# Patient Record
Sex: Male | Born: 1945
Health system: Southern US, Community
[De-identification: ages and names within clinical notes are randomized; demographics above are authoritative.]

## PROBLEM LIST (undated history)

## (undated) DIAGNOSIS — R0600 Dyspnea, unspecified: Secondary | ICD-10-CM

## (undated) DIAGNOSIS — Z720 Tobacco use: Secondary | ICD-10-CM

## (undated) DIAGNOSIS — C61 Malignant neoplasm of prostate: Secondary | ICD-10-CM

## (undated) DIAGNOSIS — N400 Enlarged prostate without lower urinary tract symptoms: Secondary | ICD-10-CM

## (undated) DIAGNOSIS — E7800 Pure hypercholesterolemia, unspecified: Secondary | ICD-10-CM

## (undated) DIAGNOSIS — K219 Gastro-esophageal reflux disease without esophagitis: Secondary | ICD-10-CM

## (undated) DIAGNOSIS — F1010 Alcohol abuse, uncomplicated: Secondary | ICD-10-CM

## (undated) DIAGNOSIS — C439 Malignant melanoma of skin, unspecified: Secondary | ICD-10-CM

## (undated) DIAGNOSIS — I209 Angina pectoris, unspecified: Secondary | ICD-10-CM

## (undated) DIAGNOSIS — D494 Neoplasm of unspecified behavior of bladder: Secondary | ICD-10-CM

## (undated) DIAGNOSIS — Z8739 Personal history of other diseases of the musculoskeletal system and connective tissue: Secondary | ICD-10-CM

## (undated) DIAGNOSIS — I219 Acute myocardial infarction, unspecified: Secondary | ICD-10-CM

## (undated) DIAGNOSIS — I10 Essential (primary) hypertension: Secondary | ICD-10-CM

## (undated) DIAGNOSIS — I2510 Atherosclerotic heart disease of native coronary artery without angina pectoris: Secondary | ICD-10-CM

## (undated) DIAGNOSIS — J449 Chronic obstructive pulmonary disease, unspecified: Secondary | ICD-10-CM

## (undated) HISTORY — PX: CORONARY ANGIOPLASTY: SHX604

## (undated) HISTORY — DX: Gastro-esophageal reflux disease without esophagitis: K21.9

## (undated) HISTORY — DX: Atherosclerotic heart disease of native coronary artery without angina pectoris: I25.10

## (undated) HISTORY — PX: CARDIAC SURGERY: SHX584

## (undated) HISTORY — PX: PROSTATE SURGERY: SHX751

## (undated) HISTORY — PX: CORONARY ARTERY BYPASS GRAFT: SHX141

## (undated) HISTORY — DX: Alcohol abuse, uncomplicated: F10.10

## (undated) HISTORY — DX: Malignant melanoma of skin, unspecified: C43.9

## (undated) HISTORY — DX: Essential (primary) hypertension: I10

## (undated) HISTORY — DX: Malignant neoplasm of prostate: C61

## (undated) HISTORY — DX: Tobacco use: Z72.0

## (undated) HISTORY — DX: Pure hypercholesterolemia, unspecified: E78.00

## (undated) HISTORY — DX: Acute myocardial infarction, unspecified: I21.9

## (undated) HISTORY — PX: BACK SURGERY: SHX140

## (undated) HISTORY — PX: COLONOSCOPY: SHX174

---

## 1998-01-20 ENCOUNTER — Ambulatory Visit (HOSPITAL_COMMUNITY): Admission: RE | Admit: 1998-01-20 | Discharge: 1998-01-20 | Payer: Self-pay | Admitting: Neurosurgery

## 1999-02-04 ENCOUNTER — Encounter: Payer: Self-pay | Admitting: Emergency Medicine

## 1999-02-04 ENCOUNTER — Inpatient Hospital Stay (HOSPITAL_COMMUNITY): Admission: EM | Admit: 1999-02-04 | Discharge: 1999-02-11 | Payer: Self-pay | Admitting: Emergency Medicine

## 1999-02-05 ENCOUNTER — Encounter: Payer: Self-pay | Admitting: Cardiology

## 1999-02-07 ENCOUNTER — Encounter: Payer: Self-pay | Admitting: Thoracic Surgery (Cardiothoracic Vascular Surgery)

## 1999-02-08 ENCOUNTER — Encounter: Payer: Self-pay | Admitting: Thoracic Surgery (Cardiothoracic Vascular Surgery)

## 1999-02-09 ENCOUNTER — Encounter: Payer: Self-pay | Admitting: Thoracic Surgery (Cardiothoracic Vascular Surgery)

## 2005-06-11 ENCOUNTER — Observation Stay (HOSPITAL_COMMUNITY): Admission: EM | Admit: 2005-06-11 | Discharge: 2005-06-12 | Payer: Self-pay | Admitting: Emergency Medicine

## 2006-11-08 ENCOUNTER — Ambulatory Visit: Payer: Self-pay | Admitting: Gastroenterology

## 2006-11-08 LAB — CONVERTED CEMR LAB
INR: 0.9 (ref 0.9–2.0)
Prothrombin Time: 11.7 s (ref 10.0–14.0)
aPTT: 35.7 s (ref 26.5–36.5)

## 2006-11-19 ENCOUNTER — Ambulatory Visit: Payer: Self-pay | Admitting: Gastroenterology

## 2007-02-04 ENCOUNTER — Ambulatory Visit (HOSPITAL_BASED_OUTPATIENT_CLINIC_OR_DEPARTMENT_OTHER): Admission: RE | Admit: 2007-02-04 | Discharge: 2007-02-04 | Payer: Self-pay | Admitting: Surgery

## 2007-02-04 ENCOUNTER — Encounter (INDEPENDENT_AMBULATORY_CARE_PROVIDER_SITE_OTHER): Payer: Self-pay | Admitting: Surgery

## 2008-03-03 ENCOUNTER — Ambulatory Visit (HOSPITAL_COMMUNITY): Admission: RE | Admit: 2008-03-03 | Discharge: 2008-03-03 | Payer: Self-pay | Admitting: Cardiology

## 2008-03-03 ENCOUNTER — Ambulatory Visit: Payer: Self-pay | Admitting: Cardiology

## 2008-04-01 ENCOUNTER — Ambulatory Visit: Payer: Self-pay | Admitting: Cardiology

## 2008-05-26 ENCOUNTER — Ambulatory Visit: Admission: RE | Admit: 2008-05-26 | Discharge: 2008-08-19 | Payer: Self-pay | Admitting: Radiation Oncology

## 2008-07-13 ENCOUNTER — Ambulatory Visit (HOSPITAL_BASED_OUTPATIENT_CLINIC_OR_DEPARTMENT_OTHER): Admission: RE | Admit: 2008-07-13 | Discharge: 2008-07-13 | Payer: Self-pay | Admitting: Urology

## 2008-10-06 ENCOUNTER — Encounter (HOSPITAL_COMMUNITY): Admission: RE | Admit: 2008-10-06 | Discharge: 2008-12-11 | Payer: Self-pay | Admitting: Family Medicine

## 2008-12-29 ENCOUNTER — Ambulatory Visit: Admission: RE | Admit: 2008-12-29 | Discharge: 2008-12-29 | Payer: Self-pay | Admitting: Radiation Oncology

## 2008-12-29 LAB — URINALYSIS, MICROSCOPIC - CHCC
Bilirubin (Urine): NEGATIVE
Blood: NEGATIVE
Glucose: NEGATIVE g/dL
Ketones: NEGATIVE mg/dL
Leukocyte Esterase: NEGATIVE
Nitrite: NEGATIVE
Protein: NEGATIVE mg/dL
RBC count: NEGATIVE (ref 0–2)
Specific Gravity, Urine: 1.005 (ref 1.003–1.035)
pH: 6 (ref 4.6–8.0)

## 2008-12-31 LAB — URINE CULTURE

## 2009-08-07 HISTORY — PX: CARDIAC CATHETERIZATION: SHX172

## 2009-11-23 ENCOUNTER — Inpatient Hospital Stay (HOSPITAL_COMMUNITY): Admission: EM | Admit: 2009-11-23 | Discharge: 2009-11-25 | Payer: Self-pay | Admitting: Emergency Medicine

## 2009-11-23 ENCOUNTER — Ambulatory Visit: Payer: Self-pay | Admitting: Cardiology

## 2009-12-03 DIAGNOSIS — E782 Mixed hyperlipidemia: Secondary | ICD-10-CM | POA: Insufficient documentation

## 2009-12-03 DIAGNOSIS — F17200 Nicotine dependence, unspecified, uncomplicated: Secondary | ICD-10-CM | POA: Insufficient documentation

## 2009-12-03 DIAGNOSIS — F1010 Alcohol abuse, uncomplicated: Secondary | ICD-10-CM | POA: Insufficient documentation

## 2009-12-03 DIAGNOSIS — Z720 Tobacco use: Secondary | ICD-10-CM | POA: Insufficient documentation

## 2009-12-03 DIAGNOSIS — E785 Hyperlipidemia, unspecified: Secondary | ICD-10-CM | POA: Insufficient documentation

## 2009-12-06 ENCOUNTER — Ambulatory Visit: Payer: Self-pay | Admitting: Cardiology

## 2009-12-06 DIAGNOSIS — I498 Other specified cardiac arrhythmias: Secondary | ICD-10-CM | POA: Insufficient documentation

## 2010-09-08 NOTE — Assessment & Plan Note (Signed)
Summary: EPH/ GD  Medications Added SIMCOR 750-20 MG XR24H-TAB (NIACIN-SIMVASTATIN) 1 tab at bedtime FLOMAX 0.4 MG CAPS (TAMSULOSIN HCL) 1 cap once daily INDOMETHACIN 50 MG CAPS (INDOMETHACIN) 1 cap three times a day      Allergies Added: NKDA  Visit Type:  EPH Primary Provider:  Ileana [name]  CC:  pt states he gets dizzy when he gets up...denies any other complaints today.  History of Present Illness: [name] comes in today for followup after being discharged the hospital.  He came in with chest pain with a history of an inferior [name] infarct and also coronary bypass grafting.  He ruled out for myocardial infarction. Cardiac catheterization showed patent grafts. He has good LV function.  Since discharge he had no further chest pain. He does get dizzy when he stands up. He is cut back his tobacco use and alcohol use significantly since discharge.  Clinical Reports Reviewed:  Cardiac Cath:  11/25/2009: Cardiac Cath Findings:   Left ventricular angiogram was performed in the RAO projection and       showed normal left ventricular systolic function with ejection       fraction estimated at 50%.  Mild mitral regurgitation was noted.   10.Aortic root angiogram was performed and did not show enlargement of       the aortic root.      IMPRESSION:   1. Severe triple-vessel coronary artery disease.   2. Patent bypass grafts 4/4.   3. Low normal left ventricular systolic function.      RECOMMENDATIONS:  I recommend continued medical management.  We should   also encourage complete smoking cessation.               Ralph Carrow, MD   06/12/2005: Cardiac Cath Findings:   The left ventricular angiography was performed in the RAO view. This  demonstrates normal left ventricular size. There is hypokinesia of the basal  inferior [name] with overall well-preserved left ventricular function.  Ejection fraction is estimated 55%. There is no mitral regurgitation   prolapse.   FINAL INTERPRETATION:  1.  Severe two-vessel obstructive atherosclerotic coronary disease.  2.  Continued excellent patency of all his bypass grafts.  3.  Good left ventricular function.   PLAN:  Based on these findings, I think that his chest pain is noncardiac.  Recommend continued medical therapy and smoking cessation.         ______________________________  [name], M.D.   Allergies (verified): No Known Drug Allergies  Past History:  Past Medical History: Last updated: 2009-12-13 CAD, ARTERY BYPASS GRAFT/ 2001 (ICD-414.04) MYOCARDIAL INFARCTION, INFERIOR [name]/ 1997 (ICD-410.40) HYPERLIPIDEMIA (ICD-272.4) HYPERTENSION (ICD-401.9) TOBACCO USER (ICD-305.1) ETHANOL ABUSE (ICD-305.00)    Past Surgical History: Last updated: 12/13/09 Prostate seed implantation and cystoscopy.  Internal hemorrhoidal banding at 12 o'clock.  Excision of 2 areas of external hemorrhoid skin tags.      Family History: Last updated: 12-13-2009  His father died at 73 of an MI.  Two brothers have   cardiac issues.   Social History: Last updated: 12-13-09  He just retired from [employer]  in January of this year.   He is married, has 2 children.  He smokes, he drinks as mentioned above.  Enjoys hunting.   Review of Systems       negative other than history of present illness  Vital Signs:  Patient profile:   65 year old male Height:      67 inches Weight:  157 pounds BMI:     24.68 Pulse rate:   48 / minute Pulse (ortho):   50 / minute Pulse rhythm:   irregular BP sitting:   118 / 60  (left arm) BP standing:   122 / 66 Cuff size:   large  Vitals Entered By: [name], CMA (Dec 06, 2009 3:45 PM)  Serial Vital Signs/Assessments:  Time      Position  BP       Pulse  Resp  Temp     By 3:56 PM   Lying LA  135/66   47                    [name], CMA 3:58 PM   Sitting   128/67   48                    [name], CMA 4:00 PM   Standing  122/66   50                     [name], CMA 4:02 PM   Standing  131/66   51                    [name], [place] 4:04 PM   Standing  129/65   51                    [name], [place]  Comments: 3:56 PM no sxms By: [name], CMA  3:58 PM no sxms By: [name], CMA  4:00 PM no sxms By: [name], CMA  4:02 PM no sxms By: [name], CMA  4:04 PM no sxms By: [name], CMA    Physical Exam  General:  looks older than stated age, pleasant, alert oriented x3 Head:  normocephalic and atraumatic Neck:  Neck supple, no JVD. No masses, thyromegaly or abnormal cervical nodes. Chest [name]:  no deformities or breast masses noted Lungs:  Clear bilaterally to auscultation and percussion. Heart:  Non-displaced PMI, chest non-tender; regular rate and rhythm, S1, S2 without murmurs, rubs or gallops. Carotid upstroke normal, no bruit. Normal abdominal aortic size, no bruits. Femorals normal pulses, no bruits. Pedals normal pulses. No edema, no varicosities. Msk:  Back normal, normal gait. Muscle strength and tone normal. Pulses:  pulses normal in all 4 extremities Extremities:  No clubbing or cyanosis. Neurologic:  Alert and oriented x 3. Skin:  Intact without lesions or rashes. Psych:  Normal affect.   Problems:  Medical Problems Added: 1)  Dx of Bradycardia  (ICD-427.89)  EKG  Procedure date:  12/06/2009  Findings:      sinus bradycardia, rate 48 beats per minute, previous inferior [name] infarct.  Impression & Recommendations:  Problem # 1:  CAD, ARTERY BYPASS GRAFT/ 2001 (ICD-414.04) Assessment Unchanged  It is remarkable for all his bypass grafts are open. Have encouraged him to cut back on his smoking or quit which I doubt he can and to cut back on his alcohol His updated medication list for this problem includes:    Aspirin 81 Mg Tbec (Aspirin) .Marland Kitchen... Take one tablet by mouth daily    Metoprolol Tartrate 50 Mg Tabs (Metoprolol tartrate) .Marland Kitchen... 1 tab two times a day     Ramipril 5 Mg Caps (Ramipril) .Marland Kitchen... 1 cap once daily  Orders: EKG w/ Interpretation (93000)  Problem # 2:  MYOCARDIAL INFARCTION, INFERIOR [name]/ 1997 (ICD-410.40) Assessment: Unchanged  His updated medication list for this problem includes:    Aspirin 81 Mg Tbec (Aspirin) .Marland Kitchen... Take one tablet by mouth daily    Metoprolol Tartrate 50 Mg Tabs (Metoprolol tartrate) .Marland Kitchen... 1 tab two times a day    Ramipril 5 Mg Caps (Ramipril) .Marland Kitchen... 1 cap once daily  Problem # 3:  HYPERLIPIDEMIA (ICD-272.4)  The following medications were removed from the medication list:    Niaspan 1000 Mg Cr-tabs (Niacin (antihyperlipidemic)) .Marland Kitchen... 1 1/2 tab at bedtime His updated medication list for this problem includes:    Simvastatin 40 Mg Tabs (Simvastatin) .Marland Kitchen... 1 tab at bedtime    Lovaza 1 Gm Caps (Omega-3-acid ethyl esters) .Marland Kitchen... 2 caps two times a day    Simcor 750-20 Mg Xr24h-tab (Niacin-simvastatin) .Marland Kitchen... 1 tab at bedtime  Problem # 4:  HYPERTENSION (ICD-401.9) Assessment: Improved  His updated medication list for this problem includes:    Aspirin 81 Mg Tbec (Aspirin) .Marland Kitchen... Take one tablet by mouth daily    Metoprolol Tartrate 50 Mg Tabs (Metoprolol tartrate) .Marland Kitchen... 1 tab two times a day    Ramipril 5 Mg Caps (Ramipril) .Marland Kitchen... 1 cap once daily  Problem # 5:  TOBACCO USER (ICD-305.1) Assessment: Improved  Problem # 6:  ETHANOL ABUSE (ICD-305.00) Assessment: Improved  Problem # 7:  BRADYCARDIA (ICD-427.89)  I have cut his metoprolol to 25 mg twice a day. I've also encouraged him to cut back on his alcohol and increase free water so that he won't be dizzy when he stands up. His updated medication list for this problem includes:    Aspirin 81 Mg Tbec (Aspirin) .Marland Kitchen... Take one tablet by mouth daily    Metoprolol Tartrate 50 Mg Tabs (Metoprolol tartrate) .Marland Kitchen... 1 tab two times a day    Ramipril 5 Mg Caps (Ramipril) .Marland Kitchen... 1 cap once daily  His updated medication list for this problem includes:    Aspirin 81  Mg Tbec (Aspirin) .Marland Kitchen... Take one tablet by mouth daily    Metoprolol Tartrate 50 Mg Tabs (Metoprolol tartrate) .Marland Kitchen... 1 tab two times a day    Ramipril 5 Mg Caps (Ramipril) .Marland Kitchen... 1 cap once daily  Patient Instructions: 1)  Your physician recommends that you schedule a follow-up appointment in: YEAR WITH DR Sachit Gilman 2)  Your physician has recommended you make the following change in your medication: DECREASE METOPROLOL TO 25 MG two times a day

## 2010-10-25 LAB — POCT CARDIAC MARKERS
CKMB, poc: <1 ng/mL — ABNORMAL LOW (ref 1.0–8.0)
Myoglobin, poc: 86.8 ng/mL (ref 12–200)
Troponin i, poc: <0.05 ng/mL (ref 0.00–0.09)

## 2010-10-25 LAB — APTT: aPTT: 33 seconds (ref 24–37)

## 2010-10-25 LAB — CK TOTAL AND CKMB (NOT AT ARMC)
CK, MB: 1.1 ng/mL (ref 0.3–4.0)
Relative Index: INVALID (ref 0.0–2.5)
Total CK: 66 U/L (ref 7–232)

## 2010-10-25 LAB — BASIC METABOLIC PANEL
BUN: 11 mg/dL (ref 6–23)
BUN: 9 mg/dL (ref 6–23)
CO2: 26 mEq/L (ref 19–32)
CO2: 28 mEq/L (ref 19–32)
Calcium: 10.2 mg/dL (ref 8.4–10.5)
Calcium: 9.5 mg/dL (ref 8.4–10.5)
Chloride: 104 mEq/L (ref 96–112)
Chloride: 104 mEq/L (ref 96–112)
Creatinine, Ser: 1.13 mg/dL (ref 0.4–1.5)
Creatinine, Ser: 1.34 mg/dL (ref 0.4–1.5)
GFR calc Af Amer: >60 mL/min (ref >60)
GFR calc Af Amer: >60 mL/min (ref >60)
GFR calc non Af Amer: 54 mL/min — ABNORMAL LOW (ref >60)
GFR calc non Af Amer: >60 mL/min (ref >60)
Glucose, Bld: 108 mg/dL — ABNORMAL HIGH (ref 70–99)
Glucose, Bld: 93 mg/dL (ref 70–99)
Potassium: 4.4 mEq/L (ref 3.5–5.1)
Potassium: 5.1 mEq/L (ref 3.5–5.1)
Sodium: 137 mEq/L (ref 135–145)
Sodium: 138 mEq/L (ref 135–145)

## 2010-10-25 LAB — PROTIME-INR
INR: 1.04 (ref 0.00–1.49)
Prothrombin Time: 13.5 seconds (ref 11.6–15.2)

## 2010-10-25 LAB — CBC
HCT: 36.1 % — ABNORMAL LOW (ref 39.0–52.0)
HCT: 36.6 % — ABNORMAL LOW (ref 39.0–52.0)
HCT: 40.2 % (ref 39.0–52.0)
Hemoglobin: 12.3 g/dL — ABNORMAL LOW (ref 13.0–17.0)
Hemoglobin: 12.6 g/dL — ABNORMAL LOW (ref 13.0–17.0)
Hemoglobin: 13.9 g/dL (ref 13.0–17.0)
MCHC: 34 g/dL (ref 30.0–36.0)
MCHC: 34.3 g/dL (ref 30.0–36.0)
MCHC: 34.6 g/dL (ref 30.0–36.0)
MCV: 100.1 fL — ABNORMAL HIGH (ref 78.0–100.0)
MCV: 99.6 fL (ref 78.0–100.0)
MCV: 99.7 fL (ref 78.0–100.0)
Platelets: 168 10*3/uL (ref 150–400)
Platelets: 181 10*3/uL (ref 150–400)
Platelets: 206 10*3/uL (ref 150–400)
RBC: 3.62 MIL/uL — ABNORMAL LOW (ref 4.22–5.81)
RBC: 3.65 MIL/uL — ABNORMAL LOW (ref 4.22–5.81)
RBC: 4.04 MIL/uL — ABNORMAL LOW (ref 4.22–5.81)
RDW: 13.3 % (ref 11.5–15.5)
RDW: 13.4 % (ref 11.5–15.5)
RDW: 13.4 % (ref 11.5–15.5)
WBC: 10 10*3/uL (ref 4.0–10.5)
WBC: 7.2 10*3/uL (ref 4.0–10.5)
WBC: 9.7 10*3/uL (ref 4.0–10.5)

## 2010-10-25 LAB — HEPARIN LEVEL (UNFRACTIONATED)
Heparin Unfractionated: 0.21 IU/mL — ABNORMAL LOW (ref 0.30–0.70)
Heparin Unfractionated: 0.23 IU/mL — ABNORMAL LOW (ref 0.30–0.70)

## 2010-10-25 LAB — TROPONIN I: Troponin I: 0.01 ng/mL (ref 0.00–0.06)

## 2010-10-25 LAB — CARDIAC PANEL(CRET KIN+CKTOT+MB+TROPI)
CK, MB: 0.9 ng/mL (ref 0.3–4.0)
CK, MB: 1 ng/mL (ref 0.3–4.0)
Relative Index: INVALID (ref 0.0–2.5)
Relative Index: INVALID (ref 0.0–2.5)
Total CK: 62 U/L (ref 7–232)
Total CK: 62 U/L (ref 7–232)
Troponin I: 0.01 ng/mL (ref 0.00–0.06)
Troponin I: 0.02 ng/mL (ref 0.00–0.06)

## 2010-10-25 LAB — DIFFERENTIAL
Basophils Absolute: 0 10*3/uL (ref 0.0–0.1)
Basophils Relative: 0 % (ref 0–1)
Eosinophils Absolute: 0 10*3/uL (ref 0.0–0.7)
Eosinophils Relative: 0 % (ref 0–5)
Lymphocytes Relative: 15 % (ref 12–46)
Lymphs Abs: 1.5 10*3/uL (ref 0.7–4.0)
Monocytes Absolute: 0.7 10*3/uL (ref 0.1–1.0)
Monocytes Relative: 7 % (ref 3–12)
Neutro Abs: 7.8 10*3/uL — ABNORMAL HIGH (ref 1.7–7.7)
Neutrophils Relative %: 78 % — ABNORMAL HIGH (ref 43–77)

## 2010-10-25 LAB — TSH: TSH: 0.595 u[IU]/mL (ref 0.350–4.500)

## 2010-12-20 NOTE — Letter (Signed)
March 03, 2008    Ernestina Penna, M.D.  [address] [place], [place] [address]   RE:  [name], [name]  MRN:  [id number]  /  DOB:  July 28, 1946   Dear Roe Coombs,   [name] was seen in the office today in consultation at your request  for coronary artery disease.  As you know, this nice gentleman underwent  percutaneous intervention for an acute inferior myocardial infarction in  1997, but subsequently required CABG surgery in 2001.  He was previously  under the care of Dr. Peter Swaziland.  He required repeat coronary  angiography in 2006 at which time he was found to have total obstruction  of the mid LAD, total obstruction of the mid right coronary, and  scattered other lesions.  Saphenous vein grafts to the PDA, first  marginal and first diagonal were patent as was the LIMA graft to the  LAD.  He had basilar inferior hypokinesis with normal overall left  ventricular systolic function.   He reports no chest discomfort.  He does have exertional dyspnea when  going up a hill.  He believes this has been progressive in recent years,  but attributes it to cigarette smoking.  He has a 40-pack-a-year total  consumption, discontinued for a few years, but has resumed more  recently.  He has hyperlipidemia that [name] is controlling  aggressively.  Hypertension is adequately controlled.   Current medications include:  1. Aspirin 81 mg daily.  2. Metoprolol 50 mg b.i.d.  3. Simvastatin/Niaspan 1500/40 mg daily.  4. Lovaza 2 tablets b.i.d.  5. Ramipril 5 mg daily.   [name] has no known medical allergies.   SOCIAL HISTORY:  He works at [employer]; enjoys hunting; married, with 2  adult children.  No excessive use of alcohol.   FAMILY HISTORY:  Father died at age 29 due to myocardial infarction.  Mother had diabetes and died at a young age.  Two brothers both have  cardiac problems.   REVIEW OF SYSTEMS:  Notable for the need for corrective lenses for near  vision, developing cataracts, mild  hearing loss, upper dentures, and  intermittent gout.  All other systems reviewed and are negative.   PHYSICAL EXAMINATION:  GENERAL:  A pleasant gentleman in no acute  distress.  VITAL SIGNS:  The weight is 143, blood pressure 130/70, heart rate 55  and regular, and respirations 14.  HEENT:  Anicteric sclerae; normal lids and conjunctivae; normal oral  mucosa.  NECK:  No jugular venous distention; normal carotid upstrokes without  bruits.  LUNGS:  Inspiratory and expiratory rhonchi; somewhat prolonged  expiratory phase.  CARDIAC:  Normal first and second heart sounds.  ABDOMEN:  Soft and nontender; aortic pulsation not palpable; no bruits.  EXTREMITIES:  Distal pulses intact; no edema.   EKG:  Sinus bradycardia; left atrial abnormality; probable prior  inferior myocardial infarction; low voltage in the limb leads; prominent  voltage in the chest leads; delayed R-wave progression.  No prior  tracing for comparison.   [name] is doing well 8 years following CABG surgery.  Progressive  dyspnea merits further investigation.  We will perform a graded exercise  test with oximetric monitoring.  Control of hypertension is good, but  could always be better.  Ralph dose of ramipril will be increased to 10 mg  daily.  I do not have Ralph most recent lipid profile, but will leave  adjustment of medication to your discretion.  If cost becomes an  issue,  he could be switched to generic simvastatin and over-the-counter niacin.   Both [name] and [name] were strongly encouraged to discontinue  cigarette smoking.  He used chewing tobacco in the past as nicotine  replacement therapy.  I suggested the patch.  Assuming he does well on  Ralph stress test, I will plan to see this nice gentleman again in 6  months.    Sincerely,     Ralph Friends. Dietrich Pates, MD, [facility]  Electronically Signed   RMR/MedQ  DD: 03/03/2008  DT: 03/04/2008  Job #: [id number]

## 2010-12-20 NOTE — Op Note (Signed)
[name], [name]                 ACCOUNT NO.:  [id number]   MEDICAL RECORD NO.:  [id number]          PATIENT TYPE:  AMB   LOCATION:  [facility]                         FACILITY:  [facility]   PHYSICIAN:  Excell Seltzer. Annabell Howells, M.D.    DATE OF BIRTH:  May 07, 1946   DATE OF PROCEDURE:  07/13/2008  DATE OF DISCHARGE:                               OPERATIVE REPORT   PROCEDURE:  Prostate seed implantation and cystoscopy.   PREOPERATIVE DIAGNOSIS:  Stage T1c Gleason's 6 adenocarcinoma of the  prostate.   POSTOPERATIVE DIAGNOSIS:  Stage T1c Gleason's 6 adenocarcinoma of the  prostate.   SURGEON:  Excell Seltzer. Annabell Howells, M.D.   RADIATION ONCOLOGIST:  Billie Lade, M.D.   ANESTHESIA:  General.   DRAINS:  Foley catheter.   COMPLICATIONS:  None.   INDICATIONS:  [name] is a 65 year old white male with a Gleason's 6  prostate cancer who has elected to undergo seed implantation.  His  prostate volume is 22 mL.  He had low risk disease and felt to be a good  candidate for seeds   FINDINGS AND PROCEDURE:  The patient was given Cipro, was taken  operating room where he was fitted with PAS hose.  A general anesthetic  was induced.  He was placed in the lithotomy position.  His genitalia  was prepped with Betadine solution.  A Foley catheter was inserted.  The  balloon was filled with dilute contrast and an OpSite was used to  reflect the scrotum superiorly.  The patient's perineum was prepped with  Betadine solution.  The ultrasound grid was placed.  Stabilization  needles were placed E4 and H4 and then the intraoperative plan was  performed.  The perineum was clipped. A red rubber rectal catheter was  placed.  The ultrasound probe was assembled and inserted and the  intraoperative plan was performed by Dr. Roselind Messier and radiation physicist,  Burna Mortimer.  .  The intraoperative plan was performed on then nucleotron  device Dr. Roselind Messier and the radiation physicist.  Once the band plan had  been completed, I then returned  and placed the needles per the  prearranged plan.  A total of 26 needles with 74 seeds of iodine 125 was  used.  Once the implant had been completed,  the ultrasound probe was  removed and fluoroscopy was used to take images of the implant.  The  Foley catheter was then removed, the genitalia was reprepped.  Cystoscopy was performed with a 16-French flexible scope.  Examination  revealed a normal urethra.  The external sphincter was intact.  The  prostatic urethra short with bilobar hyperplasia with minimal  obstruction.  Examination of bladder revealed minimal trabeculation.  No  tumors or stones were noted.  No seeds were seen.  No clots were seen.  There was no active bleeding.  At this point the cystoscope was removed  and a fresh Foley catheter was inserted.  The balloon was filled with 10  mL sterile fluid and the catheter was placed to  straight drainage.  The patient's perineum was cleansed and dressed in  the red rubber rectal catheter was removed.  He was taken down from  lithotomy position.  His anesthetic was reversed.  He was removed to the  recovery room in stable condition and no complications.      Excell Seltzer. Annabell Howells, M.D.  Electronically Signed     JJW/MEDQ  D:  07/13/2008  T:  07/13/2008  Job:  [id number]   cc:   Ernestina Penna, M.D.  Fax: [phone]   Billie Lade, M.D.  Fax: [phone]   Gerrit Friends. Dietrich Pates, MD, [facility]  [address]  [place], [place] [address]

## 2010-12-20 NOTE — Op Note (Signed)
[name], [name]                 ACCOUNT NO.:  [id number]   MEDICAL RECORD NO.:  [id number]          PATIENT TYPE:  AMB   LOCATION:  [facility]                          FACILITY:  [facility]   PHYSICIAN:  Thornton Park. Daphine Deutscher, MD  DATE OF BIRTH:  11-11-1945   DATE OF PROCEDURE:  02/04/2007  DATE OF DISCHARGE:                               OPERATIVE REPORT   PREOPERATIVE DIAGNOSIS:  Bleeding hemorrhoids.   POSTOPERATIVE DIAGNOSIS:  Large internal hemorrhoid at 12 o'clock with  the patient in the supine position, likely source of bleeding, with  external hemorrhoids skin tag.   PROCEDURE:  1. Exam under anesthesia in the dorsal lithotomy position.  2. Internal hemorrhoidal banding at 12 o'clock.  3. Excision of 2 areas of external hemorrhoid skin tags.   SURGEON:  Thornton Park. Daphine Deutscher, MD   ANESTHESIA:  General.   DESCRIPTION OF PROCEDURE:  [name] was taken back to room #3 on February 04, 2007 and placed in the dorsal lithotomy position.  His perianal  region was prepped Techni-Care on the outside and on the inside and  draped sterilely.  With him in Valsalva, he had a very large strawberry-  like hemorrhoid in the 12 o'clock position which I was able to expose  easily and double-band internally; this caused marked regression of that  column.  I did find skin tags, however, which I then excised with a  Bovie, both at the 11 o'clock position and at the 4 o'clock position.  I  went ahead and controlled bleeding with the electrocautery.  I massaged  Betadine ointment into these areas.  The perianal region looked smooth  at the end and no other significant hemorrhoidal disease could be  identified at this point.  I injected the sphincter muscle with 20 mL of  0.25% Marcaine with epinephrine.  The patient was awakened and taken to  the recovery room in satisfactory condition.  He was given a  prescription for Tylox to take if needed for pain and he will be  followed up in the office in about 2-3  weeks.      Thornton Park Daphine Deutscher, MD  Electronically Signed     MBM/MEDQ  D:  02/04/2007  T:  02/05/2007  Job:  [id number]   cc:   Alfredia Client, MD

## 2010-12-23 NOTE — Discharge Summary (Signed)
[name], [name]                 ACCOUNT NO.:  [id number]   MEDICAL RECORD NO.:  [id number]          PATIENT TYPE:  INP   LOCATION:  3743                         FACILITY:  [facility]   PHYSICIAN:  [name], M.D.  DATE OF BIRTH:  10-06-1945   DATE OF ADMISSION:  06/11/2005  DATE OF DISCHARGE:  06/12/2005                                 DISCHARGE SUMMARY   HISTORY OF PRESENT ILLNESS:  [name] is a 65 year old white male with  history of prior inferolateral myocardial infarction 1997 treated with  direct angioplasty of the left circumflex coronary artery. He subsequently  had coronary bypass surgery in 2001. He presents with increasing symptoms of  chest pain. His pain was midsternal and seems to be worse with deep  breathing or movement but he also states it is similar to his prior heart  attack pain. The patient does continue to smoke. He has a history of  hypercholesterolemia and family history of coronary disease. For details of  his past medical history, social history, family history, physical exam,  please see admission history and physical.   LABORATORY:  His chest x-ray showed COPD without active disease. A pH 7.42,  pCO2 of 34, bicarbonate of 23, white count 6100, hemoglobin 12.1, hematocrit  36.6, platelets 253,000. Coags were normal. Sodium 138, potassium 3.7,  chloride 109, CO2 25, glucose of 84, BUN 5, creatinine was 1.1. LFT's were  all normal. Calcium was normal. Serial cardiac enzymes were negative x3.  Cholesterol was 200, triglyceride level 456, HDL 40. LDL could not be  calculated. This was a nonfasting sample. ECG showed evidence old inferior  infarction without acute change.   HOSPITAL COURSE:  The patient is admitted to telemetry. He was placed on IV  heparin. His chest pain resolved. It was noted the patient had recently been  doing heavy yard work including cutting and splitting wood and carrying  wood. Because of his symptoms, he did undergo cardiac  catheterization on  June 12, 2005. This showed severe two-vessel coronary disease with  occlusion of the mid-LAD and the mid-right coronary. All his grafts were  patent including saphenous vein graft to the PDA, saphenous vein graft to  the first obtuse marginal vessel,  saphenous vein graft to the first  diagonal branch, and LIMA graft to LAD. He had focal inferior wall  hypokinesia with overall ejection fraction of approximately 55%. Based on  these findings, we could not ascribe a cardiac cause to his symptoms. I  suspect his symptoms were predominant musculoskeletal due to his recent  straining. I have recommended use of nonsteroidal anti-inflammatory agents  and rest at this time. The patient was discharged the day of his cardiac  catheterization in stable condition.   Discharge medications remained the same. He will be on Advicor 20/1000  milligrams per day, Indocin 50 milligrams twice a day, Toprol XL 50  milligrams twice a day, aspirin 81 milligrams per day, nitroglycerin p.r.n.   DISCHARGE DIAGNOSES:  1.  Chest pain. Musculoskeletal.  2.  Arteriosclerotic cardiovascular disease with remote inferior myocardial  infarction. Status post coronary artery bypass graft.  3.  Hyperlipidemia.  4.  Hypertension.  5.  Gout.   The patient is to avoid heavy lifting or straining for one week. He will  follow up Dr. Swaziland in two weeks. Discharge status is improved.           ______________________________  [name], M.D.     PMJ/MEDQ  D:  06/12/2005  T:  06/12/2005  Job:  [id number]   cc:   Ralph Lor, MD  Fax: [phone]

## 2010-12-23 NOTE — Cardiovascular Report (Signed)
[name], [name]                 ACCOUNT NO.:  [id number]   MEDICAL RECORD NO.:  [id number]          PATIENT TYPE:  INP   LOCATION:  3743                         FACILITY:  [facility]   PHYSICIAN:  [name], M.D.  DATE OF BIRTH:  1946/06/02   DATE OF PROCEDURE:  06/12/2005  DATE OF DISCHARGE:  06/12/2005                              CARDIAC CATHETERIZATION   INDICATIONS FOR PROCEDURE:  Mr. Wrightsman is a 65 year old white male with  history of coronary artery disease. He had prior inferior lateral myocardial  infarction in 1997 treated with angioplasty of the left circumflex coronary.  He had subsequent coronary bypass surgery x4 in 2001. He has a history of  hypertension, hyperlipidemia, family history of coronary disease and history  of tobacco abuse. He presented at this time with increasing symptoms of  substernal chest pain.   ACCESS:  Via the right femoral artery using standard Seldinger technique.   PROCEDURES:  Left heart catheterization, coronary left ventricular  angiography, saphenous vein graft angiography x3. LIMA graft angiography and  angiography of the left subclavian artery.   EQUIPMENT USED:  6-French 4 cm right and left Judkins catheter, 6-French  pigtail catheter, 6-French arterial sheath.   CONTRAST:  145 cc of Omnipaque.   MEDICATIONS:  Versed 2 milligrams IV, local anesthesia 1% Xylocaine.   HEMODYNAMIC DATA:  Aortic pressure was 135/69 with mean of 98 mmHg. Left  ventricle pressure is 132 with EDP of 19 mmHg.   ANGIOGRAPHIC DATA:  Left coronary arises and distributes normally. Left main  coronary is mildly calcified and appears normal.   The left anterior descending artery is diffusely diseased throughout the  proximal vessel up to 70%. It was occluded after the first septal  perforator.   Left circumflex coronary is a codominant vessel. It gives rise to a single  marginal vessel and then terminates with two moderate-sized posterolateral  branches.  The proximal circumflex has diffuse 30-40% stenosis.   The right coronary arises and distributes normally. It is diffusely  diseased. It is occluded in the midvessel with faint bridging collaterals.   The saphenous vein graft to the PDA is widely patent with excellent runoff.   The saphenous vein graft to the first obtuse marginal vessels is widely  patent with excellent runoff.   The saphenous vein graft to the first diagonal branch is widely patent with  good runoff.   The left subclavian artery is widely patent without obstruction.   The LIMA graft to the LAD is widely patent with excellent runoff.   The left ventricular angiography was performed in the RAO view. This  demonstrates normal left ventricular size. There is hypokinesia of the basal  inferior wall with overall well-preserved left ventricular function.  Ejection fraction is estimated 55%. There is no mitral regurgitation  prolapse.   FINAL INTERPRETATION:  1.  Severe two-vessel obstructive atherosclerotic coronary disease.  2.  Continued excellent patency of all his bypass grafts.  3.  Good left ventricular function.   PLAN:  Based on these findings, I think that his chest pain is noncardiac.  Recommend  continued medical therapy and smoking cessation.           ______________________________  [name], M.D.     PMJ/MEDQ  D:  06/12/2005  T:  06/12/2005  Job:  [id number]   cc:   Franklyn Lor, MD  Fax: [phone]

## 2010-12-23 NOTE — H&P (Signed)
NAMECAMIL, [name]                 ACCOUNT NO.:  [id number]   MEDICAL RECORD NO.:  [id number]          PATIENT TYPE:  EMS   LOCATION:  MAJO                         FACILITY:  [facility]   PHYSICIAN:  Georga Hacking, M.D.DATE OF BIRTH:  1946-03-11   DATE OF ADMISSION:  06/11/2005  DATE OF DISCHARGE:                                HISTORY & PHYSICAL   REASON FOR ADMISSION:  Chest pain.   HISTORY:  A 65 year old male with known coronary artery disease, who has a  previous history of an inferior infarction, previous bypass grafting in 2001  may have had percutaneous intervention prior to that, but is unclear of  details. He had gone back to smoking about a year ago, but has previously  been in reasonable health. He had the onset around three weeks ago of some  nausea and some dizziness, and then this evening had the onset of midsternal  chest discomfort associated with severe shortness of breath after using a  chain saw. The pain was severe and lasted one hour and described as a cinder  block on his chest.  EMS was called and he was given nitroglycerin which  relieved his pain prior to coming here. He is currently pain free. He had  another episode of chest discomfort last evening again described as pressure  associated with shortness of breath that resolved. He does not have  exertional type angina.   His past history is remarkable for hypertension, gout, and hyperlipidemia.  He has a history of questionable rectal bleeding in the past. Previous  surgery includes bypass grafting.   ALLERGIES:  None.   CURRENT MEDICATIONS:  1.  Advicor 20/1000 daily.  2.  Indocin 50 b.i.d.  3.  Toprol 50 b.i.d.  4.  Aspirin daily.   FAMILY HISTORY:  Father died of old age. Mother died of diabetes at age 53.  Two brothers have had coronary artery disease. One has had bypass grafting  and the other has had multiple interventions. The sister is alive and well.   SOCIAL HISTORY:  Is married and has a  son and a daughter. He works as a  Film/video editor. He smokes a pack of cigarettes a day, but  state he had quit for 3 years after surgery. He drinks a six-pack of alcohol  per day.   REVIEW OF SYSTEMS:  Weight has been stable. No skin disorder. No eyes, ears,  nose, or throat problems. No difficulty swallowing. Complains of some rectal  bleeding that has been present on and off all his life and attributed to  hemorrhoids. No history of ulcers. No history of GU problems or impotence.  No arthritis. He does have gout and has been taking Indocin however for gout  involving his great toe. No claudication. No TIA or stroke. Other than noted  above the remainder of the review of systems is unremarkable.   PHYSICAL EXAMINATION:  GENERAL: He is a pleasant male appearing in no acute  distress.  VITAL SIGNS: Blood pressure is 130/70, pulse 60, respirations 12.  SKIN: Warm and dry.  HEENT:  EOMI. PERRLA. CNS clear. Fundi not examined. Pharynx negative.  NECK: Supple. No masses, JVD, thyromegaly, or bruits.  LUNGS: Clear to A&P.  CARDIAC: Normal S1 and S2. No S3, S4, or murmur. Healed median sternotomy  scar noted.  ABDOMEN: Soft, nontender. No masses, hepatosplenomegaly, or aneurysm.  EXTREMITIES: __________ distal pulses 2+, healed saphenous vein harvesting  scar noted on the right leg.  NEUROLOGIC: Normal.   EKG shows a previous inferior infarction with no acute change. Initial CBC  and chemistry panel were normal. Initial enzymes were negative.   IMPRESSION:  1.  Chest pain consistent with unstable angina.  2.  Coronary artery disease with previous bypass grafting and possible      previous inferior infarction.  3.  Hyperlipidemia, under treatment.  4.  Cigarette abuse.  5.  Gout.  6.  Hypertension.   RECOMMENDATIONS:  Admit to Dr. Swaziland. Check serial enzymes. Begin beta  blocker, aspirin, IV heparin, and further workup with Dr. Swaziland. Likely  will need cardiac  catheterization. Keep NPO after midnight as a result.      Georga Hacking, M.D.  Electronically Signed     WST/MEDQ  D:  06/11/2005  T:  06/11/2005  Job:  [id number]   cc:   [name], M.D.  Fax: [phone]   Dr. Morrie Sheldon  [place] [facility]

## 2010-12-23 NOTE — Assessment & Plan Note (Signed)
[facility]                         GASTROENTEROLOGY OFFICE NOTE   NAME:[name], [name]                        MRN:          [id number]  DATE:11/08/2006                            DOB:          1946/08/06    [name] is a 65 year old white male self referred for evaluation of  recurrent rectal bleeding.   [name] has had intermittent rectal bleeding for several years but it  has been worse over the last several months. He does have some rectal  itching and denies rectal or abdominal pain, change in bowel habits,  anorexia, weight loss, or any upper GI, or hepatobiliary complaints. He  has not had previous endoscopic exams of his bowels or barium studies.  He had blood work done at Dr. Nelly Laurence office yesterday and this is  currently pending. He denies any symptoms of anemia. He is not on any  anticoagulants but does take regular aspirin.   PAST MEDICAL HISTORY:  Remarkable for coronary artery disease with  coronary bypass grafting in 2000. He has chronic hypertension, and  hypercholesterolemia. He apparently did have some hemorrhoid surgery in  1992, additionally. He sufferers from gouty arthritis, from an attack 2  months ago.   He is on;  1. Colchicine 0.6 mg p.r.n.  2. Metoprolol 50 mg twice a day.  3. Advicor daily.  4. An 81 mg aspirin tablet.  5. Indocin every 6 hours p.r.n.   FAMILY HISTORY:  Remarkable for arthroscleroses in his father and 2  brothers. There are no known gastrointestinal problems.   Father had some unknown bleeding disorder.   SOCIAL HISTORY:  He is married and lives with his wife. He works for  [employer] as a Charity fundraiser. He has a 9th grade education. He continues  to smoke, chews tobacco, uses ethanol regularly.   REVIEW OF SYSTEMS:  Frequent for nonproductive cough, some chronic  deafness, and shortness of breath on exertion. He denies other  cardiovascular or pulmonary complaints.   PHYSICAL EXAMINATION:   Shows him to be a healthy appearing white male in  no acute distress, appearing his stated age. He is 5 feet 7 inches tall  and weighs 158 pounds. Blood pressure was 160/92 and pulse was 68 and  regular. I could not appreciate stigmata of chronic liver disease or  thyromegaly.  CHEST: Clear. He was in a regular rhythm without significant murmurs,  gallops, or rubs at this time.  I could not appreciate hepatosplenomegaly, abdominal masses or  tenderness. Bowel sounds were normal.  Peripheral extremities were unremarkable.  MENTAL STATUS: Clear.  Inspection of the rectum: Shows external hemorrhoids and a prominent  posterior skin tag.  RECTAL EXAM: Showed no masses or tenderness with formed stool that was  guaiac negative.   ASSESSMENT:  1. Rectal bleeding probably from external hemorrhoids/ rule out      colonic polyps.  2. Coronary artery disease with previous bypass grafting and strong      family history of arthrosclerosis.  3. History of hyperlipidemia, but the patient for some reason is not      on a  statin medication at this time.  4. Gouty arthritis with probable need for allopurinol prescription.  5. History of chronic tobacco abuse.   RECOMMENDATIONS:  1. Outpatient colonoscopy at his convenience.  2. Treatment for hemorrhoids with sitz baths at bedtime and xyralid LP      cream and salve locally.  3. Primary care follow up with Dr. Morrie Sheldon.     Vania Rea. Jarold Motto, MD, Caleen Essex, FAGA  Electronically Signed    DRP/MedQ  DD: 11/08/2006  DT: 11/08/2006  Job #: [id number]

## 2011-05-09 LAB — COMPREHENSIVE METABOLIC PANEL
ALT: 29 U/L (ref 0–53)
AST: 40 U/L — ABNORMAL HIGH (ref 0–37)
Albumin: 3.9 g/dL (ref 3.5–5.2)
Alkaline Phosphatase: 90 U/L (ref 39–117)
BUN: 17 mg/dL (ref 6–23)
CO2: 24 mEq/L (ref 19–32)
Calcium: 10 mg/dL (ref 8.4–10.5)
Chloride: 106 mEq/L (ref 96–112)
Creatinine, Ser: 1.17 mg/dL (ref 0.4–1.5)
GFR calc Af Amer: >60 mL/min (ref >60)
GFR calc non Af Amer: >60 mL/min (ref >60)
Glucose, Bld: 91 mg/dL (ref 70–99)
Potassium: 5.7 mEq/L — ABNORMAL HIGH (ref 3.5–5.1)
Sodium: 140 mEq/L (ref 135–145)
Total Bilirubin: 0.8 mg/dL (ref 0.3–1.2)
Total Protein: 7.3 g/dL (ref 6.0–8.3)

## 2011-05-09 LAB — CBC
HCT: 42.5 % (ref 39.0–52.0)
Hemoglobin: 14.3 g/dL (ref 13.0–17.0)
MCHC: 33.5 g/dL (ref 30.0–36.0)
MCV: 102 fL — ABNORMAL HIGH (ref 78.0–100.0)
Platelets: 265 10*3/uL (ref 150–400)
RBC: 4.17 MIL/uL — ABNORMAL LOW (ref 4.22–5.81)
RDW: 13.7 % (ref 11.5–15.5)
WBC: 5.9 10*3/uL (ref 4.0–10.5)

## 2011-05-09 LAB — PROTIME-INR
INR: 0.9 (ref 0.00–1.49)
Prothrombin Time: 12.3 seconds (ref 11.6–15.2)

## 2011-05-09 LAB — APTT: aPTT: 39 seconds — ABNORMAL HIGH (ref 24–37)

## 2011-05-12 LAB — POCT I-STAT, CHEM 8
BUN: 15 mg/dL (ref 6–23)
Calcium, Ion: 1.26 mmol/L (ref 1.12–1.32)
Chloride: 103 mEq/L (ref 96–112)
Creatinine, Ser: 0.9 mg/dL (ref 0.4–1.5)
Glucose, Bld: 101 mg/dL — ABNORMAL HIGH (ref 70–99)
HCT: 42 % (ref 39.0–52.0)
Hemoglobin: 14.3 g/dL (ref 13.0–17.0)
Potassium: 4.7 mEq/L (ref 3.5–5.1)
Sodium: 140 mEq/L (ref 135–145)
TCO2: 29 mmol/L (ref 0–100)

## 2011-05-24 LAB — COMPREHENSIVE METABOLIC PANEL
ALT: 25
AST: 30
Albumin: 3.9
Alkaline Phosphatase: 86
BUN: 11
CO2: 26
Calcium: 9.1
Chloride: 102
Creatinine, Ser: 0.81
GFR calc Af Amer: >60
GFR calc non Af Amer: >60
Glucose, Bld: 96
Potassium: 4
Sodium: 135
Total Bilirubin: 1.2
Total Protein: 7.6

## 2011-05-24 LAB — POCT HEMOGLOBIN-HEMACUE
Hemoglobin: 12.7 — ABNORMAL LOW
Operator id: 208731

## 2011-08-08 DIAGNOSIS — I219 Acute myocardial infarction, unspecified: Secondary | ICD-10-CM

## 2011-08-08 HISTORY — DX: Acute myocardial infarction, unspecified: I21.9

## 2012-05-01 ENCOUNTER — Encounter: Payer: Self-pay | Admitting: Gastroenterology

## 2012-05-01 ENCOUNTER — Ambulatory Visit (INDEPENDENT_AMBULATORY_CARE_PROVIDER_SITE_OTHER): Payer: Self-pay | Admitting: Gastroenterology

## 2012-05-01 VITALS — BP 132/70 | HR 67 | Temp 98.4°F | Ht 66.0 in | Wt 160.2 lb

## 2012-05-01 DIAGNOSIS — R1310 Dysphagia, unspecified: Secondary | ICD-10-CM

## 2012-05-01 MED ORDER — OMEPRAZOLE 20 MG PO CPDR: 20.0000 mg | DELAYED_RELEASE_CAPSULE | Freq: Every day | ORAL | Status: DC

## 2012-05-01 NOTE — Progress Notes (Signed)
Primary Care Physician:  Rochelle Muse, PA/Dr. Kevin Howard Primary Gastroenterologist:  Dr. Rourk   Chief Complaint  Patient presents with  . Dysphagia    HPI:   [name] referred by Rochelle Muse, PA, due to dysphagia. Pt reports chronic sore throat. Worsening over past 6 months with intermittent solid food dysphagia. No wt loss, lack of appetite. No N/V. +heartburn. Taking wife's Prilosec for past 2 days. Denies rectal bleeding, abdominal pain.    Last colonoscopy remote past by Dr. Patterson. Requesting records. No lower GI issues currently.   Past Medical History  Diagnosis Date  . Prostate cancer   . Hypertension   . CAD (coronary artery disease)   . Myocardial infarction     X2  . Hypercholesterolemia     Past Surgical History  Procedure Date  . Prostate surgery   . Cardiac surgery   . Colonoscopy     in remote past, Dr. Patterson. Obtaining records.     Current Outpatient Prescriptions  Medication Sig Dispense Refill  . aspirin 81 MG tablet Take 81 mg by mouth daily.      . lisinopril (PRINIVIL,ZESTRIL) 10 MG tablet Take 10 mg by mouth daily.      . metoprolol succinate (TOPROL-XL) 50 MG 24 hr tablet Take 50 mg by mouth 2 (two) times daily. Take with or immediately following a meal.      . niacin-simvastatin (SIMCOR) 1000-20 MG 24 hr tablet Take 1 tablet by mouth at bedtime.      . omeprazole (PRILOSEC) 20 MG capsule Take 1 capsule (20 mg total) by mouth daily.  30 capsule  3    Allergies as of 05/01/2012  . (No Known Allergies)    Family History  Problem Relation Age of Onset  . Colon cancer Neg Hx     History   Social History  . Marital Status: Married    Spouse Name: N/A    Number of Children: N/A  . Years of Education: N/A   Occupational History  . Not on file.   Social History Main Topics  . Smoking status: Current Every Day Smoker -- 1.0 packs/day    Types: Cigarettes  . Smokeless tobacco: Not on file  . Alcohol Use: Yes   2-3 beers daily  . Drug Use: No  . Sexually Active: Not on file   Other Topics Concern  . Not on file   Social History Narrative  . No narrative on file    Review of Systems: Gen: Denies any fever, chills, fatigue, weight loss, lack of appetite.  CV: Denies chest pain, heart palpitations, peripheral edema, syncope.  Resp: Denies shortness of breath at rest or with exertion. Denies wheezing or cough.  GI: SEE HPI GU : Denies urinary burning, urinary frequency, urinary hesitancy MS: Denies joint pain, muscle weakness, cramps, or limitation of movement.  Derm: Denies rash, itching, dry skin Psych: Denies depression, anxiety, memory loss, and confusion Heme: Denies bruising, bleeding, and enlarged lymph nodes.  Physical Exam: BP 132/70  Pulse 67  Temp 98.4 F (36.9 C) (Temporal)  Ht 5' 6" (1.676 m)  Wt 160 lb 3.2 oz (72.666 kg)  BMI 25.86 kg/m2 General:   Alert and oriented. Pleasant and cooperative. Well-nourished and well-developed.  Head:  Normocephalic and atraumatic. Eyes:  Without icterus, sclera clear and conjunctiva pink.  Ears:  Normal auditory acuity. Nose:  No deformity, discharge,  or lesions. Mouth:  No deformity or lesions, oral mucosa pink.  Neck:  Supple, without   mass or thyromegaly. Lungs:  Clear to auscultation bilaterally. No wheezes, rales, or rhonchi. No distress.  Heart:  S1, S2 present without murmurs appreciated.  Abdomen:  +BS, soft, non-tender and non-distended. No HSM noted. No guarding or rebound. No masses appreciated.  Rectal:  Deferred  Msk:  Symmetrical without gross deformities. Normal posture. Extremities:  Without clubbing or edema. Neurologic:  Alert and  oriented x4;  grossly normal neurologically. Skin:  Intact without significant lesions or rashes. Cervical Nodes:  No significant cervical adenopathy. Psych:  Alert and cooperative. Normal mood and affect.    

## 2012-05-01 NOTE — Patient Instructions (Addendum)
We have set you up for an upper endoscopy with Dr. Jena Gauss in the near future.   Start taking Prilosec one daily, 30 minutes before breakfast.   Further recommendations to follow.

## 2012-05-02 ENCOUNTER — Encounter: Payer: Self-pay | Admitting: Gastroenterology

## 2012-05-02 DIAGNOSIS — R1310 Dysphagia, unspecified: Secondary | ICD-10-CM | POA: Insufficient documentation

## 2012-05-02 NOTE — Assessment & Plan Note (Signed)
66 year old male with worsening solid food dysphagia and chronic "sore throat". +reflux symptoms without PPI currently prescribed. Concern for esophageal web, ring, or stricture. Possibly symptoms r/t uncontrolled GERD. Less likely occult malignancy. Needs upper GI evaluation for further assessment.   Proceed with upper endoscopy in the near future with Dr. Jena Gauss. The risks, benefits, and alternatives have been discussed in detail with patient. They have stated understanding and desire to proceed.  PHENERGAN 25 MG IV ON CALL DUE TO HX OF ETOH USE Prilosec 20 mg daily sent to pharmacy  As of note, requesting last colonoscopy report, done by Dr. Jarold Motto in remote past. No lower GI symptoms currently.

## 2012-05-06 NOTE — Progress Notes (Signed)
Faxed to PCP

## 2012-05-22 ENCOUNTER — Encounter (HOSPITAL_COMMUNITY): Payer: Self-pay

## 2012-05-27 ENCOUNTER — Ambulatory Visit (HOSPITAL_COMMUNITY)
Admission: RE | Admit: 2012-05-27 | Discharge: 2012-05-27 | Disposition: A | Discharge status: Home / Self Care | Payer: Self-pay | Source: Ambulatory Visit | Attending: Internal Medicine | Admitting: Internal Medicine

## 2012-05-27 ENCOUNTER — Encounter (HOSPITAL_COMMUNITY)
Admission: RE | Disposition: A | Discharge status: Home / Self Care | Payer: Self-pay | Source: Ambulatory Visit | Attending: Internal Medicine

## 2012-05-27 ENCOUNTER — Encounter (HOSPITAL_COMMUNITY): Payer: Self-pay | Admitting: *Deleted

## 2012-05-27 DIAGNOSIS — K219 Gastro-esophageal reflux disease without esophagitis: Secondary | ICD-10-CM

## 2012-05-27 DIAGNOSIS — R1310 Dysphagia, unspecified: Secondary | ICD-10-CM

## 2012-05-27 HISTORY — PX: ESOPHAGOGASTRODUODENOSCOPY: SHX1529

## 2012-05-27 SURGERY — ESOPHAGOGASTRODUODENOSCOPY (EGD) WITH ESOPHAGEAL DILATION
Anesthesia: Moderate Sedation

## 2012-05-27 MED ORDER — PROMETHAZINE HCL 25 MG/ML IJ SOLN
25.0000 mg | Freq: Once | INTRAMUSCULAR | Status: AC
  Administered 2012-05-27: 25 mg via INTRAVENOUS

## 2012-05-27 MED ORDER — SODIUM CHLORIDE 0.45 % IV SOLN
INTRAVENOUS | Status: DC
  Administered 2012-05-27: 1000 mL via INTRAVENOUS

## 2012-05-27 MED ORDER — MIDAZOLAM HCL 5 MG/5ML IJ SOLN
INTRAMUSCULAR | Status: DC | PRN
  Administered 2012-05-27: 2 mg via INTRAVENOUS

## 2012-05-27 MED ORDER — STERILE WATER FOR IRRIGATION IR SOLN
Status: DC | PRN
  Administered 2012-05-27: 09:00:00

## 2012-05-27 MED ORDER — MEPERIDINE HCL 100 MG/ML IJ SOLN
INTRAMUSCULAR | Status: DC | PRN
  Administered 2012-05-27: 50 mg via INTRAVENOUS

## 2012-05-27 MED ORDER — MEPERIDINE HCL 100 MG/ML IJ SOLN
INTRAMUSCULAR | Status: AC
  Filled 2012-05-27: qty 1

## 2012-05-27 MED ORDER — SODIUM CHLORIDE 0.9 % IJ SOLN
INTRAMUSCULAR | Status: AC
  Filled 2012-05-27: qty 10

## 2012-05-27 MED ORDER — MIDAZOLAM HCL 5 MG/5ML IJ SOLN
INTRAMUSCULAR | Status: AC
  Filled 2012-05-27: qty 10

## 2012-05-27 MED ORDER — PROMETHAZINE HCL 25 MG/ML IJ SOLN
INTRAMUSCULAR | Status: AC
  Filled 2012-05-27: qty 1

## 2012-05-27 MED ORDER — BUTAMBEN-TETRACAINE-BENZOCAINE 2-2-14 % EX AERO
INHALATION_SPRAY | CUTANEOUS | Status: DC | PRN
  Administered 2012-05-27: 2 via TOPICAL

## 2012-05-27 NOTE — Interval H&P Note (Signed)
History and Physical Interval Note:  05/27/2012 9:26 AM  [name]  has presented today for surgery, with the diagnosis of DYSPHAGIA  The various methods of treatment have been discussed with the patient and family. After consideration of risks, benefits and other options for treatment, the patient has consented to  Procedure(s) (LRB) with comments: ESOPHAGOGASTRODUODENOSCOPY (EGD) WITH ESOPHAGEAL DILATION (N/A) - 9:30/GIVE PHENERGAN 25 MG IV 30 MINS PRIOR TO PROCEDURE as a surgical intervention .  The patient's history has been reviewed, patient examined, no change in status, stable for surgery.  I have reviewed the patient's chart and labs.  Questions were answered to the patient's satisfaction.     [name]

## 2012-05-27 NOTE — H&P (View-Only) (Signed)
Primary Care Physician:  Kizzie Furnish, PA/Dr. Selinda Flavin Primary Gastroenterologist:  Dr. Jena Gauss   Chief Complaint  Patient presents with  . Dysphagia    HPI:   66 year old male referred by Kizzie Furnish, PA, due to dysphagia. Pt reports chronic sore throat. Worsening over past 6 months with intermittent solid food dysphagia. No wt loss, lack of appetite. No N/V. +heartburn. Taking wife's Prilosec for past 2 days. Denies rectal bleeding, abdominal pain.    Last colonoscopy remote past by Dr. Jarold Motto. Requesting records. No lower GI issues currently.   Past Medical History  Diagnosis Date  . Prostate cancer   . Hypertension   . CAD (coronary artery disease)   . Myocardial infarction     X2  . Hypercholesterolemia     Past Surgical History  Procedure Date  . Prostate surgery   . Cardiac surgery   . Colonoscopy     in remote past, Dr. Jarold Motto. Obtaining records.     Current Outpatient Prescriptions  Medication Sig Dispense Refill  . aspirin 81 MG tablet Take 81 mg by mouth daily.      Marland Kitchen lisinopril (PRINIVIL,ZESTRIL) 10 MG tablet Take 10 mg by mouth daily.      . metoprolol succinate (TOPROL-XL) 50 MG 24 hr tablet Take 50 mg by mouth 2 (two) times daily. Take with or immediately following a meal.      . niacin-simvastatin (SIMCOR) 1000-20 MG 24 hr tablet Take 1 tablet by mouth at bedtime.      Marland Kitchen omeprazole (PRILOSEC) 20 MG capsule Take 1 capsule (20 mg total) by mouth daily.  30 capsule  3    Allergies as of 05/01/2012  . (No Known Allergies)    Family History  Problem Relation Age of Onset  . Colon cancer Neg Hx     History   Social History  . Marital Status: Married    Spouse Name: N/A    Number of Children: N/A  . Years of Education: N/A   Occupational History  . Not on file.   Social History Main Topics  . Smoking status: Current Every Day Smoker -- 1.0 packs/day    Types: Cigarettes  . Smokeless tobacco: Not on file  . Alcohol Use: Yes   2-3 beers daily  . Drug Use: No  . Sexually Active: Not on file   Other Topics Concern  . Not on file   Social History Narrative  . No narrative on file    Review of Systems: Gen: Denies any fever, chills, fatigue, weight loss, lack of appetite.  CV: Denies chest pain, heart palpitations, peripheral edema, syncope.  Resp: Denies shortness of breath at rest or with exertion. Denies wheezing or cough.  GI: SEE HPI GU : Denies urinary burning, urinary frequency, urinary hesitancy MS: Denies joint pain, muscle weakness, cramps, or limitation of movement.  Derm: Denies rash, itching, dry skin Psych: Denies depression, anxiety, memory loss, and confusion Heme: Denies bruising, bleeding, and enlarged lymph nodes.  Physical Exam: BP 132/70  Pulse 67  Temp 98.4 F (36.9 C) (Temporal)  Ht 5\' 6"  (1.676 m)  Wt 160 lb 3.2 oz (72.666 kg)  BMI 25.86 kg/m2 General:   Alert and oriented. Pleasant and cooperative. Well-nourished and well-developed.  Head:  Normocephalic and atraumatic. Eyes:  Without icterus, sclera clear and conjunctiva pink.  Ears:  Normal auditory acuity. Nose:  No deformity, discharge,  or lesions. Mouth:  No deformity or lesions, oral mucosa pink.  Neck:  Supple, without  mass or thyromegaly. Lungs:  Clear to auscultation bilaterally. No wheezes, rales, or rhonchi. No distress.  Heart:  S1, S2 present without murmurs appreciated.  Abdomen:  +BS, soft, non-tender and non-distended. No HSM noted. No guarding or rebound. No masses appreciated.  Rectal:  Deferred  Msk:  Symmetrical without gross deformities. Normal posture. Extremities:  Without clubbing or edema. Neurologic:  Alert and  oriented x4;  grossly normal neurologically. Skin:  Intact without significant lesions or rashes. Cervical Nodes:  No significant cervical adenopathy. Psych:  Alert and cooperative. Normal mood and affect.

## 2012-05-27 NOTE — Interval H&P Note (Signed)
History and Physical Interval Note:  05/27/2012 9:55 AM  [name]  has presented today for surgery, with the diagnosis of DYSPHAGIA  The various methods of treatment have been discussed with the patient and family. After consideration of risks, benefits and other options for treatment, the patient has consented to  Procedure(s) (LRB) with comments: ESOPHAGOGASTRODUODENOSCOPY (EGD) WITH ESOPHAGEAL DILATION (N/A) - 9:30/GIVE PHENERGAN 25 MG IV 30 MINS PRIOR TO PROCEDURE as a surgical intervention .  The patient's history has been reviewed, patient examined, no change in status, stable for surgery.  I have reviewed the patient's chart and labs.  Questions were answered to the patient's satisfaction.     [name]

## 2012-05-27 NOTE — Op Note (Signed)
[facility] [address] [place], [address]   ENDOSCOPY PROCEDURE REPORT  PATIENT: Ralph, [name]  MR#: [id number] BIRTHDATE: 07-24-1946 , 66  yrs. old GENDER: Male ENDOSCOPIST: R.  Roetta Sessions, MD [facility] REFERRED BY:  Kizzie Furnish, [place] PROCEDURE DATE:  05/27/2012 PROCEDURE:     EGD with Elease Hashimoto dilation  INDICATIONS:     GERD/esophageal dysphagia-GERD much better recently with the administration Prilosec 20 mg orally daily  INFORMED CONSENT:   The risks, benefits, limitations, alternatives and imponderables have been discussed.  The potential for biopsy, esophogeal dilation, etc. have also been reviewed.  Questions have been answered.  All parties agreeable.  Please see the history and physical in the medical record for more information.  MEDICATIONS:      Versed 2 mg IV and Demerol 50 mg IV. Cetacaine spray.  DESCRIPTION OF PROCEDURE:   The EG-[id number] ([id number])  endoscope was introduced through the mouth and advanced to the second portion of the duodenum without difficulty or limitations.  The mucosal surfaces were surveyed very carefully during advancement of the scope and upon withdrawal.  Retroflexion view of the proximal stomach and esophagogastric junction was performed.      FINDINGS: Normal-appearing, patent tubular esophagus. Stomach empty. Normal gastric mucosa. Patent pylorus. Normal first and second portion of the duodenum  THERAPEUTIC / DIAGNOSTIC MANEUVERS PERFORMED:  A 56 French Maloney dilator was passed to full insertion easily. A look back revealed no apparent complication related to passage of the dilator   COMPLICATIONS:  None  IMPRESSION:  Normal esophagus, stomach and duodenum through the second portion. Status post passage of a Maloney dilator.  RECOMMENDATIONS:  Continue Prilosec 20 mg orally daily. Office visit with [place] in 6 months    _______________________________ R. Roetta Sessions, MD FACP [facility] eSigned:  R.  Roetta Sessions, MD FACP [facility] 05/27/2012 9:45 AM     CC:  PATIENT NAME:  Ralph, [name] MR#: [id number]

## 2012-11-20 ENCOUNTER — Encounter: Payer: Self-pay | Admitting: Internal Medicine

## 2012-11-25 ENCOUNTER — Ambulatory Visit: Payer: Self-pay | Admitting: Urgent Care

## 2012-11-25 ENCOUNTER — Ambulatory Visit: Payer: Self-pay | Admitting: Gastroenterology

## 2012-11-26 ENCOUNTER — Observation Stay (HOSPITAL_COMMUNITY)
Admission: EM | Admit: 2012-11-26 | Discharge: 2012-11-27 | Disposition: A | Discharge status: Home / Self Care | Payer: Medicare Other | Attending: Internal Medicine | Admitting: Internal Medicine

## 2012-11-26 ENCOUNTER — Emergency Department (HOSPITAL_COMMUNITY): Payer: Medicare Other

## 2012-11-26 ENCOUNTER — Encounter (HOSPITAL_COMMUNITY): Payer: Self-pay | Admitting: *Deleted

## 2012-11-26 DIAGNOSIS — I10 Essential (primary) hypertension: Secondary | ICD-10-CM | POA: Insufficient documentation

## 2012-11-26 DIAGNOSIS — E782 Mixed hyperlipidemia: Secondary | ICD-10-CM | POA: Diagnosis present

## 2012-11-26 DIAGNOSIS — E7800 Pure hypercholesterolemia, unspecified: Secondary | ICD-10-CM | POA: Insufficient documentation

## 2012-11-26 DIAGNOSIS — M109 Gout, unspecified: Secondary | ICD-10-CM

## 2012-11-26 DIAGNOSIS — I252 Old myocardial infarction: Secondary | ICD-10-CM | POA: Insufficient documentation

## 2012-11-26 DIAGNOSIS — I498 Other specified cardiac arrhythmias: Secondary | ICD-10-CM

## 2012-11-26 DIAGNOSIS — Z720 Tobacco use: Secondary | ICD-10-CM | POA: Diagnosis present

## 2012-11-26 DIAGNOSIS — E875 Hyperkalemia: Secondary | ICD-10-CM | POA: Diagnosis present

## 2012-11-26 DIAGNOSIS — R0789 Other chest pain: Principal | ICD-10-CM | POA: Insufficient documentation

## 2012-11-26 DIAGNOSIS — I2510 Atherosclerotic heart disease of native coronary artery without angina pectoris: Secondary | ICD-10-CM | POA: Diagnosis present

## 2012-11-26 DIAGNOSIS — E871 Hypo-osmolality and hyponatremia: Secondary | ICD-10-CM

## 2012-11-26 DIAGNOSIS — R079 Chest pain, unspecified: Secondary | ICD-10-CM | POA: Diagnosis present

## 2012-11-26 DIAGNOSIS — E785 Hyperlipidemia, unspecified: Secondary | ICD-10-CM

## 2012-11-26 DIAGNOSIS — F1010 Alcohol abuse, uncomplicated: Secondary | ICD-10-CM | POA: Diagnosis present

## 2012-11-26 DIAGNOSIS — K219 Gastro-esophageal reflux disease without esophagitis: Secondary | ICD-10-CM | POA: Diagnosis present

## 2012-11-26 DIAGNOSIS — R1310 Dysphagia, unspecified: Secondary | ICD-10-CM

## 2012-11-26 DIAGNOSIS — Z23 Encounter for immunization: Secondary | ICD-10-CM | POA: Insufficient documentation

## 2012-11-26 DIAGNOSIS — F17200 Nicotine dependence, unspecified, uncomplicated: Secondary | ICD-10-CM

## 2012-11-26 LAB — COMPREHENSIVE METABOLIC PANEL
ALT: 18 U/L (ref 0–53)
AST: 32 U/L (ref 0–37)
Albumin: 4.1 g/dL (ref 3.5–5.2)
Alkaline Phosphatase: 93 U/L (ref 39–117)
BUN: 10 mg/dL (ref 6–23)
CO2: 26 mEq/L (ref 19–32)
Calcium: 9.6 mg/dL (ref 8.4–10.5)
Chloride: 94 mEq/L — ABNORMAL LOW (ref 96–112)
Creatinine, Ser: 0.93 mg/dL (ref 0.50–1.35)
GFR calc Af Amer: >90 mL/min (ref >90)
GFR calc non Af Amer: 86 mL/min — ABNORMAL LOW (ref >90)
Glucose, Bld: 97 mg/dL (ref 70–99)
Potassium: 5.2 mEq/L — ABNORMAL HIGH (ref 3.5–5.1)
Sodium: 130 mEq/L — ABNORMAL LOW (ref 135–145)
Total Bilirubin: 0.6 mg/dL (ref 0.3–1.2)
Total Protein: 7.9 g/dL (ref 6.0–8.3)

## 2012-11-26 LAB — CBC
HCT: 40.3 % (ref 39.0–52.0)
Hemoglobin: 14.5 g/dL (ref 13.0–17.0)
MCH: 32.2 pg (ref 26.0–34.0)
MCHC: 36 g/dL (ref 30.0–36.0)
MCV: 89.6 fL (ref 78.0–100.0)
Platelets: 202 10*3/uL (ref 150–400)
RBC: 4.5 MIL/uL (ref 4.22–5.81)
RDW: 13 % (ref 11.5–15.5)
WBC: 6 10*3/uL (ref 4.0–10.5)

## 2012-11-26 LAB — URINALYSIS, ROUTINE W REFLEX MICROSCOPIC
Bilirubin Urine: NEGATIVE
Glucose, UA: NEGATIVE mg/dL
Hgb urine dipstick: NEGATIVE
Ketones, ur: NEGATIVE mg/dL
Leukocytes, UA: NEGATIVE
Nitrite: NEGATIVE
Protein, ur: NEGATIVE mg/dL
Specific Gravity, Urine: 1.015 (ref 1.005–1.030)
Urobilinogen, UA: 0.2 mg/dL (ref 0.0–1.0)
pH: 6 (ref 5.0–8.0)

## 2012-11-26 LAB — TROPONIN I
Troponin I: <0.3 ng/mL (ref <0.30)
Troponin I: <0.3 ng/mL (ref <0.30)

## 2012-11-26 MED ORDER — TRAZODONE HCL 50 MG PO TABS: 50.0000 mg | ORAL_TABLET | Freq: Every evening | ORAL | Status: DC | PRN

## 2012-11-26 MED ORDER — NITROGLYCERIN 0.4 MG SL SUBL: 0.4000 mg | SUBLINGUAL_TABLET | SUBLINGUAL | Status: DC | PRN

## 2012-11-26 MED ORDER — OMEGA-3-ACID ETHYL ESTERS 1 G PO CAPS
1.0000 g | ORAL_CAPSULE | Freq: Two times a day (BID) | ORAL | Status: DC
  Administered 2012-11-26 – 2012-11-27 (×2): 1 g via ORAL
  Filled 2012-11-26 (×2): qty 1

## 2012-11-26 MED ORDER — METOPROLOL SUCCINATE ER 50 MG PO TB24
50.0000 mg | ORAL_TABLET | Freq: Every day | ORAL | Status: DC
  Administered 2012-11-27: 50 mg via ORAL
  Filled 2012-11-26: qty 1

## 2012-11-26 MED ORDER — ENOXAPARIN SODIUM 40 MG/0.4ML ~~LOC~~ SOLN
40.0000 mg | SUBCUTANEOUS | Status: DC
  Administered 2012-11-26: 40 mg via SUBCUTANEOUS
  Filled 2012-11-26: qty 0.4

## 2012-11-26 MED ORDER — NITROGLYCERIN 2 % TD OINT
1.0000 [in_us] | TOPICAL_OINTMENT | Freq: Once | TRANSDERMAL | Status: AC
  Administered 2012-11-26: 1 [in_us] via TOPICAL
  Filled 2012-11-26: qty 1

## 2012-11-26 MED ORDER — DM-GUAIFENESIN ER 30-600 MG PO TB12: 1.0000 | ORAL_TABLET | Freq: Two times a day (BID) | ORAL | Status: DC | PRN

## 2012-11-26 MED ORDER — SODIUM CHLORIDE 0.9 % IJ SOLN
3.0000 mL | Freq: Two times a day (BID) | INTRAMUSCULAR | Status: DC
  Administered 2012-11-26: 3 mL via INTRAVENOUS

## 2012-11-26 MED ORDER — ALBUTEROL SULFATE (5 MG/ML) 0.5% IN NEBU: 2.5000 mg | INHALATION_SOLUTION | RESPIRATORY_TRACT | Status: DC | PRN

## 2012-11-26 MED ORDER — LORAZEPAM 1 MG PO TABS: 1.0000 mg | ORAL_TABLET | ORAL | Status: DC | PRN

## 2012-11-26 MED ORDER — ACETAMINOPHEN 650 MG RE SUPP: 650.0000 mg | Freq: Four times a day (QID) | RECTAL | Status: DC | PRN

## 2012-11-26 MED ORDER — NIACIN-SIMVASTATIN ER 1000-20 MG PO TB24: 1.0000 | ORAL_TABLET | Freq: Every day | ORAL | Status: DC

## 2012-11-26 MED ORDER — ASPIRIN 81 MG PO CHEW
81.0000 mg | CHEWABLE_TABLET | Freq: Every day | ORAL | Status: DC
Start: 2012-11-27 — End: 2012-11-27
  Administered 2012-11-26 – 2012-11-27 (×2): 81 mg via ORAL
  Filled 2012-11-26 (×2): qty 1

## 2012-11-26 MED ORDER — CLONIDINE HCL 0.1 MG PO TABS: 0.1000 mg | ORAL_TABLET | ORAL | Status: DC | PRN

## 2012-11-26 MED ORDER — VITAMIN B-1 100 MG PO TABS
100.0000 mg | ORAL_TABLET | Freq: Every day | ORAL | Status: DC
  Administered 2012-11-26 – 2012-11-27 (×2): 100 mg via ORAL
  Filled 2012-11-26 (×2): qty 1

## 2012-11-26 MED ORDER — NIACIN ER 500 MG PO CPCR
1000.0000 mg | ORAL_CAPSULE | Freq: Every day | ORAL | Status: DC
  Filled 2012-11-26: qty 2

## 2012-11-26 MED ORDER — ALUM & MAG HYDROXIDE-SIMETH 200-200-20 MG/5ML PO SUSP: 30.0000 mL | Freq: Four times a day (QID) | ORAL | Status: DC | PRN

## 2012-11-26 MED ORDER — SIMVASTATIN 20 MG PO TABS
20.0000 mg | ORAL_TABLET | Freq: Every day | ORAL | Status: DC
  Administered 2012-11-26: 20 mg via ORAL
  Filled 2012-11-26: qty 1

## 2012-11-26 MED ORDER — ASPIRIN 81 MG PO CHEW
324.0000 mg | CHEWABLE_TABLET | Freq: Once | ORAL | Status: AC
  Administered 2012-11-26: 324 mg via ORAL
  Filled 2012-11-26: qty 4

## 2012-11-26 MED ORDER — MORPHINE SULFATE 2 MG/ML IJ SOLN: 2.0000 mg | INTRAMUSCULAR | Status: DC | PRN

## 2012-11-26 MED ORDER — NICOTINE 21 MG/24HR TD PT24
21.0000 mg | MEDICATED_PATCH | Freq: Every day | TRANSDERMAL | Status: DC
  Administered 2012-11-26 – 2012-11-27 (×2): 21 mg via TRANSDERMAL
  Filled 2012-11-26 (×2): qty 1

## 2012-11-26 MED ORDER — PANTOPRAZOLE SODIUM 40 MG PO TBEC
40.0000 mg | DELAYED_RELEASE_TABLET | Freq: Every day | ORAL | Status: DC
  Administered 2012-11-27: 40 mg via ORAL
  Filled 2012-11-26: qty 1

## 2012-11-26 MED ORDER — ACETAMINOPHEN 325 MG PO TABS: 650.0000 mg | ORAL_TABLET | Freq: Four times a day (QID) | ORAL | Status: DC | PRN

## 2012-11-26 MED ORDER — NITROGLYCERIN 0.4 MG SL SUBL
0.4000 mg | SUBLINGUAL_TABLET | Freq: Once | SUBLINGUAL | Status: AC
  Administered 2012-11-26: 0.4 mg via SUBLINGUAL
  Filled 2012-11-26: qty 25

## 2012-11-26 MED ORDER — PNEUMOCOCCAL VAC POLYVALENT 25 MCG/0.5ML IJ INJ
0.5000 mL | INJECTION | INTRAMUSCULAR | Status: AC
  Administered 2012-11-27: 0.5 mL via INTRAMUSCULAR
  Filled 2012-11-26: qty 0.5

## 2012-11-26 NOTE — H&P (Signed)
Hospital Admission Note Date: 11/26/2012  Patient name: [name] Medical record number: [id number] Date of birth: 01/24/1946 Age: 67 y.o. Gender: male PCP: Rudi Heap, MD  Attending physician: Christiane Ha, MD  Chief Complaint: chest pain  History of Present Illness:  [name] is an 67 y.o. male with a history of heart disease, CABG, and gastroesophageal reflux disease who presents with substernal chest pressure that started at rest. He had no relief after sublingual nitroglycerin, but did notice relief after nitro paste was placed. He had no accompanying symptoms. The pain was 8/10. It did not radiate. He has not seen his cardiologist, Dr. Daleen Squibb for several years. Last cardiac catheterization was in 2011 which showed patent grafts and normal ejection fraction. He has no pain currently. It was not positional. He is compliant with medications, but continues to smoke a pack of cigarettes a day drink at least 12 beers daily. He has a history of esophageal dysphagia and had an EGD by Dr. Jena Gauss recently, but I am unable to find the results. He takes Prilosec daily. Upon reviewing his medications, he tells me he takes indomethacin twice daily every day "for gout". He misunderstood the prescription and thought he was to take it routinely, rather than when necessary. He has had no vomiting, no bloody stools. Usually, he is quite active, and has no exertional chest pain. He has had a slight cough for the past few days, but no fevers chills shortness of breath or other URI symptoms.  Past Medical History  Diagnosis Date  . Prostate cancer   . Hypertension   . CAD (coronary artery disease)   . Myocardial infarction     X2  . Hypercholesterolemia     Meds: Prescriptions prior to admission  Medication Sig Dispense Refill  . aspirin 81 MG tablet Take 81 mg by mouth daily.      . indomethacin (INDOCIN) 25 MG capsule Take 50 mg by mouth 3 (three) times daily.      . metoprolol succinate  (TOPROL-XL) 50 MG 24 hr tablet Take 50 mg by mouth daily. Take with or immediately following a meal.      . niacin-simvastatin (SIMCOR) 1000-20 MG 24 hr tablet Take 1 tablet by mouth at bedtime.      Marland Kitchen omega-3 acid ethyl esters (LOVAZA) 1 G capsule Take 1 g by mouth 2 (two) times daily.      Marland Kitchen omeprazole (PRILOSEC) 20 MG capsule Take 1 capsule (20 mg total) by mouth daily.  30 capsule  3    Allergies: Review of patient's allergies indicates no known allergies. History   Social History  . Marital Status: Married    Spouse Name: N/A    Number of Children: N/A  . Years of Education: N/A   Occupational History  . Not on file.   Social History Main Topics  . Smoking status: Current Every Day Smoker -- 1.00 packs/day for 50 years    Types: Cigarettes  . Smokeless tobacco: Not on file  . Alcohol Use: Yes     Comment: 2-3 beers daily  . Drug Use: No  . Sexually Active: Not on file   Other Topics Concern  . Not on file   Social History Narrative  . No narrative on file   Family History  Problem Relation Age of Onset  . Colon cancer Neg Hx   . Diabetes Mother    Past Surgical History  Procedure Laterality Date  . Prostate surgery    .  Cardiac surgery    . Colonoscopy      in remote past, Dr. Jarold Motto. Obtaining records.   . Back surgery    . Esophagogastroduodenoscopy  05/27/2012    ZOX:[id number] esophagus, stomach and duodenum s/p dilator    Review of Systems: Systems reviewed and as per HPI, otherwise negative.  Physical Exam: Blood pressure 187/95, pulse 57, temperature 97.6 F (36.4 C), temperature source Oral, resp. rate 19, height 5\' 6"  (1.676 m), weight 72.122 kg (159 lb), SpO2 97.00%. BP 144/85  Pulse 64  Temp(Src) 97.2 F (36.2 C) (Oral)  Resp 20  Ht 5\' 6"  (1.676 m)  Wt 69.3 kg (152 lb 12.5 oz)  BMI 24.67 kg/m2  SpO2 93%  General Appearance:    Alert, cooperative, no distress, appears stated age  Head:    Normocephalic, without obvious abnormality,  atraumatic  Eyes:    PERRL, conjunctiva/corneas clear, EOM's intact, fundi    benign, both eyes          Nose:   Nares normal, septum midline, mucosa normal, no drainage    or sinus tenderness  Throat:   Lips, mucosa, and tongue normal; teeth and gums normal  Neck:   Supple, symmetrical, trachea midline, no adenopathy;       thyroid:  No enlargement/tenderness/nodules; no carotid   bruit or JVD  Back:     Symmetric, no curvature, ROM normal, no CVA tenderness  Lungs:     Clear to auscultation bilaterally, respirations unlabored  Chest wall:    No tenderness or deformity  Heart:    Regular rate and rhythm, S1 and S2 normal, no murmur, rub   or gallop  Abdomen:     Soft, non-tender, bowel sounds active all four quadrants,    no masses, no organomegaly  Genitalia:    Normal male without lesion, discharge or tenderness  Rectal:    Normal tone, normal prostate, no masses or tenderness;   guaiac negative stool  Extremities:   Extremities normal, atraumatic, no cyanosis or edema  Pulses:   2+ and symmetric all extremities  Skin:   Skin color, texture, turgor normal, no rashes or lesions  Lymph nodes:   Cervical, supraclavicular, and axillary nodes normal  Neurologic:   CNII-XII intact. Normal strength, sensation and reflexes      throughout    Psychiatric: Calm, cooperative, pleasant  Lab results: Basic Metabolic Panel:  Recent Labs  40/98/11 1324  NA 130*  K 5.2*  CL 94*  CO2 26  GLUCOSE 97  BUN 10  CREATININE 0.93  CALCIUM 9.6   Liver Function Tests:  Recent Labs  11/26/12 1324  AST 32  ALT 18  ALKPHOS 93  BILITOT 0.6  PROT 7.9  ALBUMIN 4.1   No results found for this basename: LIPASE, AMYLASE,  in the last 72 hours No results found for this basename: AMMONIA,  in the last 72 hours CBC:  Recent Labs  11/26/12 1324  WBC 6.0  HGB 14.5  HCT 40.3  MCV 89.6  PLT 202   Cardiac Enzymes:  Recent Labs  11/26/12 1324  TROPONINI <0.30  Urinalysis:  Recent  Labs  11/26/12 1422  COLORURINE YELLOW  LABSPEC 1.015  PHURINE 6.0  GLUCOSEU NEGATIVE  HGBUR NEGATIVE  BILIRUBINUR NEGATIVE  KETONESUR NEGATIVE  PROTEINUR NEGATIVE  UROBILINOGEN 0.2  NITRITE NEGATIVE  LEUKOCYTESUR NEGATIVE   Imaging results:  Dg Chest Portable 1 View  11/26/2012  *RADIOLOGY REPORT*  Clinical Data: Chest pain  PORTABLE CHEST - 1  VIEW  Comparison: November 23, 2009.  Findings: Cardiomediastinal silhouette is within normal limits. Sternotomy wires are noted.  Surgical clips are noted along the left mediastinal and cardiac border.  No acute pulmonary disease is noted.  Bony thorax is intact.  IMPRESSION: No acute cardiopulmonary abnormality seen.   Original Report Authenticated By: Lupita Raider.,  M.D.     Assessment & Plan: Principal Problem:   Chest pain: Patient will be admitted overnight to telemetry. Rule out MI. If no one MI, and no further chest pain, may followup with cardiology as an outpatient. We talked about the importance of quitting smoking. Also moderate alcohol intake. Could also be gastrointestinal etiology, as patient has been taking twice daily indomethacin due to misunderstanding, in the setting of gastroesophageal reflux disease. Talked about stopping indomethacin at home. Active Problems:   HYPERLIPIDEMIA   ETHANOL ABUSE: Denies history of withdrawal. Will give thiamine, monitor for probable signs, and Ativan as needed and get social work to evaluate.   TOBACCO USER: Needs to quit   Essential hypertension, malignant: Resume outpatient medications, give clonidine as needed and monitor   GERD (gastroesophageal reflux disease) continue proton pump at the   Gout, stable   CAD (coronary artery disease) hyperkalemia: Not on any medications that could cause this. Will repeat tomorrow. Hyponatremia: Monitor.  Derian Dimalanta L 11/26/2012, 6:51 PM

## 2012-11-26 NOTE — ED Notes (Signed)
Reports pain in chest 5/10 prior to nitro administration

## 2012-11-26 NOTE — ED Notes (Signed)
Pt states pain is almost gone, 1/10, BP 165/79

## 2012-11-26 NOTE — ED Notes (Signed)
Pt with mid chest pain since this morning around 0900, denies N/V or SOB, did have mild dizziness earlier

## 2012-11-26 NOTE — ED Notes (Signed)
Pt states pain is 1 or 2/10

## 2012-11-26 NOTE — ED Notes (Signed)
2nd nitro given for unchanged pain, bp 163/87

## 2012-11-27 DIAGNOSIS — E785 Hyperlipidemia, unspecified: Secondary | ICD-10-CM

## 2012-11-27 DIAGNOSIS — I2510 Atherosclerotic heart disease of native coronary artery without angina pectoris: Secondary | ICD-10-CM

## 2012-11-27 LAB — BASIC METABOLIC PANEL
BUN: 10 mg/dL (ref 6–23)
CO2: 27 mEq/L (ref 19–32)
Calcium: 9.7 mg/dL (ref 8.4–10.5)
Chloride: 101 mEq/L (ref 96–112)
Creatinine, Ser: 0.82 mg/dL (ref 0.50–1.35)
GFR calc Af Amer: >90 mL/min (ref >90)
GFR calc non Af Amer: >90 mL/min (ref >90)
Glucose, Bld: 104 mg/dL — ABNORMAL HIGH (ref 70–99)
Potassium: 4.2 mEq/L (ref 3.5–5.1)
Sodium: 136 mEq/L (ref 135–145)

## 2012-11-27 LAB — TROPONIN I: Troponin I: <0.3 ng/mL (ref <0.30)

## 2012-11-27 LAB — TSH: TSH: 0.433 u[IU]/mL (ref 0.350–4.500)

## 2012-11-27 MED ORDER — NICOTINE 21 MG/24HR TD PT24: 1.0000 | MEDICATED_PATCH | Freq: Every day | TRANSDERMAL | Status: DC

## 2012-11-27 MED ORDER — NITROGLYCERIN 0.4 MG SL SUBL: 0.4000 mg | SUBLINGUAL_TABLET | SUBLINGUAL | Status: DC | PRN

## 2012-11-27 MED ORDER — INDOMETHACIN 25 MG PO CAPS: 50.0000 mg | ORAL_CAPSULE | Freq: Two times a day (BID) | ORAL | Status: DC | PRN

## 2012-11-27 NOTE — Progress Notes (Signed)
Pt. D/c instructions provided and discussed, IV removed, tele removed, prescriptions provided, and smoking cessation handouts provided. Pt. Encouraged to follow up with providers mentioned in AVS

## 2012-11-27 NOTE — Discharge Summary (Signed)
Physician Discharge Summary  [name] MRN: [id number] DOB/AGE: 11/16/1945 67 y.o.  Admit date: 11/26/2012 Discharge date: 11/27/2012  Discharge Diagnoses:  Principal Problem:   Chest pain Active Problems:   HYPERLIPIDEMIA   ETHANOL ABUSE   TOBACCO USER   Essential hypertension, malignant   GERD (gastroesophageal reflux disease)   Gout   CAD (coronary artery disease)   Hyperkalemia   Hyponatremia     Medication List    TAKE these medications       aspirin 81 MG tablet  Take 81 mg by mouth daily.     indomethacin 25 MG capsule  Commonly known as:  INDOCIN  Take 2 capsules (50 mg total) by mouth 2 (two) times daily as needed.     metoprolol succinate 50 MG 24 hr tablet  Commonly known as:  TOPROL-XL  Take 50 mg by mouth daily. Take with or immediately following a meal.     niacin-simvastatin 1000-20 MG 24 hr tablet  Commonly known as:  SIMCOR  Take 1 tablet by mouth at bedtime.     nicotine 21 mg/24hr patch  Commonly known as:  NICODERM CQ - dosed in mg/24 hours  Place 1 patch onto the skin daily.     nitroGLYCERIN 0.4 MG SL tablet  Commonly known as:  NITROSTAT  Place 1 tablet (0.4 mg total) under the tongue every 5 (five) minutes as needed for chest pain.     omega-3 acid ethyl esters 1 G capsule  Commonly known as:  LOVAZA  Take 1 g by mouth 2 (two) times daily.     omeprazole 20 MG capsule  Commonly known as:  PRILOSEC  Take 1 capsule (20 mg total) by mouth daily.            Discharge Orders   Future Orders Complete By Expires     Activity as tolerated - No restrictions  As directed     Diet - low sodium heart healthy  As directed     Discharge instructions  As directed     Comments:      Quit smoking.  Decrease alcohol intake.  Only take indomethacin AS NEEDED for gout       Follow-up Information   Follow up with Valera Castle, MD.   Contact information:   [address] [place] [address] [phone]        Disposition: 01-Home or Self Care  Discharged Condition: stable  Consults:  none  Labs:   Results for orders placed during the hospital encounter of 11/26/12 (from the past 48 hour(s))  CBC     Status: None   Collection Time    11/26/12  1:24 PM      Result Value Range   WBC 6.0  4.0 - 10.5 K/uL   RBC 4.50  4.22 - 5.81 MIL/uL   Hemoglobin 14.5  13.0 - 17.0 g/dL   HCT 21.3  08.6 - 57.8 %   MCV 89.6  78.0 - 100.0 fL   MCH 32.2  26.0 - 34.0 pg   MCHC 36.0  30.0 - 36.0 g/dL   RDW 46.9  62.9 - 52.8 %   Platelets 202  150 - 400 K/uL  COMPREHENSIVE METABOLIC PANEL     Status: Abnormal   Collection Time    11/26/12  1:24 PM      Result Value Range   Sodium 130 (*) 135 - 145 mEq/L   Potassium 5.2 (*) 3.5 -  5.1 mEq/L   Chloride 94 (*) 96 - 112 mEq/L   CO2 26  19 - 32 mEq/L   Glucose, Bld 97  70 - 99 mg/dL   BUN 10  6 - 23 mg/dL   Creatinine, Ser 1.47  0.50 - 1.35 mg/dL   Calcium 9.6  8.4 - 82.9 mg/dL   Total Protein 7.9  6.0 - 8.3 g/dL   Albumin 4.1  3.5 - 5.2 g/dL   AST 32  0 - 37 U/L   ALT 18  0 - 53 U/L   Alkaline Phosphatase 93  39 - 117 U/L   Total Bilirubin 0.6  0.3 - 1.2 mg/dL   GFR calc non Af Amer 86 (*) >90 mL/min   GFR calc Af Amer >90  >90 mL/min   Comment:            The eGFR has been calculated     using the CKD EPI equation.     This calculation has not been     validated in all clinical     situations.     eGFR's persistently     <90 mL/min signify     possible Chronic Kidney Disease.  TROPONIN I     Status: None   Collection Time    11/26/12  1:24 PM      Result Value Range   Troponin I <0.30  <0.30 ng/mL   Comment:            Due to the release kinetics of cTnI,     a negative result within the first hours     of the onset of symptoms does not rule out     myocardial infarction with certainty.     If myocardial infarction is still suspected,     repeat the test at appropriate intervals.  URINALYSIS, ROUTINE W REFLEX MICROSCOPIC     Status:  None   Collection Time    11/26/12  2:22 PM      Result Value Range   Color, Urine YELLOW  YELLOW   APPearance CLEAR  CLEAR   Specific Gravity, Urine 1.015  1.005 - 1.030   pH 6.0  5.0 - 8.0   Glucose, UA NEGATIVE  NEGATIVE mg/dL   Hgb urine dipstick NEGATIVE  NEGATIVE   Bilirubin Urine NEGATIVE  NEGATIVE   Ketones, ur NEGATIVE  NEGATIVE mg/dL   Protein, ur NEGATIVE  NEGATIVE mg/dL   Urobilinogen, UA 0.2  0.0 - 1.0 mg/dL   Nitrite NEGATIVE  NEGATIVE   Leukocytes, UA NEGATIVE  NEGATIVE   Comment: MICROSCOPIC NOT DONE ON URINES WITH NEGATIVE PROTEIN, BLOOD, LEUKOCYTES, NITRITE, OR GLUCOSE <1000 mg/dL.  TROPONIN I     Status: None   Collection Time    11/26/12  7:12 PM      Result Value Range   Troponin I <0.30  <0.30 ng/mL   Comment:            Due to the release kinetics of cTnI,     a negative result within the first hours     of the onset of symptoms does not rule out     myocardial infarction with certainty.     If myocardial infarction is still suspected,     repeat the test at appropriate intervals.  TROPONIN I     Status: None   Collection Time    11/27/12  1:24 AM      Result Value Range   Troponin I <  0.30  <0.30 ng/mL   Comment:            Due to the release kinetics of cTnI,     a negative result within the first hours     of the onset of symptoms does not rule out     myocardial infarction with certainty.     If myocardial infarction is still suspected,     repeat the test at appropriate intervals.  BASIC METABOLIC PANEL     Status: Abnormal   Collection Time    11/27/12  5:32 AM      Result Value Range   Sodium 136  135 - 145 mEq/L   Potassium 4.2  3.5 - 5.1 mEq/L   Comment: DELTA CHECK NOTED   Chloride 101  96 - 112 mEq/L   CO2 27  19 - 32 mEq/L   Glucose, Bld 104 (*) 70 - 99 mg/dL   BUN 10  6 - 23 mg/dL   Creatinine, Ser 1.61  0.50 - 1.35 mg/dL   Calcium 9.7  8.4 - 09.6 mg/dL   GFR calc non Af Amer >90  >90 mL/min   GFR calc Af Amer >90  >90 mL/min    Comment:            The eGFR has been calculated     using the CKD EPI equation.     This calculation has not been     validated in all clinical     situations.     eGFR's persistently     <90 mL/min signify     possible Chronic Kidney Disease.    Diagnostics:  Dg Chest Portable 1 View  11/26/2012  *RADIOLOGY REPORT*  Clinical Data: Chest pain  PORTABLE CHEST - 1 VIEW  Comparison: November 23, 2009.  Findings: Cardiomediastinal silhouette is within normal limits. Sternotomy wires are noted.  Surgical clips are noted along the left mediastinal and cardiac border.  No acute pulmonary disease is noted.  Bony thorax is intact.  IMPRESSION: No acute cardiopulmonary abnormality seen.   Original Report Authenticated By: Lupita Raider.,  M.D.    EKG: Normal sinus rhythm Nonspecific ST abnormality  Full Code   Hospital Course: See H&P for complete admission details. The patient is a 67 year old white male with history of heart disease, lost to followup who presented with chest pain. He had no relief with nitroglycerin. He reported taking indomethacin twice daily for several months due to a misunderstanding regarding instructions on its use. He has a history of gastroesophageal reflux disease. EKG, vitals and physical examination were unremarkable. Patient was placed on observation, telemetry. He ruled out for MI. He was instructed to take his indomethacin only as needed for gout attack. He will followup with cardiology as an outpatient to consider any further testing if needed.  Discharge Exam:  Blood pressure 144/85, pulse 64, temperature 97.2 F (36.2 C), temperature source Oral, resp. rate 20, height 5\' 6"  (1.676 m), weight 69.3 kg (152 lb 12.5 oz), SpO2 93.00%.  Unchanged from 11/26/12  Signed: Crista Curb L 11/27/2012, 9:50 AM

## 2012-11-27 NOTE — Clinical Social Work Psychosocial (Signed)
Clinical Social Work Department BRIEF PSYCHOSOCIAL ASSESSMENT 11/27/2012  Patient:  [name], [name]     Account Number:  [id number]     Admit date:  11/26/2012  Clinical Social Worker:  Nancie Neas  Date/Time:  11/27/2012 09:25 AM  Referred by:  Physician  Date Referred:  11/27/2012 Referred for  Substance Abuse   Other Referral:   Interview type:  Patient Other interview type:   and wife    PSYCHOSOCIAL DATA Living Status:  WIFE Admitted from facility:   Level of care:   Primary support name:  Darel Hong Primary support relationship to patient:  SPOUSE Degree of support available:   supportive    CURRENT CONCERNS Current Concerns  Substance Abuse   Other Concerns:    SOCIAL WORK ASSESSMENT / PLAN CSW met with pt at bedside. Pt alert and oriented and agreed to his wife being present during assessment. Pt indicates he came to ED with chest pain and has a significant cardiac history with 2 heart attacks and open heart surgery about 15 years ago. Pt lives with his wife and his daughter is there some as well. He is retired.    Pt admits to daily drinking about 3-4 beers with more on weekends. He states he has been drinking this amount since he was 67 years old. Pt admits to multiple DUIs and lost his license about 30 years ago. Pt does not feel his drinking is a problem and thinks he can stop on his own fairly easily. He has been to AA which was court ordered but did not find it helpful. CSW completed SBIRT and pt scored a 12, indicating risky drinking. Pt appears to be more concerned about stopping smoking at this time. His wife also did not appear to be overly concerned about his drinking, but said she would like him to cut back. CSW offered outpatient follow up which pt declined but he did accept Rethinking Drinking booklet.   Assessment/plan status:  Referral to [facility] Other assessment/ plan:   Information/referral to community resources:   Rethinking Drinking  Pt  refused other referrals    PATIENT'S/FAMILY'S RESPONSE TO PLAN OF CARE: Pt laughed multiple times during assessment and did not appear concerned about discussion. He stated he can stop on his own if he chooses to. CSW signing off but can be reconsulted if needed.       Derenda Fennel, [place] [phone]

## 2012-12-09 ENCOUNTER — Ambulatory Visit (INDEPENDENT_AMBULATORY_CARE_PROVIDER_SITE_OTHER): Payer: Medicare Other | Admitting: Physician Assistant

## 2012-12-09 ENCOUNTER — Encounter: Payer: Self-pay | Admitting: Physician Assistant

## 2012-12-09 VITALS — BP 158/88 | HR 62 | Ht 66.0 in | Wt 152.1 lb

## 2012-12-09 DIAGNOSIS — I10 Essential (primary) hypertension: Secondary | ICD-10-CM

## 2012-12-09 DIAGNOSIS — R079 Chest pain, unspecified: Secondary | ICD-10-CM

## 2012-12-09 DIAGNOSIS — I2510 Atherosclerotic heart disease of native coronary artery without angina pectoris: Secondary | ICD-10-CM

## 2012-12-09 DIAGNOSIS — E785 Hyperlipidemia, unspecified: Secondary | ICD-10-CM

## 2012-12-09 MED ORDER — AMLODIPINE BESYLATE 5 MG PO TABS: 5.0000 mg | ORAL_TABLET | Freq: Every day | ORAL | Status: DC

## 2012-12-09 NOTE — Patient Instructions (Addendum)
START NORVASC 5 MG DAILY; RX SENT IN TODAY  Your physician has requested that you have en exercise stress myoview. PLEASE SCHEDULE AT [place] [facility] [phone]. For further information please visit [email or web address] Please follow instruction sheet, as given.   PLEASE FOLLOW UP WITH ONE OF THE CARDIOLOGIST IN THE [place] OFFICE TO BE SEEN IN 3 MONTHS; PREVIOUS DR. WALL PT  WORK ON QUITTING SMOKING PER SCOTT WEAVER, PAC

## 2012-12-09 NOTE — Progress Notes (Signed)
[address], [address] [place], [place]  [address] Phone: [phone] Fax:  [phone]  Date:  12/09/2012   ID:  COLDEN SAMARAS, DOB 1946/02/17, MRN [id number]  PCP:  Rudi Heap, MD  Primary Cardiologist:  Dr. Valera Castle     History of Present Illness: [name] is a 67 y.o. male who returns for f/u after a recent admission to the hospital.  He has a hx of CAD, s/p CABG in 2001, HTN, HL, tobacco abuse. LHC 11/24/09: 3 vessel CAD and 4/4 grafts patent.  Last seen by Dr. Daleen Squibb 12/2009. Patient was recently admitted to [facility] 11/26/12 with chest pain.  Unfortunately, no discharge summary is available. Review of the chart demonstrates his CXR was unremarkable, ECG was without acute change and CEs remained normal.  He states he developed CP after starting to take indomethacin daily.  Denies further CP.  No exertional CP.  No DOE.  No syncope.  No orthopnea, PND, edema.    Labs (4/14):  K 4.2, Cr 0.82, ALT 18, Hgb 14.5, TSH 0.433  Wt Readings from Last 3 Encounters:  12/09/12 152 lb 1.9 oz (69.001 kg)  11/27/12 152 lb 12.5 oz (69.3 kg)  05/01/12 160 lb 3.2 oz (72.666 kg)     Past Medical History  Diagnosis Date  . Prostate cancer   . Hypertension   . CAD (coronary artery disease)     a. s/p INF MI 1997;  b. s/p CABG 2001;  c. LHC 11/2009:  3v CAD, S-PDA ok with 40% mid, S-OM ok, S-Dx ok, L-LAD ok, EF 50%  . Hypercholesterolemia   . Tobacco abuse   . ETOH abuse   . GERD (gastroesophageal reflux disease)     Current Outpatient Prescriptions  Medication Sig Dispense Refill  . aspirin 81 MG tablet Take 81 mg by mouth daily.      . indomethacin (INDOCIN) 25 MG capsule Take 2 capsules (50 mg total) by mouth 2 (two) times daily as needed.      . metoprolol succinate (TOPROL-XL) 50 MG 24 hr tablet Take 50 mg by mouth daily. Take with or immediately following a meal.      . niacin-simvastatin (SIMCOR) 1000-20 MG 24 hr tablet Take 1 tablet by mouth at bedtime.       . nitroGLYCERIN (NITROSTAT) 0.4 MG SL tablet Place 1 tablet (0.4 mg total) under the tongue every 5 (five) minutes as needed for chest pain.  10 tablet  0  . omega-3 acid ethyl esters (LOVAZA) 1 G capsule Take 1 g by mouth 2 (two) times daily.      Marland Kitchen omeprazole (PRILOSEC) 20 MG capsule Take 1 capsule (20 mg total) by mouth daily.  30 capsule  3   No current facility-administered medications for this visit.    Allergies:   No Known Allergies  Social History:  The patient  reports that he has been smoking Cigarettes.  He has a 50 pack-year smoking history. He does not have any smokeless tobacco history on file. He reports that  drinks alcohol. He reports that he does not use illicit drugs.   ROS:  Please see the history of present illness.   He has occasional hemorrhoidal bleeding.  All other systems reviewed and negative.   PHYSICAL EXAM: VS:  BP 158/88  Pulse 62  Ht 5\' 6"  (1.676 m)  Wt 152 lb 1.9 oz (69.001 kg)  BMI 24.56 kg/m2 Well nourished, well developed, in no acute distress  HEENT: normal Neck: no JVD Vascular:  No carotid bruits Cardiac:  distant S1, S2; RRR; no murmur Lungs:  clear to auscultation bilaterally, no wheezing, rhonchi or rales Abd: soft, nontender, no hepatomegaly Ext: no edema Skin: warm and dry Neuro:  CNs 2-12 intact, no focal abnormalities noted  EKG:  NSR, HR 62, inf Q waves, TWI V5-6     ASSESSMENT AND PLAN:  1. Chest Pain:  Atypical CP.  He continues to smoke.  ECG demonstrates subtle changes when compared to last tracing.  It has been 3 years since his last LHC.  Arrange ETT-Myoview for risk stratification.   2. CAD:  Proceed with stress testing as noted.  Continue ASA and statin. 3. Hypertension:  Uncontrolled.  Add Amlodipine 5 mg QD.  He is on simvastatin 20 mg QD, therefore it is acceptable to [place] amlodipine. 4. Hyperlipidemia:  Managed by PCP.  5. Tobacco Abuse:  We discussed the importance of cessation and different strategies for quitting.      6. Disposition:  F/u with one of our doctors in [place] (closer to his home) in 3 mos.   Signed, Tereso Newcomer, PA-C  1:03 PM 12/09/2012

## 2012-12-12 NOTE — ED Provider Notes (Signed)
History     CSN: [id number]  Arrival date & time 11/26/12  1310   First MD Initiated Contact with Patient 11/26/12 1329      Chief Complaint  Patient presents with  . Chest Pain     HPI Pt with mid chest pain since this morning around 0900, denies N/V or SOB, did have mild dizziness earlier  Past Medical History  Diagnosis Date  . Prostate cancer   . Hypertension   . CAD (coronary artery disease)     a. s/p INF MI 1997;  b. s/p CABG 2001;  c. LHC 11/2009:  3v CAD, S-PDA ok with 40% mid, S-OM ok, S-Dx ok, L-LAD ok, EF 50%  . Hypercholesterolemia   . Tobacco abuse   . ETOH abuse   . GERD (gastroesophageal reflux disease)     Past Surgical History  Procedure Laterality Date  . Prostate surgery    . Cardiac surgery    . Colonoscopy      in remote past, Dr. Jarold Motto. Obtaining records.   . Back surgery    . Esophagogastroduodenoscopy  05/27/2012    [id number] esophagus, stomach and duodenum s/p dilator    Family History  Problem Relation Age of Onset  . Colon cancer Neg Hx   . Diabetes Mother     History  Substance Use Topics  . Smoking status: Current Every Day Smoker -- 1.00 packs/day for 50 years    Types: Cigarettes  . Smokeless tobacco: Not on file  . Alcohol Use: Yes     Comment: 2-3 beers daily      Review of Systems All other systems reviewed and are negative Allergies  Review of patient's allergies indicates no known allergies.  Home Medications   Current Outpatient Rx  Name  Route  Sig  Dispense  Refill  . aspirin 81 MG tablet   Oral   Take 81 mg by mouth daily.         . metoprolol succinate (TOPROL-XL) 50 MG 24 hr tablet   Oral   Take 50 mg by mouth daily. Take with or immediately following a meal.         . niacin-simvastatin (SIMCOR) 1000-20 MG 24 hr tablet   Oral   Take 1 tablet by mouth at bedtime.         Marland Kitchen omega-3 acid ethyl esters (LOVAZA) 1 G capsule   Oral   Take 1 g by mouth 2 (two) times daily.         Marland Kitchen  omeprazole (PRILOSEC) 20 MG capsule   Oral   Take 1 capsule (20 mg total) by mouth daily.   30 capsule   3   . amLODipine (NORVASC) 5 MG tablet   Oral   Take 1 tablet (5 mg total) by mouth daily.   30 tablet   11   . indomethacin (INDOCIN) 25 MG capsule   Oral   Take 2 capsules (50 mg total) by mouth 2 (two) times daily as needed.         . nitroGLYCERIN (NITROSTAT) 0.4 MG SL tablet   Sublingual   Place 1 tablet (0.4 mg total) under the tongue every 5 (five) minutes as needed for chest pain.   10 tablet   0     BP 144/85  Pulse 64  Temp(Src) 97.2 F (36.2 C) (Oral)  Resp 20  Ht 5\' 6"  (1.676 m)  Wt 152 lb 12.5 oz (69.3 kg)  BMI 24.67 kg/m2  SpO2  93%  Physical Exam  Nursing note and vitals reviewed. Constitutional: He is oriented to person, place, and time. He appears well-developed and well-nourished. No distress.  HENT:  Head: Normocephalic and atraumatic.  Eyes: Pupils are equal, round, and reactive to light.  Neck: Normal range of motion.  Cardiovascular: Normal rate and intact distal pulses.   Pulmonary/Chest: No respiratory distress.  Abdominal: Normal appearance. He exhibits no distension. There is no tenderness.  Musculoskeletal: Normal range of motion.  Neurological: He is alert and oriented to person, place, and time. No cranial nerve deficit.  Skin: Skin is warm and dry. No rash noted.  Psychiatric: He has a normal mood and affect. His behavior is normal.    ED Course  Procedures (including critical care time)  Date: 12/12/2012  Rate: 61  Rhythm: normal sinus rhythm  QRS Axis: normal  Intervals: normal  ST/T Wave abnormalities: Nonspecific ST changes  Conduction Disutrbances: none  Narrative Interpretation: Nonspecific EKG     Labs Reviewed  COMPREHENSIVE METABOLIC PANEL - Abnormal; Notable for the following:    Sodium 130 (*)    Potassium 5.2 (*)    Chloride 94 (*)    GFR calc non Af Amer 86 (*)    All other components within normal  limits  BASIC METABOLIC PANEL - Abnormal; Notable for the following:    Glucose, Bld 104 (*)    All other components within normal limits  CBC  TROPONIN I  URINALYSIS, ROUTINE W REFLEX MICROSCOPIC  TROPONIN I  TROPONIN I  TSH   No results found.   1. Chest pain   2. TOBACCO USER   3. HYPERLIPIDEMIA   4. Alcohol abuse, unspecified   5. Essential hypertension, malignant   6. GERD (gastroesophageal reflux disease)   7. Hyperkalemia   8. Hyponatremia   9. ETHANOL ABUSE   10. Gout   11. CAD (coronary artery disease)   12. HYPERTENSION   13. BRADYCARDIA   14. Dysphagia       MDM         Nelia Shi, MD 12/12/12 [phone]

## 2012-12-16 ENCOUNTER — Other Ambulatory Visit: Payer: Self-pay | Admitting: Physician Assistant

## 2012-12-16 DIAGNOSIS — R079 Chest pain, unspecified: Secondary | ICD-10-CM

## 2012-12-18 ENCOUNTER — Encounter (HOSPITAL_COMMUNITY)
Admission: RE | Admit: 2012-12-18 | Discharge: 2012-12-18 | Disposition: A | Discharge status: Home / Self Care | Payer: Medicare Other | Source: Ambulatory Visit | Attending: Physician Assistant | Admitting: Physician Assistant

## 2012-12-18 ENCOUNTER — Encounter (HOSPITAL_COMMUNITY): Payer: Self-pay

## 2012-12-18 ENCOUNTER — Ambulatory Visit (HOSPITAL_COMMUNITY)
Admission: RE | Admit: 2012-12-18 | Discharge: 2012-12-18 | Disposition: A | Discharge status: Home / Self Care | Payer: Medicare Other | Source: Ambulatory Visit | Attending: Physician Assistant | Admitting: Physician Assistant

## 2012-12-18 DIAGNOSIS — I2510 Atherosclerotic heart disease of native coronary artery without angina pectoris: Secondary | ICD-10-CM | POA: Insufficient documentation

## 2012-12-18 DIAGNOSIS — R079 Chest pain, unspecified: Secondary | ICD-10-CM | POA: Insufficient documentation

## 2012-12-18 DIAGNOSIS — Z951 Presence of aortocoronary bypass graft: Secondary | ICD-10-CM | POA: Insufficient documentation

## 2012-12-18 MED ORDER — TECHNETIUM TC 99M SESTAMIBI - CARDIOLITE
10.0000 | Freq: Once | INTRAVENOUS | Status: AC | PRN
  Administered 2012-12-18: 09:00:00 10 via INTRAVENOUS

## 2012-12-18 MED ORDER — SODIUM CHLORIDE 0.9 % IJ SOLN
INTRAMUSCULAR | Status: AC
  Administered 2012-12-18: 10 mL via INTRAVENOUS
  Filled 2012-12-18: qty 10

## 2012-12-18 MED ORDER — TECHNETIUM TC 99M SESTAMIBI GENERIC - CARDIOLITE
30.0000 | Freq: Once | INTRAVENOUS | Status: AC | PRN
  Administered 2012-12-18: 30 via INTRAVENOUS

## 2012-12-18 MED ORDER — REGADENOSON 0.4 MG/5ML IV SOLN
INTRAVENOUS | Status: AC
  Administered 2012-12-18: 0.4 mg via INTRAVENOUS
  Filled 2012-12-18: qty 5

## 2012-12-18 NOTE — Progress Notes (Signed)
Stress Lab Nurses Notes - [name]  [name] 12/18/2012 Reason for doing test: CAD and Chest Pain Type of test: Test Changed unable to reach THR, Lexiscan cardiolite given Nurse performing test: Parke Poisson, RN Nuclear Medicine Tech: Lou Cal Echo Tech: Not Applicable MD performing test: R. Rothbart & Joni Reining NP Family MD: Christell Constant Test explained and consent signed: yes IV started: 22g jelco, Saline lock flushed, No redness or edema and Saline lock started in radiology Symptoms: Hip pain  Treatment/Intervention: None Reason test stopped: protocol completed After recovery IV was: Discontinued via X-ray tech and No redness or edema Patient to return to Nuc. Med at : 11:30 Patient discharged: Home Patient's Condition upon discharge was: stable Comments: During test BP 171/82 & HR 104.  Recovery BP 153/85 & HR 66.  Symptoms resolved in recovery. [name] T

## 2012-12-23 ENCOUNTER — Encounter: Payer: Self-pay | Admitting: Physician Assistant

## 2013-03-21 ENCOUNTER — Ambulatory Visit: Payer: Medicare Other | Admitting: Cardiology

## 2013-04-28 ENCOUNTER — Ambulatory Visit (INDEPENDENT_AMBULATORY_CARE_PROVIDER_SITE_OTHER): Payer: Medicare Other | Admitting: Cardiology

## 2013-04-28 VITALS — BP 141/65 | HR 50 | Ht 66.0 in | Wt 149.5 lb

## 2013-04-28 DIAGNOSIS — I10 Essential (primary) hypertension: Secondary | ICD-10-CM

## 2013-04-28 DIAGNOSIS — E782 Mixed hyperlipidemia: Secondary | ICD-10-CM

## 2013-04-28 DIAGNOSIS — I25810 Atherosclerosis of coronary artery bypass graft(s) without angina pectoris: Secondary | ICD-10-CM

## 2013-04-28 MED ORDER — ATORVASTATIN CALCIUM 80 MG PO TABS: 80.0000 mg | ORAL_TABLET | Freq: Every day | ORAL | Status: DC

## 2013-04-28 MED ORDER — AMLODIPINE BESYLATE 5 MG PO TABS: 5.0000 mg | ORAL_TABLET | Freq: Every day | ORAL | Status: DC

## 2013-04-28 MED ORDER — LISINOPRIL 5 MG PO TABS: 5.0000 mg | ORAL_TABLET | Freq: Every day | ORAL | Status: DC

## 2013-04-28 MED ORDER — METOPROLOL TARTRATE 25 MG PO TABS: 25.0000 mg | ORAL_TABLET | Freq: Two times a day (BID) | ORAL | Status: DC

## 2013-04-28 NOTE — Patient Instructions (Addendum)
Your physician recommends that you schedule a follow-up appointment in:ONE MONTH  Your physician has requested that you have an echocardiogram. Echocardiography is a painless test that uses sound waves to create images of your heart. It provides your doctor with information about the size and shape of your heart and how well your heart's chambers and valves are working. This procedure takes approximately one hour. There are no restrictions for this procedure.  WE WILL CALL YOU WITH YOUR TEST RESULTS/INSTRUCTIONS/NEXT STEPS ONCE RECEIVED BY THE PROVIDER   Your physician has recommended you make the following change in your medication:   1) STOP TAKING SIMCOR 2) START TAKING ATORVASTATIN 80MG  ONCE DAILY 3) START TAKING LISINIOPRIL 5MG  ONCE DAILY  Your physician recommends that you return for lab work in: ONE WEEK PRIOR TO YOUR FOLLOW UP OFFICE VISIT (The patient's paper medical record is not available during this visit. It has been removed from this office and cannot be located.(CMP,FLP)

## 2013-04-28 NOTE — Progress Notes (Signed)
Clinical Summary [name] is a 67 y.o.male  1. CAD - prior MI in 1997, prior CABG - cath 2011 showed patent grafts - myoview 12/2012 shows old inferolateral scar, no ischemia  - no recent chest pain. Occas SOB, describes some DOE 300-400 yards which is stable. No orthopnea, no PND, no LE edema - compliant w/ meds: ASA, metoprolol, simcor.   2. HTN - does not check at home - compliant w/ meds   3. HL - no recent panel - compliant w/ statin and niacin  4. Tobacco - still smoking 1 ppd, reports he is willing to try to quit   Past Medical History  Diagnosis Date  . Prostate cancer   . Hypertension   . CAD (coronary artery disease)     a. s/p INF MI 1997;  b. s/p CABG 2001;  c. LHC 11/2009:  3v CAD, S-PDA ok with 40% mid, S-OM ok, S-Dx ok, L-LAD ok, EF 50%;  d.  Lex MV 5/14:  Inferolateral scar, EF 46%, no ischemia  . Hypercholesterolemia   . Tobacco abuse   . ETOH abuse   . GERD (gastroesophageal reflux disease)      No Known Allergies   Current Outpatient Prescriptions  Medication Sig Dispense Refill  . amLODipine (NORVASC) 5 MG tablet Take 1 tablet (5 mg total) by mouth daily.  30 tablet  11  . aspirin 81 MG tablet Take 81 mg by mouth daily.      . indomethacin (INDOCIN) 25 MG capsule Take 50 mg by mouth 3 (three) times daily as needed.      . metoprolol tartrate (LOPRESSOR) 25 MG tablet Take 25 mg by mouth 2 (two) times daily.       . niacin-simvastatin (SIMCOR) 750-20 MG 24 hr tablet Take 1 tablet by mouth at bedtime.      . nitroGLYCERIN (NITROSTAT) 0.4 MG SL tablet Place 1 tablet (0.4 mg total) under the tongue every 5 (five) minutes as needed for chest pain.  10 tablet  0  . omega-3 acid ethyl esters (LOVAZA) 1 G capsule Take 1 g by mouth 2 (two) times daily.      Marland Kitchen omeprazole (PRILOSEC) 20 MG capsule Take 1 capsule (20 mg total) by mouth daily.  30 capsule  3   No current facility-administered medications for this visit.     Past Surgical History    Procedure Laterality Date  . Prostate surgery    . Cardiac surgery    . Colonoscopy      in remote past, Dr. Jarold Motto. Obtaining records.   . Back surgery    . Esophagogastroduodenoscopy  05/27/2012    [id number] esophagus, stomach and duodenum s/p dilator     No Known Allergies    Family History  Problem Relation Age of Onset  . Colon cancer Neg Hx   . Diabetes Mother      Social History [name] reports that he has been smoking Cigarettes.  He has a 50 pack-year smoking history. He does not have any smokeless tobacco history on file. [name] reports that  drinks alcohol.   Review of Systems 12 point ROS negative other than reported in HPI  Physical Examination Filed Vitals:   04/28/13 1306  BP: 141/65  Pulse: 50   Filed Weights   04/28/13 1306  Weight: 149 lb 8 oz (67.813 kg)  Gen: resting comfortably, NAD HEENT: no scleral icterus, pupils equal round and reactive, no palptable cervical adenopathy CV: RRR, no m/r/g,  no JVD, no carotid bruit Pulm: CTAB Abd: soft, NT, ND NABS, no hepatosplenomegaly Ext: warm, no edema.  Skin: warm, no rash Neuro: A&Ox3, no focal deficits    Diagnostic Studies 11/25/2009:  Cardiac Cath Findings: Left ventricular angiogram was performed in the RAO projection and  showed normal left ventricular systolic function with ejection  fraction estimated at 50%. Mild mitral regurgitation was noted.  10.Aortic root angiogram was performed and did not show enlargement of  the aortic root.  IMPRESSION:  1. Severe triple-vessel coronary artery disease.  2. Patent bypass grafts 4/4.  3. Low normal left ventricular systolic function.    12/2012 Myoview  Inferolateral scar, no active ischemia, LVEF 46%.     Assessment and Plan  1.CAD - recent negative myoview when he was having chest pain a few months back, pain has now resolved. - continue current medical therapy. Will change simva/niacin to atorva 80mg  daily consistent w/  new lipid guidelines in patients w/ known CAD. - obtain 2D echo given his hx of CAD and symptoms of DOE, his LVEF on recent nuclear was 46% (low normal for nuclear study), but is less accurate than echo.  2. HTN - start low dose ACE-I in setting of CAD for secondary CV risk benefits and bp control - educated on low Na diet, DASH diet, and weight loss  3. HL - change to high dose statin. Stopping niacin for now, no strong data to support clinical outcome benefits. - f/u repeat lipid panel, f/u LFTs  4. Tobacco -counseled on benefits of NRT to help stop smoking, pt reports he will try patches w/ prn gum, will reeval progress at next visit.    Antoine Poche, M.D., F.A.C.C.

## 2013-05-01 ENCOUNTER — Ambulatory Visit (HOSPITAL_COMMUNITY)
Admission: RE | Admit: 2013-05-01 | Discharge: 2013-05-01 | Disposition: A | Discharge status: Home / Self Care | Payer: Medicare Other | Source: Ambulatory Visit | Attending: Cardiology | Admitting: Cardiology

## 2013-05-01 DIAGNOSIS — I2510 Atherosclerotic heart disease of native coronary artery without angina pectoris: Secondary | ICD-10-CM | POA: Insufficient documentation

## 2013-05-01 DIAGNOSIS — R0609 Other forms of dyspnea: Secondary | ICD-10-CM | POA: Insufficient documentation

## 2013-05-01 DIAGNOSIS — I25810 Atherosclerosis of coronary artery bypass graft(s) without angina pectoris: Secondary | ICD-10-CM

## 2013-05-01 DIAGNOSIS — F1010 Alcohol abuse, uncomplicated: Secondary | ICD-10-CM | POA: Insufficient documentation

## 2013-05-01 DIAGNOSIS — E785 Hyperlipidemia, unspecified: Secondary | ICD-10-CM | POA: Insufficient documentation

## 2013-05-01 DIAGNOSIS — I10 Essential (primary) hypertension: Secondary | ICD-10-CM | POA: Insufficient documentation

## 2013-05-01 DIAGNOSIS — I319 Disease of pericardium, unspecified: Secondary | ICD-10-CM

## 2013-05-01 DIAGNOSIS — R0989 Other specified symptoms and signs involving the circulatory and respiratory systems: Secondary | ICD-10-CM | POA: Insufficient documentation

## 2013-05-01 DIAGNOSIS — F17200 Nicotine dependence, unspecified, uncomplicated: Secondary | ICD-10-CM | POA: Insufficient documentation

## 2013-05-01 NOTE — Progress Notes (Signed)
*  PRELIMINARY RESULTS* Echocardiogram 2D Echocardiogram has been performed.  [name] 05/01/2013, 4:22 PM

## 2013-05-23 ENCOUNTER — Other Ambulatory Visit: Payer: Self-pay | Admitting: *Deleted

## 2013-05-23 ENCOUNTER — Encounter: Payer: Self-pay | Admitting: *Deleted

## 2013-05-23 DIAGNOSIS — I10 Essential (primary) hypertension: Secondary | ICD-10-CM

## 2013-05-23 DIAGNOSIS — E782 Mixed hyperlipidemia: Secondary | ICD-10-CM

## 2013-05-23 DIAGNOSIS — I25810 Atherosclerosis of coronary artery bypass graft(s) without angina pectoris: Secondary | ICD-10-CM

## 2013-05-29 ENCOUNTER — Other Ambulatory Visit (INDEPENDENT_AMBULATORY_CARE_PROVIDER_SITE_OTHER): Payer: Medicare Other

## 2013-05-29 DIAGNOSIS — I25810 Atherosclerosis of coronary artery bypass graft(s) without angina pectoris: Secondary | ICD-10-CM

## 2013-05-29 DIAGNOSIS — E782 Mixed hyperlipidemia: Secondary | ICD-10-CM

## 2013-05-29 NOTE — Progress Notes (Signed)
Pt came in for labs from dr. Wyline Mood dx 414.04, 272.2

## 2013-05-30 LAB — CMP14+EGFR
ALT: 16 IU/L (ref 0–44)
AST: 26 IU/L (ref 0–40)
Albumin/Globulin Ratio: 1.4 (ref 1.1–2.5)
Albumin: 4.1 g/dL (ref 3.6–4.8)
Alkaline Phosphatase: 118 IU/L — ABNORMAL HIGH (ref 39–117)
BUN/Creatinine Ratio: 9 — ABNORMAL LOW (ref 10–22)
BUN: 8 mg/dL (ref 8–27)
CO2: 23 mmol/L (ref 18–29)
Calcium: 10 mg/dL (ref 8.6–10.2)
Chloride: 101 mmol/L (ref 97–108)
Creatinine, Ser: 0.9 mg/dL (ref 0.76–1.27)
GFR calc Af Amer: 103 mL/min/{1.73_m2} (ref >59)
GFR calc non Af Amer: 89 mL/min/{1.73_m2} (ref >59)
Globulin, Total: 2.9 g/dL (ref 1.5–4.5)
Glucose: 92 mg/dL (ref 65–99)
Potassium: 5.4 mmol/L — ABNORMAL HIGH (ref 3.5–5.2)
Sodium: 141 mmol/L (ref 134–144)
Total Bilirubin: 0.7 mg/dL (ref 0.0–1.2)
Total Protein: 7 g/dL (ref 6.0–8.5)

## 2013-05-30 LAB — LIPID PANEL
Chol/HDL Ratio: 2.3 ratio units (ref 0.0–5.0)
Cholesterol, Total: 136 mg/dL (ref 100–199)
HDL: 60 mg/dL (ref >39)
LDL Calculated: 63 mg/dL (ref 0–99)
Triglycerides: 66 mg/dL (ref 0–149)
VLDL Cholesterol Cal: 13 mg/dL (ref 5–40)

## 2013-06-05 ENCOUNTER — Encounter: Payer: Self-pay | Admitting: Cardiology

## 2013-06-05 ENCOUNTER — Ambulatory Visit (INDEPENDENT_AMBULATORY_CARE_PROVIDER_SITE_OTHER): Payer: Medicare Other | Admitting: Cardiology

## 2013-06-05 VITALS — BP 128/77 | HR 65 | Ht 64.0 in | Wt 148.0 lb

## 2013-06-05 DIAGNOSIS — I10 Essential (primary) hypertension: Secondary | ICD-10-CM

## 2013-06-05 DIAGNOSIS — E785 Hyperlipidemia, unspecified: Secondary | ICD-10-CM

## 2013-06-05 DIAGNOSIS — I25810 Atherosclerosis of coronary artery bypass graft(s) without angina pectoris: Secondary | ICD-10-CM

## 2013-06-05 MED ORDER — METOPROLOL TARTRATE 25 MG PO TABS: 25.0000 mg | ORAL_TABLET | Freq: Two times a day (BID) | ORAL | Status: DC

## 2013-06-05 NOTE — Progress Notes (Signed)
Clinical Summary [name] is a 67 y.o.male comes in today for follow up of the following problems.   1. CAD  - prior MI in 1997, prior CABG approx 13 years ago at [facility] - cath 2011 showed patent grafts. 04/2013 echo shows LVEF 60% - myoview 12/2012 shows old inferolateral scar, no ischemia  - no recent chest pain. Occas SOB, describes some DOE 300-400 yards which is stable. No orthopnea, no PND, no LE edema  - compliant w/ meds: ASA, metoprolol, atorvastatin  2. HTN  - does not check at home  - compliant w/ meds  - last visit started lisinipril 5mg , follow up BMET showed K of 5.4. Called patient to stop lisinopril, however misunderstood and stopped hismetoprolol  3. HL  - last visit changed to atorvastatin, denies any significant side effects  4. Tobacco  - notes some improvement since starting nicotine gum on number of cigarettes per day, however states gum makes his teeth hurt so not using as much. He did not try the patches.     Past Medical History  Diagnosis Date  . Prostate cancer   . Hypertension   . CAD (coronary artery disease)     a. s/p INF MI 1997;  b. s/p CABG 2001;  c. LHC 11/2009:  3v CAD, S-PDA ok with 40% mid, S-OM ok, S-Dx ok, L-LAD ok, EF 50%;  d.  Lex MV 5/14:  Inferolateral scar, EF 46%, no ischemia  . Hypercholesterolemia   . Tobacco abuse   . ETOH abuse   . GERD (gastroesophageal reflux disease)      No Known Allergies   Current Outpatient Prescriptions  Medication Sig Dispense Refill  . amLODipine (NORVASC) 5 MG tablet Take 1 tablet (5 mg total) by mouth daily.  30 tablet  11  . aspirin 81 MG tablet Take 81 mg by mouth daily.      Marland Kitchen atorvastatin (LIPITOR) 80 MG tablet Take 1 tablet (80 mg total) by mouth daily.  90 tablet  3  . indomethacin (INDOCIN) 25 MG capsule Take 50 mg by mouth 3 (three) times daily as needed.      Marland Kitchen lisinopril (PRINIVIL,ZESTRIL) 5 MG tablet Take 1 tablet (5 mg total) by mouth daily.  90 tablet  3  . metoprolol  tartrate (LOPRESSOR) 25 MG tablet Take 1 tablet (25 mg total) by mouth 2 (two) times daily.  60 tablet  11  . nitroGLYCERIN (NITROSTAT) 0.4 MG SL tablet Place 1 tablet (0.4 mg total) under the tongue every 5 (five) minutes as needed for chest pain.  10 tablet  0  . omega-3 acid ethyl esters (LOVAZA) 1 G capsule Take 1 g by mouth 2 (two) times daily.      Marland Kitchen omeprazole (PRILOSEC) 20 MG capsule Take 1 capsule (20 mg total) by mouth daily.  30 capsule  3   No current facility-administered medications for this visit.     Past Surgical History  Procedure Laterality Date  . Prostate surgery    . Cardiac surgery    . Colonoscopy      in remote past, Dr. Jarold Motto. Obtaining records.   . Back surgery    . Esophagogastroduodenoscopy  05/27/2012    [id number] esophagus, stomach and duodenum s/p dilator     No Known Allergies    Family History  Problem Relation Age of Onset  . Colon cancer Neg Hx   . Diabetes Mother      Social History [name]  reports that he has been smoking Cigarettes.  He has a 50 pack-year smoking history. He does not have any smokeless tobacco history on file. [name] reports that he drinks alcohol.   Review of Systems CONSTITUTIONAL: No weight loss, fever, chills, weakness or fatigue.  HEENT: Eyes: No visual loss, blurred vision, double vision or yellow sclerae.No hearing loss, sneezing, congestion, runny nose or sore throat.  SKIN: No rash or itching.  CARDIOVASCULAR: per HPI RESPIRATORY: per HPI  GASTROINTESTINAL: No anorexia, nausea, vomiting or diarrhea. No abdominal pain or blood.  GENITOURINARY: No burning on urination, no polyuria NEUROLOGICAL: No headache, dizziness, syncope, paralysis, ataxia, numbness or tingling in the extremities. No change in bowel or bladder control.  MUSCULOSKELETAL: No muscle, back pain, joint pain or stiffness.  LYMPHATICS: No enlarged nodes. No history of splenectomy.  PSYCHIATRIC: No history of depression or anxiety.    ENDOCRINOLOGIC: No reports of sweating, cold or heat intolerance. No polyuria or polydipsia.  Marland Kitchen   Physical Examination p 65 bp 128/77 Wt 148 lbs BMI 25 Gen: resting comfortably, no acute distress HEENT: no scleral icterus, pupils equal round and reactive, no palptable cervical adenopathy,  CV: RRR, no m/r/g, no JVD, no carotid bruits Resp: Clear to auscultation bilaterally GI: abdomen is soft, non-tender, non-distended, normal bowel sounds, no hepatosplenomegaly MSK: extremities are warm, no edema.  Skin: warm, no rash Neuro:  no focal deficits Psych: appropriate affect   Diagnostic Studies 11/25/2009:  Cardiac Cath Findings: Left ventricular angiogram was performed in the RAO projection and  showed normal left ventricular systolic function with ejection  fraction estimated at 50%. Mild mitral regurgitation was noted.  10.Aortic root angiogram was performed and did not show enlargement of  the aortic root.  IMPRESSION:  1. Severe triple-vessel coronary artery disease.  2. Patent bypass grafts 4/4.  3. Low normal left ventricular systolic function.   12/2012 Myoview  Inferolateral scar, no active ischemia, LVEF 46%.    05/01/13 Echo: LVEF 55-60%, grade II diastolic dysfunction, multiple WMAs, mild MR,   Pertinent labs 05/29/13: K 5.4 Na 141 AST 26 ALT 16 Cr 0.90 TC 136 TG 66 HDL 60 LDL 63   Assessment and Plan  1.CAD  - recent negative myoview when he was having chest pain a few months back, pain has now resolved.  - continue current medical therapy.  - recent echo shows LVEF 55-60%  2. HTN  - started on low dose ACE-I last visit, repeat chem panel showed increased potassium - patient was contacted and asked to stop the lisinopril, however he mistakenly stopped the metoprolol instead - went through medication bag and labeled lisinopril to stop - he will have repeat labs next week   3. HL  - continue high dose statin in setting of known cardiovascular disease.     4. Tobacco  -counseled on benefits of NRT to help stop smoking - some benefit with gum but caused his teeth to hurt. Recommended either patches or e-cigarettes       Antoine Poche, M.D., F.A.C.C.

## 2013-06-05 NOTE — Patient Instructions (Signed)
Your physician recommends that you schedule a follow-up appointment in: 6 months You will receive a reminder letter two months in advance reminding you to call and schedule your appointment. If you don't receive this letter, please contact our office.  Your physician has recommended you make the following change in your medication:  1. Stop Lisinopril

## 2013-06-10 ENCOUNTER — Other Ambulatory Visit (INDEPENDENT_AMBULATORY_CARE_PROVIDER_SITE_OTHER): Payer: Medicare Other

## 2013-06-10 DIAGNOSIS — R7989 Other specified abnormal findings of blood chemistry: Secondary | ICD-10-CM

## 2013-06-11 ENCOUNTER — Telehealth: Payer: Self-pay | Admitting: *Deleted

## 2013-06-11 LAB — CMP14+EGFR
ALT: 15 IU/L (ref 0–44)
AST: 24 IU/L (ref 0–40)
Albumin/Globulin Ratio: 1.6 (ref 1.1–2.5)
Albumin: 4.6 g/dL (ref 3.6–4.8)
Alkaline Phosphatase: 123 IU/L — ABNORMAL HIGH (ref 39–117)
BUN/Creatinine Ratio: 12 (ref 10–22)
BUN: 8 mg/dL (ref 8–27)
CO2: 26 mmol/L (ref 18–29)
Calcium: 9.6 mg/dL (ref 8.6–10.2)
Chloride: 94 mmol/L — ABNORMAL LOW (ref 97–108)
Creatinine, Ser: 0.66 mg/dL — ABNORMAL LOW (ref 0.76–1.27)
GFR calc Af Amer: 117 mL/min/{1.73_m2} (ref >59)
GFR calc non Af Amer: 101 mL/min/{1.73_m2} (ref >59)
Globulin, Total: 2.9 g/dL (ref 1.5–4.5)
Glucose: 63 mg/dL — ABNORMAL LOW (ref 65–99)
Potassium: 4.9 mmol/L (ref 3.5–5.2)
Sodium: 136 mmol/L (ref 134–144)
Total Bilirubin: 0.3 mg/dL (ref 0.0–1.2)
Total Protein: 7.5 g/dL (ref 6.0–8.5)

## 2013-06-11 NOTE — Telephone Encounter (Signed)
Pt needs to know if he should still go and get labs done 06/12/13. States that [facility] sent him for labs yesterday.

## 2013-06-11 NOTE — Telephone Encounter (Signed)
.  left message to have patient return my call.  

## 2013-06-12 NOTE — Telephone Encounter (Signed)
This nurse advised the pt the labs to be drawn was called into him by his PCP office per PCP was routed the labs we drew and they wanted additional labs, pt understood to contact PCP to confirm the need to have further labs per this office does not have the pt set up for any labs to br drawn from a cardiac standpoint, the pt understood

## 2013-07-08 ENCOUNTER — Other Ambulatory Visit: Payer: Self-pay | Admitting: Cardiology

## 2013-07-08 LAB — COMPREHENSIVE METABOLIC PANEL
ALT: 26 U/L (ref 0–53)
AST: 34 U/L (ref 0–37)
Albumin: 3.9 g/dL (ref 3.5–5.2)
Alkaline Phosphatase: 101 U/L (ref 39–117)
BUN: 12 mg/dL (ref 6–23)
CO2: 30 mEq/L (ref 19–32)
Calcium: 9.3 mg/dL (ref 8.4–10.5)
Chloride: 104 mEq/L (ref 96–112)
Creat: 0.87 mg/dL (ref 0.50–1.35)
Glucose, Bld: 91 mg/dL (ref 70–99)
Potassium: 4.3 mEq/L (ref 3.5–5.3)
Sodium: 140 mEq/L (ref 135–145)
Total Bilirubin: 0.4 mg/dL (ref 0.3–1.2)
Total Protein: 7.3 g/dL (ref 6.0–8.3)

## 2013-07-08 LAB — LIPID PANEL
Cholesterol: 133 mg/dL (ref 0–200)
HDL: 46 mg/dL (ref >39)
LDL Cholesterol: 72 mg/dL (ref 0–99)
Total CHOL/HDL Ratio: 2.9 Ratio
Triglycerides: 74 mg/dL (ref <150)
VLDL: 15 mg/dL (ref 0–40)

## 2013-07-15 ENCOUNTER — Encounter: Payer: Self-pay | Admitting: *Deleted

## 2014-02-04 ENCOUNTER — Ambulatory Visit (INDEPENDENT_AMBULATORY_CARE_PROVIDER_SITE_OTHER): Payer: Medicare Other | Admitting: Cardiology

## 2014-02-04 VITALS — BP 136/68 | HR 50 | Ht 66.0 in | Wt 151.0 lb

## 2014-02-04 DIAGNOSIS — Z720 Tobacco use: Secondary | ICD-10-CM

## 2014-02-04 DIAGNOSIS — I10 Essential (primary) hypertension: Secondary | ICD-10-CM

## 2014-02-04 DIAGNOSIS — I2510 Atherosclerotic heart disease of native coronary artery without angina pectoris: Secondary | ICD-10-CM

## 2014-02-04 DIAGNOSIS — R001 Bradycardia, unspecified: Secondary | ICD-10-CM

## 2014-02-04 DIAGNOSIS — I498 Other specified cardiac arrhythmias: Secondary | ICD-10-CM

## 2014-02-04 DIAGNOSIS — F17200 Nicotine dependence, unspecified, uncomplicated: Secondary | ICD-10-CM

## 2014-02-04 MED ORDER — OMEGA-3-ACID ETHYL ESTERS 1 G PO CAPS: 1.0000 g | ORAL_CAPSULE | Freq: Two times a day (BID) | ORAL | Status: DC

## 2014-02-04 MED ORDER — AMLODIPINE BESYLATE 5 MG PO TABS: 5.0000 mg | ORAL_TABLET | Freq: Every day | ORAL | Status: DC

## 2014-02-04 MED ORDER — ATORVASTATIN CALCIUM 80 MG PO TABS: 80.0000 mg | ORAL_TABLET | Freq: Every day | ORAL | Status: DC

## 2014-02-04 MED ORDER — LISINOPRIL 5 MG PO TABS: 5.0000 mg | ORAL_TABLET | Freq: Every day | ORAL | Status: DC

## 2014-02-04 MED ORDER — METOPROLOL TARTRATE 25 MG PO TABS: 12.5000 mg | ORAL_TABLET | Freq: Two times a day (BID) | ORAL | Status: DC

## 2014-02-04 NOTE — Progress Notes (Signed)
Clinical Summary [name] is a 68 y.o.male seen today for follow up of the following medical problems.   1. CAD  - prior MI in 1997, prior CABG approx 13 years ago at [facility]  - cath 2011 showed patent grafts. 04/2013 echo shows LVEF 60%  - myoview 12/2012 shows old inferolateral scar, no ischemia   - no recent chest pain. Occas SOB, describes some DOE 300-400 yards which is stable. No orthopnea, no PND, no LE edema  - compliant w/ meds   2. HTN  - does not check at home  - compliant w/ meds  - last visit started lisinipril 5mg , follow up BMET showed K of 5.4. We contacted the patient in regards to stopping the lisinopril, however he never did. K has trended down.   3. HL  - last visit changed to atorvastatin, denies any significant side effects  - 07/2013 panel: TC 133 TG 74 HDL 46 LDL 72  4. Tobacco  - using e-cig - still smoking  Past Medical History  Diagnosis Date  . Prostate cancer   . Hypertension   . CAD (coronary artery disease)     a. s/p INF MI 1997;  b. s/p CABG 2001;  c. [place] 11/2009:  3v CAD, S-PDA ok with 40% mid, S-OM ok, S-Dx ok, L-LAD ok, EF 50%;  d.  [place] 5/14:  Inferolateral scar, EF 46%, no ischemia  . Hypercholesterolemia   . Tobacco abuse   . ETOH abuse   . GERD (gastroesophageal reflux disease)      No Known Allergies   Current Outpatient Prescriptions  Medication Sig Dispense Refill  . amLODipine (NORVASC) 5 MG tablet Take 1 tablet (5 mg total) by mouth daily.  30 tablet  11  . aspirin 81 MG tablet Take 81 mg by mouth daily.      Marland Kitchen atorvastatin (LIPITOR) 80 MG tablet Take 1 tablet (80 mg total) by mouth daily.  90 tablet  3  . indomethacin (INDOCIN) 25 MG capsule Take 50 mg by mouth 3 (three) times daily as needed.      Marland Kitchen lisinopril (PRINIVIL,ZESTRIL) 5 MG tablet Take 1 tablet (5 mg total) by mouth daily.  90 tablet  3  . metoprolol tartrate (LOPRESSOR) 25 MG tablet Take 1 tablet (25 mg total) by mouth 2 (two) times daily.  60 tablet   11  . nitroGLYCERIN (NITROSTAT) 0.4 MG SL tablet Place 1 tablet (0.4 mg total) under the tongue every 5 (five) minutes as needed for chest pain.  10 tablet  0  . omega-3 acid ethyl esters (LOVAZA) 1 G capsule Take 1 g by mouth 2 (two) times daily.      Marland Kitchen omeprazole (PRILOSEC) 20 MG capsule Take 1 capsule (20 mg total) by mouth daily.  30 capsule  3   No current facility-administered medications for this visit.     Past Surgical History  Procedure Laterality Date  . Prostate surgery    . Cardiac surgery    . Colonoscopy      in remote past, Dr. Sharlett Iles. Obtaining records.   . Back surgery    . Esophagogastroduodenoscopy  05/27/2012    [id number] esophagus, stomach and duodenum s/p dilator     No Known Allergies    Family History  Problem Relation Age of Onset  . Colon cancer Neg Hx   . Diabetes Mother      Social History Mr. Aro reports that he has been smoking Cigarettes.  He has a 50 pack-year smoking history. He does not have any smokeless tobacco history on file. Mr. Blount reports that he drinks alcohol.   Review of Systems CONSTITUTIONAL: No weight loss, fever, chills, weakness or fatigue.  HEENT: Eyes: No visual loss, blurred vision, double vision or yellow sclerae.No hearing loss, sneezing, congestion, runny nose or sore throat.  SKIN: No rash or itching.  CARDIOVASCULAR:  RESPIRATORY: No shortness of breath, cough or sputum.  GASTROINTESTINAL: No anorexia, nausea, vomiting or diarrhea. No abdominal pain or blood.  GENITOURINARY: No burning on urination, no polyuria NEUROLOGICAL: No headache, dizziness, syncope, paralysis, ataxia, numbness or tingling in the extremities. No change in bowel or bladder control.  MUSCULOSKELETAL: No muscle, back pain, joint pain or stiffness.  LYMPHATICS: No enlarged nodes. No history of splenectomy.  PSYCHIATRIC: No history of depression or anxiety.  ENDOCRINOLOGIC: No reports of sweating, cold or heat intolerance. No polyuria  or polydipsia.  Marland Kitchen   Physical Examination p 50 bp 136/68 Wt 151 lbs BMI 24 Gen: resting comfortably, no acute distress HEENT: no scleral icterus, pupils equal round and reactive, no palptable cervical adenopathy,  CV: RRR, no m/r/g, no JVD, no carotis bruits Resp: Clear to auscultation bilaterally GI: abdomen is soft, non-tender, non-distended, normal bowel sounds, no hepatosplenomegaly MSK: extremities are warm, no edema.  Skin: warm, no rash Neuro:  no focal deficits Psych: appropriate affect   Diagnostic Studies  11/25/2009:  Cardiac Cath Findings: Left ventricular angiogram was performed in the RAO projection and  showed normal left ventricular systolic function with ejection  fraction estimated at 50%. Mild mitral regurgitation was noted.  10.Aortic root angiogram was performed and did not show enlargement of  the aortic root.  IMPRESSION:  1. Severe triple-vessel coronary artery disease.  2. Patent bypass grafts 4/4.  3. Low normal left ventricular systolic function.   12/2012 Myoview  Inferolateral scar, no active ischemia, LVEF 46%.   05/01/13 Echo: LVEF 55-60%, grade II diastolic dysfunction, multiple WMAs, mild MR,   Pertinent labs  05/29/13: K 5.4 Na 141 AST 26 ALT 16 Cr 0.90 TC 136 TG 66 HDL 60 LDL 63    Assessment and Plan  1.CAD  - recent negative myoview when he was having chest pain a few months back, pain has now resolved.  - continue current medical therapy.     2. HTN  - at goal, continue current meds  3. HL  - continue high dose statin in setting of known cardiovascular disease.  - LDL at goal  4. Tobacco  -counseled on health risk of continued smoking, advised to stop. He is to continue ecig in efforts to quit  5. Bradycardia - asymptomatic, EKG shows sinus bradycardia - will decrease lopressor to 12.5mg  bid   F/u 1 year       Arnoldo Lenis, M.D., F.A.C.C.

## 2014-02-04 NOTE — Patient Instructions (Signed)
Your physician wants you to follow-up in: 1 year with Dr [name]. You will receive a reminder letter in the mail two months in advance. If you don't receive a letter, please call our office to schedule the follow-up appointment.  Your physician has recommended you make the following change in your medication:   TAKE 12.5 MG (1/2 TABLET) OF METOPROLOL TWICE DAILY  .Thank you for choosing [facility]!!

## 2015-01-14 ENCOUNTER — Other Ambulatory Visit: Payer: Self-pay | Admitting: *Deleted

## 2015-01-14 MED ORDER — OMEGA-3-ACID ETHYL ESTERS 1 G PO CAPS: 1.0000 g | ORAL_CAPSULE | Freq: Two times a day (BID) | ORAL | Status: DC

## 2015-03-09 ENCOUNTER — Ambulatory Visit (INDEPENDENT_AMBULATORY_CARE_PROVIDER_SITE_OTHER): Payer: Medicare Other | Admitting: Cardiology

## 2015-03-09 ENCOUNTER — Encounter: Payer: Self-pay | Admitting: Cardiology

## 2015-03-09 VITALS — BP 140/78 | HR 56 | Ht 67.0 in | Wt 151.0 lb

## 2015-03-09 DIAGNOSIS — I10 Essential (primary) hypertension: Secondary | ICD-10-CM | POA: Diagnosis not present

## 2015-03-09 DIAGNOSIS — Z79899 Other long term (current) drug therapy: Secondary | ICD-10-CM | POA: Diagnosis not present

## 2015-03-09 DIAGNOSIS — R7309 Other abnormal glucose: Secondary | ICD-10-CM

## 2015-03-09 DIAGNOSIS — I2510 Atherosclerotic heart disease of native coronary artery without angina pectoris: Secondary | ICD-10-CM

## 2015-03-09 DIAGNOSIS — Z136 Encounter for screening for cardiovascular disorders: Secondary | ICD-10-CM

## 2015-03-09 MED ORDER — ATORVASTATIN CALCIUM 80 MG PO TABS: 80.0000 mg | ORAL_TABLET | Freq: Every day | ORAL | Status: DC

## 2015-03-09 MED ORDER — AMLODIPINE BESYLATE 5 MG PO TABS: 5.0000 mg | ORAL_TABLET | Freq: Every day | ORAL | Status: DC

## 2015-03-09 MED ORDER — METOPROLOL TARTRATE 25 MG PO TABS: 12.5000 mg | ORAL_TABLET | Freq: Two times a day (BID) | ORAL | Status: DC

## 2015-03-09 MED ORDER — OMEGA-3-ACID ETHYL ESTERS 1 G PO CAPS: 1.0000 g | ORAL_CAPSULE | Freq: Two times a day (BID) | ORAL | Status: DC

## 2015-03-09 NOTE — Patient Instructions (Signed)
Your physician wants you to follow-up in: [place] DR. BRANCH. You will receive a reminder letter in the mail two months in advance. If you don't receive a letter, please call our office to schedule the follow-up appointment.  Your physician recommends that you continue on your current medications as directed. Please refer to the Current Medication list given to you today.  HAVE LAB WORK DONE  WE REFILLED YOUR MEDICATIONS WITH 90 DAY SUPPLIES  Thanks for choosing [facility]!!!

## 2015-03-09 NOTE — Progress Notes (Signed)
[name], male   DOB: 1946-07-26, 69 y.o.   MRN: [id number]     Clinical Summary [name] is a 69 y.o.male seen today for follow up of the following medical problems.   1. CAD  - prior MI in 1997, prior CABG approx 13 years ago at [facility]  - cath 2011 showed patent grafts. 04/2013 echo shows LVEF 60%  - myoview 12/2012 shows old inferolateral scar, no ischemia    - denies any chest pain. No SOB or DOE. - compliant with meds  2. HTN  - does not check at home  - compliant w/ meds  - lisionpril stopped by pcp per patient report  3. HL  - compliant with statin  4. Sinus bradycardia - last visit decreased lopressor to 12.5mg bid.  - denies any symptoms   Past Medical History  Diagnosis Date  . Prostate cancer   . Hypertension   . CAD (coronary artery disease)     a. s/p INF MI 1997;  b. s/p CABG 2001;  c. [place] 11/2009:  3v CAD, S-PDA ok with 40% mid, S-OM ok, S-Dx ok, L-LAD ok, EF 50%;  d.  [place] 5/14:  Inferolateral scar, EF 46%, no ischemia  . Hypercholesterolemia   . Tobacco abuse   . ETOH abuse   . GERD (gastroesophageal reflux disease)      No Known Allergies   Current Outpatient Prescriptions  Medication Sig Dispense Refill  . amLODipine (NORVASC) 5 MG tablet Take 1 tablet (5 mg total) by mouth daily. 90 tablet 6  . aspirin 81 MG tablet Take 81 mg by mouth daily.    Marland Kitchen atorvastatin (LIPITOR) 80 MG tablet Take 1 tablet (80 mg total) by mouth daily. 90 tablet 6  . indomethacin (INDOCIN) 25 MG capsule Take 50 mg by mouth 3 (three) times daily as needed.    Marland Kitchen lisinopril (PRINIVIL,ZESTRIL) 5 MG tablet Take 1 tablet (5 mg total) by mouth daily. 90 tablet 6  . metoprolol tartrate (LOPRESSOR) 25 MG tablet Take 0.5 tablets (12.5 mg total) by mouth 2 (two) times daily. 90 tablet 6  . nitroGLYCERIN (NITROSTAT) 0.4 MG SL tablet Place 1 tablet (0.4 mg total) under the tongue every 5 (five) minutes as needed for chest pain. 10 tablet 0  . omega-3 acid  ethyl esters (LOVAZA) 1 G capsule Take 1 capsule (1 g total) by mouth 2 (two) times daily. 90 capsule 0  . omeprazole (PRILOSEC) 20 MG capsule Take 1 capsule (20 mg total) by mouth daily. 30 capsule 3   No current facility-administered medications for this visit.     Past Surgical History  Procedure Laterality Date  . Prostate surgery    . Cardiac surgery    . Colonoscopy      in remote past, Dr. Sharlett Iles. Obtaining records.   . Back surgery    . Esophagogastroduodenoscopy  05/27/2012    [id number] esophagus, stomach and duodenum s/p dilator     No Known Allergies    Family History  Problem Relation Age of Onset  . Colon cancer Neg Hx   . Diabetes Mother      Social History [name] reports that he has been smoking Cigarettes.  He has a 50 pack-year smoking history. He does not have any smokeless tobacco history on file. [name] reports that he drinks alcohol.   Review of Systems CONSTITUTIONAL: No weight loss, fever, chills, weakness or fatigue.  HEENT: Eyes: No visual loss, blurred vision,  double vision or yellow sclerae.No hearing loss, sneezing, congestion, runny nose or sore throat.  SKIN: No rash or itching.  CARDIOVASCULAR: per HPI RESPIRATORY: No shortness of breath, cough or sputum.  GASTROINTESTINAL: No anorexia, nausea, vomiting or diarrhea. No abdominal pain or blood.  GENITOURINARY: No burning on urination, no polyuria NEUROLOGICAL: No headache, dizziness, syncope, paralysis, ataxia, numbness or tingling in the extremities. No change in bowel or bladder control.  MUSCULOSKELETAL: No muscle, back pain, joint pain or stiffness.  LYMPHATICS: No enlarged nodes. No history of splenectomy.  PSYCHIATRIC: No history of depression or anxiety.  ENDOCRINOLOGIC: No reports of sweating, cold or heat intolerance. No polyuria or polydipsia.  Marland Kitchen   Physical Examination Filed Vitals:   03/09/15 1151  BP: 140/78  Pulse: 56   Filed Vitals:   03/09/15 1151    Height: 5\' 7"  (1.702 m)  Weight: 151 lb (68.493 kg)    Gen: resting comfortably, no acute distress HEENT: no scleral icterus, pupils equal round and reactive, no palptable cervical adenopathy,  CV: RRR, no m/r/g, no JVD Resp: Clear to auscultation bilaterally GI: abdomen is soft, non-tender, non-distended, normal bowel sounds, no hepatosplenomegaly MSK: extremities are warm, no edema.  Skin: warm, no rash Neuro:  no focal deficits Psych: appropriate affect   Diagnostic Studies 11/25/2009:  Cardiac Cath Findings: Left ventricular angiogram was performed in the RAO projection and  showed normal left ventricular systolic function with ejection  fraction estimated at 50%. Mild mitral regurgitation was noted.  10.Aortic root angiogram was performed and did not show enlargement of  the aortic root.  IMPRESSION:  1. Severe triple-vessel coronary artery disease.  2. Patent bypass grafts 4/4.  3. Low normal left ventricular systolic function.   12/2012 Myoview  Inferolateral scar, no active ischemia, LVEF 46%.   05/01/13 Echo: LVEF 55-60%, grade II diastolic dysfunction, multiple WMAs, mild MR,   Pertinent labs  05/29/13: K 5.4 Na 141 AST 26 ALT 16 Cr 0.90 TC 136 TG 66 HDL 60 LDL 63     Assessment and Plan  1.CAD  - no current symptoms, continue current meds  2. HTN  - at goal, continue current meds  3. HL  - continue high dose statin in setting of known cardiovascular disease.   4. Bradycardia - asymptomatic, EKG shows sinus bradycardia - continue beta blocker at this time for cardiac benefits, if develops symptoms will need to dc    F/u 1 year. Order annual labs  Arnoldo Lenis, M.D.

## 2015-03-13 LAB — CBC WITH DIFFERENTIAL/PLATELET
Basophils Absolute: 0.1 10*3/uL (ref 0.0–0.1)
Basophils Relative: 1 % (ref 0–1)
Eosinophils Absolute: 0.3 10*3/uL (ref 0.0–0.7)
Eosinophils Relative: 4 % (ref 0–5)
HCT: 42 % (ref 39.0–52.0)
Hemoglobin: 14.4 g/dL (ref 13.0–17.0)
Lymphocytes Relative: 39 % (ref 12–46)
Lymphs Abs: 2.5 10*3/uL (ref 0.7–4.0)
MCH: 32.1 pg (ref 26.0–34.0)
MCHC: 34.3 g/dL (ref 30.0–36.0)
MCV: 93.5 fL (ref 78.0–100.0)
MPV: 9.5 fL (ref 8.6–12.4)
Monocytes Absolute: 0.4 10*3/uL (ref 0.1–1.0)
Monocytes Relative: 6 % (ref 3–12)
Neutro Abs: 3.2 10*3/uL (ref 1.7–7.7)
Neutrophils Relative %: 50 % (ref 43–77)
Platelets: 242 10*3/uL (ref 150–400)
RBC: 4.49 MIL/uL (ref 4.22–5.81)
RDW: 14.9 % (ref 11.5–15.5)
WBC: 6.3 10*3/uL (ref 4.0–10.5)

## 2015-03-13 LAB — LIPID PANEL
Cholesterol: 101 mg/dL — ABNORMAL LOW (ref 125–200)
HDL: 39 mg/dL — ABNORMAL LOW (ref >=40)
LDL Cholesterol: 46 mg/dL (ref <130)
Total CHOL/HDL Ratio: 2.6 Ratio (ref <=5.0)
Triglycerides: 81 mg/dL (ref <150)
VLDL: 16 mg/dL (ref <30)

## 2015-03-13 LAB — COMPREHENSIVE METABOLIC PANEL
ALT: 17 U/L (ref 9–46)
AST: 21 U/L (ref 10–35)
Albumin: 3.8 g/dL (ref 3.6–5.1)
Alkaline Phosphatase: 101 U/L (ref 40–115)
BUN: 11 mg/dL (ref 7–25)
CO2: 28 mmol/L (ref 20–31)
Calcium: 9 mg/dL (ref 8.6–10.3)
Chloride: 105 mmol/L (ref 98–110)
Creat: 0.88 mg/dL (ref 0.70–1.25)
Glucose, Bld: 91 mg/dL (ref 65–99)
Potassium: 4.8 mmol/L (ref 3.5–5.3)
Sodium: 140 mmol/L (ref 135–146)
Total Bilirubin: 0.6 mg/dL (ref 0.2–1.2)
Total Protein: 6.2 g/dL (ref 6.1–8.1)

## 2015-03-13 LAB — MAGNESIUM: Magnesium: 1.6 mg/dL (ref 1.5–2.5)

## 2015-03-13 LAB — HEMOGLOBIN A1C
Hgb A1c MFr Bld: 5.9 % — ABNORMAL HIGH (ref <5.7)
Mean Plasma Glucose: 123 mg/dL — ABNORMAL HIGH (ref <117)

## 2015-03-13 LAB — TSH: TSH: 0.428 u[IU]/mL (ref 0.350–4.500)

## 2015-03-15 ENCOUNTER — Telehealth: Payer: Self-pay

## 2015-03-15 NOTE — Telephone Encounter (Signed)
PT made aware. Voice understanding. Copy to PCP

## 2015-03-15 NOTE — Telephone Encounter (Signed)
-----   Message from Arnoldo Lenis, MD sent at 03/15/2015  1:22 PM EDT ----- Labs overall look good. He does have evidence of prediabtes and needs to work on diet, exercise and weight loss to prevent developing full blown diabetes. Please forward results to pcp.   [name] MD

## 2015-04-01 ENCOUNTER — Encounter: Payer: Self-pay | Admitting: Family Medicine

## 2015-04-01 ENCOUNTER — Ambulatory Visit (INDEPENDENT_AMBULATORY_CARE_PROVIDER_SITE_OTHER): Payer: Medicare Other | Admitting: Family Medicine

## 2015-04-01 VITALS — BP 131/71 | HR 54 | Temp 97.3°F | Ht 67.0 in | Wt 150.6 lb

## 2015-04-01 DIAGNOSIS — Z720 Tobacco use: Secondary | ICD-10-CM | POA: Diagnosis not present

## 2015-04-01 DIAGNOSIS — M10071 Idiopathic gout, right ankle and foot: Secondary | ICD-10-CM

## 2015-04-01 DIAGNOSIS — M109 Gout, unspecified: Secondary | ICD-10-CM

## 2015-04-01 DIAGNOSIS — F17200 Nicotine dependence, unspecified, uncomplicated: Secondary | ICD-10-CM

## 2015-04-01 MED ORDER — ALLOPURINOL 100 MG PO TABS: 100.0000 mg | ORAL_TABLET | Freq: Every day | ORAL | Status: DC

## 2015-04-01 MED ORDER — COLCHICINE 0.6 MG PO TABS: 0.6000 mg | ORAL_TABLET | Freq: Every day | ORAL | Status: DC

## 2015-04-01 NOTE — Progress Notes (Signed)
   HPI  Patient presents today to discuss gout  Patient states that he's been having gout flares lately. He was given indomethacin in urgent care recently and requests a refill of this. He states that he had a gout flare of his right hand about one month ago that improved with indomethacin. He is now having something consistent with a flare of his right foot. He has dull achy pain in his right lateral foot along with erythema, warmth, and swelling consistent with his other gout flares. He has made diet changes to reduce the flares.  He states that colchicine as constant diarrhea previously Denies any chest pain but does have a history of a MI with CABG  PMH: Smoking status noted ROS: Per HPI, otherwise negative  Objective: BP 131/71 mmHg  Pulse 54  Temp(Src) 97.3 F (36.3 C) (Oral)  Ht 5' 7" (1.702 m)  Wt 150 lb 9.6 oz (68.312 kg)  BMI 23.58 kg/m2 Gen: NAD, alert, cooperative with exam HEENT: NCAT CV: RRR, good S1/S2, no murmur Resp: CTABL, no wheezes, non-labored Abd: SNTND, BS present, no guarding or organomegaly Ext: Right foot with some erythema and tenderness on the lateral foot, no significant swelling apparent Neuro: Alert and oriented, No gross deficits  Assessment and plan:  # Gout Discussed trying to limit NSAIDs with concomitant CAD status post CABG Start colchicine today plan to continue for 6-8 weeks while starting allopurinol Start allopurinol 100 mg daily Uric acid to try to titrate allopurinol Follow-up 6 weeks  # Tobacco abuse Interested in quitting, now using E cigarettes primarily but smoking some Encouraged to quit, although there is no clear information that E cigarettes are safe they do seem to be better than smoking. However ideally he would quit smoking completely.   Orders Placed This Encounter  Procedures  . Uric acid  . BMP8+EGFR    Meds ordered this encounter  Medications  . colchicine 0.6 MG tablet    Sig: Take 1 tablet (0.6 mg  total) by mouth daily.    Dispense:  30 tablet    Refill:  1  . allopurinol (ZYLOPRIM) 100 MG tablet    Sig: Take 1 tablet (100 mg total) by mouth daily.    Dispense:  30 tablet    Refill:  2    Sam Bradshaw, MD [facility] 04/01/2015, 12:17 PM      

## 2015-04-01 NOTE — Patient Instructions (Signed)
Great to meet you!  Start colchicine today, continue for the next 6 weeks. It will treat your acute flair  Start allopurinol, this will be the long term medicine.   Gout Gout is an inflammatory arthritis caused by a buildup of uric acid crystals in the joints. Uric acid is a chemical that is normally present in the blood. When the level of uric acid in the blood is too high it can form crystals that deposit in your joints and tissues. This causes joint redness, soreness, and swelling (inflammation). Repeat attacks are common. Over time, uric acid crystals can form into masses (tophi) near a joint, destroying bone and causing disfigurement. Gout is treatable and often preventable. CAUSES  The disease begins with elevated levels of uric acid in the blood. Uric acid is produced by your body when it breaks down a naturally found substance called purines. Certain foods you eat, such as meats and fish, contain high amounts of purines. Causes of an elevated uric acid level include:  Being passed down from parent to child (heredity).  Diseases that cause increased uric acid production (such as obesity, psoriasis, and certain cancers).  Excessive alcohol use.  Diet, especially diets rich in meat and seafood.  Medicines, including certain cancer-fighting medicines (chemotherapy), water pills (diuretics), and aspirin.  Chronic kidney disease. The kidneys are no longer able to remove uric acid well.  Problems with metabolism. Conditions strongly associated with gout include:  Obesity.  High blood pressure.  High cholesterol.  Diabetes. Not everyone with elevated uric acid levels gets gout. It is not understood why some people get gout and others do not. Surgery, joint injury, and eating too much of certain foods are some of the factors that can lead to gout attacks. SYMPTOMS   An attack of gout comes on quickly. It causes intense pain with redness, swelling, and warmth in a joint.  Fever  can occur.  Often, only one joint is involved. Certain joints are more commonly involved:  Base of the big toe.  Knee.  Ankle.  Wrist.  Finger. Without treatment, an attack usually goes away in a few days to weeks. Between attacks, you usually will not have symptoms, which is different from many other forms of arthritis. DIAGNOSIS  Your caregiver will suspect gout based on your symptoms and exam. In some cases, tests may be recommended. The tests may include:  Blood tests.  Urine tests.  X-rays.  Joint fluid exam. This exam requires a needle to remove fluid from the joint (arthrocentesis). Using a microscope, gout is confirmed when uric acid crystals are seen in the joint fluid. TREATMENT  There are two phases to gout treatment: treating the sudden onset (acute) attack and preventing attacks (prophylaxis).  Treatment of an Acute Attack.  Medicines are used. These include anti-inflammatory medicines or steroid medicines.  An injection of steroid medicine into the affected joint is sometimes necessary.  The painful joint is rested. Movement can worsen the arthritis.  You may use warm or cold treatments on painful joints, depending which works best for you.  Treatment to Prevent Attacks.  If you suffer from frequent gout attacks, your caregiver may advise preventive medicine. These medicines are started after the acute attack subsides. These medicines either help your kidneys eliminate uric acid from your body or decrease your uric acid production. You may need to stay on these medicines for a very long time.  The early phase of treatment with preventive medicine can be associated with an increase  in acute gout attacks. For this reason, during the first few months of treatment, your caregiver may also advise you to take medicines usually used for acute gout treatment. Be sure you understand your caregiver's directions. Your caregiver may make several adjustments to your medicine  dose before these medicines are effective.  Discuss dietary treatment with your caregiver or dietitian. Alcohol and drinks high in sugar and fructose and foods such as meat, poultry, and seafood can increase uric acid levels. Your caregiver or dietitian can advise you on drinks and foods that should be limited. HOME CARE INSTRUCTIONS   Do not take aspirin to relieve pain. This raises uric acid levels.  Only take over-the-counter or prescription medicines for pain, discomfort, or fever as directed by your caregiver.  Rest the joint as much as possible. When in bed, keep sheets and blankets off painful areas.  Keep the affected joint raised (elevated).  Apply warm or cold treatments to painful joints. Use of warm or cold treatments depends on which works best for you.  Use crutches if the painful joint is in your leg.  Drink enough fluids to keep your urine clear or pale yellow. This helps your body get rid of uric acid. Limit alcohol, sugary drinks, and fructose drinks.  Follow your dietary instructions. Pay careful attention to the amount of protein you eat. Your daily diet should emphasize fruits, vegetables, whole grains, and fat-free or low-fat milk products. Discuss the use of coffee, vitamin C, and cherries with your caregiver or dietitian. These may be helpful in lowering uric acid levels.  Maintain a healthy body weight. SEEK MEDICAL CARE IF:   You develop diarrhea, vomiting, or any side effects from medicines.  You do not feel better in 24 hours, or you are getting worse. SEEK IMMEDIATE MEDICAL CARE IF:   Your joint becomes suddenly more tender, and you have chills or a fever. MAKE SURE YOU:   Understand these instructions.  Will watch your condition.  Will get help right away if you are not doing well or get worse. Document Released: 07/21/2000 Document Revised: 12/08/2013 Document Reviewed: 03/06/2012 [facility] Patient Information 2015 [place], [place]. This information  is not intended to replace advice given to you by your health care provider. Make sure you discuss any questions you have with your health care provider.

## 2015-04-02 LAB — BMP8+EGFR
BUN/Creatinine Ratio: 11 (ref 10–22)
BUN: 8 mg/dL (ref 8–27)
CO2: 24 mmol/L (ref 18–29)
Calcium: 9.5 mg/dL (ref 8.6–10.2)
Chloride: 100 mmol/L (ref 97–108)
Creatinine, Ser: 0.73 mg/dL — ABNORMAL LOW (ref 0.76–1.27)
GFR calc Af Amer: 111 mL/min/{1.73_m2} (ref >59)
GFR calc non Af Amer: 96 mL/min/{1.73_m2} (ref >59)
Glucose: 104 mg/dL — ABNORMAL HIGH (ref 65–99)
Potassium: 4.1 mmol/L (ref 3.5–5.2)
Sodium: 141 mmol/L (ref 134–144)

## 2015-04-02 LAB — URIC ACID: Uric Acid: 6.3 mg/dL (ref 3.7–8.6)

## 2015-06-14 ENCOUNTER — Ambulatory Visit (INDEPENDENT_AMBULATORY_CARE_PROVIDER_SITE_OTHER): Payer: Medicare Other | Admitting: Pediatrics

## 2015-06-14 ENCOUNTER — Encounter: Payer: Self-pay | Admitting: Pediatrics

## 2015-06-14 VITALS — BP 141/67 | HR 59 | Temp 97.6°F | Ht 67.0 in | Wt 150.6 lb

## 2015-06-14 DIAGNOSIS — I10 Essential (primary) hypertension: Secondary | ICD-10-CM

## 2015-06-14 DIAGNOSIS — M10071 Idiopathic gout, right ankle and foot: Secondary | ICD-10-CM | POA: Diagnosis not present

## 2015-06-14 DIAGNOSIS — M109 Gout, unspecified: Secondary | ICD-10-CM

## 2015-06-14 MED ORDER — ALLOPURINOL 100 MG PO TABS: 100.0000 mg | ORAL_TABLET | Freq: Every day | ORAL | Status: DC

## 2015-06-14 MED ORDER — COLCHICINE 0.6 MG PO TABS: 0.6000 mg | ORAL_TABLET | Freq: Every day | ORAL | Status: DC

## 2015-06-14 NOTE — Patient Instructions (Signed)
Allopurinol every day, goal is to be on just this medicine Colchicine (colchrys) every day for next 3 months while we are starting the allopurinol

## 2015-06-14 NOTE — Progress Notes (Signed)
Subjective:    [name], male    DOB: 12/20/1945, 69 y.o.   MRN: [id number]  CC: gout flare  HPI: [name] is a 69 y.o. male presenting on 06/14/2015 for Gout  Has had gout flares apprx 1 a week for past month. Was seen 10 weeks ago and started on allopurinol and colchicine. Indomethacin also has worked in the past for gout. He says he takes the white pill every day and only takes the purple pill when he has flares.  Yesterday in the afternoon started noticing R ankle hurting, this morning when he woke up was much more painful, he can walk on it. No redness. He says he has had joint effusion analyzed in the past that showed gout   Relevant past medical, surgical, family and social history reviewed and updated as indicated. Interim medical history since our last visit reviewed. Allergies and medications reviewed and updated.   ROS: Per HPI unless specifically indicated above  Past Medical History Patient Active Problem List   Diagnosis Date Noted  . Essential hypertension, malignant 11/26/2012  . GERD (gastroesophageal reflux disease) 11/26/2012  . Gout 11/26/2012  . CAD (coronary artery disease) 11/26/2012  . Chest pain 11/26/2012  . Hyperkalemia 11/26/2012  . Hyponatremia 11/26/2012  . Dysphagia 05/02/2012  . BRADYCARDIA 12/06/2009  . HYPERLIPIDEMIA 12/03/2009  . ETHANOL ABUSE 12/03/2009  . TOBACCO USER 12/03/2009  . HYPERTENSION 12/03/2009    Current Outpatient Prescriptions  Medication Sig Dispense Refill  . allopurinol (ZYLOPRIM) 100 MG tablet Take 1 tablet (100 mg total) by mouth daily. 30 tablet 3  . amLODipine (NORVASC) 5 MG tablet Take 1 tablet (5 mg total) by mouth daily. 90 tablet 6  . aspirin 81 MG tablet Take 81 mg by mouth daily.    Marland Kitchen atorvastatin (LIPITOR) 80 MG tablet Take 1 tablet (80 mg total) by mouth daily. 90 tablet 6  . colchicine 0.6 MG tablet Take 1 tablet (0.6 mg total) by mouth daily. 30 tablet 3  . metoprolol tartrate  (LOPRESSOR) 25 MG tablet Take 0.5 tablets (12.5 mg total) by mouth 2 (two) times daily. 90 tablet 6  . omega-3 acid ethyl esters (LOVAZA) 1 G capsule Take 1 capsule (1 g total) by mouth 2 (two) times daily. 90 capsule 6  . omeprazole (PRILOSEC) 20 MG capsule Take 1 capsule (20 mg total) by mouth daily. 30 capsule 3   No current facility-administered medications for this visit.       Objective:    BP 141/67 mmHg  Pulse 59  Temp(Src) 97.6 F (36.4 C) (Oral)  Ht 5\' 7"  (1.702 m)  Wt 150 lb 9.6 oz (68.312 kg)  BMI 23.58 kg/m2  Wt Readings from Last 3 Encounters:  06/14/15 150 lb 9.6 oz (68.312 kg)  04/01/15 150 lb 9.6 oz (68.312 kg)  03/09/15 151 lb (68.493 kg)    Gen: NAD, alert, cooperative with exam, NCAT EYES: EOMI, no scleral injection or icterus CV: NRRR, normal S1/S2, no murmur, distal pulses 2+ b/l Resp: CTABL, no wheezes, normal WOB Ext: WWP Neuro: Alert and oriented, strength equal b/l UE and LE, coordination grossly normal MSK: R ankle slightly swollen compared with L, no redness. Pain with ROM, able to move it in all directions, decreased ROM compared with L.     Assessment & Plan:    [name] was seen today for gout, has been taking either allopurinol or colchicine at home, not both. Has regular flares. 8 weeks  ago he was started on both with hope of decreasing number of flares, his uric acid during a flare at last visit was normal, which is not unexpectd. Will not check today as he is also in an acute flare. Continue both allopurinol and colchicine, let us know if not improving. RTC for uric acid check in 2-3 months. Also BP borderline today, recheck next visit. Continue current meds.  Diagnoses and all orders for this visit:  Acute gout of right foot, unspecified cause -     allopurinol (ZYLOPRIM) 100 MG tablet; Take 1 tablet (100 mg total) by mouth daily. -     colchicine 0.6 MG tablet; Take 1 tablet (0.6 mg total) by mouth daily.  Essential  hypertension    Follow up plan: Return in about 3 months (around 09/14/2015) for gout follow up.  Assunta Found, MD [facility] 06/14/2015, 3:17 PM

## 2015-06-15 DIAGNOSIS — I10 Essential (primary) hypertension: Secondary | ICD-10-CM | POA: Insufficient documentation

## 2015-06-30 ENCOUNTER — Telehealth: Payer: Self-pay | Admitting: Family Medicine

## 2015-08-18 ENCOUNTER — Ambulatory Visit (INDEPENDENT_AMBULATORY_CARE_PROVIDER_SITE_OTHER): Payer: Medicare Other | Admitting: Pediatrics

## 2015-08-18 ENCOUNTER — Encounter: Payer: Self-pay | Admitting: Pediatrics

## 2015-08-18 VITALS — BP 141/75 | HR 61 | Temp 97.0°F | Ht 67.0 in | Wt 155.2 lb

## 2015-08-18 DIAGNOSIS — Z8546 Personal history of malignant neoplasm of prostate: Secondary | ICD-10-CM

## 2015-08-18 DIAGNOSIS — Z8639 Personal history of other endocrine, nutritional and metabolic disease: Secondary | ICD-10-CM

## 2015-08-18 DIAGNOSIS — N400 Enlarged prostate without lower urinary tract symptoms: Secondary | ICD-10-CM

## 2015-08-18 DIAGNOSIS — M10071 Idiopathic gout, right ankle and foot: Secondary | ICD-10-CM

## 2015-08-18 DIAGNOSIS — F17200 Nicotine dependence, unspecified, uncomplicated: Secondary | ICD-10-CM | POA: Diagnosis not present

## 2015-08-18 DIAGNOSIS — I10 Essential (primary) hypertension: Secondary | ICD-10-CM

## 2015-08-18 DIAGNOSIS — M109 Gout, unspecified: Secondary | ICD-10-CM

## 2015-08-18 DIAGNOSIS — Z8739 Personal history of other diseases of the musculoskeletal system and connective tissue: Secondary | ICD-10-CM

## 2015-08-18 MED ORDER — TAMSULOSIN HCL 0.4 MG PO CAPS: 0.4000 mg | ORAL_CAPSULE | Freq: Every day | ORAL | Status: DC

## 2015-08-18 MED ORDER — COLCHICINE 0.6 MG PO TABS: 0.6000 mg | ORAL_TABLET | Freq: Every day | ORAL | Status: DC

## 2015-08-18 MED ORDER — ALLOPURINOL 100 MG PO TABS: 100.0000 mg | ORAL_TABLET | Freq: Every day | ORAL | Status: DC

## 2015-08-18 NOTE — Progress Notes (Signed)
Subjective:    [name], male    DOB: April 01, 1946, 70 y.o.   MRN: [id number]  CC: Follow-up multiple med problems  HPI: [name] is a 70 y.o. male presenting for Follow-up  Sometimes has a hard time starting urine H/o prostate cancer, had a seed placed 5-6 yrs ago, has not followed up with urology since then.  Using bathroom more often, sometimes at night but not every night Has a harder time starting a stream than usual, especially if he waits too long Will have some dribbling, not a steady stream  No fevers, night sweats, or weight changes Has been feeling well in general Appetite has been normal  Has not had a gout flare in several weeks Usually has a couple flares in a month, recently started on allopurinol/colchicine, stopped both several weeks ago   Depression screen [facility] 2/9 08/18/2015 06/14/2015 04/01/2015  Decreased Interest 0 0 0  Down, Depressed, Hopeless 0 0 0  PHQ - 2 Score 0 0 0     Relevant past medical, surgical, family and social history reviewed and updated as indicated. Interim medical history since our last visit reviewed. Allergies and medications reviewed and updated.    ROS: Per HPI unless specifically indicated above  History  Smoking status  . Current Every Day Smoker -- 1.00 packs/day for 50 years  . Types: Cigarettes, E-cigarettes  . Start date: 03/08/1970  Smokeless tobacco  . Former Systems developer  . Types: Snuff  . Quit date: 03/08/2012    Past Medical History Patient Active Problem List   Diagnosis Date Noted  . H/O: gout 08/18/2015  . Essential hypertension 06/15/2015  . GERD (gastroesophageal reflux disease) 11/26/2012  . Gout 11/26/2012  . CAD (coronary artery disease) 11/26/2012  . Chest pain 11/26/2012  . Hyperkalemia 11/26/2012  . Hyponatremia 11/26/2012  . Dysphagia 05/02/2012  . BRADYCARDIA 12/06/2009  . HYPERLIPIDEMIA 12/03/2009  . ETHANOL ABUSE 12/03/2009  . TOBACCO USER 12/03/2009  . HYPERTENSION  12/03/2009    Current Outpatient Prescriptions  Medication Sig Dispense Refill  . allopurinol (ZYLOPRIM) 100 MG tablet Take 1 tablet (100 mg total) by mouth daily. 30 tablet 3  . amLODipine (NORVASC) 5 MG tablet Take 1 tablet (5 mg total) by mouth daily. 90 tablet 6  . aspirin 81 MG tablet Take 81 mg by mouth daily.    Marland Kitchen atorvastatin (LIPITOR) 80 MG tablet Take 1 tablet (80 mg total) by mouth daily. 90 tablet 6  . colchicine 0.6 MG tablet Take 1 tablet (0.6 mg total) by mouth daily. 14 tablet 3  . metoprolol tartrate (LOPRESSOR) 25 MG tablet Take 0.5 tablets (12.5 mg total) by mouth 2 (two) times daily. 90 tablet 6  . omega-3 acid ethyl esters (LOVAZA) 1 G capsule Take 1 capsule (1 g total) by mouth 2 (two) times daily. 90 capsule 6  . omeprazole (PRILOSEC) 20 MG capsule Take 1 capsule (20 mg total) by mouth daily. 30 capsule 3  . tamsulosin (FLOMAX) 0.4 MG CAPS capsule Take 1 capsule (0.4 mg total) by mouth daily. 30 capsule 3   No current facility-administered medications for this visit.       Objective:    BP 141/75 mmHg  Pulse 61  Temp(Src) 97 F (36.1 C) (Oral)  Ht 5' 7"  (1.702 m)  Wt 155 lb 3.2 oz (70.398 kg)  BMI 24.30 kg/m2  Wt Readings from Last 3 Encounters:  08/18/15 155 lb 3.2 oz (70.398 kg)  06/14/15 150  lb 9.6 oz (68.312 kg)  04/01/15 150 lb 9.6 oz (68.312 kg)     Gen: NAD, alert, cooperative with exam, NCAT EYES: EOMI, no scleral injection or icterus ENT:   OP without erythema LYMPH: no cervical LAD CV: NRRR, normal S1/S2, no murmur, distal pulses 2+ b/l Resp: CTABL, no wheezes, normal WOB Abd: +BS, soft, NTND. no guarding or organomegaly Ext: No edema, warm Neuro: Alert and oriented     Assessment & Plan:    [name] was seen today for follow-up multiple med problems.  Diagnoses and all orders for this visit:  H/O prostate cancer -     Ambulatory referral to Urology -     PSA, total and free  Essential hypertension Adequate control today. Continue  current meds. WIll cehck labs today -     BMP8+EGFR  TOBACCO USER Continue to encourage cessation  H/O: gout Will check level. Frequent exacerbations. Allopurinol 176m daily, start with colchicine for 2 weeks, then continue allopurinol. Let me know if any side effects. May need increase in allopurinol.  -     Uric acid -     allopurinol (ZYLOPRIM) 100 MG tablet; Take 1 tablet (100 mg total) by mouth daily. -     colchicine 0.6 MG tablet; Take 1 tablet (0.6 mg total) by mouth daily.  BPH (benign prostatic hyperplasia) Increasing symptoms. Sending PSA, also needs urology f/u for seed placement and cancer h/o, lost to follow up. -     tamsulosin (FLOMAX) 0.4 MG CAPS capsule; Take 1 capsule (0.4 mg total) by mouth daily.    Follow up plan: Return in about 6 months (around 02/15/2016).  CAssunta Found MD [facility] 08/18/2015, 12:45 PM

## 2015-08-19 LAB — BMP8+EGFR
BUN/Creatinine Ratio: 13 (ref 10–22)
BUN: 12 mg/dL (ref 8–27)
CO2: 28 mmol/L (ref 18–29)
Calcium: 9.7 mg/dL (ref 8.6–10.2)
Chloride: 102 mmol/L (ref 96–106)
Creatinine, Ser: 0.93 mg/dL (ref 0.76–1.27)
GFR calc Af Amer: 98 mL/min/{1.73_m2} (ref >59)
GFR calc non Af Amer: 85 mL/min/{1.73_m2} (ref >59)
Glucose: 91 mg/dL (ref 65–99)
Potassium: 5.1 mmol/L (ref 3.5–5.2)
Sodium: 143 mmol/L (ref 134–144)

## 2015-08-19 LAB — PSA, TOTAL AND FREE
PSA, Free: <0.01 ng/mL
Prostate Specific Ag, Serum: <0.1 ng/mL (ref 0.0–4.0)

## 2015-08-19 LAB — URIC ACID: Uric Acid: 7.3 mg/dL (ref 3.7–8.6)

## 2015-09-02 ENCOUNTER — Telehealth: Payer: Self-pay | Admitting: Pediatrics

## 2015-09-07 DIAGNOSIS — Z0000 Encounter for general adult medical examination without abnormal findings: Secondary | ICD-10-CM | POA: Diagnosis not present

## 2015-09-07 DIAGNOSIS — R3912 Poor urinary stream: Secondary | ICD-10-CM | POA: Diagnosis not present

## 2015-09-23 ENCOUNTER — Encounter: Payer: Self-pay | Admitting: Pediatrics

## 2015-09-23 ENCOUNTER — Ambulatory Visit (INDEPENDENT_AMBULATORY_CARE_PROVIDER_SITE_OTHER): Payer: Medicare Other | Admitting: Pediatrics

## 2015-09-23 VITALS — BP 133/70 | HR 58 | Temp 96.6°F | Ht 67.0 in | Wt 155.6 lb

## 2015-09-23 DIAGNOSIS — M542 Cervicalgia: Secondary | ICD-10-CM

## 2015-09-23 MED ORDER — CYCLOBENZAPRINE HCL 5 MG PO TABS: 5.0000 mg | ORAL_TABLET | Freq: Three times a day (TID) | ORAL | Status: DC | PRN

## 2015-09-23 NOTE — Progress Notes (Signed)
Subjective:    [name], male    DOB: 07-Sep-1945, 70 y.o.   MRN: [id number]  CC: Neck Pain and Shoulder Pain   HPI: [name] is a 70 y.o. male presenting for Neck Pain and Shoulder Pain  Past month having pain in neck and R shoulder, goes up to his ear Wonders if it coul dbe how he is sleeping Does stay fairly active Does not remember overdoing exercise or lifting with arm but does not hurts now more with use of R arm   Depression screen [facility] 2/9 09/23/2015 08/18/2015 06/14/2015 04/01/2015  Decreased Interest 0 0 0 0  Down, Depressed, Hopeless 0 0 0 0  PHQ - 2 Score 0 0 0 0     Relevant past medical, surgical, family and social history reviewed and updated as indicated. Interim medical history since our last visit reviewed. Allergies and medications reviewed and updated.    ROS: Per HPI unless specifically indicated above  History  Smoking status  . Current Every Day Smoker -- 1.00 packs/day for 50 years  . Types: Cigarettes, E-cigarettes  . Start date: 03/08/1970  Smokeless tobacco  . Former Systems developer  . Types: Snuff  . Quit date: 03/08/2012    Past Medical History Patient Active Problem List   Diagnosis Date Noted  . H/O: gout 08/18/2015  . Essential hypertension 06/15/2015  . GERD (gastroesophageal reflux disease) 11/26/2012  . Gout 11/26/2012  . CAD (coronary artery disease) 11/26/2012  . Chest pain 11/26/2012  . Hyperkalemia 11/26/2012  . Hyponatremia 11/26/2012  . Dysphagia 05/02/2012  . BRADYCARDIA 12/06/2009  . HYPERLIPIDEMIA 12/03/2009  . ETHANOL ABUSE 12/03/2009  . TOBACCO USER 12/03/2009  . HYPERTENSION 12/03/2009    Current Outpatient Prescriptions  Medication Sig Dispense Refill  . allopurinol (ZYLOPRIM) 100 MG tablet Take 1 tablet (100 mg total) by mouth daily. 30 tablet 3  . amLODipine (NORVASC) 5 MG tablet Take 1 tablet (5 mg total) by mouth daily. 90 tablet 6  . aspirin 81 MG tablet Take 81 mg by mouth daily.    Marland Kitchen  atorvastatin (LIPITOR) 80 MG tablet Take 1 tablet (80 mg total) by mouth daily. 90 tablet 6  . colchicine 0.6 MG tablet Take 1 tablet (0.6 mg total) by mouth daily. 14 tablet 3  . metoprolol tartrate (LOPRESSOR) 25 MG tablet Take 0.5 tablets (12.5 mg total) by mouth 2 (two) times daily. 90 tablet 6  . omega-3 acid ethyl esters (LOVAZA) 1 G capsule Take 1 capsule (1 g total) by mouth 2 (two) times daily. 90 capsule 6  . tamsulosin (FLOMAX) 0.4 MG CAPS capsule Take 1 capsule (0.4 mg total) by mouth daily. 30 capsule 3  . cyclobenzaprine (FLEXERIL) 5 MG tablet Take 1 tablet (5 mg total) by mouth 3 (three) times daily as needed for muscle spasms. 60 tablet 0  . omeprazole (PRILOSEC) 20 MG capsule Take 1 capsule (20 mg total) by mouth daily. 30 capsule 3   No current facility-administered medications for this visit.       Objective:    BP 133/70 mmHg  Pulse 58  Temp(Src) 96.6 F (35.9 C) (Oral)  Ht 5\' 7"  (1.702 m)  Wt 155 lb 9.6 oz (70.58 kg)  BMI 24.36 kg/m2  Wt Readings from Last 3 Encounters:  09/23/15 155 lb 9.6 oz (70.58 kg)  08/18/15 155 lb 3.2 oz (70.398 kg)  06/14/15 150 lb 9.6 oz (68.312 kg)     Gen: NAD, alert, cooperative  with exam, NCAT EYES: EOMI, no scleral injection or icterus ENT:  TMs pearly gray b/l, OP without erythema LYMPH: no cervical LAD CV: NRRR, normal S1/S2, no murmur, distal pulses 2+ b/l Resp: CTABL, no wheezes, normal WOB Abd: +BS, soft, NTND. no guarding or organomegaly Ext: No edema, warm Neuro: Alert and oriented MSK: normal muscle bulk, shoulders normal to inspection b/l, normal ROM and strenght b/l shoulders, rotator cuff muscles intact. Pain R SCM with turning neck both to R and L and tilting L and R. Decreased ROM overall in neck. TTP soft tissue over trapezius on R side of neck     Assessment & Plan:    [name] was seen today for neck pain and shoulder pain.  Diagnoses and all orders for this visit:  Neck pain -     cyclobenzaprine  (FLEXERIL) 5 MG tablet; Take 1 tablet (5 mg total) by mouth 3 (three) times daily as needed for muscle spasms.     Follow up plan: Return in about 5 months (around 02/20/2016) for CPE.  Assunta Found, MD [facility] 09/23/2015, 10:15 AM

## 2015-10-15 ENCOUNTER — Other Ambulatory Visit: Payer: Self-pay | Admitting: Pediatrics

## 2015-10-29 ENCOUNTER — Encounter: Payer: Self-pay | Admitting: Pediatrics

## 2015-10-29 ENCOUNTER — Ambulatory Visit (INDEPENDENT_AMBULATORY_CARE_PROVIDER_SITE_OTHER): Payer: Medicare Other | Admitting: Pediatrics

## 2015-10-29 VITALS — BP 154/81 | HR 60 | Temp 97.7°F | Ht 67.0 in | Wt 159.6 lb

## 2015-10-29 DIAGNOSIS — G629 Polyneuropathy, unspecified: Secondary | ICD-10-CM | POA: Diagnosis not present

## 2015-10-29 DIAGNOSIS — M542 Cervicalgia: Secondary | ICD-10-CM

## 2015-10-29 DIAGNOSIS — R7303 Prediabetes: Secondary | ICD-10-CM | POA: Diagnosis not present

## 2015-10-29 DIAGNOSIS — Z7289 Other problems related to lifestyle: Secondary | ICD-10-CM | POA: Diagnosis not present

## 2015-10-29 DIAGNOSIS — F1010 Alcohol abuse, uncomplicated: Secondary | ICD-10-CM | POA: Diagnosis not present

## 2015-10-29 DIAGNOSIS — I10 Essential (primary) hypertension: Secondary | ICD-10-CM | POA: Diagnosis not present

## 2015-10-29 LAB — BAYER DCA HB A1C WAIVED: HB A1C (BAYER DCA - WAIVED): 5.5 % (ref <7.0)

## 2015-10-29 NOTE — Progress Notes (Signed)
Subjective:    [name], male    DOB: 1946-02-08, 70 y.o.   MRN: [id number]  CC: Neck Pain and Numbness   HPI: [name] is a 70 y.o. male presenting for Neck Pain and Numbness  Distal finger tips on all fingers are numb and cold feeling compared with other fingers No numbness in toes No difference between R/L fingers or hands No weakness in muscles of UE that he has noticed Not dropping things  Still has R shoulder and R side of neck pain that bothers him primarily when he is sleeping and laying on the R side Not sure if the flexeril is helping or not No shooting pains down arms  Drinks 6+ beers daily. Says he has gone a week without drinking and had no withdrawal symptoms Open to cutting back, says he enjoys it though Wife present with him at appt says she does not drink  Continues to smoke regularly, not interested in quitting now  HTN: doesn't check at home, going to get cuff for him and wife to use No HA, no vision changes, no chest pain. Normal walking, no weakness.   Depression screen [facility] 2/9 10/29/2015 09/23/2015 08/18/2015 06/14/2015 04/01/2015  Decreased Interest 0 0 0 0 0  Down, Depressed, Hopeless 0 0 0 0 0  PHQ - 2 Score 0 0 0 0 0     Relevant past medical, surgical, family and social history reviewed and updated as indicated. Interim medical history since our last visit reviewed. Allergies and medications reviewed and updated.    ROS: Per HPI unless specifically indicated above     Objective:    BP 154/81 mmHg  Pulse 60  Temp(Src) 97.7 F (36.5 C) (Oral)  Ht _0  (1.702 m)  Wt 159 lb 9.6 oz (72.394 kg)  BMI 24.99 kg/m2  Wt Readings from Last 3 Encounters:  10/29/15 159 lb 9.6 oz (72.394 kg)  09/23/15 155 lb 9.6 oz (70.58 kg)  08/18/15 155 lb 3.2 oz (70.398 kg)  Recheck 154/81   Gen: NAD, alert, cooperative with exam, NCAT EYES: EOMI, no scleral injection or icterus CV: NRRR, normal S1/S2, no murmur Resp: CTABL, no  wheezes, normal WOB Abd: soft, NTND.  Ext: No edema, warm Neuro: Alert and oriented, strength equal b/l UE and LE, some sensation present in finger tips with touch and monofilament, throughout but decreased compared to palm sensation to touch and monofilament, no fingers worse than other fingers, de MSK: normal ROM b/l shoulders. TTP soft tissue over shoulder and R side of neck. No spine point tenderness. Neg spurlings.     Assessment & Plan:    [name] was seen today for neck pain and numbness in finger tips.   Diagnoses and all orders for this visit:  Neuropathy ([place]) Present in finger tips, ongoing for over a year.. Long hx of alcohol use/abuse, may be contributing to symptoms but will check below labs. Has had pre-diabetes at last HgA1c check within last year. -     Vitamin B12 -     Folate -     TSH -     Bayer DCA Hb A1c Waived -     Hepatitis C Antibody -     CMP14+EGFR -     CBC with Differential/Platelet  Essential hypertension Elevated today, improved with recheck. Taking meds daily. Check at home, let me know if still elevated, may need increase in amlodipine.  Alcohol abuse Encouraged cutting back. Discussed  EtOH withdrawal symptoms, offered asssitance with quitting. Pt is going to think about it.  Neck pain Likely muscular, can take 50m flexeril at night, continue gentle neck exercise, if still bothering him can refer to PT.    Follow up plan: Return in about 4 weeks (around 11/26/2015).  CAssunta Found MD [facility] 10/30/2015, 8:37 AM

## 2015-10-30 DIAGNOSIS — M542 Cervicalgia: Secondary | ICD-10-CM | POA: Insufficient documentation

## 2015-10-30 LAB — TSH: TSH: 0.514 u[IU]/mL (ref 0.450–4.500)

## 2015-10-30 LAB — CBC WITH DIFFERENTIAL/PLATELET
Basophils Absolute: 0 10*3/uL (ref 0.0–0.2)
Basos: 1 %
EOS (ABSOLUTE): 0.2 10*3/uL (ref 0.0–0.4)
Eos: 3 %
Hematocrit: 42.4 % (ref 37.5–51.0)
Hemoglobin: 14.3 g/dL (ref 12.6–17.7)
Immature Grans (Abs): 0 10*3/uL (ref 0.0–0.1)
Immature Granulocytes: 0 %
Lymphocytes Absolute: 2.3 10*3/uL (ref 0.7–3.1)
Lymphs: 39 %
MCH: 31.8 pg (ref 26.6–33.0)
MCHC: 33.7 g/dL (ref 31.5–35.7)
MCV: 94 fL (ref 79–97)
Monocytes Absolute: 0.4 10*3/uL (ref 0.1–0.9)
Monocytes: 6 %
Neutrophils Absolute: 3.1 10*3/uL (ref 1.4–7.0)
Neutrophils: 51 %
Platelets: 266 10*3/uL (ref 150–379)
RBC: 4.49 x10E6/uL (ref 4.14–5.80)
RDW: 14.6 % (ref 12.3–15.4)
WBC: 6 10*3/uL (ref 3.4–10.8)

## 2015-10-30 LAB — CMP14+EGFR
ALT: 20 IU/L (ref 0–44)
AST: 29 IU/L (ref 0–40)
Albumin/Globulin Ratio: 1.3 (ref 1.2–2.2)
Albumin: 4 g/dL (ref 3.6–4.8)
Alkaline Phosphatase: 120 IU/L — ABNORMAL HIGH (ref 39–117)
BUN/Creatinine Ratio: 16 (ref 10–22)
BUN: 12 mg/dL (ref 8–27)
Bilirubin Total: 0.3 mg/dL (ref 0.0–1.2)
CO2: 22 mmol/L (ref 18–29)
Calcium: 9.3 mg/dL (ref 8.6–10.2)
Chloride: 101 mmol/L (ref 96–106)
Creatinine, Ser: 0.76 mg/dL (ref 0.76–1.27)
GFR calc Af Amer: 108 mL/min/{1.73_m2} (ref >59)
GFR calc non Af Amer: 94 mL/min/{1.73_m2} (ref >59)
Globulin, Total: 3.2 g/dL (ref 1.5–4.5)
Glucose: 113 mg/dL — ABNORMAL HIGH (ref 65–99)
Potassium: 4.4 mmol/L (ref 3.5–5.2)
Sodium: 141 mmol/L (ref 134–144)
Total Protein: 7.2 g/dL (ref 6.0–8.5)

## 2015-10-30 LAB — FOLATE: Folate: 3.4 ng/mL (ref >3.0)

## 2015-10-30 LAB — HEPATITIS C ANTIBODY: HCV Ab: <0.1 s/co ratio (ref 0.0–0.9)

## 2015-10-30 LAB — VITAMIN B12: Vitamin B-12: 329 pg/mL (ref 211–946)

## 2015-11-10 ENCOUNTER — Ambulatory Visit (INDEPENDENT_AMBULATORY_CARE_PROVIDER_SITE_OTHER): Payer: Medicare Other | Admitting: Family Medicine

## 2015-11-10 ENCOUNTER — Ambulatory Visit (INDEPENDENT_AMBULATORY_CARE_PROVIDER_SITE_OTHER): Payer: Medicare Other

## 2015-11-10 ENCOUNTER — Encounter (INDEPENDENT_AMBULATORY_CARE_PROVIDER_SITE_OTHER): Payer: Self-pay

## 2015-11-10 ENCOUNTER — Encounter: Payer: Self-pay | Admitting: Family Medicine

## 2015-11-10 VITALS — BP 181/89 | HR 65 | Temp 96.8°F | Ht 67.0 in | Wt 156.0 lb

## 2015-11-10 DIAGNOSIS — M109 Gout, unspecified: Secondary | ICD-10-CM

## 2015-11-10 DIAGNOSIS — M25562 Pain in left knee: Secondary | ICD-10-CM

## 2015-11-10 DIAGNOSIS — M10062 Idiopathic gout, left knee: Secondary | ICD-10-CM

## 2015-11-10 MED ORDER — PREDNISONE 10 MG PO TABS: ORAL_TABLET | ORAL | Status: DC

## 2015-11-10 NOTE — Progress Notes (Addendum)
Subjective:  [name], male    DOB: July 03, 1946  Age: 71 y.o. MRN: [id number]  CC: Knee Pain   HPI MEKKO HEYDE presents for left knee pain. ONset 1 week ago. No injury known.Tried ice without relief used colchicine with minimal relief. Pain is quite severe he is unable to give it a 1-10 number but it is quite severe to the point where he couldn't put weight on the left lower extremity a few days ago. It is somewhat better now but pain is still moderately severe particularly with ambulation. He has to walk with a limp to the left. He confirms that he is using his allopurinol.   History Eriel has a past medical history of Prostate cancer ([place]); Hypertension; CAD (coronary artery disease); Hypercholesterolemia; Tobacco abuse; ETOH abuse; and GERD (gastroesophageal reflux disease).   He has past surgical history that includes Prostate surgery; Cardiac surgery; Colonoscopy; Back surgery; and Esophagogastroduodenoscopy (05/27/2012).   His family history includes Diabetes in his mother. There is no history of Colon cancer.He reports that he has been smoking Cigarettes and E-cigarettes.  He started smoking about 45 years ago. He has a 50 pack-year smoking history. He quit smokeless tobacco use about 3 years ago. His smokeless tobacco use included Snuff. He reports that he drinks alcohol. He reports that he does not use illicit drugs.    ROS Review of Systems  Constitutional: Negative for fever, chills and diaphoresis.  HENT: Negative for rhinorrhea and sore throat.   Respiratory: Negative for cough and shortness of breath.   Cardiovascular: Negative for chest pain.  Gastrointestinal: Negative for abdominal pain.  Musculoskeletal: Negative for myalgias and arthralgias.  Skin: Negative for rash.  Neurological: Negative for weakness and headaches.    Objective:  BP 181/89 mmHg  Pulse 65  Temp(Src) 96.8 F (36 C) (Oral)  Ht 5\' 7"  (1.702 m)  Wt 156 lb (70.761 kg)  BMI  24.43 kg/m2  SpO2 97%  BP Readings from Last 3 Encounters:  11/10/15 181/89  10/29/15 154/81  09/23/15 133/70    Wt Readings from Last 3 Encounters:  11/10/15 156 lb (70.761 kg)  10/29/15 159 lb 9.6 oz (72.394 kg)  09/23/15 155 lb 9.6 oz (70.58 kg)     Physical Exam  Constitutional: He is oriented to person, place, and time. He appears well-developed and well-nourished.  HENT:  Head: Normocephalic and atraumatic.  Right Ear: External ear normal.  Left Ear: External ear normal.  Mouth/Throat: No oropharyngeal exudate or posterior oropharyngeal erythema.  Eyes: Pupils are equal, round, and reactive to light.  Neck: Normal range of motion. Neck supple.  Cardiovascular: Normal rate and regular rhythm.   No murmur heard. Pulmonary/Chest: Breath sounds normal. No respiratory distress.  Musculoskeletal:  The left knee has full range of motion without discomfort actively and passively. Gait is normal without a limp. The joint lines are nontender. The patella is palpable without tenderness or edema.  Lachman / anterior drawer signs are negative for signs of instability and pain free. McMurray testing reveals no pop or excessive discomfort. Varus and valgus stree maneuvers do not cause ligamentous stretch or instability.   Neurological: He is alert and oriented to person, place, and time.  Vitals reviewed.    Lab Results  Component Value Date   WBC 6.0 10/29/2015   HGB 14.4 03/12/2015   HCT 42.4 10/29/2015   PLT 266 10/29/2015   GLUCOSE 113* 10/29/2015   CHOL 101* 03/12/2015   TRIG 81  03/12/2015   HDL 39* 03/12/2015   LDLCALC 46 03/12/2015   ALT 20 10/29/2015   AST 29 10/29/2015   NA 141 10/29/2015   K 4.4 10/29/2015   CL 101 10/29/2015   CREATININE 0.76 10/29/2015   BUN 12 10/29/2015   CO2 22 10/29/2015   TSH 0.514 10/29/2015   INR 1.04 11/23/2009   HGBA1C 5.9* 03/12/2015    No results found.  Assessment & Plan:   Kali was seen today for knee pain.  Diagnoses  and all orders for this visit:  Acute gout of left knee, unspecified cause -     DG Knee 1-2 Views Left; Future -     CBC with Differential/Platelet -     Uric acid  Left knee pain -     DG Knee 1-2 Views Left; Future -     CBC with Differential/Platelet -     Uric acid  Other orders -     predniSONE (DELTASONE) 10 MG tablet; Take 5 daily for 3 days followed by 4,3,2 and 1 for 3 days each.    XR- no apparent abn other than old incidental metallic FBs   I am having Mr. Maish start on predniSONE. I am also having him maintain his aspirin, omeprazole, amLODipine, atorvastatin, metoprolol tartrate, omega-3 acid ethyl esters, allopurinol, colchicine, tamsulosin, and cyclobenzaprine.  Meds ordered this encounter  Medications  . predniSONE (DELTASONE) 10 MG tablet    Sig: Take 5 daily for 3 days followed by 4,3,2 and 1 for 3 days each.    Dispense:  45 tablet    Refill:  0     Follow-up: Return if symptoms worsen or fail to improve.  [name], M.D.

## 2015-11-10 NOTE — Addendum Note (Signed)
Addended by: [name] on: 11/10/2015 01:09 PM   Modules accepted: Orders, SmartSet

## 2015-11-11 LAB — CBC WITH DIFFERENTIAL/PLATELET
Basophils Absolute: 0 10*3/uL (ref 0.0–0.2)
Basos: 0 %
EOS (ABSOLUTE): 0.1 10*3/uL (ref 0.0–0.4)
Eos: 2 %
Hematocrit: 42 % (ref 37.5–51.0)
Hemoglobin: 14.1 g/dL (ref 12.6–17.7)
Immature Grans (Abs): 0 10*3/uL (ref 0.0–0.1)
Immature Granulocytes: 0 %
Lymphocytes Absolute: 2.8 10*3/uL (ref 0.7–3.1)
Lymphs: 40 %
MCH: 32.1 pg (ref 26.6–33.0)
MCHC: 33.6 g/dL (ref 31.5–35.7)
MCV: 96 fL (ref 79–97)
Monocytes Absolute: 0.4 10*3/uL (ref 0.1–0.9)
Monocytes: 5 %
Neutrophils Absolute: 3.7 10*3/uL (ref 1.4–7.0)
Neutrophils: 53 %
Platelets: 288 10*3/uL (ref 150–379)
RBC: 4.39 x10E6/uL (ref 4.14–5.80)
RDW: 14.3 % (ref 12.3–15.4)
WBC: 7 10*3/uL (ref 3.4–10.8)

## 2015-11-11 LAB — URIC ACID: Uric Acid: 5 mg/dL (ref 3.7–8.6)

## 2015-12-18 ENCOUNTER — Other Ambulatory Visit: Payer: Self-pay | Admitting: Pediatrics

## 2015-12-18 ENCOUNTER — Telehealth: Payer: Self-pay | Admitting: Pediatrics

## 2015-12-20 NOTE — Telephone Encounter (Signed)
done

## 2015-12-24 ENCOUNTER — Encounter: Payer: Self-pay | Admitting: Family Medicine

## 2015-12-24 ENCOUNTER — Ambulatory Visit (INDEPENDENT_AMBULATORY_CARE_PROVIDER_SITE_OTHER): Payer: Medicare Other | Admitting: Family Medicine

## 2015-12-24 VITALS — BP 134/75 | HR 75 | Temp 97.3°F | Ht 67.0 in | Wt 152.4 lb

## 2015-12-24 DIAGNOSIS — G629 Polyneuropathy, unspecified: Secondary | ICD-10-CM | POA: Diagnosis not present

## 2015-12-24 NOTE — Progress Notes (Signed)
Subjective:  [name], male    DOB: Dec 28, 1945  Age: 70 y.o. MRN: [id number]  CC: hand numbness    HPI [name] presents for 3-4 mos tingling, numbness. Starting at fingertips bilaterally and equally, but not of the thumbs. Can't pick up a capsule- similar small things. He drops his cigarette at times not even realizing he dropped it. Can hold larger things. Patient had been treated by Dr. Evette Doffing on March 24 for some neck pain that went away with the treatment. However the fingertip numbness noted at the time has actually spread from the fingertips down to the base of the fingers. At this time he denies any numbness in the feet or toes. The numbness does not travel up the arms to the neck and the neck pain has resolved.  History [name] has a past medical history of Prostate cancer ([place]); Hypertension; CAD (coronary artery disease); Hypercholesterolemia; Tobacco abuse; ETOH abuse; and GERD (gastroesophageal reflux disease).   He has past surgical history that includes Prostate surgery; Cardiac surgery; Colonoscopy; Back surgery; and Esophagogastroduodenoscopy (05/27/2012).   His family history includes Diabetes in his mother. There is no history of Colon cancer.He reports that he has been smoking Cigarettes and E-cigarettes.  He started smoking about 45 years ago. He has a 50 pack-year smoking history. He quit smokeless tobacco use about 3 years ago. His smokeless tobacco use included Snuff. He reports that he drinks alcohol. He reports that he does not use illicit drugs.    ROS Review of Systems  Constitutional: Negative for fever, chills and diaphoresis.  HENT: Negative for rhinorrhea and sore throat.   Respiratory: Negative for cough and shortness of breath.   Cardiovascular: Negative for chest pain.  Gastrointestinal: Negative for abdominal pain.  Musculoskeletal: Positive for neck pain. Negative for myalgias and arthralgias.  Skin: Negative for rash.    Neurological: Positive for numbness. Negative for weakness and headaches.    Objective:  BP 134/75 mmHg  Pulse 75  Temp(Src) 97.3 F (36.3 C) (Oral)  Ht 5\' 7"  (1.702 m)  Wt 152 lb 6.4 oz (69.128 kg)  BMI 23.86 kg/m2  SpO2 98%  BP Readings from Last 3 Encounters:  12/24/15 134/75  11/10/15 181/89  10/29/15 154/81    Wt Readings from Last 3 Encounters:  12/24/15 152 lb 6.4 oz (69.128 kg)  11/10/15 156 lb (70.761 kg)  10/29/15 159 lb 9.6 oz (72.394 kg)     Physical Exam  Constitutional: He appears well-developed and well-nourished.  HENT:  Head: Normocephalic and atraumatic.  Right Ear: Tympanic membrane and external ear normal. No decreased hearing is noted.  Left Ear: Tympanic membrane and external ear normal. No decreased hearing is noted.  Mouth/Throat: No oropharyngeal exudate or posterior oropharyngeal erythema.  Eyes: Pupils are equal, round, and reactive to light.  Neck: Normal range of motion. Neck supple.  Cardiovascular: Normal rate and regular rhythm.   No murmur heard. Pulmonary/Chest: Breath sounds normal. No respiratory distress.  Abdominal: Soft. Bowel sounds are normal. He exhibits no mass. There is no tenderness.  Vitals reviewed.    Lab Results  Component Value Date   WBC 7.0 11/10/2015   HGB 14.4 03/12/2015   HCT 42.0 11/10/2015   PLT 288 11/10/2015   GLUCOSE 113* 10/29/2015   CHOL 101* 03/12/2015   TRIG 81 03/12/2015   HDL 39* 03/12/2015   LDLCALC 46 03/12/2015   ALT 20 10/29/2015   AST 29 10/29/2015   NA 141 10/29/2015  K 4.4 10/29/2015   CL 101 10/29/2015   CREATININE 0.76 10/29/2015   BUN 12 10/29/2015   CO2 22 10/29/2015   TSH 0.514 10/29/2015   INR 1.04 11/23/2009   HGBA1C 5.9* 03/12/2015    No results found.  Assessment & Plan:   Miken was seen today for hand numbness.  Diagnoses and all orders for this visit:  Neuropathy ([place]) -     Nerve conduction test; Future   We'll hold off on treatment pending test  results.  I have discontinued [name] predniSONE. I am also having him maintain his aspirin, omeprazole, amLODipine, atorvastatin, metoprolol tartrate, omega-3 acid ethyl esters, colchicine, cyclobenzaprine, allopurinol, and tamsulosin.  No orders of the defined types were placed in this encounter.     Follow-up: No Follow-up on file.  [name], M.D.

## 2016-02-10 ENCOUNTER — Other Ambulatory Visit: Payer: Self-pay

## 2016-02-10 DIAGNOSIS — R202 Paresthesia of skin: Secondary | ICD-10-CM

## 2016-02-14 ENCOUNTER — Other Ambulatory Visit: Payer: Self-pay | Admitting: Cardiology

## 2016-02-16 ENCOUNTER — Telehealth: Payer: Self-pay | Admitting: Pediatrics

## 2016-02-21 ENCOUNTER — Encounter: Payer: Self-pay | Admitting: Pediatrics

## 2016-02-21 ENCOUNTER — Ambulatory Visit (INDEPENDENT_AMBULATORY_CARE_PROVIDER_SITE_OTHER): Payer: Medicare Other | Admitting: Pediatrics

## 2016-02-21 VITALS — BP 117/64 | HR 61 | Temp 97.0°F | Ht 67.0 in | Wt 151.6 lb

## 2016-02-21 DIAGNOSIS — Z0000 Encounter for general adult medical examination without abnormal findings: Secondary | ICD-10-CM

## 2016-02-21 DIAGNOSIS — Z8739 Personal history of other diseases of the musculoskeletal system and connective tissue: Secondary | ICD-10-CM

## 2016-02-21 DIAGNOSIS — G629 Polyneuropathy, unspecified: Secondary | ICD-10-CM | POA: Insufficient documentation

## 2016-02-21 DIAGNOSIS — F17200 Nicotine dependence, unspecified, uncomplicated: Secondary | ICD-10-CM

## 2016-02-21 DIAGNOSIS — I10 Essential (primary) hypertension: Secondary | ICD-10-CM

## 2016-02-21 MED ORDER — ALLOPURINOL 100 MG PO TABS: 100.0000 mg | ORAL_TABLET | Freq: Every day | ORAL | Status: DC

## 2016-02-21 MED ORDER — GABAPENTIN 300 MG PO CAPS: 300.0000 mg | ORAL_CAPSULE | Freq: Three times a day (TID) | ORAL | Status: DC

## 2016-02-21 NOTE — Progress Notes (Addendum)
    Subjective:    [name], male    DOB: 20-Mar-1946, 70 y.o.   MRN: [id number]  CC: Annual Exam   HPI: DANG CORPUS is a 70 y.o. male presenting for Annual Exam  Neuropathy: Whole hand now affected, numb and tingling at times No neck pain No numbness/tingling in feet Still drinking some EtOH, not daily Not drinking much the past week Has nerve conduction study coming up  Tobacco use: the same, not interested in quitting  Gout: no recent flares, taking allopurinol daily  Fall: no falls Cognition: memory has been fine   Depression screen [facility] 2/9 02/21/2016 12/24/2015 11/10/2015 10/29/2015 09/23/2015  Decreased Interest 0 0 0 0 0  Down, Depressed, Hopeless 0 0 0 0 0  PHQ - 2 Score 0 0 0 0 0     Relevant past medical, surgical, family and social history reviewed and updated as indicated.  Interim medical history since our last visit reviewed. Allergies and medications reviewed and updated.  ROS: All systems negative other than whatis in HPI  History  Smoking status  . Current Every Day Smoker -- 1.00 packs/day for 50 years  . Types: Cigarettes, E-cigarettes  . Start date: 03/08/1970  Smokeless tobacco  . Former Systems developer  . Types: Snuff  . Quit date: 03/08/2012       Objective:    BP 117/64 mmHg  Pulse 61  Temp(Src) 97 F (36.1 C) (Oral)  Ht 5\' 7"  (1.702 m)  Wt 151 lb 9.6 oz (68.765 kg)  BMI 23.74 kg/m2  Wt Readings from Last 3 Encounters:  02/21/16 151 lb 9.6 oz (68.765 kg)  12/24/15 152 lb 6.4 oz (69.128 kg)  11/10/15 156 lb (70.761 kg)     Gen: NAD, alert, cooperative with exam, NCAT EYES: EOMI, no scleral injection or icterus ENT:  OP without erythema LYMPH: no cervical LAD CV: NRRR, normal S1/S2, no murmur, distal pulses 2+ b/l Resp: CTABL, no wheezes, normal WOB Abd: +BS, soft, NTND. no guarding or organomegaly Ext: No edema, warm Neuro: Alert and oriented, strength equal hand grip, UE. Tingling with soft touch all fingers equally.  Decreased sensation to monofilament. coordination grossly normal MSK: normal muscle bulk     Assessment & Plan:    [name] was seen today for annual exam.  Diagnoses and all orders for this visit:  Encounter for preventive health examination  Neuropathy ([place]) Labs recently all normal Not drinking daily, cont to encourage cessation Nerve conduction study in 6 weeks -     gabapentin (NEURONTIN) 300 MG capsule; Take 1 capsule (300 mg total) by mouth 3 (three) times daily.  TOBACCO USER Cont to encourage smoking cessation  H/O: gout -     allopurinol (ZYLOPRIM) 100 MG tablet; Take 1 tablet (100 mg total) by mouth daily.  Essential hypertension Well controlled, cont current meds    Follow up plan: Return in about 3 months (around 05/23/2016).  Assunta Found, MD [facility] 02/21/2016, 2:00 PM

## 2016-03-28 ENCOUNTER — Encounter (INDEPENDENT_AMBULATORY_CARE_PROVIDER_SITE_OTHER): Payer: Self-pay | Admitting: Neurology

## 2016-03-28 ENCOUNTER — Ambulatory Visit (INDEPENDENT_AMBULATORY_CARE_PROVIDER_SITE_OTHER): Payer: Medicare Other | Admitting: Neurology

## 2016-03-28 ENCOUNTER — Encounter: Payer: Self-pay | Admitting: Neurology

## 2016-03-28 DIAGNOSIS — R200 Anesthesia of skin: Secondary | ICD-10-CM | POA: Insufficient documentation

## 2016-03-28 DIAGNOSIS — M542 Cervicalgia: Secondary | ICD-10-CM | POA: Diagnosis not present

## 2016-03-28 DIAGNOSIS — Z0289 Encounter for other administrative examinations: Secondary | ICD-10-CM

## 2016-03-28 NOTE — Progress Notes (Signed)
   STUDY DATE: 03/28/16 PATIENT NAME: [name] DOB: 1946/01/26 MRN: [id number]  REFERRING MD:  [name], MD  HISTORY:  [name] is a 70 year old man with numbness and tingling in both hands. He reports that all fingers are involved and both the palm and the dorsum are involved. He denies any weakness. He notes some neck pain.   On examination, sensation were symmetric in the arms.  NERVE CONDUCTION STUDIES:  Bilateral median and ulnar motor responses had normal distal latencies, conduction velocities and amplitudes.   The F wave responses were normal.   Bilateral median and ulnar sensory responses had norma latency and amplitudes  EMG STUDIES:  Needle EMG of the right deltoid, triceps, biceps, pronator teres, extensor digitorum communis, brachioradialis, first dorsal interosseous and abductor pollicis longus muscles were performed.    There were an increased number of large polyphasic motor units with neuropathic recruitment in the biceps, deltoid and brachioradialis muscles.  IMPRESSION:  This NCV/EMG study shows the following: 1.   There is a mild to moderate chronic right C6 radiculopathy without active features. 2.   There was no evidence of a median or ulnar mononeuropathy or polyneuropathy.   Richard A. Felecia Shelling, MD, PhD Certified in Neurology, [place] [facility], Sleep Medicine, Pain Medicine and Neuroimaging  [facility] [address] [place], [place] [address] [phone]

## 2016-03-30 ENCOUNTER — Other Ambulatory Visit: Payer: Self-pay | Admitting: Cardiology

## 2016-03-30 DIAGNOSIS — I10 Essential (primary) hypertension: Secondary | ICD-10-CM

## 2016-04-03 ENCOUNTER — Telehealth: Payer: Self-pay | Admitting: Pediatrics

## 2016-04-03 ENCOUNTER — Ambulatory Visit (INDEPENDENT_AMBULATORY_CARE_PROVIDER_SITE_OTHER): Payer: Medicare Other | Admitting: Family Medicine

## 2016-04-03 ENCOUNTER — Encounter: Payer: Self-pay | Admitting: Family Medicine

## 2016-04-03 VITALS — BP 138/80 | HR 90 | Temp 98.0°F | Ht 67.0 in | Wt 151.8 lb

## 2016-04-03 DIAGNOSIS — J441 Chronic obstructive pulmonary disease with (acute) exacerbation: Secondary | ICD-10-CM

## 2016-04-03 MED ORDER — ALBUTEROL SULFATE HFA 108 (90 BASE) MCG/ACT IN AERS: 2.0000 | INHALATION_SPRAY | Freq: Four times a day (QID) | RESPIRATORY_TRACT | 0 refills | Status: DC | PRN

## 2016-04-03 MED ORDER — AZITHROMYCIN 250 MG PO TABS: ORAL_TABLET | ORAL | 0 refills | Status: DC

## 2016-04-03 MED ORDER — PREDNISONE 20 MG PO TABS: ORAL_TABLET | ORAL | 0 refills | Status: DC

## 2016-04-03 NOTE — Progress Notes (Signed)
BP 138/80   Pulse 90   Temp 98 F (36.7 C) (Oral)   Ht 5\' 7"  (1.702 m)   Wt 151 lb 12.8 oz (68.9 kg)   BMI 23.78 kg/m    Subjective:    [name], male    DOB: 07/03/1946, 70 y.o.   MRN: [id number]  HPI: [name] is a 70 y.o. male presenting on 04/03/2016 for Cough and Congestion in throat   HPI Cough and congestion Patient has been having cough and congestion and wheezing that's been going on for the past 2 days. It is been increasing over the past couple days. He admits that he does smoke and he has smoked since he was in his teens. He has never been told that he has COPD or emphysema. He does admit that he is having wheezing or his wife has noticed that he is having wheezing. He has been coughing up yellow-green sputum. He says he has had shortness of breath sometimes when he has his coughing spells. He denies any chest pain or diaphoresis. He denies any fevers or chills  Relevant past medical, surgical, family and social history reviewed and updated as indicated. Interim medical history since our last visit reviewed. Allergies and medications reviewed and updated.  Review of Systems  Constitutional: Negative for chills and fever.  HENT: Positive for congestion, postnasal drip, rhinorrhea, sinus pressure, sneezing and sore throat. Negative for ear discharge, ear pain and voice change.   Eyes: Negative for pain, discharge, redness and visual disturbance.  Respiratory: Positive for cough, shortness of breath and wheezing. Negative for chest tightness.   Cardiovascular: Negative for chest pain and leg swelling.  Musculoskeletal: Negative for gait problem.  Skin: Negative for rash.  All other systems reviewed and are negative.   Per HPI unless specifically indicated above     Medication List       Accurate as of 04/03/16  1:12 PM. Always use your most recent med list.          albuterol 108 (90 Base) MCG/ACT inhaler Commonly known as:  PROVENTIL  HFA;VENTOLIN HFA Inhale 2 puffs into the lungs every 6 (six) hours as needed for wheezing or shortness of breath.   allopurinol 100 MG tablet Commonly known as:  ZYLOPRIM Take 1 tablet (100 mg total) by mouth daily.   amLODipine 5 MG tablet Commonly known as:  NORVASC TAKE ONE TABLET BY MOUTH ONCE DAILY   aspirin 81 MG tablet Take 81 mg by mouth daily.   atorvastatin 80 MG tablet Commonly known as:  LIPITOR TAKE ONE TABLET BY MOUTH ONCE DAILY   azithromycin 250 MG tablet Commonly known as:  ZITHROMAX Take 2 the first day and then one each day after.   colchicine 0.6 MG tablet Take 1 tablet (0.6 mg total) by mouth daily.   gabapentin 300 MG capsule Commonly known as:  NEURONTIN Take 1 capsule (300 mg total) by mouth 3 (three) times daily.   metoprolol tartrate 25 MG tablet Commonly known as:  LOPRESSOR TAKE ONE-HALF TABLET BY MOUTH TWICE DAILY   omega-3 acid ethyl esters 1 g capsule Commonly known as:  LOVAZA TAKE ONE CAPSULE BY MOUTH TWICE DAILY (NEEDS APPOINTMENT FOR REFILLS)   omeprazole 20 MG capsule Commonly known as:  PRILOSEC Take 1 capsule (20 mg total) by mouth daily.   predniSONE 20 MG tablet Commonly known as:  DELTASONE 2 po at same time daily for 5 days   tamsulosin 0.4 MG  Caps capsule Commonly known as:  FLOMAX TAKE ONE CAPSULE BY MOUTH ONCE DAILY          Objective:    BP 138/80   Pulse 90   Temp 98 F (36.7 C) (Oral)   Ht 5\' 7"  (1.702 m)   Wt 151 lb 12.8 oz (68.9 kg)   BMI 23.78 kg/m   Wt Readings from Last 3 Encounters:  04/03/16 151 lb 12.8 oz (68.9 kg)  02/21/16 151 lb 9.6 oz (68.8 kg)  12/24/15 152 lb 6.4 oz (69.1 kg)    Physical Exam  Constitutional: He is oriented to person, place, and time. He appears well-developed and well-nourished. No distress.  HENT:  Right Ear: Tympanic membrane, external ear and ear canal normal.  Left Ear: Tympanic membrane, external ear and ear canal normal.  Nose: Mucosal edema and rhinorrhea  present. No sinus tenderness. No epistaxis. Right sinus exhibits maxillary sinus tenderness. Right sinus exhibits no frontal sinus tenderness. Left sinus exhibits maxillary sinus tenderness. Left sinus exhibits no frontal sinus tenderness.  Mouth/Throat: Uvula is midline and mucous membranes are normal. Posterior oropharyngeal edema and posterior oropharyngeal erythema present. No oropharyngeal exudate or tonsillar abscesses.  Eyes: Conjunctivae are normal. Right eye exhibits no discharge. No scleral icterus.  Neck: Neck supple. No thyromegaly present.  Cardiovascular: Normal rate, regular rhythm, normal heart sounds and intact distal pulses.   No murmur heard. Pulmonary/Chest: Effort normal. No respiratory distress. He has wheezes (Wheezes throughout all lung fields). He has no rales.  Musculoskeletal: Normal range of motion. He exhibits no edema.  Lymphadenopathy:    He has no cervical adenopathy.  Neurological: He is alert and oriented to person, place, and time. Coordination normal.  Skin: Skin is warm and dry. No rash noted. He is not diaphoretic.  Psychiatric: He has a normal mood and affect. His behavior is normal.  Nursing note and vitals reviewed.     Assessment & Plan:   Problem List Items Addressed This Visit    None    Visit Diagnoses    COPD exacerbation ([place])    -  Primary   Relevant Medications   albuterol (PROVENTIL HFA;VENTOLIN HFA) 108 (90 Base) MCG/ACT inhaler   predniSONE (DELTASONE) 20 MG tablet   azithromycin (ZITHROMAX) 250 MG tablet       Follow up plan: Return in about 4 weeks (around 05/01/2016), or if symptoms worsen or fail to improve, for Recheck breathing.  Counseling provided for all of the vaccine components No orders of the defined types were placed in this encounter.   Caryl Pina, MD [facility] 04/03/2016, 1:12 PM

## 2016-04-17 ENCOUNTER — Encounter: Payer: Self-pay | Admitting: Pediatrics

## 2016-04-17 ENCOUNTER — Ambulatory Visit (INDEPENDENT_AMBULATORY_CARE_PROVIDER_SITE_OTHER): Payer: Medicare Other | Admitting: Pediatrics

## 2016-04-17 VITALS — BP 131/77 | HR 62 | Temp 97.0°F | Ht 67.0 in | Wt 156.4 lb

## 2016-04-17 DIAGNOSIS — J449 Chronic obstructive pulmonary disease, unspecified: Secondary | ICD-10-CM

## 2016-04-17 DIAGNOSIS — F17200 Nicotine dependence, unspecified, uncomplicated: Secondary | ICD-10-CM

## 2016-04-17 DIAGNOSIS — M5410 Radiculopathy, site unspecified: Secondary | ICD-10-CM

## 2016-04-17 DIAGNOSIS — G629 Polyneuropathy, unspecified: Secondary | ICD-10-CM | POA: Diagnosis not present

## 2016-04-17 DIAGNOSIS — I10 Essential (primary) hypertension: Secondary | ICD-10-CM | POA: Diagnosis not present

## 2016-04-17 MED ORDER — GABAPENTIN 300 MG PO CAPS: 600.0000 mg | ORAL_CAPSULE | Freq: Three times a day (TID) | ORAL | 3 refills | Status: DC

## 2016-04-17 MED ORDER — TIOTROPIUM BROMIDE MONOHYDRATE 18 MCG IN CAPS: 18.0000 ug | ORAL_CAPSULE | Freq: Every day | RESPIRATORY_TRACT | 12 refills | Status: DC

## 2016-04-17 NOTE — Progress Notes (Signed)
  Subjective:   [name], male    DOB: January 08, 1946, 70 y.o.   MRN: [id number] CC: Follow-up  HPI: [name] is a 70 y.o. male presenting for Follow-up  Treated two weeks ago for COPD exacerbation with steroids and albuterol Still coughing now Some sore throat, he thinks because of the cough Still coughing lots of phlegm up--brown/gray Still feels short of breath Smoking now Not interested in quitting smoking Breathing is improved overall  Still with tingling in his R hand Had nerve conduction study, showed C6 radiculopathy  HTN: taking meds regularly No HA, no CP   Relevant past medical, surgical, family and social history reviewed. Allergies and medications reviewed and updated. History  Smoking Status  . Current Every Day Smoker  . Packs/day: 0.50  . Years: 50.00  . Types: Cigarettes, E-cigarettes  . Start date: 03/08/1970  Smokeless Tobacco  . Former Systems developer  . Types: Snuff  . Quit date: 03/08/2012   ROS: Per HPI   Objective:    BP 131/77   Pulse 62   Temp 97 F (36.1 C)   Ht 5\' 7"  (1.702 m)   Wt 156 lb 6.4 oz (70.9 kg)   SpO2 99%   BMI 24.50 kg/m   Wt Readings from Last 3 Encounters:  04/17/16 156 lb 6.4 oz (70.9 kg)  04/03/16 151 lb 12.8 oz (68.9 kg)  02/21/16 151 lb 9.6 oz (68.8 kg)    Gen: NAD, alert, cooperative with exam, NCAT EYES: EOMI, no conjunctival injection, or no icterus ENT:  TMs pearly gray b/l, OP without erythema LYMPH: no cervical LAD CV: NRRR, normal S1/S2, no murmur, distal pulses 2+ b/l Resp: slight wheeze present with expiration, normal WOB Ext: No edema, warm Neuro: Alert and oriented  Assessment & Plan:  Arhum was seen today for follow-up multiple med problems.  Diagnoses and all orders for this visit:  Essential hypertension Well controlled, cont current meds  TOBACCO USER Not interested in cessation, will cont to encourage  Chronic obstructive pulmonary disease, unspecified COPD type ([place]) Start  spiriva Cont albuterol prn -     PR BREATHING CAPACITY TEST -     tiotropium (SPIRIVA) 18 MCG inhalation capsule; Place 1 capsule (18 mcg total) into inhaler and inhale daily.  Radiculopathy affecting upper extremity C6 radiculopathy of R arm will get MRI of cervical spine  -     gabapentin (NEURONTIN) 300 MG capsule; Take 2 capsules (600 mg total) by mouth 3 (three) times daily.   Follow up plan: 50mo Assunta Found, MD [place]

## 2016-04-24 ENCOUNTER — Ambulatory Visit (HOSPITAL_COMMUNITY)
Admission: RE | Admit: 2016-04-24 | Discharge: 2016-04-24 | Disposition: A | Discharge status: Home / Self Care | Payer: Medicare Other | Source: Ambulatory Visit | Attending: Pediatrics | Admitting: Pediatrics

## 2016-04-24 DIAGNOSIS — M47892 Other spondylosis, cervical region: Secondary | ICD-10-CM | POA: Diagnosis not present

## 2016-04-24 DIAGNOSIS — G9589 Other specified diseases of spinal cord: Secondary | ICD-10-CM | POA: Diagnosis not present

## 2016-04-24 DIAGNOSIS — M1288 Other specific arthropathies, not elsewhere classified, other specified site: Secondary | ICD-10-CM | POA: Insufficient documentation

## 2016-04-24 DIAGNOSIS — M778 Other enthesopathies, not elsewhere classified: Secondary | ICD-10-CM | POA: Insufficient documentation

## 2016-04-24 DIAGNOSIS — M5410 Radiculopathy, site unspecified: Secondary | ICD-10-CM | POA: Insufficient documentation

## 2016-04-24 DIAGNOSIS — M4802 Spinal stenosis, cervical region: Secondary | ICD-10-CM | POA: Insufficient documentation

## 2016-04-24 DIAGNOSIS — M2578 Osteophyte, vertebrae: Secondary | ICD-10-CM | POA: Diagnosis not present

## 2016-05-05 ENCOUNTER — Other Ambulatory Visit: Payer: Self-pay | Admitting: Family Medicine

## 2016-05-08 ENCOUNTER — Encounter: Payer: Self-pay | Admitting: Cardiology

## 2016-05-08 ENCOUNTER — Ambulatory Visit (INDEPENDENT_AMBULATORY_CARE_PROVIDER_SITE_OTHER): Payer: Medicare Other | Admitting: Cardiology

## 2016-05-08 VITALS — BP 113/68 | HR 66 | Ht 66.0 in | Wt 161.0 lb

## 2016-05-08 DIAGNOSIS — I10 Essential (primary) hypertension: Secondary | ICD-10-CM

## 2016-05-08 DIAGNOSIS — E782 Mixed hyperlipidemia: Secondary | ICD-10-CM

## 2016-05-08 DIAGNOSIS — Z23 Encounter for immunization: Secondary | ICD-10-CM

## 2016-05-08 DIAGNOSIS — I2510 Atherosclerotic heart disease of native coronary artery without angina pectoris: Secondary | ICD-10-CM

## 2016-05-08 DIAGNOSIS — Z136 Encounter for screening for cardiovascular disorders: Secondary | ICD-10-CM | POA: Diagnosis not present

## 2016-05-08 MED ORDER — METOPROLOL TARTRATE 25 MG PO TABS: 12.5000 mg | ORAL_TABLET | Freq: Two times a day (BID) | ORAL | 3 refills | Status: DC

## 2016-05-08 MED ORDER — OMEGA-3-ACID ETHYL ESTERS 1 G PO CAPS: ORAL_CAPSULE | ORAL | 3 refills | Status: DC

## 2016-05-08 MED ORDER — AMLODIPINE BESYLATE 5 MG PO TABS: 5.0000 mg | ORAL_TABLET | Freq: Every day | ORAL | 6 refills | Status: DC

## 2016-05-08 MED ORDER — ATORVASTATIN CALCIUM 80 MG PO TABS: 80.0000 mg | ORAL_TABLET | Freq: Every day | ORAL | 6 refills | Status: DC

## 2016-05-08 NOTE — Progress Notes (Addendum)
Clinical Summary [name] is a 70 y.o.male  seen today for follow up of the following medical problems.   1. CAD  - prior MI in 1997, prior CABG approx 13 years ago at [facility]  - cath 2011 showed patent grafts. 04/2013 echo shows LVEF 60%  - myoview 12/2012 shows old inferolateral scar, no ischemia    - no chest pain. Can have some SOB at times. No recent LE edema.  - compliant with meds  2. HTN  - does not check at home  - compliant w/ meds  - lisionpril stopped by pcp per patient report - home bp's 110s/70s  3. HL  - compliant with statin   4 COPD - followed by pcp   5. AAA screen - male over 56 with prior tobacco history, has not had AAA screening.    SH: recent trip [place].  Past Medical History:  Diagnosis Date  . CAD (coronary artery disease)    a. s/p INF MI 1997;  b. s/p CABG 2001;  c. [place] 11/2009:  3v CAD, S-PDA ok with 40% mid, S-OM ok, S-Dx ok, L-LAD ok, EF 50%;  d.  [place] 5/14:  Inferolateral scar, EF 46%, no ischemia  . ETOH abuse   . GERD (gastroesophageal reflux disease)   . Hypercholesterolemia   . Hypertension   . Prostate cancer ([place])   . Tobacco abuse      No Known Allergies   Current Outpatient Prescriptions  Medication Sig Dispense Refill  . albuterol (PROVENTIL HFA;VENTOLIN HFA) 108 (90 Base) MCG/ACT inhaler Inhale 2 puffs into the lungs every 6 (six) hours as needed for wheezing or shortness of breath. 1 Inhaler 0  . allopurinol (ZYLOPRIM) 100 MG tablet Take 1 tablet (100 mg total) by mouth daily. 30 tablet 3  . amLODipine (NORVASC) 5 MG tablet TAKE ONE TABLET BY MOUTH ONCE DAILY 90 tablet 6  . aspirin 81 MG tablet Take 81 mg by mouth daily.    Marland Kitchen atorvastatin (LIPITOR) 80 MG tablet TAKE ONE TABLET BY MOUTH ONCE DAILY 90 tablet 6  . colchicine 0.6 MG tablet Take 1 tablet (0.6 mg total) by mouth daily. 14 tablet 3  . gabapentin (NEURONTIN) 300 MG capsule Take 2 capsules (600 mg total) by mouth 3 (three) times  daily. 180 capsule 3  . metoprolol tartrate (LOPRESSOR) 25 MG tablet TAKE ONE-HALF TABLET BY MOUTH TWICE DAILY 90 tablet 3  . omega-3 acid ethyl esters (LOVAZA) 1 g capsule TAKE ONE CAPSULE BY MOUTH TWICE DAILY (NEEDS APPOINTMENT FOR REFILLS) 90 capsule 1  . omeprazole (PRILOSEC) 20 MG capsule Take 1 capsule (20 mg total) by mouth daily. 30 capsule 3  . tamsulosin (FLOMAX) 0.4 MG CAPS capsule TAKE ONE CAPSULE BY MOUTH ONCE DAILY 30 capsule 5  . tiotropium (SPIRIVA) 18 MCG inhalation capsule Place 1 capsule (18 mcg total) into inhaler and inhale daily. 30 capsule 12   No current facility-administered medications for this visit.      Past Surgical History:  Procedure Laterality Date  . BACK SURGERY    . CARDIAC SURGERY    . COLONOSCOPY     in remote past, Dr. Sharlett Iles. Obtaining records.   . ESOPHAGOGASTRODUODENOSCOPY  05/27/2012   [id number] esophagus, stomach and duodenum s/p dilator  . PROSTATE SURGERY       No Known Allergies    Family History  Problem Relation Age of Onset  . Diabetes Mother   . Colon cancer Neg Hx  Social History [name] reports that he has been smoking Cigarettes and E-cigarettes.  He started smoking about 46 years ago. He has a 25.00 pack-year smoking history. He quit smokeless tobacco use about 4 years ago. His smokeless tobacco use included Snuff. [name] reports that he drinks alcohol.   Review of Systems CONSTITUTIONAL: No weight loss, fever, chills, weakness or fatigue.  HEENT: Eyes: No visual loss, blurred vision, double vision or yellow sclerae.No hearing loss, sneezing, congestion, runny nose or sore throat.  SKIN: No rash or itching.  CARDIOVASCULAR: per HPI RESPIRATORY: No shortness of breath, cough or sputum.  GASTROINTESTINAL: No anorexia, nausea, vomiting or diarrhea. No abdominal pain or blood.  GENITOURINARY: No burning on urination, no polyuria NEUROLOGICAL: No headache, dizziness, syncope, paralysis, ataxia, numbness or  tingling in the extremities. No change in bowel or bladder control.  MUSCULOSKELETAL: No muscle, back pain, joint pain or stiffness.  LYMPHATICS: No enlarged nodes. No history of splenectomy.  PSYCHIATRIC: No history of depression or anxiety.  ENDOCRINOLOGIC: No reports of sweating, cold or heat intolerance. No polyuria or polydipsia.  Marland Kitchen   Physical Examination Vitals:   05/08/16 1602  BP: 113/68  Pulse: 66   Vitals:   05/08/16 1602  Weight: 161 lb (73 kg)  Height: 5\' 6"  (1.676 m)    Gen: resting comfortably, no acute distress HEENT: no scleral icterus, pupils equal round and reactive, no palptable cervical adenopathy,  CV: RRR, no m/r/g, no jvd Resp: Clear to auscultation bilaterally GI: abdomen is soft, non-tender, non-distended, normal bowel sounds, no hepatosplenomegaly MSK: extremities are warm, no edema.  Skin: warm, no rash Neuro:  no focal deficits Psych: appropriate affect   Diagnostic Studies  11/25/2009:  Cardiac Cath Findings: Left ventricular angiogram was performed in the RAO projection and  showed normal left ventricular systolic function with ejection  fraction estimated at 50%. Mild mitral regurgitation was noted.  10.Aortic root angiogram was performed and did not show enlargement of  the aortic root.  IMPRESSION:  1. Severe triple-vessel coronary artery disease.  2. Patent bypass grafts 4/4.  3. Low normal left ventricular systolic function.   12/2012 Myoview  Inferolateral scar, no active ischemia, LVEF 46%.   05/01/13 Echo: LVEF 55-60%, grade II diastolic dysfunction, multiple WMAs, mild MR,   Pertinent labs  05/29/13: K 5.4 Na 141 AST 26 ALT 16 Cr 0.90 TC 136 TG 66 HDL 60 LDL 63  Assessment and Plan  1.CAD  - no current symptoms - we will continue current meds - ekg in clinic SR without ischemic changes  2. HTN  - he is at goal, we will continue current meds  3. HL  - we will continue high dose statin in setting of  known cardiovascular disease.   4. AAA screen - male over 73 with tobacco history, will order AAA screen.     F/u 1 year.       Arnoldo Lenis, M.D

## 2016-05-08 NOTE — Patient Instructions (Signed)
Your physician wants you to follow-up in: 1 YEAR WITH DR. BRANCH You will receive a reminder letter in the mail two months in advance. If you don't receive a letter, please call our office to schedule the follow-up appointment.  Your physician recommends that you continue on your current medications as directed. Please refer to the Current Medication list given to you today.  Your physician has requested that you have an abdominal aorta duplex. During this test, an ultrasound is used to evaluate the aorta. Allow 30 minutes for this exam. Do not eat after midnight the day before and avoid carbonated beverages  Thank you for choosing [facility]!!   

## 2016-05-22 ENCOUNTER — Telehealth: Payer: Self-pay | Admitting: Pediatrics

## 2016-05-22 DIAGNOSIS — M4802 Spinal stenosis, cervical region: Secondary | ICD-10-CM

## 2016-05-22 NOTE — Telephone Encounter (Signed)
Spoke with pt and his wife, referral to neurosurgery placed for cervical stenosis found on MRI causing numbness, tingling in hands.

## 2016-05-24 ENCOUNTER — Ambulatory Visit: Payer: Medicare Other | Admitting: Pediatrics

## 2016-05-29 ENCOUNTER — Telehealth: Payer: Self-pay | Admitting: Pediatrics

## 2016-06-06 DIAGNOSIS — M4712 Other spondylosis with myelopathy, cervical region: Secondary | ICD-10-CM | POA: Diagnosis not present

## 2016-06-06 DIAGNOSIS — M4722 Other spondylosis with radiculopathy, cervical region: Secondary | ICD-10-CM | POA: Diagnosis not present

## 2016-06-21 ENCOUNTER — Ambulatory Visit: Payer: Medicare Other

## 2016-06-21 ENCOUNTER — Other Ambulatory Visit: Payer: Self-pay | Admitting: Neurosurgery

## 2016-06-21 DIAGNOSIS — Z136 Encounter for screening for cardiovascular disorders: Secondary | ICD-10-CM | POA: Diagnosis not present

## 2016-06-21 DIAGNOSIS — I700 Atherosclerosis of aorta: Secondary | ICD-10-CM | POA: Diagnosis not present

## 2016-06-28 ENCOUNTER — Other Ambulatory Visit: Payer: Self-pay | Admitting: Pediatrics

## 2016-06-28 DIAGNOSIS — Z8739 Personal history of other diseases of the musculoskeletal system and connective tissue: Secondary | ICD-10-CM

## 2016-07-12 NOTE — Pre-Procedure Instructions (Signed)
[name]  07/12/2016      [facility] [address], [address] [address] [place] [address] Phone: [phone] Fax: [phone]    Your procedure is scheduled on December 15  Report to [place] at 0600 A.M.  Call this number if you have problems the morning of surgery:  [phone]   Remember:  Do not eat food or drink liquids after midnight.   Take these medicines the morning of surgery with A SIP OF WATER acetaminophen (TYLENOL), albuterol, allopurinol (ZYLOPRIM), amLODipine (NORVASC), colchicine, gabapentin (NEURONTIN), metoprolol tartrate (LOPRESSOR), omeprazole (PRILOSEC), tamsulosin (FLOMAX), tiotropium (SPIRIVA)  7 days prior to surgery STOP taking any Aspirin, Aleve, Naproxen, Ibuprofen, Motrin, Advil, Goody's, BC's, all herbal medications, fish oil, and all vitamins    Do not wear jewelry.  Do not wear lotions, powders, or cologne, or deoderant.  Men may shave face and neck.  Do not bring valuables to the hospital.  [facility] is not responsible for any belongings or valuables.  Contacts, dentures or bridgework may not be worn into surgery.  Leave your suitcase in the car.  After surgery it may be brought to your room.  For patients admitted to the hospital, discharge time will be determined by your treatment team.  Patients discharged the day of surgery will not be allowed to drive home.    Special instructions:   [facility]- Preparing For Surgery  Before surgery, you can play an important role. Because skin is not sterile, your skin needs to be as free of germs as possible. You can reduce the number of germs on your skin by washing with CHG (chlorahexidine gluconate) Soap before surgery.  CHG is an antiseptic cleaner which kills germs and bonds with the skin to continue killing germs even after washing.  Please do not use if you have an allergy to CHG or antibacterial soaps. If your skin becomes  reddened/irritated stop using the CHG.  Do not shave (including legs and underarms) for at least 48 hours prior to first CHG shower. It is OK to shave your face.  Please follow these instructions carefully.   1. Shower the NIGHT BEFORE SURGERY and the MORNING OF SURGERY with CHG.   2. If you chose to wash your hair, wash your hair first as usual with your normal shampoo.  3. After you shampoo, rinse your hair and body thoroughly to remove the shampoo.  4. Use CHG as you would any other liquid soap. You can apply CHG directly to the skin and wash gently with a scrungie or a clean washcloth.   5. Apply the CHG Soap to your body ONLY FROM THE NECK DOWN.  Do not use on open wounds or open sores. Avoid contact with your eyes, ears, mouth and genitals (private parts). Wash genitals (private parts) with your normal soap.  6. Wash thoroughly, paying special attention to the area where your surgery will be performed.  7. Thoroughly rinse your body with warm water from the neck down.  8. DO NOT shower/wash with your normal soap after using and rinsing off the CHG Soap.  9. Pat yourself dry with a CLEAN TOWEL.   10. Wear CLEAN PAJAMAS   11. Place CLEAN SHEETS on your bed the night of your first shower and DO NOT SLEEP WITH PETS.    Day of Surgery: Do not apply any deodorants/lotions. Please wear clean clothes to the hospital/surgery center.  Please read over the following fact sheets that you were given.

## 2016-07-13 ENCOUNTER — Encounter (HOSPITAL_COMMUNITY): Payer: Self-pay

## 2016-07-13 ENCOUNTER — Encounter (HOSPITAL_COMMUNITY)
Admission: RE | Admit: 2016-07-13 | Discharge: 2016-07-13 | Disposition: A | Discharge status: Home / Self Care | Payer: Medicare Other | Source: Ambulatory Visit | Attending: Neurosurgery | Admitting: Neurosurgery

## 2016-07-13 DIAGNOSIS — M4722 Other spondylosis with radiculopathy, cervical region: Secondary | ICD-10-CM | POA: Diagnosis not present

## 2016-07-13 DIAGNOSIS — Z01812 Encounter for preprocedural laboratory examination: Secondary | ICD-10-CM | POA: Insufficient documentation

## 2016-07-13 DIAGNOSIS — M4712 Other spondylosis with myelopathy, cervical region: Secondary | ICD-10-CM | POA: Diagnosis not present

## 2016-07-13 HISTORY — DX: Dyspnea, unspecified: R06.00

## 2016-07-13 HISTORY — DX: Chronic obstructive pulmonary disease, unspecified: J44.9

## 2016-07-13 HISTORY — DX: Angina pectoris, unspecified: I20.9

## 2016-07-13 LAB — BASIC METABOLIC PANEL
Anion gap: 7 (ref 5–15)
BUN: 9 mg/dL (ref 6–20)
CO2: 29 mmol/L (ref 22–32)
Calcium: 9.1 mg/dL (ref 8.9–10.3)
Chloride: 102 mmol/L (ref 101–111)
Creatinine, Ser: 1.01 mg/dL (ref 0.61–1.24)
GFR calc Af Amer: >60 mL/min (ref >60)
GFR calc non Af Amer: >60 mL/min (ref >60)
Glucose, Bld: 102 mg/dL — ABNORMAL HIGH (ref 65–99)
Potassium: 4.3 mmol/L (ref 3.5–5.1)
Sodium: 138 mmol/L (ref 135–145)

## 2016-07-13 LAB — CBC
HCT: 41.2 % (ref 39.0–52.0)
Hemoglobin: 14 g/dL (ref 13.0–17.0)
MCH: 32 pg (ref 26.0–34.0)
MCHC: 34 g/dL (ref 30.0–36.0)
MCV: 94.3 fL (ref 78.0–100.0)
Platelets: 193 10*3/uL (ref 150–400)
RBC: 4.37 MIL/uL (ref 4.22–5.81)
RDW: 13.7 % (ref 11.5–15.5)
WBC: 6.1 10*3/uL (ref 4.0–10.5)

## 2016-07-13 LAB — SURGICAL PCR SCREEN
MRSA, PCR: NEGATIVE
Staphylococcus aureus: POSITIVE — AB

## 2016-07-13 LAB — TYPE AND SCREEN
ABO/RH(D): O POS
Antibody Screen: NEGATIVE

## 2016-07-13 LAB — ABO/RH: ABO/RH(D): O POS

## 2016-07-13 NOTE — Progress Notes (Signed)
Prescription called in at the [facility] [phone], left message with permission on patients answering machine about prescription

## 2016-07-13 NOTE — Progress Notes (Signed)
PCP - Assunta Found Cardiologist - [name] - unaware of surgery   Chest x-ray - not needed EKG - 05/08/16 Stress Test - 12/18/12 ECHO - 05/01/13 Cardiac Cath - 11/24/09  Patient had open heart surgery years ago but could not remember the exact year.  Thought that it was late 90s early 2000s  I looked in the Media but was unable to locate it.  The heart cath documents are in media.    Sending to anesthesia for review of EKG and cardiac records.    Patient denies shortness of breath, fever, cough and chest pain at PAT appointment

## 2016-07-14 NOTE — Progress Notes (Signed)
Anesthesia Chart Review:  Pt is a 70 year old male scheduled for 4 level ACDF on 07/21/2016 with Ashok Pall, MD.   - Cardiologist is [name], MD, last office visit 05/08/16, 1 year follow up recommended.  - PCP is Assunta Found, MD who referred pt to neurosurgery.   PMH includes:  CAD (s/p CABG 2001), HTN, hyperilipidemia, COPD, hx alcohol abuse, prostate cancer, GERD. Current smoker. BMI 27  Medications include: albuterol, amlodipine, ASA, lipitor, metoprolol, prilosec, spiriva  Preoperative labs reviewed.   EKG 05/08/16: Sinus  Rhythm. Diffuse low voltage. Inferior infarct -probably not recent. Old anterior infarct.  AAA screen 06/21/16:  - Normal caliber abdominal aorta, common and external iliac arteries, without focal dilatation. - Aorto-iliac atherosclerosis without focal stenosis. - The IVC is patent.  Echo 05/01/13:  - Left ventricle: The cavity size was mildly dilated. Wall thickness was normal. Systolic function was normal. The estimated ejection fraction was in the range of 55% to 60%. Features are consistent with a pseudonormal left ventricular filling pattern, with concomitant abnormal relaxation and increased filling pressure (grade 2 diastolic dysfunction). Doppler parameters are consistent with high ventricular filling pressure. - Regional wall motion abnormality: Severe hypokinesis of the basal inferoseptal, basal-mid inferior, and basal-mid inferolateral myocardium; mild hypokinesis of the mid inferoseptal and apical inferior myocardium. - Mitral valve: Mildly thickened leaflets . Mild anterior leaflet prolapse. Mild regurgitation. - Left atrium: The atrium was mildly dilated. - Pulmonary arteries: Inadequate envelope to accurately assess pulmonary artery systolic pressures. - Pericardium, extracardiac: A small pericardial effusion was identified, with no hemodynamic sequelae.  Nuclear stress test 12/18/12: Abnormal combined stress and pharmacologic nuclear  myocardial study indicating inferolateral infarction, mildly impaired overall left ventricular systolic function and neither electrocardiographic nor scintigraphic evidence for myocardial ischemia. EF 46%  Cardiac cath 11/24/09:  1. Severe triple-vessel CAD. 2. Patent bypass grafts 4/4. 3. Low normal LV systolic function. EF 50%.  If no changes, I anticipate pt can proceed with surgery as scheduled.   Willeen Cass, FNP-BC [facility]/Anesthesiology Phone: [phone] 07/14/2016 2:09 PM

## 2016-07-17 ENCOUNTER — Ambulatory Visit: Payer: Medicare Other | Admitting: Pediatrics

## 2016-07-19 ENCOUNTER — Encounter: Payer: Self-pay | Admitting: Pediatrics

## 2016-07-19 ENCOUNTER — Ambulatory Visit (INDEPENDENT_AMBULATORY_CARE_PROVIDER_SITE_OTHER): Payer: Medicare Other

## 2016-07-19 ENCOUNTER — Ambulatory Visit (INDEPENDENT_AMBULATORY_CARE_PROVIDER_SITE_OTHER): Payer: Medicare Other | Admitting: Pediatrics

## 2016-07-19 VITALS — BP 131/74 | HR 46 | Temp 97.0°F | Ht 66.0 in | Wt 165.2 lb

## 2016-07-19 DIAGNOSIS — N400 Enlarged prostate without lower urinary tract symptoms: Secondary | ICD-10-CM

## 2016-07-19 DIAGNOSIS — M79672 Pain in left foot: Secondary | ICD-10-CM

## 2016-07-19 DIAGNOSIS — G629 Polyneuropathy, unspecified: Secondary | ICD-10-CM

## 2016-07-19 MED ORDER — TAMSULOSIN HCL 0.4 MG PO CAPS: 0.4000 mg | ORAL_CAPSULE | Freq: Every day | ORAL | 1 refills | Status: DC

## 2016-07-19 MED ORDER — GABAPENTIN 300 MG PO CAPS: 600.0000 mg | ORAL_CAPSULE | Freq: Three times a day (TID) | ORAL | 3 refills | Status: DC

## 2016-07-19 NOTE — Progress Notes (Signed)
  Subjective:   [name], male    DOB: Nov 04, 1945, 70 y.o.   MRN: [id number] CC: Follow-up (3 month)  HPI: [name] is a 70 y.o. male presenting for Follow-up (3 month)  Has surgery scheduled for later this week on neck for cervical stenosis Numbness no worse, continues to drop things at times with L hand Has tingling in fingers Gabapentin helps some with tingling in fingers  Follows with urology for BPH tamsulosin does help with stream  HTN: no headaches, no chest pain, no vision changes  L foot with pain at base of 5th metatarsal when he is very active, such as powerwashing a house Pain improves when he rests  No ankle pain No h/o injury to foot  Relevant past medical, surgical, family and social history reviewed. Allergies and medications reviewed and updated. History  Smoking Status  . Current Every Day Smoker  . Packs/day: 0.25  . Years: 50.00  . Types: Cigarettes, E-cigarettes  . Start date: 03/08/1970  Smokeless Tobacco  . Current User  . Types: Snuff, Chew  . Last attempt to quit: 03/08/2012   ROS: Per HPI   Objective:    BP 131/74   Pulse (!) 46   Temp 97 F (36.1 C) (Oral)   Ht 5\' 6"  (1.676 m)   Wt 165 lb 3.2 oz (74.9 kg)   BMI 26.66 kg/m   Wt Readings from Last 3 Encounters:  07/19/16 165 lb 3.2 oz (74.9 kg)  07/13/16 166 lb 14.2 oz (75.7 kg)  05/08/16 161 lb (73 kg)    Gen: NAD, alert, cooperative with exam, NCAT EYES: EOMI, no conjunctival injection, or no icterus CV: NRRR, normal S1/S2, no murmur Resp: CTABL, no wheezes, normal WOB Ext: No edema, warm Neuro: Alert and oriented, hand grip 5/5 b/l MSK: L foot with nodule at base of 5th metatarsal, no tenderness to palpation, no redness or swelling  Assessment & Plan:  [name] was seen today for follow-up multiple med problems.  Diagnoses and all orders for this visit:  Benign prostatic hyperplasia without lower urinary tract symptoms Stable, follows with urology, cont  below -     tamsulosin (FLOMAX) 0.4 MG CAPS capsule; Take 1 capsule (0.4 mg total) by mouth daily.  Neuropathy (HCC) Ongoing symptoms, gabapentin helps some, has surgery upcoming for cervical stenosis later this week -     gabapentin (NEURONTIN) 300 MG capsule; Take 2 capsules (600 mg total) by mouth 3 (three) times daily.  Left foot pain No fracture on xray Rest, ice prn If worsens let me know -     DG Foot Complete Left; Future   Follow up plan: 6 mo Assunta Found, MD [place]

## 2016-07-20 NOTE — Anesthesia Preprocedure Evaluation (Addendum)
Anesthesia Evaluation  Patient identified by MRN, date of birth, ID band Patient awake    Reviewed: Allergy & Precautions, NPO status , Patient's Chart, lab work & pertinent test results  History of Anesthesia Complications Negative for: history of anesthetic complications  Airway Mallampati: I  TM Distance: >3 FB Neck ROM: Limited    Dental  (+) Dental Advisory Given, Poor Dentition, Missing   Pulmonary COPD,  COPD inhaler, Current Smoker,    breath sounds clear to auscultation       Cardiovascular hypertension, Pt. on medications and Pt. on home beta blockers (-) angina+ CAD and + CABG   Rhythm:Regular Rate:Normal  '14 ECHO:  EF 55-60%, Regional wall motion abnormality: Severe hypokinesis of the basal inferoseptal, basal-mid inferior, and basal-mid inferolateral myocardium; mild hypokinesis of the midinferoseptal and apical inferior myocardium, mild MR   Neuro/Psych negative neurological ROS     GI/Hepatic GERD  Medicated and Controlled,(+)     substance abuse  alcohol use,   Endo/Other  negative endocrine ROS  Renal/GU negative Renal ROS     Musculoskeletal   Abdominal   Peds  Hematology negative hematology ROS (+)   Anesthesia Other Findings   Reproductive/Obstetrics                            Anesthesia Physical Anesthesia Plan  ASA: III  Anesthesia Plan: General   Post-op Pain Management:    Induction: Intravenous  Airway Management Planned: Oral ETT and Video Laryngoscope Planned  Additional Equipment:   Intra-op Plan:   Post-operative Plan: Extubation in OR  Informed Consent: I have reviewed the patients History and Physical, chart, labs and discussed the procedure including the risks, benefits and alternatives for the proposed anesthesia with the patient or authorized representative who has indicated his/her understanding and acceptance.   Dental advisory  given  Plan Discussed with: CRNA and Surgeon  Anesthesia Plan Comments: (Plan routine monitors, GETA with VideoGlide intubation)        Anesthesia Quick Evaluation

## 2016-07-21 ENCOUNTER — Inpatient Hospital Stay (HOSPITAL_COMMUNITY)
Admission: RE | Admit: 2016-07-21 | Discharge: 2016-07-23 | DRG: 473 | Disposition: A | Discharge status: Home / Self Care | Payer: Medicare Other | Source: Ambulatory Visit | Attending: Neurosurgery | Admitting: Neurosurgery

## 2016-07-21 ENCOUNTER — Inpatient Hospital Stay (HOSPITAL_COMMUNITY): Payer: Medicare Other

## 2016-07-21 ENCOUNTER — Inpatient Hospital Stay (HOSPITAL_COMMUNITY): Payer: Medicare Other | Admitting: Emergency Medicine

## 2016-07-21 ENCOUNTER — Encounter (HOSPITAL_COMMUNITY)
Admission: RE | Disposition: A | Discharge status: Home / Self Care | Payer: Self-pay | Source: Ambulatory Visit | Attending: Neurosurgery

## 2016-07-21 ENCOUNTER — Encounter (HOSPITAL_COMMUNITY): Payer: Self-pay | Admitting: Certified Registered Nurse Anesthetist

## 2016-07-21 ENCOUNTER — Inpatient Hospital Stay (HOSPITAL_COMMUNITY): Payer: Medicare Other | Admitting: Anesthesiology

## 2016-07-21 DIAGNOSIS — I252 Old myocardial infarction: Secondary | ICD-10-CM | POA: Diagnosis not present

## 2016-07-21 DIAGNOSIS — J449 Chronic obstructive pulmonary disease, unspecified: Secondary | ICD-10-CM | POA: Diagnosis present

## 2016-07-21 DIAGNOSIS — M4722 Other spondylosis with radiculopathy, cervical region: Secondary | ICD-10-CM | POA: Diagnosis not present

## 2016-07-21 DIAGNOSIS — F17210 Nicotine dependence, cigarettes, uncomplicated: Secondary | ICD-10-CM | POA: Diagnosis present

## 2016-07-21 DIAGNOSIS — I2510 Atherosclerotic heart disease of native coronary artery without angina pectoris: Secondary | ICD-10-CM | POA: Diagnosis present

## 2016-07-21 DIAGNOSIS — M542 Cervicalgia: Secondary | ICD-10-CM | POA: Diagnosis present

## 2016-07-21 DIAGNOSIS — I10 Essential (primary) hypertension: Secondary | ICD-10-CM | POA: Diagnosis present

## 2016-07-21 DIAGNOSIS — J9600 Acute respiratory failure, unspecified whether with hypoxia or hypercapnia: Secondary | ICD-10-CM | POA: Diagnosis not present

## 2016-07-21 DIAGNOSIS — M4712 Other spondylosis with myelopathy, cervical region: Principal | ICD-10-CM | POA: Diagnosis present

## 2016-07-21 DIAGNOSIS — Z951 Presence of aortocoronary bypass graft: Secondary | ICD-10-CM

## 2016-07-21 DIAGNOSIS — M48061 Spinal stenosis, lumbar region without neurogenic claudication: Secondary | ICD-10-CM | POA: Diagnosis present

## 2016-07-21 DIAGNOSIS — J9811 Atelectasis: Secondary | ICD-10-CM | POA: Diagnosis not present

## 2016-07-21 DIAGNOSIS — Z978 Presence of other specified devices: Secondary | ICD-10-CM

## 2016-07-21 DIAGNOSIS — Z419 Encounter for procedure for purposes other than remedying health state, unspecified: Secondary | ICD-10-CM

## 2016-07-21 DIAGNOSIS — J988 Other specified respiratory disorders: Secondary | ICD-10-CM | POA: Diagnosis not present

## 2016-07-21 DIAGNOSIS — Z981 Arthrodesis status: Secondary | ICD-10-CM | POA: Diagnosis not present

## 2016-07-21 DIAGNOSIS — E785 Hyperlipidemia, unspecified: Secondary | ICD-10-CM | POA: Diagnosis present

## 2016-07-21 DIAGNOSIS — Z4682 Encounter for fitting and adjustment of non-vascular catheter: Secondary | ICD-10-CM | POA: Diagnosis not present

## 2016-07-21 DIAGNOSIS — Z79899 Other long term (current) drug therapy: Secondary | ICD-10-CM

## 2016-07-21 DIAGNOSIS — Z8546 Personal history of malignant neoplasm of prostate: Secondary | ICD-10-CM | POA: Diagnosis not present

## 2016-07-21 HISTORY — PX: ANTERIOR CERVICAL DECOMPRESSION/DISCECTOMY FUSION 4 LEVELS: SHX5556

## 2016-07-21 LAB — POCT I-STAT 3, ART BLOOD GAS (G3+)
Acid-Base Excess: 1 mmol/L (ref 0.0–2.0)
Bicarbonate: 25.1 mmol/L (ref 20.0–28.0)
O2 Saturation: 91 %
Patient temperature: 99.3
TCO2: 26 mmol/L (ref 0–100)
pCO2 arterial: 37.7 mmHg (ref 32.0–48.0)
pH, Arterial: 7.433 (ref 7.350–7.450)
pO2, Arterial: 61 mmHg — ABNORMAL LOW (ref 83.0–108.0)

## 2016-07-21 LAB — CBC
HCT: 35.1 % — ABNORMAL LOW (ref 39.0–52.0)
Hemoglobin: 12 g/dL — ABNORMAL LOW (ref 13.0–17.0)
MCH: 32.1 pg (ref 26.0–34.0)
MCHC: 34.2 g/dL (ref 30.0–36.0)
MCV: 93.9 fL (ref 78.0–100.0)
Platelets: 179 10*3/uL (ref 150–400)
RBC: 3.74 MIL/uL — ABNORMAL LOW (ref 4.22–5.81)
RDW: 13.5 % (ref 11.5–15.5)
WBC: 8.3 10*3/uL (ref 4.0–10.5)

## 2016-07-21 LAB — BASIC METABOLIC PANEL
Anion gap: 9 (ref 5–15)
BUN: 10 mg/dL (ref 6–20)
CO2: 25 mmol/L (ref 22–32)
Calcium: 8.7 mg/dL — ABNORMAL LOW (ref 8.9–10.3)
Chloride: 105 mmol/L (ref 101–111)
Creatinine, Ser: 1.08 mg/dL (ref 0.61–1.24)
GFR calc Af Amer: >60 mL/min (ref >60)
GFR calc non Af Amer: >60 mL/min (ref >60)
Glucose, Bld: 163 mg/dL — ABNORMAL HIGH (ref 65–99)
Potassium: 4 mmol/L (ref 3.5–5.1)
Sodium: 139 mmol/L (ref 135–145)

## 2016-07-21 LAB — TRIGLYCERIDES: Triglycerides: 69 mg/dL (ref <150)

## 2016-07-21 LAB — GLUCOSE, CAPILLARY
Glucose-Capillary: 154 mg/dL — ABNORMAL HIGH (ref 65–99)
Glucose-Capillary: 167 mg/dL — ABNORMAL HIGH (ref 65–99)
Glucose-Capillary: 152 mg/dL — ABNORMAL HIGH (ref 65–99)

## 2016-07-21 LAB — MRSA PCR SCREENING: MRSA by PCR: NEGATIVE

## 2016-07-21 LAB — TROPONIN I: Troponin I: <0.03 ng/mL (ref <0.03)

## 2016-07-21 SURGERY — ANTERIOR CERVICAL DECOMPRESSION/DISCECTOMY FUSION 4 LEVELS
Anesthesia: General | Site: Neck

## 2016-07-21 MED ORDER — PROPOFOL 1000 MG/100ML IV EMUL
5.0000 ug/kg/min | INTRAVENOUS | Status: DC
  Administered 2016-07-21: 25 ug/kg/min via INTRAVENOUS
  Administered 2016-07-22 (×2): 20 ug/kg/min via INTRAVENOUS
  Filled 2016-07-21 (×3): qty 100

## 2016-07-21 MED ORDER — GABAPENTIN 300 MG PO CAPS
600.0000 mg | ORAL_CAPSULE | Freq: Three times a day (TID) | ORAL | Status: DC
  Administered 2016-07-22 – 2016-07-23 (×4): 600 mg via ORAL
  Filled 2016-07-21 (×4): qty 2

## 2016-07-21 MED ORDER — MORPHINE SULFATE (PF) 2 MG/ML IV SOLN: 1.0000 mg | INTRAVENOUS | Status: DC | PRN

## 2016-07-21 MED ORDER — THROMBIN 20000 UNITS EX SOLR
CUTANEOUS | Status: DC | PRN
Start: 2016-07-21 — End: 2016-07-21
  Administered 2016-07-21: 20 mL via TOPICAL

## 2016-07-21 MED ORDER — PHENOL 1.4 % MT LIQD: 1.0000 | OROMUCOSAL | Status: DC | PRN

## 2016-07-21 MED ORDER — ONDANSETRON HCL 4 MG/2ML IJ SOLN: 4.0000 mg | INTRAMUSCULAR | Status: DC | PRN

## 2016-07-21 MED ORDER — LACTATED RINGERS IV SOLN
INTRAVENOUS | Status: DC | PRN
  Administered 2016-07-21 (×3): via INTRAVENOUS

## 2016-07-21 MED ORDER — PROMETHAZINE HCL 25 MG/ML IJ SOLN: 6.2500 mg | INTRAMUSCULAR | Status: DC | PRN

## 2016-07-21 MED ORDER — 0.9 % SODIUM CHLORIDE (POUR BTL) OPTIME
TOPICAL | Status: DC | PRN
  Administered 2016-07-21: 1000 mL

## 2016-07-21 MED ORDER — LIDOCAINE 2% (20 MG/ML) 5 ML SYRINGE
INTRAMUSCULAR | Status: DC | PRN
  Administered 2016-07-21: 100 mg via INTRAVENOUS

## 2016-07-21 MED ORDER — PROPOFOL 10 MG/ML IV BOLUS
INTRAVENOUS | Status: DC | PRN
  Administered 2016-07-21: 10 mg via INTRAVENOUS
  Administered 2016-07-21: 80 mg via INTRAVENOUS
  Administered 2016-07-21: 10 mg via INTRAVENOUS

## 2016-07-21 MED ORDER — CHLORHEXIDINE GLUCONATE CLOTH 2 % EX PADS: 6.0000 | MEDICATED_PAD | Freq: Once | CUTANEOUS | Status: DC

## 2016-07-21 MED ORDER — EPHEDRINE 5 MG/ML INJ
INTRAVENOUS | Status: AC
  Filled 2016-07-21: qty 10

## 2016-07-21 MED ORDER — ONDANSETRON HCL 4 MG/2ML IJ SOLN
INTRAMUSCULAR | Status: AC
  Filled 2016-07-21: qty 2

## 2016-07-21 MED ORDER — PANTOPRAZOLE SODIUM 40 MG PO TBEC: 80.0000 mg | DELAYED_RELEASE_TABLET | Freq: Every day | ORAL | Status: DC

## 2016-07-21 MED ORDER — NEOSTIGMINE METHYLSULFATE 5 MG/5ML IV SOSY
PREFILLED_SYRINGE | INTRAVENOUS | Status: AC
  Filled 2016-07-21: qty 5

## 2016-07-21 MED ORDER — METOPROLOL TARTRATE 25 MG PO TABS
12.5000 mg | ORAL_TABLET | Freq: Two times a day (BID) | ORAL | Status: DC
  Administered 2016-07-22 – 2016-07-23 (×3): 12.5 mg via ORAL
  Filled 2016-07-21 (×3): qty 1

## 2016-07-21 MED ORDER — MIDAZOLAM HCL 2 MG/2ML IJ SOLN
0.5000 mg | Freq: Once | INTRAMUSCULAR | Status: AC | PRN
  Administered 2016-07-21: 2 mg via INTRAVENOUS
  Filled 2016-07-21: qty 2

## 2016-07-21 MED ORDER — INSULIN ASPART 100 UNIT/ML ~~LOC~~ SOLN
0.0000 [IU] | SUBCUTANEOUS | Status: DC
  Administered 2016-07-21 (×2): 3 [IU] via SUBCUTANEOUS
  Administered 2016-07-22 (×2): 2 [IU] via SUBCUTANEOUS

## 2016-07-21 MED ORDER — MIDAZOLAM HCL 2 MG/2ML IJ SOLN
INTRAMUSCULAR | Status: AC
  Filled 2016-07-21: qty 2

## 2016-07-21 MED ORDER — FENTANYL CITRATE (PF) 100 MCG/2ML IJ SOLN
INTRAMUSCULAR | Status: AC
  Filled 2016-07-21: qty 2

## 2016-07-21 MED ORDER — DEXAMETHASONE 4 MG PO TABS
4.0000 mg | ORAL_TABLET | Freq: Two times a day (BID) | ORAL | Status: DC
  Administered 2016-07-23: 4 mg via ORAL
  Filled 2016-07-21 (×2): qty 1

## 2016-07-21 MED ORDER — FENTANYL CITRATE (PF) 100 MCG/2ML IJ SOLN
INTRAMUSCULAR | Status: AC
  Filled 2016-07-21: qty 4

## 2016-07-21 MED ORDER — HYDROMORPHONE HCL 1 MG/ML IJ SOLN
0.2500 mg | INTRAMUSCULAR | Status: DC | PRN
  Administered 2016-07-21 – 2016-07-22 (×2): 0.5 mg via INTRAVENOUS
  Filled 2016-07-21 (×2): qty 1

## 2016-07-21 MED ORDER — DEXAMETHASONE SODIUM PHOSPHATE 4 MG/ML IJ SOLN
4.0000 mg | Freq: Two times a day (BID) | INTRAMUSCULAR | Status: DC
  Administered 2016-07-21 – 2016-07-22 (×3): 4 mg via INTRAVENOUS
  Filled 2016-07-21 (×3): qty 1

## 2016-07-21 MED ORDER — FENTANYL CITRATE (PF) 100 MCG/2ML IJ SOLN
INTRAMUSCULAR | Status: DC | PRN
  Administered 2016-07-21 (×5): 50 ug via INTRAVENOUS
  Administered 2016-07-21: 25 ug via INTRAVENOUS
  Administered 2016-07-21: 100 ug via INTRAVENOUS
  Administered 2016-07-21 (×2): 50 ug via INTRAVENOUS
  Administered 2016-07-21: 25 ug via INTRAVENOUS

## 2016-07-21 MED ORDER — TIOTROPIUM BROMIDE MONOHYDRATE 18 MCG IN CAPS: 18.0000 ug | ORAL_CAPSULE | Freq: Every day | RESPIRATORY_TRACT | Status: DC

## 2016-07-21 MED ORDER — ACETAMINOPHEN 325 MG PO TABS: 650.0000 mg | ORAL_TABLET | ORAL | Status: DC | PRN

## 2016-07-21 MED ORDER — ACETAMINOPHEN 650 MG RE SUPP: 650.0000 mg | RECTAL | Status: DC | PRN

## 2016-07-21 MED ORDER — BUDESONIDE 0.25 MG/2ML IN SUSP
0.2500 mg | Freq: Two times a day (BID) | RESPIRATORY_TRACT | Status: DC
  Administered 2016-07-21 – 2016-07-22 (×2): 0.25 mg via RESPIRATORY_TRACT
  Filled 2016-07-21 (×2): qty 2

## 2016-07-21 MED ORDER — DEXMEDETOMIDINE HCL IN NACL 200 MCG/50ML IV SOLN
INTRAVENOUS | Status: DC | PRN
  Administered 2016-07-21: .7 ug/kg/h via INTRAVENOUS

## 2016-07-21 MED ORDER — FAMOTIDINE IN NACL 20-0.9 MG/50ML-% IV SOLN
20.0000 mg | Freq: Two times a day (BID) | INTRAVENOUS | Status: DC
  Administered 2016-07-22: 20 mg via INTRAVENOUS
  Filled 2016-07-21: qty 50

## 2016-07-21 MED ORDER — PHENYLEPHRINE HCL 10 MG/ML IJ SOLN
INTRAVENOUS | Status: DC | PRN
  Administered 2016-07-21: 10 ug/min via INTRAVENOUS

## 2016-07-21 MED ORDER — EPHEDRINE SULFATE 50 MG/ML IJ SOLN
INTRAMUSCULAR | Status: DC | PRN
  Administered 2016-07-21 (×3): 5 mg via INTRAVENOUS

## 2016-07-21 MED ORDER — SODIUM CHLORIDE 0.9% FLUSH: 3.0000 mL | INTRAVENOUS | Status: DC | PRN

## 2016-07-21 MED ORDER — ONDANSETRON HCL 4 MG/2ML IJ SOLN
INTRAMUSCULAR | Status: DC | PRN
Start: 2016-07-21 — End: 2016-07-21
  Administered 2016-07-21: 4 mg via INTRAVENOUS

## 2016-07-21 MED ORDER — MEPERIDINE HCL 25 MG/ML IJ SOLN: 6.2500 mg | INTRAMUSCULAR | Status: DC | PRN

## 2016-07-21 MED ORDER — ATORVASTATIN CALCIUM 80 MG PO TABS
80.0000 mg | ORAL_TABLET | Freq: Every evening | ORAL | Status: DC
  Administered 2016-07-22: 80 mg via ORAL
  Filled 2016-07-21 (×2): qty 1

## 2016-07-21 MED ORDER — PHENYLEPHRINE HCL 10 MG/ML IJ SOLN
0.0000 ug/min | INTRAVENOUS | Status: DC
  Filled 2016-07-21: qty 1

## 2016-07-21 MED ORDER — LIDOCAINE 2% (20 MG/ML) 5 ML SYRINGE
INTRAMUSCULAR | Status: AC
  Filled 2016-07-21: qty 5

## 2016-07-21 MED ORDER — ROCURONIUM BROMIDE 10 MG/ML (PF) SYRINGE
PREFILLED_SYRINGE | INTRAVENOUS | Status: DC | PRN
  Administered 2016-07-21 (×2): 50 mg via INTRAVENOUS

## 2016-07-21 MED ORDER — ALLOPURINOL 100 MG PO TABS
100.0000 mg | ORAL_TABLET | Freq: Every day | ORAL | Status: DC
  Administered 2016-07-22 – 2016-07-23 (×2): 100 mg via ORAL
  Filled 2016-07-21 (×2): qty 1

## 2016-07-21 MED ORDER — ROCURONIUM BROMIDE 50 MG/5ML IV SOSY
PREFILLED_SYRINGE | INTRAVENOUS | Status: AC
  Filled 2016-07-21: qty 5

## 2016-07-21 MED ORDER — SODIUM CHLORIDE 0.9% FLUSH
3.0000 mL | Freq: Two times a day (BID) | INTRAVENOUS | Status: DC
  Administered 2016-07-21 – 2016-07-23 (×3): 3 mL via INTRAVENOUS

## 2016-07-21 MED ORDER — ALBUTEROL SULFATE HFA 108 (90 BASE) MCG/ACT IN AERS: 2.0000 | INHALATION_SPRAY | Freq: Four times a day (QID) | RESPIRATORY_TRACT | Status: DC | PRN

## 2016-07-21 MED ORDER — AMLODIPINE BESYLATE 5 MG PO TABS
5.0000 mg | ORAL_TABLET | Freq: Every day | ORAL | Status: DC
  Administered 2016-07-22: 5 mg via ORAL
  Filled 2016-07-21 (×2): qty 1

## 2016-07-21 MED ORDER — DEXMEDETOMIDINE HCL IN NACL 200 MCG/50ML IV SOLN: 0.4000 ug/kg/h | INTRAVENOUS | Status: DC

## 2016-07-21 MED ORDER — MIDAZOLAM HCL 5 MG/5ML IJ SOLN
INTRAMUSCULAR | Status: DC | PRN
  Administered 2016-07-21: 2 mg via INTRAVENOUS

## 2016-07-21 MED ORDER — MUPIROCIN 2 % EX OINT
TOPICAL_OINTMENT | CUTANEOUS | Status: AC
  Filled 2016-07-21: qty 22

## 2016-07-21 MED ORDER — SODIUM CHLORIDE 0.9 % IV SOLN
250.0000 mL | INTRAVENOUS | Status: DC
  Administered 2016-07-21: 250 mL via INTRAVENOUS

## 2016-07-21 MED ORDER — LIDOCAINE-EPINEPHRINE (PF) 2 %-1:200000 IJ SOLN
INTRAMUSCULAR | Status: AC
  Filled 2016-07-21: qty 20

## 2016-07-21 MED ORDER — THROMBIN 20000 UNITS EX SOLR
CUTANEOUS | Status: AC
  Filled 2016-07-21: qty 20000

## 2016-07-21 MED ORDER — POTASSIUM CHLORIDE IN NACL 20-0.9 MEQ/L-% IV SOLN
INTRAVENOUS | Status: DC
  Administered 2016-07-21: 21:00:00 via INTRAVENOUS
  Administered 2016-07-22: 80 mL/h via INTRAVENOUS
  Administered 2016-07-22: 23:00:00 via INTRAVENOUS
  Filled 2016-07-21 (×5): qty 1000

## 2016-07-21 MED ORDER — TAMSULOSIN HCL 0.4 MG PO CAPS
0.4000 mg | ORAL_CAPSULE | Freq: Every day | ORAL | Status: DC
  Administered 2016-07-22 – 2016-07-23 (×2): 0.4 mg via ORAL
  Filled 2016-07-21 (×2): qty 1

## 2016-07-21 MED ORDER — SODIUM CHLORIDE 0.9 % IV BOLUS (SEPSIS)
500.0000 mL | Freq: Once | INTRAVENOUS | Status: AC
Start: 2016-07-21 — End: 2016-07-21
  Administered 2016-07-21: 500 mL via INTRAVENOUS

## 2016-07-21 MED ORDER — PROPOFOL 10 MG/ML IV BOLUS
INTRAVENOUS | Status: AC
  Filled 2016-07-21: qty 40

## 2016-07-21 MED ORDER — LIDOCAINE-EPINEPHRINE 0.5 %-1:200000 IJ SOLN
INTRAMUSCULAR | Status: DC | PRN
  Administered 2016-07-21: 7 mL

## 2016-07-21 MED ORDER — DEXAMETHASONE SODIUM PHOSPHATE 10 MG/ML IJ SOLN
INTRAMUSCULAR | Status: AC
  Filled 2016-07-21: qty 1

## 2016-07-21 MED ORDER — COLCHICINE 0.6 MG PO TABS: 0.6000 mg | ORAL_TABLET | Freq: Every day | ORAL | Status: DC

## 2016-07-21 MED ORDER — CEFAZOLIN SODIUM-DEXTROSE 2-4 GM/100ML-% IV SOLN
2.0000 g | INTRAVENOUS | Status: AC
Start: 2016-08-23 — End: 2016-07-21
  Administered 2016-07-21 (×2): 2 g via INTRAVENOUS

## 2016-07-21 MED ORDER — MENTHOL 3 MG MT LOZG: 1.0000 | LOZENGE | OROMUCOSAL | Status: DC | PRN

## 2016-07-21 MED ORDER — IPRATROPIUM-ALBUTEROL 0.5-2.5 (3) MG/3ML IN SOLN
3.0000 mL | Freq: Four times a day (QID) | RESPIRATORY_TRACT | Status: DC
  Administered 2016-07-21 – 2016-07-22 (×5): 3 mL via RESPIRATORY_TRACT
  Filled 2016-07-21 (×5): qty 3

## 2016-07-21 MED ORDER — DEXAMETHASONE SODIUM PHOSPHATE 10 MG/ML IJ SOLN
INTRAMUSCULAR | Status: DC | PRN
  Administered 2016-07-21 (×2): 10 mg via INTRAVENOUS

## 2016-07-21 MED ORDER — PANTOPRAZOLE SODIUM 40 MG IV SOLR
40.0000 mg | Freq: Every day | INTRAVENOUS | Status: DC
  Administered 2016-07-21: 40 mg via INTRAVENOUS
  Filled 2016-07-21: qty 40

## 2016-07-21 SURGICAL SUPPLY — 85 items
ADH SKN CLS APL DERMABOND .7 (GAUZE/BANDAGES/DRESSINGS) ×1
BIT DRILL NEURO 2X3.1 SFT TUCH (MISCELLANEOUS) ×1 IMPLANT
BLADE CLIPPER SURG (BLADE) IMPLANT
BNDG GAUZE ELAST 4 BULKY (GAUZE/BANDAGES/DRESSINGS) IMPLANT
BONE CERV LORDOTIC 14.5X12X9 (Bone Implant) ×4 IMPLANT
BUR DRUM 4.0 (BURR) ×1 IMPLANT
BUR MATCHSTICK NEURO 3.0 LAGG (BURR) ×1 IMPLANT
CANISTER SUCT 3000ML PPV (MISCELLANEOUS) ×2 IMPLANT
CARTRIDGE OIL MAESTRO DRILL (MISCELLANEOUS) ×1 IMPLANT
DECANTER SPIKE VIAL GLASS SM (MISCELLANEOUS) ×2 IMPLANT
DERMABOND ADVANCED (GAUZE/BANDAGES/DRESSINGS) ×1
DERMABOND ADVANCED .7 DNX12 (GAUZE/BANDAGES/DRESSINGS) ×1 IMPLANT
DIFFUSER DRILL AIR PNEUMATIC (MISCELLANEOUS) ×2 IMPLANT
DRAPE HALF SHEET 40X57 (DRAPES) IMPLANT
DRAPE LAPAROTOMY 100X72 PEDS (DRAPES) ×2 IMPLANT
DRAPE MICROSCOPE LEICA (MISCELLANEOUS) ×2 IMPLANT
DRAPE POUCH INSTRU U-SHP 10X18 (DRAPES) ×2 IMPLANT
DRILL NEURO 2X3.1 SOFT TOUCH (MISCELLANEOUS) ×2
DURAPREP 6ML APPLICATOR 50/CS (WOUND CARE) ×2 IMPLANT
ELECT COATED BLADE 2.86 ST (ELECTRODE) ×2 IMPLANT
ELECT REM PT RETURN 9FT ADLT (ELECTROSURGICAL) ×2
ELECTRODE REM PT RTRN 9FT ADLT (ELECTROSURGICAL) ×1 IMPLANT
EVACUATOR SILICONE 100CC (DRAIN) ×1 IMPLANT
GAUZE SPONGE 4X4 16PLY XRAY LF (GAUZE/BANDAGES/DRESSINGS) ×1 IMPLANT
GLOVE BIO SURGEON STRL SZ 6.5 (GLOVE) IMPLANT
GLOVE BIO SURGEON STRL SZ7 (GLOVE) IMPLANT
GLOVE BIO SURGEON STRL SZ7.5 (GLOVE) IMPLANT
GLOVE BIO SURGEON STRL SZ8 (GLOVE) IMPLANT
GLOVE BIO SURGEON STRL SZ8.5 (GLOVE) IMPLANT
GLOVE BIOGEL M 8.0 STRL (GLOVE) IMPLANT
GLOVE BIOGEL PI IND STRL 6.5 (GLOVE) IMPLANT
GLOVE BIOGEL PI IND STRL 7.5 (GLOVE) IMPLANT
GLOVE BIOGEL PI INDICATOR 6.5 (GLOVE) ×4
GLOVE BIOGEL PI INDICATOR 7.5 (GLOVE) ×2
GLOVE ECLIPSE 6.5 STRL STRAW (GLOVE) ×3 IMPLANT
GLOVE ECLIPSE 7.0 STRL STRAW (GLOVE) IMPLANT
GLOVE ECLIPSE 7.5 STRL STRAW (GLOVE) IMPLANT
GLOVE ECLIPSE 8.0 STRL XLNG CF (GLOVE) IMPLANT
GLOVE ECLIPSE 8.5 STRL (GLOVE) IMPLANT
GLOVE ECLIPSE 9.0 STRL (GLOVE) ×1 IMPLANT
GLOVE EXAM NITRILE LRG STRL (GLOVE) IMPLANT
GLOVE EXAM NITRILE XL STR (GLOVE) IMPLANT
GLOVE EXAM NITRILE XS STR PU (GLOVE) IMPLANT
GLOVE INDICATOR 6.5 STRL GRN (GLOVE) IMPLANT
GLOVE INDICATOR 7.0 STRL GRN (GLOVE) IMPLANT
GLOVE INDICATOR 7.5 STRL GRN (GLOVE) IMPLANT
GLOVE INDICATOR 8.0 STRL GRN (GLOVE) IMPLANT
GLOVE INDICATOR 8.5 STRL (GLOVE) IMPLANT
GLOVE OPTIFIT SS 8.0 STRL (GLOVE) IMPLANT
GLOVE SURG SS PI 6.5 STRL IVOR (GLOVE) ×2 IMPLANT
GLOVE SURG SS PI 7.0 STRL IVOR (GLOVE) ×3 IMPLANT
GOWN STRL REUS W/ TWL LRG LVL3 (GOWN DISPOSABLE) ×2 IMPLANT
GOWN STRL REUS W/ TWL XL LVL3 (GOWN DISPOSABLE) IMPLANT
GOWN STRL REUS W/TWL 2XL LVL3 (GOWN DISPOSABLE) IMPLANT
GOWN STRL REUS W/TWL LRG LVL3 (GOWN DISPOSABLE) ×8
GOWN STRL REUS W/TWL XL LVL3 (GOWN DISPOSABLE) ×4
GRAFT BNE SPCR VG2 14.5X12X9 (Bone Implant) IMPLANT
HALTER HD/CHIN CERV TRACTION D (MISCELLANEOUS) ×2 IMPLANT
HEMOSTAT SURGICEL 2X14 (HEMOSTASIS) IMPLANT
KIT BASIN OR (CUSTOM PROCEDURE TRAY) ×2 IMPLANT
KIT ROOM TURNOVER OR (KITS) ×2 IMPLANT
NDL HYPO 25X1 1.5 SAFETY (NEEDLE) ×1 IMPLANT
NDL SPNL 22GX3.5 QUINCKE BK (NEEDLE) ×2 IMPLANT
NEEDLE HYPO 25X1 1.5 SAFETY (NEEDLE) ×2 IMPLANT
NEEDLE SPNL 22GX3.5 QUINCKE BK (NEEDLE) ×4 IMPLANT
NS IRRIG 1000ML POUR BTL (IV SOLUTION) ×2 IMPLANT
OIL CARTRIDGE MAESTRO DRILL (MISCELLANEOUS) ×2
PACK LAMINECTOMY NEURO (CUSTOM PROCEDURE TRAY) ×2 IMPLANT
PAD ARMBOARD 7.5X6 YLW CONV (MISCELLANEOUS) ×3 IMPLANT
PIN DISTRACTION 14MM (PIN) ×4 IMPLANT
PLATE 78MM (Plate) ×1 IMPLANT
RUBBERBAND STERILE (MISCELLANEOUS) ×4 IMPLANT
SCREW SELF DRILL VARIABLE 13MM (Screw) ×5 IMPLANT
SCREW SELF TAP 13MM VARIABLE (Screw) ×2 IMPLANT
SPACER CC-ACF 8MM PARALLEL (Bone Implant) ×1 IMPLANT
SPACER PARALLEL 6MM CC ACF (Bone Implant) ×1 IMPLANT
SPACER PARALLELL CC-ACF 5MM (Bone Implant) ×1 IMPLANT
SPONGE INTESTINAL PEANUT (DISPOSABLE) ×2 IMPLANT
SPONGE SURGIFOAM ABS GEL 100 (HEMOSTASIS) IMPLANT
SUT VIC AB 0 CT1 27 (SUTURE) ×2
SUT VIC AB 0 CT1 27XBRD ANTBC (SUTURE) ×1 IMPLANT
SUT VIC AB 3-0 SH 8-18 (SUTURE) ×2 IMPLANT
TOWEL OR 17X24 6PK STRL BLUE (TOWEL DISPOSABLE) ×2 IMPLANT
TOWEL OR 17X26 10 PK STRL BLUE (TOWEL DISPOSABLE) ×2 IMPLANT
WATER STERILE IRR 1000ML POUR (IV SOLUTION) ×2 IMPLANT

## 2016-07-21 NOTE — Transfer of Care (Signed)
Immediate Anesthesia Transfer of Care Note  Patient: [name]  Procedure(s) Performed: Procedure(s): ANTERIOR CERVICAL DECOMPRESSION/DISCECTOMY FUSION CERVICAL TWO-THREE, CERVICAL THREE-FOUR,. CERVICAL FOUR-FIVE, CERVICAL  FIVE-SIX (N/A)  Patient Location: ICU  Anesthesia Type:General  Level of Consciousness: sedated and unresponsive  Airway & Oxygen Therapy: Patient remains intubated per anesthesia plan and Patient placed on Ventilator (see vital sign flow sheet for setting)  Post-op Assessment: Report given to RN and Post -op Vital signs reviewed and stable  Post vital signs: Reviewed and stable  Last Vitals:  Vitals:   07/21/16 0650  BP: (!) 149/76  Pulse: (!) 49  Resp: 18  Temp: 36.5 C    Last Pain: There were no vitals filed for this visit.       Complications: No apparent anesthesia complications

## 2016-07-21 NOTE — Consult Note (Signed)
PULMONARY / CRITICAL CARE MEDICINE   Name: [name] MRN: [id number] DOB: [birth year]    ADMISSION DATE:  07/21/2016 CONSULTATION DATE:  07/21/2016   REFERRING MD:  Dr Christella Noa  CHIEF COMPLAINT: post op acute resp failure  HISTORY OF PRESENT ILLNESS:    70 year old male with prior MI, ongoing smoking 50 ppd, copd nos, bp, open heart surgery nos 2000 / Has had myelopathy motor symptoms for 8 months and underwent s/p ACDF c2-6 x 6h surgery by Dr Cyndy Freeze. Anticipatied neck swelling and is s/p anterior drain and s/p 2 doses decadron during surgery. Returned to OR intubaed (at time of arrival under influence of neuromuscular blockade) and on precedex with neosnephrine.   PAST MEDICAL HISTORY :  He  has a past medical history of Anginal pain ([place]); CAD (coronary artery disease); COPD (chronic obstructive pulmonary disease) ([place]); Dyspnea; ETOH abuse; GERD (gastroesophageal reflux disease); Hypercholesterolemia; Hypertension; Myocardial infarction; Prostate cancer ([place]); and Tobacco abuse.  PAST SURGICAL HISTORY: He  has a past surgical history that includes Prostate surgery; Cardiac surgery; Colonoscopy; Back surgery; Esophagogastroduodenoscopy (05/27/2012); Cardiac catheterization (2011); Coronary angioplasty; and Coronary artery bypass graft.  Allergies  Allergen Reactions  . No Known Allergies     No current facility-administered medications on file prior to encounter.    Current Outpatient Prescriptions on File Prior to Encounter  Medication Sig  . amLODipine (NORVASC) 5 MG tablet Take 1 tablet (5 mg total) by mouth daily. (Patient taking differently: Take 5 mg by mouth at bedtime. )  . aspirin 81 MG tablet Take 81 mg by mouth at bedtime.   Marland Kitchen atorvastatin (LIPITOR) 80 MG tablet Take 1 tablet (80 mg total) by mouth daily. (Patient taking differently: Take 80 mg by mouth every evening. )  . metoprolol tartrate (LOPRESSOR) 25 MG tablet Take 0.5 tablets (12.5 mg total) by mouth 2 (two)  times daily.  Marland Kitchen omega-3 acid ethyl esters (LOVAZA) 1 g capsule TAKE ONE CAPSULE BY MOUTH TWICE DAILY (NEEDS APPOINTMENT FOR REFILLS)  . tiotropium (SPIRIVA) 18 MCG inhalation capsule Place 1 capsule (18 mcg total) into inhaler and inhale daily.  Marland Kitchen albuterol (PROVENTIL HFA;VENTOLIN HFA) 108 (90 Base) MCG/ACT inhaler Inhale 2 puffs into the lungs every 6 (six) hours as needed for wheezing or shortness of breath.  . colchicine 0.6 MG tablet Take 1 tablet (0.6 mg total) by mouth daily.    FAMILY HISTORY:  His indicated that his mother is deceased. He indicated that his father is deceased. He indicated that his sister is alive. He indicated that both of his brothers are alive. He indicated that the status of his neg hx is unknown.    SOCIAL HISTORY: He  reports that he has been smoking Cigarettes and E-cigarettes.  He started smoking about 46 years ago. He has a 12.50 pack-year smoking history. His smokeless tobacco use includes Snuff and Chew. He reports that he drinks alcohol. He reports that he does not use drugs.  VITAL SIGNS: BP 120/73   Pulse 69   Temp 97.7 F (36.5 C)   Resp 16   Wt 74.8 kg (165 lb)   SpO2 100%   BMI 26.63 kg/m   HEMODYNAMICS:    VENTILATOR SETTINGS: Vent Mode: PRVC FiO2 (%):  [40 %] 40 % Set Rate:  [16 bmp] 16 bmp Vt Set:  [630 mL] 630 mL PEEP:  [5 cmH20] 5 cmH20 Plateau Pressure:  [16 cmH20] 16 cmH20  INTAKE / OUTPUT: No intake/output data recorded.  PHYSICAL EXAMINATION:  General:  Well built male.  Neuro:  Sedated and paralzed x 1 post op on vent.  HEENT:  ETT +, anterior cervical drain + Cardiovascular:  Normal heart sounds Lungs:  CTA bilaterally Abdomen:  Soft, no mass Musculoskeletal:  No cyanosis, no clubbing, no edema Skin:  Intact on excposed areas  LABS:  BMET No results for input(s): NA, K, CL, CO2, BUN, CREATININE, GLUCOSE in the last 168 hours.  Electrolytes No results for input(s): CALCIUM, MG, PHOS in the last 168  hours.  CBC No results for input(s): WBC, HGB, HCT, PLT in the last 168 hours.  Coag's No results for input(s): APTT, INR in the last 168 hours.  Sepsis Markers No results for input(s): LATICACIDVEN, PROCALCITON, O2SATVEN in the last 168 hours.  ABG No results for input(s): PHART, PCO2ART, PO2ART in the last 168 hours.  Liver Enzymes No results for input(s): AST, ALT, ALKPHOS, BILITOT, ALBUMIN in the last 168 hours.  Cardiac Enzymes No results for input(s): TROPONINI, PROBNP in the last 168 hours.  Glucose No results for input(s): GLUCAP in the last 168 hours.  Imaging No results found.  ASSESSMENT / PLAN:  PULMONARY A: Baseline: hx of copd nos - on home spiriva Acute post op respiratory failure post ACDF c2-6 with prolonged surgery and at risk for upper airway edema P:   Full vent support Steroids per nsgy Aim to extubate 07/22/16 duoneb and pulmicort neb  CARDIOVASCULAR A:  Hx of cad and hypetension - aspirin, lipitor, nnorvasc. Echo 2014 - ok    - post op needing neo and in mild circulatory shock. Could be precedex mediated P:  Fluid bolus and reasess ceck troponin Resume home aspirin 07/22/16   RENAL A:   At risk for AKI P:   Check bmet  GASTROINTESTINAL A:   Hx of gerd P:   ppi  HEMATOLOGIC A:   In need of dvt prop P:  scd and when stable lovenox/heparin  INFECTIOUS A:   At risk for infection P:   monitor  ENDOCRINE A:   No hx of dm   P:   ssi  NEUROLOGIC A:   currntly sedated   P:   Change precededx to diprivan RASS goal: *0    FAMILY  - Updates: none at bedside 07/21/16  - Inter-disciplinary family meet or Palliative Care meeting due by:  *day 7 which is 07/27/16      The patient is critically ill with multiple organ systems failure and requires high complexity decision making for assessment and support, frequent evaluation and titration of therapies, application of advanced monitoring technologies and extensive  interpretation of multiple databases.   Critical Care Time devoted to patient care services described in this note is  30  Minutes. This time reflects time of care of this signee Dr Brand Males. This critical care time does not reflect procedure time, or teaching time or supervisory time of PA/NP/Med student/Med Resident etc but could involve care discussion time    Dr. Brand Males, M.D., [facility].C.P Pulmonary and Critical Care Medicine Staff Physician [place] [facility] Pager: [phone], If no answer or between  15:00h - 7:00h: call [phone]  07/21/2016 5:30 PM

## 2016-07-21 NOTE — H&P (Signed)
BP (!) 149/76   Pulse (!) 49   Temp 97.7 F (36.5 C)   Resp 18   Wt 74.8 kg (165 lb)   SpO2 100%   BMI 26.63 kg/m  Mr. Aundrae Bator comes in today for evaluation of pain that he has in his neck, numbness that he has in his hands, numbness that he has in his upper extremities. He says this has been going on for about the last eight months. He, for example, is unable to pick up small objects because he just cannot get his hands and fingers to work well enough. He cannot button his shirt because he cannot get his fingers and hands to work well enough. He says he woke up one time and it just felt like he had a crick in his neck. Nothing in particular happened to him, but this is just how he feels. He is 70 years of age, right-handed. He says his pain has gotten worse, and he is hurting and numb all the time, in his words. He knows that he is weak in his hands and he says that corresponds to the same eight to nine months. REVIEW OF SYSTEMS: Review of systems positive for hearing loss, sore throat, chronic cough, shortness of breath, difficulty with urinary stream, neck pain, and arm weakness. PAST MEDICAL HISTORY: Past medical history includes myocardial infarction, hypertension, and chronic obstructive pulmonary disease.   Prior Operations: He did undergo open-heart surgery in 2000 and had back surgery in 1998.  Medications and Allergies: He has no known drug allergies. He takes Atorvastatin, Omega-3 acid, Amlodipine, Metoprolol, Tamsulosin, Spiriva, Albuterol, and Gabapentin. FAMILY HISTORY: Mother and father are both deceased. Mother died secondary to diabetes. Father died secondary to heart disease. Hypertension also present in the family history. SOCIAL HISTORY: He has smoke 1/2 pack of cigarettes a day for the last 50 years. He does drink socially. No history of substance abuse. PHYSICAL EXAMINATION: Vital signs: Height 5 feet 6 inches, weight 160 pounds. Temperature is 96.5, blood pressure is  129/75, pulse is 53, respiratory rate 14, pain is 10/10. NEUROLOGICAL EXAMINATION: He is alert, oriented x 4, and answering all questions appropriately. He has 3+ reflexes in the upper and lower extremities. He has spread to hand flexion from the brachioradialis reflex from his biceps also. He has pectoralis reflexes, suprapatellar reflexes, cross adductor reflexes, ankle jerks. He has a spastic gait. He has a positive Romberg. Coordination is quite poor in his upper extremities. He has good strength in the biceps, triceps, deltoids, but he has very weak grips bilaterally and weak intrinsics. IMAGING STUDIES: MRI is reviewed. What is shows is fairly severe stenosis at 2-3, 3-4, 4-5 where he has altered cord signal and at 5-6. The 6-7 is not great but is not nearly as bad as these others. He has need for more than likely anterior and posterior decompression. I would stage these. But I think anterior approach would do well initially secondary to the large osteophytes and ossified posterior longitudinal ligament which he displays. We will do some corpectomies, but at least for right now we will just do some diskectomies and maybe a mix of the corpectomy and diskectomies, but either way it need decompressing, and we will get this scheduled.

## 2016-07-21 NOTE — Progress Notes (Signed)
eLink Physician-Brief Progress Note Patient Name: [name] DOB: [birth year] MRN: [id number]   Date of Service  07/21/2016  HPI/Events of Note  70 yo male s/p C2-C6 spinal fusion. Concern for airway and swallowing d/t surgery. Patient to remain intubated and ventilated overnight. Plan for extubation in AM.  eICU Interventions  Will order: 1. Ventilator orders: 45%/PRVC 16/TV 630/P 5. 2. ABG at 6:30 PM. 3. Portable CXR STAT. 4. Pepcid 20 mg IV Q 12 hours.  5. Place SCD's to bilateral LE's. 6. Precedex IV infusion. Titrate to RASS = -1 to -2. 7. Phenylephrine IV infusion. Titrate to MAP > 65.     Intervention Category Major Interventions: Respiratory failure - evaluation and management  [name] [place] 07/21/2016, 5:17 PM

## 2016-07-21 NOTE — Op Note (Signed)
07/21/2016  5:05 PM  PATIENT:  [name]  70 y.o. male with cervical spondylosis at C2/3,3/4,4/5,and 5/6 causing severe spinal stenosis, myelopathy, and radiculopathy. He is admitted today for cervical decompression and fusion.   PRE-OPERATIVE DIAGNOSIS:  CERVICAL SPONDYLOSIS WITH MYELOPATHY AND RADICULOPATHY C2/3,3/4,4/5,and 5/6  POST-OPERATIVE DIAGNOSIS:  CERVICAL SPONDYLOSIS WITH MYELOPATHY AND RADICULOPATHY C2/3, 3/4, 4/5, and 5/6  PROCEDURE:  Anterior Cervical decompression C2, 3,4,5,6 Arthrodesis C2/3,3/4,4/5,5/6 with 6,8,9,and 63mm structural allografts Anterior instrumentation(Nuvasive) C3-6, Helix translational plate  SURGEON:   Surgeon(s): Ashok Pall, MD Earnie Larsson, MD   ASSISTANTS:Pool, henry  ANESTHESIA:   general  EBL:  Total I/O In: 2500 [I.V.:2500] Out: [id number] [Urine:735; Blood:400]  BLOOD ADMINISTERED:none  CELL SAVER GIVEN:none  COUNT:per nursing  DRAINS: (1) Jackson-Pratt drain(s) with closed bulb suction in the prevertebral space   SPECIMEN:  No Specimen  DICTATION: Mr. Wiesemann was taken to the operating room, intubated, and placed under general anesthesia without difficulty. He was positioned supine with his head in slight extension on a horseshoe headrest. The neck was prepped and draped in a sterile manner. I infiltrated 4 cc's 1/2%lidocaine/1:200,000 strength epinephrine into the planned incision starting from the midline to the medial border of the left sternocleidomastoid muscle. I opened the incision with a 10 blade and dissected sharply through soft tissue to the platysma. I dissected in the plane superior to the platysma both rostrally and caudally. I then opened the platysma in a horizontal fashion with Metzenbaum scissors, and dissected in the inferior plane rostrally and caudally. With both blunt and sharp technique I created an avascular corridor to the cervical spine. I placed a spinal needle(s) in the disc space at 5/6 and 3/4 . I then reflected  the longus colli from C2 to C6 and placed self retaining retractors. I opened the disc space(s) at 2/3,3/4,4/5,and 5/6 with a 15 blade. I removed disc with curettes, Kerrison punches, and the drill. Using the drill I removed osteophytes and prepared for the decompression. Each level was quite spondylitic and narrowed.  I decompressed the spinal canal and the C3,4,5,and 6 root(s) with the drill, Kerrison punches, and the curettes. I used the microscope to aid in microdissection. I removed the posterior longitudinal ligament to fully expose and decompress the thecal sac. I exposed the roots laterally taking down the 3/4,4/5, and 5/6 uncovertebral joints. With the decompression complete we moved on to the arthrodesis. I  used the drill to level the surfaces of C2,3,4,5,and 6. I removed soft tissue to prepare the disc space and the bony surfaces. I measured the space and placed a 70mm, 43mm, 75mm, and 24mm structural allografts into the disc spaces at the 2/3,3/4,4/5, and 5/6 disc spaces respectively.  I then placed the anterior instrumentation. I placed 2 screws in C2,C3 and C6, 1 screw at C4 and none in the  C5 vertebral body. I locked the screws into place. Intraoperative xray showed the graft, plate, and screws to be in good position. I irrigated the wound, achieved hemostasis, and closed the wound in layers. I approximated the platysma, and the subcuticular plane with vicryl sutures. I used Dermabond for a sterile dressing.   PLAN OF CARE: Admit to inpatient   PATIENT DISPOSITION:  ICU - intubated and hemodynamically stable.   Delay start of Pharmacological VTE agent (>24hrs) due to surgical blood loss or risk of bleeding:  yes

## 2016-07-22 ENCOUNTER — Inpatient Hospital Stay (HOSPITAL_COMMUNITY): Payer: Medicare Other

## 2016-07-22 DIAGNOSIS — J988 Other specified respiratory disorders: Secondary | ICD-10-CM

## 2016-07-22 LAB — MAGNESIUM: Magnesium: 1.4 mg/dL — ABNORMAL LOW (ref 1.7–2.4)

## 2016-07-22 LAB — BASIC METABOLIC PANEL
Anion gap: 8 (ref 5–15)
BUN: 16 mg/dL (ref 6–20)
CO2: 24 mmol/L (ref 22–32)
Calcium: 8.6 mg/dL — ABNORMAL LOW (ref 8.9–10.3)
Chloride: 106 mmol/L (ref 101–111)
Creatinine, Ser: 1.1 mg/dL (ref 0.61–1.24)
GFR calc Af Amer: >60 mL/min (ref >60)
GFR calc non Af Amer: >60 mL/min (ref >60)
Glucose, Bld: 135 mg/dL — ABNORMAL HIGH (ref 65–99)
Potassium: 4.2 mmol/L (ref 3.5–5.1)
Sodium: 138 mmol/L (ref 135–145)

## 2016-07-22 LAB — GLUCOSE, CAPILLARY
Glucose-Capillary: 106 mg/dL — ABNORMAL HIGH (ref 65–99)
Glucose-Capillary: 113 mg/dL — ABNORMAL HIGH (ref 65–99)
Glucose-Capillary: 113 mg/dL — ABNORMAL HIGH (ref 65–99)
Glucose-Capillary: 120 mg/dL — ABNORMAL HIGH (ref 65–99)
Glucose-Capillary: 126 mg/dL — ABNORMAL HIGH (ref 65–99)
Glucose-Capillary: 131 mg/dL — ABNORMAL HIGH (ref 65–99)

## 2016-07-22 LAB — CBC WITH DIFFERENTIAL/PLATELET
Basophils Absolute: 0 10*3/uL (ref 0.0–0.1)
Basophils Relative: 0 %
Eosinophils Absolute: 0 10*3/uL (ref 0.0–0.7)
Eosinophils Relative: 0 %
HCT: 31.4 % — ABNORMAL LOW (ref 39.0–52.0)
Hemoglobin: 10.5 g/dL — ABNORMAL LOW (ref 13.0–17.0)
Lymphocytes Relative: 14 %
Lymphs Abs: 1.1 10*3/uL (ref 0.7–4.0)
MCH: 31.4 pg (ref 26.0–34.0)
MCHC: 33.4 g/dL (ref 30.0–36.0)
MCV: 94 fL (ref 78.0–100.0)
Monocytes Absolute: 0.3 10*3/uL (ref 0.1–1.0)
Monocytes Relative: 4 %
Neutro Abs: 6.8 10*3/uL (ref 1.7–7.7)
Neutrophils Relative %: 82 %
Platelets: 162 10*3/uL (ref 150–400)
RBC: 3.34 MIL/uL — ABNORMAL LOW (ref 4.22–5.81)
RDW: 13.8 % (ref 11.5–15.5)
WBC: 8.2 10*3/uL (ref 4.0–10.5)

## 2016-07-22 LAB — PHOSPHORUS: Phosphorus: 2.1 mg/dL — ABNORMAL LOW (ref 2.5–4.6)

## 2016-07-22 LAB — TROPONIN I: Troponin I: <0.03 ng/mL (ref <0.03)

## 2016-07-22 MED ORDER — PANTOPRAZOLE SODIUM 40 MG PO TBEC
40.0000 mg | DELAYED_RELEASE_TABLET | Freq: Every day | ORAL | Status: DC
  Administered 2016-07-22: 40 mg via ORAL
  Filled 2016-07-22: qty 1

## 2016-07-22 MED ORDER — FENTANYL CITRATE (PF) 100 MCG/2ML IJ SOLN
50.0000 ug | INTRAMUSCULAR | Status: DC | PRN
  Administered 2016-07-22 (×3): 100 ug via INTRAVENOUS
  Filled 2016-07-22 (×3): qty 2

## 2016-07-22 MED ORDER — MIDAZOLAM HCL 2 MG/2ML IJ SOLN: 1.0000 mg | INTRAMUSCULAR | Status: DC | PRN

## 2016-07-22 NOTE — Progress Notes (Signed)
PT Cancellation Note  Patient Details Name: [name] MRN: [id number] DOB: 02/27/1946   Cancelled Treatment:    Reason Eval/Treat Not Completed: Patient not medically ready (intubated at this time)   Duncan Dull 07/22/2016, 9:02 AM Alben Deeds, PT DPT  [phone]

## 2016-07-22 NOTE — Progress Notes (Signed)
Pt agitated and trying to get out of be. JP drain came completely out of the incision. Site clean and dry. MD called and instructed RN to apply pressure, place a gauze dressing and bandage. Will continue to assess closely.  Justin Mend, RN

## 2016-07-22 NOTE — Progress Notes (Signed)
Pt very combative and reaching for ETT despite sedation medication given already. Called eLink MD for new sedation orders and to apply restraints. Will administer new medication orders and will monitor closely.  Justin Mend, RN

## 2016-07-22 NOTE — Progress Notes (Signed)
   RN calling eMD  S: agitated  Plan - continue dipriva - add fent prn - add versed prn - add restraints - if no response, consider haldol prn   Dr. Brand Males, M.D., [facility].C.P Pulmonary and Critical Care Medicine Staff Physician [place] [facility] Pager: [phone], If no answer or between  15:00h - 7:00h: call [phone]  07/22/2016 1:30 AM

## 2016-07-22 NOTE — Progress Notes (Signed)
PCCM PROGRESS NOTE  Subjective: Tolerating SBT  Vital signs: BP (!) 142/73   Pulse 78   Temp 98.7 F (37.1 C) (Oral)   Resp 18   Ht 5\' 6"  (1.676 m)   Wt 170 lb 10.2 oz (77.4 kg)   SpO2 100%   BMI 27.54 kg/m   Intake/output: I/O last 3 completed shifts: In: 3957.4 [I.V.:3457.4; IV Piggyback:500] Out: [id number] [Urine:1125; Drains:125; Blood:400]  General: alert Neuro: moves all extremities HEENT: ETT in place Cardiac: regular, tachycardic Chest: no wheeze Abd: soft, non tender Ext: no edema Skin: no rashes  CMP Latest Ref Rng & Units 07/22/2016 07/21/2016 07/13/2016  Glucose 65 - 99 mg/dL 135(H) 163(H) 102(H)  BUN 6 - 20 mg/dL 16 10 9   Creatinine 0.61 - 1.24 mg/dL 1.10 1.08 1.01  Sodium 135 - 145 mmol/L 138 139 138  Potassium 3.5 - 5.1 mmol/L 4.2 4.0 4.3  Chloride 101 - 111 mmol/L 106 105 102  CO2 22 - 32 mmol/L 24 25 29   Calcium 8.9 - 10.3 mg/dL 8.6(L) 8.7(L) 9.1  Total Protein 6.0 - 8.5 g/dL - - -  Total Bilirubin 0.0 - 1.2 mg/dL - - -  Alkaline Phos 39 - 117 IU/L - - -  AST 0 - 40 IU/L - - -  ALT 0 - 44 IU/L - - -    CBC Latest Ref Rng & Units 07/22/2016 07/21/2016 07/13/2016  WBC 4.0 - 10.5 K/uL 8.2 8.3 6.1  Hemoglobin 13.0 - 17.0 g/dL 10.5(L) 12.0(L) 14.0  Hematocrit 39.0 - 52.0 % 31.4(L) 35.1(L) 41.2  Platelets 150 - 400 K/uL 162 179 193    ABG    Component Value Date/Time   PHART 7.433 07/21/2016 1946   PCO2ART 37.7 07/21/2016 1946   PO2ART 61.0 (L) 07/21/2016 1946   HCO3 25.1 07/21/2016 1946   TCO2 26 07/21/2016 1946   O2SAT 91.0 07/21/2016 1946    CBG (last 3)   Recent Labs  07/21/16 2313 07/22/16 0319 07/22/16 0739  GLUCAP 152* 131* 126*    Imaging: Dg Cervical Spine Complete  Result Date: 07/21/2016 CLINICAL DATA:  Surgery, elective.  ACDF. EXAM: CERVICAL SPINE - COMPLETE 4+ VIEW COMPARISON:  MRI the cervical spine 04/24/2016. FINDINGS: Six intraoperative images are submitted. Image #2:  The C5-6 disc space is localized. Image #3:  Surgical probes are directed at the C3 and C5 vertebral bodies. Image #4: A surgical probe or needle is projected at the C2-3 level. Image #5: Disc spacers are in place at C2-3, C3-4, C4-5, and C5-6 with restoration of disc height at each level. Image #6:  Anterior plate is applied. IMPRESSION: ACDF C2-6 without radiographic evidence for complication. Electronically Signed   By: San Morelle M.D.   On: 07/21/2016 17:27   Dg Chest Port 1 View  Result Date: 07/22/2016 CLINICAL DATA:  Ventilator EXAM: PORTABLE CHEST 1 VIEW COMPARISON:  07/21/2016 FINDINGS: Endotracheal tube remains in stable position. Prior CABG. Heart is upper limits normal in size. Left base atelectasis. No pneumothorax. No confluent opacity on the right. No effusions. IMPRESSION: Left base atelectasis. Electronically Signed   By: Rolm Baptise M.D.   On: 07/22/2016 07:22   Dg Chest Port 1 View  Result Date: 07/21/2016 CLINICAL DATA:  Initial evaluation for endotracheal tube placement. EXAM: PORTABLE CHEST 1 VIEW COMPARISON:  Prior radiograph from earlier the same day. FINDINGS: Endotracheal tube in place with tip positioned 3.3 cm above the carina. Median sternotomy wires underlying CABG markers and surgical clips noted, stable. Cardiomegaly  unchanged. Elevation of the left hemidiaphragm. Lungs remain largely clear mild diffuse bronchitic changes again noted. No pulmonary edema or pleural effusion. No pneumothorax. Osseous structures unchanged. IMPRESSION: 1. Tip of the endotracheal tube 3.3 cm above the carina. 2. Mild diffuse bronchitic changes. No other active cardiopulmonary disease. Electronically Signed   By: Jeannine Boga M.D.   On: 07/21/2016 22:18   Dg Chest Port 1 View  Result Date: 07/21/2016 CLINICAL DATA:  Intubation during surgery EXAM: PORTABLE CHEST 1 VIEW COMPARISON:  Portable exam 1724 hours compared to 11/26/2012 FINDINGS: Tip of endotracheal tube projects 8.0 cm above carina. Upper normal heart size  post CABG. Mediastinal contours and pulmonary vascularity normal. Mild bronchitic changes without pulmonary infiltrate, pleural effusion or pneumothorax. No acute osseous findings. IMPRESSION: Tip of endotracheal tube projects 8.0 cm above carina. Mild bronchitic changes. Prior CABG. Electronically Signed   By: Lavonia Dana M.D.   On: 07/21/2016 17:32    Summary: 70 yo male smoker with neck pain, hand numbness from cervical spondylosis.  Remained on vent post op.  PMHx of Prostate cancer, CAD, HTN, HLD, GERD, COPD, Gout  Assessment/plan:  Compromised airway. - proceed with extubation if he has good cuff leak  Cervical spondylosis s/p anterior decompression. - post op care per neurosurgery  Hx of CAD, HTN, HLD. - norvasc, ASA, lipitor, lopressor, lovaza  Hx of COPD with tobacco abuse. - duoneb  Hx of prostate cancer. - flomax  Updated pt's wife at bedside  CC time 72 minutes  Chesley Mires, MD [place] 07/22/2016, 9:33 AM Pager:  [phone] After 3pm call: [phone]

## 2016-07-22 NOTE — Procedures (Signed)
Extubation Procedure Note  Patient Details:   Name: [name] DOB: July 12, 1946 MRN: [id number]   Airway Documentation:     Evaluation  O2 sats: stable throughout Complications: No apparent complications Patient did tolerate procedure well. Bilateral Breath Sounds: Clear, Diminished   Yes   PT extubated to 4L [place] with sats of 100%.  Pt tolerating well, RN at bedside. RT will continue to monitor.  Pierre Bali 07/22/2016, 9:56 AM

## 2016-07-22 NOTE — Progress Notes (Signed)
Pt agitated and trying to get out of bed. JP drain was pulled out slightly but still in site. JP drain unable to hold a charge. No drainage around site. MD called and updated. Instructed to monitor only. Will continue to assess closely.  Justin Mend, RN

## 2016-07-22 NOTE — Progress Notes (Signed)
Postop day 1. Patient rested on ventilator overnight. Patient awake and moving all 4 extremities well this morning. Next soft. No evidence of hematoma. Drain unfortunately pulled out last night that does not appear to be needed at present. Pulmonary status good.  Overall doing well. Plan for extubation per critical care medicine later this morning area and mobilize once off ventilator.

## 2016-07-23 LAB — CBC WITH DIFFERENTIAL/PLATELET
Basophils Absolute: 0 10*3/uL (ref 0.0–0.1)
Basophils Relative: 0 %
Eosinophils Absolute: 0 10*3/uL (ref 0.0–0.7)
Eosinophils Relative: 0 %
HCT: 30.6 % — ABNORMAL LOW (ref 39.0–52.0)
Hemoglobin: 10.2 g/dL — ABNORMAL LOW (ref 13.0–17.0)
Lymphocytes Relative: 13 %
Lymphs Abs: 1.3 10*3/uL (ref 0.7–4.0)
MCH: 32 pg (ref 26.0–34.0)
MCHC: 33.3 g/dL (ref 30.0–36.0)
MCV: 95.9 fL (ref 78.0–100.0)
Monocytes Absolute: 0.5 10*3/uL (ref 0.1–1.0)
Monocytes Relative: 5 %
Neutro Abs: 8.3 10*3/uL — ABNORMAL HIGH (ref 1.7–7.7)
Neutrophils Relative %: 82 %
Platelets: 162 10*3/uL (ref 150–400)
RBC: 3.19 MIL/uL — ABNORMAL LOW (ref 4.22–5.81)
RDW: 14.3 % (ref 11.5–15.5)
WBC: 10.2 10*3/uL (ref 4.0–10.5)

## 2016-07-23 LAB — BASIC METABOLIC PANEL
Anion gap: 6 (ref 5–15)
BUN: 19 mg/dL (ref 6–20)
CO2: 25 mmol/L (ref 22–32)
Calcium: 8.7 mg/dL — ABNORMAL LOW (ref 8.9–10.3)
Chloride: 106 mmol/L (ref 101–111)
Creatinine, Ser: 0.93 mg/dL (ref 0.61–1.24)
GFR calc Af Amer: >60 mL/min (ref >60)
GFR calc non Af Amer: >60 mL/min (ref >60)
Glucose, Bld: 125 mg/dL — ABNORMAL HIGH (ref 65–99)
Potassium: 4.7 mmol/L (ref 3.5–5.1)
Sodium: 137 mmol/L (ref 135–145)

## 2016-07-23 LAB — PHOSPHORUS: Phosphorus: 2.5 mg/dL (ref 2.5–4.6)

## 2016-07-23 LAB — MAGNESIUM: Magnesium: 1.9 mg/dL (ref 1.7–2.4)

## 2016-07-23 LAB — GLUCOSE, CAPILLARY: Glucose-Capillary: 130 mg/dL — ABNORMAL HIGH (ref 65–99)

## 2016-07-23 MED ORDER — HYDROCODONE-ACETAMINOPHEN 5-325 MG PO TABS: 1.0000 | ORAL_TABLET | Freq: Four times a day (QID) | ORAL | 0 refills | Status: DC | PRN

## 2016-07-23 MED ORDER — TIZANIDINE HCL 4 MG PO TABS: 4.0000 mg | ORAL_TABLET | Freq: Four times a day (QID) | ORAL | 0 refills | Status: DC | PRN

## 2016-07-23 MED ORDER — IPRATROPIUM-ALBUTEROL 0.5-2.5 (3) MG/3ML IN SOLN
3.0000 mL | Freq: Three times a day (TID) | RESPIRATORY_TRACT | Status: DC
  Administered 2016-07-23: 3 mL via RESPIRATORY_TRACT
  Filled 2016-07-23 (×2): qty 3

## 2016-07-23 MED ORDER — ALBUTEROL SULFATE (2.5 MG/3ML) 0.083% IN NEBU: 2.5000 mg | INHALATION_SOLUTION | RESPIRATORY_TRACT | Status: DC | PRN

## 2016-07-23 NOTE — Progress Notes (Signed)
PCCM PROGRESS NOTE  Subjective: Tolerating liquids.  Walked in hall.    Vital signs: BP (!) 143/70   Pulse 80   Temp 98.2 F (36.8 C) (Oral)   Resp 18   Ht 5\' 6"  (1.676 m)   Wt 170 lb 10.2 oz (77.4 kg)   SpO2 93%   BMI 27.54 kg/m   Intake/output: I/O last 3 completed shifts: In: 2151.2 [I.V.:2101.2; IV Piggyback:50] Out: [id number]; Drains:60]  General: alert Neuro: moves all extremities HEENT: no stridor Cardiac: regular Chest: no wheeze Abd: soft, non tender Ext: no edema Skin: no rashes   CMP Latest Ref Rng & Units 07/23/2016 07/22/2016 07/21/2016  Glucose 65 - 99 mg/dL 125(H) 135(H) 163(H)  BUN 6 - 20 mg/dL 19 16 10   Creatinine 0.61 - 1.24 mg/dL 0.93 1.10 1.08  Sodium 135 - 145 mmol/L 137 138 139  Potassium 3.5 - 5.1 mmol/L 4.7 4.2 4.0  Chloride 101 - 111 mmol/L 106 106 105  CO2 22 - 32 mmol/L 25 24 25   Calcium 8.9 - 10.3 mg/dL 8.7(L) 8.6(L) 8.7(L)  Total Protein 6.0 - 8.5 g/dL - - -  Total Bilirubin 0.0 - 1.2 mg/dL - - -  Alkaline Phos 39 - 117 IU/L - - -  AST 0 - 40 IU/L - - -  ALT 0 - 44 IU/L - - -    CBC Latest Ref Rng & Units 07/23/2016 07/22/2016 07/21/2016  WBC 4.0 - 10.5 K/uL 10.2 8.2 8.3  Hemoglobin 13.0 - 17.0 g/dL 10.2(L) 10.5(L) 12.0(L)  Hematocrit 39.0 - 52.0 % 30.6(L) 31.4(L) 35.1(L)  Platelets 150 - 400 K/uL 162 162 179    ABG    Component Value Date/Time   PHART 7.433 07/21/2016 1946   PCO2ART 37.7 07/21/2016 1946   PO2ART 61.0 (L) 07/21/2016 1946   HCO3 25.1 07/21/2016 1946   TCO2 26 07/21/2016 1946   O2SAT 91.0 07/21/2016 1946    CBG (last 3)   Recent Labs  07/22/16 1952 07/22/16 2345 07/23/16 0344  GLUCAP 113* 113* 130*    Imaging: Dg Cervical Spine Complete  Result Date: 07/21/2016 CLINICAL DATA:  Surgery, elective.  ACDF. EXAM: CERVICAL SPINE - COMPLETE 4+ VIEW COMPARISON:  MRI the cervical spine 04/24/2016. FINDINGS: Six intraoperative images are submitted. Image #2:  The C5-6 disc space is localized.  Image #3: Surgical probes are directed at the C3 and C5 vertebral bodies. Image #4: A surgical probe or needle is projected at the C2-3 level. Image #5: Disc spacers are in place at C2-3, C3-4, C4-5, and C5-6 with restoration of disc height at each level. Image #6:  Anterior plate is applied. IMPRESSION: ACDF C2-6 without radiographic evidence for complication. Electronically Signed   By: San Morelle M.D.   On: 07/21/2016 17:27   Dg Chest Port 1 View  Result Date: 07/22/2016 CLINICAL DATA:  Ventilator EXAM: PORTABLE CHEST 1 VIEW COMPARISON:  07/21/2016 FINDINGS: Endotracheal tube remains in stable position. Prior CABG. Heart is upper limits normal in size. Left base atelectasis. No pneumothorax. No confluent opacity on the right. No effusions. IMPRESSION: Left base atelectasis. Electronically Signed   By: Rolm Baptise M.D.   On: 07/22/2016 07:22   Dg Chest Port 1 View  Result Date: 07/21/2016 CLINICAL DATA:  Initial evaluation for endotracheal tube placement. EXAM: PORTABLE CHEST 1 VIEW COMPARISON:  Prior radiograph from earlier the same day. FINDINGS: Endotracheal tube in place with tip positioned 3.3 cm above the carina. Median sternotomy wires underlying CABG markers and surgical  clips noted, stable. Cardiomegaly unchanged. Elevation of the left hemidiaphragm. Lungs remain largely clear mild diffuse bronchitic changes again noted. No pulmonary edema or pleural effusion. No pneumothorax. Osseous structures unchanged. IMPRESSION: 1. Tip of the endotracheal tube 3.3 cm above the carina. 2. Mild diffuse bronchitic changes. No other active cardiopulmonary disease. Electronically Signed   By: Jeannine Boga M.D.   On: 07/21/2016 22:18   Dg Chest Port 1 View  Result Date: 07/21/2016 CLINICAL DATA:  Intubation during surgery EXAM: PORTABLE CHEST 1 VIEW COMPARISON:  Portable exam 1724 hours compared to 11/26/2012 FINDINGS: Tip of endotracheal tube projects 8.0 cm above carina. Upper normal  heart size post CABG. Mediastinal contours and pulmonary vascularity normal. Mild bronchitic changes without pulmonary infiltrate, pleural effusion or pneumothorax. No acute osseous findings. IMPRESSION: Tip of endotracheal tube projects 8.0 cm above carina. Mild bronchitic changes. Prior CABG. Electronically Signed   By: Lavonia Dana M.D.   On: 07/21/2016 17:32    Summary: 70 yo male smoker with neck pain, hand numbness from cervical spondylosis.  Remained on vent post op.  PMHx of Prostate cancer, CAD, HTN, HLD, GERD, COPD, Gout  Assessment/plan:  Compromised airway. - successfully extubated 12/16  Cervical spondylosis s/p anterior decompression. - post op care per neurosurgery  Hx of CAD, HTN, HLD. - norvasc, lipitor, lopressor - resume ASA when okay with neurosurgery  Hx of COPD with tobacco abuse. - duoneb  Hx of prostate cancer. - flomax  Updated wife at bedside.  PCCM will sign off.  Chesley Mires, MD [facility] Pulmonary/Critical Care 07/23/2016, 9:50 AM Pager:  [phone] After 3pm call: [phone]

## 2016-07-23 NOTE — Discharge Instructions (Signed)

## 2016-07-23 NOTE — Discharge Summary (Signed)
Physician Discharge Summary  [name] MRN: [id number] DOB/AGE: Jan 03, 1946 70 y.o.  Admit date: 07/21/2016 Discharge date: 07/23/2016  Admission Diagnoses:Osteoarthritis with myelopathy C2/3,3/4,4/5,5/6  Discharge Diagnoses:  Active Problems:   Acute respiratory failure (HCC)   Osteoarthritis of cervical spine with myelopathy and radiculopathy   Discharged Condition: good  Hospital Course: [name] was admitted and taken to the operating room for an uncomplicated anterior cervical decompression and arthrodesis at C2/3,3/4,4/5,5/6. I left him intubated overnight secondary to potential swelling and possible difficulty with his airway considering the level of his dissection. He was extubated on post op day 1, and did well. At discharge he is ambulating, voiding, and tolerating a regular diet.His wound is clean, dry, and without signs of infection.   Treatments: surgery: Anterior Cervical decompression C2, 3,4,5,6 Arthrodesis C2/3,3/4,4/5,5/6 with 6,8,9,and 32mm structural allografts Anterior instrumentation(Nuvasive) C3-6, Helix translational plate     Discharge Exam: Blood pressure (!) 138/57, pulse (!) 58, temperature 98.2 F (36.8 C), temperature source Oral, resp. rate 17, height 5\' 6"  (1.676 m), weight 170 lb 10.2 oz (77.4 kg), SpO2 92 %. General appearance: alert, cooperative, appears stated age and no distress Neurologic: Alert and oriented X 3, normal strength and tone. Normal symmetric reflexes. Normal coordination and gait  Disposition: 01-Home or Self Care CERVICAL SPONDYLOSIS WITH MYELOPATHY AND RADICULOPATHY  Allergies as of 07/23/2016      Reactions   No Known Allergies       Medication List    TAKE these medications   acetaminophen 500 MG tablet Commonly known as:  TYLENOL Take 1,000 mg by mouth every 6 (six) hours as needed for moderate pain or headache.   albuterol 108 (90 Base) MCG/ACT inhaler Commonly known as:  PROVENTIL HFA;VENTOLIN  HFA Inhale 2 puffs into the lungs every 6 (six) hours as needed for wheezing or shortness of breath.   allopurinol 100 MG tablet Commonly known as:  ZYLOPRIM TAKE ONE TABLET BY MOUTH ONCE DAILY   amLODipine 5 MG tablet Commonly known as:  NORVASC Take 1 tablet (5 mg total) by mouth daily. What changed:  when to take this   aspirin 81 MG tablet Take 81 mg by mouth at bedtime.   atorvastatin 80 MG tablet Commonly known as:  LIPITOR Take 1 tablet (80 mg total) by mouth daily. What changed:  when to take this   colchicine 0.6 MG tablet Take 1 tablet (0.6 mg total) by mouth daily.   gabapentin 300 MG capsule Commonly known as:  NEURONTIN Take 2 capsules (600 mg total) by mouth 3 (three) times daily.   HYDROcodone-acetaminophen 5-325 MG tablet Commonly known as:  NORCO/VICODIN Take 1 tablet by mouth every 6 (six) hours as needed for moderate pain.   metoprolol tartrate 25 MG tablet Commonly known as:  LOPRESSOR Take 0.5 tablets (12.5 mg total) by mouth 2 (two) times daily.   omega-3 acid ethyl esters 1 g capsule Commonly known as:  LOVAZA TAKE ONE CAPSULE BY MOUTH TWICE DAILY (NEEDS APPOINTMENT FOR REFILLS)   omeprazole 20 MG capsule Commonly known as:  PRILOSEC Take 20 mg by mouth daily.   tamsulosin 0.4 MG Caps capsule Commonly known as:  FLOMAX Take 1 capsule (0.4 mg total) by mouth daily.   tiotropium 18 MCG inhalation capsule Commonly known as:  SPIRIVA Place 1 capsule (18 mcg total) into inhaler and inhale daily.   tiZANidine 4 MG tablet Commonly known as:  ZANAFLEX Take 1 tablet (4 mg total) by mouth every 6 (  six) hours as needed for muscle spasms.      Follow-up Information    Kiernan Farkas L, MD Follow up in 3 week(s).   Specialty:  Neurosurgery Why:  please call the office to make an appointment Contact information: [address] [place] [address] [phone]           Signed: Winfield Cunas 07/23/2016, 12:51 PM

## 2016-07-23 NOTE — Progress Notes (Signed)
Postop day 2. Patient overall doing well. Neck pain minimal. Breathing easily off the ventilator. Patient states that his arms feel improved from preop. Has not ambulated yet.  Patient is afebrile. He is awake and alert. He is oriented and appropriate. Motor examination reveals diffuse weakness of bilateral upper and lower extremities with moderate spasticity. Wound healing well. Airway midline. No evidence of throat swelling. Patient tolerating oral diet.  Progressing well following multilevel anterior cervical surgery. Transfer to floor. Mobilize with therapy. Probable discharge home tomorrow.

## 2016-07-23 NOTE — Evaluation (Signed)
Physical Therapy Evaluation Patient Details Name: [name] MRN: [id number] DOB: May 14, 1946 Today's Date: 07/23/2016   History of Present Illness  Patient is s/p ACDF c2-6  Clinical Impression  Patient seen for evaluation s/p cervical spine surgery with extended intubation. Patient is mobilizing well, ambulated on room air with saturations >93% throughout activity. No instability or over deficits noted. Will defer further UE assessment to OT therapist. Anticipate patient will be safe for d/c home with wife supervision. Acute PT to sign off. Patient in agreement.     Follow Up Recommendations No PT follow up    Equipment Recommendations  None recommended by PT    Recommendations for Other Services       Precautions / Restrictions Precautions Precautions: Cervical Restrictions Weight Bearing Restrictions: No      Mobility  Bed Mobility Overal bed mobility: Modified Independent             General bed mobility comments: increased time, no assist or cues  Transfers Overall transfer level: Independent                  Ambulation/Gait Ambulation/Gait assistance: Supervision Ambulation Distance (Feet): 180 Feet Assistive device: None Gait Pattern/deviations: WFL(Within Functional Limits)     General Gait Details: steady with gait, no deficits noted  Stairs            Wheelchair Mobility    Modified Rankin (Stroke Patients Only)       Balance Overall balance assessment: No apparent balance deficits (not formally assessed)                                           Pertinent Vitals/Pain Pain Assessment: 0-10 Pain Score: 4  Pain Location: left anterior-lateral neck Pain Descriptors / Indicators: Operative site guarding Pain Intervention(s): Monitored during session    Home Living Family/patient expects to be discharged to:: Private residence Living Arrangements: Spouse/significant other Available Help at Discharge:  Family Type of Home: House Home Access: Stairs to enter Entrance Stairs-Rails: None Entrance Stairs-Number of Steps: 2 Home Layout: One level Home Equipment: Environmental consultant - 2 wheels;Bedside commode      Prior Function Level of Independence: Independent               Hand Dominance   Dominant Hand: Right    Extremity/Trunk Assessment   Upper Extremity Assessment Upper Extremity Assessment: Defer to OT evaluation    Lower Extremity Assessment Lower Extremity Assessment: Overall WFL for tasks assessed       Communication   Communication: No difficulties  Cognition Arousal/Alertness: Awake/alert Behavior During Therapy: WFL for tasks assessed/performed Overall Cognitive Status: Within Functional Limits for tasks assessed                      General Comments      Exercises     Assessment/Plan    PT Assessment Patent does not need any further PT services  PT Problem List            PT Treatment Interventions      PT Goals (Current goals can be found in the Care Plan section)  Acute Rehab PT Goals PT Goal Formulation: All assessment and education complete, DC therapy    Frequency     Barriers to discharge        Co-evaluation  End of Session Equipment Utilized During Treatment: Gait belt Activity Tolerance: Patient tolerated treatment well Patient left: in chair;with call bell/phone within reach;with family/visitor present Nurse Communication: Mobility status         Time: [id number] PT Time Calculation (min) (ACUTE ONLY): 21 min   Charges:   PT Evaluation $PT Eval Moderate Complexity: 1 Procedure     PT G Codes:        Duncan Dull 04-Aug-2016, 9:54 AM Alben Deeds, PT DPT  [phone]

## 2016-07-24 ENCOUNTER — Encounter (HOSPITAL_COMMUNITY): Payer: Self-pay | Admitting: Neurosurgery

## 2016-07-24 NOTE — Anesthesia Postprocedure Evaluation (Signed)
Anesthesia Post Note  Patient: [name]  Procedure(s) Performed: Procedure(s) (LRB): ANTERIOR CERVICAL DECOMPRESSION/DISCECTOMY FUSION CERVICAL TWO-THREE, CERVICAL THREE-FOUR,. CERVICAL FOUR-FIVE, CERVICAL  FIVE-SIX (N/A)  Respiratory status: patient on ventilator - see flowsheet for VS and patient remains intubated per anesthesia plan (Dr. Christella Noa requested pt remain intubated post op) Cardiovascular status: stable Postop Assessment: no signs of nausea or vomiting Anesthetic complications: no Comments: Pt extubated POD 1, (remained intubated as per Dr. Christella Noa for length of neck surgery/avoidance of post op airway difficulty).  He discharged POD 2 with no apparent anesthetic complications by chart review.  Ralph Seashore, MD.    Last Vitals:  Vitals:   07/23/16 1300 07/23/16 1400  BP: (!) 130/58 139/68  Pulse: 60 61  Resp: 19 19  Temp:      Last Pain:  Vitals:   07/23/16 1200  TempSrc: Oral  PainSc:                  [name],[name]

## 2016-08-15 DIAGNOSIS — M4712 Other spondylosis with myelopathy, cervical region: Secondary | ICD-10-CM | POA: Diagnosis not present

## 2016-09-09 ENCOUNTER — Other Ambulatory Visit: Payer: Self-pay | Admitting: Pediatrics

## 2016-09-09 DIAGNOSIS — M109 Gout, unspecified: Secondary | ICD-10-CM

## 2016-10-26 ENCOUNTER — Ambulatory Visit (INDEPENDENT_AMBULATORY_CARE_PROVIDER_SITE_OTHER): Payer: Medicare Other | Admitting: Pediatrics

## 2016-10-26 ENCOUNTER — Other Ambulatory Visit: Payer: Self-pay | Admitting: Pediatrics

## 2016-10-26 ENCOUNTER — Encounter: Payer: Self-pay | Admitting: Pediatrics

## 2016-10-26 VITALS — BP 147/80 | HR 56 | Temp 97.6°F | Ht 66.0 in | Wt 166.2 lb

## 2016-10-26 DIAGNOSIS — I2510 Atherosclerotic heart disease of native coronary artery without angina pectoris: Secondary | ICD-10-CM | POA: Diagnosis not present

## 2016-10-26 DIAGNOSIS — F17200 Nicotine dependence, unspecified, uncomplicated: Secondary | ICD-10-CM | POA: Diagnosis not present

## 2016-10-26 DIAGNOSIS — I10 Essential (primary) hypertension: Secondary | ICD-10-CM | POA: Diagnosis not present

## 2016-10-26 DIAGNOSIS — H6981 Other specified disorders of Eustachian tube, right ear: Secondary | ICD-10-CM

## 2016-10-26 MED ORDER — NITROGLYCERIN 0.4 MG SL SUBL: 0.4000 mg | SUBLINGUAL_TABLET | SUBLINGUAL | 3 refills | Status: DC | PRN

## 2016-10-26 MED ORDER — FLUTICASONE PROPIONATE 50 MCG/ACT NA SUSP: 2.0000 | Freq: Every day | NASAL | 6 refills | Status: DC

## 2016-10-26 NOTE — Telephone Encounter (Signed)
What is the name of the medication? Flonase  Have you contacted your pharmacy to request a refill? NO  Which pharmacy would you like this sent to? [facility]   Patient notified that their request is being sent to the clinical staff for review and that they should receive a call once it is complete. If they do not receive a call within 24 hours they can check with their pharmacy or our office.

## 2016-10-26 NOTE — Progress Notes (Signed)
  Subjective:   Patient ID: GYAN CAMBRE, male    DOB: [birth year], 71 y.o.   MRN: [id number] CC: Trouble Hearing (Right Ear)  HPI: DEVAN DANZER is a 71 y.o. male presenting for Trouble Hearing (Right Ear)  Two days ago woke up and had decreased hearing R side When listening to radio hears screeching instead of radio Mostly now noticing he isnt hearing much of anything Some runny nose, otherwise no congestion, no sore throat or coughing, feeling well  HTN: usually 130/80 at home No chest pain, SOB  CAD: not recently needed nitroglycerin, does not have any left, wants a refill  Continues to smoke, thinks he is "slacking off on it"  Relevant past medical, surgical, family and social history reviewed. Allergies and medications reviewed and updated. History  Smoking Status  . Current Every Day Smoker  . Packs/day: 0.25  . Years: 50.00  . Types: Cigarettes, E-cigarettes  . Start date: 03/08/1970  Smokeless Tobacco  . Current User  . Types: Snuff, Chew  . Last attempt to quit: 03/08/2012   ROS: Per HPI   Objective:    BP (!) 147/80   Pulse (!) 56   Temp 97.6 F (36.4 C) (Oral)   Ht 5\' 6"  (1.676 m)   Wt 166 lb 3.2 oz (75.4 kg)   BMI 26.83 kg/m   Wt Readings from Last 3 Encounters:  10/26/16 166 lb 3.2 oz (75.4 kg)  07/22/16 170 lb 10.2 oz (77.4 kg)  07/19/16 165 lb 3.2 oz (74.9 kg)    Gen: NAD, alert, cooperative with exam, NCAT EYES: EOMI, no conjunctival injection, or no icterus ENT:  R TM injected, dull, clear fluid behind it, OP without erythema LYMPH: no cervical LAD CV: NRRR, normal S1/S2, no murmur, distal pulses 2+ b/l Resp: CTABL, no wheezes, normal WOB Ext: No edema, warm Neuro: Alert and oriented, strength equal b/l UE and LE, coordination grossly normal MSK: normal muscle bulk  Assessment & Plan:  Mckyle was seen today for trouble hearing.  Diagnoses and all orders for this visit:  Dysfunction of right eustachian tube flonase daily If not improving  will refer to ENT  TOBACCO USER Cont to encourage cesation  Essential hypertension Slightly elevated today, lower at home Cont to follow at home, cont current meds  Coronary artery disease involving native heart without angina pectoris, unspecified vessel or lesion type Asymptomatic, below prn -     nitroGLYCERIN (NITROSTAT) 0.4 MG SL tablet; Place 1 tablet (0.4 mg total) under the tongue every 5 (five) minutes as needed for chest pain.   Follow up plan: 3 mo Assunta Found, MD [place]

## 2016-10-26 NOTE — Telephone Encounter (Signed)
Flonase sent to pharmacy per today's OV note. LMOVM that Rx was sent in

## 2016-10-26 NOTE — Patient Instructions (Signed)
flonase two sprays each nostril every day Let me know if not improving over next 1-2 weeks

## 2016-11-02 ENCOUNTER — Other Ambulatory Visit: Payer: Self-pay | Admitting: Pediatrics

## 2016-11-02 DIAGNOSIS — Z8739 Personal history of other diseases of the musculoskeletal system and connective tissue: Secondary | ICD-10-CM

## 2016-12-04 ENCOUNTER — Other Ambulatory Visit: Payer: Self-pay | Admitting: Pediatrics

## 2016-12-04 DIAGNOSIS — G629 Polyneuropathy, unspecified: Secondary | ICD-10-CM

## 2017-01-05 ENCOUNTER — Other Ambulatory Visit: Payer: Self-pay | Admitting: Family Medicine

## 2017-01-05 DIAGNOSIS — J441 Chronic obstructive pulmonary disease with (acute) exacerbation: Secondary | ICD-10-CM

## 2017-01-10 ENCOUNTER — Encounter: Payer: Self-pay | Admitting: Physician Assistant

## 2017-01-10 ENCOUNTER — Ambulatory Visit (INDEPENDENT_AMBULATORY_CARE_PROVIDER_SITE_OTHER): Payer: Medicare Other | Admitting: Physician Assistant

## 2017-01-10 ENCOUNTER — Telehealth: Payer: Self-pay | Admitting: Pediatrics

## 2017-01-10 VITALS — BP 137/83 | HR 63 | Temp 97.9°F | Ht 66.0 in | Wt 158.0 lb

## 2017-01-10 DIAGNOSIS — J449 Chronic obstructive pulmonary disease, unspecified: Secondary | ICD-10-CM

## 2017-01-10 DIAGNOSIS — S20212A Contusion of left front wall of thorax, initial encounter: Secondary | ICD-10-CM | POA: Diagnosis not present

## 2017-01-10 DIAGNOSIS — J40 Bronchitis, not specified as acute or chronic: Secondary | ICD-10-CM | POA: Diagnosis not present

## 2017-01-10 MED ORDER — BENZONATATE 200 MG PO CAPS: 200.0000 mg | ORAL_CAPSULE | Freq: Three times a day (TID) | ORAL | 0 refills | Status: DC | PRN

## 2017-01-10 MED ORDER — AZITHROMYCIN 250 MG PO TABS: ORAL_TABLET | ORAL | 1 refills | Status: DC

## 2017-01-10 MED ORDER — PREDNISONE 10 MG (21) PO TBPK: ORAL_TABLET | ORAL | 0 refills | Status: DC

## 2017-01-10 MED ORDER — BUDESONIDE-FORMOTEROL FUMARATE 160-4.5 MCG/ACT IN AERO: 2.0000 | INHALATION_SPRAY | Freq: Two times a day (BID) | RESPIRATORY_TRACT | 3 refills | Status: DC

## 2017-01-10 MED ORDER — HYDROCODONE-ACETAMINOPHEN 10-325 MG PO TABS: 1.0000 | ORAL_TABLET | Freq: Three times a day (TID) | ORAL | 0 refills | Status: DC | PRN

## 2017-01-10 MED ORDER — HYDROCODONE-HOMATROPINE 5-1.5 MG/5ML PO SYRP: 5.0000 mL | ORAL_SOLUTION | Freq: Four times a day (QID) | ORAL | 0 refills | Status: DC | PRN

## 2017-01-10 NOTE — Telephone Encounter (Signed)
Rx called into [facility]  Pt aware

## 2017-01-10 NOTE — Telephone Encounter (Signed)
Pt ins doesn't cover Hycodan it costs $74 Can something else be called into [facility]

## 2017-01-10 NOTE — Patient Instructions (Signed)

## 2017-01-10 NOTE — Telephone Encounter (Signed)
callin tessalon perles 200 mg 1 TID for cough #30

## 2017-01-15 NOTE — Progress Notes (Signed)
BP 137/83   Pulse 63   Temp 97.9 F (36.6 C) (Oral)   Ht 5\' 6"  (1.676 m)   Wt 158 lb (71.7 kg)   BMI 25.50 kg/m    Subjective:    [name], male    DOB: Feb 06, 1946, 71 y.o.   MRN: [id number]  HPI: [name] is a 71 y.o. male presenting on 01/10/2017 for [employer] accident (hurt left rib ) and Cough  This patient comes in for an acute visit concerning his left anterior lateral ribs. He was working with a Therapist, art and it turned on him and he ended up hitting his ribs. He has had pain with breathing. He is trying to breathe through it. He denies any fever or chills. The patient does have COPD and is having an acute bronchitis exacerbation at this time. His cough is quite productive and the dyspnea is increasing.  Relevant past medical, surgical, family and social history reviewed and updated as indicated. Allergies and medications reviewed and updated.  Past Medical History:  Diagnosis Date  . Anginal pain ([place])   . CAD (coronary artery disease)    a. s/p INF MI 1997;  b. s/p CABG 2001;  c. [place] 11/2009:  3v CAD, S-PDA ok with 40% mid, S-OM ok, S-Dx ok, L-LAD ok, EF 50%;  d.  [facility] 5/14:  Inferolateral scar, EF 46%, no ischemia  . COPD (chronic obstructive pulmonary disease) ([place])   . Dyspnea   . ETOH abuse   . GERD (gastroesophageal reflux disease)   . Hypercholesterolemia   . Hypertension   . Myocardial infarction ([place])   . Prostate cancer ([place])   . Tobacco abuse     Past Surgical History:  Procedure Laterality Date  . ANTERIOR CERVICAL DECOMPRESSION/DISCECTOMY FUSION 4 LEVELS N/A 07/21/2016   Procedure: ANTERIOR CERVICAL DECOMPRESSION/DISCECTOMY FUSION CERVICAL TWO-THREE, CERVICAL THREE-FOUR,. CERVICAL FOUR-FIVE, CERVICAL  FIVE-SIX;  Surgeon: Ashok Pall, MD;  Location: [facility];  Service: Neurosurgery;  Laterality: N/A;  . BACK SURGERY    . CARDIAC CATHETERIZATION  2011  . CARDIAC SURGERY     [facility]  . COLONOSCOPY     in remote past, Dr.  Sharlett Iles. Obtaining records.   . CORONARY ANGIOPLASTY    . CORONARY ARTERY BYPASS GRAFT    . ESOPHAGOGASTRODUODENOSCOPY  05/27/2012   AXK:PVVZSM esophagus, stomach and duodenum s/p dilator  . PROSTATE SURGERY      Review of Systems  Constitutional: Positive for fatigue. Negative for appetite change and fever.  HENT: Positive for sore throat. Negative for sinus pressure.   Eyes: Negative.  Negative for pain and visual disturbance.  Respiratory: Positive for cough, shortness of breath and wheezing. Negative for chest tightness.   Cardiovascular: Positive for chest pain. Negative for palpitations and leg swelling.  Gastrointestinal: Negative.  Negative for abdominal pain, diarrhea, nausea and vomiting.  Endocrine: Negative.   Genitourinary: Negative.   Musculoskeletal: Positive for back pain. Negative for myalgias.  Skin: Negative.  Negative for color change and rash.  Neurological: Negative for weakness, numbness and headaches.  Psychiatric/Behavioral: Negative.     Allergies as of 01/10/2017      Reactions   No Known Allergies       Medication List       Accurate as of 01/10/17 11:59 PM. Always use your most recent med list.          acetaminophen 500 MG tablet Commonly known as:  TYLENOL Take 1,000 mg by mouth  every 6 (six) hours as needed for moderate pain or headache.   allopurinol 100 MG tablet Commonly known as:  ZYLOPRIM TAKE ONE TABLET BY MOUTH ONCE DAILY   amLODipine 5 MG tablet Commonly known as:  NORVASC Take 1 tablet (5 mg total) by mouth daily.   aspirin 81 MG tablet Take 81 mg by mouth at bedtime.   atorvastatin 80 MG tablet Commonly known as:  LIPITOR Take 1 tablet (80 mg total) by mouth daily.   azithromycin 250 MG tablet Commonly known as:  ZITHROMAX Z-PAK Take as directed   benzonatate 200 MG capsule Commonly known as:  TESSALON Take 1 capsule (200 mg total) by mouth 3 (three) times daily as needed for cough.   budesonide-formoterol 160-4.5  MCG/ACT inhaler Commonly known as:  SYMBICORT Inhale 2 puffs into the lungs 2 (two) times daily. SAMPLE GIVEN   colchicine 0.6 MG tablet TAKE ONE TABLET BY MOUTH ONCE DAILY   fluticasone 50 MCG/ACT nasal spray Commonly known as:  FLONASE Place 2 sprays into both nostrils daily.   gabapentin 300 MG capsule Commonly known as:  NEURONTIN TAKE TWO CAPSULES BY MOUTH THREE TIMES DAILY   HYDROcodone-acetaminophen 5-325 MG tablet Commonly known as:  NORCO/VICODIN Take 1 tablet by mouth every 6 (six) hours as needed for moderate pain.   HYDROcodone-acetaminophen 10-325 MG tablet Commonly known as:  NORCO Take 1 tablet by mouth every 8 (eight) hours as needed.   HYDROcodone-homatropine 5-1.5 MG/5ML syrup Commonly known as:  HYCODAN Take 5-10 mLs by mouth every 6 (six) hours as needed.   metoprolol tartrate 25 MG tablet Commonly known as:  LOPRESSOR Take 0.5 tablets (12.5 mg total) by mouth 2 (two) times daily.   nitroGLYCERIN 0.4 MG SL tablet Commonly known as:  NITROSTAT Place 1 tablet (0.4 mg total) under the tongue every 5 (five) minutes as needed for chest pain.   omega-3 acid ethyl esters 1 g capsule Commonly known as:  LOVAZA TAKE ONE CAPSULE BY MOUTH TWICE DAILY (NEEDS APPOINTMENT FOR REFILLS)   omeprazole 20 MG capsule Commonly known as:  PRILOSEC Take 20 mg by mouth daily.   predniSONE 10 MG (21) Tbpk tablet Commonly known as:  STERAPRED UNI-PAK 21 TAB Take as directed   tamsulosin 0.4 MG Caps capsule Commonly known as:  FLOMAX Take 1 capsule (0.4 mg total) by mouth daily.   tiotropium 18 MCG inhalation capsule Commonly known as:  SPIRIVA Place 1 capsule (18 mcg total) into inhaler and inhale daily.   tiZANidine 4 MG tablet Commonly known as:  ZANAFLEX Take 1 tablet (4 mg total) by mouth every 6 (six) hours as needed for muscle spasms.   VENTOLIN HFA 108 (90 Base) MCG/ACT inhaler Generic drug:  albuterol INHALE TWO PUFFS INTO THE LUNGS EVERY 6 HOURS AS  NEEDED FOR  WHEEZING  OR  SHORTNESS  OF  BREATH          Objective:    BP 137/83   Pulse 63   Temp 97.9 F (36.6 C) (Oral)   Ht 5\' 6"  (1.676 m)   Wt 158 lb (71.7 kg)   BMI 25.50 kg/m   Allergies  Allergen Reactions  . No Known Allergies     Physical Exam  Constitutional: He appears well-developed and well-nourished.  HENT:  Head: Normocephalic and atraumatic.  Right Ear: Hearing and tympanic membrane normal.  Left Ear: Hearing and tympanic membrane normal.  Nose: Mucosal edema and sinus tenderness present. No nasal deformity. Right sinus exhibits frontal sinus  tenderness. Left sinus exhibits frontal sinus tenderness.  Mouth/Throat: Posterior oropharyngeal erythema present.  Eyes: Conjunctivae and EOM are normal. Pupils are equal, round, and reactive to light. Right eye exhibits no discharge. Left eye exhibits no discharge.  Neck: Normal range of motion. Neck supple.  Cardiovascular: Normal rate, regular rhythm and normal heart sounds.   Pulmonary/Chest: Effort normal. No respiratory distress. He has no decreased breath sounds. He has wheezes. He has no rhonchi. He has no rales. He exhibits tenderness and bony tenderness. He exhibits no deformity and no swelling.    Abdominal: Soft. Bowel sounds are normal.  Musculoskeletal: Normal range of motion.       Arms: Skin: Skin is warm and dry.        Assessment & Plan:   1. Contusion of rib on left side, initial encounter - HYDROcodone-acetaminophen (NORCO) 10-325 MG tablet; Take 1 tablet by mouth every 8 (eight) hours as needed.  Dispense: 30 tablet; Refill: 0  2. Bronchitis - azithromycin (ZITHROMAX Z-PAK) 250 MG tablet; Take as directed  Dispense: 6 each; Refill: 1 - HYDROcodone-homatropine (HYCODAN) 5-1.5 MG/5ML syrup; Take 5-10 mLs by mouth every 6 (six) hours as needed.  Dispense: 240 mL; Refill: 0 - predniSONE (STERAPRED UNI-PAK 21 TAB) 10 MG (21) TBPK tablet; Take as directed  Dispense: 21 tablet; Refill: 0 -  budesonide-formoterol (SYMBICORT) 160-4.5 MCG/ACT inhaler; Inhale 2 puffs into the lungs 2 (two) times daily. SAMPLE GIVEN  Dispense: 1 Inhaler; Refill: 3  3. Chronic obstructive pulmonary disease, unspecified COPD type ([place])   Current Outpatient Prescriptions:  .  acetaminophen (TYLENOL) 500 MG tablet, Take 1,000 mg by mouth every 6 (six) hours as needed for moderate pain or headache., Disp: , Rfl:  .  allopurinol (ZYLOPRIM) 100 MG tablet, TAKE ONE TABLET BY MOUTH ONCE DAILY, Disp: 90 tablet, Rfl: 0 .  amLODipine (NORVASC) 5 MG tablet, Take 1 tablet (5 mg total) by mouth daily. (Patient taking differently: Take 5 mg by mouth at bedtime. ), Disp: 90 tablet, Rfl: 6 .  aspirin 81 MG tablet, Take 81 mg by mouth at bedtime. , Disp: , Rfl:  .  atorvastatin (LIPITOR) 80 MG tablet, Take 1 tablet (80 mg total) by mouth daily. (Patient taking differently: Take 80 mg by mouth every evening. ), Disp: 90 tablet, Rfl: 6 .  colchicine 0.6 MG tablet, TAKE ONE TABLET BY MOUTH ONCE DAILY, Disp: 30 tablet, Rfl: 4 .  fluticasone (FLONASE) 50 MCG/ACT nasal spray, Place 2 sprays into both nostrils daily., Disp: 16 g, Rfl: 6 .  gabapentin (NEURONTIN) 300 MG capsule, TAKE TWO CAPSULES BY MOUTH THREE TIMES DAILY, Disp: 180 capsule, Rfl: 3 .  metoprolol tartrate (LOPRESSOR) 25 MG tablet, Take 0.5 tablets (12.5 mg total) by mouth 2 (two) times daily., Disp: 90 tablet, Rfl: 3 .  nitroGLYCERIN (NITROSTAT) 0.4 MG SL tablet, Place 1 tablet (0.4 mg total) under the tongue every 5 (five) minutes as needed for chest pain., Disp: 20 tablet, Rfl: 3 .  omega-3 acid ethyl esters (LOVAZA) 1 g capsule, TAKE ONE CAPSULE BY MOUTH TWICE DAILY (NEEDS APPOINTMENT FOR REFILLS), Disp: 180 capsule, Rfl: 3 .  omeprazole (PRILOSEC) 20 MG capsule, Take 20 mg by mouth daily., Disp: , Rfl:  .  tamsulosin (FLOMAX) 0.4 MG CAPS capsule, Take 1 capsule (0.4 mg total) by mouth daily., Disp: 90 capsule, Rfl: 1 .  tiotropium (SPIRIVA) 18 MCG inhalation  capsule, Place 1 capsule (18 mcg total) into inhaler and inhale daily., Disp: 30 capsule, Rfl:  12 .  VENTOLIN HFA 108 (90 Base) MCG/ACT inhaler, INHALE TWO PUFFS INTO THE LUNGS EVERY 6 HOURS AS NEEDED FOR  WHEEZING  OR  SHORTNESS  OF  BREATH, Disp: 9 each, Rfl: 2 .  azithromycin (ZITHROMAX Z-PAK) 250 MG tablet, Take as directed, Disp: 6 each, Rfl: 1 .  benzonatate (TESSALON) 200 MG capsule, Take 1 capsule (200 mg total) by mouth 3 (three) times daily as needed for cough., Disp: 30 capsule, Rfl: 0 .  budesonide-formoterol (SYMBICORT) 160-4.5 MCG/ACT inhaler, Inhale 2 puffs into the lungs 2 (two) times daily. SAMPLE GIVEN, Disp: 1 Inhaler, Rfl: 3 .  HYDROcodone-acetaminophen (NORCO) 10-325 MG tablet, Take 1 tablet by mouth every 8 (eight) hours as needed., Disp: 30 tablet, Rfl: 0 .  HYDROcodone-acetaminophen (NORCO/VICODIN) 5-325 MG tablet, Take 1 tablet by mouth every 6 (six) hours as needed for moderate pain. (Patient not taking: Reported on 01/10/2017), Disp: 70 tablet, Rfl: 0 .  HYDROcodone-homatropine (HYCODAN) 5-1.5 MG/5ML syrup, Take 5-10 mLs by mouth every 6 (six) hours as needed., Disp: 240 mL, Rfl: 0 .  predniSONE (STERAPRED UNI-PAK 21 TAB) 10 MG (21) TBPK tablet, Take as directed, Disp: 21 tablet, Rfl: 0 .  tiZANidine (ZANAFLEX) 4 MG tablet, Take 1 tablet (4 mg total) by mouth every 6 (six) hours as needed for muscle spasms. (Patient not taking: Reported on 01/10/2017), Disp: 60 tablet, Rfl: 0  Continue all other maintenance medications as listed above.  Follow up plan: Return if symptoms worsen or fail to improve.  Educational handout given for bronchitis  Terald Sleeper PA-C [place] [address]  [place], [place] [address] [phone]   01/15/2017, 8:14 AM

## 2017-01-17 ENCOUNTER — Encounter: Payer: Self-pay | Admitting: Physician Assistant

## 2017-01-17 ENCOUNTER — Ambulatory Visit (INDEPENDENT_AMBULATORY_CARE_PROVIDER_SITE_OTHER): Payer: Medicare Other | Admitting: Physician Assistant

## 2017-01-17 ENCOUNTER — Ambulatory Visit (INDEPENDENT_AMBULATORY_CARE_PROVIDER_SITE_OTHER): Payer: Medicare Other

## 2017-01-17 VITALS — BP 120/68 | HR 63 | Temp 98.6°F | Ht 66.0 in | Wt 158.0 lb

## 2017-01-17 DIAGNOSIS — R0789 Other chest pain: Secondary | ICD-10-CM

## 2017-01-17 DIAGNOSIS — J40 Bronchitis, not specified as acute or chronic: Secondary | ICD-10-CM | POA: Diagnosis not present

## 2017-01-17 DIAGNOSIS — R0781 Pleurodynia: Secondary | ICD-10-CM

## 2017-01-17 MED ORDER — AMOXICILLIN-POT CLAVULANATE 875-125 MG PO TABS: 1.0000 | ORAL_TABLET | Freq: Two times a day (BID) | ORAL | 0 refills | Status: DC

## 2017-01-17 MED ORDER — PREDNISONE 10 MG (21) PO TBPK: ORAL_TABLET | ORAL | 0 refills | Status: DC

## 2017-01-17 NOTE — Progress Notes (Signed)
Subjective:     [name], male   DOB: Sep 24, 1945, 71 y.o.   MRN: [id number]  HPI Pt seen last week for bronchitis He was started on Zpack and 6 days Sterapred dose pack Pt unable to afford meds for cough He has taken medicine and tolerated well Still with some rib pain Wife concerned about pneumonia  Review of Systems  Constitutional: Positive for activity change and fatigue. Negative for appetite change, chills and fever.  HENT: Positive for congestion. Negative for postnasal drip, rhinorrhea, sinus pain and sinus pressure.   Respiratory: Positive for cough. Negative for shortness of breath and wheezing.   Cardiovascular: Negative.        Objective:   Physical Exam  Constitutional: He appears well-developed and well-nourished.  HENT:  Mouth/Throat: Oropharynx is clear and moist. No oropharyngeal exudate.  Neck: Neck supple.  Cardiovascular: Normal rate, regular rhythm and normal heart sounds.   Pulmonary/Chest: Effort normal. He has wheezes.  +Rhonchi  Lymphadenopathy:    He has no cervical adenopathy.  Nursing note and vitals reviewed. CXR- see labs     Assessment:     1. Bronchitis   2. Rib pain        Plan:     Switch to Augmentin 875mg  bid x 10 days Continue with inhaler use Rf 6 day Prednisone pack Fluids Rest Deep breathing exercise Nl course reviewed F/U prn

## 2017-01-17 NOTE — Patient Instructions (Signed)

## 2017-01-19 ENCOUNTER — Ambulatory Visit (INDEPENDENT_AMBULATORY_CARE_PROVIDER_SITE_OTHER): Payer: Medicare Other | Admitting: *Deleted

## 2017-01-19 VITALS — BP 134/80 | HR 71 | Ht 66.0 in | Wt 153.0 lb

## 2017-01-19 DIAGNOSIS — Z0000 Encounter for general adult medical examination without abnormal findings: Secondary | ICD-10-CM

## 2017-01-19 DIAGNOSIS — Z1211 Encounter for screening for malignant neoplasm of colon: Secondary | ICD-10-CM

## 2017-01-19 DIAGNOSIS — N393 Stress incontinence (female) (male): Secondary | ICD-10-CM

## 2017-01-19 DIAGNOSIS — Z8546 Personal history of malignant neoplasm of prostate: Secondary | ICD-10-CM

## 2017-01-19 DIAGNOSIS — Z23 Encounter for immunization: Secondary | ICD-10-CM | POA: Diagnosis not present

## 2017-01-19 NOTE — Patient Instructions (Signed)
Mr. Burkemper , Thank you for taking time to come for your Medicare Wellness Visit. I appreciate your ongoing commitment to your health goals. Please review the following plan we discussed and let me know if I can assist you in the future.   These are the goals we discussed: Goals    . Quit smoking / using tobacco      Complete Advance Directives and bring a signed/notarized copy to our office. If you haven't heard from your referral appointments by next week, contact our referral office at [phone].   This is a list of the screening recommended for you and due dates:  Health Maintenance  Topic Date Due  . Tetanus Vaccine  08/20/1966  . Pneumonia vaccines (2 of 2 - PCV13) 11/27/2013  . Colon Cancer Screening  11/18/2016  . Flu Shot  03/07/2017  .  Hepatitis C: One time screening is recommended by Center for Disease Control  (CDC) for  adults born from [birth year] through 1963.   Completed   Pneumococcal Conjugate Vaccine suspension for injection What is this medicine? PNEUMOCOCCAL VACCINE (NEU mo KOK al vak SEEN) is a vaccine used to prevent pneumococcus bacterial infections. These bacteria can cause serious infections like pneumonia, meningitis, and blood infections. This vaccine will lower your chance of getting pneumonia. If you do get pneumonia, it can make your symptoms milder and your illness shorter. This vaccine will not treat an infection and will not cause infection. This vaccine is recommended for infants and young children, adults with certain medical conditions, and adults 74 years or older. This medicine may be used for other purposes; ask your health care provider or pharmacist if you have questions. COMMON BRAND NAME(S): Prevnar, Prevnar 13 What should I tell my health care provider before I take this medicine? They need to know if you have any of these conditions: -bleeding problems -fever -immune system problems -an unusual or allergic reaction to pneumococcal vaccine,  diphtheria toxoid, other vaccines, latex, other medicines, foods, dyes, or preservatives -pregnant or trying to get pregnant -breast-feeding How should I use this medicine? This vaccine is for injection into a muscle. It is given by a health care professional. A copy of Vaccine Information Statements will be given before each vaccination. Read this sheet carefully each time. The sheet may change frequently. Talk to your pediatrician regarding the use of this medicine in children. While this drug may be prescribed for children as young as 28 weeks old for selected conditions, precautions do apply. Overdosage: If you think you have taken too much of this medicine contact a poison control center or emergency room at once. NOTE: This medicine is only for you. Do not share this medicine with others. What if I miss a dose? It is important not to miss your dose. Call your doctor or health care professional if you are unable to keep an appointment. What may interact with this medicine? -medicines for cancer chemotherapy -medicines that suppress your immune function -steroid medicines like prednisone or cortisone This list may not describe all possible interactions. Give your health care provider a list of all the medicines, herbs, non-prescription drugs, or dietary supplements you use. Also tell them if you smoke, drink alcohol, or use illegal drugs. Some items may interact with your medicine. What should I watch for while using this medicine? Mild fever and pain should go away in 3 days or less. Report any unusual symptoms to your doctor or health care professional. What side effects may I notice  from receiving this medicine? Side effects that you should report to your doctor or health care professional as soon as possible: -allergic reactions like skin rash, itching or hives, swelling of the face, lips, or tongue -breathing problems -confused -fast or irregular heartbeat -fever over 102 degrees  F -seizures -unusual bleeding or bruising -unusual muscle weakness Side effects that usually do not require medical attention (report to your doctor or health care professional if they continue or are bothersome): -aches and pains -diarrhea -fever of 102 degrees F or less -headache -irritable -loss of appetite -pain, tender at site where injected -trouble sleeping This list may not describe all possible side effects. Call your doctor for medical advice about side effects. You may report side effects to FDA at 1-800-FDA-1088. Where should I keep my medicine? This does not apply. This vaccine is given in a clinic, pharmacy, doctor's office, or other health care setting and will not be stored at home. NOTE: This sheet is a summary. It may not cover all possible information. If you have questions about this medicine, talk to your doctor, pharmacist, or health care provider.  2018 Elsevier/Gold Standard (2014-04-30 10:27:27)

## 2017-01-22 ENCOUNTER — Telehealth: Payer: Self-pay | Admitting: Pediatrics

## 2017-01-22 DIAGNOSIS — H2512 Age-related nuclear cataract, left eye: Secondary | ICD-10-CM | POA: Diagnosis not present

## 2017-01-22 DIAGNOSIS — H25811 Combined forms of age-related cataract, right eye: Secondary | ICD-10-CM | POA: Diagnosis not present

## 2017-01-22 DIAGNOSIS — H269 Unspecified cataract: Secondary | ICD-10-CM | POA: Diagnosis not present

## 2017-01-22 NOTE — Addendum Note (Signed)
Addended by: Ilean China on: 01/22/2017 11:39 AM   Modules accepted: Level of Service

## 2017-01-22 NOTE — Telephone Encounter (Signed)
Pt notified will need to be seen for pain med Pt will call back to schedule

## 2017-01-22 NOTE — Telephone Encounter (Signed)
What symptoms do you have?rib pain has bronchitis and hurts when he coughs  How long have you been sick? Since last week his lawn mower turned on him and he broke his ribs  Have you been seen for this problem?yes last week by Osa Craver  If your provider decides to give you a prescription, which pharmacy would you like for it to be sent to? Pt wants something for pain [facility]   Patient informed that this information will be sent to the clinical staff for review and that they should receive a follow up call.

## 2017-01-22 NOTE — Progress Notes (Signed)
Subjective:   [name] is a 71 y.o. male who presents for an Initial Medicare Annual Wellness Visit.mr [name] is accompanied by his wife. He lives at home with his wife. He has a son, a daughter, and 3 grandchildren. He is retired from the [employer]. 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   Review of Systems  Reports that his health is about the same as last year.   Cardiac Risk Factors include: advanced age (>78men, >45 women);dyslipidemia;male gender;hypertension;sedentary lifestyle;smoking/ tobacco exposure   Urinary: some stress incontinence  Other systems negative.    Objective:    Today's Vitals   01/19/17 1530  BP: 134/80  Pulse: 71  Weight: 153 lb (69.4 kg)  Height: 5\' 6"  (1.676 m)   Body mass index is 24.69 kg/m.  Current Medications (verified) Outpatient Encounter Prescriptions as of 01/19/2017  Medication Sig  . acetaminophen (TYLENOL) 500 MG tablet Take 1,000 mg by mouth every 6 (six) hours as needed for moderate pain or headache.  . allopurinol (ZYLOPRIM) 100 MG tablet TAKE ONE TABLET BY MOUTH ONCE DAILY  . amLODipine (NORVASC) 5 MG tablet Take 1 tablet (5 mg total) by mouth daily. (Patient taking differently: Take 5 mg by mouth at bedtime. )  . amoxicillin-clavulanate (AUGMENTIN) 875-125 MG tablet Take 1 tablet by mouth 2 (two) times daily.  Marland Kitchen aspirin 81 MG tablet Take 81 mg by mouth at bedtime.   Marland Kitchen atorvastatin (LIPITOR) 80 MG tablet Take 1 tablet (80 mg total) by mouth daily. (Patient taking differently: Take 80 mg by mouth every evening. )  . budesonide-formoterol (SYMBICORT) 160-4.5 MCG/ACT inhaler Inhale 2 puffs into the lungs 2 (two) times daily. SAMPLE GIVEN  . colchicine 0.6 MG tablet TAKE ONE TABLET BY MOUTH ONCE DAILY  . fluticasone (FLONASE) 50 MCG/ACT nasal spray Place 2 sprays into both nostrils daily.  Marland Kitchen gabapentin (NEURONTIN) 300 MG capsule TAKE TWO CAPSULES BY MOUTH THREE TIMES DAILY  . HYDROcodone-acetaminophen  (NORCO) 10-325 MG tablet Take 1 tablet by mouth every 8 (eight) hours as needed.  . metoprolol tartrate (LOPRESSOR) 25 MG tablet Take 0.5 tablets (12.5 mg total) by mouth 2 (two) times daily.  . nitroGLYCERIN (NITROSTAT) 0.4 MG SL tablet Place 1 tablet (0.4 mg total) under the tongue every 5 (five) minutes as needed for chest pain.  Marland Kitchen omega-3 acid ethyl esters (LOVAZA) 1 g capsule TAKE ONE CAPSULE BY MOUTH TWICE DAILY (NEEDS APPOINTMENT FOR REFILLS)  . omeprazole (PRILOSEC) 20 MG capsule Take 20 mg by mouth daily.  . predniSONE (STERAPRED UNI-PAK 21 TAB) 10 MG (21) TBPK tablet Take as directed  . tamsulosin (FLOMAX) 0.4 MG CAPS capsule Take 1 capsule (0.4 mg total) by mouth daily.  Marland Kitchen tiotropium (SPIRIVA) 18 MCG inhalation capsule Place 1 capsule (18 mcg total) into inhaler and inhale daily.  . VENTOLIN HFA 108 (90 Base) MCG/ACT inhaler INHALE TWO PUFFS INTO THE LUNGS EVERY 6 HOURS AS NEEDED FOR  WHEEZING  OR  SHORTNESS  OF  BREATH  . [DISCONTINUED] HYDROcodone-acetaminophen (NORCO/VICODIN) 5-325 MG tablet Take 1 tablet by mouth every 6 (six) hours as needed for moderate pain.  . [DISCONTINUED] tiZANidine (ZANAFLEX) 4 MG tablet Take 1 tablet (4 mg total) by mouth every 6 (six) hours as needed for muscle spasms.   No facility-administered encounter medications on file as of 01/19/2017.     Allergies (verified) No known allergies   History: Past Medical History:  Diagnosis Date  . Anginal pain (  [place])   . CAD (coronary artery disease)    a. s/p INF MI 1997;  b. s/p CABG 2001;  c. [place] 11/2009:  3v CAD, S-PDA ok with 40% mid, S-OM ok, S-Dx ok, L-LAD ok, EF 50%;  d.  [place] [facility] 5/14:  Inferolateral scar, EF 46%, no ischemia  . COPD (chronic obstructive pulmonary disease) ([place])   . Dyspnea   . ETOH abuse   . GERD (gastroesophageal reflux disease)   . Hypercholesterolemia   . Hypertension   . Myocardial infarction ([place])   . Prostate cancer ([place])   . Tobacco abuse    Past Surgical History:    Procedure Laterality Date  . ANTERIOR CERVICAL DECOMPRESSION/DISCECTOMY FUSION 4 LEVELS N/A 07/21/2016   Procedure: ANTERIOR CERVICAL DECOMPRESSION/DISCECTOMY FUSION CERVICAL TWO-THREE, CERVICAL THREE-FOUR,. CERVICAL FOUR-FIVE, CERVICAL  FIVE-SIX;  Surgeon: Ashok Pall, MD;  Location: [facility];  Service: Neurosurgery;  Laterality: N/A;  . BACK SURGERY    . CARDIAC CATHETERIZATION  2011  . CARDIAC SURGERY     Open Heart  . COLONOSCOPY     in remote past, Dr. Sharlett Iles. Obtaining records.   . CORONARY ANGIOPLASTY    . CORONARY ARTERY BYPASS GRAFT    . ESOPHAGOGASTRODUODENOSCOPY  05/27/2012   [id number] esophagus, stomach and duodenum s/p dilator  . PROSTATE SURGERY     Family History  Problem Relation Age of Onset  . Diabetes Mother   . Hypertension Father   . Heart disease Brother   . Diabetes Brother   . Heart disease Brother   . Diabetes Brother   . Multiple sclerosis Daughter   . Healthy Son   . Colon cancer Neg Hx    Social History   Occupational History  . Not on file.   Social History Main Topics  . Smoking status: Current Every Day Smoker    Packs/day: 0.25    Years: 50.00    Types: Cigarettes, E-cigarettes    Start date: 03/08/1970  . Smokeless tobacco: Current User    Types: Snuff    Last attempt to quit: 03/08/2012  . Alcohol use 0.0 oz/week     Comment: occasionally  . Drug use: No  . Sexual activity: Yes    Partners: Female   Tobacco Counseling Discussed tobacco cessation but patient is not ready to quit.  Activities of Daily Living In your present state of health, do you have any difficulty performing the following activities: 01/19/2017 07/13/2016  Hearing? Y N  Vision? Y N  Difficulty concentrating or making decisions? N N  Walking or climbing stairs? N N  Dressing or bathing? N N  Doing errands, shopping? N N  Preparing Food and eating ? N -  Using the Toilet? N -  In the past six months, have you accidently leaked urine? Y -  Do you have problems  with loss of bowel control? N -  Managing your Medications? N -  Managing your Finances? N -  Housekeeping or managing your Housekeeping? N -  Some recent data might be hidden  Scheduled for cataract removal Some difficulty with hearing but not interested in evaluation at this time Some stress incontinence. History of prostate cancer. Patient of Dr Jeffie Pollock.    Immunizations and Health Maintenance Immunization History  Administered Date(s) Administered  . Influenza,inj,Quad PF,36+ Mos 05/08/2016  . Pneumococcal Conjugate-13 01/19/2017  . Pneumococcal Polysaccharide-23 11/27/2012   Health Maintenance Due  Topic Date Due  . TETANUS/TDAP  08/20/1966  . COLONOSCOPY  11/18/2016    Patient  Care Team: Eustaquio Maize, MD as PCP - General (Internal Medicine) Gala Romney Cristopher Estimable, MD (Gastroenterology) [name] Alphonse Guild, MD as Consulting Physician (Cardiology) Harlen Labs, MD as Referring Physician (Optometry)  No hospitalizations, ER visits, or surgeries within the past year.     Assessment:   This is a routine wellness examination for Branko.   Hearing/Vision screen No hearing or vision deficits noted during visit. Last eye exam was 01/2017.  Dietary issues and exercise activities discussed: Current Exercise Habits: The patient does not participate in regular exercise at present, Exercise limited by: None identified\  Diet: Reports eating 2 meals a day  Goals    . Exercise 150 minutes per week (moderate activity)    . Quit smoking / using tobacco      Depression Screen PHQ 2/9 Scores 01/19/2017 01/17/2017 01/10/2017 10/26/2016  PHQ - 2 Score 0 0 0 0    Fall Risk Fall Risk  01/19/2017 01/10/2017 10/26/2016 07/19/2016 04/17/2016  Falls in the past year? No No No No No    Cognitive Function: MMSE - Mini Mental State Exam 01/19/2017  Not completed: Unable to complete  Orientation to time 5  Unable to complete properly due to inability to read and write effectively       Screening Tests Health Maintenance  Topic Date Due  . TETANUS/TDAP  08/20/1966  . COLONOSCOPY  11/18/2016  . INFLUENZA VACCINE  03/07/2017  . Hepatitis C Screening  Completed  . PNA vac Low Risk Adult  Completed        Plan:  Advanced directives given. Patient will review them with his family and bring a signed/notarized copy back to our office Keep f/u with Dr Evette Doffing on 02/02/17 Referred back t Dr Jeffie Pollock for stress incontinence Referral to [facility] for screening colonoscopy Prevnar given today Check cost of Tdap at next visit   I have personally reviewed and noted the following in the patient's chart:   . Medical and social history . Use of alcohol, tobacco or illicit drugs  . Current medications and supplements . Functional ability and status . Nutritional status . Physical activity . Advanced directives . List of other physicians . Hospitalizations, surgeries, and ER visits in previous 12 months . Vitals . Screenings to include cognitive, depression, and falls . Referrals and appointments  In addition, I have reviewed and discussed with patient certain preventive protocols, quality metrics, and best practice recommendations. A written personalized care plan for preventive services as well as general preventive health recommendations were provided to patient.     Chong Sicilian, RN   01/22/2017    I have reviewed and agree with the above AWV documentation.   Assunta Found, MD [place]

## 2017-01-23 DIAGNOSIS — S0501XA Injury of conjunctiva and corneal abrasion without foreign body, right eye, initial encounter: Secondary | ICD-10-CM | POA: Diagnosis not present

## 2017-01-26 DIAGNOSIS — S0501XD Injury of conjunctiva and corneal abrasion without foreign body, right eye, subsequent encounter: Secondary | ICD-10-CM | POA: Diagnosis not present

## 2017-02-02 ENCOUNTER — Encounter: Payer: Self-pay | Admitting: Pediatrics

## 2017-02-02 ENCOUNTER — Ambulatory Visit (INDEPENDENT_AMBULATORY_CARE_PROVIDER_SITE_OTHER): Payer: Medicare Other | Admitting: Pediatrics

## 2017-02-02 VITALS — BP 139/84 | HR 59 | Temp 97.5°F | Ht 66.0 in | Wt 153.4 lb

## 2017-02-02 DIAGNOSIS — E785 Hyperlipidemia, unspecified: Secondary | ICD-10-CM

## 2017-02-02 DIAGNOSIS — Z8739 Personal history of other diseases of the musculoskeletal system and connective tissue: Secondary | ICD-10-CM

## 2017-02-02 DIAGNOSIS — N400 Enlarged prostate without lower urinary tract symptoms: Secondary | ICD-10-CM

## 2017-02-02 DIAGNOSIS — I10 Essential (primary) hypertension: Secondary | ICD-10-CM | POA: Diagnosis not present

## 2017-02-02 DIAGNOSIS — G629 Polyneuropathy, unspecified: Secondary | ICD-10-CM | POA: Diagnosis not present

## 2017-02-02 DIAGNOSIS — J449 Chronic obstructive pulmonary disease, unspecified: Secondary | ICD-10-CM | POA: Diagnosis not present

## 2017-02-02 MED ORDER — METOPROLOL TARTRATE 25 MG PO TABS: 12.5000 mg | ORAL_TABLET | Freq: Two times a day (BID) | ORAL | 3 refills | Status: DC

## 2017-02-02 MED ORDER — OMEGA-3-ACID ETHYL ESTERS 1 G PO CAPS: ORAL_CAPSULE | ORAL | 0 refills | Status: DC

## 2017-02-02 MED ORDER — ALLOPURINOL 100 MG PO TABS: 100.0000 mg | ORAL_TABLET | Freq: Every day | ORAL | 1 refills | Status: DC

## 2017-02-02 MED ORDER — GABAPENTIN 300 MG PO CAPS: 600.0000 mg | ORAL_CAPSULE | Freq: Three times a day (TID) | ORAL | 5 refills | Status: DC

## 2017-02-02 MED ORDER — ATORVASTATIN CALCIUM 80 MG PO TABS: 80.0000 mg | ORAL_TABLET | Freq: Every day | ORAL | 1 refills | Status: DC

## 2017-02-02 MED ORDER — AMLODIPINE BESYLATE 5 MG PO TABS: 5.0000 mg | ORAL_TABLET | Freq: Every day | ORAL | 1 refills | Status: DC

## 2017-02-02 MED ORDER — TIOTROPIUM BROMIDE MONOHYDRATE 18 MCG IN CAPS: 18.0000 ug | ORAL_CAPSULE | Freq: Every day | RESPIRATORY_TRACT | 3 refills | Status: DC

## 2017-02-02 MED ORDER — TAMSULOSIN HCL 0.4 MG PO CAPS: 0.4000 mg | ORAL_CAPSULE | Freq: Every day | ORAL | 1 refills | Status: DC

## 2017-02-02 NOTE — Patient Instructions (Signed)
Phone: [phone] for [name]

## 2017-02-02 NOTE — Progress Notes (Signed)
  Subjective:   [name], male    DOB: 06-25-1946, 71 y.o.   MRN: [id number] CC: Follow-up med problems  HPI: [name] is a 70 y.o. male presenting for Follow-up  COPD: spirva daily Still coughing some still after recent illness, much better Taking meds regularly, not needing rescue inhaler  HLD: on atorvastatin, no s/e  Gout: no flares in a while  HTN: taking meds regularly No CP, HA or SOB  Tobacco use: smoking, using snuff Not interested in quitting  H/o prostate cancer, BPH: symptoms improved with tamsulosin, has f/u appt with urology  Relevant past medical, surgical, family and social history reviewed. Allergies and medications reviewed and updated. History  Smoking Status  . Current Every Day Smoker  . Packs/day: 0.25  . Years: 50.00  . Types: Cigarettes, E-cigarettes  . Start date: 03/08/1970  Smokeless Tobacco  . Current User  . Types: Snuff  . Last attempt to quit: 03/08/2012   ROS: Per HPI   Objective:    BP 139/84   Pulse (!) 59   Temp 97.5 F (36.4 C) (Oral)   Ht 5\' 6"  (1.676 m)   Wt 153 lb 6.4 oz (69.6 kg)   BMI 24.76 kg/m   Wt Readings from Last 3 Encounters:  02/02/17 153 lb 6.4 oz (69.6 kg)  01/19/17 153 lb (69.4 kg)  01/17/17 158 lb (71.7 kg)    Gen: NAD, alert, cooperative with exam, NCAT EYES: EOMI, no conjunctival injection, or no icterus ENT:  TMs pearly gray b/l, OP without erythema LYMPH: no cervical LAD CV: NRRR, normal S1/S2, no murmur Resp: CTABL, no wheezes, normal WOB Ext: No edema, warm Neuro: Alert and oriented  Assessment & Plan:  [name] was seen today for follow-up multiple med problems.  Diagnoses and all orders for this visit:  Essential hypertension Slightly elevated today Cont to check at home Cont current meds -     metoprolol tartrate (LOPRESSOR) 25 MG tablet; Take 0.5 tablets (12.5 mg total) by mouth 2 (two) times daily. -     amLODipine (NORVASC) 5 MG tablet; Take 1 tablet (5 mg total) by  mouth daily.  H/O: gout Stable, no recent flares, cont below -     allopurinol (ZYLOPRIM) 100 MG tablet; Take 1 tablet (100 mg total) by mouth daily.  Neuropathy Stable, no worsening since surgery Cont below -     gabapentin (NEURONTIN) 300 MG capsule; Take 2 capsules (600 mg total) by mouth 3 (three) times daily.  Benign prostatic hyperplasia without lower urinary tract symptoms Follow up with urology scheduled -     tamsulosin (FLOMAX) 0.4 MG CAPS capsule; Take 1 capsule (0.4 mg total) by mouth daily.  Chronic obstructive pulmonary disease, unspecified COPD type ([place]) Stable, cont meds -     tiotropium (SPIRIVA) 18 MCG inhalation capsule; Place 1 capsule (18 mcg total) into inhaler and inhale daily.  Hyperlipidemia, unspecified hyperlipidemia type Stable, cont meds -     atorvastatin (LIPITOR) 80 MG tablet; Take 1 tablet (80 mg total) by mouth daily at 6 PM. -     omega-3 acid ethyl esters (LOVAZA) 1 g capsule; TAKE ONE CAPSULE BY MOUTH TWICE DAILY (NEEDS APPOINTMENT FOR REFILLS)   Follow up plan: Return in about 6 months (around 08/04/2017). Assunta Found, MD [place]

## 2017-02-13 ENCOUNTER — Telehealth: Payer: Self-pay

## 2017-02-13 NOTE — Telephone Encounter (Signed)
Pt's wife [name]) called to set up colonoscopy. He isn't having any GI issues, no blood thinners or hx of heart attacks in past 12 months. Please call [phone]

## 2017-02-13 NOTE — Telephone Encounter (Signed)
NO referral.

## 2017-02-14 DIAGNOSIS — H25811 Combined forms of age-related cataract, right eye: Secondary | ICD-10-CM | POA: Diagnosis not present

## 2017-02-14 DIAGNOSIS — H2511 Age-related nuclear cataract, right eye: Secondary | ICD-10-CM | POA: Diagnosis not present

## 2017-02-15 NOTE — Telephone Encounter (Signed)
LMOM to call.

## 2017-02-15 NOTE — Telephone Encounter (Signed)
Correction: Referral in workque dated 01/19/2017 from [facility].

## 2017-02-15 NOTE — Telephone Encounter (Signed)
[name] returned call. Pt is having some hemorrhoid/rectal bleeding. Scheduled for an OV with Walden Field, NP on 04/12/2017 at 11:00 AM.

## 2017-03-19 ENCOUNTER — Encounter: Payer: Self-pay | Admitting: Pediatrics

## 2017-04-12 ENCOUNTER — Ambulatory Visit: Payer: Medicare Other | Admitting: Nurse Practitioner

## 2017-05-08 ENCOUNTER — Ambulatory Visit: Payer: Medicare Other | Admitting: Cardiology

## 2017-05-10 ENCOUNTER — Emergency Department (HOSPITAL_COMMUNITY)
Admission: EM | Admit: 2017-05-10 | Discharge: 2017-05-10 | Disposition: A | Discharge status: Home / Self Care | Payer: Medicare Other | Attending: Emergency Medicine | Admitting: Emergency Medicine

## 2017-05-10 DIAGNOSIS — T673XXA Heat exhaustion, anhydrotic, initial encounter: Secondary | ICD-10-CM | POA: Diagnosis not present

## 2017-05-10 DIAGNOSIS — I10 Essential (primary) hypertension: Secondary | ICD-10-CM | POA: Insufficient documentation

## 2017-05-10 DIAGNOSIS — T675XXA Heat exhaustion, unspecified, initial encounter: Secondary | ICD-10-CM

## 2017-05-10 DIAGNOSIS — Z79899 Other long term (current) drug therapy: Secondary | ICD-10-CM | POA: Insufficient documentation

## 2017-05-10 DIAGNOSIS — F10920 Alcohol use, unspecified with intoxication, uncomplicated: Secondary | ICD-10-CM | POA: Diagnosis not present

## 2017-05-10 DIAGNOSIS — J449 Chronic obstructive pulmonary disease, unspecified: Secondary | ICD-10-CM | POA: Insufficient documentation

## 2017-05-10 DIAGNOSIS — I259 Chronic ischemic heart disease, unspecified: Secondary | ICD-10-CM | POA: Insufficient documentation

## 2017-05-10 DIAGNOSIS — R404 Transient alteration of awareness: Secondary | ICD-10-CM | POA: Diagnosis not present

## 2017-05-10 DIAGNOSIS — Z7982 Long term (current) use of aspirin: Secondary | ICD-10-CM | POA: Insufficient documentation

## 2017-05-10 DIAGNOSIS — F17210 Nicotine dependence, cigarettes, uncomplicated: Secondary | ICD-10-CM | POA: Diagnosis not present

## 2017-05-10 DIAGNOSIS — I252 Old myocardial infarction: Secondary | ICD-10-CM | POA: Insufficient documentation

## 2017-05-10 LAB — CBC WITH DIFFERENTIAL/PLATELET
Basophils Absolute: 0 10*3/uL (ref 0.0–0.1)
Basophils Relative: 0 %
Eosinophils Absolute: 0.1 10*3/uL (ref 0.0–0.7)
Eosinophils Relative: 2 %
HCT: 39.9 % (ref 39.0–52.0)
Hemoglobin: 13.5 g/dL (ref 13.0–17.0)
Lymphocytes Relative: 47 %
Lymphs Abs: 2.7 10*3/uL (ref 0.7–4.0)
MCH: 32.2 pg (ref 26.0–34.0)
MCHC: 33.8 g/dL (ref 30.0–36.0)
MCV: 95.2 fL (ref 78.0–100.0)
Monocytes Absolute: 0.3 10*3/uL (ref 0.1–1.0)
Monocytes Relative: 6 %
Neutro Abs: 2.5 10*3/uL (ref 1.7–7.7)
Neutrophils Relative %: 45 %
Platelets: 183 10*3/uL (ref 150–400)
RBC: 4.19 MIL/uL — ABNORMAL LOW (ref 4.22–5.81)
RDW: 13.4 % (ref 11.5–15.5)
WBC: 5.7 10*3/uL (ref 4.0–10.5)

## 2017-05-10 LAB — COMPREHENSIVE METABOLIC PANEL
ALT: 33 U/L (ref 17–63)
AST: 47 U/L — ABNORMAL HIGH (ref 15–41)
Albumin: 4 g/dL (ref 3.5–5.0)
Alkaline Phosphatase: 107 U/L (ref 38–126)
Anion gap: 11 (ref 5–15)
BUN: 8 mg/dL (ref 6–20)
CO2: 27 mmol/L (ref 22–32)
Calcium: 8.6 mg/dL — ABNORMAL LOW (ref 8.9–10.3)
Chloride: 102 mmol/L (ref 101–111)
Creatinine, Ser: 0.77 mg/dL (ref 0.61–1.24)
GFR calc Af Amer: >60 mL/min (ref >60)
GFR calc non Af Amer: >60 mL/min (ref >60)
Glucose, Bld: 93 mg/dL (ref 65–99)
Potassium: 4 mmol/L (ref 3.5–5.1)
Sodium: 140 mmol/L (ref 135–145)
Total Bilirubin: 0.7 mg/dL (ref 0.3–1.2)
Total Protein: 7.3 g/dL (ref 6.5–8.1)

## 2017-05-10 LAB — RAPID URINE DRUG SCREEN, HOSP PERFORMED
Amphetamines: NOT DETECTED
Barbiturates: NOT DETECTED
Benzodiazepines: NOT DETECTED
Cocaine: NOT DETECTED
Opiates: NOT DETECTED
Tetrahydrocannabinol: NOT DETECTED

## 2017-05-10 LAB — URINALYSIS, ROUTINE W REFLEX MICROSCOPIC
Bilirubin Urine: NEGATIVE
Glucose, UA: NEGATIVE mg/dL
Hgb urine dipstick: NEGATIVE
Ketones, ur: NEGATIVE mg/dL
Leukocytes, UA: NEGATIVE
Nitrite: NEGATIVE
Protein, ur: NEGATIVE mg/dL
Specific Gravity, Urine: 1.002 — ABNORMAL LOW (ref 1.005–1.030)
pH: 6 (ref 5.0–8.0)

## 2017-05-10 LAB — TROPONIN I: Troponin I: <0.03 ng/mL (ref <0.03)

## 2017-05-10 LAB — ETHANOL: Alcohol, Ethyl (B): 179 mg/dL — ABNORMAL HIGH (ref <10)

## 2017-05-10 MED ORDER — SODIUM CHLORIDE 0.9 % IV BOLUS (SEPSIS)
1000.0000 mL | Freq: Once | INTRAVENOUS | Status: AC
  Administered 2017-05-10: 1000 mL via INTRAVENOUS

## 2017-05-10 NOTE — Discharge Instructions (Signed)
Do not drink alcohol when working in the heat.

## 2017-05-10 NOTE — ED Provider Notes (Signed)
[facility] DEPT Provider Note   CSN: [id number] Arrival date & time: 05/10/17  1947     History   Chief Complaint Chief Complaint  Patient presents with  . Heat Exposure    HPI [name] is a 71 y.o. male.  Pt presents to the ED today feeling weak after working outside in the heat.  Pt said he was outside all day.  He did not drink any water, just tea and alcohol.  The pt is feeling better after getting in the A/C and getting IVFs.      Past Medical History:  Diagnosis Date  . Anginal pain ([place])   . CAD (coronary artery disease)    a. s/p INF MI 1997;  b. s/p CABG 2001;  c. [place] 11/2009:  3v CAD, S-PDA ok with 40% mid, S-OM ok, S-Dx ok, L-LAD ok, EF 50%;  d.  [place] 5/14:  Inferolateral scar, EF 46%, no ischemia  . COPD (chronic obstructive pulmonary disease) ([place])   . Dyspnea   . ETOH abuse   . GERD (gastroesophageal reflux disease)   . Hypercholesterolemia   . Hypertension   . Myocardial infarction ([place])   . Prostate cancer ([place])   . Tobacco abuse     Patient Active Problem List   Diagnosis Date Noted  . Acute respiratory failure ([place]) 07/21/2016  . Osteoarthritis of cervical spine with myelopathy and radiculopathy 07/21/2016  . COPD (chronic obstructive pulmonary disease) ([place]) 04/17/2016  . Radiculopathy affecting upper extremity 04/17/2016  . Numbness 03/28/2016  . Neuropathy 02/21/2016  . Neck pain 10/30/2015  . H/O: gout 08/18/2015  . Essential hypertension 06/15/2015  . GERD (gastroesophageal reflux disease) 11/26/2012  . Gout 11/26/2012  . CAD (coronary artery disease) 11/26/2012  . Chest pain 11/26/2012  . Hyperkalemia 11/26/2012  . Hyponatremia 11/26/2012  . Dysphagia 05/02/2012  . BRADYCARDIA 12/06/2009  . Hyperlipidemia 12/03/2009  . Alcohol abuse 12/03/2009  . TOBACCO USER 12/03/2009    Past Surgical History:  Procedure Laterality Date  . ANTERIOR CERVICAL DECOMPRESSION/DISCECTOMY FUSION 4 LEVELS N/A 07/21/2016   Procedure:  ANTERIOR CERVICAL DECOMPRESSION/DISCECTOMY FUSION CERVICAL TWO-THREE, CERVICAL THREE-FOUR,. CERVICAL FOUR-FIVE, CERVICAL  FIVE-SIX;  Surgeon: Ashok Pall, MD;  Location: [facility];  Service: Neurosurgery;  Laterality: N/A;  . BACK SURGERY    . CARDIAC CATHETERIZATION  2011  . CARDIAC SURGERY     Open Heart  . COLONOSCOPY     in remote past, Dr. Sharlett Iles. Obtaining records.   . CORONARY ANGIOPLASTY    . CORONARY ARTERY BYPASS GRAFT    . ESOPHAGOGASTRODUODENOSCOPY  05/27/2012   [id number] esophagus, stomach and duodenum s/p dilator  . PROSTATE SURGERY         Home Medications    Prior to Admission medications   Medication Sig Start Date End Date Taking? Authorizing Provider  acetaminophen (TYLENOL) 500 MG tablet Take 1,000 mg by mouth every 6 (six) hours as needed for moderate pain or headache.    [provider]  allopurinol (ZYLOPRIM) 100 MG tablet Take 1 tablet (100 mg total) by mouth daily. 02/02/17   Eustaquio Maize, MD  amLODipine (NORVASC) 5 MG tablet Take 1 tablet (5 mg total) by mouth daily. 02/02/17   Eustaquio Maize, MD  aspirin 81 MG tablet Take 81 mg by mouth at bedtime.     [provider]  atorvastatin (LIPITOR) 80 MG tablet Take 1 tablet (80 mg total) by mouth daily at 6 PM. 02/02/17   Eustaquio Maize, MD  budesonide-formoterol (SYMBICORT) 160-4.5 MCG/ACT inhaler Inhale 2 puffs into the lungs 2 (two) times daily. SAMPLE GIVEN 01/10/17   Terald Sleeper, PA-C  colchicine 0.6 MG tablet TAKE ONE TABLET BY MOUTH ONCE DAILY 09/11/16   Eustaquio Maize, MD  fluticasone [facility]) 50 MCG/ACT nasal spray Place 2 sprays into both nostrils daily. 10/26/16   Eustaquio Maize, MD  gabapentin (NEURONTIN) 300 MG capsule Take 2 capsules (600 mg total) by mouth 3 (three) times daily. 02/02/17   Eustaquio Maize, MD  metoprolol tartrate (LOPRESSOR) 25 MG tablet Take 0.5 tablets (12.5 mg total) by mouth 2 (two) times daily. 02/02/17   Eustaquio Maize, MD  nitroGLYCERIN (NITROSTAT)  0.4 MG SL tablet Place 1 tablet (0.4 mg total) under the tongue every 5 (five) minutes as needed for chest pain. 10/26/16   Eustaquio Maize, MD  omega-3 acid ethyl esters (LOVAZA) 1 g capsule TAKE ONE CAPSULE BY MOUTH TWICE DAILY (NEEDS APPOINTMENT FOR REFILLS) 02/02/17   Eustaquio Maize, MD  omeprazole (PRILOSEC) 20 MG capsule Take 20 mg by mouth daily.    [provider]  tamsulosin (FLOMAX) 0.4 MG CAPS capsule Take 1 capsule (0.4 mg total) by mouth daily. 02/02/17   Eustaquio Maize, MD  tiotropium (SPIRIVA) 18 MCG inhalation capsule Place 1 capsule (18 mcg total) into inhaler and inhale daily. 02/02/17   Eustaquio Maize, MD  VENTOLIN HFA 108 (90 Base) MCG/ACT inhaler INHALE TWO PUFFS INTO THE LUNGS EVERY 6 HOURS AS NEEDED FOR  WHEEZING  OR  SHORTNESS  OF  BREATH 01/05/17   Eustaquio Maize, MD    Family History Family History  Problem Relation Age of Onset  . Diabetes Mother   . Hypertension Father   . Heart disease Brother   . Diabetes Brother   . Heart disease Brother   . Diabetes Brother   . Multiple sclerosis Daughter   . Healthy Son   . Colon cancer Neg Hx     Social History Social History  Substance Use Topics  . Smoking status: Current Every Day Smoker    Packs/day: 0.25    Years: 50.00    Types: Cigarettes, E-cigarettes    Start date: 03/08/1970  . Smokeless tobacco: Current User    Types: Snuff    Last attempt to quit: 03/08/2012  . Alcohol use 0.0 oz/week     Comment: occasionally     Allergies   No known allergies   Review of Systems Review of Systems   Physical Exam Updated Vital Signs BP (!) 154/72 (BP Location: Left Arm)   Pulse 71   Temp 97.7 F (36.5 C) (Oral)   Resp 18   SpO2 100%   Physical Exam   ED Treatments / Results  Labs (all labs ordered are listed, but only abnormal results are displayed) Labs Reviewed  COMPREHENSIVE METABOLIC PANEL - Abnormal; Notable for the following:       Result Value   Calcium 8.6 (*)    AST 47 (*)     All other components within normal limits  ETHANOL - Abnormal; Notable for the following:    Alcohol, Ethyl (B) 179 (*)    All other components within normal limits  CBC WITH DIFFERENTIAL/PLATELET - Abnormal; Notable for the following:    RBC 4.19 (*)    All other components within normal limits  URINALYSIS, ROUTINE W REFLEX MICROSCOPIC - Abnormal; Notable for the following:    Color, Urine COLORLESS (*)  Specific Gravity, Urine 1.002 (*)    All other components within normal limits  TROPONIN I  RAPID URINE DRUG SCREEN, HOSP PERFORMED    EKG  EKG Interpretation  Date/Time:  Thursday May 10 2017 19:56:51 EDT Ventricular Rate:  70 PR Interval:  232 QRS Duration: 98 QT Interval:  436 QTC Calculation: 470 R Axis:   62 Text Interpretation:  Sinus rhythm with 1st degree A-V block Cannot rule out Inferior infarct , age undetermined Cannot rule out Anterior infarct , age undetermined Abnormal ECG Confirmed by Isla Pence [phone]) on 05/10/2017 9:26:54 PM       Radiology No results found.  Procedures Procedures (including critical care time)  Medications Ordered in ED Medications  sodium chloride 0.9 % bolus 1,000 mL (1,000 mLs Intravenous New Bag/Given 05/10/17 2016)     Initial Impression / Assessment and Plan / ED Course  I have reviewed the triage vital signs and the nursing notes.  Pertinent labs & imaging results that were available during my care of the patient were reviewed by me and considered in my medical decision making (see chart for details).    Pt is feeling much better after IVFs.  Pt is stable for d/c.  He is encouraged to avoid alcohol especially when he is working outside.  Final Clinical Impressions(s) / ED Diagnoses   Final diagnoses:  Heat exhaustion, initial encounter  Alcoholic intoxication without complication [facility])    New Prescriptions New Prescriptions   No medications on file     Isla Pence, MD 05/10/17 2136

## 2017-05-10 NOTE — ED Triage Notes (Signed)
Pt arrives via ems after getting hot while working outside.  Pt had been drinking, has IV started per ems and 200 cc NS given.  Pt denies complaints at this time.

## 2017-05-30 ENCOUNTER — Other Ambulatory Visit: Payer: Self-pay | Admitting: Pediatrics

## 2017-05-30 DIAGNOSIS — E785 Hyperlipidemia, unspecified: Secondary | ICD-10-CM

## 2017-06-18 ENCOUNTER — Other Ambulatory Visit: Payer: Self-pay

## 2017-06-18 ENCOUNTER — Ambulatory Visit (INDEPENDENT_AMBULATORY_CARE_PROVIDER_SITE_OTHER): Payer: Medicare Other | Admitting: Cardiology

## 2017-06-18 ENCOUNTER — Encounter: Payer: Self-pay | Admitting: Cardiology

## 2017-06-18 VITALS — BP 140/70 | HR 51 | Ht 66.0 in | Wt 159.0 lb

## 2017-06-18 DIAGNOSIS — I2510 Atherosclerotic heart disease of native coronary artery without angina pectoris: Secondary | ICD-10-CM | POA: Diagnosis not present

## 2017-06-18 DIAGNOSIS — I10 Essential (primary) hypertension: Secondary | ICD-10-CM | POA: Diagnosis not present

## 2017-06-18 DIAGNOSIS — E785 Hyperlipidemia, unspecified: Secondary | ICD-10-CM

## 2017-06-18 DIAGNOSIS — Z720 Tobacco use: Secondary | ICD-10-CM

## 2017-06-18 MED ORDER — AMLODIPINE BESYLATE 5 MG PO TABS: 5.0000 mg | ORAL_TABLET | Freq: Every day | ORAL | 3 refills | Status: DC

## 2017-06-18 MED ORDER — METOPROLOL TARTRATE 25 MG PO TABS: 12.5000 mg | ORAL_TABLET | Freq: Two times a day (BID) | ORAL | 3 refills | Status: DC

## 2017-06-18 MED ORDER — ATORVASTATIN CALCIUM 80 MG PO TABS
80.0000 mg | ORAL_TABLET | Freq: Every day | ORAL | 3 refills | Status: DC
Start: 2017-06-18 — End: 2018-06-18

## 2017-06-18 MED ORDER — VARENICLINE TARTRATE 0.5 MG PO TABS: ORAL_TABLET | ORAL | 0 refills | Status: DC

## 2017-06-18 MED ORDER — VARENICLINE TARTRATE 1 MG PO TABS: ORAL_TABLET | ORAL | 0 refills | Status: DC

## 2017-06-18 NOTE — Patient Instructions (Signed)
Your physician wants you to follow-up in: [place] will receive a reminder letter in the mail two months in advance. If you don't receive a letter, please call our office to schedule the follow-up appointment.  Your physician has recommended you make the following change in your medication:   START CHANTIX 0.5 MG DAILY FOR 3 DAYS THEN  CHANTIX 0.5 MG TWICE DAILY FOR 4  DAYS THEN  CHANTIX 1 MG TWICE DAILY FOR 59 WEEKS  Your physician has requested that you regularly monitor and record your blood pressure readings at home FOR 1 [place]. Please use the same machine at the same time of day to check your readings and record them to bring to your follow-up visit.    Thank you for choosing [facility]!!

## 2017-06-18 NOTE — Progress Notes (Signed)
Clinical Summary [name] is a 71 y.o.male seen today for follow up of the following medical problems.   1. CAD  - prior MI in 1997, prior CABG approx 13 years ago at [facility]  - cath 2011 showed patent grafts. 04/2013 echo shows LVEF 60%  - myoview 12/2012 shows old inferolateral scar, no ischemia     - no recent chest pain. Some SOB in the AM, that resolves.  - compliant with meds  2. HTN  - does not check at home  - compliant w/ meds   - lisionpril stopped by pcp per patient report. He did have some elevation of K after starting.     3. HL  - compliant with statin - upcoming labs with primary.   4 COPD - followed by pcp   5. AAA screen - negative [place] 06/2016    Past Medical History:  Diagnosis Date  . Anginal pain ([place])   . CAD (coronary artery disease)    a. s/p INF MI 1997;  b. s/p CABG 2001;  c. [place] 11/2009:  3v CAD, S-PDA ok with 40% mid, S-OM ok, S-Dx ok, L-LAD ok, EF 50%;  d.  [place] 5/14:  Inferolateral scar, EF 46%, no ischemia  . COPD (chronic obstructive pulmonary disease) ([place])   . Dyspnea   . ETOH abuse   . GERD (gastroesophageal reflux disease)   . Hypercholesterolemia   . Hypertension   . Myocardial infarction ([place])   . Prostate cancer ([place])   . Tobacco abuse      Allergies  Allergen Reactions  . No Known Allergies      Current Outpatient Medications  Medication Sig Dispense Refill  . acetaminophen (TYLENOL) 500 MG tablet Take 1,000 mg by mouth every 6 (six) hours as needed for moderate pain or headache.    . allopurinol (ZYLOPRIM) 100 MG tablet Take 1 tablet (100 mg total) by mouth daily. 90 tablet 1  . amLODipine (NORVASC) 5 MG tablet Take 1 tablet (5 mg total) by mouth daily. 90 tablet 1  . aspirin 81 MG tablet Take 81 mg by mouth at bedtime.     Marland Kitchen atorvastatin (LIPITOR) 80 MG tablet Take 1 tablet (80 mg total) by mouth daily at 6 PM. 90 tablet 1  . budesonide-formoterol (SYMBICORT) 160-4.5 MCG/ACT inhaler Inhale  2 puffs into the lungs 2 (two) times daily. SAMPLE GIVEN 1 Inhaler 3  . colchicine 0.6 MG tablet TAKE ONE TABLET BY MOUTH ONCE DAILY 30 tablet 4  . fluticasone (FLONASE) 50 MCG/ACT nasal spray Place 2 sprays into both nostrils daily. 16 g 6  . gabapentin (NEURONTIN) 300 MG capsule Take 2 capsules (600 mg total) by mouth 3 (three) times daily. 180 capsule 5  . metoprolol tartrate (LOPRESSOR) 25 MG tablet Take 0.5 tablets (12.5 mg total) by mouth 2 (two) times daily. 90 tablet 3  . nitroGLYCERIN (NITROSTAT) 0.4 MG SL tablet Place 1 tablet (0.4 mg total) under the tongue every 5 (five) minutes as needed for chest pain. 20 tablet 3  . omega-3 acid ethyl esters (LOVAZA) 1 g capsule TAKE 1 CAPSULE BY MOUTH TWICE DAILY. PATIENT NEEDS APPOINTMENT FOR REFILLS. 180 capsule 0  . omeprazole (PRILOSEC) 20 MG capsule Take 20 mg by mouth daily.    . tamsulosin (FLOMAX) 0.4 MG CAPS capsule Take 1 capsule (0.4 mg total) by mouth daily. 90 capsule 1  . tiotropium (SPIRIVA) 18 MCG inhalation capsule Place 1 capsule (18 mcg total) into  inhaler and inhale daily. 90 capsule 3  . VENTOLIN HFA 108 (90 Base) MCG/ACT inhaler INHALE TWO PUFFS INTO THE LUNGS EVERY 6 HOURS AS NEEDED FOR  WHEEZING  OR  SHORTNESS  OF  BREATH 9 each 2   No current facility-administered medications for this visit.      Past Surgical History:  Procedure Laterality Date  . BACK SURGERY    . CARDIAC CATHETERIZATION  2011  . CARDIAC SURGERY     [facility]  . COLONOSCOPY     in remote past, Dr. Sharlett Iles. Obtaining records.   . CORONARY ANGIOPLASTY    . CORONARY ARTERY BYPASS GRAFT    . ESOPHAGOGASTRODUODENOSCOPY  05/27/2012   [facility]:WCBJSE esophagus, stomach and duodenum s/p dilator  . PROSTATE SURGERY       Allergies  Allergen Reactions  . No Known Allergies       Family History  Problem Relation Age of Onset  . Diabetes Mother   . Hypertension Father   . Heart disease Brother   . Diabetes Brother   . Heart disease Brother     . Diabetes Brother   . Multiple sclerosis Daughter   . Healthy Son   . Colon cancer Neg Hx      Social History [name] reports that he has been smoking cigarettes and e-cigarettes.  He started smoking about 47 years ago. He has a 12.50 pack-year smoking history. His smokeless tobacco use includes snuff. [name] reports that he drinks alcohol.   Review of Systems CONSTITUTIONAL: No weight loss, fever, chills, weakness or fatigue.  HEENT: Eyes: No visual loss, blurred vision, double vision or yellow sclerae.No hearing loss, sneezing, congestion, runny nose or sore throat.  SKIN: No rash or itching.  CARDIOVASCULAR: per hpi RESPIRATORY: per hpi GASTROINTESTINAL: No anorexia, nausea, vomiting or diarrhea. No abdominal pain or blood.  GENITOURINARY: No burning on urination, no polyuria NEUROLOGICAL: No headache, dizziness, syncope, paralysis, ataxia, numbness or tingling in the extremities. No change in bowel or bladder control.  MUSCULOSKELETAL: No muscle, back pain, joint pain or stiffness.  LYMPHATICS: No enlarged nodes. No history of splenectomy.  PSYCHIATRIC: No history of depression or anxiety.  ENDOCRINOLOGIC: No reports of sweating, cold or heat intolerance. No polyuria or polydipsia.  Marland Kitchen   Physical Examination Vitals:   06/18/17 1540  BP: 140/70  Pulse: (!) 51  SpO2: 93%   Vitals:   06/18/17 1540  Weight: 159 lb (72.1 kg)  Height: 5\' 6"  (1.676 m)    Gen: resting comfortably, no acute distress HEENT: no scleral icterus, pupils equal round and reactive, no palptable cervical adenopathy,  CV: RRR, no mr/g, no jvd Resp: Clear to auscultation bilaterally GI: abdomen is soft, non-tender, non-distended, normal bowel sounds, no hepatosplenomegaly MSK: extremities are warm, no edema.  Skin: warm, no rash Neuro:  no focal deficits Psych: appropriate affect   Diagnostic Studies 11/25/2009:  Cardiac Cath Findings: Left ventricular angiogram was performed in the RAO  projection and  showed normal left ventricular systolic function with ejection  fraction estimated at 50%. Mild mitral regurgitation was noted.  10.Aortic root angiogram was performed and did not show enlargement of  the aortic root.  IMPRESSION:  1. Severe triple-vessel coronary artery disease.  2. Patent bypass grafts 4/4.  3. Low normal left ventricular systolic function.   12/2012 Myoview  Inferolateral scar, no active ischemia, LVEF 46%.   05/01/13 Echo: LVEF 55-60%, grade II diastolic dysfunction, multiple WMAs, mild MR,   06/2016 AAA [place]  No aneurysm  Assessment and Plan   1.CAD  - asymptomatic, continue current meds  2. HTN  - mildly elevated of goal <130/80, submit bp log in 1 week  3. HL  - continue statin, request labs from pcp   4.Tobacco abuse - given Rx for chantix   Arnoldo Lenis, M.D.

## 2017-06-22 ENCOUNTER — Encounter: Payer: Self-pay | Admitting: Cardiology

## 2017-06-22 DIAGNOSIS — H04123 Dry eye syndrome of bilateral lacrimal glands: Secondary | ICD-10-CM | POA: Diagnosis not present

## 2017-06-25 ENCOUNTER — Telehealth: Payer: Self-pay | Admitting: *Deleted

## 2017-06-25 NOTE — Telephone Encounter (Signed)
Pt left VM with BP readings - no med changes made at 11/12 OV                       BP    HR 06/19/17 - 126/68  56 06/20/17 - 134/83  56 06/21/17 - 123/60  45 06/22/17 - 133/68  54 06/23/17 - 122/61  46 06/24/17 - 137/68  55 06/25/17 - 128/68  49

## 2017-06-26 NOTE — Telephone Encounter (Signed)
Numbers look good, no changes   [name] MD

## 2017-06-26 NOTE — Telephone Encounter (Signed)
Pt aware.

## 2017-07-02 DIAGNOSIS — M4712 Other spondylosis with myelopathy, cervical region: Secondary | ICD-10-CM | POA: Diagnosis not present

## 2017-07-23 ENCOUNTER — Encounter: Payer: Self-pay | Admitting: Pediatrics

## 2017-07-23 ENCOUNTER — Ambulatory Visit (INDEPENDENT_AMBULATORY_CARE_PROVIDER_SITE_OTHER): Payer: Medicare Other | Admitting: Pediatrics

## 2017-07-23 VITALS — BP 116/74 | HR 61 | Temp 97.4°F | Ht 66.0 in | Wt 159.4 lb

## 2017-07-23 DIAGNOSIS — Z8739 Personal history of other diseases of the musculoskeletal system and connective tissue: Secondary | ICD-10-CM | POA: Diagnosis not present

## 2017-07-23 DIAGNOSIS — N400 Enlarged prostate without lower urinary tract symptoms: Secondary | ICD-10-CM | POA: Diagnosis not present

## 2017-07-23 DIAGNOSIS — J441 Chronic obstructive pulmonary disease with (acute) exacerbation: Secondary | ICD-10-CM | POA: Diagnosis not present

## 2017-07-23 DIAGNOSIS — Z720 Tobacco use: Secondary | ICD-10-CM

## 2017-07-23 DIAGNOSIS — E785 Hyperlipidemia, unspecified: Secondary | ICD-10-CM

## 2017-07-23 DIAGNOSIS — M109 Gout, unspecified: Secondary | ICD-10-CM

## 2017-07-23 DIAGNOSIS — Z23 Encounter for immunization: Secondary | ICD-10-CM

## 2017-07-23 DIAGNOSIS — G629 Polyneuropathy, unspecified: Secondary | ICD-10-CM

## 2017-07-23 DIAGNOSIS — Z1211 Encounter for screening for malignant neoplasm of colon: Secondary | ICD-10-CM | POA: Diagnosis not present

## 2017-07-23 DIAGNOSIS — J449 Chronic obstructive pulmonary disease, unspecified: Secondary | ICD-10-CM | POA: Diagnosis not present

## 2017-07-23 MED ORDER — TIOTROPIUM BROMIDE MONOHYDRATE 18 MCG IN CAPS: 18.0000 ug | ORAL_CAPSULE | Freq: Every day | RESPIRATORY_TRACT | 3 refills | Status: DC

## 2017-07-23 MED ORDER — TAMSULOSIN HCL 0.4 MG PO CAPS: 0.4000 mg | ORAL_CAPSULE | Freq: Every day | ORAL | 1 refills | Status: DC

## 2017-07-23 MED ORDER — GABAPENTIN 300 MG PO CAPS: 600.0000 mg | ORAL_CAPSULE | Freq: Three times a day (TID) | ORAL | 5 refills | Status: DC

## 2017-07-23 MED ORDER — COLCHICINE 0.6 MG PO TABS: ORAL_TABLET | ORAL | 2 refills | Status: DC

## 2017-07-23 MED ORDER — OMEGA-3-ACID ETHYL ESTERS 1 G PO CAPS: ORAL_CAPSULE | ORAL | 1 refills | Status: DC

## 2017-07-23 MED ORDER — ALLOPURINOL 100 MG PO TABS: 100.0000 mg | ORAL_TABLET | Freq: Every day | ORAL | 1 refills | Status: DC

## 2017-07-23 MED ORDER — ALBUTEROL SULFATE HFA 108 (90 BASE) MCG/ACT IN AERS: INHALATION_SPRAY | RESPIRATORY_TRACT | 2 refills | Status: DC

## 2017-07-23 NOTE — Progress Notes (Signed)
Subjective:   [name], male    DOB: 04-18-1946, 71 y.o.   MRN: [id number] CC: Follow-up (Med check)  HPI: [name] is a 71 y.o. male presenting for Follow-up (Med check)  HTN: no HA, CP, SOB Taking meds regularly  CAD: following with cardiology Exertion not limited by symptoms   COPD: coughing, wheezing 3 weeks ago with URI symptoms Improved after a few days  Gout: a few days ago had some swelling in foot, took colchicine with resolution of symptoms, now improved  Smoking 1/2 ppd, wife, brother who libves with them also smoking in the house Wife has successfully quit for two years before  EtOH use: drinks a 12 pack on the weekends sometimes Doesn't drink every day  Missed follow up for colonoscopy, urology for prostate cancer  Neuropathy: s/p neck surgery, improved after surgery but not resolved Taking gabapentin twice a day, 630m, he isnt sure if it is helping  Relevant past medical, surgical, family and social history reviewed. Allergies and medications reviewed and updated. Social History   Tobacco Use  Smoking Status Current Every Day Smoker  . Packs/day: 0.50  . Years: 50.00  . Pack years: 25.00  . Types: Cigarettes, E-cigarettes  . Start date: 03/08/1970  Smokeless Tobacco Current User  . Types: Snuff  . Last attempt to quit: 03/08/2012   ROS: Per HPI   Objective:    BP 116/74   Pulse 61   Temp (!) 97.4 F (36.3 C) (Oral)   Ht _0  (1.676 m)   Wt 159 lb 6.4 oz (72.3 kg)   BMI 25.73 kg/m   Wt Readings from Last 3 Encounters:  07/23/17 159 lb 6.4 oz (72.3 kg)  06/18/17 159 lb (72.1 kg)  02/02/17 153 lb 6.4 oz (69.6 kg)    Gen: NAD, alert, cooperative with exam, NCAT EYES: EOMI, no conjunctival injection, or no icterus ENT:  TMs pearly gray b/l, OP without erythema LYMPH: no cervical LAD CV: NRRR, normal S1/S2, no murmur, distal pulses 2+ b/l Resp: CTABL, no wheezes, normal WOB Abd: +BS, soft, NTND. no guarding or  organomegaly Ext: No edema, warm Neuro: Alert and oriented, strength equal b/l UE and LE, coordination grossly normal MSK: normal muscle bulk  Assessment & Plan:  RTaelonwas seen today for follow-up med problems   Diagnoses and all orders for this visit:  Colon cancer screening -     Ambulatory referral to Gastroenterology  Benign prostatic hyperplasia without lower urinary tract symptoms Due for f/u appt with urology for prostate ca -     tamsulosin (FLOMAX) 0.4 MG CAPS capsule; Take 1 capsule (0.4 mg total) by mouth daily.  H/O: gout Takes colchicine for acute flares, below daily -     allopurinol (ZYLOPRIM) 100 MG tablet; Take 1 tablet (100 mg total) by mouth daily. -     BMP8+EGFR  Hyperlipidemia, unspecified hyperlipidemia type Cont statin -     omega-3 acid ethyl esters (LOVAZA) 1 g capsule; TAKE 1 CAPSULE BY MOUTH TWICE DAILY. PATIENT NEEDS APPOINTMENT FOR REFILLS. -     Lipid panel  Chronic obstructive pulmonary disease, unspecified COPD type (HCC) Stable, cont spiriva -     tiotropium (SPIRIVA) 18 MCG inhalation capsule; Place 1 capsule (18 mcg total) into inhaler and inhale daily. -     albuterol (VENTOLIN HFA) 108 (90 Base) MCG/ACT inhaler; INHALE TWO PUFFS INTO THE LUNGS EVERY 6 HOURS AS NEEDED FOR  WHEEZING  OR  SHORTNESS  OF  BREATH  Neuropathy OK to try off of it, taking 610m BID now, go to 3032mBID then stop If symptoms worsen can restart -     gabapentin (NEURONTIN) 300 MG capsule; Take 2 capsules (600 mg total) by mouth 3 (three) times daily.  Acute gout of right foot, unspecified cause Take below as needed -     colchicine 0.6 MG tablet; Take 2 the first day of flare, then one daily until flare resolved  Tobacco use Tried chantix in the past, didn't help Open to quitting, has been hard Cessation strategies dicussed  Follow up plan: Return in about 6 months (around 01/21/2018). CaAssunta FoundMD [place]

## 2017-07-24 LAB — BMP8+EGFR
BUN/Creatinine Ratio: 10 (ref 10–24)
BUN: 9 mg/dL (ref 8–27)
CO2: 28 mmol/L (ref 20–29)
Calcium: 9.2 mg/dL (ref 8.6–10.2)
Chloride: 101 mmol/L (ref 96–106)
Creatinine, Ser: 0.91 mg/dL (ref 0.76–1.27)
GFR calc Af Amer: 99 mL/min/{1.73_m2} (ref >59)
GFR calc non Af Amer: 86 mL/min/{1.73_m2} (ref >59)
Glucose: 97 mg/dL (ref 65–99)
Potassium: 4.4 mmol/L (ref 3.5–5.2)
Sodium: 139 mmol/L (ref 134–144)

## 2017-07-24 LAB — LIPID PANEL
Chol/HDL Ratio: 3.2 ratio (ref 0.0–5.0)
Cholesterol, Total: 134 mg/dL (ref 100–199)
HDL: 42 mg/dL (ref >39)
LDL Calculated: 56 mg/dL (ref 0–99)
Triglycerides: 181 mg/dL — ABNORMAL HIGH (ref 0–149)
VLDL Cholesterol Cal: 36 mg/dL (ref 5–40)

## 2017-08-14 ENCOUNTER — Ambulatory Visit: Payer: Medicare Other

## 2017-09-06 ENCOUNTER — Ambulatory Visit (INDEPENDENT_AMBULATORY_CARE_PROVIDER_SITE_OTHER): Payer: Medicare Other

## 2017-09-06 DIAGNOSIS — Z1211 Encounter for screening for malignant neoplasm of colon: Secondary | ICD-10-CM

## 2017-09-06 MED ORDER — PEG 3350-KCL-NA BICARB-NACL 420 G PO SOLR: 4000.0000 mL | ORAL | 0 refills | Status: DC

## 2017-09-06 NOTE — Progress Notes (Signed)
Gastroenterology Pre-Procedure Review  Request Date:09/06/17 Requesting Physician: Dr.Vincent [facility])  PATIENT REVIEW QUESTIONS: The patient responded to the following health history questions as indicated:    1. Diabetes Melitis: no 2. Joint replacements in the past 12 months: no 3. Major health problems in the past 3 months: no 4. Has an artificial valve or MVP: no 5. Has a defibrillator:no 6. Has been advised in past to take antibiotics in advance of a procedure like teeth cleaning: no 7. Family history of colon cancer: no  8. Alcohol Use: yes occasionally  9. History of sleep apnea: no  10. History of coronary artery or other vascular stents placed within the last 12 months: no 11. History of any prior anesthesia complications: no    MEDICATIONS & ALLERGIES:    Patient reports the following regarding taking any blood thinners:   Plavix? no Aspirin? Yes 81mg  Coumadin? no Brilinta? no Xarelto? no Eliquis? no Pradaxa? no Savaysa? no Effient? no  Patient confirms/reports the following medications:  Current Outpatient Medications  Medication Sig Dispense Refill  . acetaminophen (TYLENOL) 500 MG tablet Take 1,000 mg by mouth every 6 (six) hours as needed for moderate pain or headache.    . albuterol (VENTOLIN HFA) 108 (90 Base) MCG/ACT inhaler INHALE TWO PUFFS INTO THE LUNGS EVERY 6 HOURS AS NEEDED FOR  WHEEZING  OR  SHORTNESS  OF  BREATH 9 each 2  . allopurinol (ZYLOPRIM) 100 MG tablet Take 1 tablet (100 mg total) by mouth daily. 90 tablet 1  . amLODipine (NORVASC) 5 MG tablet Take 1 tablet (5 mg total) daily by mouth. 90 tablet 3  . aspirin 81 MG tablet Take 81 mg by mouth at bedtime.     Marland Kitchen atorvastatin (LIPITOR) 80 MG tablet Take 1 tablet (80 mg total) daily at 6 PM by mouth. 90 tablet 3  . colchicine 0.6 MG tablet Take 2 the first day of flare, then one daily until flare resolved 30 tablet 2  . fluticasone (FLONASE) 50 MCG/ACT nasal spray Place 2 sprays into both nostrils  daily. 16 g 6  . gabapentin (NEURONTIN) 300 MG capsule Take 2 capsules (600 mg total) by mouth 3 (three) times daily. 180 capsule 5  . metoprolol tartrate (LOPRESSOR) 25 MG tablet Take 0.5 tablets (12.5 mg total) 2 (two) times daily by mouth. 90 tablet 3  . nitroGLYCERIN (NITROSTAT) 0.4 MG SL tablet Place 1 tablet (0.4 mg total) under the tongue every 5 (five) minutes as needed for chest pain. 20 tablet 3  . omega-3 acid ethyl esters (LOVAZA) 1 g capsule TAKE 1 CAPSULE BY MOUTH TWICE DAILY. PATIENT NEEDS APPOINTMENT FOR REFILLS. 180 capsule 1  . omeprazole (PRILOSEC) 20 MG capsule Take 20 mg by mouth daily.    . tamsulosin (FLOMAX) 0.4 MG CAPS capsule Take 1 capsule (0.4 mg total) by mouth daily. 90 capsule 1  . tiotropium (SPIRIVA) 18 MCG inhalation capsule Place 1 capsule (18 mcg total) into inhaler and inhale daily. 90 capsule 3   No current facility-administered medications for this visit.     Patient confirms/reports the following allergies:  Allergies  Allergen Reactions  . No Known Allergies     No orders of the defined types were placed in this encounter.   AUTHORIZATION INFORMATION Primary Insurance: [employer] medicare  ID #: [id number] Pre-Cert / Josem Kaufmann required: no   SCHEDULE INFORMATION: Procedure has been scheduled as follows:  Date: 10/10/17, Time: 1:15 Location: [facility] Dr.Rourk  This Gastroenterology Pre-Precedure Review Form is being routed  to the following provider(s): Roseanne Kaufman NP

## 2017-09-06 NOTE — Patient Instructions (Addendum)
[name]   24-Aug-1945 MRN: [id number]    Procedure Date: 11/21/17 Time to register: 9:30 Place to register: [facility] Procedure Time: 10:30 Scheduled provider: [place] WITH [facility]TE SPLIT PREP  Please notify us immediately if you are diabetic, take iron supplements, or if you are on Coumadin or any other blood thinners.     You will need to purchase 1 fleet enema and 1 box of Bisacodyl '5mg'$  tablets.   2 DAYS BEFORE PROCEDURE:  DATE: 11/19/17/19   DAY: Monday Begin clear liquid diet AFTER your lunch meal. NO SOLID FOODS after this point.  1 DAY BEFORE PROCEDURE:  DATE: 11/20/17  DAY: Tuesday Continue clear liquids the entire day - NO SOLID FOOD.     At 2:00 pm:  Take 2 Bisacodyl tablets.   At 4:00pm:  Start drinking your solution. Make sure you mix well per instructions on the bottle. Try to drink 1 (one) 8 ounce glass every 10-15 minutes until you have consumed HALF the jug. You should complete by 6:00pm.You must keep the left over solution refrigerated until completed next day.  Continue clear liquids. You must drink plenty of clear liquids to prevent dehyration and kidney failure. Nothing to eat or drink after midnight.  EXCEPTION: If you take medications for your heart, blood pressure or breathing, you may take these medications with a small amount of clear liquid.    DAY OF PROCEDURE:   DATE: 11/21/17   DAY: Wednesday   Five hours before your procedure time @ 5:30am:  Finish remaining amout of bowel prep, drinking 1 (one) 8 ounce glass every 10-15 minutes until complete. You have two hours to consume remaining prep.   Three hours before your procedure time '@7'$ :30am:  Nothing by mouth.   At least one hour before going to the hospital:  Give yourself one Fleet enema. You may take your morning medications with sip of water unless we have instructed otherwise.      Please see below for Dietary Information.  CLEAR LIQUIDS  INCLUDE:  Water Jello (NOT red in color)   Ice Popsicles (NOT red in color)   Tea (sugar ok, no milk/cream) Powdered fruit flavored drinks  Coffee (sugar ok, no milk/cream) Gatorade/ Lemonade/ Kool-Aid  (NOT red in color)   Juice: apple, white grape, white cranberry Soft drinks  Clear bullion, consomme, broth (fat free beef/chicken/vegetable)  Carbonated beverages (any kind)  Strained chicken noodle soup Hard Candy   Remember: Clear liquids are liquids that will allow you to see your fingers on the other side of a clear glass. Be sure liquids are NOT red in color, and not cloudy, but CLEAR.  DO NOT EAT OR DRINK ANY OF THE FOLLOWING:  Dairy products of any kind   Cranberry juice Tomato juice / V8 juice   Grapefruit juice Orange juice     Red grape juice  Do not eat any solid foods, including such foods as: cereal, oatmeal, yogurt, fruits, vegetables, creamed soups, eggs, bread, crackers, pureed foods in a blender, etc.   HELPFUL HINTS FOR DRINKING PREP SOLUTION:   Make sure prep is extremely cold. Mix and refrigerate the the morning of the prep. You may also put in the freezer.   You may try mixing some Crystal Light or Country Time Lemonade if you prefer. Mix in small amounts; add more if necessary.  Try drinking through a straw  Rinse mouth with water or a mouthwash between glasses, to remove after-taste.  Try sipping on a cold beverage /ice/ popsicles between glasses of prep.  Place a piece of sugar-free hard candy in mouth between glasses.  If you become nauseated, try consuming smaller amounts, or stretch out the time between glasses. Stop for 30-60 minutes, then slowly start back drinking.        OTHER INSTRUCTIONS  You will need a responsible adult at least 70 years of age to accompany you and drive you home. This person must remain in the waiting room during your procedure. The hospital will cancel your procedure if you do not have a responsible adult with you.    1. Wear loose fitting clothing that is easily removed. 2. Leave jewelry and other valuables at home.  3. Remove all body piercing jewelry and leave at home. 4. Total time from sign-in until discharge is approximately 2-3 hours. 5. You should go home directly after your procedure and rest. You can resume normal activities the day after your procedure. 6. The day of your procedure you should not:  Drive  Make legal decisions  Operate machinery  Drink alcohol  Return to work   You may call the office (Dept: [phone]) before 5:00pm, or page the doctor on call [phone]) after 5:00pm, for further instructions, if necessary.   Insurance Information YOU WILL NEED TO CHECK WITH YOUR INSURANCE COMPANY FOR THE BENEFITS OF COVERAGE YOU HAVE FOR THIS PROCEDURE.  UNFORTUNATELY, NOT ALL INSURANCE COMPANIES HAVE BENEFITS TO COVER ALL OR PART OF THESE TYPES OF PROCEDURES.  IT IS YOUR RESPONSIBILITY TO CHECK YOUR BENEFITS, HOWEVER, WE WILL BE GLAD TO ASSIST YOU WITH ANY CODES YOUR INSURANCE COMPANY MAY NEED.    PLEASE NOTE THAT MOST INSURANCE COMPANIES WILL NOT COVER A SCREENING COLONOSCOPY FOR PEOPLE UNDER THE AGE OF 50  IF YOU HAVE BCBS INSURANCE, YOU MAY HAVE BENEFITS FOR A SCREENING COLONOSCOPY BUT IF POLYPS ARE FOUND THE DIAGNOSIS WILL CHANGE AND THEN YOU MAY HAVE A DEDUCTIBLE THAT WILL NEED TO BE MET. SO PLEASE MAKE SURE YOU CHECK YOUR BENEFITS FOR A SCREENING COLONOSCOPY AS WELL AS A DIAGNOSTIC COLONOSCOPY.

## 2017-09-07 NOTE — Progress Notes (Signed)
Appropriate.

## 2017-09-28 ENCOUNTER — Ambulatory Visit (INDEPENDENT_AMBULATORY_CARE_PROVIDER_SITE_OTHER): Payer: Medicare Other | Admitting: Physician Assistant

## 2017-09-28 ENCOUNTER — Telehealth: Payer: Self-pay | Admitting: Physician Assistant

## 2017-09-28 ENCOUNTER — Encounter: Payer: Self-pay | Admitting: Physician Assistant

## 2017-09-28 ENCOUNTER — Ambulatory Visit: Payer: Medicare Other | Admitting: Family Medicine

## 2017-09-28 VITALS — BP 135/64 | HR 77 | Temp 101.3°F | Resp 22 | Ht 66.0 in | Wt 157.6 lb

## 2017-09-28 DIAGNOSIS — R6889 Other general symptoms and signs: Secondary | ICD-10-CM

## 2017-09-28 DIAGNOSIS — J101 Influenza due to other identified influenza virus with other respiratory manifestations: Secondary | ICD-10-CM

## 2017-09-28 LAB — VERITOR FLU A/B WAIVED
Influenza A: POSITIVE — AB
Influenza B: NEGATIVE

## 2017-09-28 MED ORDER — OSELTAMIVIR PHOSPHATE 75 MG PO CAPS: 75.0000 mg | ORAL_CAPSULE | Freq: Two times a day (BID) | ORAL | 0 refills | Status: DC

## 2017-09-28 NOTE — Patient Instructions (Signed)
In a few days you may receive a survey in the mail or online from [employer] regarding your visit with us today. Please take a moment to fill this out. Your feedback is very important to our whole office. It can help us better understand your needs as well as improve your experience and satisfaction. Thank you for taking your time to complete it. We care about you.  Jacquelin Krajewski, PA-C  

## 2017-09-28 NOTE — Telephone Encounter (Signed)
There is nothing else like Tamiflu

## 2017-09-28 NOTE — Telephone Encounter (Signed)
Pt notified no RX to replace Tamiflu Take OTC for cough, congestion, fever and body aches

## 2017-09-29 NOTE — Progress Notes (Signed)
BP 135/64   Pulse 77   Temp (!) 101.3 F (38.5 C) (Oral)   Resp (!) 22   Ht 5\' 6"  (1.676 m)   Wt 157 lb 9.6 oz (71.5 kg)   SpO2 94%   BMI 25.44 kg/m    Subjective:    [name], male    DOB: 02/11/1946, 72 y.o.   MRN: [id number]  HPI: [name] is a 72 y.o. male presenting on 09/28/2017 for Cough; Generalized Body Aches; and Fever  This patient has had less than 2 days severe fever, chills, myalgias.  Complains of sinus headache and postnasal drainage. There is copious drainage at times. Associated sore throat, decreased appetite and headache.  Has been exposed to influenza.    Past Medical History:  Diagnosis Date  . Anginal pain ([place])   . CAD (coronary artery disease)    a. s/p INF MI 1997;  b. s/p CABG 2001;  c. [place] 11/2009:  3v CAD, S-PDA ok with 40% mid, S-OM ok, S-Dx ok, L-LAD ok, EF 50%;  d.  [place] 5/14:  Inferolateral scar, EF 46%, no ischemia  . COPD (chronic obstructive pulmonary disease) ([place])   . Dyspnea   . ETOH abuse   . GERD (gastroesophageal reflux disease)   . Hypercholesterolemia   . Hypertension   . Myocardial infarction ([place])   . Prostate cancer ([place])   . Tobacco abuse    Relevant past medical, surgical, family and social history reviewed and updated as indicated. Interim medical history since our last visit reviewed. Allergies and medications reviewed and updated. DATA REVIEWED: CHART IN EPIC  Family History reviewed for pertinent findings.  Review of Systems  Constitutional: Positive for activity change, fatigue and fever. Negative for appetite change.  HENT: Positive for congestion and sore throat. Negative for sinus pressure.   Eyes: Negative.  Negative for pain and visual disturbance.  Respiratory: Negative for cough, chest tightness, shortness of breath and wheezing.   Cardiovascular: Negative.  Negative for chest pain, palpitations and leg swelling.  Gastrointestinal: Positive for nausea. Negative for abdominal pain,  diarrhea and vomiting.  Endocrine: Negative.   Genitourinary: Negative.   Musculoskeletal: Positive for back pain and myalgias. Negative for arthralgias.  Skin: Negative.  Negative for color change and rash.  Neurological: Positive for headaches. Negative for weakness and numbness.  Psychiatric/Behavioral: Negative.     Allergies as of 09/28/2017      Reactions   No Known Allergies       Medication List        Accurate as of 09/28/17 11:59 PM. Always use your most recent med list.          acetaminophen 500 MG tablet Commonly known as:  TYLENOL Take 1,000 mg by mouth every 6 (six) hours as needed for moderate pain or headache.   albuterol 108 (90 Base) MCG/ACT inhaler Commonly known as:  VENTOLIN HFA INHALE TWO PUFFS INTO THE LUNGS EVERY 6 HOURS AS NEEDED FOR  WHEEZING  OR  SHORTNESS  OF  BREATH   allopurinol 100 MG tablet Commonly known as:  ZYLOPRIM Take 1 tablet (100 mg total) by mouth daily.   amLODipine 5 MG tablet Commonly known as:  NORVASC Take 1 tablet (5 mg total) daily by mouth.   aspirin 81 MG tablet Take 81 mg by mouth at bedtime.   atorvastatin 80 MG tablet Commonly known as:  LIPITOR Take 1 tablet (80 mg total) daily at 6 PM by  mouth.   colchicine 0.6 MG tablet Take 2 the first day of flare, then one daily until flare resolved   fluticasone 50 MCG/ACT nasal spray Commonly known as:  FLONASE Place 2 sprays into both nostrils daily.   gabapentin 300 MG capsule Commonly known as:  NEURONTIN Take 2 capsules (600 mg total) by mouth 3 (three) times daily.   metoprolol tartrate 25 MG tablet Commonly known as:  LOPRESSOR Take 0.5 tablets (12.5 mg total) 2 (two) times daily by mouth.   nitroGLYCERIN 0.4 MG SL tablet Commonly known as:  NITROSTAT Place 1 tablet (0.4 mg total) under the tongue every 5 (five) minutes as needed for chest pain.   omega-3 acid ethyl esters 1 g capsule Commonly known as:  LOVAZA TAKE 1 CAPSULE BY MOUTH TWICE DAILY.  PATIENT NEEDS APPOINTMENT FOR REFILLS.   omeprazole 20 MG capsule Commonly known as:  PRILOSEC Take 20 mg by mouth daily.   oseltamivir 75 MG capsule Commonly known as:  TAMIFLU Take 1 capsule (75 mg total) by mouth 2 (two) times daily.   polyethylene glycol-electrolytes 420 g solution Commonly known as:  TRILYTE Take 4,000 mLs by mouth as directed.   tamsulosin 0.4 MG Caps capsule Commonly known as:  FLOMAX Take 1 capsule (0.4 mg total) by mouth daily.   tiotropium 18 MCG inhalation capsule Commonly known as:  SPIRIVA Place 1 capsule (18 mcg total) into inhaler and inhale daily.          Objective:    BP 135/64   Pulse 77   Temp (!) 101.3 F (38.5 C) (Oral)   Resp (!) 22   Ht 5\' 6"  (1.676 m)   Wt 157 lb 9.6 oz (71.5 kg)   SpO2 94%   BMI 25.44 kg/m   Allergies  Allergen Reactions  . No Known Allergies     Wt Readings from Last 3 Encounters:  09/28/17 157 lb 9.6 oz (71.5 kg)  07/23/17 159 lb 6.4 oz (72.3 kg)  06/18/17 159 lb (72.1 kg)    Physical Exam  Constitutional: He is oriented to person, place, and time. He appears well-developed and well-nourished. He appears distressed.  HENT:  Head: Normocephalic and atraumatic.  Right Ear: Tympanic membrane normal. No drainage. No middle ear effusion.  Left Ear: Tympanic membrane normal. No drainage.  No middle ear effusion.  Nose: Mucosal edema and rhinorrhea present. Right sinus exhibits no maxillary sinus tenderness. Left sinus exhibits no maxillary sinus tenderness.  Mouth/Throat: Uvula is midline. Posterior oropharyngeal erythema present. No oropharyngeal exudate.  Eyes: Conjunctivae and EOM are normal. Pupils are equal, round, and reactive to light. Right eye exhibits no discharge. Left eye exhibits no discharge.  Neck: Normal range of motion.  Cardiovascular: Normal rate, regular rhythm and normal heart sounds.  Pulmonary/Chest: Effort normal and breath sounds normal. No respiratory distress. He has no  wheezes.  Abdominal: Soft.  Lymphadenopathy:    He has no cervical adenopathy.  Neurological: He is alert and oriented to person, place, and time.  Skin: Skin is warm and dry.  Psychiatric: He has a normal mood and affect. His behavior is normal.  Nursing note and vitals reviewed.   Results for orders placed or performed in visit on 09/28/17  Veritor Flu A/B Waived  Result Value Ref Range   Influenza A Positive (A) Negative   Influenza B Negative Negative      Assessment & Plan:   1. Flu-like symptoms - Veritor Flu A/B Waived  2. Influenza A -  oseltamivir (TAMIFLU) 75 MG capsule; Take 1 capsule (75 mg total) by mouth 2 (two) times daily.  Dispense: 10 capsule; Refill: 0   Continue all other maintenance medications as listed above.  Follow up plan: Return if symptoms worsen or fail to improve.  Educational handout given for [place] PA-C [place] [address]  [place], [place] [address] [phone]   09/29/2017, 9:33 AM

## 2017-10-03 ENCOUNTER — Telehealth: Payer: Self-pay | Admitting: Internal Medicine

## 2017-10-03 NOTE — Telephone Encounter (Signed)
Pt's wife called to cancel patient's colonoscopy with [facility] on 3/6. They both have the flu. She said someone could call them back to reschedule. [phone]

## 2017-10-05 ENCOUNTER — Ambulatory Visit (INDEPENDENT_AMBULATORY_CARE_PROVIDER_SITE_OTHER): Payer: Medicare Other | Admitting: Family Medicine

## 2017-10-05 ENCOUNTER — Encounter: Payer: Self-pay | Admitting: Family Medicine

## 2017-10-05 VITALS — BP 154/77 | HR 67 | Temp 99.7°F | Ht 66.0 in | Wt 155.0 lb

## 2017-10-05 DIAGNOSIS — J441 Chronic obstructive pulmonary disease with (acute) exacerbation: Secondary | ICD-10-CM

## 2017-10-05 DIAGNOSIS — J101 Influenza due to other identified influenza virus with other respiratory manifestations: Secondary | ICD-10-CM | POA: Diagnosis not present

## 2017-10-05 MED ORDER — PREDNISONE 20 MG PO TABS: 40.0000 mg | ORAL_TABLET | Freq: Every day | ORAL | 0 refills | Status: AC

## 2017-10-05 MED ORDER — IPRATROPIUM-ALBUTEROL 0.5-2.5 (3) MG/3ML IN SOLN
3.0000 mL | Freq: Once | RESPIRATORY_TRACT | Status: AC
  Administered 2017-10-05: 3 mL via RESPIRATORY_TRACT

## 2017-10-05 MED ORDER — DOXYCYCLINE HYCLATE 100 MG PO TABS: 100.0000 mg | ORAL_TABLET | Freq: Two times a day (BID) | ORAL | 0 refills | Status: DC

## 2017-10-05 MED ORDER — METHYLPREDNISOLONE ACETATE 80 MG/ML IJ SUSP
80.0000 mg | Freq: Once | INTRAMUSCULAR | Status: AC
Start: 2017-10-05 — End: 2017-10-05
  Administered 2017-10-05: 80 mg via INTRAMUSCULAR

## 2017-10-05 NOTE — Telephone Encounter (Signed)
Tried to call pts wife- [name]- NA-LMOM

## 2017-10-05 NOTE — Telephone Encounter (Signed)
Spoke with the pts wife, cancelled 10/10/17 appt and moved him to 11/21/17 at 10:30. Called and informed Hoyle Sauer. Advised wife to not mix up prep yet and that I would re-do his instructions and mail to them.

## 2017-10-05 NOTE — Patient Instructions (Addendum)
You had a DuoNeb administered today.  I would like you to continue using your albuterol inhaler 2 puffs every 4 hours for the next 2 days.  You should start the prednisone tomorrow and continue it daily with breakfast for the next 5 days.  I have also started you on an antibiotic to take twice a day for the next 10 days to cover your lungs against bacterial infection given your history of COPD.  If your symptoms worsen, please seek immediate medical attention in the emergency department.   Chronic Obstructive Pulmonary Disease Exacerbation Chronic obstructive pulmonary disease (COPD) is a common lung problem. In COPD, the flow of air from the lungs is limited. COPD exacerbations are times that breathing gets worse and you need extra treatment. Without treatment they can be life threatening. If they happen often, your lungs can become more damaged. If your COPD gets worse, your doctor may treat you with:  Medicines.  Oxygen.  Different ways to clear your airway, such as using a mask.  Follow these instructions at home:  Do not smoke.  Avoid tobacco smoke and other things that bother your lungs.  If given, take your antibiotic medicine as told. Finish the medicine even if you start to feel better.  Only take medicines as told by your doctor.  Drink enough fluids to keep your pee (urine) clear or pale yellow (unless your doctor has told you not to).  Use a cool mist machine (vaporizer).  If you use oxygen or a machine that turns liquid medicine into a mist (nebulizer), continue to use them as told.  Keep up with shots (vaccinations) as told by your doctor.  Exercise regularly.  Eat healthy foods.  Keep all doctor visits as told. Get help right away if:  You are very short of breath and it gets worse.  You have trouble talking.  You have bad chest pain.  You have blood in your spit (sputum).  You have a fever.  You keep throwing up (vomiting).  You feel weak, or you pass  out (faint).  You feel confused.  You keep getting worse. This information is not intended to replace advice given to you by your health care provider. Make sure you discuss any questions you have with your health care provider. Document Released: 07/13/2011 Document Revised: 12/30/2015 Document Reviewed: 03/28/2013 Elsevier Interactive Patient Education  2017 [employer].

## 2017-10-05 NOTE — Progress Notes (Signed)
Subjective: CC: Flu PCP: Eustaquio Maize, MD HYI:FOYDX L [name] is a 73 y.o. male presenting to clinic today for:  1. Flu  Patient was diagnosed and treated for influenza A on 09/28/2017.  He presents to office today because he has had worsening productive cough and shortness of breath.  Patient denies hemoptysis or fever since onset of symptoms his wife accompanies him and notes that they did not start the Tamiflu until a couple of days after their visit with [name], secondary to cost.. Patient is a current every day smoker.  He reports compliance with his home inhalers and reports he is actually been using the albuterol inhaler every 6 hours with some improvement in symptoms but no resolution.  He and his wife have been using Robitussin-DM with little improvement.   ROS: Per HPI  No Known Allergies Past Medical History:  Diagnosis Date  . Anginal pain ([place])   . CAD (coronary artery disease)    a. s/p INF MI 1997;  b. s/p CABG 2001;  c. [place] 11/2009:  3v CAD, S-PDA ok with 40% mid, S-OM ok, S-Dx ok, L-LAD ok, EF 50%;  d.  [place] 5/14:  Inferolateral scar, EF 46%, no ischemia  . COPD (chronic obstructive pulmonary disease) ([place])   . Dyspnea   . ETOH abuse   . GERD (gastroesophageal reflux disease)   . Hypercholesterolemia   . Hypertension   . Myocardial infarction ([place])   . Prostate cancer ([place])   . Tobacco abuse     Current Outpatient Medications:  .  acetaminophen (TYLENOL) 500 MG tablet, Take 1,000 mg by mouth every 6 (six) hours as needed for moderate pain or headache., Disp: , Rfl:  .  albuterol (VENTOLIN HFA) 108 (90 Base) MCG/ACT inhaler, INHALE TWO PUFFS INTO THE LUNGS EVERY 6 HOURS AS NEEDED FOR  WHEEZING  OR  SHORTNESS  OF  BREATH, Disp: 9 each, Rfl: 2 .  allopurinol (ZYLOPRIM) 100 MG tablet, Take 1 tablet (100 mg total) by mouth daily., Disp: 90 tablet, Rfl: 1 .  amLODipine (NORVASC) 5 MG tablet, Take 1 tablet (5 mg total) daily by mouth., Disp: 90 tablet, Rfl: 3 .   aspirin 81 MG tablet, Take 81 mg by mouth at bedtime. , Disp: , Rfl:  .  atorvastatin (LIPITOR) 80 MG tablet, Take 1 tablet (80 mg total) daily at 6 PM by mouth., Disp: 90 tablet, Rfl: 3 .  colchicine 0.6 MG tablet, Take 2 the first day of flare, then one daily until flare resolved (Patient taking differently: Take 1.2 mg by mouth daily as needed (for gout flare up). ), Disp: 30 tablet, Rfl: 2 .  fluticasone (FLONASE) 50 MCG/ACT nasal spray, Place 2 sprays into both nostrils daily. (Patient taking differently: Place 2 sprays into both nostrils daily as needed for allergies. ), Disp: 16 g, Rfl: 6 .  gabapentin (NEURONTIN) 300 MG capsule, Take 2 capsules (600 mg total) by mouth 3 (three) times daily., Disp: 180 capsule, Rfl: 5 .  ibuprofen (ADVIL,MOTRIN) 200 MG tablet, Take 800 mg by mouth every 6 (six) hours as needed for headache or moderate pain., Disp: , Rfl:  .  metoprolol tartrate (LOPRESSOR) 25 MG tablet, Take 0.5 tablets (12.5 mg total) 2 (two) times daily by mouth., Disp: 90 tablet, Rfl: 3 .  nitroGLYCERIN (NITROSTAT) 0.4 MG SL tablet, Place 1 tablet (0.4 mg total) under the tongue every 5 (five) minutes as needed for chest pain., Disp: 20 tablet, Rfl: 3 .  omega-3 acid ethyl esters (LOVAZA) 1 g capsule, TAKE 1 CAPSULE BY MOUTH TWICE DAILY. PATIENT NEEDS APPOINTMENT FOR REFILLS. (Patient taking differently: Take 1 g by mouth 2 (two) times daily. ), Disp: 180 capsule, Rfl: 1 .  omeprazole (PRILOSEC) 20 MG capsule, Take 20 mg by mouth daily., Disp: , Rfl:  .  oseltamivir (TAMIFLU) 75 MG capsule, Take 1 capsule (75 mg total) by mouth 2 (two) times daily. (Patient not taking: Reported on 10/01/2017), Disp: 10 capsule, Rfl: 0 .  polyethylene glycol-electrolytes (TRILYTE) 420 g solution, Take 4,000 mLs by mouth as directed., Disp: 4000 mL, Rfl: 0 .  tamsulosin (FLOMAX) 0.4 MG CAPS capsule, Take 1 capsule (0.4 mg total) by mouth daily., Disp: 90 capsule, Rfl: 1 .  tiotropium (SPIRIVA) 18 MCG inhalation  capsule, Place 1 capsule (18 mcg total) into inhaler and inhale daily., Disp: 90 capsule, Rfl: 3 Social History   Socioeconomic History  . Marital status: Married    Spouse name: Not on file  . Number of children: Not on file  . Years of education: Not on file  . Highest education level: Not on file  Social Needs  . Financial resource strain: Not on file  . Food insecurity - worry: Not on file  . Food insecurity - inability: Not on file  . Transportation needs - medical: Not on file  . Transportation needs - non-medical: Not on file  Occupational History  . Not on file  Tobacco Use  . Smoking status: Current Every Day Smoker    Packs/day: 0.50    Years: 50.00    Pack years: 25.00    Types: Cigarettes, E-cigarettes    Start date: 03/08/1970  . Smokeless tobacco: Current User    Types: Snuff    Last attempt to quit: 03/08/2012  Substance and Sexual Activity  . Alcohol use: Yes    Alcohol/week: 0.0 oz    Comment: occasionally  . Drug use: No  . Sexual activity: Yes    Partners: Female  Other Topics Concern  . Not on file  Social History Narrative  . Not on file   Family History  Problem Relation Age of Onset  . Diabetes Mother   . Hypertension Father   . Heart disease Brother   . Diabetes Brother   . Heart disease Brother   . Diabetes Brother   . Multiple sclerosis Daughter   . Healthy Son   . Colon cancer Neg Hx     Objective: Office vital signs reviewed. BP (!) 154/77   Pulse 67   Temp 99.7 F (37.6 C) (Oral)   Ht 5\' 6"  (1.676 m)   Wt 155 lb (70.3 kg)   SpO2 97%   BMI 25.02 kg/m   Physical Examination:  General: Awake, alert, well nourished, nontoxic, No acute distress HEENT: MMM, sclera white Cardio: regular rate and rhythm, S1S2 heard, no murmurs appreciated Pulm: Global expiratory wheeze noted.  He has fair air movement.  No rhonchi or rales noted.  Normal work of breathing on room air.  Intimately coughing.  Assessment/ Plan: 72 y.o. male   1.  COPD exacerbation ([facility]) Given productive cough, known fevers and recent infection with/current infection with influenza, will treat as COPD exacerbation.  He was given a dose of Depo-Medrol 80 mg IM.  He will start prednisone 40 mg daily for 5 days tomorrow.  He was also prescribed doxycycline 100 mg p.o. twice daily.  I recommended that he increase his albuterol use 2 puffs every  4 hours for the next 2 days then resume as needed as directed use.  He was given a DuoNeb inhalation treatment with improved air movement and reduced expiratory wheeze, but expiratory wheeze was still noted after DuoNeb.  Patient was good understanding of home care instructions.  Return precautions and reasons for emergent evaluation in the emergency department discussed.  He voiced good understanding will follow-up as needed. - ipratropium-albuterol (DUONEB) 0.5-2.5 (3) MG/3ML nebulizer solution 3 mL - methylPREDNISolone acetate (DEPO-MEDROL) injection 80 mg  2. Influenza A - ipratropium-albuterol (DUONEB) 0.5-2.5 (3) MG/3ML nebulizer solution 3 mL  Meds ordered this encounter  Medications  . ipratropium-albuterol (DUONEB) 0.5-2.5 (3) MG/3ML nebulizer solution 3 mL  . doxycycline (VIBRA-TABS) 100 MG tablet    Sig: Take 1 tablet (100 mg total) by mouth 2 (two) times daily.    Dispense:  20 tablet    Refill:  0  . predniSONE (DELTASONE) 20 MG tablet    Sig: Take 2 tablets (40 mg total) by mouth daily with breakfast for 5 days.    Dispense:  10 tablet    Refill:  0  . methylPREDNISolone acetate (DEPO-MEDROL) injection 80 mg     Janora Norlander, DO [place] [phone]

## 2017-10-09 NOTE — Progress Notes (Signed)
Pt had to change day of procedure d/t having the flu. I have changed his instructions and mailed them to him. See phone note.

## 2017-11-21 ENCOUNTER — Encounter (HOSPITAL_COMMUNITY): Payer: Self-pay | Admitting: *Deleted

## 2017-11-21 ENCOUNTER — Encounter (HOSPITAL_COMMUNITY)
Admission: RE | Disposition: A | Discharge status: Home Health | Payer: Self-pay | Source: Ambulatory Visit | Attending: Internal Medicine

## 2017-11-21 ENCOUNTER — Other Ambulatory Visit: Payer: Self-pay

## 2017-11-21 ENCOUNTER — Ambulatory Visit (HOSPITAL_COMMUNITY)
Admission: RE | Admit: 2017-11-21 | Discharge: 2017-11-21 | Disposition: A | Discharge status: Home Health | Payer: Medicare Other | Source: Ambulatory Visit | Attending: Internal Medicine | Admitting: Internal Medicine

## 2017-11-21 DIAGNOSIS — Z8546 Personal history of malignant neoplasm of prostate: Secondary | ICD-10-CM | POA: Diagnosis not present

## 2017-11-21 DIAGNOSIS — Z1212 Encounter for screening for malignant neoplasm of rectum: Secondary | ICD-10-CM

## 2017-11-21 DIAGNOSIS — I2510 Atherosclerotic heart disease of native coronary artery without angina pectoris: Secondary | ICD-10-CM | POA: Insufficient documentation

## 2017-11-21 DIAGNOSIS — E7800 Pure hypercholesterolemia, unspecified: Secondary | ICD-10-CM | POA: Diagnosis not present

## 2017-11-21 DIAGNOSIS — Z79899 Other long term (current) drug therapy: Secondary | ICD-10-CM | POA: Insufficient documentation

## 2017-11-21 DIAGNOSIS — K640 First degree hemorrhoids: Secondary | ICD-10-CM | POA: Diagnosis not present

## 2017-11-21 DIAGNOSIS — Z1211 Encounter for screening for malignant neoplasm of colon: Secondary | ICD-10-CM | POA: Insufficient documentation

## 2017-11-21 DIAGNOSIS — F17210 Nicotine dependence, cigarettes, uncomplicated: Secondary | ICD-10-CM | POA: Insufficient documentation

## 2017-11-21 DIAGNOSIS — D128 Benign neoplasm of rectum: Secondary | ICD-10-CM | POA: Diagnosis not present

## 2017-11-21 DIAGNOSIS — J449 Chronic obstructive pulmonary disease, unspecified: Secondary | ICD-10-CM | POA: Diagnosis not present

## 2017-11-21 DIAGNOSIS — K621 Rectal polyp: Secondary | ICD-10-CM | POA: Diagnosis not present

## 2017-11-21 DIAGNOSIS — D123 Benign neoplasm of transverse colon: Secondary | ICD-10-CM

## 2017-11-21 DIAGNOSIS — I10 Essential (primary) hypertension: Secondary | ICD-10-CM | POA: Insufficient documentation

## 2017-11-21 DIAGNOSIS — Z951 Presence of aortocoronary bypass graft: Secondary | ICD-10-CM | POA: Diagnosis not present

## 2017-11-21 DIAGNOSIS — I252 Old myocardial infarction: Secondary | ICD-10-CM | POA: Diagnosis not present

## 2017-11-21 DIAGNOSIS — Z7982 Long term (current) use of aspirin: Secondary | ICD-10-CM | POA: Diagnosis not present

## 2017-11-21 HISTORY — PX: POLYPECTOMY: SHX5525

## 2017-11-21 HISTORY — PX: COLONOSCOPY: SHX5424

## 2017-11-21 SURGERY — COLONOSCOPY
Anesthesia: Moderate Sedation

## 2017-11-21 MED ORDER — SODIUM CHLORIDE 0.9 % IV SOLN
INTRAVENOUS | Status: DC
  Administered 2017-11-21: 1000 mL via INTRAVENOUS

## 2017-11-21 MED ORDER — MIDAZOLAM HCL 5 MG/5ML IJ SOLN
INTRAMUSCULAR | Status: AC
  Filled 2017-11-21: qty 10

## 2017-11-21 MED ORDER — MIDAZOLAM HCL 5 MG/5ML IJ SOLN
INTRAMUSCULAR | Status: DC | PRN
  Administered 2017-11-21: 2 mg via INTRAVENOUS
  Administered 2017-11-21: 1 mg via INTRAVENOUS

## 2017-11-21 MED ORDER — MEPERIDINE HCL 100 MG/ML IJ SOLN
INTRAMUSCULAR | Status: AC
  Filled 2017-11-21: qty 2

## 2017-11-21 MED ORDER — MEPERIDINE HCL 100 MG/ML IJ SOLN
INTRAMUSCULAR | Status: DC | PRN
  Administered 2017-11-21: 50 mg via INTRAVENOUS

## 2017-11-21 MED ORDER — ONDANSETRON HCL 4 MG/2ML IJ SOLN
INTRAMUSCULAR | Status: AC
  Filled 2017-11-21: qty 2

## 2017-11-21 MED ORDER — STERILE WATER FOR IRRIGATION IR SOLN
Status: DC | PRN
  Administered 2017-11-21: 10:00:00

## 2017-11-21 MED ORDER — ONDANSETRON HCL 4 MG/2ML IJ SOLN
INTRAMUSCULAR | Status: DC | PRN
  Administered 2017-11-21: 4 mg via INTRAVENOUS

## 2017-11-21 NOTE — Op Note (Signed)
[facility] Patient Name: [name] Procedure Date: 11/21/2017 9:27 AM MRN: [id number] Date of Birth: [birth year] Attending MD: Norvel Richards , MD CSN: [id number] Age: 72 Admit Type: Outpatient Procedure:                Colonoscopy Indications:              Screening for colorectal malignant neoplasm Providers:                Norvel Richards, MD, Ralph Peak B. Gwenlyn Perking RN, RN,                            Aram Candela Referring MD:              Medicines:                Meperidine 50 mg IV, Midazolam 3 mg IV, Ondansetron                            4 mg IV Complications:            No immediate complications. Estimated Blood Loss:     Estimated blood loss was minimal. Procedure:                Pre-Anesthesia Assessment:                           - Prior to the procedure, a History and Physical                            was performed, and patient medications and                            allergies were reviewed. The patient's tolerance of                            previous anesthesia was also reviewed. The risks                            and benefits of the procedure and the sedation                            options and risks were discussed with the patient.                            All questions were answered, and informed consent                            was obtained. Prior Anticoagulants: The patient has                            taken no previous anticoagulant or antiplatelet                            agents. ASA Grade Assessment: II - A patient with  mild systemic disease. After reviewing the risks                            and benefits, the patient was deemed in                            satisfactory condition to undergo the procedure.                           After obtaining informed consent, the colonoscope                            was passed under direct vision. Throughout the                            procedure, the  patient's blood pressure, pulse, and                            oxygen saturations were monitored continuously. The                            EC-[id number] ([id number]) scope was introduced through                            the anus and advanced to the the cecum, identified                            by appendiceal orifice and ileocecal valve. The                            colonoscopy was performed without difficulty. The                            patient tolerated the procedure well. The quality                            of the bowel preparation was adequate. The                            ileocecal valve, appendiceal orifice, and rectum                            were photographed. The entire colon was well                            visualized. Scope In: 10:02:20 AM Scope Out: 10:13:59 AM Scope Withdrawal Time: 0 hours 7 minutes 33 seconds  Total Procedure Duration: 0 hours 11 minutes 39 seconds  Findings:      The perianal and digital rectal examinations were normal.      Three semi-pedunculated polyps were found in the rectum and splenic       flexure. The polyps were 4 to 6 mm in size. These polyps were removed       with a cold snare. Resection and retrieval were complete. Estimated  blood loss was minimal.      Non-bleeding internal hemorrhoids were found during retroflexion. The       hemorrhoids were moderate, medium-sized and Grade I (internal       hemorrhoids that do not prolapse).      The exam was otherwise without abnormality on direct and retroflexion       views. Impression:               - Three 4 to 6 mm polyps in the rectum and at the                            splenic flexure, removed with a cold snare.                            Resected and retrieved.                           - Non-bleeding internal hemorrhoids.                           - The examination was otherwise normal on direct                            and retroflexion views. Moderate Sedation:       Moderate (conscious) sedation was administered by the endoscopy nurse       and supervised by the endoscopist. The following parameters were       monitored: oxygen saturation, heart rate, blood pressure, respiratory       rate, EKG, adequacy of pulmonary ventilation, and response to care.       Total physician intraservice time was 15 minutes. Recommendation:           - Patient has a contact number available for                            emergencies. The signs and symptoms of potential                            delayed complications were discussed with the                            patient. Return to normal activities tomorrow.                            Written discharge instructions were provided to the                            patient.                           - Resume previous diet.                           - Repeat colonoscopy date to be determined after                            pending pathology results  are reviewed for                            surveillance based on pathology results.                           - Return to GI office (date not yet determined). Procedure Code(s):        --- Professional ---                           [phone], Colonoscopy, flexible; with removal of                            tumor(s), polyp(s), or other lesion(s) by snare                            technique                           G0500, Moderate sedation services provided by the                            same physician or other qualified health care                            professional performing a gastrointestinal                            endoscopic service that sedation supports,                            requiring the presence of an independent trained                            observer to assist in the monitoring of the                            patient's level of consciousness and physiological                            status; initial 15 minutes of intra-service time;                             patient age 53 years or older (additional time may                            be reported with [phone], as appropriate) Diagnosis Code(s):        --- Professional ---                           Z12.11, Encounter for screening for malignant                            neoplasm of colon  K62.1, Rectal polyp                           D12.3, Benign neoplasm of transverse colon (hepatic                            flexure or splenic flexure)                           K64.0, First degree hemorrhoids CPT copyright 2017 [employer]. All rights reserved. The codes documented in this report are preliminary and upon coder review may  be revised to meet current compliance requirements. Cristopher Estimable. Derhonda Eastlick, MD Norvel Richards, MD 11/21/2017 10:19:19 AM This report has been signed electronically. Number of Addenda: 0

## 2017-11-21 NOTE — Discharge Instructions (Signed)
°Colonoscopy °Discharge Instructions ° °Read the instructions outlined below and refer to this sheet in the next few weeks. These discharge instructions provide you with general information on caring for yourself after you leave the hospital. Your doctor may also give you specific instructions. While your treatment has been planned according to the most current medical practices available, unavoidable complications occasionally occur. If you have any problems or questions after discharge, call Dr. Rourk at [phone]. °ACTIVITY °· You may resume your regular activity, but move at a slower pace for the next 24 hours.  °· Take frequent rest periods for the next 24 hours.  °· Walking will help get rid of the air and reduce the bloated feeling in your belly (abdomen).  °· No driving for 24 hours (because of the medicine (anesthesia) used during the test).   °· Do not sign any important legal documents or operate any machinery for 24 hours (because of the anesthesia used during the test).  °NUTRITION °· Drink plenty of fluids.  °· You may resume your normal diet as instructed by your doctor.  °· Begin with a light meal and progress to your normal diet. Heavy or fried foods are harder to digest and may make you feel sick to your stomach (nauseated).  °· Avoid alcoholic beverages for 24 hours or as instructed.  °MEDICATIONS °· You may resume your normal medications unless your doctor tells you otherwise.  °WHAT YOU CAN EXPECT TODAY °· Some feelings of bloating in the abdomen.  °· Passage of more gas than usual.  °· Spotting of blood in your stool or on the toilet paper.  °IF YOU HAD POLYPS REMOVED DURING THE COLONOSCOPY: °· No aspirin products for 7 days or as instructed.  °· No alcohol for 7 days or as instructed.  °· Eat a soft diet for the next 24 hours.  °FINDING OUT THE RESULTS OF YOUR TEST °Not all test results are available during your visit. If your test results are not back during the visit, make an appointment  with your caregiver to find out the results. Do not assume everything is normal if you have not heard from your caregiver or the medical facility. It is important for you to follow up on all of your test results.  °SEEK IMMEDIATE MEDICAL ATTENTION IF: °· You have more than a spotting of blood in your stool.  °· Your belly is swollen (abdominal distention).  °· You are nauseated or vomiting.  °· You have a temperature over 101.  °· You have abdominal pain or discomfort that is severe or gets worse throughout the day.  ° ° °Colon polyp information provided ° ° °Further recommendations to follow pending review of pathology report ° ° °Colon Polyps °Polyps are tissue growths inside the body. Polyps can grow in many places, including the large intestine (colon). A polyp may be a round bump or a mushroom-shaped growth. You could have one polyp or several. °Most colon polyps are noncancerous (benign). However, some colon polyps can become cancerous over time. °What are the causes? °The exact cause of colon polyps is not known. °What increases the risk? °This condition is more likely to develop in people who: °· Have a family history of colon cancer or colon polyps. °· Are older than 50 or older than 45 if they are African American. °· Have inflammatory bowel disease, such as ulcerative colitis or Crohn disease. °· Are overweight. °· Smoke cigarettes. °· Do not get enough exercise. °· Drink too much   alcohol. °· Eat a diet that is: °? High in fat and red meat. °? Low in fiber. °· Had childhood cancer that was treated with abdominal radiation. ° °What are the signs or symptoms? °Most polyps do not cause symptoms. If you have symptoms, they may include: °· Blood coming from your rectum when having a bowel movement. °· Blood in your stool. The stool may look dark red or black. °· A change in bowel habits, such as constipation or diarrhea. ° °How is this diagnosed? °This condition is diagnosed with a colonoscopy. This is a  procedure that uses a lighted, flexible scope to look at the inside of your colon. °How is this treated? °Treatment for this condition involves removing any polyps that are found. Those polyps will then be tested for cancer. If cancer is found, your health care provider will talk to you about options for colon cancer treatment. °Follow these instructions at home: °Diet °· Eat plenty of fiber, such as fruits, vegetables, and whole grains. °· Eat foods that are high in calcium and vitamin D, such as milk, cheese, yogurt, eggs, liver, fish, and broccoli. °· Limit foods high in fat, red meats, and processed meats, such as hot dogs, sausage, bacon, and lunch meats. °· Maintain a healthy weight, or lose weight if recommended by your health care provider. °General instructions °· Do not smoke cigarettes. °· Do not drink alcohol excessively. °· Keep all follow-up visits as told by your health care provider. This is important. This includes keeping regularly scheduled colonoscopies. Talk to your health care provider about when you need a colonoscopy. °· Exercise every day or as told by your health care provider. °Contact a health care provider if: °· You have new or worsening bleeding during a bowel movement. °· You have new or increased blood in your stool. °· You have a change in bowel habits. °· You unexpectedly lose weight. °This information is not intended to replace advice given to you by your health care provider. Make sure you discuss any questions you have with your health care provider. ° °

## 2017-11-21 NOTE — H&P (Signed)
@LOGO @   Primary Care Physician:  Eustaquio Maize, MD Primary Gastroenterologist:  Dr. Gala Romney  Pre-Procedure History & Physical: HPI:  [name] is a 72 y.o. male is here for a screening colonoscopy. Negative colonoscopy Dr. Sharlett Iles many years ago. No bowel symptoms. No family history of colon cancer.  Past Medical History:  Diagnosis Date  . Anginal pain ([place])   . CAD (coronary artery disease)    a. s/p INF MI 1997;  b. s/p CABG 2001;  c. [place] 11/2009:  3v CAD, S-PDA ok with 40% mid, S-OM ok, S-Dx ok, L-LAD ok, EF 50%;  d.  [facility] 5/14:  Inferolateral scar, EF 46%, no ischemia  . COPD (chronic obstructive pulmonary disease) ([place])   . Dyspnea   . ETOH abuse   . GERD (gastroesophageal reflux disease)   . Hypercholesterolemia   . Hypertension   . Myocardial infarction ([place])   . Prostate cancer ([place])   . Tobacco abuse     Past Surgical History:  Procedure Laterality Date  . ANTERIOR CERVICAL DECOMPRESSION/DISCECTOMY FUSION 4 LEVELS N/A 07/21/2016   Procedure: ANTERIOR CERVICAL DECOMPRESSION/DISCECTOMY FUSION CERVICAL TWO-THREE, CERVICAL THREE-FOUR,. CERVICAL FOUR-FIVE, CERVICAL  FIVE-SIX;  Surgeon: Ashok Pall, MD;  Location: [facility];  Service: Neurosurgery;  Laterality: N/A;  . BACK SURGERY    . CARDIAC CATHETERIZATION  2011  . CARDIAC SURGERY     [facility]  . COLONOSCOPY     in remote past, Dr. Sharlett Iles. Obtaining records.   . CORONARY ANGIOPLASTY    . CORONARY ARTERY BYPASS GRAFT    . ESOPHAGOGASTRODUODENOSCOPY  05/27/2012   GXQ:JJHERD esophagus, stomach and duodenum s/p dilator  . PROSTATE SURGERY      Prior to Admission medications   Medication Sig Start Date End Date Taking? Authorizing Provider  acetaminophen (TYLENOL) 500 MG tablet Take 1,000 mg by mouth every 6 (six) hours as needed for moderate pain or headache.   Yes [provider]  allopurinol (ZYLOPRIM) 100 MG tablet Take 1 tablet (100 mg total) by mouth daily. 07/23/17  Yes Eustaquio Maize, MD   amLODipine (NORVASC) 5 MG tablet Take 1 tablet (5 mg total) daily by mouth. 06/18/17  Yes Branch, Alphonse Guild, MD  aspirin 81 MG tablet Take 81 mg by mouth at bedtime.    Yes [provider]  atorvastatin (LIPITOR) 80 MG tablet Take 1 tablet (80 mg total) daily at 6 PM by mouth. 06/18/17  Yes Branch, Alphonse Guild, MD  fluticasone (FLONASE) 50 MCG/ACT nasal spray Place 2 sprays into both nostrils daily. Patient taking differently: Place 2 sprays into both nostrils daily as needed for allergies.  10/26/16  Yes Eustaquio Maize, MD  gabapentin (NEURONTIN) 300 MG capsule Take 2 capsules (600 mg total) by mouth 3 (three) times daily. 07/23/17  Yes Eustaquio Maize, MD  metoprolol tartrate (LOPRESSOR) 25 MG tablet Take 0.5 tablets (12.5 mg total) 2 (two) times daily by mouth. 06/18/17  Yes Branch, Alphonse Guild, MD  omega-3 acid ethyl esters (LOVAZA) 1 g capsule TAKE 1 CAPSULE BY MOUTH TWICE DAILY. PATIENT NEEDS APPOINTMENT FOR REFILLS. Patient taking differently: Take 1 g by mouth 2 (two) times daily.  07/23/17  Yes Eustaquio Maize, MD  omeprazole (PRILOSEC) 20 MG capsule Take 20 mg by mouth daily.   Yes [provider]  polyethylene glycol-electrolytes (TRILYTE) 420 g solution Take 4,000 mLs by mouth as directed. 09/06/17  Yes Annitta Needs, NP  tamsulosin (FLOMAX) 0.4 MG CAPS capsule Take  1 capsule (0.4 mg total) by mouth daily. 07/23/17  Yes Eustaquio Maize, MD  tiotropium (SPIRIVA) 18 MCG inhalation capsule Place 1 capsule (18 mcg total) into inhaler and inhale daily. 07/23/17  Yes Eustaquio Maize, MD  albuterol (VENTOLIN HFA) 108 (90 Base) MCG/ACT inhaler INHALE TWO PUFFS INTO THE LUNGS EVERY 6 HOURS AS NEEDED FOR  WHEEZING  OR  SHORTNESS  OF  BREATH 07/23/17   Eustaquio Maize, MD  colchicine 0.6 MG tablet Take 2 the first day of flare, then one daily until flare resolved Patient taking differently: Take 1.2 mg by mouth daily as needed (for gout flare up).  07/23/17   Eustaquio Maize,  MD  doxycycline (VIBRA-TABS) 100 MG tablet Take 1 tablet (100 mg total) by mouth 2 (two) times daily. 10/05/17   Janora Norlander, DO  ibuprofen (ADVIL,MOTRIN) 200 MG tablet Take 800 mg by mouth every 6 (six) hours as needed for headache or moderate pain.    [provider]  nitroGLYCERIN (NITROSTAT) 0.4 MG SL tablet Place 1 tablet (0.4 mg total) under the tongue every 5 (five) minutes as needed for chest pain. 10/26/16   Eustaquio Maize, MD    Allergies as of 09/06/2017 - Review Complete 09/06/2017  Allergen Reaction Noted  . No known allergies  07/20/2016    Family History  Problem Relation Age of Onset  . Diabetes Mother   . Hypertension Father   . Heart disease Brother   . Diabetes Brother   . Heart disease Brother   . Diabetes Brother   . Multiple sclerosis Daughter   . Healthy Son   . Colon cancer Neg Hx     Social History   Socioeconomic History  . Marital status: Married    Spouse name: Not on file  . Number of children: Not on file  . Years of education: Not on file  . Highest education level: Not on file  Occupational History  . Not on file  Social Needs  . Financial resource strain: Not on file  . Food insecurity:    Worry: Not on file    Inability: Not on file  . Transportation needs:    Medical: Not on file    Non-medical: Not on file  Tobacco Use  . Smoking status: Current Every Day Smoker    Packs/day: 0.50    Years: 50.00    Pack years: 25.00    Types: Cigarettes, E-cigarettes    Start date: 03/08/1970  . Smokeless tobacco: Current User    Types: Snuff    Last attempt to quit: 03/08/2012  Substance and Sexual Activity  . Alcohol use: Yes    Alcohol/week: 0.0 oz    Comment: occasionally  . Drug use: No  . Sexual activity: Yes    Partners: Female  Lifestyle  . Physical activity:    Days per week: Not on file    Minutes per session: Not on file  . Stress: Not on file  Relationships  . Social connections:    Talks on phone: Not on  file    Gets together: Not on file    Attends religious service: Not on file    Active member of club or organization: Not on file    Attends meetings of clubs or organizations: Not on file    Relationship status: Not on file  . Intimate partner violence:    Fear of current or ex partner: Not on file    Emotionally abused:  Not on file    Physically abused: Not on file    Forced sexual activity: Not on file  Other Topics Concern  . Not on file  Social History Narrative  . Not on file    Review of Systems: See HPI, otherwise negative ROS  Physical Exam: There were no vitals taken for this visit. General:   Alert,  Well-developed, well-nourished, pleasant and cooperative in NAD Lungs:  Clear throughout to auscultation.   No wheezes, crackles, or rhonchi. No acute distress. Heart:  Regular rate and rhythm; no murmurs, clicks, rubs,  or gallops. Abdomen:  Soft, nontender and nondistended. No masses, hepatosplenomegaly or hernias noted. Normal bowel sounds, without guarding, and without rebound.   Impression/Plan: [name] is now here to undergo a screening colonoscopy.  I will offer the patient a screening colonoscopy today.  Risks, benefits, limitations, imponderables and alternatives regarding colonoscopy have been reviewed with the patient. Questions have been answered. All parties agreeable.     Notice:  This dictation was prepared with Dragon dictation along with smaller phrase technology. Any transcriptional errors that result from this process are unintentional and may not be corrected upon review.

## 2017-11-25 ENCOUNTER — Encounter: Payer: Self-pay | Admitting: Internal Medicine

## 2017-11-27 ENCOUNTER — Encounter (HOSPITAL_COMMUNITY): Payer: Self-pay | Admitting: Internal Medicine

## 2017-12-17 IMAGING — DX DG FOOT COMPLETE 3+V*L*
3 series · 3 of 3 positions shown · non-contrast
Comparison: None.

CLINICAL DATA: Left foot pain with swelling

EXAM:
LEFT FOOT - COMPLETE 3+ VIEW

[foot ap]
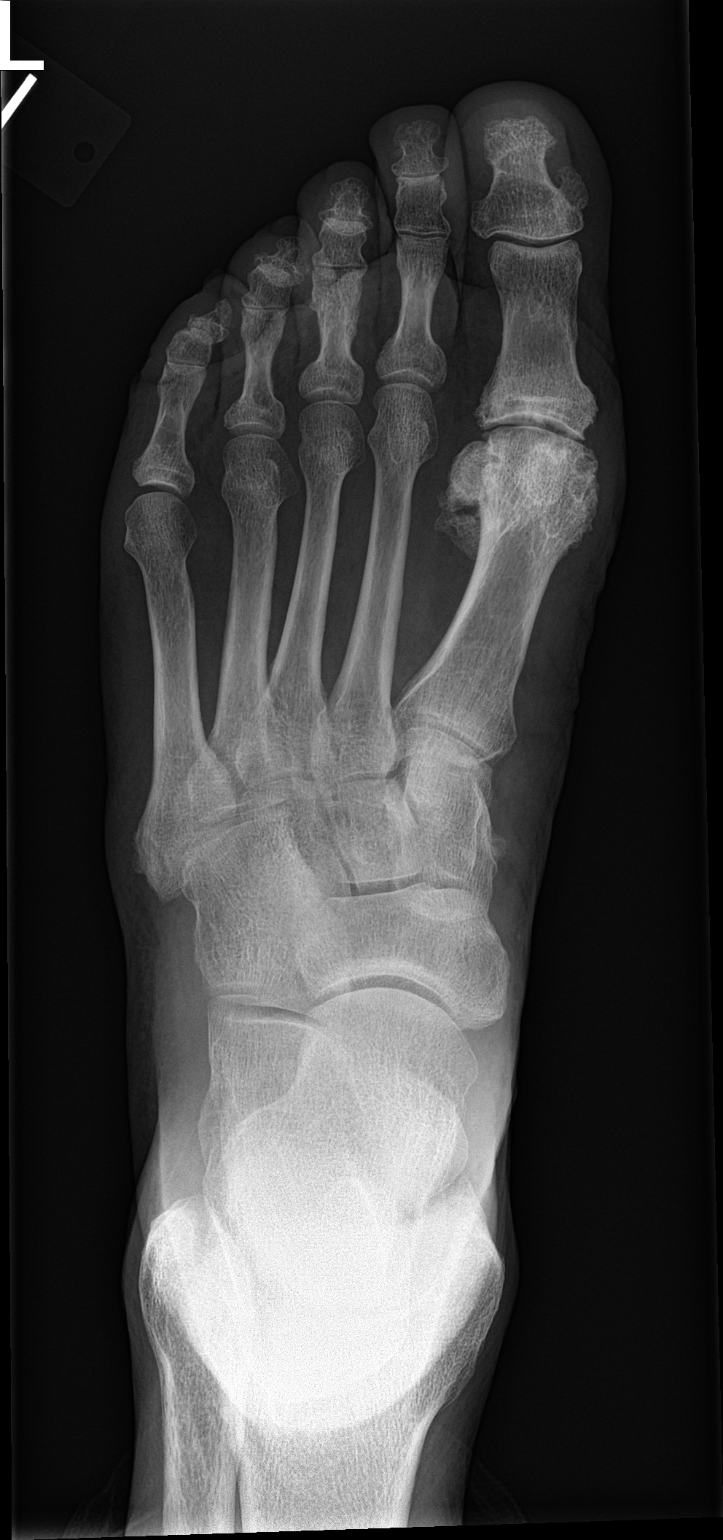

[foot obl]
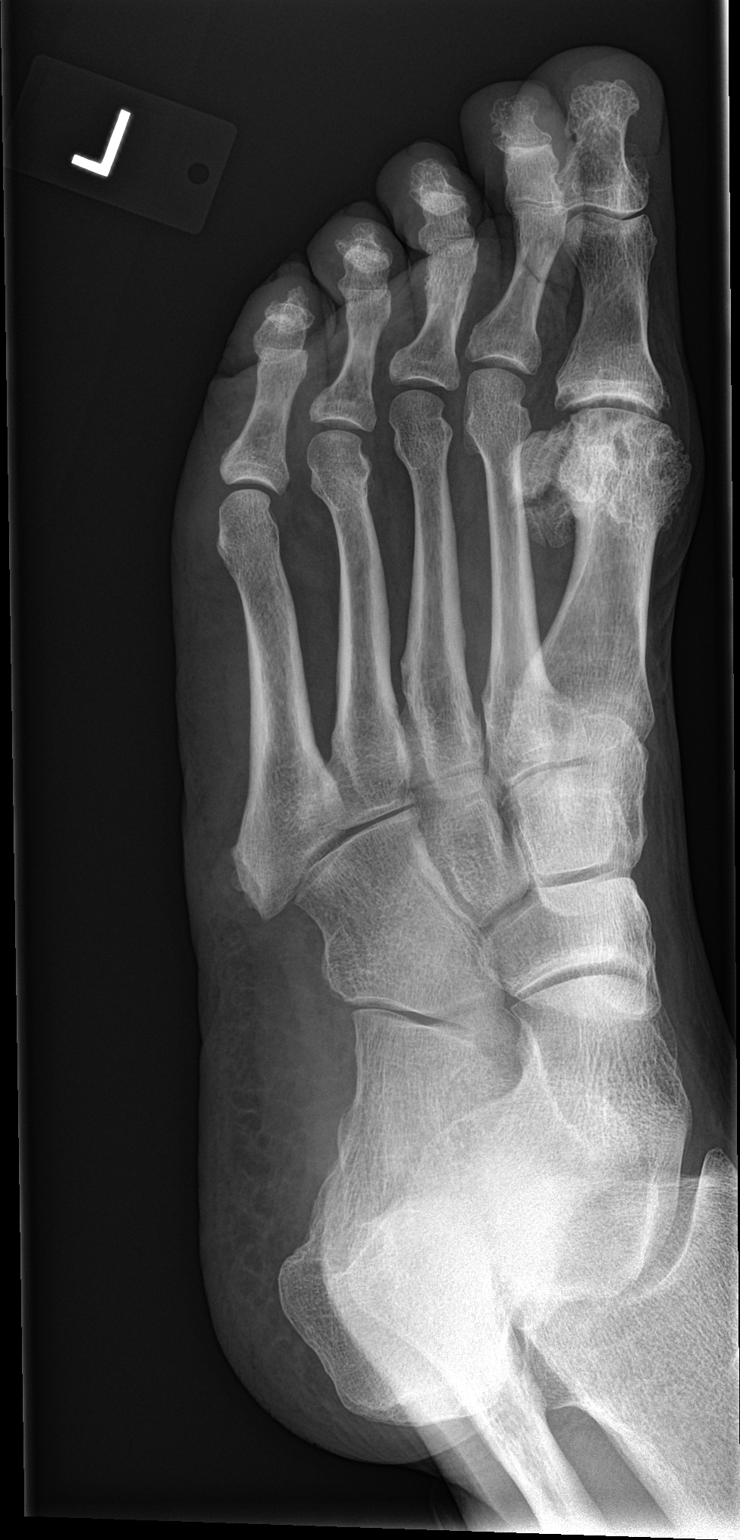

[foot lat]
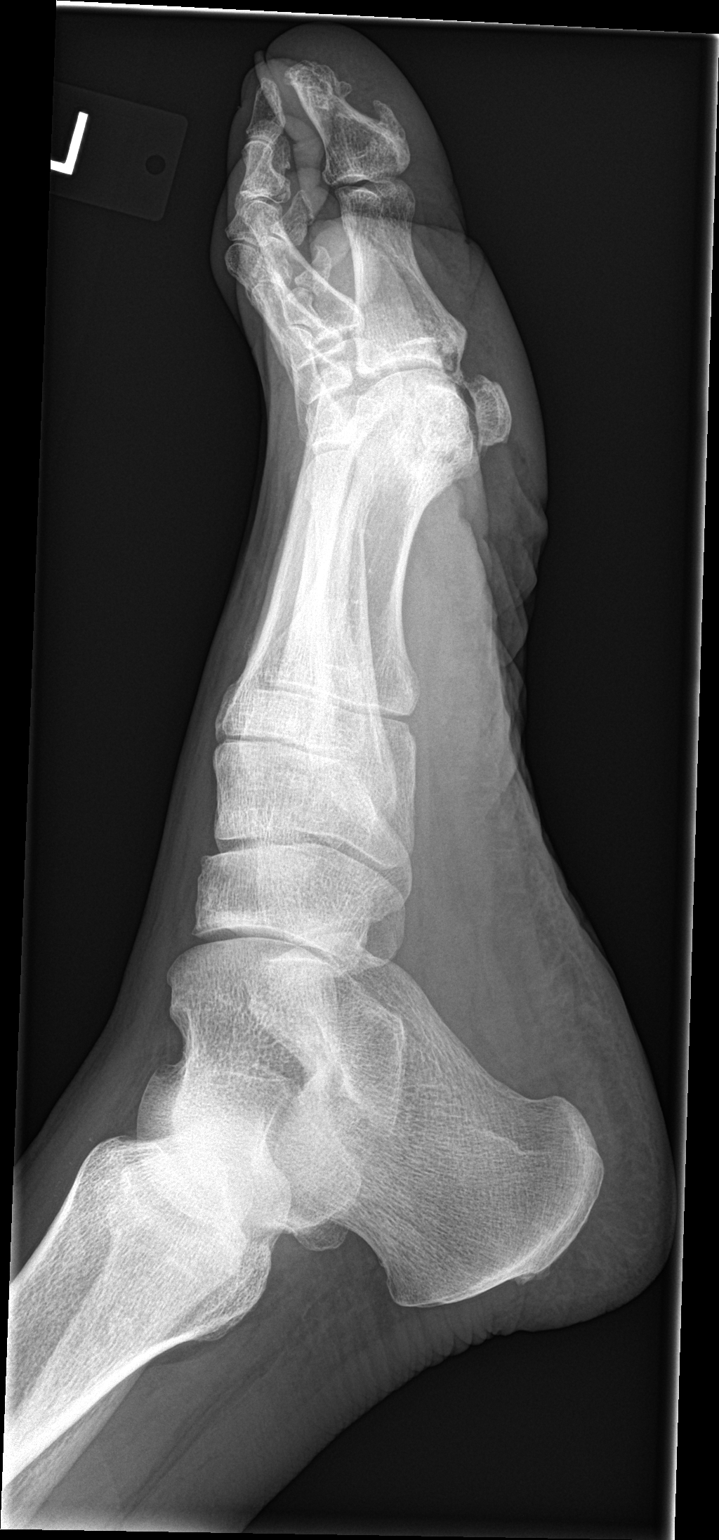

[3 of 3 positions shown; findings below may reference images not displayed]

FINDINGS: No fracture or malalignment. Moderate-to-marked arthritic changes at
the first MTP joint with sclerosis and cyst formation within the
metatarsal head. Small osteophytes. No erosive changes. No calcified
soft tissue masses.
IMPRESSION: 1. No acute osseous abnormality.
2. Moderate-to-marked arthritic changes at the first MTP joint

## 2017-12-20 IMAGING — CR DG CHEST 1V PORT
2 series · 2 of 2 positions shown · non-contrast
Comparison: 07/21/2016

CLINICAL DATA: Ventilator

EXAM:
PORTABLE CHEST 1 VIEW

[AP (1 of 2)]
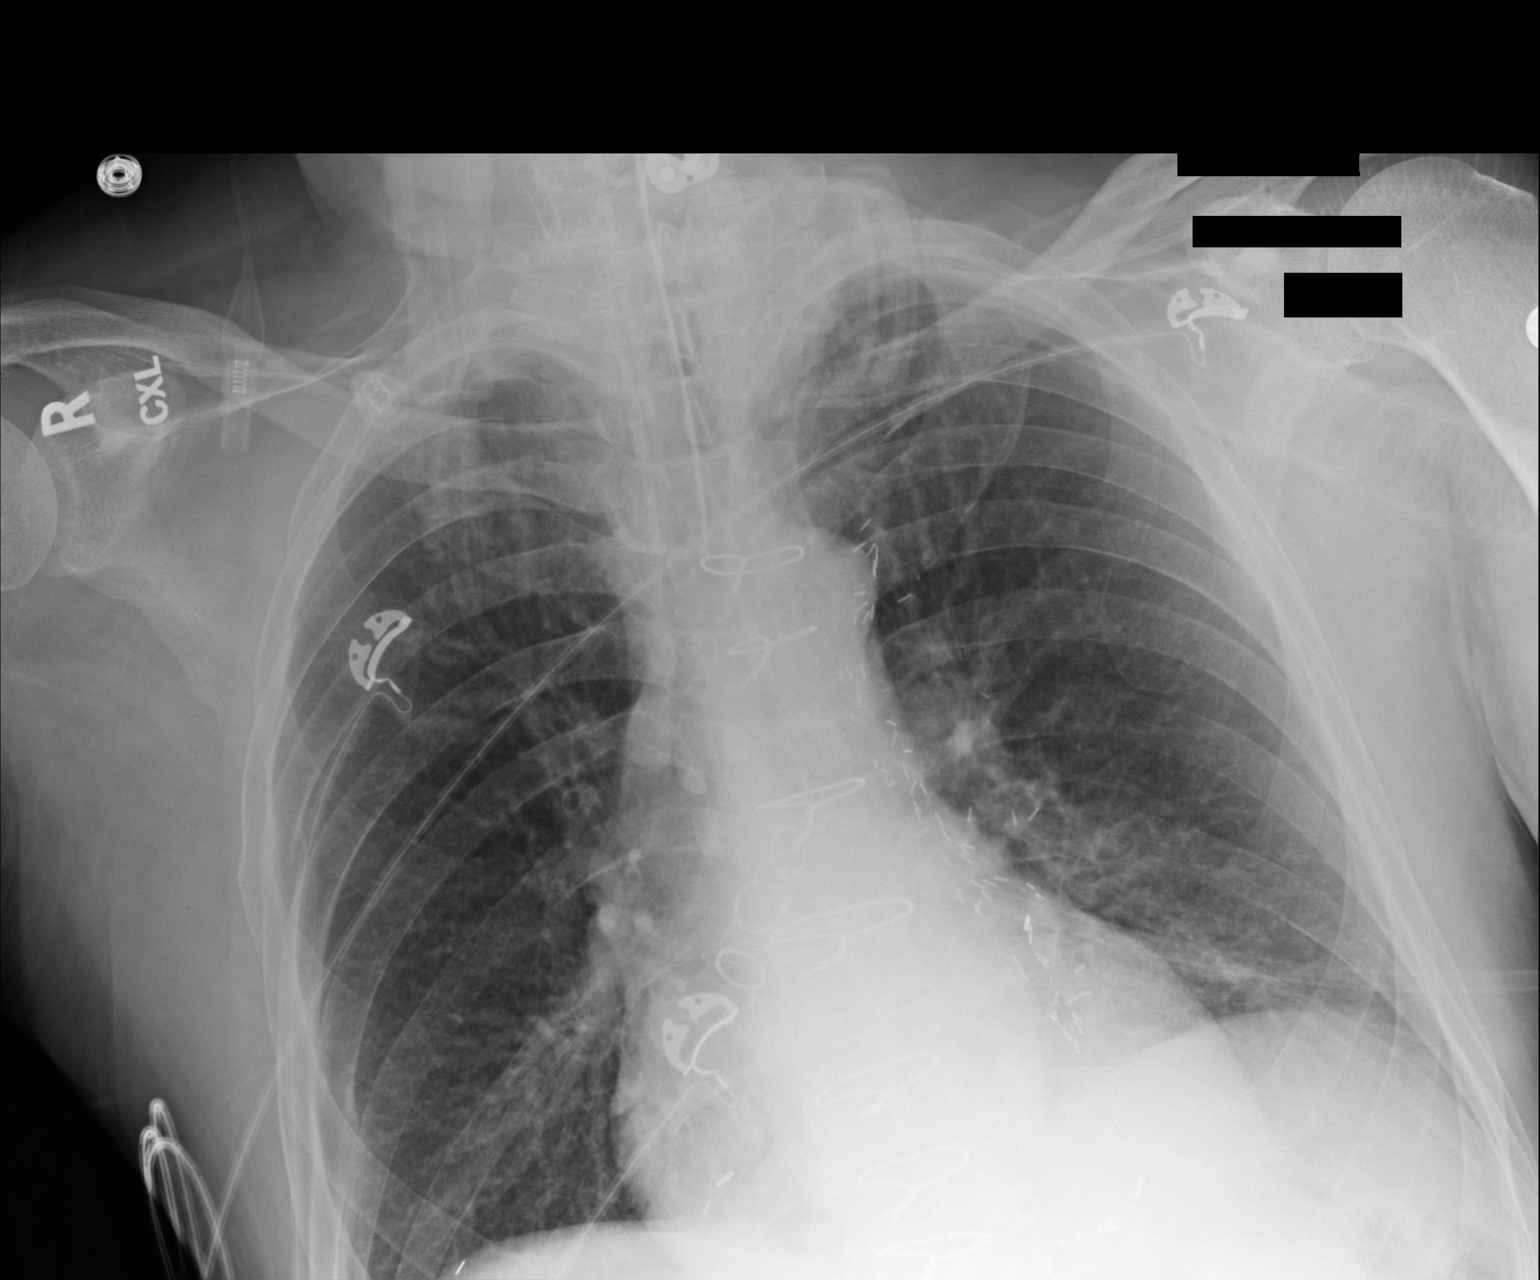

[AP (2 of 2)]
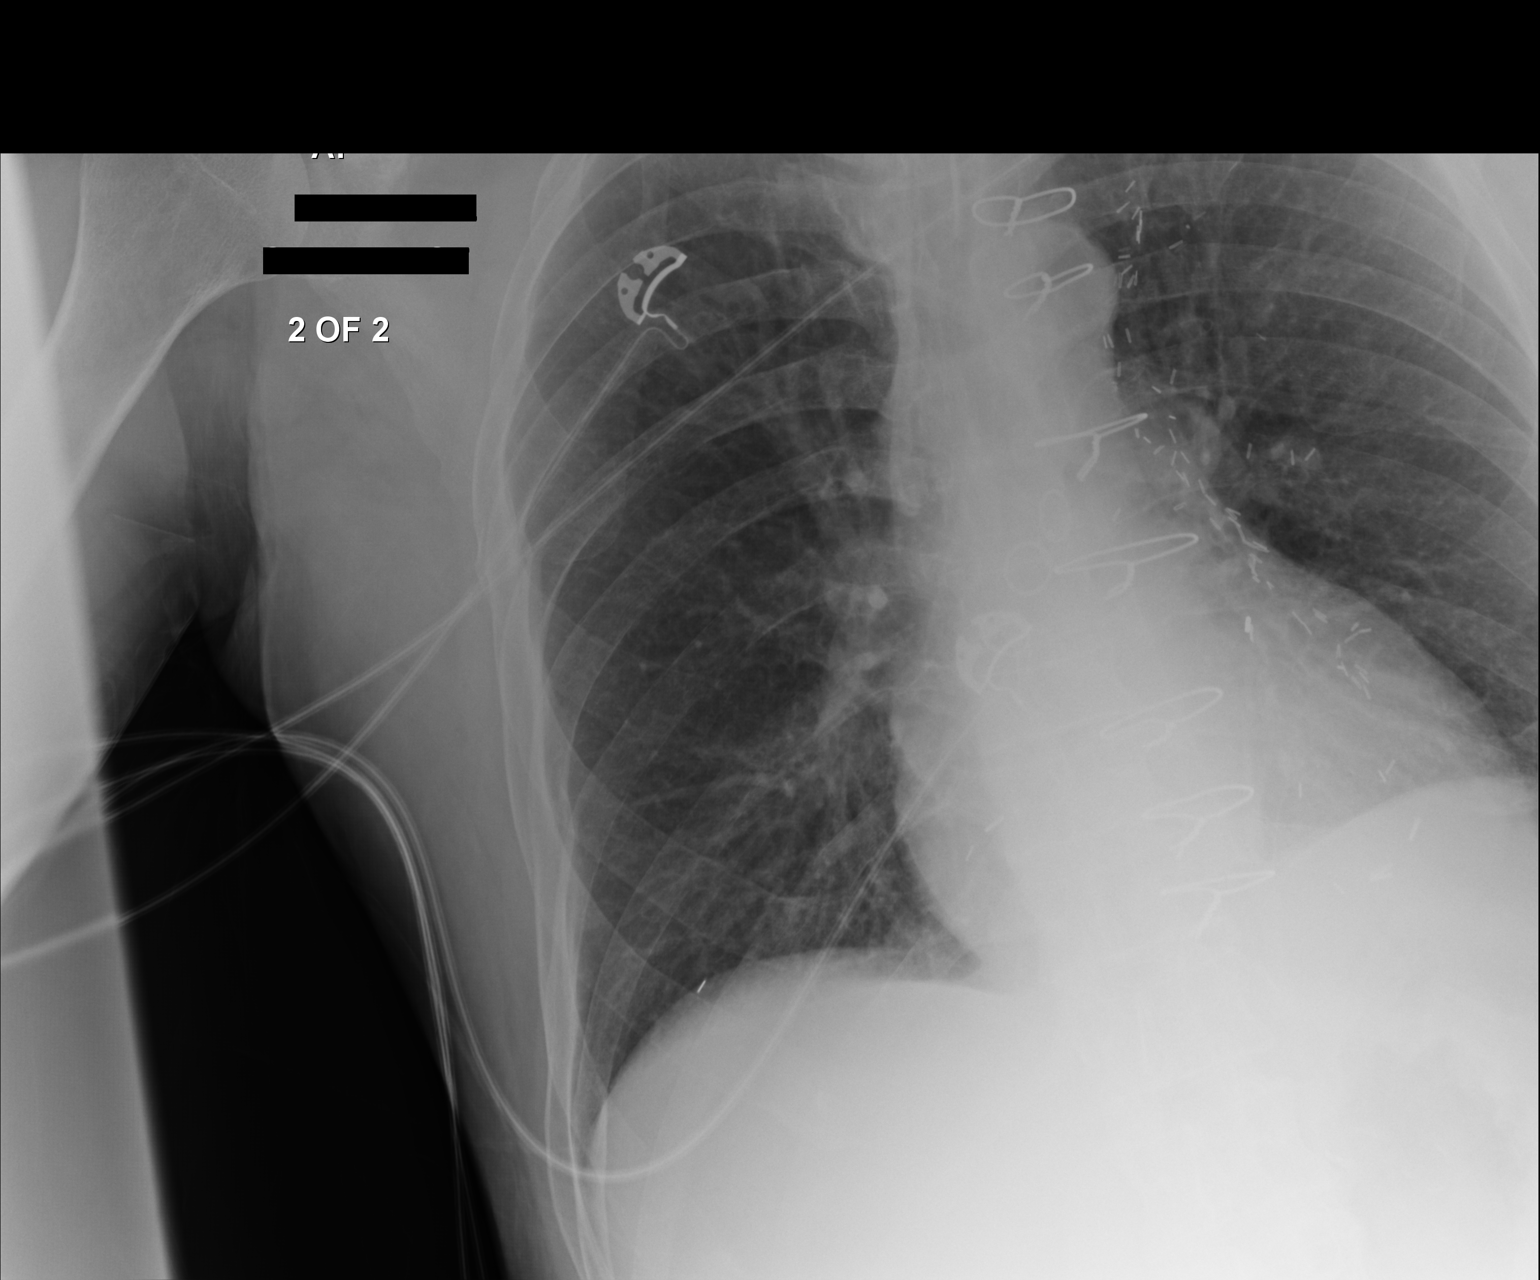

[2 of 2 positions shown; findings below may reference images not displayed]

FINDINGS: Endotracheal tube remains in stable position. Prior CABG. Heart is
upper limits normal in size. Left base atelectasis. No pneumothorax.
No confluent opacity on the right. No effusions.
IMPRESSION: Left base atelectasis.

## 2017-12-24 ENCOUNTER — Ambulatory Visit: Payer: Medicare Other | Admitting: Pediatrics

## 2018-01-07 ENCOUNTER — Ambulatory Visit: Payer: Medicare Other | Admitting: Pediatrics

## 2018-01-08 ENCOUNTER — Ambulatory Visit (INDEPENDENT_AMBULATORY_CARE_PROVIDER_SITE_OTHER): Payer: Medicare Other | Admitting: Pediatrics

## 2018-01-08 ENCOUNTER — Encounter: Payer: Self-pay | Admitting: Pediatrics

## 2018-01-08 VITALS — BP 132/82 | HR 86 | Temp 97.4°F | Resp 20 | Ht 66.0 in | Wt 149.2 lb

## 2018-01-08 DIAGNOSIS — N400 Enlarged prostate without lower urinary tract symptoms: Secondary | ICD-10-CM | POA: Diagnosis not present

## 2018-01-08 DIAGNOSIS — Z8739 Personal history of other diseases of the musculoskeletal system and connective tissue: Secondary | ICD-10-CM

## 2018-01-08 DIAGNOSIS — Z122 Encounter for screening for malignant neoplasm of respiratory organs: Secondary | ICD-10-CM

## 2018-01-08 DIAGNOSIS — J449 Chronic obstructive pulmonary disease, unspecified: Secondary | ICD-10-CM | POA: Diagnosis not present

## 2018-01-08 DIAGNOSIS — G629 Polyneuropathy, unspecified: Secondary | ICD-10-CM

## 2018-01-08 MED ORDER — GABAPENTIN 300 MG PO CAPS: 600.0000 mg | ORAL_CAPSULE | Freq: Three times a day (TID) | ORAL | 5 refills | Status: DC

## 2018-01-08 MED ORDER — UMECLIDINIUM-VILANTEROL 62.5-25 MCG/INH IN AEPB: 1.0000 | INHALATION_SPRAY | Freq: Every day | RESPIRATORY_TRACT | 1 refills | Status: DC

## 2018-01-08 MED ORDER — COLCHICINE 0.6 MG PO TABS
ORAL_TABLET | ORAL | 2 refills | Status: DC
Start: 2018-01-08 — End: 2018-01-08

## 2018-01-08 MED ORDER — TAMSULOSIN HCL 0.4 MG PO CAPS: 0.4000 mg | ORAL_CAPSULE | Freq: Every day | ORAL | 1 refills | Status: DC

## 2018-01-08 MED ORDER — ALLOPURINOL 100 MG PO TABS: 100.0000 mg | ORAL_TABLET | Freq: Every day | ORAL | 1 refills | Status: DC

## 2018-01-08 NOTE — Progress Notes (Signed)
  Subjective:   [name], male    DOB: [birth year], 72 y.o.   MRN: [id number] CC: Medical Management of Chronic Issues  HPI: [name] is a 72 y.o. male   HTN: taking meds regularly, no CP or tightness with exertion.  COPD: breathing he says is the same, wife thinks he is more SOB walking the dog last few weeks since the weather has been hot. Recent COPD exacerbation about three months ago. He thinks breahting back to baseline now. Taking spiriva daily, not needing albuterol, cost $45 when he went to pick it up.  Tobacco use: smoked 1-1.5 ppd for 54 years until recent decrease to 0.5 ppd. Qualifies for low dose CT scan screening.   Neuropathy: taking gabapentin 600mg  BID. Still sometimes with numb feeling. He is not sure if it is helping.  Alcohol use: Stopped drinking all last week.  No withdrawal symptoms.  Started back over the weekend.  Drinking about 6 beers a day when he drinks.  Not every day.  Gout: No recent flares.  Taking allopurinol regularly.  Relevant past medical, surgical, family and social history reviewed. Allergies and medications reviewed and updated. ROS: Per HPI   Objective:    BP 132/82   Pulse 86   Temp (!) 97.4 F (36.3 C) (Oral)   Resp 20   Ht 5\' 6"  (1.676 m)   Wt 149 lb 3.2 oz (67.7 kg)   SpO2 100%   BMI 24.08 kg/m   Wt Readings from Last 3 Encounters:  01/08/18 149 lb 3.2 oz (67.7 kg)  11/21/17 152 lb (68.9 kg)  10/05/17 155 lb (70.3 kg)    Gen: NAD, alert, cooperative with exam, NCAT EYES: EOMI, no conjunctival injection, or no icterus ENT:  OP without erythema LYMPH: no cervical LAD CV: NRRR, normal S1/S2, no murmur, distal pulses 2+ b/l Resp: CTABL, no wheezes, normal WOB Abd: +BS, soft, NTND.  Ext: No edema, warm Neuro: Alert and oriented, strength equal b/l UE and LE, coordination grossly normal MSK: normal muscle bulk  Assessment & Plan:  Brylon was seen today for medical management of chronic issues.  Diagnoses  and all orders for this visit:  Chronic obstructive pulmonary disease, unspecified COPD type ([place]) Stop Spiriva.  Start below.  See if improves breathing symptoms. -     umeclidinium-vilanterol (ANORO ELLIPTA) 62.5-25 MCG/INH AEPB; Inhale 1 puff into the lungs daily.  H/O: gout -     allopurinol (ZYLOPRIM) 100 MG tablet; Take 1 tablet (100 mg total) by mouth daily.  Neuropathy Patient interested in decreasing medicine if not helping.  Will try going down to one tab twice a day, then once a day at night, then stopping -     gabapentin (NEURONTIN) 300 MG capsule; Take 2 capsules (600 mg total) by mouth 3 (three) times daily.  Benign prostatic hyperplasia without lower urinary tract symptoms Stable, taking below -     tamsulosin (FLOMAX) 0.4 MG CAPS capsule; Take 1 capsule (0.4 mg total) by mouth daily.  Encounter for screening for malignant neoplasm of respiratory organs Discussed risk, benefit of screening.  Patient opted to start screening.  He does have 50-pack-year history. -     CT CHEST LUNG CA SCREEN LOW DOSE W/O CM; Future   Follow up plan: Return in about 6 months (around 07/10/2018). Assunta Found, MD [place]

## 2018-01-21 ENCOUNTER — Ambulatory Visit: Payer: Medicare Other | Admitting: *Deleted

## 2018-01-23 ENCOUNTER — Encounter: Payer: Self-pay | Admitting: Pediatrics

## 2018-01-25 ENCOUNTER — Ambulatory Visit (HOSPITAL_COMMUNITY)
Admission: RE | Admit: 2018-01-25 | Discharge: 2018-01-25 | Disposition: A | Discharge status: Home / Self Care | Payer: Medicare Other | Source: Ambulatory Visit | Attending: Pediatrics | Admitting: Pediatrics

## 2018-01-25 DIAGNOSIS — I700 Atherosclerosis of aorta: Secondary | ICD-10-CM | POA: Diagnosis not present

## 2018-01-25 DIAGNOSIS — Z122 Encounter for screening for malignant neoplasm of respiratory organs: Secondary | ICD-10-CM | POA: Insufficient documentation

## 2018-01-25 DIAGNOSIS — Z87891 Personal history of nicotine dependence: Secondary | ICD-10-CM | POA: Diagnosis not present

## 2018-03-11 ENCOUNTER — Ambulatory Visit (INDEPENDENT_AMBULATORY_CARE_PROVIDER_SITE_OTHER): Payer: Medicare Other | Admitting: Family Medicine

## 2018-03-11 ENCOUNTER — Encounter: Payer: Self-pay | Admitting: Family Medicine

## 2018-03-11 VITALS — BP 134/86 | HR 81 | Temp 97.8°F | Ht 66.0 in | Wt 147.0 lb

## 2018-03-11 DIAGNOSIS — B880 Other acariasis: Secondary | ICD-10-CM | POA: Diagnosis not present

## 2018-03-11 MED ORDER — METHYLPREDNISOLONE ACETATE 80 MG/ML IJ SUSP
80.0000 mg | Freq: Once | INTRAMUSCULAR | Status: AC
  Administered 2018-03-11: 80 mg via INTRAMUSCULAR

## 2018-03-11 MED ORDER — PREDNISONE 10 MG (21) PO TBPK: ORAL_TABLET | ORAL | 0 refills | Status: DC

## 2018-03-11 MED ORDER — CETIRIZINE HCL 10 MG PO TABS: 10.0000 mg | ORAL_TABLET | Freq: Every day | ORAL | 0 refills | Status: DC

## 2018-03-11 NOTE — Patient Instructions (Signed)
Insect Bite, Adult An insect bite can make your skin red, itchy, and swollen. An insect bite is different from an insect sting, which happens when an insect injects poison (venom) into the skin. Some insects can spread disease to people through a bite. However, most insect bites do not lead to disease and are not serious. What are the causes? Insects may bite for a variety of reasons, including:  Hunger.  To defend themselves.  Insects that bite include:  Spiders.  Mosquitoes.  Ticks.  Fleas.  Ants.  Flies.  Bedbugs.  What are the signs or symptoms? Symptoms of this condition include:  Itching or pain in the bite area.  Redness and swelling in the bite area.  An open wound (skin ulcer).  In many cases, symptoms last for 2-4 days. How is this diagnosed? This condition is usually diagnosed based on symptoms and a physical exam. How is this treated? Treatment is usually not needed. Symptoms often go away on their own. When treatment is recommended, it may involve:  Applying a cream or lotion to the bitten area. This treatment helps with itching.  Taking an antibiotic medicine. This treatment is needed if the bite area gets infected.  Getting a tetanus shot.  Applying ice to the affected area.  Medicines called antihistamines. This treatment is needed if you develop an allergic reaction to the insect bite.  Follow these instructions at home: Bite area care  Do not scratch the bite area.  Keep the bite area clean and dry. Wash it every day with soap and water as told by your health care provider.  Check the bite area every day for signs of infection. Check for: ? More redness, swelling, or pain. ? Fluid or blood. ? Warmth. ? Pus. Managing pain, itching, and swelling   You may apply a baking soda paste, cortisone cream, or calamine lotion to the bite area as told by your health care provider.  If directed, applyice to the bite area. ? Put ice in a  plastic bag. ? Place a towel between your skin and the bag. ? Leave the ice on for 20 minutes, 2-3 times per day. Medicines  Apply or take over-the-counter and prescription medicines only as told by your health care provider.  If you were prescribed an antibiotic medicine, use it as told by your health care provider. Do not stop using the antibiotic even if your condition improves. General instructions  Keep all follow-up visits as told by your health care provider. This is important. How is this prevented? To help reduce your risk of insect bites:  When you are outdoors, wear clothing that covers your arms and legs.  Use insect repellent. The best insect repellents contain: ? DEET, picaridin, oil of lemon eucalyptus (OLE), or IR3535. ? Higher amounts of an active ingredient.  If your home windows do not have screens, consider installing them.  Contact a health care provider if:  You have more redness, swelling, or pain in the bite area.  You have fluid, blood, or pus coming from the bite area.  The bite area feels warm to the touch.  You have a fever. Get help right away if:  You have joint pain.  You have a rash.  You have shortness of breath.  You feel unusually tired or sleepy.  You have neck pain.  You have a headache.  You have unusual weakness.  You have chest pain.  You have nausea, vomiting, or pain in the abdomen. This   information is not intended to replace advice given to you by your health care provider. Make sure you discuss any questions you have with your health care provider. Document Released: 08/31/2004 Document Revised: 03/22/2016 Document Reviewed: 01/31/2016 [employer]vier Interactive Patient Education  2018 [employer]  

## 2018-03-11 NOTE — Progress Notes (Signed)
Subjective: CC: chigger bite PCP: Eustaquio Maize, MD BZJ:IRCVE L [name] is a 72 y.o. male presenting to clinic today for:  1. Chigger bites Patient reports that he was mowing the yard last Thursday and thinks that he may have encountered chiggers.  He states he became profusely itchy that night.  He is used topical hydrocortisone cream, nail polish and bathing with little improvement in symptoms.  He describes lesions across bilateral lower extremities, buttocks, back and groin.  No other family members with similar.  He has not used any oral antihistamines.   ROS: Per HPI  No Known Allergies Past Medical History:  Diagnosis Date  . Anginal pain ([place])   . CAD (coronary artery disease)    a. s/p INF MI 1997;  b. s/p CABG 2001;  c. [place] 11/2009:  3v CAD, S-PDA ok with 40% mid, S-OM ok, S-Dx ok, L-LAD ok, EF 50%;  d.  [place] 5/14:  Inferolateral scar, EF 46%, no ischemia  . COPD (chronic obstructive pulmonary disease) ([place])   . Dyspnea   . ETOH abuse   . GERD (gastroesophageal reflux disease)   . Hypercholesterolemia   . Hypertension   . Myocardial infarction ([place])   . Prostate cancer ([place])   . Tobacco abuse     Current Outpatient Medications:  .  acetaminophen (TYLENOL) 500 MG tablet, Take 1,000 mg by mouth every 6 (six) hours as needed for moderate pain or headache., Disp: , Rfl:  .  albuterol (VENTOLIN HFA) 108 (90 Base) MCG/ACT inhaler, INHALE TWO PUFFS INTO THE LUNGS EVERY 6 HOURS AS NEEDED FOR  WHEEZING  OR  SHORTNESS  OF  BREATH, Disp: 9 each, Rfl: 2 .  allopurinol (ZYLOPRIM) 100 MG tablet, Take 1 tablet (100 mg total) by mouth daily., Disp: 90 tablet, Rfl: 1 .  amLODipine (NORVASC) 5 MG tablet, Take 1 tablet (5 mg total) daily by mouth., Disp: 90 tablet, Rfl: 3 .  aspirin 81 MG tablet, Take 81 mg by mouth at bedtime. , Disp: , Rfl:  .  atorvastatin (LIPITOR) 80 MG tablet, Take 1 tablet (80 mg total) daily at 6 PM by mouth., Disp: 90 tablet, Rfl: 3 .  fluticasone (FLONASE)  50 MCG/ACT nasal spray, Place 2 sprays into both nostrils daily. (Patient taking differently: Place 2 sprays into both nostrils daily as needed for allergies. ), Disp: 16 g, Rfl: 6 .  gabapentin (NEURONTIN) 300 MG capsule, Take 2 capsules (600 mg total) by mouth 3 (three) times daily., Disp: 180 capsule, Rfl: 5 .  ibuprofen (ADVIL,MOTRIN) 200 MG tablet, Take 800 mg by mouth every 6 (six) hours as needed for headache or moderate pain., Disp: , Rfl:  .  metoprolol tartrate (LOPRESSOR) 25 MG tablet, Take 0.5 tablets (12.5 mg total) 2 (two) times daily by mouth., Disp: 90 tablet, Rfl: 3 .  nitroGLYCERIN (NITROSTAT) 0.4 MG SL tablet, Place 1 tablet (0.4 mg total) under the tongue every 5 (five) minutes as needed for chest pain., Disp: 20 tablet, Rfl: 3 .  omega-3 acid ethyl esters (LOVAZA) 1 g capsule, TAKE 1 CAPSULE BY MOUTH TWICE DAILY. PATIENT NEEDS APPOINTMENT FOR REFILLS. (Patient taking differently: Take 1 g by mouth 2 (two) times daily. ), Disp: 180 capsule, Rfl: 1 .  omeprazole (PRILOSEC) 20 MG capsule, Take 20 mg by mouth daily., Disp: , Rfl:  .  polyethylene glycol-electrolytes (TRILYTE) 420 g solution, Take 4,000 mLs by mouth as directed., Disp: 4000 mL, Rfl: 0 .  tamsulosin (FLOMAX) 0.4  MG CAPS capsule, Take 1 capsule (0.4 mg total) by mouth daily., Disp: 90 capsule, Rfl: 1 .  tiotropium (SPIRIVA) 18 MCG inhalation capsule, Place 1 capsule (18 mcg total) into inhaler and inhale daily., Disp: 90 capsule, Rfl: 3 .  umeclidinium-vilanterol (ANORO ELLIPTA) 62.5-25 MCG/INH AEPB, Inhale 1 puff into the lungs daily., Disp: 3 each, Rfl: 1 Social History   Socioeconomic History  . Marital status: Married    Spouse name: Not on file  . Number of children: Not on file  . Years of education: Not on file  . Highest education level: Not on file  Occupational History  . Not on file  Social Needs  . Financial resource strain: Not on file  . Food insecurity:    Worry: Not on file    Inability: Not on  file  . Transportation needs:    Medical: Not on file    Non-medical: Not on file  Tobacco Use  . Smoking status: Current Every Day Smoker    Packs/day: 0.50    Years: 50.00    Pack years: 25.00    Types: Cigarettes, E-cigarettes    Start date: 03/08/1970  . Smokeless tobacco: Former Systems developer    Types: Snuff    Quit date: 03/08/2012  Substance and Sexual Activity  . Alcohol use: Yes    Alcohol/week: 0.0 oz    Comment: occasionally  . Drug use: No  . Sexual activity: Yes    Partners: Female  Lifestyle  . Physical activity:    Days per week: Not on file    Minutes per session: Not on file  . Stress: Not on file  Relationships  . Social connections:    Talks on phone: Not on file    Gets together: Not on file    Attends religious service: Not on file    Active member of club or organization: Not on file    Attends meetings of clubs or organizations: Not on file    Relationship status: Not on file  . Intimate partner violence:    Fear of current or ex partner: Not on file    Emotionally abused: Not on file    Physically abused: Not on file    Forced sexual activity: Not on file  Other Topics Concern  . Not on file  Social History Narrative  . Not on file   Family History  Problem Relation Age of Onset  . Diabetes Mother   . Hypertension Father   . Heart disease Brother   . Diabetes Brother   . Heart disease Brother   . Diabetes Brother   . Multiple sclerosis Daughter   . Healthy Son   . Colon cancer Neg Hx     Objective: Office vital signs reviewed. BP 134/86   Pulse 81   Temp 97.8 F (36.6 C) (Oral)   Ht 5\' 6"  (1.676 m)   Wt 147 lb (66.7 kg)   BMI 23.73 kg/m   Physical Examination:  General: Awake, alert, well nourished, No acute distress Skin: Multiple insect bites/maculopapular rash along bilateral inner thighs, lower legs, upper extremities, back and abdomen.  No associated purulence or induration.  Multiple excoriations.  No interdigital lesions  noted  Assessment/ Plan: 72 y.o. male   1. Chigger bites No intra-digital burrowing appreciated.  No evidence of secondary infection.  Will treat symptomatically with Depo-Medrol IM here in office.  Start prednisone taper tomorrow.  Start Zyrtec today.  May use calamine lotion and oatmeal baths to  see skin.  We discussed reasons for return.  If symptoms are persistent, could consider empiric treatment for scabies.  Though low suspicion of this given lack of other family members with similar lesions. - methylPREDNISolone acetate (DEPO-MEDROL) injection 80 mg  Meds ordered this encounter  Medications  . predniSONE (STERAPRED UNI-PAK 21 TAB) 10 MG (21) TBPK tablet    Sig: As directed x 6 days    Dispense:  21 tablet    Refill:  0  . cetirizine (ZYRTEC ALLERGY) 10 MG tablet    Sig: Take 1 tablet (10 mg total) by mouth daily.    Dispense:  14 tablet    Refill:  0  . methylPREDNISolone acetate (DEPO-MEDROL) injection 80 mg     Janora Norlander, DO [place] [phone]

## 2018-04-23 ENCOUNTER — Ambulatory Visit (INDEPENDENT_AMBULATORY_CARE_PROVIDER_SITE_OTHER): Payer: Medicare Other | Admitting: *Deleted

## 2018-04-23 ENCOUNTER — Encounter: Payer: Self-pay | Admitting: *Deleted

## 2018-04-23 VITALS — BP 121/69 | HR 65 | Ht 66.0 in | Wt 141.0 lb

## 2018-04-23 DIAGNOSIS — Z0000 Encounter for general adult medical examination without abnormal findings: Secondary | ICD-10-CM | POA: Diagnosis not present

## 2018-04-23 NOTE — Patient Instructions (Addendum)
Please work on your goal of increasing your water intake to 48 oz per day.  Please review the information given on Advance Directives, and if you complete these please bring our office a copy to file in your chart.  Follow up with Dr. Evette Doffing on 05/16/18 at 10:30 am.   Thank you for coming in for your Annual Wellness Visit today!   Preventive Care 70 Years and Older, Male Preventive care refers to lifestyle choices and visits with your health care provider that can promote health and wellness. What does preventive care include?  A yearly physical exam. This is also called an annual well check.  Dental exams once or twice a year.  Routine eye exams. Ask your health care provider how often you should have your eyes checked.  Personal lifestyle choices, including: ? Daily care of your teeth and gums. ? Regular physical activity. ? Eating a healthy diet. ? Avoiding tobacco and drug use. ? Limiting alcohol use. ? Practicing safe sex. ? Taking low doses of aspirin every day. ? Taking vitamin and mineral supplements as recommended by your health care provider. What happens during an annual well check? The services and screenings done by your health care provider during your annual well check will depend on your age, overall health, lifestyle risk factors, and family history of disease. Counseling Your health care provider may ask you questions about your:  Alcohol use.  Tobacco use.  Drug use.  Emotional well-being.  Home and relationship well-being.  Sexual activity.  Eating habits.  History of falls.  Memory and ability to understand (cognition).  Work and work Statistician.  Screening You may have the following tests or measurements:  Height, weight, and BMI.  Blood pressure.  Lipid and cholesterol levels. These may be checked every 5 years, or more frequently if you are over 83 years old.  Skin check.  Lung cancer screening. You may have this screening  every year starting at age 60 if you have a 30-pack-year history of smoking and currently smoke or have quit within the past 15 years.  Fecal occult blood test (FOBT) of the stool. You may have this test every year starting at age 76.  Flexible sigmoidoscopy or colonoscopy. You may have a sigmoidoscopy every 5 years or a colonoscopy every 10 years starting at age 62.  Prostate cancer screening. Recommendations will vary depending on your family history and other risks.  Hepatitis C blood test.  Hepatitis B blood test.  Sexually transmitted disease (STD) testing.  Diabetes screening. This is done by checking your blood sugar (glucose) after you have not eaten for a while (fasting). You may have this done every 1-3 years.  Abdominal aortic aneurysm (AAA) screening. You may need this if you are a current or former smoker.  Osteoporosis. You may be screened starting at age 51 if you are at high risk.  Talk with your health care provider about your test results, treatment options, and if necessary, the need for more tests. Vaccines Your health care provider may recommend certain vaccines, such as:  Influenza vaccine. This is recommended every year.  Tetanus, diphtheria, and acellular pertussis (Tdap, Td) vaccine. You may need a Td booster every 10 years.  Varicella vaccine. You may need this if you have not been vaccinated.  Zoster vaccine. You may need this after age 15.  Measles, mumps, and rubella (MMR) vaccine. You may need at least one dose of MMR if you were born in 1957 or later. You  may also need a second dose.  Pneumococcal 13-valent conjugate (PCV13) vaccine. One dose is recommended after age 63.  Pneumococcal polysaccharide (PPSV23) vaccine. One dose is recommended after age 96.  Meningococcal vaccine. You may need this if you have certain conditions.  Hepatitis A vaccine. You may need this if you have certain conditions or if you travel or work in places where you may  be exposed to hepatitis A.  Hepatitis B vaccine. You may need this if you have certain conditions or if you travel or work in places where you may be exposed to hepatitis B.  Haemophilus influenzae type b (Hib) vaccine. You may need this if you have certain risk factors.  Talk to your health care provider about which screenings and vaccines you need and how often you need them. This information is not intended to replace advice given to you by your health care provider. Make sure you discuss any questions you have with your health care provider. Document Released: 08/20/2015 Document Revised: 04/12/2016 Document Reviewed: 05/25/2015 Elsevier Interactive Patient Education  [employer].

## 2018-04-24 NOTE — Progress Notes (Signed)
Subjective:   [name] is a 72 y.o. male who presents for Medicare Annual/Subsequent preventive examination.  [name] is accompanied today by his wife.  He retired from [employer] in 2011.  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  [name] has 2 children, 2 step children, 10 total grand and step grand children, and 2 great grandchildren.  He lives with his wife and inside dog.  His step son and his 3 children are living with them currently. [name] feels his health is unchanged from last year.  He reports no  ER visits or hospitalizations in the past year.  His only surgery within the past year was a cataract removal from the right eye.  Review of Systems:   All negative today     Objective:    Vitals: BP 121/69   Pulse 65   Ht 5\' 6"  (1.676 m)   Wt 141 lb (64 kg)   BMI 22.76 kg/m   Body mass index is 22.76 kg/m.  Advanced Directives 04/23/2018 11/21/2017 07/21/2016 07/13/2016 11/26/2012 05/27/2012  Does Patient Have a Medical Advance Directive? No No No No Patient does not have advance directive Patient does not have advance directive;Patient would not like information  Would patient like information on creating a medical advance directive? Yes (MAU/Ambulatory/Procedural Areas - Information given) No - Patient declined - No - Patient declined - -  Pre-existing out of facility DNR order (yellow form or pink MOST form) - - - - No -    Tobacco Social History   Tobacco Use  Smoking Status Current Every Day Smoker  . Packs/day: 0.50  . Years: 50.00  . Pack years: 25.00  . Types: Cigarettes, E-cigarettes  . Start date: 03/08/1970  Smokeless Tobacco Former Systems developer  . Types: Snuff  . Quit date: 03/08/2012     Smoking cessation discussed, patient not ready at this time  Clinical Intake:     Pain Score: 0-No pain                 Past Medical History:  Diagnosis Date  . Anginal pain ([place])   . CAD (coronary artery disease)    a. s/p  INF MI 1997;  b. s/p CABG 2001;  c. [place] 11/2009:  3v CAD, S-PDA ok with 40% mid, S-OM ok, S-Dx ok, L-LAD ok, EF 50%;  d.  [place] 5/14:  Inferolateral scar, EF 46%, no ischemia  . COPD (chronic obstructive pulmonary disease) ([place])   . Dyspnea   . ETOH abuse   . GERD (gastroesophageal reflux disease)   . Hypercholesterolemia   . Hypertension   . Myocardial infarction ([place])   . Prostate cancer ([place])   . Tobacco abuse    Past Surgical History:  Procedure Laterality Date  . ANTERIOR CERVICAL DECOMPRESSION/DISCECTOMY FUSION 4 LEVELS N/A 07/21/2016   Procedure: ANTERIOR CERVICAL DECOMPRESSION/DISCECTOMY FUSION CERVICAL TWO-THREE, CERVICAL THREE-FOUR,. CERVICAL FOUR-FIVE, CERVICAL  FIVE-SIX;  Surgeon: Ashok Pall, MD;  Location: [facility];  Service: Neurosurgery;  Laterality: N/A;  . BACK SURGERY    . CARDIAC CATHETERIZATION  2011  . CARDIAC SURGERY     Open Heart  . COLONOSCOPY     in remote past, Dr. Sharlett Iles. Obtaining records.   . COLONOSCOPY N/A 11/21/2017   Procedure: COLONOSCOPY;  Surgeon: Daneil Dolin, MD;  [facility];  Service: Endoscopy;  Laterality: N/A;  1:15  . CORONARY ANGIOPLASTY    . CORONARY ARTERY BYPASS  GRAFT    . ESOPHAGOGASTRODUODENOSCOPY  05/27/2012   UXL:KGMWNU esophagus, stomach and duodenum s/p dilator  . POLYPECTOMY  11/21/2017   Procedure: POLYPECTOMY;  Surgeon: Daneil Dolin, MD;  [facility];  Service: Endoscopy;;  colon  . PROSTATE SURGERY     Family History  Problem Relation Age of Onset  . Diabetes Mother   . Hypertension Father   . Heart disease Brother   . Diabetes Brother   . Heart disease Brother   . Diabetes Brother   . Multiple sclerosis Daughter   . Healthy Son   . Colon cancer Neg Hx    Social History   Socioeconomic History  . Marital status: Married    Spouse name: Not on file  . Number of children: Not on file  . Years of education: Not on file  . Highest education level: Not on file  Occupational History    . Not on file  Social Needs  . Financial resource strain: Not on file  . Food insecurity:    Worry: Not on file    Inability: Not on file  . Transportation needs:    Medical: Not on file    Non-medical: Not on file  Tobacco Use  . Smoking status: Current Every Day Smoker    Packs/day: 0.50    Years: 50.00    Pack years: 25.00    Types: Cigarettes, E-cigarettes    Start date: 03/08/1970  . Smokeless tobacco: Former Systems developer    Types: Snuff    Quit date: 03/08/2012  Substance and Sexual Activity  . Alcohol use: Yes    Alcohol/week: 6.0 standard drinks    Types: 6 Cans of beer per week    Comment: daily   . Drug use: No  . Sexual activity: Yes    Partners: Female  Lifestyle  . Physical activity:    Days per week: Not on file    Minutes per session: Not on file  . Stress: Not on file  Relationships  . Social connections:    Talks on phone: Not on file    Gets together: Not on file    Attends religious service: Not on file    Active member of club or organization: Not on file    Attends meetings of clubs or organizations: Not on file    Relationship status: Not on file  Other Topics Concern  . Not on file  Social History Narrative  . Not on file    Outpatient Encounter Medications as of 04/23/2018  Medication Sig  . acetaminophen (TYLENOL) 500 MG tablet Take 1,000 mg by mouth every 6 (six) hours as needed for moderate pain or headache.  . albuterol (VENTOLIN HFA) 108 (90 Base) MCG/ACT inhaler INHALE TWO PUFFS INTO THE LUNGS EVERY 6 HOURS AS NEEDED FOR  WHEEZING  OR  SHORTNESS  OF  BREATH  . allopurinol (ZYLOPRIM) 100 MG tablet Take 1 tablet (100 mg total) by mouth daily.  Marland Kitchen amLODipine (NORVASC) 5 MG tablet Take 1 tablet (5 mg total) daily by mouth.  Marland Kitchen aspirin 81 MG tablet Take 81 mg by mouth at bedtime.   Marland Kitchen atorvastatin (LIPITOR) 80 MG tablet Take 1 tablet (80 mg total) daily at 6 PM by mouth.  . cetirizine (ZYRTEC ALLERGY) 10 MG tablet Take 1 tablet (10 mg total) by mouth  daily.  . fluticasone (FLONASE) 50 MCG/ACT nasal spray Place 2 sprays into both nostrils daily. (Patient taking differently: Place 2 sprays into both nostrils  daily as needed for allergies. )  . gabapentin (NEURONTIN) 300 MG capsule Take 2 capsules (600 mg total) by mouth 3 (three) times daily.  Marland Kitchen ibuprofen (ADVIL,MOTRIN) 200 MG tablet Take 800 mg by mouth every 6 (six) hours as needed for headache or moderate pain.  . metoprolol tartrate (LOPRESSOR) 25 MG tablet Take 0.5 tablets (12.5 mg total) 2 (two) times daily by mouth.  . nitroGLYCERIN (NITROSTAT) 0.4 MG SL tablet Place 1 tablet (0.4 mg total) under the tongue every 5 (five) minutes as needed for chest pain.  Marland Kitchen omega-3 acid ethyl esters (LOVAZA) 1 g capsule TAKE 1 CAPSULE BY MOUTH TWICE DAILY. PATIENT NEEDS APPOINTMENT FOR REFILLS. (Patient taking differently: Take 1 g by mouth 2 (two) times daily. )  . omeprazole (PRILOSEC) 20 MG capsule Take 20 mg by mouth daily.  . tamsulosin (FLOMAX) 0.4 MG CAPS capsule Take 1 capsule (0.4 mg total) by mouth daily.  Marland Kitchen umeclidinium-vilanterol (ANORO ELLIPTA) 62.5-25 MCG/INH AEPB Inhale 1 puff into the lungs daily.  . [DISCONTINUED] polyethylene glycol-electrolytes (TRILYTE) 420 g solution Take 4,000 mLs by mouth as directed.  . [DISCONTINUED] predniSONE (STERAPRED UNI-PAK 21 TAB) 10 MG (21) TBPK tablet As directed x 6 days   No facility-administered encounter medications on file as of 04/23/2018.     Activities of Daily Living In your present state of health, do you have any difficulty performing the following activities: 04/23/2018  Hearing? Y  Comment bilateral hearing aids  Vision? N  Difficulty concentrating or making decisions? N  Walking or climbing stairs? N  Dressing or bathing? N  Doing errands, shopping? Y  Comment wife drives patient to appointments and grocery store as he does not have a Forensic scientist and eating ? N  Using the Toilet? N  In the past six months, have you  accidently leaked urine? N  Do you have problems with loss of bowel control? N  Managing your Medications? N  Managing your Finances? N  Housekeeping or managing your Housekeeping? N  Some recent data might be hidden    Patient Care Team: Eustaquio Maize, MD as PCP - General (Internal Medicine) Gala Romney, Cristopher Estimable, MD (Gastroenterology) [name] Alphonse Guild, MD as Consulting Physician (Cardiology) Harlen Labs, MD as Referring Physician (Optometry)   Assessment:   This is a routine wellness examination for [name].  Exercise Activities and Dietary recommendations Current Exercise Habits: Home exercise routine, Type of exercise: walking, Time (Minutes): 15, Frequency (Times/Week): 7, Weekly Exercise (Minutes/Week): 105, Intensity: Mild   Patient usually eats 2 meals per day.  Sandwich for lunch, meat and vegetables or salad for supper.  Recommended diet of mostly lean proteins, vegetables, fruits, and whole grains.   Goals    . DIET - INCREASE WATER INTAKE     Increase water intake to 48 oz per day     . Exercise 150 minutes per week (moderate activity)    . Quit smoking / using tobacco      Patient states he drinks 6 beers per day- recommended decreasing beers and drinking more water.   Fall Risk Fall Risk  04/24/2018 03/11/2018 01/08/2018 09/28/2017 07/23/2017  Falls in the past year? No No No No No   Is the patient's home free of loose throw rugs in walkways, pet beds, electrical cords, etc?   yes      Grab bars in the bathroom? no      Handrails on the stairs?   no stairs in home  Adequate lighting?   yes  Depression Screen PHQ 2/9 Scores 04/24/2018 03/11/2018 01/08/2018 09/28/2017  PHQ - 2 Score 0 0 0 0    Cognitive Function MMSE - Mini Mental State Exam 04/24/2018 01/19/2017  Not completed: - Unable to complete  Orientation to time 5 5  Orientation to Place 5 -  Registration 3 -  Attention/ Calculation 0 -  Recall 3 -  Language- name 2 objects 2 -  Language- repeat 1 -    Language- follow 3 step command 3 -  Language- read & follow direction 0 -  Write a sentence 0 -  Copy design 1 -  Total score 23 -    Patient does not read which contributed to lower MMSE score    Immunization History  Administered Date(s) Administered  . Influenza,inj,Quad PF,6+ Mos 05/08/2016, 07/23/2017  . Pneumococcal Conjugate-13 01/19/2017  . Pneumococcal Polysaccharide-23 11/27/2012    Qualifies for Shingles Vaccine? Yes, declined   Screening Tests Health Maintenance  Topic Date Due  . INFLUENZA VACCINE  03/07/2018  . TETANUS/TDAP  07/23/2018 (Originally 08/20/1964)  . COLONOSCOPY  11/22/2022  . Hepatitis C Screening  Completed  . PNA vac Low Risk Adult  Completed    tdap declined today  Cancer Screenings: Lung: Low Dose CT Chest recommended if Age 25-80 years, 30 pack-year currently smoking OR have quit w/in 15years. Patient does qualify. Colorectal: up to date  Additional Screenings:  Hepatitis C Screening: completed 10/29/15      Plan:     Work on your goal of increasing your water intake to 48 oz per day. Review the information given on Advance Directives, and if you complete these please bring our office a copy to file in your chart. Follow up with Dr. Evette Doffing on 05/16/18 at 10:30 am.   I have personally reviewed and noted the following in the patient's chart:   . Medical and social history . Use of alcohol, tobacco or illicit drugs  . Current medications and supplements . Functional ability and status . Nutritional status . Physical activity . Advanced directives . List of other physicians . Hospitalizations, surgeries, and ER visits in previous 12 months . Vitals . Screenings to include cognitive, depression, and falls . Referrals and appointments  In addition, I have reviewed and discussed with patient certain preventive protocols, quality metrics, and best practice recommendations. A written personalized care plan for preventive services as  well as general preventive health recommendations were provided to patient.     Denyce Robert, RN  04/24/2018

## 2018-05-02 DIAGNOSIS — S82092A Other fracture of left patella, initial encounter for closed fracture: Secondary | ICD-10-CM | POA: Diagnosis not present

## 2018-05-02 DIAGNOSIS — S82002A Unspecified fracture of left patella, initial encounter for closed fracture: Secondary | ICD-10-CM | POA: Diagnosis not present

## 2018-05-02 DIAGNOSIS — M25562 Pain in left knee: Secondary | ICD-10-CM | POA: Diagnosis not present

## 2018-05-06 DIAGNOSIS — S82025A Nondisplaced longitudinal fracture of left patella, initial encounter for closed fracture: Secondary | ICD-10-CM | POA: Diagnosis not present

## 2018-05-16 ENCOUNTER — Ambulatory Visit (INDEPENDENT_AMBULATORY_CARE_PROVIDER_SITE_OTHER): Payer: Medicare Other | Admitting: Pediatrics

## 2018-05-16 ENCOUNTER — Encounter: Payer: Self-pay | Admitting: Pediatrics

## 2018-05-16 VITALS — BP 121/76 | HR 56 | Temp 97.3°F | Ht 66.0 in | Wt 140.2 lb

## 2018-05-16 DIAGNOSIS — I10 Essential (primary) hypertension: Secondary | ICD-10-CM

## 2018-05-16 DIAGNOSIS — J449 Chronic obstructive pulmonary disease, unspecified: Secondary | ICD-10-CM | POA: Diagnosis not present

## 2018-05-16 DIAGNOSIS — E785 Hyperlipidemia, unspecified: Secondary | ICD-10-CM | POA: Diagnosis not present

## 2018-05-16 DIAGNOSIS — Z8739 Personal history of other diseases of the musculoskeletal system and connective tissue: Secondary | ICD-10-CM

## 2018-05-16 DIAGNOSIS — I2510 Atherosclerotic heart disease of native coronary artery without angina pectoris: Secondary | ICD-10-CM

## 2018-05-16 DIAGNOSIS — N400 Enlarged prostate without lower urinary tract symptoms: Secondary | ICD-10-CM

## 2018-05-16 DIAGNOSIS — G629 Polyneuropathy, unspecified: Secondary | ICD-10-CM

## 2018-05-16 MED ORDER — ALLOPURINOL 100 MG PO TABS: 100.0000 mg | ORAL_TABLET | Freq: Every day | ORAL | 1 refills | Status: DC

## 2018-05-16 MED ORDER — NITROGLYCERIN 0.4 MG SL SUBL
0.4000 mg | SUBLINGUAL_TABLET | SUBLINGUAL | 3 refills | Status: DC | PRN
Start: 2018-05-16 — End: 2018-05-16

## 2018-05-16 MED ORDER — FLUTICASONE PROPIONATE 50 MCG/ACT NA SUSP: 2.0000 | Freq: Every day | NASAL | 1 refills | Status: DC | PRN

## 2018-05-16 MED ORDER — TAMSULOSIN HCL 0.4 MG PO CAPS: 0.4000 mg | ORAL_CAPSULE | Freq: Every day | ORAL | 1 refills | Status: DC

## 2018-05-16 MED ORDER — NITROGLYCERIN 0.4 MG SL SUBL: 0.4000 mg | SUBLINGUAL_TABLET | SUBLINGUAL | 3 refills | Status: DC | PRN

## 2018-05-16 MED ORDER — UMECLIDINIUM-VILANTEROL 62.5-25 MCG/INH IN AEPB: 1.0000 | INHALATION_SPRAY | Freq: Every day | RESPIRATORY_TRACT | 1 refills | Status: DC

## 2018-05-16 NOTE — Progress Notes (Signed)
Subjective:   [name], male    DOB: 1946-02-10, 72 y.o.   MRN: [id number] CC: Medical Management of Chronic Issues  HPI: [name] is a 72 y.o. male   Recently fractured the patella.  Following with orthopedics.  Wearing brace regularly.  Neuropathy: Has tingling in bilateral fingers tips starting since before her neck surgery.  No worsening, stop gabapentin after last visit.  was not helping with symptoms.  Has been doing fine off of the medicine.  BPH and history of prostate cancer: Taking tamsulosin regularly.  No new symptoms.  Follow-up with urology.  Has phone number to call to schedule.  COPD: anoro has improved his breath some.  Rarely needing albuterol.  Expensive now that he is in the donut hole.  Tobacco use: 6-7 cigarettes a day.  Working on decreasing  Hyperlipidemia: Taking atorvastatin regularly. Lovaza is expensive and has been getting him into the donut hole.  Sometimes leg cramps at night, has to bend the leg and wait for pain to go away. Appetite has been fine. Regular stooling. Remains off of EtOH.  Relevant past medical, surgical, family and social history reviewed. Allergies and medications reviewed and updated. Social History   Tobacco Use  Smoking Status Current Every Day Smoker  . Packs/day: 0.50  . Years: 50.00  . Pack years: 25.00  . Types: Cigarettes, E-cigarettes  . Start date: 03/08/1970  Smokeless Tobacco Former Systems developer  . Types: Snuff  . Quit date: 03/08/2012   ROS: Per HPI   Objective:    BP 121/76   Pulse (!) 56   Temp (!) 97.3 F (36.3 C) (Oral)   Ht 5' 6"  (1.676 m)   Wt 140 lb 3.2 oz (63.6 kg)   BMI 22.63 kg/m   Wt Readings from Last 3 Encounters:  05/16/18 140 lb 3.2 oz (63.6 kg)  04/23/18 141 lb (64 kg)  03/11/18 147 lb (66.7 kg)    Gen: NAD, alert, cooperative with exam, NCAT EYES: EOMI, no conjunctival injection, or no icterus ENT:OP without erythema LYMPH: no cervical LAD CV: NRRR, normal S1/S2, no  murmur, distal pulses 2+ b/l Resp: CTABL, no wheezes, normal WOB Abd: +BS, soft, NTND. no guarding or organomegaly Ext: No edema, warm Neuro: Alert and oriented, strength equal b/l UE and LE, coordination grossly normal MSK: Left leg in knee stabilizer.  Assessment & Plan:  [name] was seen today for medical management of chronic issues.  Diagnoses and all orders for this visit:  Essential hypertension Stable, continue current medicines -     BMP8+EGFR  Chronic obstructive pulmonary disease, unspecified COPD type ([place]) Stable, continue below.  Continue to encourage smoking cessation. -     umeclidinium-vilanterol (ANORO ELLIPTA) 62.5-25 MCG/INH AEPB; Inhale 1 puff into the lungs daily. -     fluticasone (FLONASE) 50 MCG/ACT nasal spray; Place 2 sprays into both nostrils daily as needed for allergies.  Benign prostatic hyperplasia without lower urinary tract symptoms Stable, due for follow-up with urology for history of prostate cancer.  Patient has phone number to call. -     tamsulosin (FLOMAX) 0.4 MG CAPS capsule; Take 1 capsule (0.4 mg total) by mouth daily.  Hyperlipidemia, unspecified hyperlipidemia type Lovaza too expensive.  Continues to take atorvastatin regularly. -     Lipid panel  Coronary artery disease involving native heart without angina pectoris, unspecified vessel or lesion type Stable, coming appointment with cardiology -     nitroGLYCERIN (NITROSTAT) 0.4 MG SL tablet; Place  1 tablet (0.4 mg total) under the tongue every 5 (five) minutes as needed for chest pain.  Neuropathy Stable off of gabapentin.  H/O: gout No recent flares.  Continue below. -     allopurinol (ZYLOPRIM) 100 MG tablet; Take 1 tablet (100 mg total) by mouth daily. -     Uric Acid   Follow up plan: Return in about 6 months (around 11/15/2018). Assunta Found, MD [place]

## 2018-05-17 LAB — URIC ACID: Uric Acid: 4.7 mg/dL (ref 3.7–8.6)

## 2018-05-17 LAB — BMP8+EGFR
BUN/Creatinine Ratio: 7 — ABNORMAL LOW (ref 10–24)
BUN: 6 mg/dL — ABNORMAL LOW (ref 8–27)
CO2: 25 mmol/L (ref 20–29)
Calcium: 9.7 mg/dL (ref 8.6–10.2)
Chloride: 99 mmol/L (ref 96–106)
Creatinine, Ser: 0.83 mg/dL (ref 0.76–1.27)
GFR calc Af Amer: 103 mL/min/{1.73_m2} (ref >59)
GFR calc non Af Amer: 89 mL/min/{1.73_m2} (ref >59)
Glucose: 97 mg/dL (ref 65–99)
Potassium: 4.3 mmol/L (ref 3.5–5.2)
Sodium: 138 mmol/L (ref 134–144)

## 2018-05-17 LAB — LIPID PANEL
Chol/HDL Ratio: 2.5 ratio (ref 0.0–5.0)
Cholesterol, Total: 130 mg/dL (ref 100–199)
HDL: 52 mg/dL (ref >39)
LDL Calculated: 59 mg/dL (ref 0–99)
Triglycerides: 94 mg/dL (ref 0–149)
VLDL Cholesterol Cal: 19 mg/dL (ref 5–40)

## 2018-05-20 DIAGNOSIS — S82025D Nondisplaced longitudinal fracture of left patella, subsequent encounter for closed fracture with routine healing: Secondary | ICD-10-CM | POA: Diagnosis not present

## 2018-05-20 DIAGNOSIS — M25562 Pain in left knee: Secondary | ICD-10-CM | POA: Diagnosis not present

## 2018-06-18 ENCOUNTER — Ambulatory Visit (INDEPENDENT_AMBULATORY_CARE_PROVIDER_SITE_OTHER): Payer: Medicare Other | Admitting: Cardiology

## 2018-06-18 ENCOUNTER — Encounter: Payer: Self-pay | Admitting: Cardiology

## 2018-06-18 VITALS — BP 130/76 | HR 61 | Ht 66.0 in | Wt 141.2 lb

## 2018-06-18 DIAGNOSIS — I10 Essential (primary) hypertension: Secondary | ICD-10-CM | POA: Diagnosis not present

## 2018-06-18 DIAGNOSIS — I2510 Atherosclerotic heart disease of native coronary artery without angina pectoris: Secondary | ICD-10-CM

## 2018-06-18 DIAGNOSIS — E785 Hyperlipidemia, unspecified: Secondary | ICD-10-CM | POA: Diagnosis not present

## 2018-06-18 MED ORDER — ATORVASTATIN CALCIUM 80 MG PO TABS: 80.0000 mg | ORAL_TABLET | Freq: Every day | ORAL | 3 refills | Status: DC

## 2018-06-18 MED ORDER — METOPROLOL TARTRATE 25 MG PO TABS: 12.5000 mg | ORAL_TABLET | Freq: Two times a day (BID) | ORAL | 3 refills | Status: DC

## 2018-06-18 MED ORDER — AMLODIPINE BESYLATE 5 MG PO TABS: 5.0000 mg | ORAL_TABLET | Freq: Every day | ORAL | 3 refills | Status: DC

## 2018-06-18 NOTE — Progress Notes (Signed)
Clinical Summary [name] is a 72 y.o.male  seen today for follow up of the following medical problems.   1. CAD  - prior MI in 1997, prior CABG approx 13 years ago at [facility]  - cath 2011 showed patent grafts. 04/2013 echo shows LVEF 60%  - myoview 12/2012 shows old inferolateral scar, no ischemia    - no recent chest pain. No SOB or DOE - compliant with meds.   2. HTN  - he remains compliant with meds - lisionpril stopped by pcp per patient report. He did have some elevation of K after starting.     3. HL  . 05/2018 TC 130 TG 94 HDL 52 LDL 59 - compliant with statin  4COPD -followed by pcp   5. AAA screen -negative [place] 06/2016    Past Medical History:  Diagnosis Date  . Anginal pain ([place])   . CAD (coronary artery disease)    a. s/p INF MI 1997;  b. s/p CABG 2001;  c. [place] 11/2009:  3v CAD, S-PDA ok with 40% mid, S-OM ok, S-Dx ok, L-LAD ok, EF 50%;  d.  [place] [facility] 5/14:  Inferolateral scar, EF 46%, no ischemia  . COPD (chronic obstructive pulmonary disease) ([place])   . Dyspnea   . ETOH abuse   . GERD (gastroesophageal reflux disease)   . Hypercholesterolemia   . Hypertension   . Myocardial infarction ([place])   . Prostate cancer ([place])   . Tobacco abuse      No Known Allergies   Current Outpatient Medications  Medication Sig Dispense Refill  . acetaminophen (TYLENOL) 500 MG tablet Take 1,000 mg by mouth every 6 (six) hours as needed for moderate pain or headache.    . albuterol (VENTOLIN HFA) 108 (90 Base) MCG/ACT inhaler INHALE TWO PUFFS INTO THE LUNGS EVERY 6 HOURS AS NEEDED FOR  WHEEZING  OR  SHORTNESS  OF  BREATH 9 each 2  . allopurinol (ZYLOPRIM) 100 MG tablet Take 1 tablet (100 mg total) by mouth daily. 90 tablet 1  . amLODipine (NORVASC) 5 MG tablet Take 1 tablet (5 mg total) daily by mouth. 90 tablet 3  . aspirin 81 MG tablet Take 81 mg by mouth at bedtime.     Marland Kitchen atorvastatin (LIPITOR) 80 MG tablet Take 1 tablet (80 mg total) daily at 6  PM by mouth. 90 tablet 3  . cetirizine (ZYRTEC ALLERGY) 10 MG tablet Take 1 tablet (10 mg total) by mouth daily. 14 tablet 0  . fluticasone (FLONASE) 50 MCG/ACT nasal spray Place 2 sprays into both nostrils daily as needed for allergies. 60 g 1  . HYDROcodone-acetaminophen (NORCO/VICODIN) 5-325 MG tablet     . ibuprofen (ADVIL,MOTRIN) 200 MG tablet Take 800 mg by mouth every 6 (six) hours as needed for headache or moderate pain.    . metoprolol tartrate (LOPRESSOR) 25 MG tablet Take 0.5 tablets (12.5 mg total) 2 (two) times daily by mouth. 90 tablet 3  . nitroGLYCERIN (NITROSTAT) 0.4 MG SL tablet Place 1 tablet (0.4 mg total) under the tongue every 5 (five) minutes as needed for chest pain. 20 tablet 3  . omeprazole (PRILOSEC) 20 MG capsule Take 20 mg by mouth daily.    . tamsulosin (FLOMAX) 0.4 MG CAPS capsule Take 1 capsule (0.4 mg total) by mouth daily. 90 capsule 1  . umeclidinium-vilanterol (ANORO ELLIPTA) 62.5-25 MCG/INH AEPB Inhale 1 puff into the lungs daily. 3 each 1   No current facility-administered medications  for this visit.      Past Surgical History:  Procedure Laterality Date  . ANTERIOR CERVICAL DECOMPRESSION/DISCECTOMY FUSION 4 LEVELS N/A 07/21/2016   Procedure: ANTERIOR CERVICAL DECOMPRESSION/DISCECTOMY FUSION CERVICAL TWO-THREE, CERVICAL THREE-FOUR,. CERVICAL FOUR-FIVE, CERVICAL  FIVE-SIX;  Surgeon: Ashok Pall, MD;  Location: [place];  Service: Neurosurgery;  Laterality: N/A;  . BACK SURGERY    . CARDIAC CATHETERIZATION  2011  . CARDIAC SURGERY     Open Heart  . COLONOSCOPY     in remote past, Dr. Sharlett Iles. Obtaining records.   . COLONOSCOPY N/A 11/21/2017   Procedure: COLONOSCOPY;  Surgeon: Daneil Dolin, MD;  [facility];  Service: Endoscopy;  Laterality: N/A;  1:15  . CORONARY ANGIOPLASTY    . CORONARY ARTERY BYPASS GRAFT    . ESOPHAGOGASTRODUODENOSCOPY  05/27/2012   VEH:MCNOBS esophagus, stomach and duodenum s/p dilator  . POLYPECTOMY  11/21/2017    Procedure: POLYPECTOMY;  Surgeon: Daneil Dolin, MD;  [facility];  Service: Endoscopy;;  colon  . PROSTATE SURGERY       No Known Allergies    Family History  Problem Relation Age of Onset  . Diabetes Mother   . Hypertension Father   . Heart disease Brother   . Diabetes Brother   . Heart disease Brother   . Diabetes Brother   . Multiple sclerosis Daughter   . Healthy Son   . Colon cancer Neg Hx      Social History [name] reports that he has been smoking cigarettes and e-cigarettes. He started smoking about 48 years ago. He has a 25.00 pack-year smoking history. He quit smokeless tobacco use about 6 years ago.  His smokeless tobacco use included snuff. [name] reports that he drinks about 6.0 standard drinks of alcohol per week.   Review of Systems CONSTITUTIONAL: No weight loss, fever, chills, weakness or fatigue.  HEENT: Eyes: No visual loss, blurred vision, double vision or yellow sclerae.No hearing loss, sneezing, congestion, runny nose or sore throat.  SKIN: No rash or itching.  CARDIOVASCULAR: per hpi RESPIRATORY: No shortness of breath, cough or sputum.  GASTROINTESTINAL: No anorexia, nausea, vomiting or diarrhea. No abdominal pain or blood.  GENITOURINARY: No burning on urination, no polyuria NEUROLOGICAL: No headache, dizziness, syncope, paralysis, ataxia, numbness or tingling in the extremities. No change in bowel or bladder control.  MUSCULOSKELETAL: No muscle, back pain, joint pain or stiffness.  LYMPHATICS: No enlarged nodes. No history of splenectomy.  PSYCHIATRIC: No history of depression or anxiety.  ENDOCRINOLOGIC: No reports of sweating, cold or heat intolerance. No polyuria or polydipsia.  Marland Kitchen   Physical Examination Vitals:   06/18/18 1329  BP: 130/76  Pulse: 61  SpO2: 98%   Vitals:   06/18/18 1329  Weight: 141 lb 3.2 oz (64 kg)  Height: 5\' 6"  (1.676 m)    Gen: resting comfortably, no acute distress HEENT: no scleral  icterus, pupils equal round and reactive, no palptable cervical adenopathy,  CV: RRR, no m/r/g, no jvd Resp: Clear to auscultation bilaterally GI: abdomen is soft, non-tender, non-distended, normal bowel sounds, no hepatosplenomegaly MSK: extremities are warm, no edema.  Skin: warm, no rash Neuro:  no focal deficits Psych: appropriate affect   Diagnostic Studies 11/25/2009:  Cardiac Cath Findings: Left ventricular angiogram was performed in the RAO projection and  showed normal left ventricular systolic function with ejection  fraction estimated at 50%. Mild mitral regurgitation was noted.  10.Aortic root angiogram was performed and did not show  enlargement of  the aortic root.  IMPRESSION:  1. Severe triple-vessel coronary artery disease.  2. Patent bypass grafts 4/4.  3. Low normal left ventricular systolic function.   12/2012 Myoview  Inferolateral scar, no active ischemia, LVEF 46%.   05/01/13 Echo: LVEF 55-60%, grade II diastolic dysfunction, multiple WMAs, mild MR,   06/2016 AAA [place] No aneurysm     Assessment and Plan  1.CAD  - no recent symptoms, continue current meds - has not been on ACE-I due to prior elevatiion of potassium - EKG shows sinus brady at 50, he is asymptomatic. COntinue beta blocker for now due to cardiac benefits, if worsening bradycardia or symptoms would need to stop  2. HTN  - at goal, continue current meds  3. HL  - at goal, continue statin   F/u 1 year     Arnoldo Lenis, M.D.,

## 2018-06-18 NOTE — Patient Instructions (Signed)

## 2018-06-25 ENCOUNTER — Ambulatory Visit (INDEPENDENT_AMBULATORY_CARE_PROVIDER_SITE_OTHER): Payer: Medicare Other

## 2018-06-25 DIAGNOSIS — Z23 Encounter for immunization: Secondary | ICD-10-CM

## 2018-09-04 ENCOUNTER — Encounter: Payer: Self-pay | Admitting: Family Medicine

## 2018-09-04 ENCOUNTER — Ambulatory Visit (INDEPENDENT_AMBULATORY_CARE_PROVIDER_SITE_OTHER): Payer: Medicare Other | Admitting: Family Medicine

## 2018-09-04 VITALS — BP 102/62 | HR 90 | Temp 97.7°F | Ht 66.0 in | Wt 147.5 lb

## 2018-09-04 DIAGNOSIS — Z8739 Personal history of other diseases of the musculoskeletal system and connective tissue: Secondary | ICD-10-CM | POA: Diagnosis not present

## 2018-09-04 DIAGNOSIS — N400 Enlarged prostate without lower urinary tract symptoms: Secondary | ICD-10-CM

## 2018-09-04 DIAGNOSIS — I10 Essential (primary) hypertension: Secondary | ICD-10-CM

## 2018-09-04 DIAGNOSIS — J449 Chronic obstructive pulmonary disease, unspecified: Secondary | ICD-10-CM

## 2018-09-04 MED ORDER — ALBUTEROL SULFATE HFA 108 (90 BASE) MCG/ACT IN AERS: INHALATION_SPRAY | RESPIRATORY_TRACT | 2 refills | Status: DC

## 2018-09-04 MED ORDER — TAMSULOSIN HCL 0.4 MG PO CAPS: 0.4000 mg | ORAL_CAPSULE | Freq: Every day | ORAL | 1 refills | Status: DC

## 2018-09-04 MED ORDER — FLUTICASONE-UMECLIDIN-VILANT 100-62.5-25 MCG/INH IN AEPB: 1.0000 | INHALATION_SPRAY | Freq: Every day | RESPIRATORY_TRACT | 3 refills | Status: DC

## 2018-09-04 MED ORDER — ALLOPURINOL 100 MG PO TABS: 100.0000 mg | ORAL_TABLET | Freq: Every day | ORAL | 1 refills | Status: DC

## 2018-09-04 MED ORDER — FLUTICASONE PROPIONATE 50 MCG/ACT NA SUSP: 2.0000 | Freq: Every day | NASAL | 5 refills | Status: DC | PRN

## 2018-09-04 NOTE — Progress Notes (Signed)
Subjective:  [name],  male    DOB: 1946-07-10  Age: 73 y.o.    CC: Medical Management of Chronic Issues   HPI TY BUNTROCK presents for  follow-up of hypertension. Patient has no history of headache chest pain or shortness of breath or recent cough. Patient also denies symptoms of TIA such as numbness weakness lateralizing. Patient denies side effects from medication. States taking it regularly.  Patient also  in for follow-up of elevated cholesterol. Doing well without complaints on current medication. Denies side effects  including myalgia and arthralgia and nausea. Also in today for liver function testing. Currently no chest pain, shortness of breath or other cardiovascular related symptoms noted. Patient in for follow-up of GERD. Currently asymptomatic taking  PPI daily. There is no chest pain or heartburn. No hematemesis and no melena. No dysphagia or choking. Onset is remote. Progression is stable. Complicating factors, none. Patient is also under treatment for COPD.  He uses Anoro Ellipta daily.  Also uses Ventolin HFA as a rescue inhaler.  Patient says he feels constantly like he has a sore throat.  He is not been washing his mouth out after using his Anoro.  He also is having trouble with shortness of breath when he tries to do anything as strenuous is just walking up and down his driveway.  History Brennyn has a past medical history of Anginal pain ([place]), CAD (coronary artery disease), COPD (chronic obstructive pulmonary disease) ([place]), Dyspnea, ETOH abuse, GERD (gastroesophageal reflux disease), Hypercholesterolemia, Hypertension, Myocardial infarction [facility]), Prostate cancer ([place]), and Tobacco abuse.   He has a past surgical history that includes Prostate surgery; Cardiac surgery; Colonoscopy; Back surgery; Esophagogastroduodenoscopy (05/27/2012); Cardiac catheterization (2011); Coronary angioplasty; Coronary artery bypass graft; Anterior cervical  decompression/discectomy fusion 4 level (N/A, 07/21/2016); Colonoscopy (N/A, 11/21/2017); and polypectomy (11/21/2017).   His family history includes Diabetes in his brother, brother, and mother; Healthy in his son; Heart disease in his brother and brother; Hypertension in his father; Multiple sclerosis in his daughter.He reports that he has been smoking cigarettes and e-cigarettes. He started smoking about 48 years ago. He has a 25.00 pack-year smoking history. He quit smokeless tobacco use about 6 years ago.  His smokeless tobacco use included snuff. He reports current alcohol use of about 6.0 standard drinks of alcohol per week. He reports that he does not use drugs.  Current Outpatient Medications on File Prior to Visit  Medication Sig Dispense Refill  . acetaminophen (TYLENOL) 500 MG tablet Take 1,000 mg by mouth every 6 (six) hours as needed for moderate pain or headache.    Marland Kitchen amLODipine (NORVASC) 5 MG tablet Take 1 tablet (5 mg total) by mouth daily. 90 tablet 3  . aspirin 81 MG tablet Take 81 mg by mouth at bedtime.     Marland Kitchen atorvastatin (LIPITOR) 80 MG tablet Take 1 tablet (80 mg total) by mouth daily at 6 PM. 90 tablet 3  . ibuprofen (ADVIL,MOTRIN) 200 MG tablet Take 800 mg by mouth every 6 (six) hours as needed for headache or moderate pain.    . metoprolol tartrate (LOPRESSOR) 25 MG tablet Take 0.5 tablets (12.5 mg total) by mouth 2 (two) times daily. 90 tablet 3  . nitroGLYCERIN (NITROSTAT) 0.4 MG SL tablet Place 1 tablet (0.4 mg total) under the tongue every 5 (five) minutes as needed for chest pain. 20 tablet 3  . omeprazole (PRILOSEC) 20 MG capsule Take 20 mg by mouth daily.    Marland Kitchen  umeclidinium-vilanterol (ANORO ELLIPTA) 62.5-25 MCG/INH AEPB Inhale 1 puff into the lungs daily. 3 each 1   No current facility-administered medications on file prior to visit.     ROS Review of Systems  Constitutional: Negative.   HENT: Negative.   Eyes: Negative for visual disturbance.  Respiratory:  Positive for shortness of breath. Negative for cough.   Cardiovascular: Negative for chest pain and leg swelling.  Gastrointestinal: Negative for abdominal pain, diarrhea, nausea and vomiting.  Genitourinary: Negative for difficulty urinating.  Musculoskeletal: Negative for arthralgias and myalgias.  Skin: Negative for rash.  Neurological: Negative for headaches.  Psychiatric/Behavioral: Negative for sleep disturbance.    Objective:  BP 102/62   Pulse 90   Temp 97.7 F (36.5 C) (Oral)   Ht 5\' 6"  (1.676 m)   Wt 147 lb 8 oz (66.9 kg)   BMI 23.81 kg/m   BP Readings from Last 3 Encounters:  09/04/18 102/62  06/18/18 130/76  05/16/18 121/76    Wt Readings from Last 3 Encounters:  09/04/18 147 lb 8 oz (66.9 kg)  06/18/18 141 lb 3.2 oz (64 kg)  05/16/18 140 lb 3.2 oz (63.6 kg)     Physical Exam Constitutional:      General: He is not in acute distress.    Appearance: He is well-developed.  HENT:     Head: Normocephalic and atraumatic.     Right Ear: External ear normal.     Left Ear: External ear normal.     Nose: Nose normal.  Eyes:     Conjunctiva/sclera: Conjunctivae normal.     Pupils: Pupils are equal, round, and reactive to light.  Neck:     Musculoskeletal: Normal range of motion and neck supple.  Cardiovascular:     Rate and Rhythm: Normal rate and regular rhythm.     Heart sounds: Normal heart sounds. No murmur.  Pulmonary:     Effort: Pulmonary effort is normal. No respiratory distress.     Breath sounds: Wheezing and rhonchi present. No rales.  Abdominal:     Palpations: Abdomen is soft.     Tenderness: There is no abdominal tenderness.  Musculoskeletal: Normal range of motion.  Skin:    General: Skin is warm and dry.  Neurological:     Mental Status: He is alert and oriented to person, place, and time.     Deep Tendon Reflexes: Reflexes are normal and symmetric.  Psychiatric:        Behavior: Behavior normal.        Thought Content: Thought content  normal.        Judgment: Judgment normal.         Assessment & Plan:   Jammie was seen today for medical management of chronic issues.  Diagnoses and all orders for this visit:  Chronic obstructive pulmonary disease, unspecified COPD type ([place]) -     albuterol (VENTOLIN HFA) 108 (90 Base) MCG/ACT inhaler; INHALE TWO PUFFS INTO THE LUNGS EVERY 6 HOURS AS NEEDED FOR  WHEEZING  OR  SHORTNESS  OF  BREATH -     fluticasone (FLONASE) 50 MCG/ACT nasal spray; Place 2 sprays into both nostrils daily as needed for allergies.  Essential hypertension  Benign prostatic hyperplasia without lower urinary tract symptoms -     tamsulosin (FLOMAX) 0.4 MG CAPS capsule; Take 1 capsule (0.4 mg total) by mouth daily.  H/O: gout -     allopurinol (ZYLOPRIM) 100 MG tablet; Take 1 tablet (100 mg total) by mouth daily.  Other orders -     Fluticasone-Umeclidin-Vilant (TRELEGY ELLIPTA) 100-62.5-25 MCG/INH AEPB; Inhale 1 puff into the lungs daily at 3 pm.   I am having [name] start on Fluticasone-Umeclidin-Vilant. I am also having him maintain his aspirin, omeprazole, acetaminophen, ibuprofen, umeclidinium-vilanterol, nitroGLYCERIN, atorvastatin, amLODipine, metoprolol tartrate, tamsulosin, albuterol, fluticasone, and allopurinol.  Meds ordered this encounter  Medications  . tamsulosin (FLOMAX) 0.4 MG CAPS capsule    Sig: Take 1 capsule (0.4 mg total) by mouth daily.    Dispense:  90 capsule    Refill:  1    Please consider 90 day supplies to promote better adherence  . albuterol (VENTOLIN HFA) 108 (90 Base) MCG/ACT inhaler    Sig: INHALE TWO PUFFS INTO THE LUNGS EVERY 6 HOURS AS NEEDED FOR  WHEEZING  OR  SHORTNESS  OF  BREATH    Dispense:  9 each    Refill:  2    Please consider 90 day supplies to promote better adherence  . fluticasone (FLONASE) 50 MCG/ACT nasal spray    Sig: Place 2 sprays into both nostrils daily as needed for allergies.    Dispense:  60 g    Refill:  5  . allopurinol  (ZYLOPRIM) 100 MG tablet    Sig: Take 1 tablet (100 mg total) by mouth daily.    Dispense:  90 tablet    Refill:  1  . Fluticasone-Umeclidin-Vilant (TRELEGY ELLIPTA) 100-62.5-25 MCG/INH AEPB    Sig: Inhale 1 puff into the lungs daily at 3 pm.    Dispense:  3 each    Refill:  3     Follow-up: Return in about 3 months (around 12/04/2018).  [name], M.D.

## 2018-12-04 ENCOUNTER — Encounter: Payer: Self-pay | Admitting: Family Medicine

## 2018-12-04 ENCOUNTER — Ambulatory Visit (INDEPENDENT_AMBULATORY_CARE_PROVIDER_SITE_OTHER): Payer: Medicare Other | Admitting: Family Medicine

## 2018-12-04 ENCOUNTER — Other Ambulatory Visit: Payer: Self-pay

## 2018-12-04 DIAGNOSIS — M4712 Other spondylosis with myelopathy, cervical region: Secondary | ICD-10-CM | POA: Diagnosis not present

## 2018-12-04 DIAGNOSIS — J449 Chronic obstructive pulmonary disease, unspecified: Secondary | ICD-10-CM | POA: Diagnosis not present

## 2018-12-04 DIAGNOSIS — K219 Gastro-esophageal reflux disease without esophagitis: Secondary | ICD-10-CM

## 2018-12-04 DIAGNOSIS — I2510 Atherosclerotic heart disease of native coronary artery without angina pectoris: Secondary | ICD-10-CM

## 2018-12-04 DIAGNOSIS — E785 Hyperlipidemia, unspecified: Secondary | ICD-10-CM | POA: Diagnosis not present

## 2018-12-04 DIAGNOSIS — N400 Enlarged prostate without lower urinary tract symptoms: Secondary | ICD-10-CM

## 2018-12-04 DIAGNOSIS — I10 Essential (primary) hypertension: Secondary | ICD-10-CM | POA: Diagnosis not present

## 2018-12-04 DIAGNOSIS — Z8739 Personal history of other diseases of the musculoskeletal system and connective tissue: Secondary | ICD-10-CM

## 2018-12-04 DIAGNOSIS — M4722 Other spondylosis with radiculopathy, cervical region: Secondary | ICD-10-CM

## 2018-12-04 MED ORDER — ALLOPURINOL 100 MG PO TABS: 100.0000 mg | ORAL_TABLET | Freq: Every day | ORAL | 1 refills | Status: DC

## 2018-12-04 MED ORDER — TAMSULOSIN HCL 0.4 MG PO CAPS: 0.4000 mg | ORAL_CAPSULE | Freq: Every day | ORAL | 1 refills | Status: DC

## 2018-12-04 MED ORDER — NITROGLYCERIN 0.4 MG SL SUBL: 0.4000 mg | SUBLINGUAL_TABLET | SUBLINGUAL | 3 refills | Status: DC | PRN

## 2018-12-04 MED ORDER — AMLODIPINE BESYLATE 5 MG PO TABS: 5.0000 mg | ORAL_TABLET | Freq: Every day | ORAL | 3 refills | Status: DC

## 2018-12-04 MED ORDER — OMEPRAZOLE 20 MG PO CPDR: 20.0000 mg | DELAYED_RELEASE_CAPSULE | Freq: Every day | ORAL | 3 refills | Status: DC

## 2018-12-04 MED ORDER — ATORVASTATIN CALCIUM 80 MG PO TABS: 80.0000 mg | ORAL_TABLET | Freq: Every day | ORAL | 3 refills | Status: DC

## 2018-12-04 MED ORDER — FLUTICASONE-UMECLIDIN-VILANT 100-62.5-25 MCG/INH IN AEPB: 1.0000 | INHALATION_SPRAY | Freq: Every day | RESPIRATORY_TRACT | 3 refills | Status: DC

## 2018-12-04 MED ORDER — ALBUTEROL SULFATE HFA 108 (90 BASE) MCG/ACT IN AERS: INHALATION_SPRAY | RESPIRATORY_TRACT | 2 refills | Status: DC

## 2018-12-04 MED ORDER — BUPROPION HCL ER (SR) 150 MG PO TB12: 150.0000 mg | ORAL_TABLET | Freq: Two times a day (BID) | ORAL | 2 refills | Status: DC

## 2018-12-04 MED ORDER — METOPROLOL TARTRATE 25 MG PO TABS: 12.5000 mg | ORAL_TABLET | Freq: Two times a day (BID) | ORAL | 3 refills | Status: DC

## 2018-12-04 NOTE — Progress Notes (Signed)
Subjective:    [name], male    DOB: 1945/11/18, 73 y.o.   MRN: [id number]   HPI: [name] is a 73 y.o. male presenting for follow-up of hypertension. Patient has no history of headache chest pain or recent cough. Patient also denies symptoms of TIA such as numbness weakness lateralizing. Patient checks  blood pressure at home and has not had any elevated readings recently. Patient denies side effects from his medication. States taking it regularly. BP 118/64, pulse 64.  Patient in for follow-up of elevated cholesterol. Doing well without complaints on current medication. Denies side effects of statin including myalgia and arthralgia and nausea. Also in today for liver function testing. Currently no chest pain, shortness of breath or other cardiovascular related symptoms noted. Patient in for follow-up of GERD. Currently asymptomatic taking  PPI daily. There is no chest pain or heartburn. No hematemesis and no melena. No dysphagia or choking. Onset is remote. Progression is stable. Complicating factors, none. Follow-up BPH: Taking tamsulosin as directed.  Up to bathroom once a night.  Follow-up of COPD.  Patient using Trelegy inhaler and rescue with Ventolin.Still smoking. 10-15 a day. AM cough. Relief with trelegy. Using albuterol occasionally. Dyspnea with walking 200 ft or more. Tried chantix. Didn't work for him. Used it for a month.     Depression screen [facility] 2/9 09/04/2018 05/16/2018 04/24/2018 03/11/2018 01/08/2018  Decreased Interest 0 0 0 0 0  Down, Depressed, Hopeless 0 0 0 0 0  PHQ - 2 Score 0 0 0 0 0     Relevant past medical, surgical, family and social history reviewed and updated as indicated.  Interim medical history since our last visit reviewed. Allergies and medications reviewed and updated.  ROS:  Review of Systems  Constitutional: Negative.   HENT: Negative.   Eyes: Negative for visual disturbance.  Respiratory: Negative for cough and shortness  of breath.   Cardiovascular: Negative for chest pain and leg swelling.  Gastrointestinal: Negative for abdominal pain, diarrhea, nausea and vomiting.  Genitourinary: Negative for difficulty urinating.  Musculoskeletal: Negative for arthralgias and myalgias.  Skin: Negative for rash.  Neurological: Negative for headaches.  Psychiatric/Behavioral: Negative for sleep disturbance.     Social History   Tobacco Use  Smoking Status Current Every Day Smoker  . Packs/day: 0.50  . Years: 50.00  . Pack years: 25.00  . Types: Cigarettes, E-cigarettes  . Start date: 03/08/1970  Smokeless Tobacco Former Systems developer  . Types: Snuff  . Quit date: 03/08/2012       Objective:     Wt Readings from Last 3 Encounters:  09/04/18 147 lb 8 oz (66.9 kg)  06/18/18 141 lb 3.2 oz (64 kg)  05/16/18 140 lb 3.2 oz (63.6 kg)     Exam deferred. Pt. Harbor[place] due to COVID 19. Phone visit performed.   Assessment & Plan:   1. Chronic obstructive pulmonary disease, unspecified COPD type ([place])   2. Essential hypertension   3. Osteoarthritis of cervical spine with myelopathy and radiculopathy   4. Gastroesophageal reflux disease without esophagitis   5. Hyperlipidemia, unspecified hyperlipidemia type     No orders of the defined types were placed in this encounter.   No orders of the defined types were placed in this encounter.     Diagnoses and all orders for this visit:  Chronic obstructive pulmonary disease, unspecified COPD type ([place])  Essential hypertension  Osteoarthritis of cervical spine with myelopathy and radiculopathy  Gastroesophageal  reflux disease without esophagitis  Hyperlipidemia, unspecified hyperlipidemia type    Virtual Visit via telephone Note  I discussed the limitations, risks, security and privacy concerns of performing an evaluation and management service by telephone and the availability of in person appointments. The patient was identified with two identifiers.  Pt.expressed understanding and agreed to proceed. Pt. Is at home. Dr. Livia Snellen is in his office.  Follow Up Instructions:   I discussed the assessment and treatment plan with the patient. The patient was provided an opportunity to ask questions and all were answered. The patient agreed with the plan and demonstrated an understanding of the instructions.   The patient was advised to call back or seek an in-person evaluation if the symptoms worsen or if the condition fails to improve as anticipated.  Visit started: 10:24  Call ended:  10:46 Total minutes including chart review and phone contact time: 28   Follow up plan: Return in about 6 months (around 06/05/2019).  [name], MD [facility] this is Dr. Lonna Cobb, a [name] which up to today

## 2019-02-12 DIAGNOSIS — H10013 Acute follicular conjunctivitis, bilateral: Secondary | ICD-10-CM | POA: Diagnosis not present

## 2019-04-22 ENCOUNTER — Other Ambulatory Visit: Payer: Self-pay | Admitting: Family Medicine

## 2019-04-22 DIAGNOSIS — Z8739 Personal history of other diseases of the musculoskeletal system and connective tissue: Secondary | ICD-10-CM

## 2019-04-22 DIAGNOSIS — N400 Enlarged prostate without lower urinary tract symptoms: Secondary | ICD-10-CM

## 2019-05-09 ENCOUNTER — Other Ambulatory Visit: Payer: Self-pay

## 2019-05-12 ENCOUNTER — Encounter: Payer: Self-pay | Admitting: Family Medicine

## 2019-05-12 ENCOUNTER — Ambulatory Visit (INDEPENDENT_AMBULATORY_CARE_PROVIDER_SITE_OTHER): Payer: Medicare Other | Admitting: Family Medicine

## 2019-05-12 ENCOUNTER — Other Ambulatory Visit: Payer: Self-pay

## 2019-05-12 VITALS — BP 135/74 | HR 67 | Temp 99.1°F | Resp 18 | Ht 66.0 in | Wt 148.0 lb

## 2019-05-12 DIAGNOSIS — I2510 Atherosclerotic heart disease of native coronary artery without angina pectoris: Secondary | ICD-10-CM

## 2019-05-12 DIAGNOSIS — Z23 Encounter for immunization: Secondary | ICD-10-CM | POA: Diagnosis not present

## 2019-05-12 DIAGNOSIS — J449 Chronic obstructive pulmonary disease, unspecified: Secondary | ICD-10-CM

## 2019-05-12 DIAGNOSIS — F17200 Nicotine dependence, unspecified, uncomplicated: Secondary | ICD-10-CM

## 2019-05-12 DIAGNOSIS — Z8739 Personal history of other diseases of the musculoskeletal system and connective tissue: Secondary | ICD-10-CM | POA: Diagnosis not present

## 2019-05-12 DIAGNOSIS — I10 Essential (primary) hypertension: Secondary | ICD-10-CM

## 2019-05-12 DIAGNOSIS — N400 Enlarged prostate without lower urinary tract symptoms: Secondary | ICD-10-CM

## 2019-05-12 DIAGNOSIS — Z122 Encounter for screening for malignant neoplasm of respiratory organs: Secondary | ICD-10-CM

## 2019-05-12 DIAGNOSIS — E785 Hyperlipidemia, unspecified: Secondary | ICD-10-CM

## 2019-05-12 DIAGNOSIS — K219 Gastro-esophageal reflux disease without esophagitis: Secondary | ICD-10-CM

## 2019-05-12 MED ORDER — ATORVASTATIN CALCIUM 80 MG PO TABS: 80.0000 mg | ORAL_TABLET | Freq: Every day | ORAL | 3 refills | Status: DC

## 2019-05-12 MED ORDER — BUPROPION HCL ER (SR) 150 MG PO TB12: 150.0000 mg | ORAL_TABLET | Freq: Two times a day (BID) | ORAL | 2 refills | Status: DC

## 2019-05-12 MED ORDER — AMLODIPINE BESYLATE 5 MG PO TABS: 5.0000 mg | ORAL_TABLET | Freq: Every day | ORAL | 3 refills | Status: DC

## 2019-05-12 MED ORDER — ALLOPURINOL 100 MG PO TABS: 100.0000 mg | ORAL_TABLET | Freq: Every day | ORAL | 1 refills | Status: DC

## 2019-05-12 MED ORDER — TRELEGY ELLIPTA 100-62.5-25 MCG/INH IN AEPB: 1.0000 | INHALATION_SPRAY | Freq: Every day | RESPIRATORY_TRACT | 3 refills | Status: DC

## 2019-05-12 MED ORDER — TAMSULOSIN HCL 0.4 MG PO CAPS: 0.4000 mg | ORAL_CAPSULE | Freq: Every day | ORAL | 1 refills | Status: DC

## 2019-05-12 MED ORDER — ALBUTEROL SULFATE HFA 108 (90 BASE) MCG/ACT IN AERS: INHALATION_SPRAY | RESPIRATORY_TRACT | 1 refills | Status: DC

## 2019-05-12 MED ORDER — OMEPRAZOLE 20 MG PO CPDR: 20.0000 mg | DELAYED_RELEASE_CAPSULE | Freq: Every day | ORAL | 3 refills | Status: DC

## 2019-05-12 NOTE — Progress Notes (Addendum)
Subjective:  [name], male    DOB: 1945/09/15  Age: 73 y.o. MRN: [id number]  CC: Medical Management of Chronic Issues   HPI [name] presents for coughing copious sputum q AM. Clear. Dyspnea is mild. At baseline.  Minimal cough and sputum through the day. He still smokes. Vaping plus about 5 cigarettes a day. Has over 72 yr hx of >1ppd. Denies dyspnea.    presents for  follow-up of hypertension. Patient has no history of headache chest pain or shortness of breath or recent cough. Patient also denies symptoms of TIA such as focal numbness or weakness. Patient denies side effects from medication. States taking it regularly.  Patient in for follow-up of GERD. Currently asymptomatic taking  PPI daily. There is no chest pain or heartburn. No hematemesis and no melena. No dysphagia or choking. Onset is remote. Progression is stable. Complicating factors, none.   in for follow-up of elevated cholesterol. Doing well without complaints on current medication. Denies side effects of statin including myalgia and arthralgia and nausea. Currently no chest pain, shortness of breath or other cardiovascular related symptoms noted.    Depression screen [facility] 2/9 05/12/2019 09/04/2018 05/16/2018  Decreased Interest 0 0 0  Down, Depressed, Hopeless 0 0 0  PHQ - 2 Score 0 0 0    History [name] has a past medical history of Anginal pain ([place]), CAD (coronary artery disease), COPD (chronic obstructive pulmonary disease) (HCC), Dyspnea, ETOH abuse, GERD (gastroesophageal reflux disease), Hypercholesterolemia, Hypertension, Myocardial infarction [facility]), Prostate cancer ([place]), and Tobacco abuse.   He has a past surgical history that includes Prostate surgery; Cardiac surgery; Colonoscopy; Back surgery; Esophagogastroduodenoscopy (05/27/2012); Cardiac catheterization (2011); Coronary angioplasty; Coronary artery bypass graft; Anterior cervical decompression/discectomy fusion 4 level (N/A,  07/21/2016); Colonoscopy (N/A, 11/21/2017); and polypectomy (11/21/2017).   His family history includes Diabetes in his brother, brother, and mother; Healthy in his son; Heart disease in his brother and brother; Hypertension in his father; Multiple sclerosis in his daughter.He reports that he has been smoking cigarettes and e-cigarettes. He started smoking about 49 years ago. He has a 25.00 pack-year smoking history. He quit smokeless tobacco use about 7 years ago.  His smokeless tobacco use included snuff. He reports current alcohol use of about 6.0 standard drinks of alcohol per week. He reports that he does not use drugs.    ROS Review of Systems  Constitutional: Negative.   HENT: Negative.   Eyes: Negative for visual disturbance.  Respiratory: Negative for cough and shortness of breath.   Cardiovascular: Negative for chest pain and leg swelling.  Gastrointestinal: Negative for abdominal pain, diarrhea, nausea and vomiting.  Genitourinary: Positive for frequency (improved by medication. Now sleeping well without nocturia, but couldn't without med). Negative for difficulty urinating.  Musculoskeletal: Negative for arthralgias and myalgias.  Skin: Negative for rash.  Neurological: Negative for headaches.  Psychiatric/Behavioral: Negative for sleep disturbance.    Objective:  BP 135/74    Pulse 67    Temp 99.1 F (37.3 C) (Temporal)    Resp 18    Ht 5' 6"  (1.676 m)    Wt 148 lb (67.1 kg)    SpO2 98%    BMI 23.89 kg/m   BP Readings from Last 3 Encounters:  05/12/19 135/74  09/04/18 102/62  06/18/18 130/76    Wt Readings from Last 3 Encounters:  05/12/19 148 lb (67.1 kg)  09/04/18 147 lb 8 oz (66.9 kg)  06/18/18 141 lb 3.2 oz (64  kg)     Physical Exam Constitutional:      General: He is not in acute distress.    Appearance: He is well-developed.  HENT:     Head: Normocephalic and atraumatic.     Right Ear: External ear normal.     Left Ear: External ear normal.     Nose:  Nose normal.  Eyes:     Conjunctiva/sclera: Conjunctivae normal.     Pupils: Pupils are equal, round, and reactive to light.  Neck:     Musculoskeletal: Normal range of motion and neck supple.  Cardiovascular:     Rate and Rhythm: Normal rate and regular rhythm.     Heart sounds: Normal heart sounds. No murmur.  Pulmonary:     Effort: Pulmonary effort is normal. No respiratory distress.     Breath sounds: Normal breath sounds. No wheezing or rales.  Abdominal:     Palpations: Abdomen is soft.     Tenderness: There is no abdominal tenderness.  Musculoskeletal: Normal range of motion.  Skin:    General: Skin is warm and dry.  Neurological:     Mental Status: He is alert and oriented to person, place, and time.     Deep Tendon Reflexes: Reflexes are normal and symmetric.  Psychiatric:        Behavior: Behavior normal.        Thought Content: Thought content normal.        Judgment: Judgment normal.       Assessment & Plan:   [name] was seen today for medical management of chronic issues.  Diagnoses and all orders for this visit:  Chronic obstructive pulmonary disease, unspecified COPD type ([place]) -     albuterol (VENTOLIN HFA) 108 (90 Base) MCG/ACT inhaler; INHALE TWO PUFFS INTO THE LUNGS EVERY 6 HOURS AS NEEDED FOR  WHEEZING  OR  SHORTNESS  OF  BREATH -     CBC with Differential/Platelet -     CMP14+EGFR  Benign prostatic hyperplasia without lower urinary tract symptoms -     tamsulosin (FLOMAX) 0.4 MG CAPS capsule; Take 1 capsule (0.4 mg total) by mouth daily. -     CBC with Differential/Platelet -     CMP14+EGFR -     PSA Total (Reflex To Free)  H/O: gout -     allopurinol (ZYLOPRIM) 100 MG tablet; Take 1 tablet (100 mg total) by mouth daily. -     CBC with Differential/Platelet -     CMP14+EGFR  Coronary artery disease involving native coronary artery of native heart without angina pectoris -     CBC with Differential/Platelet -     CMP14+EGFR  Gastroesophageal  reflux disease without esophagitis -     CBC with Differential/Platelet -     CMP14+EGFR  Hyperlipidemia, unspecified hyperlipidemia type -     CBC with Differential/Platelet -     CMP14+EGFR -     Lipid panel -     atorvastatin (LIPITOR) 80 MG tablet; Take 1 tablet (80 mg total) by mouth daily at 6 PM.  Current every day smoker -     CBC with Differential/Platelet -     CMP14+EGFR  Encounter for screening for malignant neoplasm of respiratory organs -     CBC with Differential/Platelet -     CMP14+EGFR -     CT CHEST LUNG CANCER SCREENING LOW DOSE WO CONTRAST; Future  Need for immunization against influenza -     Flu Vaccine QUAD High Dose(Fluad)  Essential hypertension, benign -     amLODipine (NORVASC) 5 MG tablet; Take 1 tablet (5 mg total) by mouth daily.  Other orders -     Fluticasone-Umeclidin-Vilant (TRELEGY ELLIPTA) 100-62.5-25 MCG/INH AEPB; Inhale 1 puff into the lungs daily in the afternoon. -     Discontinue: buPROPion (WELLBUTRIN SR) 150 MG 12 hr tablet; Take 1 tablet (150 mg total) by mouth 2 (two) times daily. For depression -     omeprazole (PRILOSEC) 20 MG capsule; Take 1 capsule (20 mg total) by mouth daily.       I have discontinued Deidre Ala L. Narvaiz's buPROPion and buPROPion. I have changed his Fluticasone-Umeclidin-Vilant to Trelegy Ellipta. I have also changed his tamsulosin and allopurinol. Additionally, I am having him maintain his aspirin, acetaminophen, ibuprofen, fluticasone, metoprolol tartrate, nitroGLYCERIN, albuterol, omeprazole, amLODipine, and atorvastatin.  Allergies as of 05/12/2019   No Known Allergies     Medication List       Accurate as of May 12, 2019  8:42 PM. If you have any questions, ask your nurse or doctor.        STOP taking these medications   buPROPion 150 MG 12 hr tablet Commonly known as: WELLBUTRIN SR Stopped by: [name], MD     TAKE these medications   acetaminophen 500 MG tablet Commonly known as:  TYLENOL Take 1,000 mg by mouth every 6 (six) hours as needed for moderate pain or headache.   albuterol 108 (90 Base) MCG/ACT inhaler Commonly known as: Ventolin HFA INHALE TWO PUFFS INTO THE LUNGS EVERY 6 HOURS AS NEEDED FOR  WHEEZING  OR  SHORTNESS  OF  BREATH   allopurinol 100 MG tablet Commonly known as: ZYLOPRIM Take 1 tablet (100 mg total) by mouth daily.   amLODipine 5 MG tablet Commonly known as: NORVASC Take 1 tablet (5 mg total) by mouth daily.   aspirin 81 MG tablet Take 81 mg by mouth at bedtime.   atorvastatin 80 MG tablet Commonly known as: LIPITOR Take 1 tablet (80 mg total) by mouth daily at 6 PM.   fluticasone 50 MCG/ACT nasal spray Commonly known as: FLONASE Place 2 sprays into both nostrils daily as needed for allergies.   ibuprofen 200 MG tablet Commonly known as: ADVIL Take 800 mg by mouth every 6 (six) hours as needed for headache or moderate pain.   metoprolol tartrate 25 MG tablet Commonly known as: LOPRESSOR Take 0.5 tablets (12.5 mg total) by mouth 2 (two) times daily.   nitroGLYCERIN 0.4 MG SL tablet Commonly known as: NITROSTAT Place 1 tablet (0.4 mg total) under the tongue every 5 (five) minutes as needed for chest pain.   omeprazole 20 MG capsule Commonly known as: PRILOSEC Take 1 capsule (20 mg total) by mouth daily.   tamsulosin 0.4 MG Caps capsule Commonly known as: FLOMAX Take 1 capsule (0.4 mg total) by mouth daily.   Trelegy Ellipta 100-62.5-25 MCG/INH Aepb Generic drug: Fluticasone-Umeclidin-Vilant Inhale 1 puff into the lungs daily in the afternoon.        Follow-up: Return in about 6 months (around 11/10/2019).  [name], M.D.

## 2019-05-13 LAB — CMP14+EGFR
ALT: 19 IU/L (ref 0–44)
AST: 32 IU/L (ref 0–40)
Albumin/Globulin Ratio: 1.6 (ref 1.2–2.2)
Albumin: 4.1 g/dL (ref 3.7–4.7)
Alkaline Phosphatase: 99 IU/L (ref 39–117)
BUN/Creatinine Ratio: 12 (ref 10–24)
BUN: 10 mg/dL (ref 8–27)
Bilirubin Total: 0.5 mg/dL (ref 0.0–1.2)
CO2: 24 mmol/L (ref 20–29)
Calcium: 9.2 mg/dL (ref 8.6–10.2)
Chloride: 103 mmol/L (ref 96–106)
Creatinine, Ser: 0.86 mg/dL (ref 0.76–1.27)
GFR calc Af Amer: 101 mL/min/{1.73_m2} (ref >59)
GFR calc non Af Amer: 87 mL/min/{1.73_m2} (ref >59)
Globulin, Total: 2.6 g/dL (ref 1.5–4.5)
Glucose: 111 mg/dL — ABNORMAL HIGH (ref 65–99)
Potassium: 4.4 mmol/L (ref 3.5–5.2)
Sodium: 140 mmol/L (ref 134–144)
Total Protein: 6.7 g/dL (ref 6.0–8.5)

## 2019-05-13 LAB — CBC WITH DIFFERENTIAL/PLATELET
Basophils Absolute: 0 10*3/uL (ref 0.0–0.2)
Basos: 1 %
EOS (ABSOLUTE): 0 10*3/uL (ref 0.0–0.4)
Eos: 1 %
Hematocrit: 38.3 % (ref 37.5–51.0)
Hemoglobin: 13.2 g/dL (ref 13.0–17.7)
Immature Grans (Abs): 0 10*3/uL (ref 0.0–0.1)
Immature Granulocytes: 0 %
Lymphocytes Absolute: 2.4 10*3/uL (ref 0.7–3.1)
Lymphs: 40 %
MCH: 34 pg — ABNORMAL HIGH (ref 26.6–33.0)
MCHC: 34.5 g/dL (ref 31.5–35.7)
MCV: 99 fL — ABNORMAL HIGH (ref 79–97)
Monocytes Absolute: 0.4 10*3/uL (ref 0.1–0.9)
Monocytes: 7 %
Neutrophils Absolute: 3.2 10*3/uL (ref 1.4–7.0)
Neutrophils: 51 %
Platelets: 224 10*3/uL (ref 150–450)
RBC: 3.88 x10E6/uL — ABNORMAL LOW (ref 4.14–5.80)
RDW: 13.3 % (ref 11.6–15.4)
WBC: 6.2 10*3/uL (ref 3.4–10.8)

## 2019-05-13 LAB — PSA TOTAL (REFLEX TO FREE): Prostate Specific Ag, Serum: <0.1 ng/mL (ref 0.0–4.0)

## 2019-05-13 LAB — LIPID PANEL
Chol/HDL Ratio: 1.8 ratio (ref 0.0–5.0)
Cholesterol, Total: 123 mg/dL (ref 100–199)
HDL: 69 mg/dL (ref >39)
LDL Chol Calc (NIH): 45 mg/dL (ref 0–99)
Triglycerides: 33 mg/dL (ref 0–149)
VLDL Cholesterol Cal: 9 mg/dL (ref 5–40)

## 2019-05-27 ENCOUNTER — Other Ambulatory Visit: Payer: Self-pay

## 2019-05-27 ENCOUNTER — Ambulatory Visit (HOSPITAL_COMMUNITY)
Admission: RE | Admit: 2019-05-27 | Discharge: 2019-05-27 | Disposition: A | Discharge status: Home / Self Care | Payer: Medicare Other | Source: Ambulatory Visit | Attending: Family Medicine | Admitting: Family Medicine

## 2019-05-27 DIAGNOSIS — J439 Emphysema, unspecified: Secondary | ICD-10-CM | POA: Insufficient documentation

## 2019-05-27 DIAGNOSIS — Z87891 Personal history of nicotine dependence: Secondary | ICD-10-CM | POA: Diagnosis not present

## 2019-05-27 DIAGNOSIS — I700 Atherosclerosis of aorta: Secondary | ICD-10-CM | POA: Diagnosis not present

## 2019-05-27 DIAGNOSIS — Z122 Encounter for screening for malignant neoplasm of respiratory organs: Secondary | ICD-10-CM | POA: Insufficient documentation

## 2019-10-07 ENCOUNTER — Other Ambulatory Visit: Payer: Self-pay

## 2019-10-08 ENCOUNTER — Other Ambulatory Visit: Payer: Self-pay

## 2019-10-08 ENCOUNTER — Ambulatory Visit (INDEPENDENT_AMBULATORY_CARE_PROVIDER_SITE_OTHER): Payer: Medicare Other | Admitting: Family Medicine

## 2019-10-08 ENCOUNTER — Encounter: Payer: Self-pay | Admitting: Family Medicine

## 2019-10-08 VITALS — BP 135/73 | HR 56 | Temp 98.9°F | Ht 66.0 in | Wt 152.2 lb

## 2019-10-08 DIAGNOSIS — R319 Hematuria, unspecified: Secondary | ICD-10-CM

## 2019-10-08 LAB — URINALYSIS, COMPLETE
Bilirubin, UA: NEGATIVE
Glucose, UA: NEGATIVE
Ketones, UA: NEGATIVE
Leukocytes,UA: NEGATIVE
Nitrite, UA: NEGATIVE
Protein,UA: NEGATIVE
Specific Gravity, UA: 1.01 (ref 1.005–1.030)
Urobilinogen, Ur: 0.2 mg/dL (ref 0.2–1.0)
pH, UA: 6.5 (ref 5.0–7.5)

## 2019-10-08 LAB — MICROSCOPIC EXAMINATION
Bacteria, UA: NONE SEEN
Epithelial Cells (non renal): NONE SEEN /hpf (ref 0–10)
RBC, Urine: >30 /hpf — AB (ref 0–2)
Renal Epithel, UA: NONE SEEN /hpf
WBC, UA: NONE SEEN /hpf (ref 0–5)

## 2019-10-08 NOTE — Progress Notes (Signed)
Subjective:  [name], male    DOB: 02-Jul-1946  Age: 74 y.o. MRN: [id number]  CC: Hematuria   HPI [name] presents for painless hematuria onset 2 days ago.  He was passing heavy clots and bright blood until he gave a specimen here at the office today which was clear.  Patient has a history of prostate cancer with seed implants about 10 years ago.  He has not seen his urologist in a couple of years now.  He is concerned about the etiology of the blood.  He has had no pain no frequency and no urgency.  No back pain no nausea no fever chills or sweats.  Depression screen [facility] 2/9 10/08/2019 05/12/2019 09/04/2018  Decreased Interest 0 0 0  Down, Depressed, Hopeless 0 0 0  PHQ - 2 Score 0 0 0    History [name] has a past medical history of Anginal pain ([place]), CAD (coronary artery disease), COPD (chronic obstructive pulmonary disease) (HCC), Dyspnea, ETOH abuse, GERD (gastroesophageal reflux disease), Hypercholesterolemia, Hypertension, Myocardial infarction [facility]), Prostate cancer ([place]), and Tobacco abuse.   He has a past surgical history that includes Prostate surgery; Cardiac surgery; Colonoscopy; Back surgery; Esophagogastroduodenoscopy (05/27/2012); Cardiac catheterization (2011); Coronary angioplasty; Coronary artery bypass graft; Anterior cervical decompression/discectomy fusion 4 level (N/A, 07/21/2016); Colonoscopy (N/A, 11/21/2017); and polypectomy (11/21/2017).   His family history includes Diabetes in his brother, brother, and mother; Healthy in his son; Heart disease in his brother and brother; Hypertension in his father; Multiple sclerosis in his daughter.He reports that he has been smoking cigarettes and e-cigarettes. He started smoking about 49 years ago. He has a 25.00 pack-year smoking history. He quit smokeless tobacco use about 7 years ago.  His smokeless tobacco use included snuff. He reports current alcohol use of about 6.0 standard drinks of alcohol per week. He  reports that he does not use drugs.    ROS Review of Systems  Constitutional: Negative for fever.  Respiratory: Negative for shortness of breath.   Cardiovascular: Negative for chest pain.  Musculoskeletal: Negative for arthralgias.  Skin: Negative for rash.    Objective:  BP 135/73   Pulse (!) 56   Temp 98.9 F (37.2 C) (Temporal)   Ht 5' 6" (1.676 m)   Wt 152 lb 3.2 oz (69 kg)   BMI 24.57 kg/m   BP Readings from Last 3 Encounters:  10/08/19 135/73  05/12/19 135/74  09/04/18 102/62    Wt Readings from Last 3 Encounters:  10/08/19 152 lb 3.2 oz (69 kg)  05/12/19 148 lb (67.1 kg)  09/04/18 147 lb 8 oz (66.9 kg)     Physical Exam Vitals reviewed.  Constitutional:      Appearance: He is well-developed.  HENT:     Head: Normocephalic and atraumatic.     Right Ear: External ear normal.     Left Ear: External ear normal.     Mouth/Throat:     Pharynx: No oropharyngeal exudate or posterior oropharyngeal erythema.  Eyes:     Pupils: Pupils are equal, round, and reactive to light.  Cardiovascular:     Rate and Rhythm: Normal rate and regular rhythm.     Heart sounds: No murmur.  Pulmonary:     Effort: No respiratory distress.     Breath sounds: Normal breath sounds.  Musculoskeletal:     Cervical back: Normal range of motion and neck supple.  Neurological:     Mental Status: He is alert and oriented  to person, place, and time.       Assessment & Plan:   [name] was seen today for hematuria.  Diagnoses and all orders for this visit:  Hematuria, unspecified type -     Urinalysis, Complete -     Urine Culture; Future -     Urine Culture -     CBC with Differential/Platelet -     CMP14+EGFR -     PSA Total (Reflex To Free)  Other orders -     Microscopic Examination   Patient will contact Dr. Jeffie Pollock, his urologist about follow-up.  He will let me know if any new symptoms happen in the interim specifically as discussed above such as fever dysuria back  pain nausea etc.    I am having [name] maintain his aspirin, acetaminophen, ibuprofen, fluticasone, metoprolol tartrate, nitroGLYCERIN, tamsulosin, allopurinol, albuterol, Trelegy Ellipta, omeprazole, amLODipine, and atorvastatin.  Allergies as of 10/08/2019   No Known Allergies     Medication List       Accurate as of October 08, 2019 10:59 PM. If you have any questions, ask your nurse or doctor.        acetaminophen 500 MG tablet Commonly known as: TYLENOL Take 1,000 mg by mouth every 6 (six) hours as needed for moderate pain or headache.   albuterol 108 (90 Base) MCG/ACT inhaler Commonly known as: Ventolin HFA INHALE TWO PUFFS INTO THE LUNGS EVERY 6 HOURS AS NEEDED FOR  WHEEZING  OR  SHORTNESS  OF  BREATH   allopurinol 100 MG tablet Commonly known as: ZYLOPRIM Take 1 tablet (100 mg total) by mouth daily.   amLODipine 5 MG tablet Commonly known as: NORVASC Take 1 tablet (5 mg total) by mouth daily.   aspirin 81 MG tablet Take 81 mg by mouth at bedtime.   atorvastatin 80 MG tablet Commonly known as: LIPITOR Take 1 tablet (80 mg total) by mouth daily at 6 PM.   fluticasone 50 MCG/ACT nasal spray Commonly known as: FLONASE Place 2 sprays into both nostrils daily as needed for allergies.   ibuprofen 200 MG tablet Commonly known as: ADVIL Take 800 mg by mouth every 6 (six) hours as needed for headache or moderate pain.   metoprolol tartrate 25 MG tablet Commonly known as: LOPRESSOR Take 0.5 tablets (12.5 mg total) by mouth 2 (two) times daily.   nitroGLYCERIN 0.4 MG SL tablet Commonly known as: NITROSTAT Place 1 tablet (0.4 mg total) under the tongue every 5 (five) minutes as needed for chest pain.   omeprazole 20 MG capsule Commonly known as: PRILOSEC Take 1 capsule (20 mg total) by mouth daily.   tamsulosin 0.4 MG Caps capsule Commonly known as: FLOMAX Take 1 capsule (0.4 mg total) by mouth daily.   Trelegy Ellipta 100-62.5-25 MCG/INH Aepb Generic  drug: Fluticasone-Umeclidin-Vilant Inhale 1 puff into the lungs daily in the afternoon.        Follow-up: No follow-ups on file.  [name], M.D.

## 2019-10-09 ENCOUNTER — Telehealth: Payer: Self-pay | Admitting: Family Medicine

## 2019-10-09 LAB — CBC WITH DIFFERENTIAL/PLATELET
Basophils Absolute: 0 10*3/uL (ref 0.0–0.2)
Basos: 1 %
EOS (ABSOLUTE): 0.1 10*3/uL (ref 0.0–0.4)
Eos: 2 %
Hematocrit: 37.1 % — ABNORMAL LOW (ref 37.5–51.0)
Hemoglobin: 13.1 g/dL (ref 13.0–17.7)
Immature Grans (Abs): 0 10*3/uL (ref 0.0–0.1)
Immature Granulocytes: 0 %
Lymphocytes Absolute: 3.2 10*3/uL — ABNORMAL HIGH (ref 0.7–3.1)
Lymphs: 46 %
MCH: 34.2 pg — ABNORMAL HIGH (ref 26.6–33.0)
MCHC: 35.3 g/dL (ref 31.5–35.7)
MCV: 97 fL (ref 79–97)
Monocytes Absolute: 0.5 10*3/uL (ref 0.1–0.9)
Monocytes: 7 %
Neutrophils Absolute: 3.1 10*3/uL (ref 1.4–7.0)
Neutrophils: 44 %
Platelets: 196 10*3/uL (ref 150–450)
RBC: 3.83 x10E6/uL — ABNORMAL LOW (ref 4.14–5.80)
RDW: 14 % (ref 11.6–15.4)
WBC: 6.9 10*3/uL (ref 3.4–10.8)

## 2019-10-09 LAB — CMP14+EGFR
ALT: 19 IU/L (ref 0–44)
AST: 24 IU/L (ref 0–40)
Albumin/Globulin Ratio: 1.5 (ref 1.2–2.2)
Albumin: 4.1 g/dL (ref 3.7–4.7)
Alkaline Phosphatase: 100 IU/L (ref 39–117)
BUN/Creatinine Ratio: 9 — ABNORMAL LOW (ref 10–24)
BUN: 9 mg/dL (ref 8–27)
Bilirubin Total: 0.5 mg/dL (ref 0.0–1.2)
CO2: 22 mmol/L (ref 20–29)
Calcium: 9.2 mg/dL (ref 8.6–10.2)
Chloride: 101 mmol/L (ref 96–106)
Creatinine, Ser: 1.03 mg/dL (ref 0.76–1.27)
GFR calc Af Amer: 84 mL/min/{1.73_m2} (ref >59)
GFR calc non Af Amer: 72 mL/min/{1.73_m2} (ref >59)
Globulin, Total: 2.7 g/dL (ref 1.5–4.5)
Glucose: 73 mg/dL (ref 65–99)
Potassium: 4.1 mmol/L (ref 3.5–5.2)
Sodium: 137 mmol/L (ref 134–144)
Total Protein: 6.8 g/dL (ref 6.0–8.5)

## 2019-10-09 LAB — URINE CULTURE: Organism ID, Bacteria: NO GROWTH

## 2019-10-09 LAB — PSA TOTAL (REFLEX TO FREE): Prostate Specific Ag, Serum: <0.1 ng/mL (ref 0.0–4.0)

## 2019-10-09 NOTE — Telephone Encounter (Signed)
Pt's wife called to see if Dr Livia Snellen has sent over pt's lab results and visit notes from yesterday to Dr Ralene Muskrat office. Says she called Dr Denton Lank office yesterday and was told that they need those records before they can schedule pt to come in.

## 2019-10-10 NOTE — Telephone Encounter (Signed)
I forwarded the labs yesterday. Can someone please send a copy of my note from Wednesday?

## 2019-10-10 NOTE — Telephone Encounter (Signed)
OV note and labs routed to Dr. Jeffie Pollock

## 2019-10-16 DIAGNOSIS — R310 Gross hematuria: Secondary | ICD-10-CM | POA: Diagnosis not present

## 2019-10-16 DIAGNOSIS — N281 Cyst of kidney, acquired: Secondary | ICD-10-CM | POA: Diagnosis not present

## 2019-10-28 ENCOUNTER — Other Ambulatory Visit: Payer: Self-pay | Admitting: Urology

## 2019-10-28 DIAGNOSIS — C670 Malignant neoplasm of trigone of bladder: Secondary | ICD-10-CM | POA: Diagnosis not present

## 2019-10-28 DIAGNOSIS — R310 Gross hematuria: Secondary | ICD-10-CM | POA: Diagnosis not present

## 2019-10-29 ENCOUNTER — Telehealth: Payer: Self-pay | Admitting: Family Medicine

## 2019-10-29 NOTE — Chronic Care Management (AMB) (Signed)
  Chronic Care Management   Note  10/29/2019 Name: [name] MRN: [id number] DOB: 13-Aug-1945  [name] is a 74 y.o. year old male who is a primary care patient of Stacks, Cletus Gash, MD. I reached out to [name] by phone today in response to a referral sent by [name]'s health plan.     Mr. Daily wife [name] was given information about Chronic Care Management services today including:  1. CCM service includes personalized support from designated clinical staff supervised by his physician, including individualized plan of care and coordination with other care providers 2. 24/7 contact phone numbers for assistance for urgent and routine care needs. 3. Service will only be billed when office clinical staff spend 20 minutes or more in a month to coordinate care. 4. Only one practitioner may furnish and bill the service in a calendar month. 5. The patient may stop CCM services at any time (effective at the end of the month) by phone call to the office staff. 6. The patient will be responsible for cost sharing (co-pay) of up to 20% of the service fee (after annual deductible is met).  Patient's wife [name] did not agree to enrollment in care management services and does not wish to consider at this time.  Follow up plan: The patient has been provided with contact information for the care management team and has been advised to call with any health related questions or concerns.   Noreene Larsson, [place], [place], [place] [address] Direct Dial: [phone] [email or web address] Website: [email or web address]

## 2019-11-06 ENCOUNTER — Ambulatory Visit: Payer: Medicare Other | Attending: Internal Medicine

## 2019-11-06 DIAGNOSIS — I4891 Unspecified atrial fibrillation: Secondary | ICD-10-CM

## 2019-11-06 DIAGNOSIS — Z23 Encounter for immunization: Secondary | ICD-10-CM

## 2019-11-06 DIAGNOSIS — I4892 Unspecified atrial flutter: Secondary | ICD-10-CM

## 2019-11-06 HISTORY — DX: Unspecified atrial flutter: I48.92

## 2019-11-06 HISTORY — DX: Unspecified atrial fibrillation: I48.91

## 2019-11-06 NOTE — Progress Notes (Signed)
   Covid-19 Vaccination Clinic  Name:  LEBRON HELFERICH    MRN: [id number] DOB: September 30, 1945  11/06/2019  Mr. Mclaney was observed post Covid-19 immunization for 30 minutes based on pre-vaccination screening without incident. He was provided with Vaccine Information Sheet and instruction to access the V-Safe system.   Mr. Dunsworth was instructed to call 911 with any severe reactions post vaccine: Marland Kitchen Difficulty breathing  . Swelling of face and throat  . A fast heartbeat  . A bad rash all over body  . Dizziness and weakness   Immunizations Administered    Name Date Dose VIS Date Route   Moderna COVID-19 Vaccine 11/06/2019 10:42 AM 0.5 mL 07/08/2019 Intramuscular   Manufacturer: Moderna   Lot: [id number]   [place]:[id number]

## 2019-11-11 ENCOUNTER — Ambulatory Visit: Payer: Medicare Other | Admitting: Family Medicine

## 2019-11-12 NOTE — Patient Instructions (Addendum)
DUE TO COVID-19 ONLY ONE VISITOR IS ALLOWED TO COME WITH YOU AND STAY IN THE WAITING ROOM ONLY DURING PRE OP AND PROCEDURE DAY OF SURGERY. THE 1 VISITOR MAY VISIT WITH YOU AFTER SURGERY IN YOUR PRIVATE ROOM DURING VISITING HOURS ONLY!  YOU NEED TO HAVE A COVID 19 TEST ON 11-24-19 @ 11:05 AM, THIS TEST MUST BE DONE BEFORE SURGERY, COME  [address], [place] , [address].  ([place]) ONCE YOUR COVID TEST IS COMPLETED, PLEASE BEGIN THE QUARANTINE INSTRUCTIONS AS OUTLINED IN YOUR HANDOUT.                [name]  11/12/2019   Your procedure is scheduled on: 11-27-19   Report to [facility] Main  Entrance    Report to Admitting at 7:30 AM     Call this number if you have problems the morning of surgery [phone]    Remember: Do not eat food or drink liquids :After Midnight.     Take these medicines the morning of surgery with A SIP OF WATER: Allopurinol (Zyloprim), Metoprolol Tartrate (Lopressor), Omeprazole (Prilosec), and Tamsulosin (Flomax). You may also use your nasal spray and inhaler.  BRUSH YOUR TEETH MORNING OF SURGERY AND RINSE YOUR MOUTH OUT, NO CHEWING GUM CANDY OR MINTS.                               You may not have any metal on your body including hair pins and              piercings     Do not wear jewelry, cologne, lotions, powders or deodorant                        Men may shave face and neck.   Do not bring valuables to the hospital. [place].  Contacts, dentures or bridgework may not be worn into surgery.     Patients discharged the day of surgery will not be allowed to drive home. IF YOU ARE HAVING SURGERY AND GOING HOME THE SAME DAY, YOU MUST HAVE AN ADULT TO DRIVE YOU HOME AND BE WITH YOU FOR 24 HOURS. YOU MAY GO HOME BY TAXI OR UBER OR ORTHERWISE, BUT AN ADULT MUST ACCOMPANY YOU HOME AND STAY WITH YOU FOR 24 HOURS.  Name and phone number of your driver: Aleksandar Heagerty  [phone]  Special Instructions: N/A              Please read over the following fact sheets you were given: _____________________________________________________________________             [facility] - Preparing for Surgery Before surgery, you can play an important role.  Because skin is not sterile, your skin needs to be as free of germs as possible.  You can reduce the number of germs on your skin by washing with CHG (chlorahexidine gluconate) soap before surgery.  CHG is an antiseptic cleaner which kills germs and bonds with the skin to continue killing germs even after washing. Please DO NOT use if you have an allergy to CHG or antibacterial soaps.  If your skin becomes reddened/irritated stop using the CHG and inform your nurse when you arrive at Short Stay. Do not shave (including legs and underarms) for at  least 48 hours prior to the first CHG shower.  You may shave your face/neck. Please follow these instructions carefully:  1.  Shower with CHG Soap the night before surgery and the  morning of Surgery.  2.  If you choose to wash your hair, wash your hair first as usual with your  normal  shampoo.  3.  After you shampoo, rinse your hair and body thoroughly to remove the  shampoo.                           4.  Use CHG as you would any other liquid soap.  You can apply chg directly  to the skin and wash                       Gently with a scrungie or clean washcloth.  5.  Apply the CHG Soap to your body ONLY FROM THE NECK DOWN.   Do not use on face/ open                           Wound or open sores. Avoid contact with eyes, ears mouth and genitals (private parts).                       Wash face,  Genitals (private parts) with your normal soap.             6.  Wash thoroughly, paying special attention to the area where your surgery  will be performed.  7.  Thoroughly rinse your body with warm water from the neck down.  8.  DO NOT shower/wash with your normal soap after using and  rinsing off  the CHG Soap.                9.  Pat yourself dry with a clean towel.            10.  Wear clean pajamas.            11.  Place clean sheets on your bed the night of your first shower and do not  sleep with pets. Day of Surgery : Do not apply any lotions/deodorants the morning of surgery.  Please wear clean clothes to the hospital/surgery center.  FAILURE TO FOLLOW THESE INSTRUCTIONS MAY RESULT IN THE CANCELLATION OF YOUR SURGERY PATIENT SIGNATURE_________________________________  NURSE SIGNATURE__________________________________  ________________________________________________________________________

## 2019-11-12 NOTE — Progress Notes (Signed)
PCP - [name], MD Cardiologist - [name], MD  Chest x-ray -  EKG -  Stress Test -  ECHO -  Cardiac Cath -   Sleep Study -  CPAP -   Fasting Blood Sugar -  Checks Blood Sugar _____ times a day  Blood Thinner Instructions: Aspirin Instructions: Last Dose:  Anesthesia review:   Patient denies shortness of breath, fever, cough and chest pain at PAT appointment   Patient verbalized understanding of instructions that were given to them at the PAT appointment. Patient was also instructed that they will need to review over the PAT instructions again at home before surgery.

## 2019-11-19 ENCOUNTER — Other Ambulatory Visit: Payer: Self-pay

## 2019-11-19 ENCOUNTER — Encounter (HOSPITAL_COMMUNITY): Payer: Self-pay | Admitting: Physician Assistant

## 2019-11-19 ENCOUNTER — Encounter (HOSPITAL_COMMUNITY): Payer: Self-pay

## 2019-11-19 ENCOUNTER — Encounter (HOSPITAL_COMMUNITY)
Admission: RE | Admit: 2019-11-19 | Discharge: 2019-11-19 | Disposition: A | Discharge status: Home / Self Care | Payer: Medicare Other | Source: Ambulatory Visit | Attending: Urology | Admitting: Urology

## 2019-11-24 ENCOUNTER — Encounter (HOSPITAL_COMMUNITY)
Admission: RE | Admit: 2019-11-24 | Discharge: 2019-11-24 | Disposition: A | Discharge status: Home / Self Care | Payer: Medicare Other | Source: Ambulatory Visit | Attending: Urology | Admitting: Urology

## 2019-11-24 ENCOUNTER — Other Ambulatory Visit: Payer: Self-pay

## 2019-11-24 ENCOUNTER — Other Ambulatory Visit (HOSPITAL_COMMUNITY)
Admission: RE | Admit: 2019-11-24 | Discharge: 2019-11-24 | Disposition: A | Discharge status: Home / Self Care | Payer: Medicare Other | Source: Ambulatory Visit | Attending: Urology | Admitting: Urology

## 2019-11-24 DIAGNOSIS — Z20822 Contact with and (suspected) exposure to covid-19: Secondary | ICD-10-CM | POA: Insufficient documentation

## 2019-11-24 DIAGNOSIS — Z01818 Encounter for other preprocedural examination: Secondary | ICD-10-CM | POA: Diagnosis not present

## 2019-11-24 LAB — CBC
HCT: 44.3 % (ref 39.0–52.0)
Hemoglobin: 15 g/dL (ref 13.0–17.0)
MCH: 33.9 pg (ref 26.0–34.0)
MCHC: 33.9 g/dL (ref 30.0–36.0)
MCV: 100.2 fL — ABNORMAL HIGH (ref 80.0–100.0)
Platelets: 207 10*3/uL (ref 150–400)
RBC: 4.42 MIL/uL (ref 4.22–5.81)
RDW: 13.4 % (ref 11.5–15.5)
WBC: 6.6 10*3/uL (ref 4.0–10.5)
nRBC: 0 % (ref 0.0–0.2)

## 2019-11-24 LAB — BASIC METABOLIC PANEL
Anion gap: 10 (ref 5–15)
BUN: 9 mg/dL (ref 8–23)
CO2: 27 mmol/L (ref 22–32)
Calcium: 9.5 mg/dL (ref 8.9–10.3)
Chloride: 100 mmol/L (ref 98–111)
Creatinine, Ser: 0.91 mg/dL (ref 0.61–1.24)
GFR calc Af Amer: >60 mL/min (ref >60)
GFR calc non Af Amer: >60 mL/min (ref >60)
Glucose, Bld: 103 mg/dL — ABNORMAL HIGH (ref 70–99)
Potassium: 5.1 mmol/L (ref 3.5–5.1)
Sodium: 137 mmol/L (ref 135–145)

## 2019-11-24 LAB — SARS CORONAVIRUS 2 (TAT 6-24 HRS): SARS Coronavirus 2: NEGATIVE

## 2019-11-25 ENCOUNTER — Ambulatory Visit (INDEPENDENT_AMBULATORY_CARE_PROVIDER_SITE_OTHER): Payer: Medicare Other | Admitting: Family Medicine

## 2019-11-25 ENCOUNTER — Encounter: Payer: Self-pay | Admitting: Family Medicine

## 2019-11-25 ENCOUNTER — Other Ambulatory Visit: Payer: Self-pay

## 2019-11-25 ENCOUNTER — Telehealth: Payer: Self-pay | Admitting: *Deleted

## 2019-11-25 DIAGNOSIS — E785 Hyperlipidemia, unspecified: Secondary | ICD-10-CM

## 2019-11-25 DIAGNOSIS — I10 Essential (primary) hypertension: Secondary | ICD-10-CM

## 2019-11-25 DIAGNOSIS — Z8739 Personal history of other diseases of the musculoskeletal system and connective tissue: Secondary | ICD-10-CM | POA: Diagnosis not present

## 2019-11-25 DIAGNOSIS — N400 Enlarged prostate without lower urinary tract symptoms: Secondary | ICD-10-CM

## 2019-11-25 DIAGNOSIS — I2510 Atherosclerotic heart disease of native coronary artery without angina pectoris: Secondary | ICD-10-CM

## 2019-11-25 DIAGNOSIS — J449 Chronic obstructive pulmonary disease, unspecified: Secondary | ICD-10-CM | POA: Diagnosis not present

## 2019-11-25 MED ORDER — METOPROLOL TARTRATE 25 MG PO TABS: 12.5000 mg | ORAL_TABLET | Freq: Two times a day (BID) | ORAL | 3 refills | Status: DC

## 2019-11-25 MED ORDER — NITROGLYCERIN 0.4 MG SL SUBL: 0.4000 mg | SUBLINGUAL_TABLET | SUBLINGUAL | 3 refills | Status: DC | PRN

## 2019-11-25 MED ORDER — TRELEGY ELLIPTA 100-62.5-25 MCG/INH IN AEPB: 1.0000 | INHALATION_SPRAY | RESPIRATORY_TRACT | 1 refills | Status: DC

## 2019-11-25 MED ORDER — ALLOPURINOL 100 MG PO TABS: 100.0000 mg | ORAL_TABLET | Freq: Every day | ORAL | 1 refills | Status: DC

## 2019-11-25 MED ORDER — ATORVASTATIN CALCIUM 80 MG PO TABS: 80.0000 mg | ORAL_TABLET | Freq: Every day | ORAL | 3 refills | Status: DC

## 2019-11-25 MED ORDER — TAMSULOSIN HCL 0.4 MG PO CAPS: 0.4000 mg | ORAL_CAPSULE | Freq: Every day | ORAL | 1 refills | Status: DC

## 2019-11-25 MED ORDER — AMLODIPINE BESYLATE 5 MG PO TABS: 5.0000 mg | ORAL_TABLET | Freq: Every day | ORAL | 3 refills | Status: DC

## 2019-11-25 MED ORDER — ALBUTEROL SULFATE HFA 108 (90 BASE) MCG/ACT IN AERS: INHALATION_SPRAY | RESPIRATORY_TRACT | 3 refills | Status: DC

## 2019-11-25 MED ORDER — OMEPRAZOLE 20 MG PO CPDR: 20.0000 mg | DELAYED_RELEASE_CAPSULE | Freq: Every day | ORAL | 3 refills | Status: DC

## 2019-11-25 MED ORDER — FLUTICASONE PROPIONATE 50 MCG/ACT NA SUSP: 2.0000 | Freq: Every day | NASAL | 5 refills | Status: DC | PRN

## 2019-11-25 NOTE — Progress Notes (Signed)
Anesthesia Chart Review   Case: [id number] Date/Time: 11/27/19 0915   Procedure: TRANSURETHRAL RESECTION OF BLADDER TUMOR POSSIBLE STENT POSSIBLE GEMCITABINE (Bilateral )   Anesthesia type: General   Pre-op diagnosis: bladder tumor   Location: [facility] / [facility]   Surgeons: Irine Seal, MD       DISCUSSION:74 y.o. current every day smoker with h/o HTN, GERD, COPD, CAD ([place], CABG), bladder tumor scheduled for above procedure 11/27/2019 with Dr. Irine Seal.   Last seen by cardiology 06/18/2018.  Prior MI 1997, CABG approximately 13 years ago.  Cath in 2011 with patent grafts.  Myoview 12/2012 shows old inferolateral scar, no ischemia.  Echo 04/2013 LVEF 60%.  Stable at this visit.  1 year follow up recommended.    New onset A-fib on EKG at PAT visit.  Pt needs cardiac evaluation prior to procedure. Discussed with Dr. Ralene Muskrat scheduler. VS: BP 124/86   Pulse 68   Temp 37 C (Oral)   Resp 18   Ht 5\' 6"  (1.676 m)   Wt 68.1 kg   SpO2 99%   BMI 24.24 kg/m   PROVIDERS: [name], MD is PCP last seen 10/08/2019  [name], MD is Cardiologist  LABS: Labs reviewed: Acceptable for surgery. (all labs ordered are listed, but only abnormal results are displayed)  Labs Reviewed  BASIC METABOLIC PANEL - Abnormal; Notable for the following components:      Result Value   Glucose, Bld 103 (*)    All other components within normal limits  CBC - Abnormal; Notable for the following components:   MCV 100.2 (*)    All other components within normal limits     IMAGES:   EKG: 11/24/2019 Rate 74 bpm Atrial fibrillation Rightward axis Low voltage QRS Cannot rule out Anterior infarct , age undetermined ST & T wave abnormality, consider lateral ischemia Abnormal ECG  CV: Echo 05/01/2013 Study Conclusions   - Left ventricle: The cavity size was mildly dilated. Wall  thickness was normal. Systolic function was normal. The  estimated ejection fraction was in the range of  55% to  60%. Features are consistent with a pseudonormal left  ventricular filling pattern, with concomitant abnormal  relaxation and increased filling pressure (grade 2  diastolic dysfunction). Doppler parameters are consistent  with high ventricular filling pressure.  - Regional wall motion abnormality: Severe hypokinesis of  the basal inferoseptal, basal-mid inferior, and basal-mid  inferolateral myocardium; mild hypokinesis of the mid  inferoseptal and apical inferior myocardium.  - Mitral valve: Mildly thickened leaflets . Mild anterior  leaflet prolapse. Mild regurgitation.  - Left atrium: The atrium was mildly dilated.  - Pulmonary arteries: Inadequate envelope to accurately  assess pulmonary artery systolic pressures.  - Pericardium, extracardiac: A small pericardial effusion  was identified, with no hemodynamic sequelae.    Nuclear stress test 12/18/12: Abnormal combined stress and pharmacologic nuclear myocardial study indicating inferolateral infarction, mildly impaired overall left ventricular systolic function and neither electrocardiographic nor scintigraphic evidence for myocardial ischemia. EF 46%  Past Medical History:  Diagnosis Date   Anginal pain ([place])    CAD (coronary artery disease)    a. s/p INF MI 1997;  b. s/p CABG 2001;  c. [place] 11/2009:  3v CAD, S-PDA ok with 40% mid, S-OM ok, S-Dx ok, L-LAD ok, EF 50%;  d.  [facility] 5/14:  Inferolateral scar, EF 46%, no ischemia   COPD (chronic obstructive pulmonary disease) (HCC)    Dyspnea    ETOH  abuse    GERD (gastroesophageal reflux disease)    Hypercholesterolemia    Hypertension    Myocardial infarction [facility])    Prostate cancer ([place])    Tobacco abuse     Past Surgical History:  Procedure Laterality Date   ANTERIOR CERVICAL DECOMPRESSION/DISCECTOMY FUSION 4 LEVELS N/A 07/21/2016   Procedure: ANTERIOR CERVICAL DECOMPRESSION/DISCECTOMY FUSION CERVICAL TWO-THREE, CERVICAL THREE-FOUR,. CERVICAL FOUR-FIVE, CERVICAL   FIVE-SIX;  Surgeon: Ashok Pall, MD;  Location: [facility];  Service: Neurosurgery;  Laterality: N/A;   BACK SURGERY     CARDIAC CATHETERIZATION  2011   CARDIAC SURGERY     Open Heart   COLONOSCOPY     in remote past, Dr. Sharlett Iles. Obtaining records.    COLONOSCOPY N/A 11/21/2017   Procedure: COLONOSCOPY;  Surgeon: Daneil Dolin, MD;  [facility];  Service: Endoscopy;  Laterality: N/A;  1:15   CORONARY ANGIOPLASTY     CORONARY ARTERY BYPASS GRAFT     ESOPHAGOGASTRODUODENOSCOPY  05/27/2012   [id number] esophagus, stomach and duodenum s/p dilator   POLYPECTOMY  11/21/2017   Procedure: POLYPECTOMY;  Surgeon: Daneil Dolin, MD;  [facility];  Service: Endoscopy;;  colon   PROSTATE SURGERY      MEDICATIONS:  acetaminophen (TYLENOL) 500 MG tablet   albuterol (VENTOLIN HFA) 108 (90 Base) MCG/ACT inhaler   allopurinol (ZYLOPRIM) 100 MG tablet   amLODipine (NORVASC) 5 MG tablet   aspirin 81 MG tablet   atorvastatin (LIPITOR) 80 MG tablet   fluticasone (FLONASE) 50 MCG/ACT nasal spray   Fluticasone-Umeclidin-Vilant (TRELEGY ELLIPTA) 100-62.5-25 MCG/INH AEPB   ibuprofen (ADVIL,MOTRIN) 200 MG tablet   metoprolol tartrate (LOPRESSOR) 25 MG tablet   nitroGLYCERIN (NITROSTAT) 0.4 MG SL tablet   omeprazole (PRILOSEC) 20 MG capsule   tamsulosin (FLOMAX) 0.4 MG CAPS capsule   No current facility-administered medications for this encounter.

## 2019-11-25 NOTE — Telephone Encounter (Signed)
Scheduled for [facility] Bladder Tumor with Dr. Jeffie Pollock on 11/27/2019, did pre-admission testing today and ekg is abnormal. Anesthesia is requesting EKG be reviewed by provider and advise if its okay to proceed with the procedure.

## 2019-11-25 NOTE — Telephone Encounter (Signed)
This patient has not been seen since 2019. EKG from yesterday appears to be new coarse Afib.  I will defer EKG review and decision to proceed with surgery to [name].

## 2019-11-25 NOTE — Telephone Encounter (Signed)
Looks like new diagnosis of afib, needs to be seen prior to surgery. Can see him Fri Apr 30 at [facility]

## 2019-11-25 NOTE — Progress Notes (Signed)
Subjective:  [name], male    DOB: 1946-01-15  Age: 74 y.o. MRN: [id number]  CC: Follow-up   HPI [name] presents follow-up of hypertension. Patient has no history of headache chest pain or shortness of breath or recent cough. Patient also denies symptoms of TIA such as numbness weakness lateralizing. Patient checks  blood pressure at home and has not had any elevated readings recently. Patient denies side effects from his medication. States taking it regularly.  Patient in for follow-up of elevated cholesterol. Doing well without complaints on current medication. Denies side effects of statin including myalgia and arthralgia and nausea. Also in today for liver function testing. Currently no chest pain, shortness of breath or other cardiovascular related symptoms noted.  Patient also has history of coronary artery disease.  He also has COPD.  He has chronic shortness of breath.  He is not having chest pain recently.  He denies recent flareup of his gout.  He is due for uric acid level.   Depression screen [facility] 2/9 11/26/2019 11/25/2019 10/08/2019  Decreased Interest 0 0 0  Down, Depressed, Hopeless 0 0 0  PHQ - 2 Score 0 0 0    History [name] has a past medical history of Anginal pain ([place]), CAD (coronary artery disease), COPD (chronic obstructive pulmonary disease) (HCC), Dyspnea, ETOH abuse, GERD (gastroesophageal reflux disease), Hypercholesterolemia, Hypertension, Myocardial infarction [facility]), Prostate cancer ([place]), and Tobacco abuse.   He has a past surgical history that includes Prostate surgery; Cardiac surgery; Colonoscopy; Back surgery; Esophagogastroduodenoscopy (05/27/2012); Cardiac catheterization (2011); Coronary angioplasty; Coronary artery bypass graft; Anterior cervical decompression/discectomy fusion 4 level (N/A, 07/21/2016); Colonoscopy (N/A, 11/21/2017); and polypectomy (11/21/2017).   His family history includes Diabetes in his brother, brother, and  mother; Healthy in his son; Heart disease in his brother and brother; Hypertension in his father; Multiple sclerosis in his daughter.He reports that he has been smoking cigarettes and e-cigarettes. He started smoking about 49 years ago. He has a 25.00 pack-year smoking history. He quit smokeless tobacco use about 7 years ago.  His smokeless tobacco use included snuff. He reports current alcohol use of about 6.0 standard drinks of alcohol per week. He reports that he does not use drugs.    ROS Review of Systems  Constitutional: Negative.   HENT: Negative.   Eyes: Negative for visual disturbance.  Respiratory: Negative for cough and shortness of breath.   Cardiovascular: Negative for chest pain and leg swelling.  Gastrointestinal: Negative for abdominal pain, diarrhea, nausea and vomiting.  Genitourinary: Positive for difficulty urinating and frequency.  Musculoskeletal: Negative for arthralgias and myalgias.  Skin: Negative for rash.  Neurological: Negative for headaches.  Psychiatric/Behavioral: Negative for sleep disturbance.    Objective:  BP 123/72   Pulse 71   Temp 97.7 F (36.5 C) (Temporal)   Ht 5\' 6"  (1.676 m)   Wt 150 lb 12.8 oz (68.4 kg)   BMI 24.34 kg/m   BP Readings from Last 3 Encounters:  11/25/19 123/72  11/24/19 124/86  10/08/19 135/73    Wt Readings from Last 3 Encounters:  11/25/19 150 lb 12.8 oz (68.4 kg)  11/24/19 150 lb 3 oz (68.1 kg)  11/19/19 150 lb (68 kg)     Physical Exam Vitals reviewed.  Constitutional:      Appearance: He is well-developed.  HENT:     Head: Normocephalic and atraumatic.     Right Ear: Tympanic membrane and external ear normal. No decreased hearing noted.  Left Ear: Tympanic membrane and external ear normal. No decreased hearing noted.     Mouth/Throat:     Pharynx: No oropharyngeal exudate or posterior oropharyngeal erythema.  Eyes:     Pupils: Pupils are equal, round, and reactive to light.  Cardiovascular:      Rate and Rhythm: Normal rate and regular rhythm.     Heart sounds: No murmur.  Pulmonary:     Effort: No respiratory distress.     Breath sounds: Normal breath sounds.  Abdominal:     General: Bowel sounds are normal.     Palpations: Abdomen is soft. There is no mass.     Tenderness: There is no abdominal tenderness.  Musculoskeletal:     Cervical back: Normal range of motion and neck supple.       Assessment & Plan:   Kyllian was seen today for follow-up.  Diagnoses and all orders for this visit:  Chronic obstructive pulmonary disease, unspecified COPD type ([place]) -     albuterol (VENTOLIN HFA) 108 (90 Base) MCG/ACT inhaler; INHALE TWO PUFFS INTO THE LUNGS EVERY 6 HOURS AS NEEDED FOR  WHEEZING  OR  SHORTNESS  OF  BREATH -     fluticasone (FLONASE) 50 MCG/ACT nasal spray; Place 2 sprays into both nostrils daily as needed for allergies.  H/O: gout -     allopurinol (ZYLOPRIM) 100 MG tablet; Take 1 tablet (100 mg total) by mouth daily.  Essential hypertension, benign -     amLODipine (NORVASC) 5 MG tablet; Take 1 tablet (5 mg total) by mouth daily.  Hyperlipidemia, unspecified hyperlipidemia type -     atorvastatin (LIPITOR) 80 MG tablet; Take 1 tablet (80 mg total) by mouth daily at 6 PM.  Coronary artery disease involving native coronary artery of native heart without angina pectoris -     metoprolol tartrate (LOPRESSOR) 25 MG tablet; Take 0.5 tablets (12.5 mg total) by mouth 2 (two) times daily.  Coronary artery disease involving native heart without angina pectoris, unspecified vessel or lesion type -     nitroGLYCERIN (NITROSTAT) 0.4 MG SL tablet; Place 1 tablet (0.4 mg total) under the tongue every 5 (five) minutes as needed for chest pain.  Benign prostatic hyperplasia without lower urinary tract symptoms -     tamsulosin (FLOMAX) 0.4 MG CAPS capsule; Take 1 capsule (0.4 mg total) by mouth daily.  Other orders -     Fluticasone-Umeclidin-Vilant (TRELEGY ELLIPTA)  100-62.5-25 MCG/INH AEPB; Inhale 1 puff into the lungs every morning. -     omeprazole (PRILOSEC) 20 MG capsule; Take 1 capsule (20 mg total) by mouth daily.       I have changed Calvert Cantor Baines's Trelegy Ellipta. I am also having him maintain his aspirin, acetaminophen, ibuprofen, albuterol, allopurinol, amLODipine, atorvastatin, fluticasone, metoprolol tartrate, nitroGLYCERIN, omeprazole, and tamsulosin.  Allergies as of 11/25/2019   No Known Allergies     Medication List       Accurate as of November 25, 2019 11:59 PM. If you have any questions, ask your nurse or doctor.        acetaminophen 500 MG tablet Commonly known as: TYLENOL Take 1,000 mg by mouth every 6 (six) hours as needed for moderate pain or headache.   albuterol 108 (90 Base) MCG/ACT inhaler Commonly known as: Ventolin HFA INHALE TWO PUFFS INTO THE LUNGS EVERY 6 HOURS AS NEEDED FOR  WHEEZING  OR  SHORTNESS  OF  BREATH   allopurinol 100 MG tablet Commonly known as: ZYLOPRIM  Take 1 tablet (100 mg total) by mouth daily.   amLODipine 5 MG tablet Commonly known as: NORVASC Take 1 tablet (5 mg total) by mouth daily.   aspirin 81 MG tablet Take 81 mg by mouth at bedtime.   atorvastatin 80 MG tablet Commonly known as: LIPITOR Take 1 tablet (80 mg total) by mouth daily at 6 PM.   fluticasone 50 MCG/ACT nasal spray Commonly known as: FLONASE Place 2 sprays into both nostrils daily as needed for allergies.   ibuprofen 200 MG tablet Commonly known as: ADVIL Take 800 mg by mouth every 6 (six) hours as needed for headache or moderate pain.   metoprolol tartrate 25 MG tablet Commonly known as: LOPRESSOR Take 0.5 tablets (12.5 mg total) by mouth 2 (two) times daily.   nitroGLYCERIN 0.4 MG SL tablet Commonly known as: NITROSTAT Place 1 tablet (0.4 mg total) under the tongue every 5 (five) minutes as needed for chest pain.   omeprazole 20 MG capsule Commonly known as: PRILOSEC Take 1 capsule (20 mg total) by  mouth daily.   tamsulosin 0.4 MG Caps capsule Commonly known as: FLOMAX Take 1 capsule (0.4 mg total) by mouth daily.   Trelegy Ellipta 100-62.5-25 MCG/INH Aepb Generic drug: Fluticasone-Umeclidin-Vilant Inhale 1 puff into the lungs every morning.        Follow-up: Return in about 6 months (around 05/26/2020).  [name], M.D.

## 2019-11-26 ENCOUNTER — Ambulatory Visit (INDEPENDENT_AMBULATORY_CARE_PROVIDER_SITE_OTHER): Payer: Medicare Other

## 2019-11-26 ENCOUNTER — Encounter: Payer: Self-pay | Admitting: Family Medicine

## 2019-11-26 DIAGNOSIS — Z0000 Encounter for general adult medical examination without abnormal findings: Secondary | ICD-10-CM

## 2019-11-26 NOTE — Patient Instructions (Addendum)
  [facility] Maintenance Summary and Written Plan of Care  [name] ,  Thank you for allowing me to perform your Medicare Annual Wellness Visit and for your ongoing commitment to your health.   Health Maintenance & Immunization History Health Maintenance  Topic Date Due  . TETANUS/TDAP  05/11/2020 (Originally 08/20/1964)  . COVID-19 Vaccine (2 - Moderna 2-dose series) 12/04/2019  . INFLUENZA VACCINE  03/07/2020  . COLONOSCOPY  11/22/2022  . Hepatitis C Screening  Completed  . PNA vac Low Risk Adult  Completed   Immunization History  Administered Date(s) Administered  . Fluad Quad(high Dose 65+) 05/12/2019  . Influenza, High Dose Seasonal PF 06/25/2018  . Influenza,inj,Quad PF,6+ Mos 05/08/2016, 07/23/2017  . Moderna SARS-COVID-2 Vaccination 11/06/2019  . Pneumococcal Conjugate-13 01/19/2017  . Pneumococcal Polysaccharide-23 11/27/2012    These are the patient goals that we discussed: Goals Addressed            This Visit's Progress   . DIET - EAT MORE FRUITS AND VEGETABLES      . Have 3 meals a day          This is a list of Health Maintenance Items that are overdue or due now: There are no preventive care reminders to display for this patient.   Orders/Referrals Placed Today: No orders of the defined types were placed in this encounter.  (Contact our referral department at [phone] if you have not spoken with someone about your referral appointment within the next 5 days)    Follow-up Plan  Dr. Livia Snellen on 05/26/2020 at 10:15am

## 2019-11-26 NOTE — Telephone Encounter (Signed)
Pt confirmed appt

## 2019-11-26 NOTE — Telephone Encounter (Signed)
Pt has appt 12/05/19 with [name]. I will forward notes to [name] for upcoming appt. I will send a note to surgeon Dr. Jeffie Pollock pt has appt for pre op assessment. I will remove from the pre op call back pool. Fax number [phone]

## 2019-11-26 NOTE — Progress Notes (Signed)
MEDICARE ANNUAL WELLNESS VISIT  11/26/2019  Telephone Visit Disclaimer This Medicare AWV was conducted by telephone due to national recommendations for restrictions regarding the COVID-19 Pandemic (e.g. social distancing).  I verified, using two identifiers, that I am speaking with [name] or their authorized healthcare agent. I discussed the limitations, risks, security, and privacy concerns of performing an evaluation and management service by telephone and the potential availability of an in-person appointment in the future. The patient expressed understanding and agreed to proceed.   Subjective:  [name] is a 74 y.o. male patient of Stacks, Cletus Gash, MD who had a Medicare Annual Wellness Visit today via telephone. [name] is Retired and lives with their spouse. he has 2 biological children and two step children. he reports that he is socially active and does interact with friends/family regularly. he is minimally physically active and enjoys watching television and walking his dog.  Patient Care Team: [name], MD as PCP - General (Family Medicine) [name] Alphonse Guild, MD as PCP - Cardiology (Cardiology) Gala Romney Cristopher Estimable, MD (Gastroenterology) [name] Alphonse Guild, MD as Consulting Physician (Cardiology) Harlen Labs, MD as Referring Physician (Optometry)  Advanced Directives 11/26/2019 11/19/2019 04/23/2018 11/21/2017 07/21/2016 07/13/2016 11/26/2012  Does Patient Have a Medical Advance Directive? No No No No No No Patient does not have advance directive  Would patient like information on creating a medical advance directive? No - Patient declined No - Patient declined Yes (MAU/Ambulatory/Procedural Areas - Information given) No - Patient declined - No - Patient declined -  Pre-existing out of facility DNR order (yellow form or pink MOST form) - - - - - - No    Hospital Utilization Over the Past 12 Months: # of hospitalizations or ER visits: 0 # of surgeries: 0  Review  of Systems    Patient reports that his overall health is unchanged compared to last year.  Negative except Patient states that he is scheduled for a procedure tomorrow at [facility]. The procedure will be performed by Dr. Jeffie Pollock. Patient states that there was a spot seen on his bladder. He is unsure what procedure Dr. Jeffie Pollock will be performing tomorrow.  Patient Reported Readings (BP, Pulse, CBG, Weight, etc) none  Pain Assessment Pain : No/denies pain     Current Medications & Allergies (verified) Allergies as of 11/26/2019   No Known Allergies     Medication List       Accurate as of November 26, 2019 11:34 AM. If you have any questions, ask your nurse or doctor.        acetaminophen 500 MG tablet Commonly known as: TYLENOL Take 1,000 mg by mouth every 6 (six) hours as needed for moderate pain or headache.   albuterol 108 (90 Base) MCG/ACT inhaler Commonly known as: Ventolin HFA INHALE TWO PUFFS INTO THE LUNGS EVERY 6 HOURS AS NEEDED FOR  WHEEZING  OR  SHORTNESS  OF  BREATH   allopurinol 100 MG tablet Commonly known as: ZYLOPRIM Take 1 tablet (100 mg total) by mouth daily.   amLODipine 5 MG tablet Commonly known as: NORVASC Take 1 tablet (5 mg total) by mouth daily.   aspirin 81 MG tablet Take 81 mg by mouth at bedtime.   atorvastatin 80 MG tablet Commonly known as: LIPITOR Take 1 tablet (80 mg total) by mouth daily at 6 PM.   fluticasone 50 MCG/ACT nasal spray Commonly known as: FLONASE Place 2 sprays into both nostrils daily as needed  for allergies.   ibuprofen 200 MG tablet Commonly known as: ADVIL Take 800 mg by mouth every 6 (six) hours as needed for headache or moderate pain.   metoprolol tartrate 25 MG tablet Commonly known as: LOPRESSOR Take 0.5 tablets (12.5 mg total) by mouth 2 (two) times daily.   nitroGLYCERIN 0.4 MG SL tablet Commonly known as: NITROSTAT Place 1 tablet (0.4 mg total) under the tongue every 5 (five) minutes as needed for  chest pain.   omeprazole 20 MG capsule Commonly known as: PRILOSEC Take 1 capsule (20 mg total) by mouth daily.   tamsulosin 0.4 MG Caps capsule Commonly known as: FLOMAX Take 1 capsule (0.4 mg total) by mouth daily.   Trelegy Ellipta 100-62.5-25 MCG/INH Aepb Generic drug: Fluticasone-Umeclidin-Vilant Inhale 1 puff into the lungs every morning.       History (reviewed): Past Medical History:  Diagnosis Date  . Anginal pain ([place])   . CAD (coronary artery disease)    a. s/p INF MI 1997;  b. s/p CABG 2001;  c. [place] 11/2009:  3v CAD, S-PDA ok with 40% mid, S-OM ok, S-Dx ok, L-LAD ok, EF 50%;  d.  Lex MV 5/14:  Inferolateral scar, EF 46%, no ischemia  . COPD (chronic obstructive pulmonary disease) ([place])   . Dyspnea   . ETOH abuse   . GERD (gastroesophageal reflux disease)   . Hypercholesterolemia   . Hypertension   . Myocardial infarction ([place])   . Prostate cancer ([place])   . Tobacco abuse    Past Surgical History:  Procedure Laterality Date  . ANTERIOR CERVICAL DECOMPRESSION/DISCECTOMY FUSION 4 LEVELS N/A 07/21/2016   Procedure: ANTERIOR CERVICAL DECOMPRESSION/DISCECTOMY FUSION CERVICAL TWO-THREE, CERVICAL THREE-FOUR,. CERVICAL FOUR-FIVE, CERVICAL  FIVE-SIX;  Surgeon: Ashok Pall, MD;  Location: [facility];  Service: Neurosurgery;  Laterality: N/A;  . BACK SURGERY    . CARDIAC CATHETERIZATION  2011  . CARDIAC SURGERY     Open Heart  . COLONOSCOPY     in remote past, Dr. Sharlett Iles. Obtaining records.   . COLONOSCOPY N/A 11/21/2017   Procedure: COLONOSCOPY;  Surgeon: Daneil Dolin, MD;  [facility];  Service: Endoscopy;  Laterality: N/A;  1:15  . CORONARY ANGIOPLASTY    . CORONARY ARTERY BYPASS GRAFT    . ESOPHAGOGASTRODUODENOSCOPY  05/27/2012   [id number] esophagus, stomach and duodenum s/p dilator  . POLYPECTOMY  11/21/2017   Procedure: POLYPECTOMY;  Surgeon: Daneil Dolin, MD;  [facility];  Service: Endoscopy;;  colon  . PROSTATE SURGERY     Family  History  Problem Relation Age of Onset  . Diabetes Mother   . Hypertension Father   . Heart disease Brother   . Diabetes Brother   . Heart disease Brother   . Diabetes Brother   . Multiple sclerosis Daughter   . Healthy Son   . Colon cancer Neg Hx    Social History   Socioeconomic History  . Marital status: Married    Spouse name: Not on file  . Number of children: Not on file  . Years of education: Not on file  . Highest education level: Not on file  Occupational History  . Not on file  Tobacco Use  . Smoking status: Current Every Day Smoker    Packs/day: 0.50    Years: 50.00    Pack years: 25.00    Types: Cigarettes, E-cigarettes    Start date: 03/08/1970  . Smokeless tobacco: Former Systems developer    Types: Snuff  Quit date: 03/08/2012  Substance and Sexual Activity  . Alcohol use: Yes    Alcohol/week: 6.0 standard drinks    Types: 6 Cans of beer per week    Comment: daily   . Drug use: No  . Sexual activity: Yes    Partners: Female  Other Topics Concern  . Not on file  Social History Narrative  . Not on file   Social Determinants of Health   Financial Resource Strain:   . Difficulty of Paying Living Expenses:   Food Insecurity:   . Worried About Charity fundraiser in the Last Year:   . Arboriculturist in the Last Year:   Transportation Needs:   . Film/video editor (Medical):   Marland Kitchen Lack of Transportation (Non-Medical):   Physical Activity:   . Days of Exercise per Week:   . Minutes of Exercise per Session:   Stress:   . Feeling of Stress :   Social Connections:   . Frequency of Communication with Friends and Family:   . Frequency of Social Gatherings with Friends and Family:   . Attends Religious Services:   . Active Member of Clubs or Organizations:   . Attends Archivist Meetings:   Marland Kitchen Marital Status:     Activities of Daily Living In your present state of health, do you have any difficulty performing the following activities: 11/26/2019  11/19/2019  Hearing? Y Y  Comment - Bilateral hearing aids  Vision? N N  Difficulty concentrating or making decisions? Tempie Donning  Walking or climbing stairs? N N  Dressing or bathing? N N  Doing errands, shopping? N N  Preparing Food and eating ? N -  Using the Toilet? N -  In the past six months, have you accidently leaked urine? N -  Do you have problems with loss of bowel control? N -  Managing your Medications? N -  Managing your Finances? N -  Housekeeping or managing your Housekeeping? N -  Some recent data might be hidden    Patient Education/ Literacy How often do you need to have someone help you when you read instructions, pamphlets, or other written materials from your doctor or pharmacy?: 1 - Never What is the last grade level you completed in school?: 8th grade  Exercise Current Exercise Habits: Home exercise routine, Type of exercise: walking, Time (Minutes): 30, Frequency (Times/Week): 7, Weekly Exercise (Minutes/Week): 210, Intensity: Mild  Diet Patient reports consuming 1 meals a day and 3 snack(s) a day Patient reports that his primary diet is: Regular Patient reports that she does have regular access to food.   Depression Screen PHQ 2/9 Scores 11/26/2019 11/25/2019 10/08/2019 05/12/2019 09/04/2018 05/16/2018 04/24/2018  PHQ - 2 Score 0 0 0 0 0 0 0     Fall Risk Fall Risk  11/26/2019 11/25/2019 10/08/2019 05/12/2019 09/04/2018  Falls in the past year? 0 0 0 0 0  Number falls in past yr: - 0 0 - -  Injury with Fall? - 0 0 - -  Risk for fall due to : - History of fall(s) No Fall Risks - -  Follow up - Falls evaluation completed Falls evaluation completed - -     Objective:  [name] seemed alert and oriented and he participated appropriately during our telephone visit.  Blood Pressure Weight BMI  BP Readings from Last 3 Encounters:  11/25/19 123/72  11/24/19 124/86  10/08/19 135/73   Wt Readings from Last 3 Encounters:  11/25/19 150 lb 12.8 oz (68.4 kg)    11/24/19 150 lb 3 oz (68.1 kg)  11/19/19 150 lb (68 kg)   BMI Readings from Last 1 Encounters:  11/25/19 24.34 kg/m    *Unable to obtain current vital signs, weight, and BMI due to telephone visit type  Hearing/Vision  . Elsa did  seem to have difficulty with hearing/understanding during the telephone conversation . Reports that he has not had a formal eye exam by an eye care professional within the past year . Reports that he has not had a formal hearing evaluation within the past year *Unable to fully assess hearing and vision during telephone visit type  Cognitive Function: 6CIT Screen 11/26/2019  What Year? 0 points  What month? 0 points  What time? 0 points  Count back from 20 0 points  Months in reverse 4 points  Repeat phrase 8 points  Total Score 12   (Normal:0-7, Significant for Dysfunction: >8)  Normal Cognitive Function Screening: No: 12   Immunization & Health Maintenance Record Immunization History  Administered Date(s) Administered  . Fluad Quad(high Dose 65+) 05/12/2019  . Influenza, High Dose Seasonal PF 06/25/2018  . Influenza,inj,Quad PF,6+ Mos 05/08/2016, 07/23/2017  . Moderna SARS-COVID-2 Vaccination 11/06/2019  . Pneumococcal Conjugate-13 01/19/2017  . Pneumococcal Polysaccharide-23 11/27/2012    Health Maintenance  Topic Date Due  . TETANUS/TDAP  05/11/2020 (Originally 08/20/1964)  . COVID-19 Vaccine (2 - Moderna 2-dose series) 12/04/2019  . INFLUENZA VACCINE  03/07/2020  . COLONOSCOPY  11/22/2022  . Hepatitis C Screening  Completed  . PNA vac Low Risk Adult  Completed       Assessment  This is a routine wellness examination for [name].  Health Maintenance: Due or Overdue There are no preventive care reminders to display for this patient.  [name] does not need a referral for Community Assistance: Care Management:   no Social Work:    no Prescription Assistance:  no Nutrition/Diabetes Education:  no   Plan:   Personalized Goals Goals Addressed            This Visit's Progress   . DIET - EAT MORE FRUITS AND VEGETABLES      . Have 3 meals a day        Personalized Health Maintenance & Screening Recommendations   Lung Cancer Screening Recommended: no (Low Dose CT Chest recommended if Age 52-80 years, 30 pack-year currently smoking OR have quit w/in past 15 years) Hepatitis C Screening recommended: no HIV Screening recommended: no  Advanced Directives: Written information was not prepared per patient's request.  Referrals & Orders No orders of the defined types were placed in this encounter.   Follow-up Plan . Follow-up with [name], MD as planned . Schedule 05/26/2020    I have personally reviewed and noted the following in the patient's chart:   . Medical and social history . Use of alcohol, tobacco or illicit drugs  . Current medications and supplements . Functional ability and status . Nutritional status . Physical activity . Advanced directives . List of other physicians . Hospitalizations, surgeries, and ER visits in previous 12 months . Vitals . Screenings to include cognitive, depression, and falls . Referrals and appointments  In addition, I have reviewed and discussed with [name] certain preventive protocols, quality metrics, and best practice recommendations. A written personalized care plan for preventive services as well as general preventive health recommendations is available and can be mailed to the patient  at his request.      Maud Deed [facility]  [id number]

## 2019-11-26 NOTE — Telephone Encounter (Signed)
Pam with [facility] office returned call and said they will cx surgery until pt is seen - will reach out to pt as well

## 2019-11-27 ENCOUNTER — Encounter (HOSPITAL_COMMUNITY): Admission: RE | Payer: Self-pay | Source: Ambulatory Visit

## 2019-11-27 ENCOUNTER — Ambulatory Visit (HOSPITAL_COMMUNITY): Admission: RE | Admit: 2019-11-27 | Payer: Medicare Other | Source: Ambulatory Visit | Admitting: Urology

## 2019-11-27 SURGERY — TRANSURETHRAL RESECTION OF BLADDER TUMOR WITH MITOMYCIN-C
Anesthesia: General | Laterality: Bilateral

## 2019-12-04 ENCOUNTER — Ambulatory Visit: Payer: Medicare Other | Attending: Internal Medicine

## 2019-12-04 DIAGNOSIS — Z23 Encounter for immunization: Secondary | ICD-10-CM

## 2019-12-04 NOTE — Progress Notes (Signed)
   Covid-19 Vaccination Clinic  Name:  [name]    MRN: [id number] DOB: [birth year]  12/04/2019  [name] was observed post Covid-19 immunization for 15 minutes without incident. He was provided with Vaccine Information Sheet and instruction to access the V-Safe system.   [name] was instructed to call 911 with any severe reactions post vaccine: Marland Kitchen Difficulty breathing  . Swelling of face and throat  . A fast heartbeat  . A bad rash all over body  . Dizziness and weakness   Immunizations Administered    Name Date Dose VIS Date Route   Moderna COVID-19 Vaccine 12/04/2019 10:08 AM 0.5 mL 07/2019 Intramuscular   Manufacturer: Moderna   Lot: [id number]   [id number]

## 2019-12-05 ENCOUNTER — Encounter: Payer: Self-pay | Admitting: Cardiology

## 2019-12-05 ENCOUNTER — Telehealth: Payer: Self-pay | Admitting: Cardiology

## 2019-12-05 ENCOUNTER — Ambulatory Visit (INDEPENDENT_AMBULATORY_CARE_PROVIDER_SITE_OTHER): Payer: Medicare Other | Admitting: Cardiology

## 2019-12-05 ENCOUNTER — Other Ambulatory Visit: Payer: Self-pay

## 2019-12-05 VITALS — BP 126/62 | HR 88 | Temp 97.1°F | Ht 66.0 in | Wt 150.0 lb

## 2019-12-05 DIAGNOSIS — I10 Essential (primary) hypertension: Secondary | ICD-10-CM | POA: Diagnosis not present

## 2019-12-05 DIAGNOSIS — I2510 Atherosclerotic heart disease of native coronary artery without angina pectoris: Secondary | ICD-10-CM

## 2019-12-05 DIAGNOSIS — Z01810 Encounter for preprocedural cardiovascular examination: Secondary | ICD-10-CM

## 2019-12-05 DIAGNOSIS — E782 Mixed hyperlipidemia: Secondary | ICD-10-CM | POA: Diagnosis not present

## 2019-12-05 DIAGNOSIS — I4891 Unspecified atrial fibrillation: Secondary | ICD-10-CM | POA: Diagnosis not present

## 2019-12-05 MED ORDER — APIXABAN 5 MG PO TABS: 5.0000 mg | ORAL_TABLET | Freq: Two times a day (BID) | ORAL | 11 refills | Status: DC

## 2019-12-05 NOTE — Progress Notes (Signed)
Clinical Summary [name] is a 74 y.o.male seen today for follow up of the following medical problems.   1. CAD  - prior MI in 1997, prior CABG approx 13 years ago at [facility]  - cath 2011 showed patent grafts. 04/2013 echo shows LVEF 60%  - myoview 12/2012 shows old inferolateral scar, no ischemia    - no recent chest pain, no SOB or DOE - compliant with meds   2. HTN  - he remains compliant with meds - lisionpril stopped by pcp per patient report. He did have some elevation of K after starting. - compliant with meds   3. HL  - labs followed by pcp - compliant with statin  4COPD -followed by pcp   5. AAA screen -negative [place] 06/2016  6. Afib - occasional palpitations, just a few seconds. - he is already on metoprolol - had some hematuria about 1 month ago.  - some blood toilet paper with wiping, colonscopy 11/2017 hemorroids   7. Urology surgery for TURP - postoponed after afib noted during preop  - physically active, heavy yard work regualrly without symptoms. Can walk up a flight of stairs without singificant symptoms.   Past Medical History:  Diagnosis Date  . Anginal pain ([place])   . CAD (coronary artery disease)    a. s/p INF MI 1997;  b. s/p CABG 2001;  c. [place] 11/2009:  3v CAD, S-PDA ok with 40% mid, S-OM ok, S-Dx ok, L-LAD ok, EF 50%;  d.  Lex MV 5/14:  Inferolateral scar, EF 46%, no ischemia  . COPD (chronic obstructive pulmonary disease) ([place])   . Dyspnea   . ETOH abuse   . GERD (gastroesophageal reflux disease)   . Hypercholesterolemia   . Hypertension   . Myocardial infarction ([place])   . Prostate cancer ([place])   . Tobacco abuse      No Known Allergies   Current Outpatient Medications  Medication Sig Dispense Refill  . acetaminophen (TYLENOL) 500 MG tablet Take 1,000 mg by mouth every 6 (six) hours as needed for moderate pain or headache.    . albuterol (VENTOLIN HFA) 108 (90 Base) MCG/ACT inhaler INHALE TWO PUFFS INTO THE  LUNGS EVERY 6 HOURS AS NEEDED FOR  WHEEZING  OR  SHORTNESS  OF  BREATH 54 g 3  . allopurinol (ZYLOPRIM) 100 MG tablet Take 1 tablet (100 mg total) by mouth daily. 90 tablet 1  . amLODipine (NORVASC) 5 MG tablet Take 1 tablet (5 mg total) by mouth daily. 90 tablet 3  . atorvastatin (LIPITOR) 80 MG tablet Take 1 tablet (80 mg total) by mouth daily at 6 PM. 90 tablet 3  . fluticasone (FLONASE) 50 MCG/ACT nasal spray Place 2 sprays into both nostrils daily as needed for allergies. 60 g 5  . Fluticasone-Umeclidin-Vilant (TRELEGY ELLIPTA) 100-62.5-25 MCG/INH AEPB Inhale 1 puff into the lungs every morning. 180 each 1  . ibuprofen (ADVIL,MOTRIN) 200 MG tablet Take 800 mg by mouth every 6 (six) hours as needed for headache or moderate pain.    . metoprolol tartrate (LOPRESSOR) 25 MG tablet Take 0.5 tablets (12.5 mg total) by mouth 2 (two) times daily. 90 tablet 3  . nitroGLYCERIN (NITROSTAT) 0.4 MG SL tablet Place 1 tablet (0.4 mg total) under the tongue every 5 (five) minutes as needed for chest pain. 75 tablet 3  . omeprazole (PRILOSEC) 20 MG capsule Take 1 capsule (20 mg total) by mouth daily. 90 capsule 3  . tamsulosin (  FLOMAX) 0.4 MG CAPS capsule Take 1 capsule (0.4 mg total) by mouth daily. 90 capsule 1  . aspirin 81 MG tablet Take 81 mg by mouth at bedtime.      No current facility-administered medications for this visit.     Past Surgical History:  Procedure Laterality Date  . ANTERIOR CERVICAL DECOMPRESSION/DISCECTOMY FUSION 4 LEVELS N/A 07/21/2016   Procedure: ANTERIOR CERVICAL DECOMPRESSION/DISCECTOMY FUSION CERVICAL TWO-THREE, CERVICAL THREE-FOUR,. CERVICAL FOUR-FIVE, CERVICAL  FIVE-SIX;  Surgeon: Ashok Pall, MD;  Location: [facility];  Service: Neurosurgery;  Laterality: N/A;  . BACK SURGERY    . CARDIAC CATHETERIZATION  2011  . CARDIAC SURGERY     Open Heart  . COLONOSCOPY     in remote past, Dr. Sharlett Iles. Obtaining records.   . COLONOSCOPY N/A 11/21/2017   Procedure: COLONOSCOPY;   Surgeon: Daneil Dolin, MD;  [facility];  Service: Endoscopy;  Laterality: N/A;  1:15  . CORONARY ANGIOPLASTY    . CORONARY ARTERY BYPASS GRAFT    . ESOPHAGOGASTRODUODENOSCOPY  05/27/2012   [id number] esophagus, stomach and duodenum s/p dilator  . POLYPECTOMY  11/21/2017   Procedure: POLYPECTOMY;  Surgeon: Daneil Dolin, MD;  [facility];  Service: Endoscopy;;  colon  . PROSTATE SURGERY       No Known Allergies    Family History  Problem Relation Age of Onset  . Diabetes Mother   . Hypertension Father   . Heart disease Brother   . Diabetes Brother   . Heart disease Brother   . Diabetes Brother   . Multiple sclerosis Daughter   . Healthy Son   . Colon cancer Neg Hx      Social History [name] reports that he has been smoking cigarettes and e-cigarettes. He started smoking about 49 years ago. He has a 25.00 pack-year smoking history. He quit smokeless tobacco use about 7 years ago.  His smokeless tobacco use included snuff. [name] reports current alcohol use of about 6.0 standard drinks of alcohol per week.   Review of Systems CONSTITUTIONAL: No weight loss, fever, chills, weakness or fatigue.  HEENT: Eyes: No visual loss, blurred vision, double vision or yellow sclerae.No hearing loss, sneezing, congestion, runny nose or sore throat.  SKIN: No rash or itching.  CARDIOVASCULAR: per hpi RESPIRATORY: No shortness of breath, cough or sputum.  GASTROINTESTINAL: No anorexia, nausea, vomiting or diarrhea. No abdominal pain or blood.  GENITOURINARY: No burning on urination, no polyuria NEUROLOGICAL: No headache, dizziness, syncope, paralysis, ataxia, numbness or tingling in the extremities. No change in bowel or bladder control.  MUSCULOSKELETAL: No muscle, back pain, joint pain or stiffness.  LYMPHATICS: No enlarged nodes. No history of splenectomy.  PSYCHIATRIC: No history of depression or anxiety.  ENDOCRINOLOGIC: No reports of sweating, cold  or heat intolerance. No polyuria or polydipsia.  Marland Kitchen   Physical Examination Vitals:   12/05/19 1323  BP: 126/62  Pulse: 88  Temp: (!) 97.1 F (36.2 C)  SpO2: 97%   Filed Weights   12/05/19 1323  Weight: 150 lb (68 kg)    Gen: resting comfortably, no acute distress HEENT: no scleral icterus, pupils equal round and reactive, no palptable cervical adenopathy,  CV: irreg, no m/r/g, no jvd Resp: Clear to auscultation bilaterally GI: abdomen is soft, non-tender, non-distended, normal bowel sounds, no hepatosplenomegaly MSK: extremities are warm, no edema.  Skin: warm, no rash Neuro:  no focal deficits Psych: appropriate affect   Diagnostic Studies  11/25/2009:  Cardiac Cath  Findings: Left ventricular angiogram was performed in the RAO projection and  showed normal left ventricular systolic function with ejection  fraction estimated at 50%. Mild mitral regurgitation was noted.  10.Aortic root angiogram was performed and did not show enlargement of  the aortic root.  IMPRESSION:  1. Severe triple-vessel coronary artery disease.  2. Patent bypass grafts 4/4.  3. Low normal left ventricular systolic function.   12/2012 Myoview  Inferolateral scar, no active ischemia, LVEF 46%.   05/01/13 Echo: LVEF 55-60%, grade II diastolic dysfunction, multiple WMAs, mild MR,   06/2016 AAA [place] No aneurysm    Assessment and Plan   1.CAD  - has not been on ACE-I due to prior elevatiion of potassium - no symptoms, continue current meds  2. HTN  - he is at goal, continue current meds  3. HL  - continue statin  4. Afib - new diagnosis during 11/2019 preop - no significant symptoms, appers to be rate controlled on lopressor - CHADS2Vasc score is (age x1, HTN, CAD) 3, start eliquis 5mg  bid and stop asa  5. Preoperative evaluation - tolerates greater than 4 METs without symptoms - recommend proceeding with surgery as planned - hold eliquis 48 hrs prior to  surgery, resume one day after   F/u 2 months. If persistent afib could consider DCCV at that time.   Arnoldo Lenis, M.D.

## 2019-12-05 NOTE — Telephone Encounter (Signed)
Pre-cert Verification for the following procedure    ECHO   DATE: 12/10/2019  LOCATION:   [facility]

## 2019-12-05 NOTE — Patient Instructions (Signed)
Medication Instructions:  Your physician has recommended you make the following change in your medication:  Stop Aspirin  Start Eliquis 5 mg Two Times Daily   *If you need a refill on your cardiac medications before your next appointment, please call your pharmacy*   Lab Work: NONE  If you have labs (blood work) drawn today and your tests are completely normal, you will receive your results only by: Marland Kitchen MyChart Message (if you have MyChart) OR . A paper copy in the mail If you have any lab test that is abnormal or we need to change your treatment, we will call you to review the results.   Testing/Procedures: Your physician has requested that you have an echocardiogram. Echocardiography is a painless test that uses sound waves to create images of your heart. It provides your doctor with information about the size and shape of your heart and how well your heart's chambers and valves are working. This procedure takes approximately one hour. There are no restrictions for this procedure.     Follow-Up: At [facility], you and your health needs are our priority.  As part of our continuing mission to provide you with exceptional heart care, we have created designated Provider Care Teams.  These Care Teams include your primary Cardiologist (physician) and Advanced Practice Providers (APPs -  Physician Assistants and Nurse Practitioners) who all work together to provide you with the care you need, when you need it.  We recommend signing up for the patient portal called "MyChart".  Sign up information is provided on this After Visit Summary.  MyChart is used to connect with patients for Virtual Visits (Telemedicine).  Patients are able to view lab/test results, encounter notes, upcoming appointments, etc.  Non-urgent messages can be sent to your provider as well.   To learn more about what you can do with MyChart, go to [email or web address].    Your next appointment:   2 month(s)  The format  for your next appointment:   In Person  Provider:   [name], MD   Other Instructions Thank you for choosing [facility]!

## 2019-12-09 ENCOUNTER — Ambulatory Visit: Payer: Medicare Other | Admitting: Cardiology

## 2019-12-12 ENCOUNTER — Ambulatory Visit (HOSPITAL_COMMUNITY)
Admission: RE | Admit: 2019-12-12 | Discharge: 2019-12-12 | Disposition: A | Discharge status: Home / Self Care | Payer: Medicare Other | Source: Ambulatory Visit | Attending: Cardiology | Admitting: Cardiology

## 2019-12-12 ENCOUNTER — Other Ambulatory Visit: Payer: Self-pay

## 2019-12-12 DIAGNOSIS — I4891 Unspecified atrial fibrillation: Secondary | ICD-10-CM | POA: Diagnosis not present

## 2019-12-12 NOTE — Progress Notes (Signed)
*  PRELIMINARY RESULTS* Echocardiogram 2D Echocardiogram has been performed.  Leavy Cella 12/12/2019, 1:54 PM

## 2019-12-17 ENCOUNTER — Telehealth: Payer: Self-pay | Admitting: Cardiology

## 2019-12-17 NOTE — Telephone Encounter (Signed)
Pt and wife requesting results of echo. Wife states Eliquis cost $130 a month and that they can't afford at this time. Please advise.

## 2019-12-17 NOTE — Telephone Encounter (Signed)
Cannot afford Eliquis medication   Also asking for test results

## 2019-12-18 NOTE — Telephone Encounter (Signed)
Pt notified of test results

## 2019-12-18 NOTE — Telephone Encounter (Signed)
Just resulted  J Pavan Bring MD 

## 2019-12-23 ENCOUNTER — Other Ambulatory Visit: Payer: Self-pay | Admitting: Urology

## 2019-12-23 ENCOUNTER — Telehealth: Payer: Self-pay | Admitting: Cardiology

## 2019-12-23 NOTE — Telephone Encounter (Signed)
Patient cannot afford Eliquis 5mg .  Patient wants to know what else patient can be put on.patient is having bladder surgery 01/06/2020 with [facility].

## 2019-12-24 ENCOUNTER — Encounter (HOSPITAL_COMMUNITY): Payer: Self-pay

## 2019-12-24 NOTE — Telephone Encounter (Signed)
Eliquis will cost $130/month and was denied for pt assistance - has not taken Eliquis since Friday - do you want me to give him samples of Eliquis until after surgery ?

## 2019-12-24 NOTE — Patient Instructions (Addendum)
DUE TO COVID-19 ONLY ONE VISITOR ARE ALLOWED TO COME WITH YOU AND STAY IN THE WAITING ROOM ONLY DURING PRE OP AND PROCEDURE. THEN TWO VISITORS MAY VISIT WITH YOU IN YOUR PRIVATE ROOM DURING VISITING HOURS ONLY!!   COVID SWAB TESTING MUST BE COMPLETED ON: Friday, Jan 02, 2020  at 2:15pm   [address] [facility] (Must self quarantine after testing. Follow instructions on handout.)             Your procedure is scheduled on: Tuesday, January 06, 2020   Report to [facility] Main  Entrance    Report to admitting at 7:00 AM   Call this number if you have problems the morning of surgery [phone]   Do not eat food or drink liquids :After Midnight.   Oral Hygiene is also important to reduce your risk of infection.                                    Remember - BRUSH YOUR TEETH THE MORNING OF SURGERY WITH YOUR REGULAR TOOTHPASTE   Do NOT smoke after Midnight   Take these medicines the morning of surgery with A SIP OF WATER: Allopurinol, Metoprolol, Omeprazole, Tamsulosin   Use Inhaler morning of surgery   Bring Inhalers day of surgery   May use Flonase morning of surgery if needed                               You may not have any metal on your body including jewelry, and body piercings             Do not wear lotions, powders, perfumes/cologne, or deodorant                          Men may shave face and neck.   Do not bring valuables to the hospital. [place].   Contacts, dentures or bridgework may not be worn into surgery.    Patients discharged the day of surgery will not be allowed to drive home.   Special Instructions: Bring a copy of your healthcare power of attorney and living will documents         the day of surgery if you haven't scanned them in before.              Please read over the following fact sheets you were given: IF YOU HAVE QUESTIONS  ABOUT YOUR PRE OP INSTRUCTIONS PLEASE CALL [phone]   [facility] - Preparing for Surgery Before surgery, you can play an important role.  Because skin is not sterile, your skin needs to be as free of germs as possible.  You can reduce the number of germs on your skin by washing with CHG (chlorahexidine gluconate) soap before surgery.  CHG is an antiseptic cleaner which kills germs and bonds with the skin to continue killing germs even after washing. Please DO NOT use if you have an allergy to CHG or antibacterial soaps.  If your skin becomes reddened/irritated stop using the CHG and inform your nurse when you arrive at Short Stay. Do not shave (including legs and underarms) for at least 48 hours  prior to the first CHG shower.  You may shave your face/neck.  Please follow these instructions carefully:  1.  Shower with CHG Soap the night before surgery and the  morning of surgery.  2.  If you choose to wash your hair, wash your hair first as usual with your normal  shampoo.  3.  After you shampoo, rinse your hair and body thoroughly to remove the shampoo.                             4.  Use CHG as you would any other liquid soap.  You can apply chg directly to the skin and wash.  Gently with a scrungie or clean washcloth.  5.  Apply the CHG Soap to your body ONLY FROM THE NECK DOWN.   Do   not use on face/ open                           Wound or open sores. Avoid contact with eyes, ears mouth and   genitals (private parts).                       Wash face,  Genitals (private parts) with your normal soap.             6.  Wash thoroughly, paying special attention to the area where your    surgery  will be performed.  7.  Thoroughly rinse your body with warm water from the neck down.  8.  DO NOT shower/wash with your normal soap after using and rinsing off the CHG Soap.                9.  Pat yourself dry with a clean towel.            10.  Wear clean pajamas.            11.  Place clean sheets  on your bed the night of your first shower and do not  sleep with pets. Day of Surgery : Do not apply any lotions/deodorants the morning of surgery.  Please wear clean clothes to the hospital/surgery center.  FAILURE TO FOLLOW THESE INSTRUCTIONS MAY RESULT IN THE CANCELLATION OF YOUR SURGERY  PATIENT SIGNATURE_________________________________  NURSE SIGNATURE__________________________________  ________________________________________________________________________

## 2019-12-24 NOTE — Telephone Encounter (Signed)
How much is the eliquis costing him per month? Depending on that we could see if xarelto would be cheaper with his insurance, but typically the best we see is 30 to 40$ a month. If too much then only other option would be coumadin, would not want to start prior to his surgery though on June 1. Does he have enough eliquis to make it to his surgery, if not can we give him some samples, recall he needs to hold eliquis 2 days prior to surgery. Update me on his current costs please    [name] MD

## 2019-12-25 NOTE — Telephone Encounter (Signed)
Pt voiced understanding and will come by office to pick up samples - aware to hold 2 days prior to 6/1 surgery - scheduled with Edrick Oh, RN 6/8

## 2019-12-25 NOTE — Telephone Encounter (Signed)
Yes give elqiuis samples, needs to see [name] in coumadin clinic few days after his surgery. Be sure he stops the eliquis 2 days prior to surgery  J Zaydrian Batta MD

## 2019-12-29 ENCOUNTER — Encounter (HOSPITAL_COMMUNITY): Payer: Self-pay

## 2019-12-29 ENCOUNTER — Encounter (HOSPITAL_COMMUNITY): Payer: Medicare Other

## 2019-12-29 ENCOUNTER — Encounter (HOSPITAL_COMMUNITY)
Admission: RE | Admit: 2019-12-29 | Discharge: 2019-12-29 | Disposition: A | Discharge status: Home / Self Care | Payer: Medicare Other | Source: Ambulatory Visit | Attending: Urology | Admitting: Urology

## 2019-12-29 ENCOUNTER — Other Ambulatory Visit: Payer: Self-pay

## 2019-12-29 HISTORY — DX: Neoplasm of unspecified behavior of bladder: D49.4

## 2019-12-29 HISTORY — DX: Benign prostatic hyperplasia without lower urinary tract symptoms: N40.0

## 2019-12-29 HISTORY — DX: Personal history of other diseases of the musculoskeletal system and connective tissue: Z87.39

## 2019-12-29 NOTE — Progress Notes (Signed)
PCP - Dr. Cherrie Distance last office visit 11/11/19 Cardiologist - Dr. [name] last office visit and clearance 12/05/19 in epic  Chest x-ray - greater than 1 year ago EKG - 12/05/19 in epic Stress Test - Greater than 2 years ECHO - 12/12/19 in epic Cardiac Cath - greater than 2 years  Sleep Study - N/A CPAP - N/A  Fasting Blood Sugar - N/A Checks Blood Sugar _N/A____ times a day  Blood Thinner Instructions: Eliquis will stop 2 days prior to surgery, patient ans wife verbalized information Aspirin Instructions:N/A Last Dose:N/A  Anesthesia review:  MI, CABG, COPD, Afib/A. Flutter  Patient denies shortness of breath, fever, cough and chest pain at PAT appointment   Patient verbalized understanding of instructions that were given to them at the PAT appointment. Patient was also instructed that they will need to review over the PAT instructions again at home before surgery.

## 2020-01-02 ENCOUNTER — Encounter (HOSPITAL_COMMUNITY)
Admission: RE | Admit: 2020-01-02 | Discharge: 2020-01-02 | Disposition: A | Discharge status: Home / Self Care | Payer: Medicare Other | Source: Ambulatory Visit | Attending: Urology | Admitting: Urology

## 2020-01-02 ENCOUNTER — Other Ambulatory Visit (HOSPITAL_COMMUNITY)
Admission: RE | Admit: 2020-01-02 | Discharge: 2020-01-02 | Disposition: A | Discharge status: Home / Self Care | Payer: Medicare Other | Source: Ambulatory Visit | Attending: Urology | Admitting: Urology

## 2020-01-02 ENCOUNTER — Other Ambulatory Visit: Payer: Self-pay

## 2020-01-02 DIAGNOSIS — Z01812 Encounter for preprocedural laboratory examination: Secondary | ICD-10-CM | POA: Diagnosis not present

## 2020-01-02 DIAGNOSIS — Z20822 Contact with and (suspected) exposure to covid-19: Secondary | ICD-10-CM | POA: Insufficient documentation

## 2020-01-02 LAB — CBC
HCT: 40.5 % (ref 39.0–52.0)
Hemoglobin: 13.4 g/dL (ref 13.0–17.0)
MCH: 33.3 pg (ref 26.0–34.0)
MCHC: 33.1 g/dL (ref 30.0–36.0)
MCV: 100.7 fL — ABNORMAL HIGH (ref 80.0–100.0)
Platelets: 191 10*3/uL (ref 150–400)
RBC: 4.02 MIL/uL — ABNORMAL LOW (ref 4.22–5.81)
RDW: 13.2 % (ref 11.5–15.5)
WBC: 5.8 10*3/uL (ref 4.0–10.5)
nRBC: 0 % (ref 0.0–0.2)

## 2020-01-02 LAB — BASIC METABOLIC PANEL
Anion gap: 8 (ref 5–15)
BUN: 14 mg/dL (ref 8–23)
CO2: 26 mmol/L (ref 22–32)
Calcium: 8.7 mg/dL — ABNORMAL LOW (ref 8.9–10.3)
Chloride: 107 mmol/L (ref 98–111)
Creatinine, Ser: 1.02 mg/dL (ref 0.61–1.24)
GFR calc Af Amer: >60 mL/min (ref >60)
GFR calc non Af Amer: >60 mL/min (ref >60)
Glucose, Bld: 93 mg/dL (ref 70–99)
Potassium: 4.5 mmol/L (ref 3.5–5.1)
Sodium: 141 mmol/L (ref 135–145)

## 2020-01-02 LAB — SARS CORONAVIRUS 2 (TAT 6-24 HRS): SARS Coronavirus 2: NEGATIVE

## 2020-01-02 NOTE — Anesthesia Preprocedure Evaluation (Addendum)
Anesthesia Evaluation  Patient identified by MRN, date of birth, ID band Patient awake    Reviewed: Allergy & Precautions, NPO status , Patient's Chart, lab work & pertinent test results  Airway Mallampati: I  TM Distance: >3 FB Neck ROM: Full    Dental   Pulmonary COPD, Current Smoker,    Pulmonary exam normal        Cardiovascular METS: hypertension, Pt. on medications + CAD, + Past MI and + CABG  Normal cardiovascular exam+ dysrhythmias Atrial Fibrillation      Neuro/Psych    GI/Hepatic GERD  Medicated and Controlled,  Endo/Other    Renal/GU      Musculoskeletal   Abdominal   Peds  Hematology   Anesthesia Other Findings   Reproductive/Obstetrics                           Anesthesia Physical Anesthesia Plan  ASA: III  Anesthesia Plan: General   Post-op Pain Management:    Induction: Intravenous  PONV Risk Score and Plan: Midazolam and Ondansetron  Airway Management Planned: Oral ETT  Additional Equipment:   Intra-op Plan:   Post-operative Plan: Extubation in OR  Informed Consent: I have reviewed the patients History and Physical, chart, labs and discussed the procedure including the risks, benefits and alternatives for the proposed anesthesia with the patient or authorized representative who has indicated his/her understanding and acceptance.       Plan Discussed with: CRNA and Surgeon  Anesthesia Plan Comments: (Per cardiologist, Dr. [name], 12/05/19, "Preoperative evaluation - tolerates greater than 4 METs without symptoms - recommend proceeding with surgery as planned - hold eliquis 48 hrs prior to surgery, resume one day after")      Anesthesia Quick Evaluation

## 2020-01-06 ENCOUNTER — Encounter (HOSPITAL_COMMUNITY): Payer: Self-pay | Admitting: Urology

## 2020-01-06 ENCOUNTER — Ambulatory Visit (HOSPITAL_COMMUNITY)
Admission: RE | Admit: 2020-01-06 | Discharge: 2020-01-06 | Disposition: A | Discharge status: Home / Self Care | Payer: Medicare Other | Source: Ambulatory Visit | Attending: Urology | Admitting: Urology

## 2020-01-06 ENCOUNTER — Encounter (HOSPITAL_COMMUNITY)
Admission: RE | Disposition: A | Discharge status: Home / Self Care | Payer: Self-pay | Source: Ambulatory Visit | Attending: Urology

## 2020-01-06 ENCOUNTER — Ambulatory Visit (HOSPITAL_COMMUNITY): Payer: Medicare Other | Admitting: Physician Assistant

## 2020-01-06 ENCOUNTER — Ambulatory Visit (HOSPITAL_COMMUNITY): Payer: Medicare Other | Admitting: Certified Registered Nurse Anesthetist

## 2020-01-06 ENCOUNTER — Other Ambulatory Visit: Payer: Self-pay

## 2020-01-06 DIAGNOSIS — I2510 Atherosclerotic heart disease of native coronary artery without angina pectoris: Secondary | ICD-10-CM | POA: Diagnosis not present

## 2020-01-06 DIAGNOSIS — J449 Chronic obstructive pulmonary disease, unspecified: Secondary | ICD-10-CM | POA: Diagnosis not present

## 2020-01-06 DIAGNOSIS — Z955 Presence of coronary angioplasty implant and graft: Secondary | ICD-10-CM | POA: Insufficient documentation

## 2020-01-06 DIAGNOSIS — F17210 Nicotine dependence, cigarettes, uncomplicated: Secondary | ICD-10-CM | POA: Diagnosis not present

## 2020-01-06 DIAGNOSIS — Z8546 Personal history of malignant neoplasm of prostate: Secondary | ICD-10-CM | POA: Insufficient documentation

## 2020-01-06 DIAGNOSIS — I252 Old myocardial infarction: Secondary | ICD-10-CM | POA: Insufficient documentation

## 2020-01-06 DIAGNOSIS — C678 Malignant neoplasm of overlapping sites of bladder: Secondary | ICD-10-CM | POA: Insufficient documentation

## 2020-01-06 DIAGNOSIS — K219 Gastro-esophageal reflux disease without esophagitis: Secondary | ICD-10-CM | POA: Insufficient documentation

## 2020-01-06 DIAGNOSIS — I700 Atherosclerosis of aorta: Secondary | ICD-10-CM | POA: Diagnosis not present

## 2020-01-06 DIAGNOSIS — Z79899 Other long term (current) drug therapy: Secondary | ICD-10-CM | POA: Diagnosis not present

## 2020-01-06 DIAGNOSIS — I10 Essential (primary) hypertension: Secondary | ICD-10-CM | POA: Diagnosis not present

## 2020-01-06 DIAGNOSIS — N3289 Other specified disorders of bladder: Secondary | ICD-10-CM | POA: Insufficient documentation

## 2020-01-06 DIAGNOSIS — D090 Carcinoma in situ of bladder: Secondary | ICD-10-CM | POA: Diagnosis not present

## 2020-01-06 DIAGNOSIS — Z951 Presence of aortocoronary bypass graft: Secondary | ICD-10-CM | POA: Diagnosis not present

## 2020-01-06 DIAGNOSIS — E785 Hyperlipidemia, unspecified: Secondary | ICD-10-CM | POA: Diagnosis not present

## 2020-01-06 DIAGNOSIS — M109 Gout, unspecified: Secondary | ICD-10-CM | POA: Insufficient documentation

## 2020-01-06 DIAGNOSIS — D494 Neoplasm of unspecified behavior of bladder: Secondary | ICD-10-CM | POA: Diagnosis not present

## 2020-01-06 DIAGNOSIS — C679 Malignant neoplasm of bladder, unspecified: Secondary | ICD-10-CM

## 2020-01-06 DIAGNOSIS — C670 Malignant neoplasm of trigone of bladder: Secondary | ICD-10-CM | POA: Diagnosis not present

## 2020-01-06 DIAGNOSIS — C672 Malignant neoplasm of lateral wall of bladder: Secondary | ICD-10-CM | POA: Diagnosis not present

## 2020-01-06 DIAGNOSIS — E7800 Pure hypercholesterolemia, unspecified: Secondary | ICD-10-CM | POA: Insufficient documentation

## 2020-01-06 HISTORY — DX: Malignant neoplasm of bladder, unspecified: C67.9

## 2020-01-06 HISTORY — PX: TRANSURETHRAL RESECTION OF BLADDER TUMOR WITH MITOMYCIN-C: SHX6459

## 2020-01-06 SURGERY — TRANSURETHRAL RESECTION OF BLADDER TUMOR WITH MITOMYCIN-C
Anesthesia: General | Laterality: Left

## 2020-01-06 MED ORDER — GEMCITABINE CHEMO FOR BLADDER INSTILLATION 2000 MG
2000.0000 mg | Freq: Once | INTRAVENOUS | Status: AC
  Administered 2020-01-06: 2000 mg via INTRAVESICAL
  Filled 2020-01-06: qty 2000

## 2020-01-06 MED ORDER — 0.9 % SODIUM CHLORIDE (POUR BTL) OPTIME
TOPICAL | Status: DC | PRN
  Administered 2020-01-06: 1000 mL

## 2020-01-06 MED ORDER — SODIUM CHLORIDE 0.9 % IR SOLN
Status: DC | PRN
  Administered 2020-01-06: 6000 mL

## 2020-01-06 MED ORDER — MEPERIDINE HCL 50 MG/ML IJ SOLN: 6.2500 mg | INTRAMUSCULAR | Status: DC | PRN

## 2020-01-06 MED ORDER — FENTANYL CITRATE (PF) 100 MCG/2ML IJ SOLN
INTRAMUSCULAR | Status: AC
  Filled 2020-01-06: qty 2

## 2020-01-06 MED ORDER — PHENYLEPHRINE HCL-NACL 10-0.9 MG/250ML-% IV SOLN
INTRAVENOUS | Status: DC | PRN
  Administered 2020-01-06: 50 ug/min via INTRAVENOUS

## 2020-01-06 MED ORDER — HYDROMORPHONE HCL 1 MG/ML IJ SOLN: 0.2500 mg | INTRAMUSCULAR | Status: DC | PRN

## 2020-01-06 MED ORDER — LIDOCAINE 2% (20 MG/ML) 5 ML SYRINGE
INTRAMUSCULAR | Status: DC | PRN
  Administered 2020-01-06: 100 mg via INTRAVENOUS

## 2020-01-06 MED ORDER — LIDOCAINE 2% (20 MG/ML) 5 ML SYRINGE
INTRAMUSCULAR | Status: AC
  Filled 2020-01-06: qty 5

## 2020-01-06 MED ORDER — ONDANSETRON HCL 4 MG/2ML IJ SOLN
INTRAMUSCULAR | Status: DC | PRN
  Administered 2020-01-06: 4 mg via INTRAVENOUS

## 2020-01-06 MED ORDER — CEFAZOLIN SODIUM-DEXTROSE 2-4 GM/100ML-% IV SOLN
2.0000 g | INTRAVENOUS | Status: AC
  Administered 2020-01-06: 2 g via INTRAVENOUS
  Filled 2020-01-06: qty 100

## 2020-01-06 MED ORDER — ROCURONIUM BROMIDE 10 MG/ML (PF) SYRINGE
PREFILLED_SYRINGE | INTRAVENOUS | Status: DC | PRN
  Administered 2020-01-06: 80 mg via INTRAVENOUS

## 2020-01-06 MED ORDER — DEXAMETHASONE SODIUM PHOSPHATE 10 MG/ML IJ SOLN
INTRAMUSCULAR | Status: AC
  Filled 2020-01-06: qty 1

## 2020-01-06 MED ORDER — SUGAMMADEX SODIUM 200 MG/2ML IV SOLN
INTRAVENOUS | Status: DC | PRN
  Administered 2020-01-06: 400 mg via INTRAVENOUS

## 2020-01-06 MED ORDER — LACTATED RINGERS IV SOLN: INTRAVENOUS | Status: DC

## 2020-01-06 MED ORDER — ROCURONIUM BROMIDE 10 MG/ML (PF) SYRINGE
PREFILLED_SYRINGE | INTRAVENOUS | Status: AC
  Filled 2020-01-06: qty 10

## 2020-01-06 MED ORDER — DEXAMETHASONE SODIUM PHOSPHATE 10 MG/ML IJ SOLN
INTRAMUSCULAR | Status: DC | PRN
  Administered 2020-01-06: 10 mg via INTRAVENOUS

## 2020-01-06 MED ORDER — SUGAMMADEX SODIUM 500 MG/5ML IV SOLN
INTRAVENOUS | Status: AC
  Filled 2020-01-06: qty 5

## 2020-01-06 MED ORDER — PROPOFOL 10 MG/ML IV BOLUS
INTRAVENOUS | Status: AC
  Filled 2020-01-06: qty 20

## 2020-01-06 MED ORDER — SODIUM CHLORIDE 0.9% FLUSH: 3.0000 mL | Freq: Two times a day (BID) | INTRAVENOUS | Status: DC

## 2020-01-06 MED ORDER — PROPOFOL 10 MG/ML IV BOLUS
INTRAVENOUS | Status: DC | PRN
  Administered 2020-01-06: 150 mg via INTRAVENOUS

## 2020-01-06 MED ORDER — PHENYLEPHRINE HCL (PRESSORS) 10 MG/ML IV SOLN
INTRAVENOUS | Status: AC
  Filled 2020-01-06: qty 1

## 2020-01-06 MED ORDER — FENTANYL CITRATE (PF) 250 MCG/5ML IJ SOLN
INTRAMUSCULAR | Status: DC | PRN
  Administered 2020-01-06: 100 ug via INTRAVENOUS

## 2020-01-06 MED ORDER — ONDANSETRON HCL 4 MG/2ML IJ SOLN
INTRAMUSCULAR | Status: AC
  Filled 2020-01-06: qty 2

## 2020-01-06 MED ORDER — ONDANSETRON HCL 4 MG/2ML IJ SOLN: 4.0000 mg | Freq: Once | INTRAMUSCULAR | Status: DC | PRN

## 2020-01-06 MED ORDER — LACTATED RINGERS IV SOLN: INTRAVENOUS | Status: DC | PRN

## 2020-01-06 SURGICAL SUPPLY — 19 items
BAG DRN RND TRDRP ANRFLXCHMBR (UROLOGICAL SUPPLIES) ×1
BAG URINE DRAIN 2000ML AR STRL (UROLOGICAL SUPPLIES) ×1 IMPLANT
BAG URO CATCHER STRL LF (MISCELLANEOUS) ×2 IMPLANT
CATH FOLEY 2WAY SLVR  5CC 20FR (CATHETERS) ×2
CATH FOLEY 2WAY SLVR 5CC 20FR (CATHETERS) IMPLANT
CATH FOLEY 3WAY 30CC 22FR (CATHETERS) IMPLANT
CATH URET 5FR 28IN OPEN ENDED (CATHETERS) IMPLANT
GLOVE SURG SS PI 8.0 STRL IVOR (GLOVE) IMPLANT
GOWN STRL REUS W/TWL XL LVL3 (GOWN DISPOSABLE) ×3 IMPLANT
HOLDER FOLEY CATH W/STRAP (MISCELLANEOUS) IMPLANT
KIT TURNOVER KIT A (KITS) IMPLANT
LOOP CUT BIPOLAR 24F LRG (ELECTROSURGICAL) ×1 IMPLANT
MANIFOLD NEPTUNE II (INSTRUMENTS) ×2 IMPLANT
SYR 30ML LL (SYRINGE) IMPLANT
SYR TOOMEY IRRIG 70ML (MISCELLANEOUS) ×2
SYRINGE TOOMEY IRRIG 70ML (MISCELLANEOUS) IMPLANT
TRAY CYSTO PACK (CUSTOM PROCEDURE TRAY) ×2 IMPLANT
TUBING CONNECTING 10 (TUBING) ×2 IMPLANT
TUBING UROLOGY SET (TUBING) ×2 IMPLANT

## 2020-01-06 NOTE — Anesthesia Procedure Notes (Signed)
Procedure Name: Intubation Date/Time: 01/06/2020 9:30 AM Performed by: Mitzie Na, CRNA Pre-anesthesia Checklist: Patient identified, Emergency Drugs available, Suction available and Patient being monitored Patient Re-evaluated:Patient Re-evaluated prior to induction Oxygen Delivery Method: Circle system utilized Preoxygenation: Pre-oxygenation with 100% oxygen Induction Type: IV induction Ventilation: Mask ventilation without difficulty and Oral airway inserted - appropriate to patient size Laryngoscope Size: Mac and 3 Grade View: Grade I Tube type: Oral Tube size: 7.5 mm Number of attempts: 1 Airway Equipment and Method: Stylet and Oral airway Placement Confirmation: ETT inserted through vocal cords under direct vision,  positive ETCO2 and breath sounds checked- equal and bilateral Secured at: 24 cm Tube secured with: Tape Dental Injury: Teeth and Oropharynx as per pre-operative assessment  Comments: Intubation by EMT student Juliane Lack

## 2020-01-06 NOTE — H&P (Signed)
I have blood in my urine.     [name] returns for cystoscopy to complete the hematuria w/u. The CT showed and irregularity at the bladder base that was worrisome for a neoplasm. His UA is clear today.   CT IMPRESSION:  1. Endoluminal nodule in the posterior right urinary bladder  adjacent to the right ureterovesicular junction measuring 8 mm, is  suspicious for malignancy although may reflect adherent clot.  Cystoscopy is recommended for further evaluation.  2. Nonobstructive right inferior pole nephrolithiasis versus  vascular calcification. No other urinary tract calculi identified.  No hydronephrosis.  3. No suspicious renal lesions.  4. Prostate brachytherapy pellets.  5. Subendocardial scarring of the lateral wall of the left  ventricle, in keeping with prior infarction.  6. Aortic Atherosclerosis (ICD10-I70.0).     GU Hx: [name] is sent back in consultation by Dr. Livia Snellen for a 3 day history of hematuria with clots last week. It has subsequently cleared. He has a history of Gleason 6 prostate cancer that was treated with seeds in 2009. His PSA was <0.1 on 10/08/19 and his Cr was 1.03. His UA had 3+ blood but the culture was negative. he has had no upper tract studies recently. He had no flank pain or hematuria. His UA is clear today. he has had no history of stones or UTI's. He remains on tamsulosin for LUTS and his IPSS is 8.    ALLERGIES: No Allergies    MEDICATIONS: Allopurinol 300 mg tablet Oral  Amlodipine Besylate 5 mg tablet Oral  Atorvastatin Calcium 80 mg tablet Oral  Metoprolol Tartrate 75 mg tablet Oral  Omega 3 684 mg-1,200 mg capsule,delayed release Oral  Tamsulosin Hcl 0.4 mg capsule Oral  Trelegy Ellipta     GU PSH: Locm 300-399Mg /Ml Iodine,1Ml - 10/16/2019       PSH Notes: Prostate Surgery, Coronary Angiography, W/O Concomitant Left Heart Catheteriz, Excision Of External Thrombosed Hemorrhoids, Back Surgery, Coronary Artery Bypass Graft (CABG)   NON-GU  PSH: Back Surgery (Unspecified) CABG (coronary artery bypass grafting) - 2009 Cardiac Stent Placement - 2011 Heart Surgery (Unspecified) - 2000, 1998 Neck Surgery (Unspecified) - 2018 Remove Hemorrhoid Clot - 2009     GU PMH: Gross hematuria, There are a couple of possible lesions in the bladder. I will send a cytology today and have him return in a week for cystoscopy. - 10/16/2019 Renal cyst, He has smalll right renal cysts that appear simple. - 10/16/2019 History of prostate cancer, Personal history of prostate cancer - 2017 Weak Urinary Stream, Weak urinary stream - 2017 ED due to arterial insufficiency, Erectile dysfunction due to arterial insufficiency - 2014 Nocturia, Nocturia - 2014 Prostate nodule w/o LUTS, Nodular prostate without lower urinary tract symptoms - 2014 Urge incontinence, Urge Incontinence Of Urine - 2014 Urinary Frequency, Increased urinary frequency - 2014 Urinary Retention, Unspec, Incomplete bladder emptying - 2014 Urinary Urgency, Urinary urgency - 2014      PMH Notes:  2008-02-27 11:58:54 - Note: Acute Myocardial Infarction  COPD   NON-GU PMH: Encounter for general adult medical examination without abnormal findings, Encounter for preventive health examination - 2017 Myocardial Infarction, History of myocardial infarction - 2017 Personal history of other diseases of the circulatory system, History of cardiac disorder - 2017, History of hypertension, - 2014 Personal history of other diseases of the digestive system, History of esophageal reflux - 2017 Personal history of other endocrine, nutritional and metabolic disease, History of hypercholesterolemia - 2017, History of hyperlipidemia, - 2014 Gout,  Gout - 2014 COPD GERD Heart disease, unspecified Heart failure, unspecified    FAMILY HISTORY: 1 Daughter - Other 1 son - Other Blood In Urine - Brother cardiac disorder - Runs In Family Coronary Artery Disease - Runs In Family Diabetes - Runs In  Family father deceased - Other Kidney Cancer - Brother mother deceased - Other nephrolithiasis - Runs In Family Prostate Cancer - No Family History   SOCIAL HISTORY: Marital Status: Married Current Smoking Status: Patient smokes. Has smoked since 10/06/1966. Smokes 1/2 pack per day.   Tobacco Use Assessment Completed: Used Tobacco in last 30 days? Drinks 7 drinks per day.  Drinks 3 caffeinated drinks per day. Patient's occupation is/was Retired.     Notes: Alcohol use, Number of children, Current smoker, Retired, Caffeine use   REVIEW OF SYSTEMS:    GU Review Male:   Patient denies frequent urination, hard to postpone urination, burning/ pain with urination, get up at night to urinate, leakage of urine, stream starts and stops, trouble starting your stream, have to strain to urinate , erection problems, and penile pain.  Gastrointestinal (Upper):   Patient denies nausea, vomiting, and indigestion/ heartburn.  Gastrointestinal (Lower):   Patient denies constipation and diarrhea.  Constitutional:   Patient denies fever, night sweats, weight loss, and fatigue.  Skin:   Patient denies skin rash/ lesion and itching.  Eyes:   Patient denies blurred vision and double vision.  Ears/ Nose/ Throat:   Patient denies sore throat and sinus problems.  Hematologic/Lymphatic:   Patient denies swollen glands and easy bruising.  Cardiovascular:   Patient denies leg swelling and chest pains.  Respiratory:   Patient denies cough and shortness of breath.  Endocrine:   Patient denies excessive thirst.  Musculoskeletal:   Patient denies back pain and joint pain.  Neurological:   Patient denies headaches and dizziness.  Psychologic:   Patient denies depression and anxiety.   VITAL SIGNS:      10/28/2019 02:38 PM  BP 147/78 mmHg  Heart Rate 64 /min  Temperature 97.8 F / 36.5 C   PAST DATA REVIEWED:  Source Of History:  Patient  Records Review:   Previous Patient Records  Urine Test Review:    Urinalysis   PROCEDURES:         Flexible Cystoscopy - 52000  Risks, benefits, and some of the potential complications of the procedure were discussed. 42ml of 2% lidocaine jelly was instilled intraurethrally.  Cipro 500mg  given for antibiotic prophylaxis.     Meatus:  Normal size. Normal location. Normal condition.  Urethra:  No strictures.  External Sphincter:  Normal.  Verumontanum:  Normal.  Prostate:  Borderline obstructing. Mild hyperplasia. some mucosal blanching  Bladder Neck:  Non-obstructing.  Ureteral Orifices:  Normal location. Normal size. Normal shape. Effluxed clear urine.  Bladder:  Mild trabeculation. A few trigone tumors. 1 cm tumor. Normal mucosa. No stones.      The procedure was well tolerated and there were no complications.         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 The angle of view was difficult so I am not certain of the number. One on the left seemed near the trigone. He will need a TURBT with instillation of gemcitabine and I have reviewed the risks of bleeding, infection, ureteral and bladder injury, strictures, chemical cystitis, need for a stent, thrombotic events and anesthetic complications.      PLAN:           Schedule Return Visit/Planned Activity: ASAP - Schedule Surgery

## 2020-01-06 NOTE — Op Note (Signed)
Procedure: 1.  Cystoscopy with bladder biopsy. 2.  Transurethral resection of medium bladder tumor from right lateral wall. 3.  Left ureteroscopy. 4.  Instillation of gemcitabine in PACU.  Preop diagnosis: Bladder tumors.  Postop diagnosis: Medium bladder tumor of the right bladder wall and possible carcinoma in situ of the left trigone.  Surgeon: Dr. Irine Seal.  Anesthesia: General.  Specimen: Left trigone bladder biopsy and right lateral wall tumor chips.  EBL: 2 mL.  Drain: 54 [place] Foley catheter.  Complications: None.  Indications: The patient is a 74 year old white male who was recently found on cystoscopy to have right lateral wall bladder tumor and a small area of concern near the left trigone.  He is to undergo resection of the tumors today.  Procedure: He was given 2 g of Ancef.  A general anesthetic was induced.  He was placed in lithotomy position and fitted with PAS hose.  His perineum and genitalia were prepped with Betadine solution he was draped in usual sterile fashion.  Cystoscopy was performed using a 23 [place] scope and 30 degree lens.  Examination revealed a normal urethra.  The external sphincter was intact.  Prostatic urethra was approximately 3 cm in length with mild lateral lobe hyperplasia without significant obstruction.  Examination of bladder demonstrated mild trabeculation.  Lateral to the right ureteral orifice was a bladder tumor that had about a 1 to 1.5 cm central papillary component with some changes around the base extending the diameter to approximately 2-1/2 cm.  Adjacent to the left trigone with some cobblestoning of the mucosa that is concerning for neoplastic change, possible carcinoma in situ.  No additional papillary tumors were seen.  Ureteral orifices were otherwise unremarkable.  A cup biopsy forceps was used to take a biopsy from the left trigone lesion.  The cystoscope was then removed and a 28 French continuous-flow resectoscope sheath was  inserted after calibration of the urethra to 30 [place].  Resectoscope was fitted with an Beatrix Fetters handle and a bipolar loop.  Saline was used to the irrigant.  The abnormal mucosa and biopsy site in the left trigone were fulgurated.  I then resected the lesion on the right lateral wall down in the muscle and then fulgurated the base and surrounding mucosa.  The chips were then retrieved.  I then removed the resectoscope and inserted the 4.5 French semirigid rigid ureteroscope.  I was able to advance the ureteroscope up the left ureteral orifice and there was no evidence of intraluminal tumor.  Ureteroscope was removed.  The cystoscope was reinserted to assess for hemostasis.  No bleeding was noted from the biopsy sites and there was only mild bleeding from mucosal irritation in the prostatic urethra that was not felt to require cauterization.  The cystoscope was removed and a 20 French Foley catheter was inserted.  The balloon was filled with 10 mL of sterile fluid.  The cath was placed to straight drainage.  He was taken down from lithotomy position, his anesthetic was reversed and he was moved recovery in stable condition.  In the PACU his bladder was instilled with 2000 mg of gemcitabine and 50 mL of diluent.  The catheter was plugged and the medicine was left indwelling for 1 hour.  The bladder was then drained and then flushed with normal saline.  The catheter was removed.  There were no complications.

## 2020-01-06 NOTE — Discharge Instructions (Signed)
Transurethral Resection of Bladder Tumor, Care After This sheet gives you information about how to care for yourself after your procedure. Your health care provider may also give you more specific instructions. If you have problems or questions, contact your health care provider. What can I expect after the procedure? After the procedure, it is common to have:  A small amount of blood in your urine for up to 2 weeks.  Soreness or mild pain from your catheter. After your catheter is removed, you may have mild soreness, especially when urinating.  Pain in your lower abdomen. Follow these instructions at home: Medicines   Take over-the-counter and prescription medicines only as told by your health care provider.  If you were prescribed an antibiotic medicine, take it as told by your health care provider. Do not stop taking the antibiotic even if you start to feel better.  Do not drive for 24 hours if you were given a sedative during your procedure.  Ask your health care provider if the medicine prescribed to you: ? Requires you to avoid driving or using heavy machinery. ? Can cause constipation. You may need to take these actions to prevent or treat constipation:  Take over-the-counter or prescription medicines.  Eat foods that are high in fiber, such as beans, whole grains, and fresh fruits and vegetables.  Limit foods that are high in fat and processed sugars, such as fried or sweet foods. Activity  Return to your normal activities as told by your health care provider. Ask your health care provider what activities are safe for you.  Do not lift anything that is heavier than 10 lb (4.5 kg), or the limit that you are told, until your health care provider says that it is safe.  Avoid intense physical activity for as long as told by your health care provider.  Rest as told by your health care provider.  Avoid sitting for a long time without moving. Get up to take short walks every  1-2 hours. This is important to improve blood flow and breathing. Ask for help if you feel weak or unsteady. General instructions   Do not drink alcohol for as long as told by your health care provider. This is especially important if you are taking prescription pain medicines.  Do not take baths, swim, or use a hot tub until your health care provider approves. Ask your health care provider if you may take showers. You may only be allowed to take sponge baths.  If you have a catheter, follow instructions from your health care provider about caring for your catheter and your drainage bag.  Drink enough fluid to keep your urine pale yellow.  Wear compression stockings as told by your health care provider. These stockings help to prevent blood clots and reduce swelling in your legs.  Keep all follow-up visits as told by your health care provider. This is important. ? You will need to be followed closely with regular checks of your bladder and urethra (cystoscopies) to make sure that the cancer does not come back. Contact a health care provider if:  You have pain that gets worse or does not improve with medicine.  You have blood in your urine for more than 2 weeks.  You have cloudy or bad-smelling urine.  You become constipated. Signs of constipation may include having: ? Fewer than three bowel movements in a week. ? Difficulty having a bowel movement. ? Stools that are dry, hard, or larger than normal.  You have  a fever. Get help right away if:  You have: ? Severe pain. ? Bright red blood in your urine. ? Blood clots in your urine. ? A lot of blood in your urine.  Your catheter has been removed and you are not able to urinate.  You have a catheter in place and the catheter is not draining urine. Summary  After your procedure, it is common to have a small amount of blood in your urine, soreness or mild pain from your catheter, and pain in your lower abdomen.  Take  over-the-counter and prescription medicines only as told by your health care provider.  Rest as told by your health care provider. Follow your health care provider's instructions about returning to normal activities. Ask what activities are safe for you.  If you have a catheter, follow instructions from your health care provider about caring for your catheter and your drainage bag.  Get help right away if you cannot urinate, you have severe pain, or you have bright red blood or blood clots in your urine.  You may resume Eliquis in 3 days if the urine is clear.   This information is not intended to replace advice given to you by your health care provider. Make sure you discuss any questions you have with your health care provider. Document Revised: 02/21/2018 Document Reviewed: 02/21/2018 Elsevier Patient Education  [place].

## 2020-01-06 NOTE — Anesthesia Postprocedure Evaluation (Signed)
Anesthesia Post Note  Patient: PRIMUS MCNAMARA  Procedure(s) Performed: CYSTOSCOPY TRANSURETHRAL RESECTION OF BLADDER TUMOR,LEFT URETERSCOPY AND GEMCITABINE (Left )     Patient location during evaluation: PACU Anesthesia Type: General Level of consciousness: awake and alert Pain management: pain level controlled Vital Signs Assessment: post-procedure vital signs reviewed and stable Respiratory status: spontaneous breathing, nonlabored ventilation, respiratory function stable and patient connected to nasal cannula oxygen Cardiovascular status: blood pressure returned to baseline and stable Postop Assessment: no apparent nausea or vomiting Anesthetic complications: no    Last Vitals:  Vitals:   01/06/20 1200 01/06/20 1209  BP: 128/82 (!) 151/88  Pulse: 72 80  Resp: 18 16  Temp: 36.8 C 36.8 C  SpO2: 97% 98%    Last Pain:  Vitals:   01/06/20 1209  TempSrc: Oral  PainSc: 0-No pain                 Donique Hammonds DAVID

## 2020-01-06 NOTE — Transfer of Care (Signed)
Immediate Anesthesia Transfer of Care Note  Patient: [name]  Procedure(s) Performed: CYSTOSCOPY TRANSURETHRAL RESECTION OF BLADDER TUMOR,LEFT URETERSCOPY AND GEMCITABINE (Left )  Patient Location: PACU  Anesthesia Type:General  Level of Consciousness: awake, alert , oriented and patient cooperative  Airway & Oxygen Therapy: Patient Spontanous Breathing and Patient connected to face mask oxygen  Post-op Assessment: Report given to RN, Post -op Vital signs reviewed and stable and Patient moving all extremities  Post vital signs: Reviewed and stable  Last Vitals:  Vitals Value Taken Time  BP 151/97 01/06/20 1010  Temp    Pulse 71 01/06/20 1011  Resp 12 01/06/20 1011  SpO2 100 % 01/06/20 1011  Vitals shown include unvalidated device data.  Last Pain:  Vitals:   01/06/20 0733  TempSrc: Oral  PainSc: 0-No pain         Complications: No apparent anesthesia complications

## 2020-01-08 LAB — SURGICAL PATHOLOGY

## 2020-01-13 ENCOUNTER — Ambulatory Visit (INDEPENDENT_AMBULATORY_CARE_PROVIDER_SITE_OTHER): Payer: Medicare Other | Admitting: *Deleted

## 2020-01-13 ENCOUNTER — Other Ambulatory Visit: Payer: Self-pay

## 2020-01-13 DIAGNOSIS — Z5181 Encounter for therapeutic drug level monitoring: Secondary | ICD-10-CM | POA: Diagnosis not present

## 2020-01-13 DIAGNOSIS — I4891 Unspecified atrial fibrillation: Secondary | ICD-10-CM | POA: Insufficient documentation

## 2020-01-13 DIAGNOSIS — I4819 Other persistent atrial fibrillation: Secondary | ICD-10-CM | POA: Insufficient documentation

## 2020-01-13 MED ORDER — WARFARIN SODIUM 5 MG PO TABS: 5.0000 mg | ORAL_TABLET | Freq: Every day | ORAL | 3 refills | Status: DC

## 2020-01-13 NOTE — Patient Instructions (Signed)
Here to change from Eliquis to warfarin due to cost.  Eliquis has been on hold since bladder Bx.  Per wife OK per Dr Jeffie Pollock to start coumadin. Take warfarin 1 tablet (5mg ) daily. Recheck in 1 week.

## 2020-01-19 ENCOUNTER — Ambulatory Visit (INDEPENDENT_AMBULATORY_CARE_PROVIDER_SITE_OTHER): Payer: Medicare Other | Admitting: *Deleted

## 2020-01-19 DIAGNOSIS — I4891 Unspecified atrial fibrillation: Secondary | ICD-10-CM | POA: Diagnosis not present

## 2020-01-19 DIAGNOSIS — Z5181 Encounter for therapeutic drug level monitoring: Secondary | ICD-10-CM | POA: Diagnosis not present

## 2020-01-19 LAB — POCT INR: INR: 3.4 — AB (ref 2.0–3.0)

## 2020-01-19 NOTE — Patient Instructions (Signed)
Hold warfarin tonight then decrease dose to 1 tablet daily except 1/2 tablet on Mondays and Thursdays Recheck in 10 days.

## 2020-01-21 DIAGNOSIS — C678 Malignant neoplasm of overlapping sites of bladder: Secondary | ICD-10-CM | POA: Diagnosis not present

## 2020-01-29 ENCOUNTER — Other Ambulatory Visit: Payer: Self-pay

## 2020-01-29 ENCOUNTER — Ambulatory Visit (INDEPENDENT_AMBULATORY_CARE_PROVIDER_SITE_OTHER): Payer: Medicare Other | Admitting: *Deleted

## 2020-01-29 DIAGNOSIS — Z5181 Encounter for therapeutic drug level monitoring: Secondary | ICD-10-CM

## 2020-01-29 DIAGNOSIS — I4891 Unspecified atrial fibrillation: Secondary | ICD-10-CM

## 2020-01-29 LAB — POCT INR: INR: 5 — AB (ref 2.0–3.0)

## 2020-01-29 NOTE — Patient Instructions (Signed)
Hold warfarin tonight and tomorrow night then decrease dose to 1/2 tablet daily except 1 tablet on Mondays and Thursdays Recheck in 1 week

## 2020-01-30 DIAGNOSIS — C678 Malignant neoplasm of overlapping sites of bladder: Secondary | ICD-10-CM | POA: Diagnosis not present

## 2020-01-30 DIAGNOSIS — Z5111 Encounter for antineoplastic chemotherapy: Secondary | ICD-10-CM | POA: Diagnosis not present

## 2020-02-05 ENCOUNTER — Other Ambulatory Visit: Payer: Self-pay

## 2020-02-05 ENCOUNTER — Ambulatory Visit (INDEPENDENT_AMBULATORY_CARE_PROVIDER_SITE_OTHER): Payer: Medicare Other | Admitting: *Deleted

## 2020-02-05 DIAGNOSIS — Z5181 Encounter for therapeutic drug level monitoring: Secondary | ICD-10-CM | POA: Diagnosis not present

## 2020-02-05 DIAGNOSIS — I4891 Unspecified atrial fibrillation: Secondary | ICD-10-CM

## 2020-02-05 LAB — POCT INR: INR: 1.6 — AB (ref 2.0–3.0)

## 2020-02-05 NOTE — Patient Instructions (Signed)
Increase warfarin to 1/2 tablet daily except 1 tablet on Tuesdays, Thursdays and Saturdays Recheck in 2 weeks

## 2020-02-06 DIAGNOSIS — Z5111 Encounter for antineoplastic chemotherapy: Secondary | ICD-10-CM | POA: Diagnosis not present

## 2020-02-06 DIAGNOSIS — C678 Malignant neoplasm of overlapping sites of bladder: Secondary | ICD-10-CM | POA: Diagnosis not present

## 2020-02-13 DIAGNOSIS — Z5111 Encounter for antineoplastic chemotherapy: Secondary | ICD-10-CM | POA: Diagnosis not present

## 2020-02-13 DIAGNOSIS — C678 Malignant neoplasm of overlapping sites of bladder: Secondary | ICD-10-CM | POA: Diagnosis not present

## 2020-02-19 ENCOUNTER — Ambulatory Visit (INDEPENDENT_AMBULATORY_CARE_PROVIDER_SITE_OTHER): Payer: Medicare Other | Admitting: *Deleted

## 2020-02-19 DIAGNOSIS — Z5181 Encounter for therapeutic drug level monitoring: Secondary | ICD-10-CM | POA: Diagnosis not present

## 2020-02-19 DIAGNOSIS — I4891 Unspecified atrial fibrillation: Secondary | ICD-10-CM

## 2020-02-19 LAB — POCT INR: INR: 2.2 (ref 2.0–3.0)

## 2020-02-19 NOTE — Patient Instructions (Signed)
Continue warfarin 1/2 tablet daily except 1 tablet on Tuesdays, Thursdays and Saturdays Recheck in 2 weeks

## 2020-02-20 DIAGNOSIS — C678 Malignant neoplasm of overlapping sites of bladder: Secondary | ICD-10-CM | POA: Diagnosis not present

## 2020-02-20 DIAGNOSIS — Z5111 Encounter for antineoplastic chemotherapy: Secondary | ICD-10-CM | POA: Diagnosis not present

## 2020-02-26 ENCOUNTER — Other Ambulatory Visit: Payer: Self-pay

## 2020-02-26 ENCOUNTER — Ambulatory Visit (INDEPENDENT_AMBULATORY_CARE_PROVIDER_SITE_OTHER): Payer: Medicare Other | Admitting: Cardiology

## 2020-02-26 ENCOUNTER — Encounter: Payer: Self-pay | Admitting: Cardiology

## 2020-02-26 VITALS — BP 126/66 | HR 61 | Ht 66.0 in | Wt 149.0 lb

## 2020-02-26 DIAGNOSIS — I2510 Atherosclerotic heart disease of native coronary artery without angina pectoris: Secondary | ICD-10-CM

## 2020-02-26 DIAGNOSIS — I10 Essential (primary) hypertension: Secondary | ICD-10-CM

## 2020-02-26 DIAGNOSIS — I4819 Other persistent atrial fibrillation: Secondary | ICD-10-CM | POA: Diagnosis not present

## 2020-02-26 DIAGNOSIS — E782 Mixed hyperlipidemia: Secondary | ICD-10-CM | POA: Diagnosis not present

## 2020-02-26 NOTE — Progress Notes (Signed)
Clinical Summary [name] is a 74 y.o.male seen today for follow up of the following medical problems.   1. CAD  - prior MI in 1997, prior CABG approx 13 years ago at [facility]  - cath 2011 showed patent grafts. 04/2013 echo shows LVEF 60%  - myoview 12/2012 shows old inferolateral scar, no ischemia    - no recent chest pain. No sob or DOE.    2. HTN  - he remains compliant with meds - lisionpril stopped by pcp per patient report. He did have some elevation of K after starting. -remains compliant with meds   3. HL  - labs followed by pcp - 05/2019 TC 123 HDL 69 TG 33 LDL 45 - compliant with statin  4COPD -followed by pcp   5. AAA screen -negative [place] 06/2016  6. Afib - new diagnosis during 11/2019 preop evaluation - occasional palpitations, just a few seconds. - he is already on metoprolol - had some hematuria about 1 month ago.  - some blood toilet paper with wiping, colonscopy 11/2017 hemorroids   - eliquis was too expensive, chagned to coumadin - no recent bleeding. No recent palpitations.         Aug 12 planning trip to [place].  Past Medical History:  Diagnosis Date  . Anginal pain ([place])   . Atrial fibrillation by electrocardiogram ([place]) 11/2019  . Atrial flutter by electrocardiogram ([place]) 11/2019  . Bladder tumor   . BPH (benign prostatic hyperplasia)   . CAD (coronary artery disease)    a. s/p INF MI 1997;  b. s/p CABG 2001;  c. [place] 11/2009:  3v CAD, S-PDA ok with 40% mid, S-OM ok, S-Dx ok, L-LAD ok, EF 50%;  d.  [place] 5/14:  Inferolateral scar, EF 46%, no ischemia  . COPD (chronic obstructive pulmonary disease) ([place])   . Dyspnea   . ETOH abuse   . GERD (gastroesophageal reflux disease)   . History of gout   . Hypercholesterolemia   . Hypertension   . Myocardial infarction ([place])   . Prostate cancer ([place])   . Tobacco abuse      No Known Allergies   Current Outpatient Medications  Medication Sig Dispense  Refill  . acetaminophen (TYLENOL) 500 MG tablet Take 1,000 mg by mouth every 6 (six) hours as needed for moderate pain or headache.    . albuterol (VENTOLIN HFA) 108 (90 Base) MCG/ACT inhaler INHALE TWO PUFFS INTO THE LUNGS EVERY 6 HOURS AS NEEDED FOR  WHEEZING  OR  SHORTNESS  OF  BREATH (Patient taking differently: Inhale 2 puffs into the lungs every 6 (six) hours as needed for wheezing or shortness of breath. ) 54 g 3  . allopurinol (ZYLOPRIM) 100 MG tablet Take 1 tablet (100 mg total) by mouth daily. 90 tablet 1  . amLODipine (NORVASC) 5 MG tablet Take 1 tablet (5 mg total) by mouth daily. (Patient taking differently: Take 5 mg by mouth every evening. ) 90 tablet 3  . atorvastatin (LIPITOR) 80 MG tablet Take 1 tablet (80 mg total) by mouth daily at 6 PM. 90 tablet 3  . Cholecalciferol (VITAMIN D3) 50 MCG (2000 UT) TABS Take by mouth.    . ferrous sulfate 325 (65 FE) MG tablet Take 325 mg by mouth daily with breakfast.    . fluticasone (FLONASE) 50 MCG/ACT nasal spray Place 2 sprays into both nostrils daily as needed for allergies. 60 g 5  . Fluticasone-Umeclidin-Vilant (TRELEGY ELLIPTA) 100-62.5-25 MCG/INH  AEPB Inhale 1 puff into the lungs every morning. 180 each 1  . metoprolol tartrate (LOPRESSOR) 25 MG tablet Take 0.5 tablets (12.5 mg total) by mouth 2 (two) times daily. 90 tablet 3  . nitroGLYCERIN (NITROSTAT) 0.4 MG SL tablet Place 1 tablet (0.4 mg total) under the tongue every 5 (five) minutes as needed for chest pain. 75 tablet 3  . omeprazole (PRILOSEC) 20 MG capsule Take 1 capsule (20 mg total) by mouth daily. 90 capsule 3  . tamsulosin (FLOMAX) 0.4 MG CAPS capsule Take 1 capsule (0.4 mg total) by mouth daily. 90 capsule 1  . warfarin (COUMADIN) 5 MG tablet Take 1 tablet (5 mg total) by mouth daily. 30 tablet 3   No current facility-administered medications for this visit.     Past Surgical History:  Procedure Laterality Date  . ANTERIOR CERVICAL DECOMPRESSION/DISCECTOMY FUSION 4  LEVELS N/A 07/21/2016   Procedure: ANTERIOR CERVICAL DECOMPRESSION/DISCECTOMY FUSION CERVICAL TWO-THREE, CERVICAL THREE-FOUR,. CERVICAL FOUR-FIVE, CERVICAL  FIVE-SIX;  Surgeon: Ashok Pall, MD;  Location: [facility];  Service: Neurosurgery;  Laterality: N/A;  . BACK SURGERY    . CARDIAC CATHETERIZATION  2011  . COLONOSCOPY     in remote past, Dr. Sharlett Iles. Obtaining records.   . COLONOSCOPY N/A 11/21/2017   Procedure: COLONOSCOPY;  Surgeon: Daneil Dolin, MD;  [facility];  Service: Endoscopy;  Laterality: N/A;  1:15  . CORONARY ANGIOPLASTY    . CORONARY ARTERY BYPASS GRAFT    . ESOPHAGOGASTRODUODENOSCOPY  05/27/2012   SHF:WYOVZC esophagus, stomach and duodenum s/p dilator  . POLYPECTOMY  11/21/2017   Procedure: POLYPECTOMY;  Surgeon: Daneil Dolin, MD;  Location: [facility];  Service: Endoscopy;;  colon  . PROSTATE SURGERY    . TRANSURETHRAL RESECTION OF BLADDER TUMOR WITH MITOMYCIN-C Left 01/06/2020   Procedure: CYSTOSCOPY TRANSURETHRAL RESECTION OF BLADDER TUMOR,LEFT URETERSCOPY AND GEMCITABINE;  Surgeon: Irine Seal, MD;  Location: [facility];  Service: Urology;  Laterality: Left;     No Known Allergies    Family History  Problem Relation Age of Onset  . Diabetes Mother   . Hypertension Father   . Heart disease Brother   . Diabetes Brother   . Heart disease Brother   . Diabetes Brother   . Multiple sclerosis Daughter   . Healthy Son   . Colon cancer Neg Hx      Social History [name] reports that he has been smoking cigarettes and e-cigarettes. He started smoking about 50 years ago. He has a 14.25 pack-year smoking history. He quit smokeless tobacco use about 7 years ago.  His smokeless tobacco use included snuff. [name] reports current alcohol use of about 6.0 standard drinks of alcohol per week.   Review of Systems CONSTITUTIONAL: No weight loss, fever, chills, weakness or fatigue.  HEENT: Eyes: No visual loss, blurred vision, double vision or yellow  sclerae.No hearing loss, sneezing, congestion, runny nose or sore throat.  SKIN: No rash or itching.  CARDIOVASCULAR: per hpi RESPIRATORY: No shortness of breath, cough or sputum.  GASTROINTESTINAL: No anorexia, nausea, vomiting or diarrhea. No abdominal pain or blood.  GENITOURINARY: No burning on urination, no polyuria NEUROLOGICAL: No headache, dizziness, syncope, paralysis, ataxia, numbness or tingling in the extremities. No change in bowel or bladder control.  MUSCULOSKELETAL: No muscle, back pain, joint pain or stiffness.  LYMPHATICS: No enlarged nodes. No history of splenectomy.  PSYCHIATRIC: No history of depression or anxiety.  ENDOCRINOLOGIC: No reports of sweating, cold or heat intolerance. No  polyuria or polydipsia.  Marland Kitchen   Physical Examination Today's Vitals   02/26/20 0845  BP: 126/66  Pulse: 61  SpO2: 100%  Weight: 149 lb (67.6 kg)  Height: 5\' 6"  (1.676 m)   Body mass index is 24.05 kg/m.  Gen: resting comfortably, no acute distress HEENT: no scleral icterus, pupils equal round and reactive, no palptable cervical adenopathy,  CV: irreg, no m/r/g, no jvd Resp: Clear to auscultation bilaterally GI: abdomen is soft, non-tender, non-distended, normal bowel sounds, no hepatosplenomegaly MSK: extremities are warm, no edema.  Skin: warm, no rash Neuro:  no focal deficits Psych: appropriate affect   Diagnostic Studies  11/25/2009:  Cardiac Cath Findings: Left ventricular angiogram was performed in the RAO projection and  showed normal left ventricular systolic function with ejection  fraction estimated at 50%. Mild mitral regurgitation was noted.  10.Aortic root angiogram was performed and did not show enlargement of  the aortic root.  IMPRESSION:  1. Severe triple-vessel coronary artery disease.  2. Patent bypass grafts 4/4.  3. Low normal left ventricular systolic function.   12/2012 Myoview  Inferolateral scar, no active ischemia, LVEF 46%.    05/01/13 Echo: LVEF 55-60%, grade II diastolic dysfunction, multiple WMAs, mild MR,   06/2016 AAA [place] No aneurysm   Assessment and Plan  1.CAD  - has not been on ACE-I due to prior elevatiion of potassium - no recent symptoms, continue current meds  2. HTN  -at goal, continue current meds  3. HL  -lipids at goal, continue statin  4. Afib - new diagnosis during 11/2019 preop - no significant symptoms, appers to be rate controlled on lopressor -eliquis too expensive, changed to coumadin - persistent afib by EKG today, discussed trial of cardioversion in setting of relatively new diagnosis of afib that is persistent when his INRs are therapeutic x 3 weeks, will notify coumadin clinic.          Arnoldo Lenis, M.D

## 2020-02-26 NOTE — Patient Instructions (Signed)
Medication Instructions:  Your physician recommends that you continue on your current medications as directed. Please refer to the Current Medication list given to you today.  *If you need a refill on your cardiac medications before your next appointment, please call your pharmacy*   Lab Work: NONE   If you have labs (blood work) drawn today and your tests are completely normal, you will receive your results only by: . MyChart Message (if you have MyChart) OR . A paper copy in the mail If you have any lab test that is abnormal or we need to change your treatment, we will call you to review the results.   Testing/Procedures: NONE    Follow-Up: At [facility], you and your health needs are our priority.  As part of our continuing mission to provide you with exceptional heart care, we have created designated Provider Care Teams.  These Care Teams include your primary Cardiologist (physician) and Advanced Practice Providers (APPs -  Physician Assistants and Nurse Practitioners) who all work together to provide you with the care you need, when you need it.  We recommend signing up for the patient portal called "MyChart".  Sign up information is provided on this After Visit Summary.  MyChart is used to connect with patients for Virtual Visits (Telemedicine).  Patients are able to view lab/test results, encounter notes, upcoming appointments, etc.  Non-urgent messages can be sent to your provider as well.   To learn more about what you can do with MyChart, go to [email or web address].    Your next appointment:   3 month(s)  The format for your next appointment:   In Person  Provider:   Jonathan Branch, MD   Other Instructions Thank you for choosing [facility]!    

## 2020-02-27 ENCOUNTER — Other Ambulatory Visit: Payer: Self-pay | Admitting: *Deleted

## 2020-02-27 DIAGNOSIS — Z5111 Encounter for antineoplastic chemotherapy: Secondary | ICD-10-CM | POA: Diagnosis not present

## 2020-02-27 DIAGNOSIS — Z8739 Personal history of other diseases of the musculoskeletal system and connective tissue: Secondary | ICD-10-CM

## 2020-02-27 DIAGNOSIS — I10 Essential (primary) hypertension: Secondary | ICD-10-CM

## 2020-02-27 DIAGNOSIS — C678 Malignant neoplasm of overlapping sites of bladder: Secondary | ICD-10-CM | POA: Diagnosis not present

## 2020-02-27 DIAGNOSIS — N400 Enlarged prostate without lower urinary tract symptoms: Secondary | ICD-10-CM

## 2020-02-27 MED ORDER — ALLOPURINOL 100 MG PO TABS: 100.0000 mg | ORAL_TABLET | Freq: Every day | ORAL | 0 refills | Status: DC

## 2020-02-27 MED ORDER — TAMSULOSIN HCL 0.4 MG PO CAPS: 0.4000 mg | ORAL_CAPSULE | Freq: Every day | ORAL | 0 refills | Status: DC

## 2020-02-27 MED ORDER — WARFARIN SODIUM 5 MG PO TABS: 5.0000 mg | ORAL_TABLET | Freq: Every day | ORAL | 0 refills | Status: DC

## 2020-02-27 NOTE — Addendum Note (Signed)
Addended by: Levonne Hubert on: 02/27/2020 11:35 AM   Modules accepted: Orders

## 2020-03-01 ENCOUNTER — Other Ambulatory Visit: Payer: Self-pay | Admitting: *Deleted

## 2020-03-01 DIAGNOSIS — J449 Chronic obstructive pulmonary disease, unspecified: Secondary | ICD-10-CM

## 2020-03-01 DIAGNOSIS — E785 Hyperlipidemia, unspecified: Secondary | ICD-10-CM

## 2020-03-01 DIAGNOSIS — I2510 Atherosclerotic heart disease of native coronary artery without angina pectoris: Secondary | ICD-10-CM

## 2020-03-01 MED ORDER — FLUTICASONE PROPIONATE 50 MCG/ACT NA SUSP: 2.0000 | Freq: Every day | NASAL | 2 refills | Status: DC | PRN

## 2020-03-01 MED ORDER — METOPROLOL TARTRATE 25 MG PO TABS: 12.5000 mg | ORAL_TABLET | Freq: Two times a day (BID) | ORAL | 2 refills | Status: DC

## 2020-03-01 MED ORDER — ATORVASTATIN CALCIUM 80 MG PO TABS: 80.0000 mg | ORAL_TABLET | Freq: Every day | ORAL | 2 refills | Status: DC

## 2020-03-01 MED ORDER — ACETAMINOPHEN 500 MG PO TABS: 1000.0000 mg | ORAL_TABLET | Freq: Four times a day (QID) | ORAL | 0 refills | Status: DC | PRN

## 2020-03-01 MED ORDER — OMEPRAZOLE 20 MG PO CPDR: 20.0000 mg | DELAYED_RELEASE_CAPSULE | Freq: Every day | ORAL | 2 refills | Status: DC

## 2020-03-01 MED ORDER — FERROUS SULFATE 325 (65 FE) MG PO TABS: 325.0000 mg | ORAL_TABLET | Freq: Every day | ORAL | 5 refills | Status: DC

## 2020-03-01 MED ORDER — NITROGLYCERIN 0.4 MG SL SUBL: 0.4000 mg | SUBLINGUAL_TABLET | SUBLINGUAL | 2 refills | Status: DC | PRN

## 2020-03-03 ENCOUNTER — Other Ambulatory Visit: Payer: Self-pay | Admitting: *Deleted

## 2020-03-03 DIAGNOSIS — N400 Enlarged prostate without lower urinary tract symptoms: Secondary | ICD-10-CM

## 2020-03-03 DIAGNOSIS — Z8739 Personal history of other diseases of the musculoskeletal system and connective tissue: Secondary | ICD-10-CM

## 2020-03-03 DIAGNOSIS — I10 Essential (primary) hypertension: Secondary | ICD-10-CM

## 2020-03-03 MED ORDER — AMLODIPINE BESYLATE 5 MG PO TABS: 5.0000 mg | ORAL_TABLET | Freq: Every day | ORAL | 2 refills | Status: DC

## 2020-03-03 MED ORDER — TAMSULOSIN HCL 0.4 MG PO CAPS: 0.4000 mg | ORAL_CAPSULE | Freq: Every day | ORAL | 0 refills | Status: DC

## 2020-03-03 MED ORDER — ALLOPURINOL 100 MG PO TABS: 100.0000 mg | ORAL_TABLET | Freq: Every day | ORAL | 0 refills | Status: DC

## 2020-03-03 MED ORDER — ACETAMINOPHEN 500 MG PO TABS: 1000.0000 mg | ORAL_TABLET | Freq: Four times a day (QID) | ORAL | 0 refills | Status: DC | PRN

## 2020-03-03 MED ORDER — WARFARIN SODIUM 5 MG PO TABS: 5.0000 mg | ORAL_TABLET | Freq: Every day | ORAL | 0 refills | Status: DC

## 2020-03-04 ENCOUNTER — Ambulatory Visit (INDEPENDENT_AMBULATORY_CARE_PROVIDER_SITE_OTHER): Payer: Medicare Other | Admitting: *Deleted

## 2020-03-04 DIAGNOSIS — Z5181 Encounter for therapeutic drug level monitoring: Secondary | ICD-10-CM

## 2020-03-04 DIAGNOSIS — I4891 Unspecified atrial fibrillation: Secondary | ICD-10-CM | POA: Diagnosis not present

## 2020-03-04 LAB — POCT INR: INR: 1.8 — AB (ref 2.0–3.0)

## 2020-03-04 NOTE — Patient Instructions (Signed)
Take warfarin 1 1/2 tablets tonight then increase dose to 1 tablet daily except 1/2 tablet on Mondays, Wednesdays and Fridays Recheck in 2 weeks

## 2020-03-05 DIAGNOSIS — Z5111 Encounter for antineoplastic chemotherapy: Secondary | ICD-10-CM | POA: Diagnosis not present

## 2020-03-05 DIAGNOSIS — C678 Malignant neoplasm of overlapping sites of bladder: Secondary | ICD-10-CM | POA: Diagnosis not present

## 2020-03-08 ENCOUNTER — Telehealth: Payer: Self-pay | Admitting: Family Medicine

## 2020-03-08 ENCOUNTER — Other Ambulatory Visit: Payer: Self-pay | Admitting: Family Medicine

## 2020-03-08 MED ORDER — TRELEGY ELLIPTA 100-62.5-25 MCG/INH IN AEPB: 1.0000 | INHALATION_SPRAY | RESPIRATORY_TRACT | 0 refills | Status: DC

## 2020-03-08 NOTE — Telephone Encounter (Signed)
Aware refill sent to pharmacy ?

## 2020-03-08 NOTE — Telephone Encounter (Signed)
  Prescription Request  03/08/2020  What is the name of the medication or equipment? Fluticasdone-Umeclidin-Vilant 100-62.5-25 MCG. Patient has switched pharmacies.  Have you contacted your pharmacy to request a refill? (if applicable) NO  Which pharmacy would you like this sent to? [facility] [place]  Patient notified that their request is being sent to the clinical staff for review and that they should receive a response within 2 business days.

## 2020-03-08 NOTE — Telephone Encounter (Signed)
Sent to [facility] mail order. 

## 2020-03-08 NOTE — Telephone Encounter (Signed)
°  Prescription Request  03/08/2020  What is the name of the medication or equipment? Fluticasone-Umeclidin-Vilant (TRELEGY ELLIPTA) 100-62.5-25 MCG/INH AEPB    Have you contacted your pharmacy to request a refill? (if applicable)yes, it was sent to [facility] and it is too high  Which pharmacy would you like this sent to? [facility] will be axing over request   Patient notified that their request is being sent to the clinical staff for review and that they should receive a response within 2 business days.

## 2020-03-17 ENCOUNTER — Other Ambulatory Visit: Payer: Self-pay

## 2020-03-17 ENCOUNTER — Ambulatory Visit (INDEPENDENT_AMBULATORY_CARE_PROVIDER_SITE_OTHER): Payer: Medicare HMO | Admitting: *Deleted

## 2020-03-17 DIAGNOSIS — I4891 Unspecified atrial fibrillation: Secondary | ICD-10-CM | POA: Diagnosis not present

## 2020-03-17 DIAGNOSIS — Z5181 Encounter for therapeutic drug level monitoring: Secondary | ICD-10-CM | POA: Diagnosis not present

## 2020-03-17 LAB — POCT INR: INR: 2.5 (ref 2.0–3.0)

## 2020-03-17 NOTE — Patient Instructions (Signed)
Possible DCCV  Leaving for [facility] 8/12 - 9-7  Will plan for DCCV after that Continue warfarin 1 tablet daily except 1/2 tablet on Mondays, Wednesdays and Fridays Recheck in 4 weeks then start weekly checks when pt gets back from [place]. Send INR results to Dr [name] after each check

## 2020-04-22 ENCOUNTER — Ambulatory Visit (INDEPENDENT_AMBULATORY_CARE_PROVIDER_SITE_OTHER): Payer: Medicare HMO | Admitting: *Deleted

## 2020-04-22 ENCOUNTER — Other Ambulatory Visit: Payer: Self-pay

## 2020-04-22 DIAGNOSIS — I4891 Unspecified atrial fibrillation: Secondary | ICD-10-CM | POA: Diagnosis not present

## 2020-04-22 DIAGNOSIS — Z5181 Encounter for therapeutic drug level monitoring: Secondary | ICD-10-CM

## 2020-04-22 LAB — POCT INR: INR: 2.5 (ref 2.0–3.0)

## 2020-04-22 NOTE — Patient Instructions (Signed)
Pre-DCCV (1st) Continue warfarin 1 tablet daily except 1/2 tablet on Mondays, Wednesdays and Fridays Recheck in 1 week Send INR results to Dr [name] after each check

## 2020-04-28 DIAGNOSIS — Z8551 Personal history of malignant neoplasm of bladder: Secondary | ICD-10-CM | POA: Diagnosis not present

## 2020-04-29 ENCOUNTER — Ambulatory Visit (INDEPENDENT_AMBULATORY_CARE_PROVIDER_SITE_OTHER): Payer: Medicare HMO | Admitting: *Deleted

## 2020-04-29 DIAGNOSIS — I4891 Unspecified atrial fibrillation: Secondary | ICD-10-CM | POA: Diagnosis not present

## 2020-04-29 DIAGNOSIS — Z5181 Encounter for therapeutic drug level monitoring: Secondary | ICD-10-CM

## 2020-04-29 LAB — POCT INR: INR: 1.9 — AB (ref 2.0–3.0)

## 2020-04-29 NOTE — Patient Instructions (Signed)
Pre-DCCV (2nd)  INR 1.9 Increase warfarin to 1 tablet daily except 1/2 tablet on Sundays and Wednesdays Recheck in 1 week Send INR results to Dr [name] after each check

## 2020-05-07 ENCOUNTER — Ambulatory Visit (INDEPENDENT_AMBULATORY_CARE_PROVIDER_SITE_OTHER): Payer: Medicare HMO | Admitting: *Deleted

## 2020-05-07 DIAGNOSIS — I4891 Unspecified atrial fibrillation: Secondary | ICD-10-CM

## 2020-05-07 DIAGNOSIS — Z5181 Encounter for therapeutic drug level monitoring: Secondary | ICD-10-CM | POA: Diagnosis not present

## 2020-05-07 LAB — POCT INR: INR: 2.7 (ref 2.0–3.0)

## 2020-05-07 NOTE — Patient Instructions (Signed)
Pre-DCCV (1st)  INR 2.7 Continue warfarin 1 tablet daily except 1/2 tablet on Sundays and Wednesdays Recheck in 1 week Send INR results to Dr [name] after each check

## 2020-05-13 ENCOUNTER — Ambulatory Visit (INDEPENDENT_AMBULATORY_CARE_PROVIDER_SITE_OTHER): Payer: Medicare HMO | Admitting: *Deleted

## 2020-05-13 DIAGNOSIS — Z5181 Encounter for therapeutic drug level monitoring: Secondary | ICD-10-CM | POA: Diagnosis not present

## 2020-05-13 DIAGNOSIS — I4891 Unspecified atrial fibrillation: Secondary | ICD-10-CM

## 2020-05-13 LAB — POCT INR: INR: 4.3 — AB (ref 2.0–3.0)

## 2020-05-13 NOTE — Patient Instructions (Signed)
Pre-DCCV (2nd)   Hold warfarin tonight then resume 1 tablet daily except 1/2 tablet on Sundays and Wednesdays Recheck in 1 week Send INR results to Dr [name] after each check

## 2020-05-20 ENCOUNTER — Other Ambulatory Visit: Payer: Self-pay

## 2020-05-20 ENCOUNTER — Ambulatory Visit (INDEPENDENT_AMBULATORY_CARE_PROVIDER_SITE_OTHER): Payer: Medicare HMO | Admitting: *Deleted

## 2020-05-20 DIAGNOSIS — Z5181 Encounter for therapeutic drug level monitoring: Secondary | ICD-10-CM | POA: Diagnosis not present

## 2020-05-20 DIAGNOSIS — I4891 Unspecified atrial fibrillation: Secondary | ICD-10-CM | POA: Diagnosis not present

## 2020-05-20 LAB — POCT INR: INR: 3.5 — AB (ref 2.0–3.0)

## 2020-05-20 NOTE — Patient Instructions (Signed)
Pre-DCCV (3rd)   Take warfarin 1/2 tablet tonight then resume 1 tablet daily except 1/2 tablet on Sundays and Wednesdays Recheck in 1 week Sent INR results to Dr [name] to schedule DCCV per his request

## 2020-05-21 DIAGNOSIS — Z5111 Encounter for antineoplastic chemotherapy: Secondary | ICD-10-CM | POA: Diagnosis not present

## 2020-05-21 DIAGNOSIS — C678 Malignant neoplasm of overlapping sites of bladder: Secondary | ICD-10-CM | POA: Diagnosis not present

## 2020-05-26 ENCOUNTER — Ambulatory Visit (INDEPENDENT_AMBULATORY_CARE_PROVIDER_SITE_OTHER): Payer: Medicare HMO | Admitting: Family Medicine

## 2020-05-26 ENCOUNTER — Other Ambulatory Visit: Payer: Self-pay

## 2020-05-26 ENCOUNTER — Other Ambulatory Visit: Payer: Self-pay | Admitting: Family Medicine

## 2020-05-26 ENCOUNTER — Encounter: Payer: Self-pay | Admitting: Family Medicine

## 2020-05-26 VITALS — BP 122/76 | HR 74 | Temp 97.7°F | Resp 18 | Ht 66.0 in | Wt 150.6 lb

## 2020-05-26 DIAGNOSIS — E875 Hyperkalemia: Secondary | ICD-10-CM | POA: Diagnosis not present

## 2020-05-26 DIAGNOSIS — K219 Gastro-esophageal reflux disease without esophagitis: Secondary | ICD-10-CM

## 2020-05-26 DIAGNOSIS — M109 Gout, unspecified: Secondary | ICD-10-CM

## 2020-05-26 DIAGNOSIS — E785 Hyperlipidemia, unspecified: Secondary | ICD-10-CM | POA: Diagnosis not present

## 2020-05-26 DIAGNOSIS — F1010 Alcohol abuse, uncomplicated: Secondary | ICD-10-CM | POA: Diagnosis not present

## 2020-05-26 DIAGNOSIS — Z23 Encounter for immunization: Secondary | ICD-10-CM

## 2020-05-26 DIAGNOSIS — J449 Chronic obstructive pulmonary disease, unspecified: Secondary | ICD-10-CM | POA: Diagnosis not present

## 2020-05-26 DIAGNOSIS — Z8739 Personal history of other diseases of the musculoskeletal system and connective tissue: Secondary | ICD-10-CM

## 2020-05-26 DIAGNOSIS — F17200 Nicotine dependence, unspecified, uncomplicated: Secondary | ICD-10-CM | POA: Diagnosis not present

## 2020-05-26 DIAGNOSIS — Z125 Encounter for screening for malignant neoplasm of prostate: Secondary | ICD-10-CM

## 2020-05-26 DIAGNOSIS — C44519 Basal cell carcinoma of skin of other part of trunk: Secondary | ICD-10-CM

## 2020-05-26 DIAGNOSIS — I10 Essential (primary) hypertension: Secondary | ICD-10-CM

## 2020-05-26 MED ORDER — CHANTIX STARTING MONTH PAK 0.5 MG X 11 & 1 MG X 42 PO TABS: ORAL_TABLET | ORAL | 0 refills | Status: DC

## 2020-05-26 MED ORDER — VARENICLINE TARTRATE 1 MG PO TABS: 1.0000 mg | ORAL_TABLET | Freq: Two times a day (BID) | ORAL | 5 refills | Status: DC

## 2020-05-26 NOTE — Progress Notes (Signed)
Subjective:  [name], male    DOB: May 06, 1946  Age: 74 y.o. MRN: [id number]  CC: Follow-up   HPI [name] presents for recheck of his chronic COPD.  He says he is getting a bit more dyspneic.  His wife agrees.  Patient says he has been smoking since he was 74 years old which is 79 years.  Most of that time he smoked 1 pack/day although he is now down to 1/2 pack a day.  He is willing to try Chantix.  He has a lesion on the upper left chest that he like to have checked today as well.  It had been kind small and harmless looking but appears to have grown recently he tells me.    Follow-up of hypertension. Patient has no history of headache chest pain or shortness of breath or recent cough. Patient also denies symptoms of TIA such as numbness weakness lateralizing. Patient checks  blood pressure at home and has not had any elevated readings recently. Patient denies side effects from his medication. States taking it regularly.  Patient in for follow-up of elevated cholesterol. Doing well without complaints on current medication. Denies side effects of statin including myalgia and arthralgia and nausea. Also in today for liver function testing. Currently no chest pain, shortness of breath or other cardiovascular related symptoms noted.  Patient in for follow-up of GERD. Currently asymptomatic taking  PPI daily. There is no chest pain or heartburn. No hematemesis and no melena. No dysphagia or choking. Onset is remote. Progression is stable. Complicating factors, none.   He currently denies chest pain although he does have a history of coronary disease for which she is followed by cardiology routinely.  Depression screen [facility] 2/9 05/26/2020 11/26/2019 11/25/2019  Decreased Interest 0 0 0  Down, Depressed, Hopeless 0 0 0  PHQ - 2 Score 0 0 0    History [name] has a past medical history of Anginal pain ([place]), Atrial fibrillation by electrocardiogram ([place]) (11/2019), Atrial  flutter by electrocardiogram ([place]) (11/2019), Bladder tumor, BPH (benign prostatic hyperplasia), CAD (coronary artery disease), COPD (chronic obstructive pulmonary disease) ([place]), Dyspnea, ETOH abuse, GERD (gastroesophageal reflux disease), History of gout, Hypercholesterolemia, Hypertension, Myocardial infarction [facility]), Prostate cancer ([place]), and Tobacco abuse.   He has a past surgical history that includes Prostate surgery; Colonoscopy; Back surgery; Esophagogastroduodenoscopy (05/27/2012); Cardiac catheterization (2011); Coronary angioplasty; Coronary artery bypass graft; Anterior cervical decompression/discectomy fusion 4 level (N/A, 07/21/2016); Colonoscopy (N/A, 11/21/2017); polypectomy (11/21/2017); and Transurethral resection of bladder tumor with mitomycin-c (Left, 01/06/2020).   His family history includes Diabetes in his brother, brother, and mother; Healthy in his son; Heart disease in his brother and brother; Hypertension in his father; Multiple sclerosis in his daughter.He reports that he has been smoking cigarettes and e-cigarettes. He started smoking about 50 years ago. He has a 28.50 pack-year smoking history. He quit smokeless tobacco use about 8 years ago.  His smokeless tobacco use included snuff. He reports current alcohol use of about 6.0 standard drinks of alcohol per week. He reports that he does not use drugs.    ROS Review of Systems  Constitutional: Negative.   HENT: Negative.   Eyes: Negative for visual disturbance.  Respiratory: Positive for cough and shortness of breath.   Cardiovascular: Negative for chest pain and leg swelling.  Gastrointestinal: Negative for abdominal pain, diarrhea, nausea and vomiting.  Genitourinary: Negative for difficulty urinating.  Musculoskeletal: Negative for arthralgias and myalgias.  Skin: Negative for rash.  Neurological: Negative for headaches.  Psychiatric/Behavioral: Negative.  Negative for sleep disturbance.    Objective:  BP  122/76   Pulse 74   Temp 97.7 F (36.5 C) (Temporal)   Resp 18   Ht 5' 6"  (1.676 m)   Wt 150 lb 9.6 oz (68.3 kg)   SpO2 100%   BMI 24.31 kg/m   BP Readings from Last 3 Encounters:  05/26/20 122/76  02/26/20 126/66  01/06/20 (!) 151/88    Wt Readings from Last 3 Encounters:  05/26/20 150 lb 9.6 oz (68.3 kg)  02/26/20 149 lb (67.6 kg)  01/06/20 149 lb 7 oz (67.8 kg)     Physical Exam Constitutional:      General: He is not in acute distress.    Appearance: He is well-developed.  HENT:     Head: Normocephalic and atraumatic.     Right Ear: External ear normal.     Left Ear: External ear normal.     Nose: Nose normal.  Eyes:     Conjunctiva/sclera: Conjunctivae normal.     Pupils: Pupils are equal, round, and reactive to light.  Cardiovascular:     Rate and Rhythm: Normal rate and regular rhythm.     Heart sounds: Normal heart sounds. No murmur heard.   Pulmonary:     Effort: Pulmonary effort is normal. No respiratory distress.     Breath sounds: Wheezing (With overall diminished breath sounds.) present. No rales.  Abdominal:     Palpations: Abdomen is soft.     Tenderness: There is no abdominal tenderness.  Musculoskeletal:        General: Normal range of motion.     Cervical back: Normal range of motion and neck supple.  Skin:    General: Skin is warm and dry.  Neurological:     Mental Status: He is alert and oriented to person, place, and time.     Deep Tendon Reflexes: Reflexes are normal and symmetric.  Psychiatric:        Behavior: Behavior normal.        Thought Content: Thought content normal.        Judgment: Judgment normal.       Assessment & Plan:   [name] was seen today for follow-up.  Diagnoses and all orders for this visit:  Chronic obstructive pulmonary disease, unspecified COPD type ([place]) -     CBC with Differential/Platelet -     CMP14+EGFR -     CT CHEST LUNG CANCER SCREENING LOW DOSE WO CONTRAST; Future  Need for immunization  against influenza -     Flu Vaccine QUAD High Dose(Fluad) -     CBC with Differential/Platelet -     CMP14+EGFR  Hyperlipidemia, unspecified hyperlipidemia type -     CBC with Differential/Platelet -     CMP14+EGFR -     Lipid panel  TOBACCO USER -     CBC with Differential/Platelet -     CMP14+EGFR -     CT CHEST LUNG CANCER SCREENING LOW DOSE WO CONTRAST; Future  Alcohol abuse -     CBC with Differential/Platelet -     CMP14+EGFR  Gastroesophageal reflux disease without esophagitis -     Ambulatory referral to Dermatology -     CBC with Differential/Platelet -     CMP14+EGFR  Acute gout of left knee, unspecified cause -     CBC with Differential/Platelet -     CMP14+EGFR  Hyperkalemia -     CBC with  Differential/Platelet -     CMP14+EGFR  Essential hypertension -     CBC with Differential/Platelet -     CMP14+EGFR  Basal cell carcinoma (BCC) of skin of other part of torso -     Ambulatory referral to Dermatology -     CBC with Differential/Platelet -     CMP14+EGFR  Screening for prostate cancer -     PSA Total (Reflex To Free)  Other orders -     Tdap vaccine greater than or equal to 7yo IM -     varenicline (CHANTIX CONTINUING MONTH PAK) 1 MG tablet; Take 1 tablet (1 mg total) by mouth 2 (two) times daily. -     varenicline (CHANTIX STARTING MONTH PAK) 0.5 MG X 11 & 1 MG X 42 tablet; Use according to package directions       I am having [name] start on varenicline and Chantix Starting Month Pak. I am also having him maintain his albuterol, Vitamin D3, atorvastatin, ferrous sulfate, fluticasone, metoprolol tartrate, nitroGLYCERIN, omeprazole, acetaminophen, allopurinol, amLODipine, tamsulosin, warfarin, and Trelegy Ellipta.  Allergies as of 05/26/2020   No Known Allergies     Medication List       Accurate as of May 26, 2020  6:12 PM. If you have any questions, ask your nurse or doctor.        acetaminophen 500 MG tablet Commonly  known as: TYLENOL Take 2 tablets (1,000 mg total) by mouth every 6 (six) hours as needed for moderate pain or headache.   albuterol 108 (90 Base) MCG/ACT inhaler Commonly known as: Ventolin HFA INHALE TWO PUFFS INTO THE LUNGS EVERY 6 HOURS AS NEEDED FOR  WHEEZING  OR  SHORTNESS  OF  BREATH What changed:   how much to take  how to take this  when to take this  reasons to take this  additional instructions   allopurinol 100 MG tablet Commonly known as: ZYLOPRIM Take 1 tablet (100 mg total) by mouth daily.   amLODipine 5 MG tablet Commonly known as: NORVASC Take 1 tablet (5 mg total) by mouth daily.   atorvastatin 80 MG tablet Commonly known as: LIPITOR Take 1 tablet (80 mg total) by mouth daily at 6 PM.   ferrous sulfate 325 (65 FE) MG tablet Take 1 tablet (325 mg total) by mouth daily with breakfast.   fluticasone 50 MCG/ACT nasal spray Commonly known as: FLONASE Place 2 sprays into both nostrils daily as needed for allergies.   metoprolol tartrate 25 MG tablet Commonly known as: LOPRESSOR Take 0.5 tablets (12.5 mg total) by mouth 2 (two) times daily.   nitroGLYCERIN 0.4 MG SL tablet Commonly known as: NITROSTAT Place 1 tablet (0.4 mg total) under the tongue every 5 (five) minutes as needed for chest pain.   omeprazole 20 MG capsule Commonly known as: PRILOSEC Take 1 capsule (20 mg total) by mouth daily.   tamsulosin 0.4 MG Caps capsule Commonly known as: FLOMAX Take 1 capsule (0.4 mg total) by mouth daily.   Trelegy Ellipta 100-62.5-25 MCG/INH Aepb Generic drug: Fluticasone-Umeclidin-Vilant Inhale 1 puff into the lungs every morning.   varenicline 1 MG tablet Commonly known as: Chantix Continuing Month Pak Take 1 tablet (1 mg total) by mouth 2 (two) times daily. Started by: [name], MD   Chantix Starting Month Pak 0.5 MG X 11 & 1 MG X 42 tablet Generic drug: varenicline Use according to package directions Started by: [name], MD   Vitamin  D3 50  MCG (2000 UT) Tabs Take by mouth.   warfarin 5 MG tablet Commonly known as: COUMADIN Take as directed by the anticoagulation clinic. If you are unsure how to take this medication, talk to your nurse or doctor. Original instructions: Take 1 tablet (5 mg total) by mouth daily.        Follow-up: Return in about 3 months (around 08/26/2020) for COPD.  [name], M.D.

## 2020-05-27 ENCOUNTER — Ambulatory Visit (INDEPENDENT_AMBULATORY_CARE_PROVIDER_SITE_OTHER): Payer: Medicare HMO | Admitting: Pharmacist

## 2020-05-27 ENCOUNTER — Encounter (HOSPITAL_COMMUNITY): Payer: Self-pay

## 2020-05-27 DIAGNOSIS — I4891 Unspecified atrial fibrillation: Secondary | ICD-10-CM | POA: Diagnosis not present

## 2020-05-27 DIAGNOSIS — Z5181 Encounter for therapeutic drug level monitoring: Secondary | ICD-10-CM | POA: Diagnosis not present

## 2020-05-27 LAB — CBC WITH DIFFERENTIAL/PLATELET
Basophils Absolute: 0 10*3/uL (ref 0.0–0.2)
Basos: 1 %
EOS (ABSOLUTE): 0.1 10*3/uL (ref 0.0–0.4)
Eos: 2 %
Hematocrit: 41.3 % (ref 37.5–51.0)
Hemoglobin: 14 g/dL (ref 13.0–17.7)
Immature Grans (Abs): 0 10*3/uL (ref 0.0–0.1)
Immature Granulocytes: 0 %
Lymphocytes Absolute: 2.3 10*3/uL (ref 0.7–3.1)
Lymphs: 44 %
MCH: 33.6 pg — ABNORMAL HIGH (ref 26.6–33.0)
MCHC: 33.9 g/dL (ref 31.5–35.7)
MCV: 99 fL — ABNORMAL HIGH (ref 79–97)
Monocytes Absolute: 0.4 10*3/uL (ref 0.1–0.9)
Monocytes: 7 %
Neutrophils Absolute: 2.4 10*3/uL (ref 1.4–7.0)
Neutrophils: 46 %
Platelets: 204 10*3/uL (ref 150–450)
RBC: 4.17 x10E6/uL (ref 4.14–5.80)
RDW: 14.6 % (ref 11.6–15.4)
WBC: 5.2 10*3/uL (ref 3.4–10.8)

## 2020-05-27 LAB — CMP14+EGFR
ALT: 15 IU/L (ref 0–44)
AST: 21 IU/L (ref 0–40)
Albumin/Globulin Ratio: 1.5 (ref 1.2–2.2)
Albumin: 4.1 g/dL (ref 3.7–4.7)
Alkaline Phosphatase: 120 IU/L (ref 44–121)
BUN/Creatinine Ratio: 10 (ref 10–24)
BUN: 9 mg/dL (ref 8–27)
Bilirubin Total: 0.4 mg/dL (ref 0.0–1.2)
CO2: 25 mmol/L (ref 20–29)
Calcium: 9.8 mg/dL (ref 8.6–10.2)
Chloride: 103 mmol/L (ref 96–106)
Creatinine, Ser: 0.91 mg/dL (ref 0.76–1.27)
GFR calc Af Amer: 97 mL/min/{1.73_m2} (ref >59)
GFR calc non Af Amer: 84 mL/min/{1.73_m2} (ref >59)
Globulin, Total: 2.7 g/dL (ref 1.5–4.5)
Glucose: 110 mg/dL — ABNORMAL HIGH (ref 65–99)
Potassium: 4.4 mmol/L (ref 3.5–5.2)
Sodium: 139 mmol/L (ref 134–144)
Total Protein: 6.8 g/dL (ref 6.0–8.5)

## 2020-05-27 LAB — LIPID PANEL
Chol/HDL Ratio: 2.7 ratio (ref 0.0–5.0)
Cholesterol, Total: 131 mg/dL (ref 100–199)
HDL: 49 mg/dL (ref >39)
LDL Chol Calc (NIH): 68 mg/dL (ref 0–99)
Triglycerides: 65 mg/dL (ref 0–149)
VLDL Cholesterol Cal: 14 mg/dL (ref 5–40)

## 2020-05-27 LAB — PSA TOTAL (REFLEX TO FREE): Prostate Specific Ag, Serum: <0.1 ng/mL (ref 0.0–4.0)

## 2020-05-27 LAB — POCT INR: INR: 3.3 — AB (ref 2.0–3.0)

## 2020-05-27 NOTE — Progress Notes (Signed)
Hello Jessie,  Your lab result is normal and/or stable.Some minor variations that are not significant are commonly marked abnormal, but do not represent any medical problem for you.  Best regards, Christy Friede, M.D.

## 2020-05-27 NOTE — Patient Instructions (Signed)
Description   Pre-DCCV (3rd)   Take warfarin 1/2 tablet tonight and tomorrow then resume 1 tablet daily except 1/2 tablet on Sundays and Wednesdays Recheck in 1 week Send INR results to Dr [name]

## 2020-06-01 ENCOUNTER — Ambulatory Visit: Payer: Medicare HMO | Admitting: Cardiology

## 2020-06-01 ENCOUNTER — Other Ambulatory Visit: Payer: Self-pay

## 2020-06-01 ENCOUNTER — Encounter: Payer: Self-pay | Admitting: Cardiology

## 2020-06-01 VITALS — BP 124/78 | HR 86 | Ht 66.0 in | Wt 149.8 lb

## 2020-06-01 DIAGNOSIS — I4891 Unspecified atrial fibrillation: Secondary | ICD-10-CM

## 2020-06-01 DIAGNOSIS — I2510 Atherosclerotic heart disease of native coronary artery without angina pectoris: Secondary | ICD-10-CM

## 2020-06-01 DIAGNOSIS — I10 Essential (primary) hypertension: Secondary | ICD-10-CM

## 2020-06-01 DIAGNOSIS — E782 Mixed hyperlipidemia: Secondary | ICD-10-CM

## 2020-06-01 NOTE — Progress Notes (Signed)
Clinical Summary [name] is a 74 y.o.male  seen today for follow up of the following medical problems.  1. CAD  - prior MI in 1997, prior CABG approx 13 years ago at [facility]  - cath 2011 showed patent grafts. 04/2013 echo shows LVEF 60%  - myoview 12/2012 shows old inferolateral scar, no ischemia    - no recent chest pain. No SOB or DOE - compliant with meds   2. HTN  - he remains compliant with meds - lisionpril stopped by pcp per patient report. He did have some elevation of K after starting. - compliant with meds.    3. HL  - 05/2020 TC 131 TG 65 HDL 49 LDL 68 - compliant with statin.   4COPD -followed by pcp   5. AAA screen -negative [place] 06/2016  6. Afib - new diagnosis during 11/2019 preop evaluation -occasional palpitations, just a few seconds. - he is already on metoprolol - had some hematuria about 1 month ago.  - some blood toilet paper with wiping, colonscopy 11/2017 hemorroids   - eliquis was too expensive, chagned to coumadin - occasional palpitations at night, few times at night. Lasts about 10 minutes - we had discussed a cardioversion last visit, coumadin levels were labile and we had not pursued. From chart review has been therapeutic the last 3 weeks      Past Medical History:  Diagnosis Date  . Anginal pain ([place])   . Atrial fibrillation by electrocardiogram ([place]) 11/2019  . Atrial flutter by electrocardiogram ([place]) 11/2019  . Bladder tumor   . BPH (benign prostatic hyperplasia)   . CAD (coronary artery disease)    a. s/p INF MI 1997;  b. s/p CABG 2001;  c. [place] 11/2009:  3v CAD, S-PDA ok with 40% mid, S-OM ok, S-Dx ok, L-LAD ok, EF 50%;  d.  [place] 5/14:  Inferolateral scar, EF 46%, no ischemia  . COPD (chronic obstructive pulmonary disease) ([place])   . Dyspnea   . ETOH abuse   . GERD (gastroesophageal reflux disease)   . History of gout   . Hypercholesterolemia   . Hypertension   . Myocardial infarction  ([place])   . Prostate cancer ([place])   . Tobacco abuse      No Known Allergies   Current Outpatient Medications  Medication Sig Dispense Refill  . acetaminophen (TYLENOL) 500 MG tablet Take 2 tablets (1,000 mg total) by mouth every 6 (six) hours as needed for moderate pain or headache. 90 tablet 0  . albuterol (VENTOLIN HFA) 108 (90 Base) MCG/ACT inhaler INHALE TWO PUFFS INTO THE LUNGS EVERY 6 HOURS AS NEEDED FOR  WHEEZING  OR  SHORTNESS  OF  BREATH (Patient taking differently: Inhale 2 puffs into the lungs every 6 (six) hours as needed for wheezing or shortness of breath. ) 54 g 3  . allopurinol (ZYLOPRIM) 100 MG tablet TAKE 1 TABLET EVERY DAY 90 tablet 1  . amLODipine (NORVASC) 5 MG tablet Take 1 tablet (5 mg total) by mouth daily. 90 tablet 2  . atorvastatin (LIPITOR) 80 MG tablet Take 1 tablet (80 mg total) by mouth daily at 6 PM. 90 tablet 2  . Cholecalciferol (VITAMIN D3) 50 MCG (2000 UT) TABS Take by mouth.    . ferrous sulfate 325 (65 FE) MG tablet Take 1 tablet (325 mg total) by mouth daily with breakfast. 30 tablet 5  . fluticasone (FLONASE) 50 MCG/ACT nasal spray Place 2 sprays into both nostrils  daily as needed for allergies. 48 g 2  . Fluticasone-Umeclidin-Vilant (TRELEGY ELLIPTA) 100-62.5-25 MCG/INH AEPB Inhale 1 puff into the lungs every morning. 180 each 0  . metoprolol tartrate (LOPRESSOR) 25 MG tablet Take 0.5 tablets (12.5 mg total) by mouth 2 (two) times daily. 90 tablet 2  . nitroGLYCERIN (NITROSTAT) 0.4 MG SL tablet Place 1 tablet (0.4 mg total) under the tongue every 5 (five) minutes as needed for chest pain. 75 tablet 2  . omeprazole (PRILOSEC) 20 MG capsule Take 1 capsule (20 mg total) by mouth daily. 90 capsule 2  . tamsulosin (FLOMAX) 0.4 MG CAPS capsule Take 1 capsule (0.4 mg total) by mouth daily. 90 capsule 0  . varenicline (CHANTIX CONTINUING MONTH PAK) 1 MG tablet Take 1 tablet (1 mg total) by mouth 2 (two) times daily. 60 tablet 5  . varenicline (CHANTIX STARTING  MONTH PAK) 0.5 MG X 11 & 1 MG X 42 tablet Use according to package directions 53 tablet 0  . warfarin (COUMADIN) 5 MG tablet TAKE 1 TABLET EVERY DAY 90 tablet 0   No current facility-administered medications for this visit.     Past Surgical History:  Procedure Laterality Date  . ANTERIOR CERVICAL DECOMPRESSION/DISCECTOMY FUSION 4 LEVELS N/A 07/21/2016   Procedure: ANTERIOR CERVICAL DECOMPRESSION/DISCECTOMY FUSION CERVICAL TWO-THREE, CERVICAL THREE-FOUR,. CERVICAL FOUR-FIVE, CERVICAL  FIVE-SIX;  Surgeon: Ashok Pall, MD;  Location: [facility];  Service: Neurosurgery;  Laterality: N/A;  . BACK SURGERY    . CARDIAC CATHETERIZATION  2011  . COLONOSCOPY     in remote past, Dr. Sharlett Iles. Obtaining records.   . COLONOSCOPY N/A 11/21/2017   Procedure: COLONOSCOPY;  Surgeon: Daneil Dolin, MD;  [facility];  Service: Endoscopy;  Laterality: N/A;  1:15  . CORONARY ANGIOPLASTY    . CORONARY ARTERY BYPASS GRAFT    . ESOPHAGOGASTRODUODENOSCOPY  05/27/2012   HUD:JSHFWY esophagus, stomach and duodenum s/p dilator  . POLYPECTOMY  11/21/2017   Procedure: POLYPECTOMY;  Surgeon: Daneil Dolin, MD;  Location: [facility];  Service: Endoscopy;;  colon  . PROSTATE SURGERY    . TRANSURETHRAL RESECTION OF BLADDER TUMOR WITH MITOMYCIN-C Left 01/06/2020   Procedure: CYSTOSCOPY TRANSURETHRAL RESECTION OF BLADDER TUMOR,LEFT URETERSCOPY AND GEMCITABINE;  Surgeon: Irine Seal, MD;  Location: [facility];  Service: Urology;  Laterality: Left;     No Known Allergies    Family History  Problem Relation Age of Onset  . Diabetes Mother   . Hypertension Father   . Heart disease Brother   . Diabetes Brother   . Heart disease Brother   . Diabetes Brother   . Multiple sclerosis Daughter   . Healthy Son   . Colon cancer Neg Hx      Social History [name] reports that he has been smoking cigarettes and e-cigarettes. He started smoking about 50 years ago. He has a 28.50 pack-year smoking history. He  quit smokeless tobacco use about 8 years ago.  His smokeless tobacco use included snuff. [name] reports current alcohol use of about 6.0 standard drinks of alcohol per week.   Review of Systems CONSTITUTIONAL: No weight loss, fever, chills, weakness or fatigue.  HEENT: Eyes: No visual loss, blurred vision, double vision or yellow sclerae.No hearing loss, sneezing, congestion, runny nose or sore throat.  SKIN: No rash or itching.  CARDIOVASCULAR: per hpi RESPIRATORY: No shortness of breath, cough or sputum.  GASTROINTESTINAL: No anorexia, nausea, vomiting or diarrhea. No abdominal pain or blood.  GENITOURINARY: No burning on  urination, no polyuria NEUROLOGICAL: No headache, dizziness, syncope, paralysis, ataxia, numbness or tingling in the extremities. No change in bowel or bladder control.  MUSCULOSKELETAL: No muscle, back pain, joint pain or stiffness.  LYMPHATICS: No enlarged nodes. No history of splenectomy.  PSYCHIATRIC: No history of depression or anxiety.  ENDOCRINOLOGIC: No reports of sweating, cold or heat intolerance. No polyuria or polydipsia.  Marland Kitchen   Physical Examination There were no vitals filed for this visit. Filed Weights   06/01/20 1309  Weight: 149 lb 12.8 oz (67.9 kg)    Gen: resting comfortably, no acute distress HEENT: no scleral icterus, pupils equal round and reactive, no palptable cervical adenopathy,  CV: RRR, no m/r/g, no jvd Resp: Clear to auscultation bilaterally GI: abdomen is soft, non-tender, non-distended, normal bowel sounds, no hepatosplenomegaly MSK: extremities are warm, no edema.  Skin: warm, no rash Neuro:  no focal deficits Psych: appropriate affect   Diagnostic Studies  11/25/2009:  Cardiac Cath Findings: Left ventricular angiogram was performed in the RAO projection and  showed normal left ventricular systolic function with ejection  fraction estimated at 50%. Mild mitral regurgitation was noted.  10.Aortic root angiogram was  performed and did not show enlargement of  the aortic root.  IMPRESSION:  1. Severe triple-vessel coronary artery disease.  2. Patent bypass grafts 4/4.  3. Low normal left ventricular systolic function.   12/2012 Myoview  Inferolateral scar, no active ischemia, LVEF 46%.   05/01/13 Echo: LVEF 55-60%, grade II diastolic dysfunction, multiple WMAs, mild MR,   06/2016 AAA [place] No aneurysm    Assessment and Plan  1.CAD  - has not been on ACE-I due to prior elevatiion of potassium - no symptoms, continue to monitor.   2. HTN  -he is at goal, continue current meds  3. HL  -at goal, continue curren tmeds  4. Afib - new diagnosis during 11/2019 preop -eliquis too expensive, changed to coumadin  - INRs have been therapeutic, we discussed again cardioversion - EKG today shows he remains in afib, we will arrange an electrical cardioversion       [name], M.D.

## 2020-06-01 NOTE — Patient Instructions (Signed)
Medication Instructions:  Your physician recommends that you continue on your current medications as directed. Please refer to the Current Medication list given to you today.  *If you need a refill on your cardiac medications before your next appointment, please call your pharmacy*   Lab Work: Get labs done the day of pre-op apt  If you have labs (blood work) drawn today and your tests are completely normal, you will receive your results only by: Marland Kitchen MyChart Message (if you have MyChart) OR . A paper copy in the mail If you have any lab test that is abnormal or we need to change your treatment, we will call you to review the results.   Testing/Procedures: Your physician has recommended that you have a Cardioversion (DCCV). Electrical Cardioversion uses a jolt of electricity to your heart either through paddles or wired patches attached to your chest. This is a controlled, usually prescheduled, procedure. Defibrillation is done under light anesthesia in the hospital, and you usually go home the day of the procedure. This is done to get your heart back into a normal rhythm. You are not awake for the procedure. Please see the instruction sheet given to you today.     Follow-Up: At [facility], you and your health needs are our priority.  As part of our continuing mission to provide you with exceptional heart care, we have created designated Provider Care Teams.  These Care Teams include your primary Cardiologist (physician) and Advanced Practice Providers (APPs -  Physician Assistants and Nurse Practitioners) who all work together to provide you with the care you need, when you need it.  We recommend signing up for the patient portal called "MyChart".  Sign up information is provided on this After Visit Summary.  MyChart is used to connect with patients for Virtual Visits (Telemedicine).  Patients are able to view lab/test results, encounter notes, upcoming appointments, etc.  Non-urgent  messages can be sent to your provider as well.   To learn more about what you can do with MyChart, go to [email or web address].    Your next appointment:   1 month(s)  The format for your next appointment:   In Person  Provider:   Bernerd Pho, PA-C or Ermalinda Barrios, PA-C   Other Instructions None     Thank you for choosing [facility] !

## 2020-06-02 ENCOUNTER — Other Ambulatory Visit: Payer: Self-pay | Admitting: Family Medicine

## 2020-06-02 DIAGNOSIS — N400 Enlarged prostate without lower urinary tract symptoms: Secondary | ICD-10-CM

## 2020-06-03 ENCOUNTER — Ambulatory Visit (INDEPENDENT_AMBULATORY_CARE_PROVIDER_SITE_OTHER): Payer: Medicare HMO | Admitting: *Deleted

## 2020-06-03 DIAGNOSIS — Z5181 Encounter for therapeutic drug level monitoring: Secondary | ICD-10-CM | POA: Diagnosis not present

## 2020-06-03 DIAGNOSIS — I4891 Unspecified atrial fibrillation: Secondary | ICD-10-CM | POA: Diagnosis not present

## 2020-06-03 LAB — POCT INR: INR: 2.9 (ref 2.0–3.0)

## 2020-06-03 NOTE — Patient Instructions (Signed)
Pre-DCCV (4th)   DCCV on 06/10/20 Continue warfarin 1 tablet daily except 1/2 tablet on Sundays and Wednesdays Recheck in 1 week

## 2020-06-07 NOTE — Patient Instructions (Signed)
[name]  06/07/2020     @PREFPERIOPPHARMACY @   Your procedure is scheduled on  06/10/2020.  Report to [facility] at  0830  A.M.  Call this number if you have problems the morning of surgery:  [phone]   Remember:  Do not eat or drink after midnight.                         Take these medicines the morning of surgery with A SIP OF WATER  Allopurinol, amlodipine, prilosec, flomax. DO NOT MISS ANY DOSES OF YOUR COUMADIN.    Do not wear jewelry, make-up or nail polish.  Do not wear lotions, powders, or perfumes. Please wear deodorant and brush your teeth.  Do not shave 48 hours prior to surgery.  Men may shave face and neck.  Do not bring valuables to the hospital.  [facility] is not responsible for any belongings or valuables.  Contacts, dentures or bridgework may not be worn into surgery.  Leave your suitcase in the car.  After surgery it may be brought to your room.  For patients admitted to the hospital, discharge time will be determined by your treatment team.  Patients discharged the day of surgery will not be allowed to drive home.   Name and phone number of your driver:   family Special instructions:  DO NOT smoke the morning of your procedure.  Please read over the following fact sheets that you were given. Anesthesia Post-op Instructions and Care and Recovery After Surgery       Electrical Cardioversion Electrical cardioversion is the delivery of a jolt of electricity to restore a normal rhythm to the heart. A rhythm that is too fast or is not regular keeps the heart from pumping well. In this procedure, sticky patches or metal paddles are placed on the chest to deliver electricity to the heart from a device. This procedure may be done in an emergency if:  There is low or no blood pressure as a result of the heart rhythm.  Normal rhythm must be restored as fast as possible to protect the brain and heart from further damage.  It may save a  life. This may also be a scheduled procedure for irregular or fast heart rhythms that are not immediately life-threatening. Tell a health care provider about:  Any allergies you have.  All medicines you are taking, including vitamins, herbs, eye drops, creams, and over-the-counter medicines.  Any problems you or family members have had with anesthetic medicines.  Any blood disorders you have.  Any surgeries you have had.  Any medical conditions you have.  Whether you are pregnant or may be pregnant. What are the risks? Generally, this is a safe procedure. However, problems may occur, including:  Allergic reactions to medicines.  A blood clot that breaks free and travels to other parts of your body.  The possible return of an abnormal heart rhythm within hours or days after the procedure.  Your heart stopping (cardiac arrest). This is rare. What happens before the procedure? Medicines  Your health care provider may have you start taking: ? Blood-thinning medicines (anticoagulants) so your blood does not clot as easily. ? Medicines to help stabilize your heart rate and rhythm.  Ask your health care provider about: ? Changing or stopping your regular medicines. This is especially important if you are taking diabetes medicines or blood thinners. ? Taking medicines such as aspirin  and ibuprofen. These medicines can thin your blood. Do not take these medicines unless your health care provider tells you to take them. ? Taking over-the-counter medicines, vitamins, herbs, and supplements. General instructions  Follow instructions from your health care provider about eating or drinking restrictions.  Plan to have someone take you home from the hospital or clinic.  If you will be going home right after the procedure, plan to have someone with you for 24 hours.  Ask your health care provider what steps will be taken to help prevent infection. These may include washing your skin with  a germ-killing soap. What happens during the procedure?   An IV will be inserted into one of your veins.  Sticky patches (electrodes) or metal paddles may be placed on your chest.  You will be given a medicine to help you relax (sedative).  An electrical shock will be delivered. The procedure may vary among health care providers and hospitals. What can I expect after the procedure?  Your blood pressure, heart rate, breathing rate, and blood oxygen level will be monitored until you leave the hospital or clinic.  Your heart rhythm will be watched to make sure it does not change.  You may have some redness on the skin where the shocks were given. Follow these instructions at home:  Do not drive for 24 hours if you were given a sedative during your procedure.  Take over-the-counter and prescription medicines only as told by your health care provider.  Ask your health care provider how to check your pulse. Check it often.  Rest for 48 hours after the procedure or as told by your health care provider.  Avoid or limit your caffeine use as told by your health care provider.  Keep all follow-up visits as told by your health care provider. This is important. Contact a health care provider if:  You feel like your heart is beating too quickly or your pulse is not regular.  You have a serious muscle cramp that does not go away. Get help right away if:  You have discomfort in your chest.  You are dizzy or you feel faint.  You have trouble breathing or you are short of breath.  Your speech is slurred.  You have trouble moving an arm or leg on one side of your body.  Your fingers or toes turn cold or blue. Summary  Electrical cardioversion is the delivery of a jolt of electricity to restore a normal rhythm to the heart.  This procedure may be done right away in an emergency or may be a scheduled procedure if the condition is not an emergency.  Generally, this is a safe  procedure.  After the procedure, check your pulse often as told by your health care provider. This information is not intended to replace advice given to you by your health care provider. Make sure you discuss any questions you have with your health care provider. Document Revised: 02/24/2019 Document Reviewed: 02/24/2019 Elsevier Patient Education  [place] After These instructions provide you with information about caring for yourself after your procedure. Your health care provider may also give you more specific instructions. Your treatment has been planned according to current medical practices, but problems sometimes occur. Call your health care provider if you have any problems or questions after your procedure. What can I expect after the procedure? After your procedure, you may:  Feel sleepy for several hours.  Feel clumsy and have poor  balance for several hours.  Feel forgetful about what happened after the procedure.  Have poor judgment for several hours.  Feel nauseous or vomit.  Have a sore throat if you had a breathing tube during the procedure. Follow these instructions at home: For at least 24 hours after the procedure:      Have a responsible adult stay with you. It is important to have someone help care for you until you are awake and alert.  Rest as needed.  Do not: ? Participate in activities in which you could fall or become injured. ? Drive. ? Use heavy machinery. ? Drink alcohol. ? Take sleeping pills or medicines that cause drowsiness. ? Make important decisions or sign legal documents. ? Take care of children on your own. Eating and drinking  Follow the diet that is recommended by your health care provider.  If you vomit, drink water, juice, or soup when you can drink without vomiting.  Make sure you have little or no nausea before eating solid foods. General instructions  Take over-the-counter and  prescription medicines only as told by your health care provider.  If you have sleep apnea, surgery and certain medicines can increase your risk for breathing problems. Follow instructions from your health care provider about wearing your sleep device: ? Anytime you are sleeping, including during daytime naps. ? While taking prescription pain medicines, sleeping medicines, or medicines that make you drowsy.  If you smoke, do not smoke without supervision.  Keep all follow-up visits as told by your health care provider. This is important. Contact a health care provider if:  You keep feeling nauseous or you keep vomiting.  You feel light-headed.  You develop a rash.  You have a fever. Get help right away if:  You have trouble breathing. Summary  For several hours after your procedure, you may feel sleepy and have poor judgment.  Have a responsible adult stay with you for at least 24 hours or until you are awake and alert. This information is not intended to replace advice given to you by your health care provider. Make sure you discuss any questions you have with your health care provider. Document Revised: 10/22/2017 Document Reviewed: 11/14/2015 Elsevier Patient Education  [place].

## 2020-06-08 ENCOUNTER — Encounter (HOSPITAL_COMMUNITY): Payer: Self-pay

## 2020-06-08 ENCOUNTER — Telehealth: Payer: Self-pay | Admitting: Cardiology

## 2020-06-08 ENCOUNTER — Encounter (HOSPITAL_COMMUNITY)
Admission: RE | Admit: 2020-06-08 | Discharge: 2020-06-08 | Disposition: A | Discharge status: Home / Self Care | Payer: Medicare HMO | Source: Ambulatory Visit | Attending: Cardiology | Admitting: Cardiology

## 2020-06-08 ENCOUNTER — Other Ambulatory Visit (HOSPITAL_COMMUNITY)
Admission: RE | Admit: 2020-06-08 | Discharge: 2020-06-08 | Disposition: A | Discharge status: Home / Self Care | Payer: Medicare HMO | Source: Ambulatory Visit | Attending: Cardiology | Admitting: Cardiology

## 2020-06-08 ENCOUNTER — Other Ambulatory Visit: Payer: Self-pay

## 2020-06-08 ENCOUNTER — Other Ambulatory Visit: Payer: Self-pay | Admitting: Cardiology

## 2020-06-08 DIAGNOSIS — Z20822 Contact with and (suspected) exposure to covid-19: Secondary | ICD-10-CM | POA: Diagnosis not present

## 2020-06-08 DIAGNOSIS — Z01812 Encounter for preprocedural laboratory examination: Secondary | ICD-10-CM | POA: Diagnosis not present

## 2020-06-08 LAB — BASIC METABOLIC PANEL
Anion gap: 7 (ref 5–15)
BUN: 9 mg/dL (ref 8–23)
CO2: 25 mmol/L (ref 22–32)
Calcium: 9 mg/dL (ref 8.9–10.3)
Chloride: 103 mmol/L (ref 98–111)
Creatinine, Ser: 0.78 mg/dL (ref 0.61–1.24)
GFR, Estimated: >60 mL/min (ref >60)
Glucose, Bld: 126 mg/dL — ABNORMAL HIGH (ref 70–99)
Potassium: 3.9 mmol/L (ref 3.5–5.1)
Sodium: 135 mmol/L (ref 135–145)

## 2020-06-08 LAB — CBC WITH DIFFERENTIAL/PLATELET
Abs Immature Granulocytes: 0.02 10*3/uL (ref 0.00–0.07)
Basophils Absolute: 0 10*3/uL (ref 0.0–0.1)
Basophils Relative: 1 %
Eosinophils Absolute: 0.1 10*3/uL (ref 0.0–0.5)
Eosinophils Relative: 2 %
HCT: 39.4 % (ref 39.0–52.0)
Hemoglobin: 13.4 g/dL (ref 13.0–17.0)
Immature Granulocytes: 0 %
Lymphocytes Relative: 37 %
Lymphs Abs: 2 10*3/uL (ref 0.7–4.0)
MCH: 34 pg (ref 26.0–34.0)
MCHC: 34 g/dL (ref 30.0–36.0)
MCV: 100 fL (ref 80.0–100.0)
Monocytes Absolute: 0.4 10*3/uL (ref 0.1–1.0)
Monocytes Relative: 7 %
Neutro Abs: 2.9 10*3/uL (ref 1.7–7.7)
Neutrophils Relative %: 53 %
Platelets: 185 10*3/uL (ref 150–400)
RBC: 3.94 MIL/uL — ABNORMAL LOW (ref 4.22–5.81)
RDW: 14 % (ref 11.5–15.5)
WBC: 5.3 10*3/uL (ref 4.0–10.5)
nRBC: 0 % (ref 0.0–0.2)

## 2020-06-08 LAB — PROTIME-INR
INR: 2.3 — ABNORMAL HIGH (ref 0.8–1.2)
Prothrombin Time: 24.4 seconds — ABNORMAL HIGH (ref 11.4–15.2)

## 2020-06-08 NOTE — Telephone Encounter (Signed)
New message    Needs preop orders put in  - patient having cardioversion on 06/10/20

## 2020-06-09 ENCOUNTER — Ambulatory Visit (INDEPENDENT_AMBULATORY_CARE_PROVIDER_SITE_OTHER): Payer: Medicare HMO | Admitting: *Deleted

## 2020-06-09 DIAGNOSIS — Z5181 Encounter for therapeutic drug level monitoring: Secondary | ICD-10-CM

## 2020-06-09 DIAGNOSIS — I4891 Unspecified atrial fibrillation: Secondary | ICD-10-CM

## 2020-06-09 LAB — SARS CORONAVIRUS 2 (TAT 6-24 HRS): SARS Coronavirus 2: NEGATIVE

## 2020-06-09 LAB — POCT INR: INR: 2.2 (ref 2.0–3.0)

## 2020-06-09 NOTE — Patient Instructions (Signed)
Pre-DCCV (5th)   DCCV on 06/10/20 Take warfarin 1 tablet tonight then resume 1 tablet daily except 1/2 tablet on Sundays and Wednesdays Recheck in 1 week

## 2020-06-10 ENCOUNTER — Encounter (HOSPITAL_COMMUNITY)
Admission: RE | Disposition: A | Discharge status: Home / Self Care | Payer: Self-pay | Source: Home / Self Care | Attending: Cardiology

## 2020-06-10 ENCOUNTER — Ambulatory Visit (HOSPITAL_COMMUNITY): Payer: Medicare HMO | Admitting: Certified Registered"

## 2020-06-10 ENCOUNTER — Encounter (HOSPITAL_COMMUNITY): Payer: Self-pay | Admitting: Cardiology

## 2020-06-10 ENCOUNTER — Ambulatory Visit (HOSPITAL_COMMUNITY)
Admission: RE | Admit: 2020-06-10 | Discharge: 2020-06-10 | Disposition: A | Discharge status: Home / Self Care | Payer: Medicare HMO | Attending: Cardiology | Admitting: Cardiology

## 2020-06-10 DIAGNOSIS — I2510 Atherosclerotic heart disease of native coronary artery without angina pectoris: Secondary | ICD-10-CM | POA: Insufficient documentation

## 2020-06-10 DIAGNOSIS — F17210 Nicotine dependence, cigarettes, uncomplicated: Secondary | ICD-10-CM | POA: Insufficient documentation

## 2020-06-10 DIAGNOSIS — J449 Chronic obstructive pulmonary disease, unspecified: Secondary | ICD-10-CM | POA: Insufficient documentation

## 2020-06-10 DIAGNOSIS — I714 Abdominal aortic aneurysm, without rupture: Secondary | ICD-10-CM | POA: Insufficient documentation

## 2020-06-10 DIAGNOSIS — Z951 Presence of aortocoronary bypass graft: Secondary | ICD-10-CM | POA: Diagnosis not present

## 2020-06-10 DIAGNOSIS — I4891 Unspecified atrial fibrillation: Secondary | ICD-10-CM | POA: Diagnosis not present

## 2020-06-10 DIAGNOSIS — I4819 Other persistent atrial fibrillation: Secondary | ICD-10-CM | POA: Diagnosis not present

## 2020-06-10 DIAGNOSIS — I10 Essential (primary) hypertension: Secondary | ICD-10-CM | POA: Insufficient documentation

## 2020-06-10 DIAGNOSIS — E785 Hyperlipidemia, unspecified: Secondary | ICD-10-CM | POA: Insufficient documentation

## 2020-06-10 HISTORY — PX: CARDIOVERSION: SHX1299

## 2020-06-10 SURGERY — CARDIOVERSION
Anesthesia: General

## 2020-06-10 MED ORDER — PROPOFOL 10 MG/ML IV BOLUS
INTRAVENOUS | Status: DC | PRN
  Administered 2020-06-10: 70 mg via INTRAVENOUS

## 2020-06-10 MED ORDER — LIDOCAINE 2% (20 MG/ML) 5 ML SYRINGE
INTRAMUSCULAR | Status: DC | PRN
  Administered 2020-06-10: 50 mg via INTRAVENOUS

## 2020-06-10 MED ORDER — LACTATED RINGERS IV SOLN: INTRAVENOUS | Status: DC | PRN

## 2020-06-10 MED ORDER — LACTATED RINGERS IV SOLN
INTRAVENOUS | Status: DC
  Administered 2020-06-10: 1000 mL via INTRAVENOUS

## 2020-06-10 NOTE — Anesthesia Preprocedure Evaluation (Signed)
Anesthesia Evaluation  Patient identified by MRN, date of birth, ID band Patient awake    Reviewed: Allergy & Precautions, H&P , NPO status , Patient's Chart, lab work & pertinent test results, reviewed documented beta blocker date and time   Airway Mallampati: II  TM Distance: >3 FB Neck ROM: full    Dental no notable dental hx. (+) Edentulous Lower, Edentulous Upper, Upper Dentures, Lower Dentures   Pulmonary neg pulmonary ROS, shortness of breath, COPD, Current Smoker and Patient abstained from smoking.,    Pulmonary exam normal breath sounds clear to auscultation       Cardiovascular Exercise Tolerance: Good hypertension, + angina + CAD and + Past MI  negative cardio ROS   Rhythm:regular Rate:Normal     Neuro/Psych PSYCHIATRIC DISORDERS  Neuromuscular disease negative neurological ROS  negative psych ROS   GI/Hepatic Neg liver ROS, GERD  Medicated,  Endo/Other  negative endocrine ROS  Renal/GU negative Renal ROS  negative genitourinary   Musculoskeletal  (+) Arthritis ,   Abdominal   Peds negative pediatric ROS (+)  Hematology negative hematology ROS (+)   Anesthesia Other Findings   Reproductive/Obstetrics negative OB ROS                             Anesthesia Physical Anesthesia Plan  ASA: III  Anesthesia Plan: General   Post-op Pain Management:    Induction:   PONV Risk Score and Plan: Propofol infusion  Airway Management Planned:   Additional Equipment:   Intra-op Plan:   Post-operative Plan:   Informed Consent: I have reviewed the patients History and Physical, chart, labs and discussed the procedure including the risks, benefits and alternatives for the proposed anesthesia with the patient or authorized representative who has indicated his/her understanding and acceptance.     Dental Advisory Given  Plan Discussed with: CRNA  Anesthesia Plan Comments:          Anesthesia Quick Evaluation

## 2020-06-10 NOTE — Anesthesia Postprocedure Evaluation (Signed)
Anesthesia Post Note  Patient: [name]  Procedure(s) Performed: CARDIOVERSION (N/A )  Patient location during evaluation: Phase II Anesthesia Type: General Level of consciousness: awake and alert and oriented Pain management: pain level controlled Vital Signs Assessment: post-procedure vital signs reviewed and stable Respiratory status: spontaneous breathing, nonlabored ventilation and respiratory function stable Cardiovascular status: blood pressure returned to baseline and stable Postop Assessment: no apparent nausea or vomiting Anesthetic complications: no   No complications documented.   Last Vitals:  Vitals:   06/10/20 1015 06/10/20 1027  BP: 139/73 (!) 150/84  Pulse: 60 64  Resp: 14 18  Temp:  36.7 C  SpO2: 100% 100%    Last Pain:  Vitals:   06/10/20 1027  TempSrc: Oral  PainSc: 0-No pain                 Orlie Dakin

## 2020-06-10 NOTE — Transfer of Care (Signed)
Immediate Anesthesia Transfer of Care Note  Patient: [name]  Procedure(s) Performed: CARDIOVERSION (N/A )  Patient Location: PACU  Anesthesia Type:General  Level of Consciousness: awake, alert  and oriented  Airway & Oxygen Therapy: Patient Spontanous Breathing  Post-op Assessment: Report given to RN and Post -op Vital signs reviewed and stable  Post vital signs: Reviewed and stable  Last Vitals:  Vitals Value Taken Time  BP    Temp    Pulse 52 06/10/20 0937  Resp 17 06/10/20 0937  SpO2 100 % 06/10/20 0937  Vitals shown include unvalidated device data.  Last Pain:  Vitals:   06/10/20 0851  TempSrc: Oral  PainSc: 0-No pain      Patients Stated Pain Goal: 6 ([id number])  Complications: No complications documented.

## 2020-06-10 NOTE — Discharge Instructions (Signed)
THERE ARE NO CHANGES TO YOUR MEDICATIONS. RESUME TAKING YOUR MEDICATIONS AS SCHEDULED    Monitored Anesthesia Care, Care After These instructions provide you with information about caring for yourself after your procedure. Your health care provider may also give you more specific instructions. Your treatment has been planned according to current medical practices, but problems sometimes occur. Call your health care provider if you have any problems or questions after your procedure. What can I expect after the procedure? After your procedure, you may:  Feel sleepy for several hours.  Feel clumsy and have poor balance for several hours.  Feel forgetful about what happened after the procedure.  Have poor judgment for several hours.  Feel nauseous or vomit.  Have a sore throat if you had a breathing tube during the procedure. Follow these instructions at home: For at least 24 hours after the procedure:      Have a responsible adult stay with you. It is important to have someone help care for you until you are awake and alert.  Rest as needed.  Do not: ? Participate in activities in which you could fall or become injured. ? Drive. ? Use heavy machinery. ? Drink alcohol. ? Take sleeping pills or medicines that cause drowsiness. ? Make important decisions or sign legal documents. ? Take care of children on your own. Eating and drinking  Follow the diet that is recommended by your health care provider.  If you vomit, drink water, juice, or soup when you can drink without vomiting.  Make sure you have little or no nausea before eating solid foods. General instructions  Take over-the-counter and prescription medicines only as told by your health care provider.  If you have sleep apnea, surgery and certain medicines can increase your risk for breathing problems. Follow instructions from your health care provider about wearing your sleep device: ? Anytime you are sleeping,  including during daytime naps. ? While taking prescription pain medicines, sleeping medicines, or medicines that make you drowsy.  If you smoke, do not smoke without supervision.  Keep all follow-up visits as told by your health care provider. This is important. Contact a health care provider if:  You keep feeling nauseous or you keep vomiting.  You feel light-headed.  You develop a rash.  You have a fever. Get help right away if:  You have trouble breathing. Summary  For several hours after your procedure, you may feel sleepy and have poor judgment.  Have a responsible adult stay with you for at least 24 hours or until you are awake and alert. This information is not intended to replace advice given to you by your health care provider. Make sure you discuss any questions you have with your health care provider. Document Revised: 10/22/2017 Document Reviewed: 11/14/2015 Elsevier Patient Education  [place].   Hospital doctor cardioversion is the delivery of a jolt of electricity to restore a normal rhythm to the heart. A rhythm that is too fast or is not regular keeps the heart from pumping well. In this procedure, sticky patches or metal paddles are placed on the chest to deliver electricity to the heart from a device. This procedure may be done in an emergency if:  There is low or no blood pressure as a result of the heart rhythm.  Normal rhythm must be restored as fast as possible to protect the brain and heart from further damage.  It may save a life. This may also be a scheduled procedure  for irregular or fast heart rhythms that are not immediately life-threatening. Tell a health care provider about:  Any allergies you have.  All medicines you are taking, including vitamins, herbs, eye drops, creams, and over-the-counter medicines.  Any problems you or family members have had with anesthetic medicines.  Any blood disorders you  have.  Any surgeries you have had.  Any medical conditions you have.  Whether you are pregnant or may be pregnant. What are the risks? Generally, this is a safe procedure. However, problems may occur, including:  Allergic reactions to medicines.  A blood clot that breaks free and travels to other parts of your body.  The possible return of an abnormal heart rhythm within hours or days after the procedure.  Your heart stopping (cardiac arrest). This is rare. What happens before the procedure? Medicines  Your health care provider may have you start taking: ? Blood-thinning medicines (anticoagulants) so your blood does not clot as easily. ? Medicines to help stabilize your heart rate and rhythm.  Ask your health care provider about: ? Changing or stopping your regular medicines. This is especially important if you are taking diabetes medicines or blood thinners. ? Taking medicines such as aspirin and ibuprofen. These medicines can thin your blood. Do not take these medicines unless your health care provider tells you to take them. ? Taking over-the-counter medicines, vitamins, herbs, and supplements. General instructions  Follow instructions from your health care provider about eating or drinking restrictions.  Plan to have someone take you home from the hospital or clinic.  If you will be going home right after the procedure, plan to have someone with you for 24 hours.  Ask your health care provider what steps will be taken to help prevent infection. These may include washing your skin with a germ-killing soap. What happens during the procedure?   An IV will be inserted into one of your veins.  Sticky patches (electrodes) or metal paddles may be placed on your chest.  You will be given a medicine to help you relax (sedative).  An electrical shock will be delivered. The procedure may vary among health care providers and hospitals. What can I expect after the  procedure?  Your blood pressure, heart rate, breathing rate, and blood oxygen level will be monitored until you leave the hospital or clinic.  Your heart rhythm will be watched to make sure it does not change.  You may have some redness on the skin where the shocks were given. Follow these instructions at home:  Do not drive for 24 hours if you were given a sedative during your procedure.  Take over-the-counter and prescription medicines only as told by your health care provider.  Ask your health care provider how to check your pulse. Check it often.  Rest for 48 hours after the procedure or as told by your health care provider.  Avoid or limit your caffeine use as told by your health care provider.  Keep all follow-up visits as told by your health care provider. This is important. Contact a health care provider if:  You feel like your heart is beating too quickly or your pulse is not regular.  You have a serious muscle cramp that does not go away. Get help right away if:  You have discomfort in your chest.  You are dizzy or you feel faint.  You have trouble breathing or you are short of breath.  Your speech is slurred.  You have trouble moving an arm  or leg on one side of your body.  Your fingers or toes turn cold or blue. Summary  Electrical cardioversion is the delivery of a jolt of electricity to restore a normal rhythm to the heart.  This procedure may be done right away in an emergency or may be a scheduled procedure if the condition is not an emergency.  Generally, this is a safe procedure.  After the procedure, check your pulse often as told by your health care provider. This information is not intended to replace advice given to you by your health care provider. Make sure you discuss any questions you have with your health care provider. Document Revised: 02/24/2019 Document Reviewed: 02/24/2019 Elsevier Patient Education  [place].

## 2020-06-10 NOTE — Anesthesia Procedure Notes (Signed)
Date/Time: 06/10/2020 9:24 AM Performed by: Orlie Dakin, CRNA Pre-anesthesia Checklist: Patient identified, Emergency Drugs available, Suction available and Patient being monitored Oxygen Delivery Method: Nasal cannula Induction Type: IV induction Placement Confirmation: positive ETCO2

## 2020-06-10 NOTE — CV Procedure (Signed)
CV Procedure Note  Procedure: Electrical cardioversion Indication: Persistent afib Physician: Dr [name] MD   Patient was brought to the procedure suite after appropriate consent was obtained. Sedation achieved with the assistance of anesthesiology, please see there note for full detais. Defib pads placed in the anterior and posterior positions, succesfully converted with a single synchronized 200j shock from afib to normal sinus rhythm. Cardiopulmoanry monitor was performed throughout the procedure, he tolerated well without complications.   [name] MD

## 2020-06-10 NOTE — Progress Notes (Signed)
Electrical Cardioversion Procedure Note [name] [id number] Feb 24, 1946  Procedure: Electrical Cardioversion Indications:  Atrial fibrillation  Procedure Details Consent: on chart signed by [name] checked during time out Time Out: Verified patient identification, verified procedure, site/side was marked, verified correct patient position, special equipment/implants available, medications/allergies/relevent history reviewed, required imaging and test results available.  0923  Patient placed on cardiac monitor, pulse oximetry, supplemental oxygen as necessary.  Sedation given: per anesthesia [phone] Pacer pads placed   Cardioverted 1 time(s).  Cardioverted at 200 Joules  Evaluation Findings: Post procedure EKG shows: sinus Bradycardia, checked by [name] Complications: none Patient did   Lorna Few 06/10/2020, 9:46 AM

## 2020-06-10 NOTE — H&P (Signed)
Procedure H&P  Patient presents for elective electrical cardioversion for persistent atrial fibrillation. Please see the recent clinic note referenced below for full medical history. He is on coumadin has been therapeutic.   Yesterday INR 2.2 10/28 2.9 10/21 3.3  10/14 3.5 10/7 4.3   Ralph Dolly MD   Clinical Summary [name] is a 74 y.o.male seen today for follow up of the following medical problems.  1. CAD  - prior MI in 1997, prior CABG approx 13 years ago at [facility]  - cath 2011 showed patent grafts. 04/2013 echo shows LVEF 60%  - myoview 12/2012 shows old inferolateral scar, no ischemia    - no recent chest pain. No SOB or DOE - compliant with meds   2. HTN  - he remains compliant with meds - lisionpril stopped by pcp per patient report. He did have some elevation of K after starting. - compliant with meds.    3. HL  - 05/2020 TC 131 TG 65 HDL 49 LDL 68 - compliant with statin.   4COPD -followed by pcp   5. AAA screen -negative [place] 06/2016  6. Afib - new diagnosis during 11/2019 preop evaluation -occasional palpitations, just a few seconds. - he is already on metoprolol - had some hematuria about 1 month ago.  - some blood toilet paper with wiping, colonscopy 11/2017 hemorroids   - eliquis was too expensive, chagned to coumadin - occasional palpitations at night, few times at night. Lasts about 10 minutes - we had discussed a cardioversion last visit, coumadin levels were labile and we had not pursued. From chart review has been therapeutic the last 3 weeks          Past Medical History:  Diagnosis Date  . Anginal pain ([place])   . Atrial fibrillation by electrocardiogram ([place]) 11/2019  . Atrial flutter by electrocardiogram ([place]) 11/2019  . Bladder tumor   . BPH (benign prostatic hyperplasia)   . CAD (coronary artery disease)    a. s/p INF MI 1997;  b. s/p CABG 2001;  c. [place] 11/2009:  3v CAD, S-PDA ok with  40% mid, S-OM ok, S-Dx ok, L-LAD ok, EF 50%;  d.  [place] 5/14:  Inferolateral scar, EF 46%, no ischemia  . COPD (chronic obstructive pulmonary disease) ([place])   . Dyspnea   . ETOH abuse   . GERD (gastroesophageal reflux disease)   . History of gout   . Hypercholesterolemia   . Hypertension   . Myocardial infarction ([place])   . Prostate cancer ([place])   . Tobacco abuse      No Known Allergies         Current Outpatient Medications  Medication Sig Dispense Refill  . acetaminophen (TYLENOL) 500 MG tablet Take 2 tablets (1,000 mg total) by mouth every 6 (six) hours as needed for moderate pain or headache. 90 tablet 0  . albuterol (VENTOLIN HFA) 108 (90 Base) MCG/ACT inhaler INHALE TWO PUFFS INTO THE LUNGS EVERY 6 HOURS AS NEEDED FOR  WHEEZING  OR  SHORTNESS  OF  BREATH (Patient taking differently: Inhale 2 puffs into the lungs every 6 (six) hours as needed for wheezing or shortness of breath. ) 54 g 3  . allopurinol (ZYLOPRIM) 100 MG tablet TAKE 1 TABLET EVERY DAY 90 tablet 1  . amLODipine (NORVASC) 5 MG tablet Take 1 tablet (5 mg total) by mouth daily. 90 tablet 2  . atorvastatin (LIPITOR) 80 MG tablet Take 1 tablet (80 mg total) by mouth  daily at 6 PM. 90 tablet 2  . Cholecalciferol (VITAMIN D3) 50 MCG (2000 UT) TABS Take by mouth.    . ferrous sulfate 325 (65 FE) MG tablet Take 1 tablet (325 mg total) by mouth daily with breakfast. 30 tablet 5  . fluticasone (FLONASE) 50 MCG/ACT nasal spray Place 2 sprays into both nostrils daily as needed for allergies. 48 g 2  . Fluticasone-Umeclidin-Vilant (TRELEGY ELLIPTA) 100-62.5-25 MCG/INH AEPB Inhale 1 puff into the lungs every morning. 180 each 0  . metoprolol tartrate (LOPRESSOR) 25 MG tablet Take 0.5 tablets (12.5 mg total) by mouth 2 (two) times daily. 90 tablet 2  . nitroGLYCERIN (NITROSTAT) 0.4 MG SL tablet Place 1 tablet (0.4 mg total) under the tongue every 5 (five) minutes as needed for chest pain. 75 tablet 2  . omeprazole  (PRILOSEC) 20 MG capsule Take 1 capsule (20 mg total) by mouth daily. 90 capsule 2  . tamsulosin (FLOMAX) 0.4 MG CAPS capsule Take 1 capsule (0.4 mg total) by mouth daily. 90 capsule 0  . varenicline (CHANTIX CONTINUING MONTH PAK) 1 MG tablet Take 1 tablet (1 mg total) by mouth 2 (two) times daily. 60 tablet 5  . varenicline (CHANTIX STARTING MONTH PAK) 0.5 MG X 11 & 1 MG X 42 tablet Use according to package directions 53 tablet 0  . warfarin (COUMADIN) 5 MG tablet TAKE 1 TABLET EVERY DAY 90 tablet 0   No current facility-administered medications for this visit.          Past Surgical History:  Procedure Laterality Date  . ANTERIOR CERVICAL DECOMPRESSION/DISCECTOMY FUSION 4 LEVELS N/A 07/21/2016   Procedure: ANTERIOR CERVICAL DECOMPRESSION/DISCECTOMY FUSION CERVICAL TWO-THREE, CERVICAL THREE-FOUR,. CERVICAL FOUR-FIVE, CERVICAL  FIVE-SIX;  Surgeon: Ralph Pall, MD;  Location: [place];  Service: Neurosurgery;  Laterality: N/A;  . BACK SURGERY    . CARDIAC CATHETERIZATION  2011  . COLONOSCOPY     in remote past, Dr. Sharlett [name]. Obtaining records.   . COLONOSCOPY N/A 11/21/2017   Procedure: COLONOSCOPY;  Surgeon: Ralph Dolin, MD;  [facility];  Service: Endoscopy;  Laterality: N/A;  1:15  . CORONARY ANGIOPLASTY    . CORONARY ARTERY BYPASS GRAFT    . ESOPHAGOGASTRODUODENOSCOPY  05/27/2012   [id number] esophagus, stomach and duodenum s/p dilator  . POLYPECTOMY  11/21/2017   Procedure: POLYPECTOMY;  Surgeon: Ralph Dolin, MD;  [facility];  Service: Endoscopy;;  colon  . PROSTATE SURGERY    . TRANSURETHRAL RESECTION OF BLADDER TUMOR WITH MITOMYCIN-C Left 01/06/2020   Procedure: CYSTOSCOPY TRANSURETHRAL RESECTION OF BLADDER TUMOR,LEFT URETERSCOPY AND GEMCITABINE;  Surgeon: Ralph Seal, MD;  [facility];  Service: Urology;  Laterality: Left;     No Known Allergies         Family History  Problem Relation Age of Onset  .  Diabetes Mother   . Hypertension Father   . Heart disease Brother   . Diabetes Brother   . Heart disease Brother   . Diabetes Brother   . Multiple sclerosis Daughter   . Healthy Son   . Colon cancer Neg Hx      Social History [name] reports that he has been smoking cigarettes and e-cigarettes. He started smoking about 50 years ago. He has a 28.50 pack-year smoking history. He quit smokeless tobacco use about 8 years ago.  His smokeless tobacco use included snuff. [name] reports current alcohol use of about 6.0 standard drinks of alcohol per week.   Review  of Systems CONSTITUTIONAL: No weight loss, fever, chills, weakness or fatigue.  HEENT: Eyes: No visual loss, blurred vision, double vision or yellow sclerae.No hearing loss, sneezing, congestion, runny nose or sore throat.  SKIN: No rash or itching.  CARDIOVASCULAR: per hpi RESPIRATORY: No shortness of breath, cough or sputum.  GASTROINTESTINAL: No anorexia, nausea, vomiting or diarrhea. No abdominal pain or blood.  GENITOURINARY: No burning on urination, no polyuria NEUROLOGICAL: No headache, dizziness, syncope, paralysis, ataxia, numbness or tingling in the extremities. No change in bowel or bladder control.  MUSCULOSKELETAL: No muscle, back pain, joint pain or stiffness.  LYMPHATICS: No enlarged nodes. No history of splenectomy.  PSYCHIATRIC: No history of depression or anxiety.  ENDOCRINOLOGIC: No reports of sweating, cold or heat intolerance. No polyuria or polydipsia.  Marland Kitchen   Physical Examination There were no vitals filed for this visit.    Filed Weights   06/01/20 1309  Weight: 149 lb 12.8 oz (67.9 kg)    Gen: resting comfortably, no acute distress HEENT: no scleral icterus, pupils equal round and reactive, no palptable cervical adenopathy,  CV: RRR, no m/r/g, no jvd Resp: Clear to auscultation bilaterally GI: abdomen is soft, non-tender, non-distended, normal bowel sounds, no  hepatosplenomegaly MSK: extremities are warm, no edema.  Skin: warm, no rash Neuro:  no focal deficits Psych: appropriate affect   Diagnostic Studies  11/25/2009:  Cardiac Cath Findings: Left ventricular angiogram was performed in the RAO projection and  showed normal left ventricular systolic function with ejection  fraction estimated at 50%. Mild mitral regurgitation was noted.  10.Aortic root angiogram was performed and did not show enlargement of  the aortic root.  IMPRESSION:  1. Severe triple-vessel coronary artery disease.  2. Patent bypass grafts 4/4.  3. Low normal left ventricular systolic function.   12/2012 Myoview  Inferolateral scar, no active ischemia, LVEF 46%.   05/01/13 Echo: LVEF 55-60%, grade II diastolic dysfunction, multiple WMAs, mild MR,   06/2016 AAA [place] No aneurysm    Assessment and Plan  1.CAD  - has not been on ACE-I due to prior elevatiion of potassium - no symptoms, continue to monitor.   2. HTN  -he is at goal, continue current meds  3. HL  -at goal, continue curren tmeds  4. Afib - new diagnosis during 11/2019 preop -eliquis too expensive, changed to coumadin  - INRs have been therapeutic, we discussed again cardioversion - EKG today shows he remains in afib, we will arrange an electrical cardioversion       Arnoldo Lenis, M.D.

## 2020-06-15 ENCOUNTER — Encounter (HOSPITAL_COMMUNITY): Payer: Self-pay | Admitting: Cardiology

## 2020-06-17 ENCOUNTER — Ambulatory Visit (INDEPENDENT_AMBULATORY_CARE_PROVIDER_SITE_OTHER): Payer: Medicare HMO | Admitting: *Deleted

## 2020-06-17 DIAGNOSIS — I4891 Unspecified atrial fibrillation: Secondary | ICD-10-CM

## 2020-06-17 DIAGNOSIS — Z5181 Encounter for therapeutic drug level monitoring: Secondary | ICD-10-CM

## 2020-06-17 LAB — POCT INR: INR: 2.2 (ref 2.0–3.0)

## 2020-06-17 NOTE — Patient Instructions (Signed)
Post-DCCV (1st)   DCCV on 06/10/20 Continue warfarin 1 tablet daily except 1/2 tablet on Sundays and Wednesdays Recheck in 1 week

## 2020-06-24 ENCOUNTER — Ambulatory Visit (INDEPENDENT_AMBULATORY_CARE_PROVIDER_SITE_OTHER): Payer: Medicare HMO | Admitting: *Deleted

## 2020-06-24 DIAGNOSIS — Z5181 Encounter for therapeutic drug level monitoring: Secondary | ICD-10-CM

## 2020-06-24 DIAGNOSIS — I4891 Unspecified atrial fibrillation: Secondary | ICD-10-CM | POA: Diagnosis not present

## 2020-06-24 LAB — POCT INR: INR: 2.8 (ref 2.0–3.0)

## 2020-06-24 NOTE — Patient Instructions (Signed)
Post-DCCV (2nd)   DCCV on 06/10/20 Continue warfarin 1 tablet daily except 1/2 tablet on Sundays and Wednesdays Recheck in 1 week

## 2020-06-25 ENCOUNTER — Other Ambulatory Visit (HOSPITAL_COMMUNITY): Payer: Self-pay

## 2020-06-25 ENCOUNTER — Encounter (HOSPITAL_COMMUNITY): Payer: Self-pay

## 2020-06-25 DIAGNOSIS — Z87891 Personal history of nicotine dependence: Secondary | ICD-10-CM

## 2020-06-25 DIAGNOSIS — Z122 Encounter for screening for malignant neoplasm of respiratory organs: Secondary | ICD-10-CM

## 2020-06-25 NOTE — Progress Notes (Signed)
Order placed for LDCT. Patient scheduled for 12/20 at 1300. Patient aware.

## 2020-06-29 ENCOUNTER — Other Ambulatory Visit: Payer: Self-pay

## 2020-06-29 ENCOUNTER — Ambulatory Visit (INDEPENDENT_AMBULATORY_CARE_PROVIDER_SITE_OTHER): Payer: Medicare HMO | Admitting: *Deleted

## 2020-06-29 DIAGNOSIS — I4891 Unspecified atrial fibrillation: Secondary | ICD-10-CM | POA: Diagnosis not present

## 2020-06-29 DIAGNOSIS — Z5181 Encounter for therapeutic drug level monitoring: Secondary | ICD-10-CM

## 2020-06-29 LAB — POCT INR: INR: 2.4 (ref 2.0–3.0)

## 2020-06-29 NOTE — Patient Instructions (Signed)
Post-DCCV (3rd)   DCCV on 06/10/20 Continue warfarin 1 tablet daily except 1/2 tablet on Sundays and Wednesdays Recheck in 1 week

## 2020-07-05 ENCOUNTER — Telehealth: Payer: Self-pay | Admitting: Cardiology

## 2020-07-05 NOTE — Telephone Encounter (Signed)
Nyquil has a decongestant which I would avoid, would be better to take mucinex or mucinex DM  [name] MD

## 2020-07-05 NOTE — Telephone Encounter (Signed)
New message   Per Mrs [name] has come down with a cold , is it okay for him to take Nyquil?

## 2020-07-05 NOTE — Telephone Encounter (Signed)
Pt voiced understanding

## 2020-07-07 ENCOUNTER — Ambulatory Visit: Payer: Medicare HMO | Admitting: General Practice

## 2020-07-08 ENCOUNTER — Ambulatory Visit (INDEPENDENT_AMBULATORY_CARE_PROVIDER_SITE_OTHER): Payer: Medicare HMO | Admitting: *Deleted

## 2020-07-08 DIAGNOSIS — I4891 Unspecified atrial fibrillation: Secondary | ICD-10-CM

## 2020-07-08 DIAGNOSIS — Z5181 Encounter for therapeutic drug level monitoring: Secondary | ICD-10-CM | POA: Diagnosis not present

## 2020-07-08 LAB — POCT INR: INR: 3.3 — AB (ref 2.0–3.0)

## 2020-07-08 NOTE — Patient Instructions (Signed)
Post-DCCV (4th)   DCCV on 06/10/20 Hold warfarin tonight then resume 1 tablet daily except 1/2 tablet on Sundays and Wednesdays Recheck in 3 weeks

## 2020-07-11 NOTE — Progress Notes (Signed)
Cardiology Office Note  Date: 07/12/2020   ID: [name], DOB [birth year], MRN [id number]  PCP:  [name], MD  Cardiologist:  [name], MD Electrophysiologist:  None   Chief Complaint: Follow-up CAD, HTN, atrial fibrillation  History of Present Illness: [name] is a 74 y.o. male with a history of CAD, HTN, atrial fibrillation, HLD, HTN, prostate CA.  Last encounter with [name] 06/01/2020.  Prior MI in 1997, prior CABG 13 years prior at [facility].  Cardiac cath 2011 patent graft, echocardiogram 2014 EF 60%.  Stress Myoview 2014 old inferolateral scar, no ischemia.  No recent chest pain, shortness of breath, or dyspnea on exertion.  He remains compliant with antihypertensive medication and statin medication.  COPD followed by PCP.  New diagnosis of atrial fibrillation April 2021.  Was continue metoprolol.  Occasional palpitations.  Continuing Coumadin.  At a prior visit cardioversion a.m. had been discussed.  Coumadin levels were labile and cardioversion had not been pursued.  INRs had been therapeutic for the prior 3 weeks leading up to the visit.  Cardioversion was scheduled.    Cardioversion 06/10/2020: Succesfully converted with a single synchronized 200j shock from afib to normal sinus rhythm.  He is here today for follow-up status post DCCV.  States he feels better since having the cardioversion.  He denies any sensation of palpitations or arrhythmias.  States he occasionally feels a skipped beat.  EKG today shows he is in sinus rhythm with frequent PVCs in a bigeminal pattern.  Rate of 73.  He denies any anginal or exertional symptoms, orthostatic symptoms, CVA or TIA-like symptoms, PND, orthopnea, bleeding, claudication-like symptoms, DVT or PE-like symptoms, or lower extremity edema.  Blood pressure today is 104/68  Past Medical History:  Diagnosis Date  . Anginal pain ([place])   . Atrial fibrillation by electrocardiogram ([place]) 11/2019  . Atrial flutter by  electrocardiogram ([place]) 11/2019  . Bladder cancer ([place]) 01/2020  . Bladder tumor   . BPH (benign prostatic hyperplasia)   . CAD (coronary artery disease)    a. s/p INF MI 1997;  b. s/p CABG 2001;  c. [place] 11/2009:  3v CAD, S-PDA ok with 40% mid, S-OM ok, S-Dx ok, L-LAD ok, EF 50%;  d.  [place] 5/14:  Inferolateral scar, EF 46%, no ischemia  . COPD (chronic obstructive pulmonary disease) ([place])   . Dyspnea   . ETOH abuse   . GERD (gastroesophageal reflux disease)   . History of gout   . Hypercholesterolemia   . Hypertension   . Myocardial infarction ([place]) 2013  . Prostate cancer ([place])   . Tobacco abuse     Past Surgical History:  Procedure Laterality Date  . ANTERIOR CERVICAL DECOMPRESSION/DISCECTOMY FUSION 4 LEVELS N/A 07/21/2016   Procedure: ANTERIOR CERVICAL DECOMPRESSION/DISCECTOMY FUSION CERVICAL TWO-THREE, CERVICAL THREE-FOUR,. CERVICAL FOUR-FIVE, CERVICAL  FIVE-SIX;  Surgeon: Ashok Pall, MD;  Location: [facility];  Service: Neurosurgery;  Laterality: N/A;  . BACK SURGERY    . CARDIAC CATHETERIZATION  2011  . CARDIOVERSION N/A 06/10/2020   Procedure: CARDIOVERSION;  Surgeon: Arnoldo Lenis, MD;  [facility];  Service: Endoscopy;  Laterality: N/A;  . COLONOSCOPY     in remote past, Dr. Sharlett Iles. Obtaining records.   . COLONOSCOPY N/A 11/21/2017   Procedure: COLONOSCOPY;  Surgeon: Daneil Dolin, MD;  [facility];  Service: Endoscopy;  Laterality: N/A;  1:15  . CORONARY ANGIOPLASTY    . CORONARY ARTERY BYPASS GRAFT    .  ESOPHAGOGASTRODUODENOSCOPY  05/27/2012   [id number] esophagus, stomach and duodenum s/p dilator  . POLYPECTOMY  11/21/2017   Procedure: POLYPECTOMY;  Surgeon: Daneil Dolin, MD;  [facility];  Service: Endoscopy;;  colon  . PROSTATE SURGERY    . TRANSURETHRAL RESECTION OF BLADDER TUMOR WITH MITOMYCIN-C Left 01/06/2020   Procedure: CYSTOSCOPY TRANSURETHRAL RESECTION OF BLADDER TUMOR,LEFT URETERSCOPY AND GEMCITABINE;  Surgeon:  Irine Seal, MD;  [facility];  Service: Urology;  Laterality: Left;    Current Outpatient Medications  Medication Sig Dispense Refill  . acetaminophen (TYLENOL) 500 MG tablet Take 2 tablets (1,000 mg total) by mouth every 6 (six) hours as needed for moderate pain or headache. 90 tablet 0  . albuterol (VENTOLIN HFA) 108 (90 Base) MCG/ACT inhaler INHALE TWO PUFFS INTO THE LUNGS EVERY 6 HOURS AS NEEDED FOR  WHEEZING  OR  SHORTNESS  OF  BREATH (Patient taking differently: Inhale 2 puffs into the lungs every 6 (six) hours as needed for wheezing or shortness of breath. ) 54 g 3  . allopurinol (ZYLOPRIM) 100 MG tablet TAKE 1 TABLET EVERY DAY (Patient taking differently: Take 100 mg by mouth daily. ) 90 tablet 1  . amLODipine (NORVASC) 5 MG tablet Take 1 tablet (5 mg total) by mouth daily. 90 tablet 2  . atorvastatin (LIPITOR) 80 MG tablet Take 1 tablet (80 mg total) by mouth daily at 6 PM. 90 tablet 2  . Cholecalciferol (VITAMIN D3) 50 MCG (2000 UT) TABS Take 2,000 Units by mouth daily.     . ferrous sulfate 325 (65 FE) MG tablet Take 1 tablet (325 mg total) by mouth daily with breakfast. 30 tablet 5  . fluticasone (FLONASE) 50 MCG/ACT nasal spray Place 2 sprays into both nostrils daily as needed for allergies. 48 g 2  . Fluticasone-Umeclidin-Vilant (TRELEGY ELLIPTA) 100-62.5-25 MCG/INH AEPB Inhale 1 puff into the lungs every morning. 180 each 0  . metoprolol tartrate (LOPRESSOR) 25 MG tablet Take 0.5 tablets (12.5 mg total) by mouth 2 (two) times daily. 90 tablet 2  . nitroGLYCERIN (NITROSTAT) 0.4 MG SL tablet Place 1 tablet (0.4 mg total) under the tongue every 5 (five) minutes as needed for chest pain. 75 tablet 2  . omeprazole (PRILOSEC) 20 MG capsule Take 1 capsule (20 mg total) by mouth daily. 90 capsule 2  . tamsulosin (FLOMAX) 0.4 MG CAPS capsule TAKE 1 CAPSULE (0.4 MG TOTAL) BY MOUTH DAILY. 90 capsule 1  . warfarin (COUMADIN) 5 MG tablet TAKE 1 TABLET EVERY DAY (Patient taking differently:  Take 2.5-5 mg by mouth See admin instructions. Take 2.5 mg by mouth on Sunday and Wednesday, all remaining days take 5 mg) 90 tablet 0   No current facility-administered medications for this visit.   Allergies:  Patient has no known allergies.   Social History: The patient  reports that he has been smoking cigarettes and e-cigarettes. He started smoking about 50 years ago. He has a 28.50 pack-year smoking history. He quit smokeless tobacco use about 8 years ago.  His smokeless tobacco use included snuff. He reports current alcohol use of about 6.0 standard drinks of alcohol per week. He reports that he does not use drugs.   Family History: The patient's family history includes Diabetes in his brother, brother, and mother; Healthy in his son; Heart disease in his brother and brother; Hypertension in his father; Multiple sclerosis in his daughter.   ROS:  Please see the history of present illness. Otherwise, complete review of systems is  positive for none.  All other systems are reviewed and negative.   Physical Exam: VS:  BP 104/68   Pulse 70   Ht 5\' 6"  (1.676 m)   Wt 150 lb (68 kg)   SpO2 96%   BMI 24.21 kg/m , BMI Body mass index is 24.21 kg/m.  Wt Readings from Last 3 Encounters:  07/12/20 150 lb (68 kg)  06/08/20 150 lb (68 kg)  06/01/20 149 lb 12.8 oz (67.9 kg)    General: Patient appears comfortable at rest. Neck: Supple, no elevated JVP or carotid bruits, no thyromegaly. Lungs: Clear to auscultation, nonlabored breathing at rest. Cardiac: Regular rate and rhythm, no S3 or significant systolic murmur, no pericardial rub. Extremities: No pitting edema, distal pulses 2+. Skin: Warm and dry. Musculoskeletal: No kyphosis. Neuropsychiatric: Alert and oriented x3, affect grossly appropriate.  ECG:  An ECG dated 07/12/2020 was personally reviewed today and demonstrated:  Sinus rhythm with frequent premature ventricular complexes in a pattern of bigeminy rate of 73.  Recent  Labwork: 05/26/2020: ALT 15; AST 21 06/08/2020: BUN 9; Creatinine, Ser 0.78; Hemoglobin 13.4; Platelets 185; Potassium 3.9; Sodium 135     Component Value Date/Time   CHOL 131 05/26/2020 1106   TRIG 65 05/26/2020 1106   HDL 49 05/26/2020 1106   CHOLHDL 2.7 05/26/2020 1106   CHOLHDL 2.6 03/12/2015 1033   VLDL 16 03/12/2015 1033   LDLCALC 68 05/26/2020 1106    Other Studies Reviewed Today: 11/25/2009:  Cardiac Cath Findings: Left ventricular angiogram was performed in the RAO projection and  showed normal left ventricular systolic function with ejection  fraction estimated at 50%. Mild mitral regurgitation was noted.  10.Aortic root angiogram was performed and did not show enlargement of  the aortic root.  IMPRESSION:  1. Severe triple-vessel coronary artery disease.  2. Patent bypass grafts 4/4.  3. Low normal left ventricular systolic function.   12/2012 Myoview  Inferolateral scar, no active ischemia, LVEF 46%.   05/01/13 Echo: LVEF 55-60%, grade II diastolic dysfunction, multiple WMAs, mild MR,   06/2016 AAA [place] No aneurysm    Assessment and Plan:  1. CAD in native artery   2. Atrial fibrillation, unspecified type ([place])   3. Essential hypertension   4. Mixed hyperlipidemia    1. CAD in native artery Denies any anginal or exertional symptoms.  Continue nitroglycerin sublingual 0.4 mg as needed.  Continue metoprolol 12.5 mg p.o. twice daily  2. Atrial fibrillation, unspecified type [facility]) He is status post cardioversion on 06/10/2020.  He was successfully converted back to normal sinus rhythm with 200 J shock x1.  States he is doing well today.  Follow-up EKG shows sinus rhythm with frequent PVCs in a bigeminal pattern rate of 73 today.  Patient denies any recent bleeding on Coumadin.  Continue metoprolol 12.5 mg p.o. twice daily.  Continue Coumadin as directed by Coumadin clinic.  Follow-up with Coumadin clinic.  3. Essential hypertension Blood pressure  well controlled today.  Continue amlodipine 5 mg p.o. daily.  Continue metoprolol 12.5 mg p.o. twice daily.  4. Mixed hyperlipidemia Continue atorvastatin 80 mg p.o. daily.  Recent lipid panel 05/26/2020: TC 131, TG 65, HDL 49, LDL 68.  Medication Adjustments/Labs and Tests Ordered: Current medicines are reviewed at length with the patient today.  Concerns regarding medicines are outlined above.   Disposition: Follow-up with [name] or APP 6 months.  Signed, Levell July, NP 07/12/2020 2:47 PM    [place] [facility]  [address], [place], [place] [address] Phone: [phone]; Fax: [phone]

## 2020-07-12 ENCOUNTER — Encounter: Payer: Self-pay | Admitting: Family Medicine

## 2020-07-12 ENCOUNTER — Ambulatory Visit (INDEPENDENT_AMBULATORY_CARE_PROVIDER_SITE_OTHER): Payer: Medicare HMO | Admitting: Family Medicine

## 2020-07-12 VITALS — BP 104/68 | HR 70 | Ht 66.0 in | Wt 150.0 lb

## 2020-07-12 DIAGNOSIS — I4891 Unspecified atrial fibrillation: Secondary | ICD-10-CM

## 2020-07-12 DIAGNOSIS — I10 Essential (primary) hypertension: Secondary | ICD-10-CM

## 2020-07-12 DIAGNOSIS — E782 Mixed hyperlipidemia: Secondary | ICD-10-CM

## 2020-07-12 DIAGNOSIS — I2510 Atherosclerotic heart disease of native coronary artery without angina pectoris: Secondary | ICD-10-CM

## 2020-07-12 NOTE — Patient Instructions (Signed)
Your physician wants you to follow-up in: 6 MONTHS WITH DR BRANCH   Your physician recommends that you continue on your current medications as directed. Please refer to the Current Medication list given to you today.  Thank you for choosing [facility]!!   

## 2020-07-22 ENCOUNTER — Other Ambulatory Visit: Payer: Self-pay | Admitting: Family Medicine

## 2020-07-26 ENCOUNTER — Ambulatory Visit (HOSPITAL_COMMUNITY)
Admission: RE | Admit: 2020-07-26 | Discharge: 2020-07-26 | Disposition: A | Discharge status: Home / Self Care | Payer: Medicare HMO | Source: Ambulatory Visit | Attending: Oncology | Admitting: Oncology

## 2020-07-26 ENCOUNTER — Other Ambulatory Visit: Payer: Self-pay

## 2020-07-26 DIAGNOSIS — Z87891 Personal history of nicotine dependence: Secondary | ICD-10-CM | POA: Diagnosis not present

## 2020-07-26 DIAGNOSIS — F17210 Nicotine dependence, cigarettes, uncomplicated: Secondary | ICD-10-CM | POA: Diagnosis not present

## 2020-07-26 DIAGNOSIS — Z122 Encounter for screening for malignant neoplasm of respiratory organs: Secondary | ICD-10-CM | POA: Diagnosis not present

## 2020-07-26 NOTE — Progress Notes (Signed)
Bridgett Larsson, NP 07/26/2020 6:00 PM

## 2020-07-27 ENCOUNTER — Ambulatory Visit (INDEPENDENT_AMBULATORY_CARE_PROVIDER_SITE_OTHER): Payer: Medicare HMO | Admitting: *Deleted

## 2020-07-27 ENCOUNTER — Encounter (HOSPITAL_COMMUNITY): Payer: Self-pay

## 2020-07-27 DIAGNOSIS — I4891 Unspecified atrial fibrillation: Secondary | ICD-10-CM

## 2020-07-27 DIAGNOSIS — Z5181 Encounter for therapeutic drug level monitoring: Secondary | ICD-10-CM

## 2020-07-27 LAB — POCT INR: INR: 2.5 (ref 2.0–3.0)

## 2020-07-27 NOTE — Progress Notes (Signed)
[name] with [facility] radiology calling with report for Ralph Casa, NP on patient's CT Chest Lung cancer screening. IMPRESSION: 1. Lung-RADS 4A, suspicious. Follow up low-dose chest CT without contrast in 3 months (please use the following order, "CT CHEST LCS NODULE FOLLOW-UP W/O CM") is recommended. Alternatively, PET may be considered when there is a solid component 68mm or larger. 2. Emphysema and aortic atherosclerosis.  Sent message to Dr. Chryl Heck since Sonia Baller is not here today. Scheduling follow up with Dr. Raliegh Ip to discuss results with patient.

## 2020-07-27 NOTE — Patient Instructions (Signed)
S/P DCCV on 06/10/20 Continue warfarin 1 tablet daily except 1/2 tablet on Sundays and Wednesdays Recheck in 4 weeks 

## 2020-07-28 ENCOUNTER — Telehealth: Payer: Self-pay

## 2020-07-28 DIAGNOSIS — Z8546 Personal history of malignant neoplasm of prostate: Secondary | ICD-10-CM | POA: Diagnosis not present

## 2020-07-28 DIAGNOSIS — Z8551 Personal history of malignant neoplasm of bladder: Secondary | ICD-10-CM | POA: Diagnosis not present

## 2020-07-28 MED ORDER — TRELEGY ELLIPTA 100-62.5-25 MCG/INH IN AEPB
1.0000 | INHALATION_SPRAY | RESPIRATORY_TRACT | 1 refills | Status: DC
Start: 2020-07-28 — End: 2020-08-03

## 2020-07-28 NOTE — Telephone Encounter (Signed)
Sent rx to mail order per patients request and 2 samples left up front for patient pick up. Patient notified and verbalized understanding

## 2020-07-28 NOTE — Telephone Encounter (Signed)
  Prescription Request  07/28/2020  What is the name of the medication or equipment? Fluticasone-Umeclidin-Vilant 100-62.5-25 MCG . Patient was seen in October with [facility] and he didn't call into the pharmacy. Patient is out and will samples to carry through til he gets in the mail if we have  Have you contacted your pharmacy to request a refill? (if applicable) YES  Which pharmacy would you like this sent to? [facility]   Patient notified that their request is being sent to the clinical staff for review and that they should receive a response within 2 business days.

## 2020-08-03 ENCOUNTER — Telehealth: Payer: Self-pay

## 2020-08-03 MED ORDER — TRELEGY ELLIPTA 100-62.5-25 MCG/INH IN AEPB
1.0000 | INHALATION_SPRAY | RESPIRATORY_TRACT | 1 refills | Status: DC
Start: 2020-08-03 — End: 2020-08-05

## 2020-08-03 NOTE — Telephone Encounter (Signed)
Pt's spouse aware trelegy sent to [facility] as requested.

## 2020-08-05 ENCOUNTER — Telehealth: Payer: Self-pay

## 2020-08-05 MED ORDER — TRELEGY ELLIPTA 100-62.5-25 MCG/INH IN AEPB
1.0000 | INHALATION_SPRAY | RESPIRATORY_TRACT | 1 refills | Status: DC
Start: 2020-08-05 — End: 2020-08-19

## 2020-08-05 NOTE — Telephone Encounter (Signed)
Awesome! Thanks. Need anything from me?

## 2020-08-05 NOTE — Telephone Encounter (Signed)
FYI**Problem solved:  -I can get patient free Breztri (equivalent triple therapy inhaler) -Patient set up with pharmd for appt 08/13/20 -Will give Trelegy 1 month free card in the mean time  Patient and wife verbalize understanding

## 2020-08-10 ENCOUNTER — Other Ambulatory Visit: Payer: Self-pay | Admitting: Family Medicine

## 2020-08-10 DIAGNOSIS — R911 Solitary pulmonary nodule: Secondary | ICD-10-CM

## 2020-08-13 ENCOUNTER — Ambulatory Visit: Payer: Self-pay | Admitting: Pharmacist

## 2020-08-19 ENCOUNTER — Other Ambulatory Visit: Payer: Self-pay | Admitting: Family Medicine

## 2020-08-19 ENCOUNTER — Ambulatory Visit (INDEPENDENT_AMBULATORY_CARE_PROVIDER_SITE_OTHER): Payer: Medicare HMO | Admitting: Pharmacist

## 2020-08-19 DIAGNOSIS — J449 Chronic obstructive pulmonary disease, unspecified: Secondary | ICD-10-CM

## 2020-08-19 MED ORDER — BREZTRI AEROSPHERE 160-9-4.8 MCG/ACT IN AERO: 2.0000 | INHALATION_SPRAY | Freq: Two times a day (BID) | RESPIRATORY_TRACT | 11 refills | Status: DC

## 2020-08-19 NOTE — Progress Notes (Signed)
HPI Patient presents today to see pharmacy for inhaler/COPD education assistance.  Patient admits COPD symptoms have improved since introduction and use of Trelegly Ellipta.  Financial cost has prohibited him from staying on the inhaler.  Samples and trial have been given by our office.  Past medical history includes  Past Medical History:  Diagnosis Date  . Anginal pain ([place])   . Atrial fibrillation by electrocardiogram ([place]) 11/2019  . Atrial flutter by electrocardiogram ([place]) 11/2019  . Bladder cancer ([place]) 01/2020  . Bladder tumor   . BPH (benign prostatic hyperplasia)   . CAD (coronary artery disease)    a. s/p INF MI 1997;  b. s/p CABG 2001;  c. [place] 11/2009:  3v CAD, S-PDA ok with 40% mid, S-OM ok, S-Dx ok, L-LAD ok, EF 50%;  d.  Lex MV 5/14:  Inferolateral scar, EF 46%, no ischemia  . COPD (chronic obstructive pulmonary disease) ([place])   . Dyspnea   . ETOH abuse   . GERD (gastroesophageal reflux disease)   . History of gout   . Hypercholesterolemia   . Hypertension   . Myocardial infarction ([place]) 2013  . Prostate cancer ([place])   . Tobacco abuse    Number of hospitalizations in past year: Number of COPD exacerbations in past year: 0  Respiratory Medications Current: trelegy samples Tried in past: symbicort, spiriva Patient reports no known adherence challenges; financial challenges  OBJECTIVE No Known Allergies  Outpatient Encounter Medications as of 08/19/2020  Medication Sig  . acetaminophen (TYLENOL) 500 MG tablet Take 2 tablets (1,000 mg total) by mouth every 6 (six) hours as needed for moderate pain or headache.  . albuterol (VENTOLIN HFA) 108 (90 Base) MCG/ACT inhaler INHALE TWO PUFFS INTO THE LUNGS EVERY 6 HOURS AS NEEDED FOR  WHEEZING  OR  SHORTNESS  OF  BREATH (Patient taking differently: Inhale 2 puffs into the lungs every 6 (six) hours as needed for wheezing or shortness of breath. )  . allopurinol (ZYLOPRIM) 100 MG tablet TAKE 1 TABLET EVERY DAY (Patient taking  differently: Take 100 mg by mouth daily. )  . amLODipine (NORVASC) 5 MG tablet Take 1 tablet (5 mg total) by mouth daily.  Marland Kitchen atorvastatin (LIPITOR) 80 MG tablet Take 1 tablet (80 mg total) by mouth daily at 6 PM.  . Cholecalciferol (VITAMIN D3) 50 MCG (2000 UT) TABS Take 2,000 Units by mouth daily.   . ferrous sulfate 325 (65 FE) MG tablet Take 1 tablet (325 mg total) by mouth daily with breakfast.  . fluticasone (FLONASE) 50 MCG/ACT nasal spray Place 2 sprays into both nostrils daily as needed for allergies.  . Fluticasone-Umeclidin-Vilant (TRELEGY ELLIPTA) 100-62.5-25 MCG/INH AEPB Inhale 1 puff into the lungs every morning.  . metoprolol tartrate (LOPRESSOR) 25 MG tablet Take 0.5 tablets (12.5 mg total) by mouth 2 (two) times daily.  . nitroGLYCERIN (NITROSTAT) 0.4 MG SL tablet Place 1 tablet (0.4 mg total) under the tongue every 5 (five) minutes as needed for chest pain.  Marland Kitchen omeprazole (PRILOSEC) 20 MG capsule Take 1 capsule (20 mg total) by mouth daily.  . tamsulosin (FLOMAX) 0.4 MG CAPS capsule TAKE 1 CAPSULE (0.4 MG TOTAL) BY MOUTH DAILY.  Marland Kitchen warfarin (COUMADIN) 5 MG tablet TAKE 1 TABLET EVERY DAY   No facility-administered encounter medications on file as of 08/19/2020.     Immunization History  Administered Date(s) Administered  . Fluad Quad(high Dose 65+) 05/12/2019, 05/26/2020  . Influenza, High Dose Seasonal PF 06/25/2018  . Influenza,inj,Quad PF,6+ Mos  05/08/2016, 07/23/2017  . Moderna Sars-Covid-2 Vaccination 11/06/2019, 12/04/2019, 07/20/2020  . Pneumococcal Conjugate-13 01/19/2017  . Pneumococcal Polysaccharide-23 11/27/2012  . Tdap 05/26/2020     PFTs No flowsheet data found.   Eosinophils Most recent blood eosinophil count was 2 cells/microL taken on 05/26/20.   IgE   Assessment   1. Inhaler Optimization  Optimal inhaler for patient would be Breztri considering patient needs triple therapy & patient assistance program accessible for this patient ([place] for  [place]).  Patient was counseled on the purpose, proper use, and adverse effects of Breztri inhaler.  Instructed patient to rinse mouth with water after using in order to prevent fungal infection.  Patient verbalized understanding.  Reviewed appropriate use of maintenance vs rescue inhalers.  Stressed importance of using maintenance inhaler daily and rescue inhaler only as needed.  Patient verbalized understanding.  Patient was given sample in office today.    2. Medication Reconciliation  A drug regimen assessment was performed, including review of allergies, interactions, disease-state management, dosing and immunization history. Medications were reviewed with the patient, including name, instructions, indication, goals of therapy, potential side effects, importance of adherence, and safe use.  Drug interaction(s): none  3. Immunizations  Patient is indicated for the influenzae, pneumonia, and shingles vaccinations.  PLAN Continue Trelegy samples until finished; then switch to [employer]   Switch to breztri inhaler based on patient assistance program availability  Instructed patient to inhale 2 puffs twice daily; rinse mouth after  Enrolled in [place] patient assistance program for Breztri for 2841  Application filled out online  Medication will ship to patient's home (3 month supply)  RX escribed to [facility] with 3 refills ([facility] is patient assistance pharmacy for AZ&Me patient assistance program)  Patient must call [facility] for refills; number on prescription box  All questions encouraged and answered.  Instructed patient to reach out with any further questions or concerns.  Thank you for allowing pharmacy to participate in this patient's care.  This appointment required 20 minutes of patient care (this includes precharting, chart review, review of results, , etc.).  Regina Eck, PharmD, BCPS Clinical Pharmacist, [place] [facility]  II Phone [phone]

## 2020-08-24 ENCOUNTER — Encounter: Payer: Self-pay | Admitting: *Deleted

## 2020-08-25 ENCOUNTER — Ambulatory Visit (INDEPENDENT_AMBULATORY_CARE_PROVIDER_SITE_OTHER): Payer: Medicare HMO | Admitting: *Deleted

## 2020-08-25 DIAGNOSIS — Z5181 Encounter for therapeutic drug level monitoring: Secondary | ICD-10-CM | POA: Diagnosis not present

## 2020-08-25 DIAGNOSIS — I4891 Unspecified atrial fibrillation: Secondary | ICD-10-CM | POA: Diagnosis not present

## 2020-08-25 LAB — POCT INR: INR: 2.5 (ref 2.0–3.0)

## 2020-08-25 NOTE — Patient Instructions (Signed)
S/P DCCV on 06/10/20 Continue warfarin 1 tablet daily except 1/2 tablet on Sundays and Wednesdays Recheck in 4 weeks 

## 2020-08-30 MED ORDER — BREZTRI AEROSPHERE 160-9-4.8 MCG/ACT IN AERO: 2.0000 | INHALATION_SPRAY | Freq: Two times a day (BID) | RESPIRATORY_TRACT | 4 refills | Status: DC

## 2020-08-30 NOTE — Addendum Note (Signed)
Addended by: Lottie Dawson D on: 08/30/2020 05:48 PM   Modules accepted: Orders

## 2020-09-03 ENCOUNTER — Institutional Professional Consult (permissible substitution): Payer: Medicare HMO | Admitting: Emergency Medicine

## 2020-09-07 ENCOUNTER — Other Ambulatory Visit: Payer: Self-pay

## 2020-09-07 ENCOUNTER — Encounter: Payer: Self-pay | Admitting: Emergency Medicine

## 2020-09-07 ENCOUNTER — Ambulatory Visit: Payer: Medicare HMO | Admitting: Emergency Medicine

## 2020-09-07 DIAGNOSIS — F17200 Nicotine dependence, unspecified, uncomplicated: Secondary | ICD-10-CM | POA: Diagnosis not present

## 2020-09-07 DIAGNOSIS — J449 Chronic obstructive pulmonary disease, unspecified: Secondary | ICD-10-CM

## 2020-09-07 DIAGNOSIS — R918 Other nonspecific abnormal finding of lung field: Secondary | ICD-10-CM | POA: Insufficient documentation

## 2020-09-07 NOTE — Addendum Note (Signed)
Addended by: Dierdre Highman on: 09/07/2020 04:33 PM   Modules accepted: Orders

## 2020-09-07 NOTE — Assessment & Plan Note (Addendum)
He has been managed on Trelegy but this is no longer cost effective, planning to change over to [place] and he has financial support for this through [place] with making this change.  He uses albuterol at least once a day.  Plan continue as needed.  We will obtain pulmonary function testing at his next office visit to quantify his degree of obstruction.

## 2020-09-07 NOTE — Patient Instructions (Signed)
Agree with changing your Trelegy over to [facility] 2 puffs twice a day when this is available. Keep your albuterol available to use 2 puffs when you need if shortness of breath, chest tightness, wheezing. We will repeat your CT scan of the chest in late March 2022 to follow your pulmonary nodule for stability. Follow with Dr. Lamonte Sakai in March after your CT scan with full pulmonary function testing on the same day. Work hard on decreasing your smoking.  Our overall goal will be to try to stop altogether.

## 2020-09-07 NOTE — Progress Notes (Signed)
Subjective:    [name], male    DOB: 09/29/1945, 75 y.o.   MRN: [id number]  HPI 75 year old smoker (60+ pack years, 6 cig a day) with a history of CAD/CABG, hypertension, atrial fibrillation/flutter (cardioverted 06/2020), bladder cancer (Dr Jeffie Pollock), prostate cancer (Dr Jeffie Pollock), GERD.  He has a history of COPD that was diagnosed in .  No PFT available.  He is on Trelegy in the past, about to change to [place]. Never needs his albuterol. Has am cough, has rare wheeze. He tries to stay active, but is limited by dyspnea.   He is referred today for an abnormal CT chest.  He underwent lung cancer screening CT on 07/26/2020 that I have reviewed, showed a new 6.8 mm right apical pulmonary nodule (from 05/28/2019), stable centrilobular and paraseptal emphysema and stable previously noted pulmonary nodules.   Review of Systems As per HPI  Past Medical History:  Diagnosis Date  . Anginal pain ([place])   . Atrial fibrillation by electrocardiogram ([place]) 11/2019  . Atrial flutter by electrocardiogram ([place]) 11/2019  . Bladder cancer ([place]) 01/2020  . Bladder tumor   . BPH (benign prostatic hyperplasia)   . CAD (coronary artery disease)    a. s/p INF MI 1997;  b. s/p CABG 2001;  c. [place] 11/2009:  3v CAD, S-PDA ok with 40% mid, S-OM ok, S-Dx ok, L-LAD ok, EF 50%;  d.  Lex MV 5/14:  Inferolateral scar, EF 46%, no ischemia  . COPD (chronic obstructive pulmonary disease) ([place])   . Dyspnea   . ETOH abuse   . GERD (gastroesophageal reflux disease)   . History of gout   . Hypercholesterolemia   . Hypertension   . Myocardial infarction ([place]) 2013  . Prostate cancer ([place])   . Tobacco abuse      Family History  Problem Relation Age of Onset  . Diabetes Mother   . Hypertension Father   . Heart disease Brother   . Diabetes Brother   . Heart disease Brother   . Diabetes Brother   . Multiple sclerosis Daughter   . Healthy Son   . Colon cancer Neg Hx     No family hx lung CA.   Social  History   Socioeconomic History  . Marital status: Married    Spouse name: Not on file  . Number of children: Not on file  . Years of education: Not on file  . Highest education level: Not on file  Occupational History  . Not on file  Tobacco Use  . Smoking status: Current Every Day Smoker    Packs/day: 0.50    Years: 57.00    Pack years: 28.50    Types: Cigarettes, E-cigarettes    Start date: 03/08/1970  . Smokeless tobacco: Former Systems developer    Types: Snuff    Quit date: 03/08/2012  . Tobacco comment: 6-7 cigarettes smoked per day and vaping as well ARJ 09/07/20  Vaping Use  . Vaping Use: Every day  Substance and Sexual Activity  . Alcohol use: Yes    Alcohol/week: 6.0 standard drinks    Types: 6 Cans of beer per week    Comment: daily   . Drug use: No  . Sexual activity: Yes    Partners: Female  Other Topics Concern  . Not on file  Social History Narrative  . 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 acetaminophen (TYLENOL) 500 MG tablet Take 2 tablets (1,000 mg total) by mouth every 6 (six) hours as needed for moderate pain or headache. 90 tablet 0  . albuterol (VENTOLIN HFA) 108 (90 Base) MCG/ACT inhaler INHALE TWO PUFFS INTO THE LUNGS EVERY 6 HOURS AS NEEDED FOR  WHEEZING  OR  SHORTNESS  OF  BREATH (Patient taking differently: Inhale 2 puffs into the lungs every 6 (six) hours as needed for wheezing or shortness of breath.) 54 g 3  . allopurinol (ZYLOPRIM) 100 MG tablet TAKE 1 TABLET EVERY DAY (Patient taking differently: Take 100 mg by mouth daily.) 90 tablet 1  .  amLODipine (NORVASC) 5 MG tablet Take 1 tablet (5 mg total) by mouth daily. 90 tablet 2  . atorvastatin (LIPITOR) 80 MG tablet Take 1 tablet (80 mg total) by mouth daily at 6 PM. 90 tablet 2  . Cholecalciferol (VITAMIN D3) 50 MCG (2000 UT) TABS Take 2,000 Units by mouth daily.     . ferrous sulfate 325 (65 FE) MG tablet Take 1 tablet (325 mg total) by mouth daily with breakfast. 30 tablet 5  . fluticasone (FLONASE) 50 MCG/ACT nasal spray Place 2 sprays into both nostrils daily as needed for allergies. 48 g 2  . metoprolol tartrate (LOPRESSOR) 25 MG tablet Take 0.5 tablets (12.5 mg total) by mouth 2 (two) times daily. 90 tablet 2  . nitroGLYCERIN (NITROSTAT) 0.4 MG SL tablet Place 1 tablet (0.4 mg total) under the tongue every 5 (five) minutes as needed for chest pain. 75 tablet 2  . omeprazole (PRILOSEC) 20 MG capsule Take 1 capsule (20 mg total) by mouth daily. 90 capsule 2  . warfarin (COUMADIN) 5 MG tablet TAKE 1 TABLET EVERY DAY 90 tablet 0  . Budeson-Glycopyrrol-Formoterol (BREZTRI AEROSPHERE) 160-9-4.8 MCG/ACT AERO Take 2 puffs by mouth in the morning and at bedtime. Dispense 3 inhalers (Patient not taking: Reported on 09/07/2020) 3 g 4  . tamsulosin (FLOMAX) 0.4 MG CAPS capsule TAKE 1 CAPSULE (0.4 MG TOTAL) BY MOUTH DAILY. (Patient not taking: Reported on 09/07/2020) 90 capsule 1   No facility-administered medications prior to visit.         Objective:   Physical Exam  Vitals:   09/07/20 1506  BP: 134/76  Pulse: 62  Temp: 98.1 F (36.7 C)  SpO2: 98%  Weight: 151 lb 3.2 oz (68.6 kg)  Height: 5\' 6"  (1.676 m)   Gen: Pleasant, well-nourished, in no distress,  normal affect  ENT: No lesions,  mouth clear,  oropharynx clear, no postnasal drip  Neck: No JVD, no stridor  Lungs: No use of accessory muscles, no crackles or wheezing on normal respiration, no wheeze on forced expiration  Cardiovascular: RRR, heart sounds normal, no murmur or gallops, no peripheral  edema  Musculoskeletal: No deformities, no cyanosis or clubbing  Neuro: alert, awake, non focal  Skin: Warm, no lesions or rash     Assessment & Plan:  COPD (chronic obstructive pulmonary disease) ([place]) He has been managed on Trelegy but this is no longer cost effective, planning to change over to [place] and he has financial support for this through [place].  Agree with making this change.  He uses albuterol at least once a day.  Plan continue as needed.  We will obtain pulmonary function testing at his next office visit to quantify his degree of obstruction.  TOBACCO USER Discussed cessation goals with him today.  Pulmonary nodules New suspicious 6.8 mm pulmonary nodule on his screening CT scan (new from 1 year ago).  He is high risk given his smoking history, history of bladder and prostate cancer.  I will repeat his CT chest and March to look for interval stability.  If it is changing in size then will discuss diagnostic strategies, either navigational bronchoscopy or possibly referral for primary resection depending on his pulmonary function testing and overall functional capacity.   Baltazar Apo, MD, PhD 09/07/2020, 3:37 PM [facility] [phone] or if no answer [phone]

## 2020-09-07 NOTE — Assessment & Plan Note (Signed)
Discussed cessation goals with him today.

## 2020-09-07 NOTE — Assessment & Plan Note (Signed)
New suspicious 6.8 mm pulmonary nodule on his screening CT scan (new from 1 year ago).  He is high risk given his smoking history, history of bladder and prostate cancer.  I will repeat his CT chest and March to look for interval stability.  If it is changing in size then will discuss diagnostic strategies, either navigational bronchoscopy or possibly referral for primary resection depending on his pulmonary function testing and overall functional capacity.

## 2020-09-08 ENCOUNTER — Telehealth: Payer: Self-pay | Admitting: Emergency Medicine

## 2020-09-09 NOTE — Telephone Encounter (Signed)
PCCs, please advise. Looks like she is calling Vallarie Mare back about CT chest.

## 2020-09-09 NOTE — Telephone Encounter (Signed)
I took care of this yesterday

## 2020-09-22 ENCOUNTER — Ambulatory Visit (INDEPENDENT_AMBULATORY_CARE_PROVIDER_SITE_OTHER): Payer: Medicare HMO | Admitting: *Deleted

## 2020-09-22 DIAGNOSIS — I4891 Unspecified atrial fibrillation: Secondary | ICD-10-CM | POA: Diagnosis not present

## 2020-09-22 DIAGNOSIS — Z5181 Encounter for therapeutic drug level monitoring: Secondary | ICD-10-CM | POA: Diagnosis not present

## 2020-09-22 LAB — POCT INR: INR: 2.6 (ref 2.0–3.0)

## 2020-09-22 NOTE — Patient Instructions (Signed)
S/P DCCV on 06/10/20 Continue warfarin 1 tablet daily except 1/2 tablet on Sundays and Wednesdays Recheck in 4 weeks

## 2020-10-19 ENCOUNTER — Ambulatory Visit (HOSPITAL_COMMUNITY)
Admission: RE | Admit: 2020-10-19 | Discharge: 2020-10-19 | Disposition: A | Discharge status: Home / Self Care | Payer: Medicare HMO | Source: Ambulatory Visit | Attending: Emergency Medicine | Admitting: Emergency Medicine

## 2020-10-19 ENCOUNTER — Other Ambulatory Visit: Payer: Self-pay

## 2020-10-19 DIAGNOSIS — R918 Other nonspecific abnormal finding of lung field: Secondary | ICD-10-CM | POA: Insufficient documentation

## 2020-10-19 DIAGNOSIS — I252 Old myocardial infarction: Secondary | ICD-10-CM | POA: Diagnosis not present

## 2020-10-19 DIAGNOSIS — J984 Other disorders of lung: Secondary | ICD-10-CM | POA: Diagnosis not present

## 2020-10-19 DIAGNOSIS — J8410 Pulmonary fibrosis, unspecified: Secondary | ICD-10-CM | POA: Diagnosis not present

## 2020-10-19 DIAGNOSIS — J432 Centrilobular emphysema: Secondary | ICD-10-CM | POA: Diagnosis not present

## 2020-10-20 ENCOUNTER — Ambulatory Visit (INDEPENDENT_AMBULATORY_CARE_PROVIDER_SITE_OTHER): Payer: Medicare HMO | Admitting: *Deleted

## 2020-10-20 DIAGNOSIS — I4891 Unspecified atrial fibrillation: Secondary | ICD-10-CM

## 2020-10-20 DIAGNOSIS — Z5181 Encounter for therapeutic drug level monitoring: Secondary | ICD-10-CM

## 2020-10-20 LAB — POCT INR: INR: 6.8 — AB (ref 2.0–3.0)

## 2020-10-20 NOTE — Patient Instructions (Addendum)
S/P DCCV on 06/10/20 Hold warfarin x 4 days (Wed - Sat) Sunday take 1/2 tablet Recheck on Monday Bleeding and fall precautions discussed with pt and he verbalized understanding

## 2020-10-25 ENCOUNTER — Ambulatory Visit (HOSPITAL_COMMUNITY): Payer: Medicare HMO

## 2020-10-25 ENCOUNTER — Ambulatory Visit (INDEPENDENT_AMBULATORY_CARE_PROVIDER_SITE_OTHER): Payer: Medicare HMO | Admitting: *Deleted

## 2020-10-25 DIAGNOSIS — Z5181 Encounter for therapeutic drug level monitoring: Secondary | ICD-10-CM

## 2020-10-25 DIAGNOSIS — I4891 Unspecified atrial fibrillation: Secondary | ICD-10-CM | POA: Diagnosis not present

## 2020-10-25 LAB — POCT INR: INR: 1.2 — AB (ref 2.0–3.0)

## 2020-10-25 NOTE — Patient Instructions (Signed)
Restart warfarin 1 tablet daily except 1/2 tablet on Sundays and Wednesdays Recheck in 1 wk

## 2020-10-26 ENCOUNTER — Ambulatory Visit: Payer: Medicare HMO | Admitting: Adult Health

## 2020-10-26 ENCOUNTER — Encounter: Payer: Self-pay | Admitting: Adult Health

## 2020-10-26 ENCOUNTER — Other Ambulatory Visit: Payer: Self-pay

## 2020-10-26 DIAGNOSIS — F17200 Nicotine dependence, unspecified, uncomplicated: Secondary | ICD-10-CM

## 2020-10-26 DIAGNOSIS — R918 Other nonspecific abnormal finding of lung field: Secondary | ICD-10-CM

## 2020-10-26 DIAGNOSIS — J449 Chronic obstructive pulmonary disease, unspecified: Secondary | ICD-10-CM | POA: Diagnosis not present

## 2020-10-26 NOTE — Assessment & Plan Note (Signed)
Smoking cessation discussed 

## 2020-10-26 NOTE — Patient Instructions (Signed)
Continue on BREZTRI 2 puffs Twice daily  , rinse after use.  Activity as tolerated.  Work on not smoking  CT chest in 6 months (no contrast) -Follow lung nodule on right .  Follow up with Dr. Lamonte Sakai  Next month with PFT as planned and As needed

## 2020-10-26 NOTE — Assessment & Plan Note (Signed)
New apical lung nodule measuring 6.8 mm first noted in December 2021.  CT scan March 2022 shows stability at 6 mm.  We will follow-up CT in 6 months.

## 2020-10-26 NOTE — Addendum Note (Signed)
Addended by: Trudi Ida on: 10/26/2020 05:12 PM   Modules accepted: Orders

## 2020-10-26 NOTE — Assessment & Plan Note (Signed)
Currently compensated need PFTs on return  Plan  Patient Instructions  Continue on BREZTRI 2 puffs Twice daily  , rinse after use.  Activity as tolerated.  Work on not smoking  CT chest in 6 months (no contrast) -Follow lung nodule on right .  Follow up with Dr. Lamonte Sakai  Next month with PFT as planned and As needed      '

## 2020-10-26 NOTE — Progress Notes (Signed)
@Patient  ID: [name], male    DOB: [birth year], 75 y.o.   MRN: [id number]  Chief Complaint  Patient presents with   Follow-up    Referring provider: [name], MD  HPI: 75 year old male active smoker (65-pack-year) seen for pulmonary consult September 07, 2020 for abnormal CT chest. Medical history significant for COPD, coronary artery disease status post CABG, A. fib/flutter, bladder cancer  TEST/EVENTS :  Lung cancer screening CT done July 26, 2020 showed a new 6.8 mm right apical pulmonary nodule, stable emphysema.   10/26/2020 Follow up : Lung n patient presents for a 1 month follow-up.  Patient was seen last visit for a pulmonary consult.  Lung cancer screening CT done in December 2021 showed a new 6.8 mm right apical pulmonary nodule.odule and COPD  Patient is a active smoker.  Carries a diagnosis of COPD.  Patient was set up for a follow-up CT that was completed October 19, 2020 that showed stability of the right apical 6 mm pulmonary nodule.  No thoracic adenopathy.  reviewed his CT scan results with patient and wife.  Patient denies any hemoptysis or unintentional weight loss We discussed that a CT will follow-up in 6 months.  Patient does carry a diagnosis of COPD.  He is currently on BREZTRI .  Denies any flare of cough or wheezing.  Says he does get short of breath with activity.  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  Patient does smoke cigarettes about 6 a day and uses a vape.  Smoking cessation was discussed.  Patient has not had PFTs in several years.  Previous PFT results are unavailable.    No Known Allergies  Immunization History  Administered Date(s) Administered   Fluad Quad(high Dose 65+) 05/12/2019, 05/26/2020   Influenza, High Dose Seasonal PF 06/25/2018   Influenza,inj,Quad PF,6+ Mos 05/08/2016, 07/23/2017   Moderna Sars-Covid-2 Vaccination 11/06/2019, 12/04/2019, 07/20/2020   Pneumococcal Conjugate-13 01/19/2017    Pneumococcal Polysaccharide-23 11/27/2012   Tdap 05/26/2020    Past Medical History:  Diagnosis Date   Anginal pain ([place])    Atrial fibrillation by electrocardiogram ([place]) 11/2019   Atrial flutter by electrocardiogram ([place]) 11/2019   Bladder cancer ([place]) 01/2020   Bladder tumor    BPH (benign prostatic hyperplasia)    CAD (coronary artery disease)    a. s/p INF MI 1997;  b. s/p CABG 2001;  c. [place] 11/2009:  3v CAD, S-PDA ok with 40% mid, S-OM ok, S-Dx ok, L-LAD ok, EF 50%;  d.  [place] [facility] 5/14:  Inferolateral scar, EF 46%, no ischemia   COPD (chronic obstructive pulmonary disease) (HCC)    Dyspnea    ETOH abuse    GERD (gastroesophageal reflux disease)    History of gout    Hypercholesterolemia    Hypertension    Myocardial infarction [facility]) 2013   Prostate cancer ([place])    Tobacco abuse     Tobacco History: Social History   Tobacco Use  Smoking Status Current Every Day Smoker   Packs/day: 0.50   Years: 57.00   Pack years: 28.50   Types: Cigarettes, E-cigarettes   Start date: 03/08/1970  Smokeless Tobacco Former User   Types: Snuff   Quit date: 03/08/2012  Tobacco Comment   5-6 cigarettes smoked per day and vaping as well [facility] 10/26/20   Ready to quit: Not Answered Counseling given: Not Answered Comment: 5-6 cigarettes smoked per day and vaping as well [facility] 10/26/20   Outpatient  Medications Prior to Visit  Medication Sig Dispense Refill   acetaminophen (TYLENOL) 500 MG tablet Take 2 tablets (1,000 mg total) by mouth every 6 (six) hours as needed for moderate pain or headache. 90 tablet 0   albuterol (VENTOLIN HFA) 108 (90 Base) MCG/ACT inhaler INHALE TWO PUFFS INTO THE LUNGS EVERY 6 HOURS AS NEEDED FOR  WHEEZING  OR  SHORTNESS  OF  BREATH (Patient taking differently: Inhale 2 puffs into the lungs every 6 (six) hours as needed for wheezing or shortness of breath.) 54 g 3   allopurinol (ZYLOPRIM) 100 MG tablet TAKE 1 TABLET EVERY DAY (Patient taking  differently: Take 100 mg by mouth daily.) 90 tablet 1   amLODipine (NORVASC) 5 MG tablet Take 1 tablet (5 mg total) by mouth daily. 90 tablet 2   atorvastatin (LIPITOR) 80 MG tablet Take 1 tablet (80 mg total) by mouth daily at 6 PM. 90 tablet 2   Budeson-Glycopyrrol-Formoterol (BREZTRI AEROSPHERE) 160-9-4.8 MCG/ACT AERO Take 2 puffs by mouth in the morning and at bedtime. Dispense 3 inhalers 3 g 4   Cholecalciferol (VITAMIN D3) 50 MCG (2000 UT) TABS Take 2,000 Units by mouth daily.      ferrous sulfate 325 (65 FE) MG tablet Take 1 tablet (325 mg total) by mouth daily with breakfast. 30 tablet 5   fluticasone (FLONASE) 50 MCG/ACT nasal spray Place 2 sprays into both nostrils daily as needed for allergies. 48 g 2   metoprolol tartrate (LOPRESSOR) 25 MG tablet Take 0.5 tablets (12.5 mg total) by mouth 2 (two) times daily. 90 tablet 2   nitroGLYCERIN (NITROSTAT) 0.4 MG SL tablet Place 1 tablet (0.4 mg total) under the tongue every 5 (five) minutes as needed for chest pain. 75 tablet 2   omeprazole (PRILOSEC) 20 MG capsule Take 1 capsule (20 mg total) by mouth daily. 90 capsule 2   tamsulosin (FLOMAX) 0.4 MG CAPS capsule TAKE 1 CAPSULE (0.4 MG TOTAL) BY MOUTH DAILY. 90 capsule 1   warfarin (COUMADIN) 5 MG tablet TAKE 1 TABLET EVERY DAY 90 tablet 0   No facility-administered medications prior to visit.     Review of Systems:   Constitutional:   No  weight loss, night sweats,  Fevers, chills,  +fatigue, or  lassitude.  HEENT:   No headaches,  Difficulty swallowing,  Tooth/dental problems, or  Sore throat,                No sneezing, itching, ear ache, nasal congestion, post nasal drip,   CV:  No chest pain,  Orthopnea, PND, swelling in lower extremities, anasarca, dizziness, palpitations, syncope.   GI  No heartburn, indigestion, abdominal pain, nausea, vomiting, diarrhea, change in bowel habits, loss of appetite, bloody stools.   Resp   No excess mucus, no productive cough,  No  non-productive cough,  No coughing up of blood.  No change in color of mucus.  No wheezing.  No chest wall deformity  Skin: no rash or lesions.  GU: no dysuria, change in color of urine, no urgency or frequency.  No flank pain, no hematuria   MS:  No joint pain or swelling.  No decreased range of motion.  No back pain.    Physical Exam  BP 135/62 (BP Location: Left Arm, Patient Position: Sitting, Cuff Size: Normal)    Pulse 89    Temp 98 F (36.7 C) (Skin)    Ht 5\' 7"  (1.702 m)    Wt 153 lb 12.8 oz (69.8 kg)  SpO2 96%    BMI 24.09 kg/m   GEN: A/Ox3; pleasant , NAD, thin male   HEENT:  [place]/AT,    NOSE-clear, THROAT-clear, no lesions, no postnasal drip or exudate noted.   NECK:  Supple w/ fair ROM; no JVD; normal carotid impulses w/o bruits; no thyromegaly or nodules palpated; no lymphadenopathy.    RESP  Clear  P & A; w/o, wheezes/ rales/ or rhonchi. no accessory muscle use, no dullness to percussion  CARD:  RRR, no m/r/g, no peripheral edema, pulses intact, no cyanosis or clubbing.  GI:   Soft & nt; nml bowel sounds; no organomegaly or masses detected.   Musco: Warm bil, no deformities or joint swelling noted.   Neuro: alert, no focal deficits noted.    Skin: Warm, no lesions or rashes    Lab Results:  CBC  BMET  BNP No results found for: BNP  ProBNP No results found for: PROBNP  Imaging: CT Super D Chest Wo Contrast  Result Date: 10/19/2020 CLINICAL DATA:  Follow-up of pulmonary nodule.  Possible preop. EXAM: CT CHEST WITHOUT CONTRAST TECHNIQUE: Multidetector CT imaging of the chest was performed using thin slice collimation for electromagnetic bronchoscopy planning purposes, without intravenous contrast. COMPARISON:  07/26/2020 screening CT. FINDINGS: Cardiovascular: Aortic atherosclerosis. Tortuous thoracic aorta. Mild cardiomegaly with prior left lateral ventricular free wall infarct. Median sternotomy for CABG. Mediastinum/Nodes: No mediastinal or definite  hilar adenopathy, given limitations of unenhanced CT. Lungs/Pleura: No pleural fluid. Mild to moderate centrilobular emphysema. Biapical pleuroparenchymal scarring. Anteromedial right apical 6 mm nodule on 23/4 is similar to on the prior exam. Calcified right lower lobe granuloma. Scattered noncalcified 1-2 mm subpleural pulmonary nodules are nonspecific, including in the right upper lobe on 52/4. Upper Abdomen: Normal imaged portions of the liver, spleen, stomach, pancreas, gallbladder, adrenal glands, kidneys. Musculoskeletal: Lower thoracic and upper cervical spondylosis. Mild superior endplate irregularity at T3 and T9. IMPRESSION: 1. Nearly 4 months stability of the right apical 6 mm pulmonary nodule. 2. No thoracic adenopathy. 3. Aortic atherosclerosis (ICD10-I70.0) and emphysema (ICD10-J43.9). Electronically Signed   By: Abigail Miyamoto M.D.   On: 10/19/2020 15:46      No flowsheet data found.  No results found for: NITRICOXIDE      Assessment & Plan:   COPD (chronic obstructive pulmonary disease) (HCC) Currently compensated need PFTs on return  Plan  Patient Instructions  Continue on BREZTRI 2 puffs Twice daily  , rinse after use.  Activity as tolerated.  Work on not smoking  CT chest in 6 months (no contrast) -Follow lung nodule on right .  Follow up with Dr. Lamonte Sakai  Next month with PFT as planned and As needed      '  Pulmonary nodules New apical lung nodule measuring 6.8 mm first noted in December 2021.  CT scan March 2022 shows stability at 6 mm.  We will follow-up CT in 6 months.   TOBACCO USER Smoking cessation discussed.     Rexene Edison, NP 10/26/2020

## 2020-10-27 ENCOUNTER — Other Ambulatory Visit: Payer: Self-pay | Admitting: Family Medicine

## 2020-10-27 DIAGNOSIS — I10 Essential (primary) hypertension: Secondary | ICD-10-CM

## 2020-10-31 ENCOUNTER — Other Ambulatory Visit: Payer: Self-pay | Admitting: Family Medicine

## 2020-10-31 DIAGNOSIS — I2510 Atherosclerotic heart disease of native coronary artery without angina pectoris: Secondary | ICD-10-CM

## 2020-10-31 DIAGNOSIS — E785 Hyperlipidemia, unspecified: Secondary | ICD-10-CM

## 2020-10-31 DIAGNOSIS — Z8739 Personal history of other diseases of the musculoskeletal system and connective tissue: Secondary | ICD-10-CM

## 2020-11-02 ENCOUNTER — Ambulatory Visit (INDEPENDENT_AMBULATORY_CARE_PROVIDER_SITE_OTHER): Payer: Medicare HMO | Admitting: *Deleted

## 2020-11-02 DIAGNOSIS — I4891 Unspecified atrial fibrillation: Secondary | ICD-10-CM | POA: Diagnosis not present

## 2020-11-02 DIAGNOSIS — Z5181 Encounter for therapeutic drug level monitoring: Secondary | ICD-10-CM | POA: Diagnosis not present

## 2020-11-02 LAB — POCT INR: INR: 2.3 (ref 2.0–3.0)

## 2020-11-02 NOTE — Patient Instructions (Signed)
Continue warfarin 1 tablet daily except 1/2 tablet on Sundays and Wednesdays Recheck in 3 wks

## 2020-11-04 ENCOUNTER — Other Ambulatory Visit: Payer: Self-pay | Admitting: Family Medicine

## 2020-11-23 ENCOUNTER — Ambulatory Visit (INDEPENDENT_AMBULATORY_CARE_PROVIDER_SITE_OTHER): Payer: Medicare HMO | Admitting: *Deleted

## 2020-11-23 DIAGNOSIS — I4891 Unspecified atrial fibrillation: Secondary | ICD-10-CM | POA: Diagnosis not present

## 2020-11-23 DIAGNOSIS — Z5181 Encounter for therapeutic drug level monitoring: Secondary | ICD-10-CM | POA: Diagnosis not present

## 2020-11-23 LAB — POCT INR: INR: 3.3 — AB (ref 2.0–3.0)

## 2020-11-23 NOTE — Patient Instructions (Signed)
Hold warfarin tonight then resume 1 tablet daily except 1/2 tablet on Sundays and Wednesdays Recheck in 3 wks

## 2020-11-24 ENCOUNTER — Ambulatory Visit (INDEPENDENT_AMBULATORY_CARE_PROVIDER_SITE_OTHER): Payer: Medicare HMO | Admitting: Family Medicine

## 2020-11-24 ENCOUNTER — Other Ambulatory Visit: Payer: Self-pay

## 2020-11-24 ENCOUNTER — Encounter: Payer: Self-pay | Admitting: Family Medicine

## 2020-11-24 VITALS — BP 115/61 | HR 59 | Temp 97.6°F | Ht 67.0 in | Wt 152.6 lb

## 2020-11-24 DIAGNOSIS — M109 Gout, unspecified: Secondary | ICD-10-CM | POA: Diagnosis not present

## 2020-11-24 DIAGNOSIS — I10 Essential (primary) hypertension: Secondary | ICD-10-CM | POA: Diagnosis not present

## 2020-11-24 DIAGNOSIS — I25118 Atherosclerotic heart disease of native coronary artery with other forms of angina pectoris: Secondary | ICD-10-CM

## 2020-11-24 DIAGNOSIS — E785 Hyperlipidemia, unspecified: Secondary | ICD-10-CM | POA: Diagnosis not present

## 2020-11-24 DIAGNOSIS — M4712 Other spondylosis with myelopathy, cervical region: Secondary | ICD-10-CM | POA: Diagnosis not present

## 2020-11-24 DIAGNOSIS — K219 Gastro-esophageal reflux disease without esophagitis: Secondary | ICD-10-CM | POA: Diagnosis not present

## 2020-11-24 DIAGNOSIS — J449 Chronic obstructive pulmonary disease, unspecified: Secondary | ICD-10-CM | POA: Diagnosis not present

## 2020-11-24 DIAGNOSIS — R6889 Other general symptoms and signs: Secondary | ICD-10-CM | POA: Diagnosis not present

## 2020-11-24 DIAGNOSIS — Z8739 Personal history of other diseases of the musculoskeletal system and connective tissue: Secondary | ICD-10-CM

## 2020-11-24 DIAGNOSIS — M4722 Other spondylosis with radiculopathy, cervical region: Secondary | ICD-10-CM

## 2020-11-24 DIAGNOSIS — I4891 Unspecified atrial fibrillation: Secondary | ICD-10-CM | POA: Diagnosis not present

## 2020-11-24 DIAGNOSIS — N400 Enlarged prostate without lower urinary tract symptoms: Secondary | ICD-10-CM

## 2020-11-24 DIAGNOSIS — I2510 Atherosclerotic heart disease of native coronary artery without angina pectoris: Secondary | ICD-10-CM

## 2020-11-24 MED ORDER — AMLODIPINE BESYLATE 5 MG PO TABS: 1.0000 | ORAL_TABLET | Freq: Every day | ORAL | 1 refills | Status: DC

## 2020-11-24 MED ORDER — NITROGLYCERIN 0.4 MG SL SUBL: 0.4000 mg | SUBLINGUAL_TABLET | SUBLINGUAL | 2 refills | Status: DC | PRN

## 2020-11-24 MED ORDER — ATORVASTATIN CALCIUM 80 MG PO TABS: ORAL_TABLET | ORAL | 1 refills | Status: DC

## 2020-11-24 MED ORDER — FLUTICASONE PROPIONATE 50 MCG/ACT NA SUSP: 2.0000 | Freq: Every day | NASAL | 2 refills | Status: DC | PRN

## 2020-11-24 MED ORDER — ALBUTEROL SULFATE HFA 108 (90 BASE) MCG/ACT IN AERS: 2.0000 | INHALATION_SPRAY | Freq: Four times a day (QID) | RESPIRATORY_TRACT | 3 refills | Status: DC | PRN

## 2020-11-24 MED ORDER — ALLOPURINOL 100 MG PO TABS: 100.0000 mg | ORAL_TABLET | Freq: Every day | ORAL | 1 refills | Status: DC

## 2020-11-24 MED ORDER — METOPROLOL TARTRATE 25 MG PO TABS: 12.5000 mg | ORAL_TABLET | Freq: Two times a day (BID) | ORAL | 1 refills | Status: DC

## 2020-11-24 MED ORDER — OMEPRAZOLE 20 MG PO CPDR
1.0000 | DELAYED_RELEASE_CAPSULE | Freq: Every day | ORAL | 1 refills | Status: DC
Start: 2020-11-24 — End: 2021-04-04

## 2020-11-24 NOTE — Progress Notes (Signed)
Subjective:  [name], male    DOB: 1945/12/26  Age: 75 y.o. MRN: [id number]  CC: Medical Management of Chronic Issues   HPI PIUS BYROM presents for follow-up of elevated cholesterol. Doing well without complaints on current medication. Denies side effects of statin including myalgia and arthralgia and nausea. Also in today for liver function testing. Currently no chest pain, shortness of breath or other cardiovascular related symptoms noted.   presents for  follow-up of hypertension. Patient has no history of headache chest pain or shortness of breath or recent cough. Patient also denies symptoms of TIA such as focal numbness or weakness. Patient denies side effects from medication. States taking it regularly.  Patient in for follow-up of GERD. Currently asymptomatic taking  PPI daily. There is no chest pain or heartburn. No hematemesis and no melena. No dysphagia or choking. Onset is remote. Progression is stable. Complicating factors, none.  COPD  History Shahzaib has a past medical history of Anginal pain ([place]), Atrial fibrillation by electrocardiogram ([place]) (11/2019), Atrial flutter by electrocardiogram ([place]) (11/2019), Bladder cancer ([place]) (01/2020), Bladder tumor, BPH (benign prostatic hyperplasia), CAD (coronary artery disease), COPD (chronic obstructive pulmonary disease) ([place]), Dyspnea, ETOH abuse, GERD (gastroesophageal reflux disease), History of gout, Hypercholesterolemia, Hypertension, Myocardial infarction ([place]) (2013), Prostate cancer ([place]), and Tobacco abuse.   He has a past surgical history that includes Prostate surgery; Colonoscopy; Back surgery; Esophagogastroduodenoscopy (05/27/2012); Cardiac catheterization (2011); Coronary angioplasty; Coronary artery bypass graft; Anterior cervical decompression/discectomy fusion 4 level (N/A, 07/21/2016); Colonoscopy (N/A, 11/21/2017); polypectomy (11/21/2017); Transurethral resection of bladder tumor with mitomycin-c  (Left, 01/06/2020); and Cardioversion (N/A, 06/10/2020).   His family history includes Diabetes in his brother, brother, and mother; Healthy in his son; Heart disease in his brother and brother; Hypertension in his father; Multiple sclerosis in his daughter.He reports that he has been smoking cigarettes and e-cigarettes. He started smoking about 50 years ago. He has a 28.50 pack-year smoking history. He quit smokeless tobacco use about 8 years ago.  His smokeless tobacco use included snuff. He reports current alcohol use of about 6.0 standard drinks of alcohol per week. He reports that he does not use drugs.  Current Outpatient Medications on File Prior to Visit  Medication Sig Dispense Refill  . acetaminophen (TYLENOL) 500 MG tablet Take 2 tablets (1,000 mg total) by mouth every 6 (six) hours as needed for moderate pain or headache. 90 tablet 0  . Budeson-Glycopyrrol-Formoterol (BREZTRI AEROSPHERE) 160-9-4.8 MCG/ACT AERO Take 2 puffs by mouth in the morning and at bedtime. Dispense 3 inhalers 3 g 4  . Cholecalciferol (VITAMIN D3) 50 MCG (2000 UT) TABS Take 2,000 Units by mouth daily.     . ferrous sulfate 325 (65 FE) MG tablet Take 1 tablet (325 mg total) by mouth daily with breakfast. 30 tablet 5  . warfarin (COUMADIN) 5 MG tablet TAKE 1 TABLET EVERY DAY 90 tablet 0   No current facility-administered medications on file prior to visit.    ROS Review of Systems  Constitutional: Negative for fever.  Respiratory: Negative for shortness of breath.   Cardiovascular: Negative for chest pain.  Musculoskeletal: Negative for arthralgias.  Skin: Negative for rash.    Objective:  BP 115/61   Pulse (!) 59   Temp 97.6 F (36.4 C)   Ht 5' 7"  (1.702 m)   Wt 152 lb 9.6 oz (69.2 kg)   SpO2 98%   BMI 23.90 kg/m   BP Readings from Last 3 Encounters:  11/24/20 115/61  10/26/20 135/62  09/07/20 134/76    Wt Readings from Last 3 Encounters:  11/24/20 152 lb 9.6 oz (69.2 kg)  10/26/20 153 lb 12.8  oz (69.8 kg)  09/07/20 151 lb 3.2 oz (68.6 kg)     Physical Exam Vitals reviewed.  Constitutional:      Appearance: He is well-developed.  HENT:     Head: Normocephalic and atraumatic.     Right Ear: Tympanic membrane and external ear normal. No decreased hearing noted.     Left Ear: Tympanic membrane and external ear normal. No decreased hearing noted.     Mouth/Throat:     Pharynx: No oropharyngeal exudate or posterior oropharyngeal erythema.  Eyes:     Pupils: Pupils are equal, round, and reactive to light.  Cardiovascular:     Rate and Rhythm: Normal rate and regular rhythm.     Heart sounds: No murmur heard.   Pulmonary:     Effort: No respiratory distress.     Breath sounds: Normal breath sounds.  Abdominal:     General: Bowel sounds are normal.     Palpations: Abdomen is soft. There is no mass.     Tenderness: There is no abdominal tenderness.  Musculoskeletal:     Cervical back: Normal range of motion and neck supple.     Lab Results  Component Value Date   HGBA1C 5.5 10/29/2015   HGBA1C 5.9 (H) 03/12/2015    Lab Results  Component Value Date   WBC 5.3 06/08/2020   HGB 13.4 06/08/2020   HCT 39.4 06/08/2020   PLT 185 06/08/2020   GLUCOSE 126 (H) 06/08/2020   CHOL 131 05/26/2020   TRIG 65 05/26/2020   HDL 49 05/26/2020   LDLCALC 68 05/26/2020   ALT 15 05/26/2020   AST 21 05/26/2020   NA 135 06/08/2020   K 3.9 06/08/2020   CL 103 06/08/2020   CREATININE 0.78 06/08/2020   BUN 9 06/08/2020   CO2 25 06/08/2020   TSH 0.514 10/29/2015   INR 3.3 (A) 11/23/2020   HGBA1C 5.5 10/29/2015    CT Super D Chest Wo Contrast  Result Date: 10/19/2020 CLINICAL DATA:  Follow-up of pulmonary nodule.  Possible preop. EXAM: CT CHEST WITHOUT CONTRAST TECHNIQUE: Multidetector CT imaging of the chest was performed using thin slice collimation for electromagnetic bronchoscopy planning purposes, without intravenous contrast. COMPARISON:  07/26/2020 screening CT. FINDINGS:  Cardiovascular: Aortic atherosclerosis. Tortuous thoracic aorta. Mild cardiomegaly with prior left lateral ventricular free wall infarct. Median sternotomy for CABG. Mediastinum/Nodes: No mediastinal or definite hilar adenopathy, given limitations of unenhanced CT. Lungs/Pleura: No pleural fluid. Mild to moderate centrilobular emphysema. Biapical pleuroparenchymal scarring. Anteromedial right apical 6 mm nodule on 23/4 is similar to on the prior exam. Calcified right lower lobe granuloma. Scattered noncalcified 1-2 mm subpleural pulmonary nodules are nonspecific, including in the right upper lobe on 52/4. Upper Abdomen: Normal imaged portions of the liver, spleen, stomach, pancreas, gallbladder, adrenal glands, kidneys. Musculoskeletal: Lower thoracic and upper cervical spondylosis. Mild superior endplate irregularity at T3 and T9. IMPRESSION: 1. Nearly 4 months stability of the right apical 6 mm pulmonary nodule. 2. No thoracic adenopathy. 3. Aortic atherosclerosis (ICD10-I70.0) and emphysema (ICD10-J43.9). Electronically Signed   By: Abigail Miyamoto M.D.   On: 10/19/2020 15:46    Assessment & Plan:   Tomas was seen today for medical management of chronic issues.  Diagnoses and all orders for this visit:  Atrial fibrillation, unspecified type ([place]) -  CBC with Differential/Platelet -     CMP14+EGFR  Coronary artery disease of native heart with stable angina pectoris, unspecified vessel or lesion type (HCC) -     CBC with Differential/Platelet -     CMP14+EGFR  Chronic obstructive pulmonary disease, unspecified COPD type (HCC) -     CBC with Differential/Platelet -     CMP14+EGFR -     fluticasone (FLONASE) 50 MCG/ACT nasal spray; Place 2 sprays into both nostrils daily as needed for allergies. -     albuterol (VENTOLIN HFA) 108 (90 Base) MCG/ACT inhaler; Inhale 2 puffs into the lungs every 6 (six) hours as needed for wheezing or shortness of breath.  Essential hypertension -     CBC with  Differential/Platelet -     CMP14+EGFR  Gastroesophageal reflux disease without esophagitis -     CBC with Differential/Platelet -     CMP14+EGFR  Hyperlipidemia, unspecified hyperlipidemia type -     CBC with Differential/Platelet -     CMP14+EGFR -     Lipid panel -     atorvastatin (LIPITOR) 80 MG tablet; TAKE 1 TABLET EVERY DAY AT 6 PM  Osteoarthritis of cervical spine with myelopathy and radiculopathy -     CBC with Differential/Platelet -     CMP14+EGFR  Acute gout of left knee, unspecified cause -     Uric acid  Benign prostatic hyperplasia without lower urinary tract symptoms  Coronary artery disease involving native heart without angina pectoris, unspecified vessel or lesion type -     nitroGLYCERIN (NITROSTAT) 0.4 MG SL tablet; Place 1 tablet (0.4 mg total) under the tongue every 5 (five) minutes as needed for chest pain.  Coronary artery disease involving native coronary artery of native heart without angina pectoris -     metoprolol tartrate (LOPRESSOR) 25 MG tablet; Take 0.5 tablets (12.5 mg total) by mouth 2 (two) times daily.  Essential hypertension, benign -     amLODipine (NORVASC) 5 MG tablet; Take 1 tablet (5 mg total) by mouth daily.  H/O: gout -     allopurinol (ZYLOPRIM) 100 MG tablet; Take 1 tablet (100 mg total) by mouth daily.  Other orders -     omeprazole (PRILOSEC) 20 MG capsule; Take 1 capsule (20 mg total) by mouth daily.   I have discontinued Deidre Ala L. Tones's tamsulosin. I have also changed his omeprazole, metoprolol tartrate, amLODipine, allopurinol, and albuterol. Additionally, I am having him maintain his Vitamin D3, ferrous sulfate, acetaminophen, Breztri Aerosphere, warfarin, nitroGLYCERIN, fluticasone, and atorvastatin.  Meds ordered this encounter  Medications  . omeprazole (PRILOSEC) 20 MG capsule    Sig: Take 1 capsule (20 mg total) by mouth daily.    Dispense:  90 capsule    Refill:  1  . nitroGLYCERIN (NITROSTAT) 0.4 MG SL tablet     Sig: Place 1 tablet (0.4 mg total) under the tongue every 5 (five) minutes as needed for chest pain.    Dispense:  75 tablet    Refill:  2  . metoprolol tartrate (LOPRESSOR) 25 MG tablet    Sig: Take 0.5 tablets (12.5 mg total) by mouth 2 (two) times daily.    Dispense:  90 tablet    Refill:  1  . fluticasone (FLONASE) 50 MCG/ACT nasal spray    Sig: Place 2 sprays into both nostrils daily as needed for allergies.    Dispense:  48 g    Refill:  2  . atorvastatin (LIPITOR) 80 MG tablet  Sig: TAKE 1 TABLET EVERY DAY AT 6 PM    Dispense:  90 tablet    Refill:  1  . amLODipine (NORVASC) 5 MG tablet    Sig: Take 1 tablet (5 mg total) by mouth daily.    Dispense:  90 tablet    Refill:  1  . allopurinol (ZYLOPRIM) 100 MG tablet    Sig: Take 1 tablet (100 mg total) by mouth daily.    Dispense:  90 tablet    Refill:  1  . albuterol (VENTOLIN HFA) 108 (90 Base) MCG/ACT inhaler    Sig: Inhale 2 puffs into the lungs every 6 (six) hours as needed for wheezing or shortness of breath.    Dispense:  3 each    Refill:  3    Please consider 90 day supplies to promote better adherence     Follow-up: Return in about 6 months (around 05/26/2021) for COPD, [place] physical.  [name], M.D.

## 2020-11-25 LAB — LIPID PANEL
Chol/HDL Ratio: 2.4 ratio (ref 0.0–5.0)
Cholesterol, Total: 142 mg/dL (ref 100–199)
HDL: 59 mg/dL (ref >39)
LDL Chol Calc (NIH): 73 mg/dL (ref 0–99)
Triglycerides: 40 mg/dL (ref 0–149)
VLDL Cholesterol Cal: 10 mg/dL (ref 5–40)

## 2020-11-25 LAB — CBC WITH DIFFERENTIAL/PLATELET
Basophils Absolute: 0.1 10*3/uL (ref 0.0–0.2)
Basos: 1 %
EOS (ABSOLUTE): 0.1 10*3/uL (ref 0.0–0.4)
Eos: 1 %
Hematocrit: 39.1 % (ref 37.5–51.0)
Hemoglobin: 13.2 g/dL (ref 13.0–17.7)
Immature Grans (Abs): 0 10*3/uL (ref 0.0–0.1)
Immature Granulocytes: 0 %
Lymphocytes Absolute: 2.3 10*3/uL (ref 0.7–3.1)
Lymphs: 34 %
MCH: 33.9 pg — ABNORMAL HIGH (ref 26.6–33.0)
MCHC: 33.8 g/dL (ref 31.5–35.7)
MCV: 101 fL — ABNORMAL HIGH (ref 79–97)
Monocytes Absolute: 0.5 10*3/uL (ref 0.1–0.9)
Monocytes: 7 %
Neutrophils Absolute: 3.8 10*3/uL (ref 1.4–7.0)
Neutrophils: 57 %
Platelets: 197 10*3/uL (ref 150–450)
RBC: 3.89 x10E6/uL — ABNORMAL LOW (ref 4.14–5.80)
RDW: 14.5 % (ref 11.6–15.4)
WBC: 6.7 10*3/uL (ref 3.4–10.8)

## 2020-11-25 LAB — CMP14+EGFR
ALT: 18 IU/L (ref 0–44)
AST: 25 IU/L (ref 0–40)
Albumin/Globulin Ratio: 1.6 (ref 1.2–2.2)
Albumin: 3.9 g/dL (ref 3.7–4.7)
Alkaline Phosphatase: 102 IU/L (ref 44–121)
BUN/Creatinine Ratio: 10 (ref 10–24)
BUN: 9 mg/dL (ref 8–27)
Bilirubin Total: 0.3 mg/dL (ref 0.0–1.2)
CO2: 24 mmol/L (ref 20–29)
Calcium: 8.7 mg/dL (ref 8.6–10.2)
Chloride: 101 mmol/L (ref 96–106)
Creatinine, Ser: 0.91 mg/dL (ref 0.76–1.27)
Globulin, Total: 2.5 g/dL (ref 1.5–4.5)
Glucose: 100 mg/dL — ABNORMAL HIGH (ref 65–99)
Potassium: 4.6 mmol/L (ref 3.5–5.2)
Sodium: 139 mmol/L (ref 134–144)
Total Protein: 6.4 g/dL (ref 6.0–8.5)
eGFR: 89 mL/min/{1.73_m2} (ref >59)

## 2020-11-25 LAB — URIC ACID: Uric Acid: 4.4 mg/dL (ref 3.8–8.4)

## 2020-11-26 ENCOUNTER — Ambulatory Visit (INDEPENDENT_AMBULATORY_CARE_PROVIDER_SITE_OTHER): Payer: Medicare HMO

## 2020-11-26 VITALS — Ht 67.0 in | Wt 152.0 lb

## 2020-11-26 DIAGNOSIS — Z0000 Encounter for general adult medical examination without abnormal findings: Secondary | ICD-10-CM | POA: Diagnosis not present

## 2020-11-26 NOTE — Patient Instructions (Signed)
[name] , Thank you for taking time to come for your Medicare Wellness Visit. I appreciate your ongoing commitment to your health goals. Please review the following plan we discussed and let me know if I can assist you in the future.   Screening recommendations/referrals: Colonoscopy: Done 11/21/2017 - Repeat 5 years (2024) Recommended yearly ophthalmology/optometry visit for glaucoma screening and checkup Recommended yearly dental visit for hygiene and checkup  Vaccinations: Influenza vaccine: Done 05/26/2020 - Repeat every fall Pneumococcal vaccine: Done 11/27/2012 & 01/19/2017 Tdap vaccine: Done 05/26/2020 - Repeat in 10 years (2031) Shingles vaccine: Shingrix discussed. Please contact your pharmacy for coverage information.    Covid-19: Done 11/06/2019, 12/04/2019, & 07/20/2020  Advanced directives: Advance directive discussed with you today. I have provided a copy for you to complete at home and have notarized. Once this is complete please bring a copy in to our office so we can scan it into your chart.  Conditions/risks identified: Aim for 30 minutes of exercise or brisk walking each day, drink 6-8 glasses of water and eat lots of fruits and vegetables. If you wish to quit smoking, help is available. For free tobacco cessation program offerings call the [facility] at 336-673-1796 or Live Well Line at (858)406-0046. You may also visit [email or web address] or email [email or web address] [email or web address] for more information on other programs.   You may also call 1-800-QUIT-NOW 936-769-3835) or visit www.[employer].dk or www.[facility].org for additional resources on smoking cessation.   Next appointment: Follow up in one year for your annual wellness visit.   Preventive Care 19 Years and Older, Male  Preventive care refers to lifestyle choices and visits with your health care provider that can promote health and wellness. What does preventive care include?  A yearly physical  exam. This is also called an annual well check.  Dental exams once or twice a year.  Routine eye exams. Ask your health care provider how often you should have your eyes checked.  Personal lifestyle choices, including:  Daily care of your teeth and gums.  Regular physical activity.  Eating a healthy diet.  Avoiding tobacco and drug use.  Limiting alcohol use.  Practicing safe sex.  Taking low doses of aspirin every day.  Taking vitamin and mineral supplements as recommended by your health care provider. What happens during an annual well check? The services and screenings done by your health care provider during your annual well check will depend on your age, overall health, lifestyle risk factors, and family history of disease. Counseling  Your health care provider may ask you questions about your:  Alcohol use.  Tobacco use.  Drug use.  Emotional well-being.  Home and relationship well-being.  Sexual activity.  Eating habits.  History of falls.  Memory and ability to understand (cognition).  Work and work Statistician. Screening  You may have the following tests or measurements:  Height, weight, and BMI.  Blood pressure.  Lipid and cholesterol levels. These may be checked every 5 years, or more frequently if you are over 76 years old.  Skin check.  Lung cancer screening. You may have this screening every year starting at age 20 if you have a 30-pack-year history of smoking and currently smoke or have quit within the past 15 years.  Fecal occult blood test (FOBT) of the stool. You may have this test every year starting at age 12.  Flexible sigmoidoscopy or colonoscopy. You may have a sigmoidoscopy every 5 years or a colonoscopy every 10 years starting  at age 34.  Prostate cancer screening. Recommendations will vary depending on your family history and other risks.  Hepatitis C blood test.  Hepatitis B blood test.  Sexually transmitted disease  (STD) testing.  Diabetes screening. This is done by checking your blood sugar (glucose) after you have not eaten for a while (fasting). You may have this done every 1-3 years.  Abdominal aortic aneurysm (AAA) screening. You may need this if you are a current or former smoker.  Osteoporosis. You may be screened starting at age 51 if you are at high risk. Talk with your health care provider about your test results, treatment options, and if necessary, the need for more tests. Vaccines  Your health care provider may recommend certain vaccines, such as:  Influenza vaccine. This is recommended every year.  Tetanus, diphtheria, and acellular pertussis (Tdap, Td) vaccine. You may need a Td booster every 10 years.  Zoster vaccine. You may need this after age 80.  Pneumococcal 13-valent conjugate (PCV13) vaccine. One dose is recommended after age 24.  Pneumococcal polysaccharide (PPSV23) vaccine. One dose is recommended after age 52. Talk to your health care provider about which screenings and vaccines you need and how often you need them. This information is not intended to replace advice given to you by your health care provider. Make sure you discuss any questions you have with your health care provider. Document Released: 08/20/2015 Document Revised: 04/12/2016 Document Reviewed: 05/25/2015 Elsevier Interactive Patient Education  2017 [employer] Prevention in the Home Falls can cause injuries. They can happen to people of all ages. There are many things you can do to make your home safe and to help prevent falls. What can I do on the outside of my home?  Regularly fix the edges of walkways and driveways and fix any cracks.  Remove anything that might make you trip as you walk through a door, such as a raised step or threshold.  Trim any bushes or trees on the path to your home.  Use bright outdoor lighting.  Clear any walking paths of anything that might make someone trip,  such as rocks or tools.  Regularly check to see if handrails are loose or broken. Make sure that both sides of any steps have handrails.  Any raised decks and porches should have guardrails on the edges.  Have any leaves, snow, or ice cleared regularly.  Use sand or salt on walking paths during winter.  Clean up any spills in your garage right away. This includes oil or grease spills. What can I do in the bathroom?  Use night lights.  Install grab bars by the toilet and in the tub and shower. Do not use towel bars as grab bars.  Use non-skid mats or decals in the tub or shower.  If you need to sit down in the shower, use a plastic, non-slip stool.  Keep the floor dry. Clean up any water that spills on the floor as soon as it happens.  Remove soap buildup in the tub or shower regularly.  Attach bath mats securely with double-sided non-slip rug tape.  Do not have throw rugs and other things on the floor that can make you trip. What can I do in the bedroom?  Use night lights.  Make sure that you have a light by your bed that is easy to reach.  Do not use any sheets or blankets that are too big for your bed. They should not hang down onto  the floor.  Have a firm chair that has side arms. You can use this for support while you get dressed.  Do not have throw rugs and other things on the floor that can make you trip. What can I do in the kitchen?  Clean up any spills right away.  Avoid walking on wet floors.  Keep items that you use a lot in easy-to-reach places.  If you need to reach something above you, use a strong step stool that has a grab bar.  Keep electrical cords out of the way.  Do not use floor polish or wax that makes floors slippery. If you must use wax, use non-skid floor wax.  Do not have throw rugs and other things on the floor that can make you trip. What can I do with my stairs?  Do not leave any items on the stairs.  Make sure that there are  handrails on both sides of the stairs and use them. Fix handrails that are broken or loose. Make sure that handrails are as long as the stairways.  Check any carpeting to make sure that it is firmly attached to the stairs. Fix any carpet that is loose or worn.  Avoid having throw rugs at the top or bottom of the stairs. If you do have throw rugs, attach them to the floor with carpet tape.  Make sure that you have a light switch at the top of the stairs and the bottom of the stairs. If you do not have them, ask someone to add them for you. What else can I do to help prevent falls?  Wear shoes that:  Do not have high heels.  Have rubber bottoms.  Are comfortable and fit you well.  Are closed at the toe. Do not wear sandals.  If you use a stepladder:  Make sure that it is fully opened. Do not climb a closed stepladder.  Make sure that both sides of the stepladder are locked into place.  Ask someone to hold it for you, if possible.  Clearly mark and make sure that you can see:  Any grab bars or handrails.  First and last steps.  Where the edge of each step is.  Use tools that help you move around (mobility aids) if they are needed. These include:  Canes.  Walkers.  Scooters.  Crutches.  Turn on the lights when you go into a dark area. Replace any light bulbs as soon as they burn out.  Set up your furniture so you have a clear path. Avoid moving your furniture around.  If any of your floors are uneven, fix them.  If there are any pets around you, be aware of where they are.  Review your medicines with your doctor. Some medicines can make you feel dizzy. This can increase your chance of falling. Ask your doctor what other things that you can do to help prevent falls. This information is not intended to replace advice given to you by your health care provider. Make sure you discuss any questions you have with your health care provider. Document Released: 05/20/2009  Document Revised: 12/30/2015 Document Reviewed: 08/28/2014 Elsevier Interactive Patient Education  2017 [employer].

## 2020-11-26 NOTE — Progress Notes (Signed)
Subjective:   [name] is a 75 y.o. male who presents for Medicare Annual/Subsequent preventive examination.  Virtual Visit via Telephone Note  I connected with  [name] on 11/26/20 at  1:15 PM EDT by telephone and verified that I am speaking with the correct person using two identifiers.  Location: Patient: Home Provider: [facility] Persons participating in the virtual visit: patient/Nurse Health Advisor   I discussed the limitations, risks, security and privacy concerns of performing an evaluation and management service by telephone and the availability of in person appointments. The patient expressed understanding and agreed to proceed.  Interactive audio and video telecommunications were attempted between this nurse and patient, however failed, due to patient having technical difficulties OR patient did not have access to video capability.  We continued and completed visit with audio only.  Some vital signs may be absent or patient reported.   Ralph Lightner E Tongela Encinas, LPN   Review of Systems     Cardiac Risk Factors include: advanced age (>46men, >24 women);male gender;smoking/ tobacco exposure     Objective:    Today's Vitals   11/26/20 1240  Weight: 152 lb (68.9 kg)  Height: 5\' 7"  (1.702 m)   Body mass index is 23.81 kg/m.  Advanced Directives 11/26/2020 06/10/2020 06/08/2020 01/06/2020 12/29/2019 11/26/2019 11/19/2019  Does Patient Have a Medical Advance Directive? No No No No No No No  Would patient like information on creating a medical advance directive? Yes (MAU/Ambulatory/Procedural Areas - Information given) No - Patient declined No - Patient declined No - Patient declined - No - Patient declined No - Patient declined  Pre-existing out of facility DNR order (yellow form or pink MOST form) - - - - - - -    Current Medications (verified) Outpatient Encounter Medications as of 11/26/2020  Medication Sig  . acetaminophen (TYLENOL) 500 MG tablet Take 2 tablets (1,000 mg  total) by mouth every 6 (six) hours as needed for moderate pain or headache.  . albuterol (VENTOLIN HFA) 108 (90 Base) MCG/ACT inhaler Inhale 2 puffs into the lungs every 6 (six) hours as needed for wheezing or shortness of breath.  . allopurinol (ZYLOPRIM) 100 MG tablet Take 1 tablet (100 mg total) by mouth daily.  Marland Kitchen amLODipine (NORVASC) 5 MG tablet Take 1 tablet (5 mg total) by mouth daily.  Marland Kitchen atorvastatin (LIPITOR) 80 MG tablet TAKE 1 TABLET EVERY DAY AT 6 PM  . Budeson-Glycopyrrol-Formoterol (BREZTRI AEROSPHERE) 160-9-4.8 MCG/ACT AERO Take 2 puffs by mouth in the morning and at bedtime. Dispense 3 inhalers  . Cholecalciferol (VITAMIN D3) 50 MCG (2000 UT) TABS Take 2,000 Units by mouth daily.   . ferrous sulfate 325 (65 FE) MG tablet Take 1 tablet (325 mg total) by mouth daily with breakfast.  . fluticasone (FLONASE) 50 MCG/ACT nasal spray Place 2 sprays into both nostrils daily as needed for allergies.  . metoprolol tartrate (LOPRESSOR) 25 MG tablet Take 0.5 tablets (12.5 mg total) by mouth 2 (two) times daily.  . nitroGLYCERIN (NITROSTAT) 0.4 MG SL tablet Place 1 tablet (0.4 mg total) under the tongue every 5 (five) minutes as needed for chest pain.  Marland Kitchen omeprazole (PRILOSEC) 20 MG capsule Take 1 capsule (20 mg total) by mouth daily.  Marland Kitchen warfarin (COUMADIN) 5 MG tablet TAKE 1 TABLET EVERY DAY   No facility-administered encounter medications on file as of 11/26/2020.    Allergies (verified) Patient has no known allergies.   History: Past Medical History:  Diagnosis Date  . Anginal  pain ([place])   . Atrial fibrillation by electrocardiogram ([place]) 11/2019  . Atrial flutter by electrocardiogram ([place]) 11/2019  . Bladder cancer ([place]) 01/2020  . Bladder tumor   . BPH (benign prostatic hyperplasia)   . CAD (coronary artery disease)    a. s/p INF MI 1997;  b. s/p CABG 2001;  c. [place] 11/2009:  3v CAD, S-PDA ok with 40% mid, S-OM ok, S-Dx ok, L-LAD ok, EF 50%;  d.  [place] 5/14:  Inferolateral scar, EF  46%, no ischemia  . COPD (chronic obstructive pulmonary disease) ([place])   . Dyspnea   . ETOH abuse   . GERD (gastroesophageal reflux disease)   . History of gout   . Hypercholesterolemia   . Hypertension   . Myocardial infarction ([place]) 2013  . Prostate cancer ([place])   . Tobacco abuse    Past Surgical History:  Procedure Laterality Date  . ANTERIOR CERVICAL DECOMPRESSION/DISCECTOMY FUSION 4 LEVELS N/A 07/21/2016   Procedure: ANTERIOR CERVICAL DECOMPRESSION/DISCECTOMY FUSION CERVICAL TWO-THREE, CERVICAL THREE-FOUR,. CERVICAL FOUR-FIVE, CERVICAL  FIVE-SIX;  Surgeon: Ashok Pall, MD;  Location: [facility];  Service: Neurosurgery;  Laterality: N/A;  . BACK SURGERY    . CARDIAC CATHETERIZATION  2011  . CARDIOVERSION N/A 06/10/2020   Procedure: CARDIOVERSION;  Surgeon: Arnoldo Lenis, MD;  [facility];  Service: Endoscopy;  Laterality: N/A;  . COLONOSCOPY     in remote past, Dr. Sharlett Iles. Obtaining records.   . COLONOSCOPY N/A 11/21/2017   Procedure: COLONOSCOPY;  Surgeon: Daneil Dolin, MD;  [facility];  Service: Endoscopy;  Laterality: N/A;  1:15  . CORONARY ANGIOPLASTY    . CORONARY ARTERY BYPASS GRAFT    . ESOPHAGOGASTRODUODENOSCOPY  05/27/2012   ZOX:WRUEAV esophagus, stomach and duodenum s/p dilator  . POLYPECTOMY  11/21/2017   Procedure: POLYPECTOMY;  Surgeon: Daneil Dolin, MD;  [facility];  Service: Endoscopy;;  colon  . PROSTATE SURGERY    . TRANSURETHRAL RESECTION OF BLADDER TUMOR WITH MITOMYCIN-C Left 01/06/2020   Procedure: CYSTOSCOPY TRANSURETHRAL RESECTION OF BLADDER TUMOR,LEFT URETERSCOPY AND GEMCITABINE;  Surgeon: Irine Seal, MD;  Location: [facility];  Service: Urology;  Laterality: Left;   Family History  Problem Relation Age of Onset  . Diabetes Mother   . Hypertension Father   . Heart disease Brother   . Diabetes Brother   . Heart disease Brother   . Diabetes Brother   . Multiple sclerosis Daughter   . Healthy Son   . Colon  cancer Neg Hx    Social History   Socioeconomic History  . Marital status: Married    Spouse name: Not on file  . Number of children: 2  . Years of education: Not on file  . Highest education level: Not on file  Occupational History    Comment: retired  Tobacco Use  . Smoking status: Current Every Day Smoker    Packs/day: 0.50    Years: 58.00    Pack years: 29.00    Types: Cigarettes, E-cigarettes    Start date: 03/08/1970  . Smokeless tobacco: Former Systems developer    Types: Snuff    Quit date: 03/08/2012  . Tobacco comment: 5-6 cigarettes smoked per day and vaping as well [facility] 10/26/20  Vaping Use  . Vaping Use: Every day  Substance and Sexual Activity  . Alcohol use: Yes    Alcohol/week: 6.0 standard drinks    Types: 6 Cans of beer per week    Comment: every other day  he drinks two 40oz beers  . Drug use: No  . Sexual activity: Yes    Partners: Female  Other Topics Concern  . Not on file  Social History Narrative   His brother lives with him and his wife. Daughter lives in [place], son lives nearby.   Social Determinants of Health   Financial Resource Strain: Low Risk   . Difficulty of Paying Living Expenses: Not hard at all  Food Insecurity: No Food Insecurity  . Worried About Charity fundraiser in the Last Year: Never true  . Ran Out of Food in the Last Year: Never true  Transportation Needs: No Transportation Needs  . Lack of Transportation (Medical): No  . Lack of Transportation (Non-Medical): No  Physical Activity: Insufficiently Active  . Days of Exercise per Week: 7 days  . Minutes of Exercise per Session: 20 min  Stress: No Stress Concern Present  . Feeling of Stress : Not at all  Social Connections: Moderately Isolated  . Frequency of Communication with Friends and Family: More than three times a week  . Frequency of Social Gatherings with Friends and Family: More than three times a week  . Attends Religious Services: Never  . Active Member of Clubs or Organizations:  No  . Attends Archivist Meetings: Never  . Marital Status: Married    Tobacco Counseling Ready to quit: No Counseling given: Yes Comment: 5-6 cigarettes smoked per day and vaping as well [facility] 10/26/20   Clinical Intake:  Pre-visit preparation completed: Yes  Pain : No/denies pain     BMI - recorded: 23.81 Nutritional Status: BMI of 19-24  Normal Nutritional Risks: None Diabetes: No  How often do you need to have someone help you when you read instructions, pamphlets, or other written materials from your doctor or pharmacy?: 1 - Never  Diabetic? no  Interpreter Needed?: No  Information entered by :: Liam Bossman, LPN   Activities of Daily Living In your present state of health, do you have any difficulty performing the following activities: 11/26/2020 06/08/2020  Hearing? N N  Vision? N N  Difficulty concentrating or making decisions? N N  Walking or climbing stairs? N N  Dressing or bathing? N N  Doing errands, shopping? N N  Preparing Food and eating ? N -  Using the Toilet? N -  In the past six months, have you accidently leaked urine? N -  Do you have problems with loss of bowel control? N -  Managing your Medications? N -  Managing your Finances? N -  Housekeeping or managing your Housekeeping? N -  Some recent data might be hidden    Patient Care Team: [name], MD as PCP - General (Family Medicine) [name], Alphonse Guild, MD as PCP - Cardiology (Cardiology) Gala Romney Cristopher Estimable, MD (Gastroenterology) [name] Alphonse Guild, MD as Consulting Physician (Cardiology) Harlen Labs, MD as Referring Physician (Optometry) Lavera Guise, [facility] as Pharmacist (Family Medicine)  Indicate any recent Medical Services you may have received from other than [facility] providers in the past year (date may be approximate).     Assessment:   This is a routine wellness examination for [name].  Hearing/Vision screen  Hearing Screening   125Hz  250Hz  500Hz  1000Hz  2000Hz   3000Hz  4000Hz  6000Hz  8000Hz   Right ear:           Left ear:           Comments: Wears hearing aids - behind on audiology exam -  can't remember where he got them.  Vision Screening Comments: Wears eyeglasses - can't remember where he got them - behind on eye exam  Dietary issues and exercise activities discussed: Current Exercise Habits: Home exercise routine, Type of exercise: walking, Time (Minutes): 20, Frequency (Times/Week): 7, Weekly Exercise (Minutes/Week): 140, Intensity: Mild, Exercise limited by: respiratory conditions(s)  Goals    . DIET - EAT MORE FRUITS AND VEGETABLES    . DIET - INCREASE WATER INTAKE     Increase water intake to 48 oz per day     . Exercise 150 minutes per week (moderate activity)    . Have 3 meals a day    . Quit smoking / using tobacco     Goals Addressed             This Visit's Progress   . Quit smoking / using tobacco   Not on track            Depression Screen PHQ 2/9 Scores 11/26/2020 11/24/2020 05/26/2020 11/26/2019 11/25/2019 10/08/2019 05/12/2019  PHQ - 2 Score 0 0 0 0 0 0 0    Fall Risk Fall Risk  11/26/2020 11/24/2020 05/26/2020 11/26/2019 11/25/2019  Falls in the past year? 0 0 0 0 0  Number falls in past yr: 0 - 0 - 0  Injury with Fall? 0 - 0 - 0  Risk for fall due to : No Fall Risks - No Fall Risks - History of fall(s)  Follow up Falls prevention discussed - Falls evaluation completed - Falls evaluation completed    [place]:  Any stairs in or around the home? Yes  If so, are there any without handrails? Yes  Home free of loose throw rugs in walkways, pet beds, electrical cords, etc? Yes  Adequate lighting in your home to reduce risk of falls? Yes   ASSISTIVE DEVICES UTILIZED TO PREVENT FALLS:  Life alert? No  Use of a cane, walker or w/c? No  Grab bars in the bathroom? No  Shower chair or bench in shower? No  Elevated toilet seat or a handicapped toilet? No   TIMED UP AND GO:  Was the  test performed? No . Telephonic visit  Cognitive Function:Normal cognitive status assessed by direct observation by this Nurse Health Advisor. No abnormalities found.   MMSE - Mini Mental State Exam 04/24/2018 01/19/2017  Not completed: - Unable to complete  Orientation to time 5 5  Orientation to Place 5 -  Registration 3 -  Attention/ Calculation 0 -  Recall 3 -  Language- name 2 objects 2 -  Language- repeat 1 -  Language- follow 3 step command 3 -  Language- read & follow direction 0 -  Write a sentence 0 -  Copy design 1 -  Total score 23 -     6CIT Screen 11/26/2019  What Year? 0 points  What month? 0 points  What time? 0 points  Count back from 20 0 points  Months in reverse 4 points  Repeat phrase 8 points  Total Score 12    Immunizations Immunization History  Administered Date(s) Administered  . Fluad Quad(high Dose 65+) 05/12/2019, 05/26/2020  . Influenza, High Dose Seasonal PF 06/25/2018  . Influenza,inj,Quad PF,6+ Mos 05/08/2016, 07/23/2017  . Moderna Sars-Covid-2 Vaccination 11/06/2019, 12/04/2019, 07/20/2020  . Pneumococcal Conjugate-13 01/19/2017  . Pneumococcal Polysaccharide-23 11/27/2012  . Tdap 05/26/2020    TDAP status: Up to date  Flu Vaccine status: Up to date  Pneumococcal vaccine status: Up to date  Covid-19 vaccine status: Completed vaccines  Qualifies for Shingles Vaccine? Yes   Zostavax completed No   Shingrix Completed?: No.    Education has been provided regarding the importance of this vaccine. Patient has been advised to call insurance company to determine out of pocket expense if they have not yet received this vaccine. Advised may also receive vaccine at local pharmacy or Health Dept. Verbalized acceptance and understanding.  Screening Tests Health Maintenance  Topic Date Due  . COVID-19 Vaccine (4 - Booster for Moderna series) 01/18/2021  . INFLUENZA VACCINE  03/07/2021  . COLONOSCOPY (Pts 45-64yrs Insurance coverage will need  to be confirmed)  11/22/2022  . TETANUS/TDAP  05/26/2030  . Hepatitis C Screening  Completed  . PNA vac Low Risk Adult  Completed  . HPV VACCINES  Aged Out    Health Maintenance  There are no preventive care reminders to display for this patient.  Colorectal cancer screening: Type of screening: Colonoscopy. Completed 11/21/2017. Repeat every 5 years  Lung Cancer Screening: (Low Dose CT Chest recommended if Age 21-80 years, 30 pack-year currently smoking OR have quit w/in 15years.) does not qualify.   Lung Cancer Screening Referral: Will qualify for this in 2 more years if he continues to smoke  Additional Screening:  Hepatitis C Screening: does qualify; Completed 10/29/2015  Vision Screening: Recommended annual ophthalmology exams for early detection of glaucoma and other disorders of the eye. Is the patient up to date with their annual eye exam?  No  Who is the provider or what is the name of the office in which the patient attends annual eye exams? unknown If pt is not established with a provider, would they like to be referred to a provider to establish care? No . He will check with [facility] soon.  Dental Screening: Recommended annual dental exams for proper oral hygiene  Community Resource Referral / Chronic Care Management: CRR required this visit?  No   CCM required this visit?  No      Plan:     I have personally reviewed and noted the following in the patient's chart:   . Medical and social history . Use of alcohol, tobacco or illicit drugs  . Current medications and supplements . Functional ability and status . Nutritional status . Physical activity . Advanced directives . List of other physicians . Hospitalizations, surgeries, and ER visits in previous 12 months . Vitals . Screenings to include cognitive, depression, and falls . Referrals and appointments  In addition, I have reviewed and discussed with patient certain preventive protocols, quality metrics,  and best practice recommendations. A written personalized care plan for preventive services as well as general preventive health recommendations were provided to patient.     Sandrea Hammond, LPN   7/35/3299   Nurse Notes: None

## 2020-11-29 ENCOUNTER — Other Ambulatory Visit: Payer: Self-pay | Admitting: *Deleted

## 2020-11-30 LAB — B12 AND FOLATE PANEL
Folate: 2.8 ng/mL — ABNORMAL LOW (ref >3.0)
Vitamin B-12: 294 pg/mL (ref 232–1245)

## 2020-11-30 LAB — SPECIMEN STATUS REPORT

## 2020-12-02 ENCOUNTER — Other Ambulatory Visit: Payer: Self-pay

## 2020-12-02 ENCOUNTER — Encounter: Payer: Self-pay | Admitting: Emergency Medicine

## 2020-12-02 ENCOUNTER — Ambulatory Visit: Payer: Medicare HMO | Admitting: Emergency Medicine

## 2020-12-02 DIAGNOSIS — J301 Allergic rhinitis due to pollen: Secondary | ICD-10-CM

## 2020-12-02 DIAGNOSIS — R918 Other nonspecific abnormal finding of lung field: Secondary | ICD-10-CM

## 2020-12-02 DIAGNOSIS — J449 Chronic obstructive pulmonary disease, unspecified: Secondary | ICD-10-CM

## 2020-12-02 DIAGNOSIS — J309 Allergic rhinitis, unspecified: Secondary | ICD-10-CM | POA: Insufficient documentation

## 2020-12-02 NOTE — Assessment & Plan Note (Signed)
He uses fluticasone nasal spray as needed.  I asked him to start taking it every day on a schedule at least through the spring allergy season since he has been more symptomatic.

## 2020-12-02 NOTE — Assessment & Plan Note (Signed)
Stable 6 mm pulmonary nodule.  Has been stable for 4 months, plan for next scan in 6 months

## 2020-12-02 NOTE — Patient Instructions (Addendum)
Please continue Breztri 2 puffs twice a day.  Rinse and gargle after using. Keep albuterol available use 2 puffs if needed for shortness of breath, chest tightness, wheezing. You need a repeat CT scan of your chest in mid September 2022 to follow your pulmonary nodule for stability. Work hard on decreasing your smoking -try to get down to 5 cigarettes daily by our next visit.Marland Kitchen  Ultimate goal will be to stop altogether. Follow with Dr. Lamonte Sakai in September after your CT or sooner if you have any problems.

## 2020-12-02 NOTE — Assessment & Plan Note (Signed)
Please continue Breztri 2 puffs twice a day.  Rinse and gargle after using. Keep albuterol available use 2 puffs if needed for shortness of breath, chest tightness, wheezing

## 2020-12-02 NOTE — Progress Notes (Signed)
   Subjective:    [name], male    DOB: Jan 26, 1946, 75 y.o.   MRN: [id number]  HPI 75 year old smoker (60+ pack years, 6 cig a day) with a history of CAD/CABG, hypertension, atrial fibrillation/flutter (cardioverted 06/2020), bladder cancer (Dr Jeffie Pollock), prostate cancer (Dr Jeffie Pollock), GERD.  He has a history of COPD.  No PFT available.  He is on Trelegy in the past, about to change to [place]. Never needs his albuterol. Has am cough, has rare wheeze. He tries to stay active, but is limited by dyspnea.   He is referred today for an abnormal CT chest.  He underwent lung cancer screening CT on 07/26/2020 that I have reviewed, showed a new 6.8 mm right apical pulmonary nodule (from 05/28/2019), stable centrilobular and paraseptal emphysema and stable previously noted pulmonary nodules.  ROV 12/02/20 --[name] is 61, active smoker with CAD/CABG, hypertension, A. fib/flutter, bladder cancer, prostate cancer, GERD, COPD.  Seen for his obstructive lung disease and also for an abnormal CT scan of the chest on lung cancer screening 07/26/2020 that showed a 6.8 mm right apical pulmonary nodule.  He has been started on [employer].  He reports today that he has am cough - productive of white mucous. Has exertional SOB with walking the dog. Does get some relief from albuterol, uses 1-2x a day. Worse lately during the pollen season.  PFT were planned for today but these have not been done - doesn't look like they were scheduled.  On omeprazole qd, flonase prn He is smoking 10 cig a day.   Repeat CT chest performed 10/19/2020 reviewed by me shows a calcified right lower lobe granuloma, stable 6 mm anteromedial right apical noncalcified pulmonary nodule, no mediastinal or hilar lymphadenopathy, mild to moderate centrilobular emphysematous change with some biapical scar   Review of Systems As per HPI        Objective:   Physical Exam  Vitals:   12/02/20 1353  BP: 130/70  Pulse: (!) 50  Temp: 98  F (36.7 C)  TempSrc: Temporal  SpO2: 97%  Weight: 150 lb 12.8 oz (68.4 kg)  Height: 5\' 6"  (1.676 m)   Gen: Pleasant, well-nourished, in no distress,  normal affect  ENT: No lesions,  mouth clear,  oropharynx clear, no postnasal drip  Neck: No JVD, no stridor  Lungs: No use of accessory muscles, no crackles or wheezing on normal respiration, no wheeze on forced expiration  Cardiovascular: RRR, heart sounds normal, no murmur or gallops, no peripheral edema  Musculoskeletal: No deformities, no cyanosis or clubbing  Neuro: alert, awake, non focal  Skin: Warm, no lesions or rash     Assessment & Plan:  COPD (chronic obstructive pulmonary disease) (HCC) Please continue Breztri 2 puffs twice a day.  Rinse and gargle after using. Keep albuterol available use 2 puffs if needed for shortness of breath, chest tightness, wheezing  Pulmonary nodules Stable 6 mm pulmonary nodule.  Has been stable for 4 months, plan for next scan in 6 months  Allergic rhinitis He uses fluticasone nasal spray as needed.  I asked him to start taking it every day on a schedule at least through the spring allergy season since he has been more symptomatic.   Baltazar Apo, MD, PhD 12/02/2020, 2:13 PM [facility] [phone] or if no answer [phone]

## 2020-12-14 ENCOUNTER — Ambulatory Visit (INDEPENDENT_AMBULATORY_CARE_PROVIDER_SITE_OTHER): Payer: Medicare HMO | Admitting: *Deleted

## 2020-12-14 ENCOUNTER — Other Ambulatory Visit: Payer: Self-pay

## 2020-12-14 DIAGNOSIS — I4891 Unspecified atrial fibrillation: Secondary | ICD-10-CM

## 2020-12-14 DIAGNOSIS — Z5181 Encounter for therapeutic drug level monitoring: Secondary | ICD-10-CM

## 2020-12-14 LAB — POCT INR: INR: 2.5 (ref 2.0–3.0)

## 2020-12-14 NOTE — Patient Instructions (Signed)
Continue warfarin 1 tablet daily except 1/2 tablet on Sundays and Wednesdays Recheck in 4 wks 

## 2021-01-11 ENCOUNTER — Ambulatory Visit (INDEPENDENT_AMBULATORY_CARE_PROVIDER_SITE_OTHER): Payer: Medicare HMO | Admitting: *Deleted

## 2021-01-11 DIAGNOSIS — I4891 Unspecified atrial fibrillation: Secondary | ICD-10-CM | POA: Diagnosis not present

## 2021-01-11 DIAGNOSIS — Z5181 Encounter for therapeutic drug level monitoring: Secondary | ICD-10-CM | POA: Diagnosis not present

## 2021-01-11 LAB — POCT INR: INR: 2.3 (ref 2.0–3.0)

## 2021-01-11 NOTE — Patient Instructions (Signed)
Continue warfarin 1 tablet daily except 1/2 tablet on Sundays and Wednesdays Recheck in 6 wks 

## 2021-01-17 ENCOUNTER — Other Ambulatory Visit: Payer: Self-pay | Admitting: Family Medicine

## 2021-01-26 ENCOUNTER — Encounter: Payer: Self-pay | Admitting: Cardiology

## 2021-01-26 ENCOUNTER — Other Ambulatory Visit: Payer: Self-pay

## 2021-01-26 ENCOUNTER — Ambulatory Visit: Payer: Medicare HMO | Admitting: Cardiology

## 2021-01-26 VITALS — BP 142/72 | HR 62 | Ht 66.0 in | Wt 146.0 lb

## 2021-01-26 DIAGNOSIS — I10 Essential (primary) hypertension: Secondary | ICD-10-CM

## 2021-01-26 DIAGNOSIS — E782 Mixed hyperlipidemia: Secondary | ICD-10-CM

## 2021-01-26 DIAGNOSIS — I4891 Unspecified atrial fibrillation: Secondary | ICD-10-CM

## 2021-01-26 DIAGNOSIS — I2510 Atherosclerotic heart disease of native coronary artery without angina pectoris: Secondary | ICD-10-CM

## 2021-01-26 NOTE — Progress Notes (Signed)
Clinical Summary [name] is a 75 y.o.male seen today for follow up of the following medical problems.    1. CAD   - prior MI in 1997, prior CABG  - cath 2011 showed patent grafts. 04/2013 echo shows LVEF 60%   - myoview 12/2012 shows old inferolateral scar, no ischemia       - no chest pain. No SOB or DOE - compliant with meds   2. HTN    - lisionpril stopped by pcp per patient report. He did have some elevation of K after starting.    - compliant with meds -     3. HL   - 05/2020 TC 131 TG 65 HDL 49 LDL 68 - compliant with statin.  11/2020 TC 142 TG 40 HDL 59 LDL 73   4 COPD - followed by pcp   5. AAA screen - negative [place] 06/2016   6. Afib - new diagnosis during 11/2019 preop evaluation - occasional palpitations, just a few seconds. - he is already on metoprolol - had some hematuria about 1 month ago. - some blood toilet paper with wiping, colonscopy 11/2017 hemorroids     - eliquis was too expensive, chagned to coumadin  08/11/19 DCCV succesful, has not had afib recurrence - no recent palpitations - compliant with meds  Past Medical History:  Diagnosis Date   Anginal pain ([place])    Atrial fibrillation by electrocardiogram ([place]) 11/2019   Atrial flutter by electrocardiogram ([place]) 11/2019   Bladder cancer ([place]) 01/2020   Bladder tumor    BPH (benign prostatic hyperplasia)    CAD (coronary artery disease)    a. s/p INF MI 1997;  b. s/p CABG 2001;  c. [place] 11/2009:  3v CAD, S-PDA ok with 40% mid, S-OM ok, S-Dx ok, L-LAD ok, EF 50%;  d.  [place] 5/14:  Inferolateral scar, EF 46%, no ischemia   COPD (chronic obstructive pulmonary disease) (HCC)    Dyspnea    ETOH abuse    GERD (gastroesophageal reflux disease)    History of gout    Hypercholesterolemia    Hypertension    Myocardial infarction [facility]) 2013   Prostate cancer (HCC)    Tobacco abuse      No Known Allergies   Current Outpatient Medications  Medication Sig Dispense Refill   acetaminophen  (TYLENOL) 500 MG tablet Take 2 tablets (1,000 mg total) by mouth every 6 (six) hours as needed for moderate pain or headache. 90 tablet 0   albuterol (VENTOLIN HFA) 108 (90 Base) MCG/ACT inhaler Inhale 2 puffs into the lungs every 6 (six) hours as needed for wheezing or shortness of breath. 3 each 3   allopurinol (ZYLOPRIM) 100 MG tablet Take 1 tablet (100 mg total) by mouth daily. 90 tablet 1   amLODipine (NORVASC) 5 MG tablet Take 1 tablet (5 mg total) by mouth daily. 90 tablet 1   atorvastatin (LIPITOR) 80 MG tablet TAKE 1 TABLET EVERY DAY AT 6 PM 90 tablet 1   Budeson-Glycopyrrol-Formoterol (BREZTRI AEROSPHERE) 160-9-4.8 MCG/ACT AERO Take 2 puffs by mouth in the morning and at bedtime. Dispense 3 inhalers 3 g 4   Cholecalciferol (VITAMIN D3) 50 MCG (2000 UT) TABS Take 2,000 Units by mouth daily.      ferrous sulfate 325 (65 FE) MG tablet Take 1 tablet (325 mg total) by mouth daily with breakfast. 30 tablet 5   fluticasone (FLONASE) 50 MCG/ACT nasal spray Place 2 sprays into both nostrils daily as  needed for allergies. 48 g 2   metoprolol tartrate (LOPRESSOR) 25 MG tablet Take 0.5 tablets (12.5 mg total) by mouth 2 (two) times daily. 90 tablet 1   nitroGLYCERIN (NITROSTAT) 0.4 MG SL tablet Place 1 tablet (0.4 mg total) under the tongue every 5 (five) minutes as needed for chest pain. 75 tablet 2   omeprazole (PRILOSEC) 20 MG capsule Take 1 capsule (20 mg total) by mouth daily. 90 capsule 1   warfarin (COUMADIN) 5 MG tablet TAKE 1 TABLET EVERY DAY 90 tablet 0   No current facility-administered medications for this visit.     Past Surgical History:  Procedure Laterality Date   ANTERIOR CERVICAL DECOMPRESSION/DISCECTOMY FUSION 4 LEVELS N/A 07/21/2016   Procedure: ANTERIOR CERVICAL DECOMPRESSION/DISCECTOMY FUSION CERVICAL TWO-THREE, CERVICAL THREE-FOUR,. CERVICAL FOUR-FIVE, CERVICAL  FIVE-SIX;  Surgeon: Ashok Pall, MD;  Location: [place];  Service: Neurosurgery;  Laterality: N/A;   BACK SURGERY      CARDIAC CATHETERIZATION  2011   CARDIOVERSION N/A 06/10/2020   Procedure: CARDIOVERSION;  Surgeon: Arnoldo Lenis, MD;  [facility];  Service: Endoscopy;  Laterality: N/A;   COLONOSCOPY     in remote past, Dr. Sharlett Iles. Obtaining records.    COLONOSCOPY N/A 11/21/2017   Procedure: COLONOSCOPY;  Surgeon: Daneil Dolin, MD;  [facility];  Service: Endoscopy;  Laterality: N/A;  1:15   CORONARY ANGIOPLASTY     CORONARY ARTERY BYPASS GRAFT     ESOPHAGOGASTRODUODENOSCOPY  05/27/2012   CHY:IFOYDX esophagus, stomach and duodenum s/p dilator   POLYPECTOMY  11/21/2017   Procedure: POLYPECTOMY;  Surgeon: Daneil Dolin, MD;  [facility];  Service: Endoscopy;;  colon   PROSTATE SURGERY     TRANSURETHRAL RESECTION OF BLADDER TUMOR WITH MITOMYCIN-C Left 01/06/2020   Procedure: CYSTOSCOPY TRANSURETHRAL RESECTION OF BLADDER TUMOR,LEFT URETERSCOPY AND GEMCITABINE;  Surgeon: Irine Seal, MD;  [facility];  Service: Urology;  Laterality: Left;     No Known Allergies    Family History  Problem Relation Age of Onset   Diabetes Mother    Hypertension Father    Heart disease Brother    Diabetes Brother    Heart disease Brother    Diabetes Brother    Multiple sclerosis Daughter    Healthy Son    Colon cancer Neg Hx      Social History Mr. Crombie reports that he has been smoking cigarettes and e-cigarettes. He started smoking about 50 years ago. He has a 29.00 pack-year smoking history. He quit smokeless tobacco use about 8 years ago.  His smokeless tobacco use included snuff. Mr. Mejorado reports current alcohol use of about 6.0 standard drinks of alcohol per week.   Review of Systems CONSTITUTIONAL: No weight loss, fever, chills, weakness or fatigue.  HEENT: Eyes: No visual loss, blurred vision, double vision or yellow sclerae.No hearing loss, sneezing, congestion, runny nose or sore throat.  SKIN: No rash or itching.  CARDIOVASCULAR: per  hpi RESPIRATORY: No shortness of breath, cough or sputum.  GASTROINTESTINAL: No anorexia, nausea, vomiting or diarrhea. No abdominal pain or blood.  GENITOURINARY: No burning on urination, no polyuria NEUROLOGICAL: No headache, dizziness, syncope, paralysis, ataxia, numbness or tingling in the extremities. No change in bowel or bladder control.  MUSCULOSKELETAL: No muscle, back pain, joint pain or stiffness.  LYMPHATICS: No enlarged nodes. No history of splenectomy.  PSYCHIATRIC: No history of depression or anxiety.  ENDOCRINOLOGIC: No reports of sweating, cold or heat intolerance. No polyuria or polydipsia.  Marland Kitchen  Physical Examination Today's Vitals   01/26/21 1336  BP: (!) 142/72  Pulse: 62  Weight: 146 lb (66.2 kg)  Height: 5\' 6"  (1.676 m)   Body mass index is 23.57 kg/m.  Gen: resting comfortably, no acute distress HEENT: no scleral icterus, pupils equal round and reactive, no palptable cervical adenopathy,  CV: RRR, no m/r/g, no jvd Resp: Clear to auscultation bilaterally GI: abdomen is soft, non-tender, non-distended, normal bowel sounds, no hepatosplenomegaly MSK: extremities are warm, no edema.  Skin: warm, no rash Neuro:  no focal deficits Psych: appropriate affect   Diagnostic Studies  11/25/2009:   Cardiac Cath Findings: Left ventricular angiogram was performed in the RAO projection and   showed normal left ventricular systolic function with ejection   fraction estimated at 50%. Mild mitral regurgitation was noted.   10.Aortic root angiogram was performed and did not show enlargement of   the aortic root.   IMPRESSION:   1. Severe triple-vessel coronary artery disease.   2. Patent bypass grafts 4/4.   3. Low normal left ventricular systolic function.     12/2012 Myoview Inferolateral scar, no active ischemia, LVEF 46%.     05/01/13 Echo: LVEF 55-60%, grade II diastolic dysfunction, multiple WMAs, mild MR,     06/2016 AAA [place] No aneurysm   Assessment and  Plan  1.CAD   - has not been on ACE-I due to prior elevatiion of potassium - no rcent symptoms, continue current meds   2. HTN   - elevated today, last several clinic visits with other providers has been at goal. Monitor at this time, would titrate norvasc if needed.    3. Hyperlipidemia - contineu statin   4. Afib -eliquis too expensive, changed to coumadin - s/p DCCV 06/2020, has not had recurrence of afib since it appears. EKG today continues to show NSR - continue current meds    Arnoldo Lenis, M.D.

## 2021-01-26 NOTE — Patient Instructions (Addendum)

## 2021-02-22 ENCOUNTER — Other Ambulatory Visit: Payer: Self-pay

## 2021-02-22 ENCOUNTER — Ambulatory Visit (INDEPENDENT_AMBULATORY_CARE_PROVIDER_SITE_OTHER): Payer: Medicare HMO | Admitting: *Deleted

## 2021-02-22 DIAGNOSIS — Z5181 Encounter for therapeutic drug level monitoring: Secondary | ICD-10-CM | POA: Diagnosis not present

## 2021-02-22 DIAGNOSIS — I4891 Unspecified atrial fibrillation: Secondary | ICD-10-CM

## 2021-02-22 LAB — POCT INR: INR: 3.3 — AB (ref 2.0–3.0)

## 2021-02-22 NOTE — Patient Instructions (Signed)
Hold warfarin tonight then resume 1 tablet daily except 1/2 tablet on Sundays and Wednesdays Recheck in 6 wks 

## 2021-03-02 DIAGNOSIS — Z8551 Personal history of malignant neoplasm of bladder: Secondary | ICD-10-CM | POA: Diagnosis not present

## 2021-03-02 DIAGNOSIS — R3912 Poor urinary stream: Secondary | ICD-10-CM | POA: Diagnosis not present

## 2021-04-03 ENCOUNTER — Other Ambulatory Visit: Payer: Self-pay | Admitting: Family Medicine

## 2021-04-03 DIAGNOSIS — I10 Essential (primary) hypertension: Secondary | ICD-10-CM

## 2021-04-03 DIAGNOSIS — E785 Hyperlipidemia, unspecified: Secondary | ICD-10-CM

## 2021-04-03 DIAGNOSIS — Z8739 Personal history of other diseases of the musculoskeletal system and connective tissue: Secondary | ICD-10-CM

## 2021-04-03 DIAGNOSIS — I2510 Atherosclerotic heart disease of native coronary artery without angina pectoris: Secondary | ICD-10-CM

## 2021-04-04 ENCOUNTER — Other Ambulatory Visit: Payer: Self-pay

## 2021-04-04 ENCOUNTER — Ambulatory Visit (INDEPENDENT_AMBULATORY_CARE_PROVIDER_SITE_OTHER): Payer: Medicare HMO | Admitting: *Deleted

## 2021-04-04 DIAGNOSIS — Z5181 Encounter for therapeutic drug level monitoring: Secondary | ICD-10-CM | POA: Diagnosis not present

## 2021-04-04 DIAGNOSIS — I4891 Unspecified atrial fibrillation: Secondary | ICD-10-CM | POA: Diagnosis not present

## 2021-04-04 LAB — POCT INR: INR: 4.5 — AB (ref 2.0–3.0)

## 2021-04-04 NOTE — Patient Instructions (Signed)
Hold warfarin tonight then decrease dose to 1/2 tablet daily except 1 tablet on Mondays, Wednesdays and Fridays Recheck in 3 wks 

## 2021-04-18 ENCOUNTER — Ambulatory Visit (INDEPENDENT_AMBULATORY_CARE_PROVIDER_SITE_OTHER): Payer: Medicare HMO | Admitting: *Deleted

## 2021-04-18 DIAGNOSIS — Z5181 Encounter for therapeutic drug level monitoring: Secondary | ICD-10-CM

## 2021-04-18 DIAGNOSIS — I4891 Unspecified atrial fibrillation: Secondary | ICD-10-CM | POA: Diagnosis not present

## 2021-04-18 LAB — POCT INR: INR: 3.2 — AB (ref 2.0–3.0)

## 2021-04-18 NOTE — Patient Instructions (Signed)
Decrease warfarin to 1/2 tablet daily except 1 tablet on Tuesdays and Fridays Recheck in 3 wks

## 2021-04-27 ENCOUNTER — Other Ambulatory Visit: Payer: Self-pay

## 2021-04-27 ENCOUNTER — Ambulatory Visit (HOSPITAL_COMMUNITY)
Admission: RE | Admit: 2021-04-27 | Discharge: 2021-04-27 | Disposition: A | Discharge status: Home / Self Care | Payer: Medicare HMO | Source: Ambulatory Visit | Attending: Adult Health | Admitting: Adult Health

## 2021-04-27 DIAGNOSIS — I700 Atherosclerosis of aorta: Secondary | ICD-10-CM | POA: Diagnosis not present

## 2021-04-27 DIAGNOSIS — R918 Other nonspecific abnormal finding of lung field: Secondary | ICD-10-CM | POA: Insufficient documentation

## 2021-04-27 DIAGNOSIS — R911 Solitary pulmonary nodule: Secondary | ICD-10-CM | POA: Diagnosis not present

## 2021-05-09 ENCOUNTER — Ambulatory Visit (INDEPENDENT_AMBULATORY_CARE_PROVIDER_SITE_OTHER): Payer: Medicare HMO | Admitting: *Deleted

## 2021-05-09 ENCOUNTER — Other Ambulatory Visit: Payer: Self-pay

## 2021-05-09 DIAGNOSIS — Z5181 Encounter for therapeutic drug level monitoring: Secondary | ICD-10-CM | POA: Diagnosis not present

## 2021-05-09 DIAGNOSIS — I4891 Unspecified atrial fibrillation: Secondary | ICD-10-CM | POA: Diagnosis not present

## 2021-05-09 LAB — POCT INR: INR: 3.2 — AB (ref 2.0–3.0)

## 2021-05-09 NOTE — Patient Instructions (Signed)
Hold warfarin tonight then decrease dose to 1/2 tablet daily except 1 tablet on Fridays Recheck in 3 wks

## 2021-05-13 ENCOUNTER — Ambulatory Visit (INDEPENDENT_AMBULATORY_CARE_PROVIDER_SITE_OTHER): Payer: Medicare HMO | Admitting: Family Medicine

## 2021-05-13 ENCOUNTER — Other Ambulatory Visit: Payer: Self-pay

## 2021-05-13 ENCOUNTER — Encounter: Payer: Self-pay | Admitting: Family Medicine

## 2021-05-13 VITALS — BP 162/88 | HR 58 | Temp 97.8°F | Ht 66.0 in | Wt 145.0 lb

## 2021-05-13 DIAGNOSIS — K921 Melena: Secondary | ICD-10-CM | POA: Diagnosis not present

## 2021-05-13 DIAGNOSIS — K625 Hemorrhage of anus and rectum: Secondary | ICD-10-CM | POA: Diagnosis not present

## 2021-05-13 DIAGNOSIS — R1030 Lower abdominal pain, unspecified: Secondary | ICD-10-CM | POA: Diagnosis not present

## 2021-05-13 LAB — COAGUCHEK XS/INR WAIVED
INR: 2 — ABNORMAL HIGH (ref 0.9–1.1)
Prothrombin Time: 23.9 s

## 2021-05-13 MED ORDER — PANTOPRAZOLE SODIUM 40 MG PO TBEC: 40.0000 mg | DELAYED_RELEASE_TABLET | Freq: Every day | ORAL | 3 refills | Status: DC

## 2021-05-13 NOTE — Progress Notes (Addendum)
Subjective:  [name], male    DOB: 1945-12-26, 75 y.o.   MRN: [id number]  Patient Care Team: [name], MD as PCP - General (Family Medicine) [name], Alphonse Guild, MD as PCP - Cardiology (Cardiology) Gala Romney Cristopher Estimable, MD (Gastroenterology) [name] Alphonse Guild, MD as Consulting Physician (Cardiology) Harlen Labs, MD as Referring Physician (Optometry) Lavera Guise, [facility] as Pharmacist (Family Medicine)   Chief Complaint:  Rectal Bleeding (X2 weeks it is getting worse)   HPI: [name] is a 75 y.o. male presenting on 05/13/2021 for Rectal Bleeding (X2 weeks it is getting worse)   Pt presents today with complaints of dark red and bright red blood in stool. Onset several weeks ago, worse over last 2 weeks. Now having lower abdominal pain with epigastric burning. He has been taking omeprazole for a while without relief of symptoms.   Rectal Bleeding  The current episode started more than 1 week ago. The onset was gradual. The problem occurs frequently. The problem has been gradually worsening. The pain is moderate. The stool is described as mixed with blood and soft. There was no prior successful therapy. There was no prior unsuccessful therapy. Associated symptoms include anorexia, abdominal pain, hemorrhoids, nausea and rectal pain. Pertinent negatives include no fever, no diarrhea, no hematemesis, no vomiting, no hematuria, no vaginal bleeding, no vaginal discharge, no chest pain, no headaches, no coughing, no difficulty breathing and no rash.     Relevant past medical, surgical, family, and social history reviewed and updated as indicated.  Allergies and medications reviewed and updated. Data reviewed: Chart in Epic.   Past Medical History:  Diagnosis Date   Anginal pain ([place])    Atrial fibrillation by electrocardiogram ([place]) 11/2019   Atrial flutter by electrocardiogram [facility]) 11/2019   Bladder cancer ([place]) 01/2020   Bladder tumor    BPH (benign  prostatic hyperplasia)    CAD (coronary artery disease)    a. s/p INF MI 1997;  b. s/p CABG 2001;  c. [place] 11/2009:  3v CAD, S-PDA ok with 40% mid, S-OM ok, S-Dx ok, L-LAD ok, EF 50%;  d.  [place] 5/14:  Inferolateral scar, EF 46%, no ischemia   COPD (chronic obstructive pulmonary disease) (HCC)    Dyspnea    ETOH abuse    GERD (gastroesophageal reflux disease)    History of gout    Hypercholesterolemia    Hypertension    Myocardial infarction [facility]) 2013   Prostate cancer ([place])    Tobacco abuse     Past Surgical History:  Procedure Laterality Date   ANTERIOR CERVICAL DECOMPRESSION/DISCECTOMY FUSION 4 LEVELS N/A 07/21/2016   Procedure: ANTERIOR CERVICAL DECOMPRESSION/DISCECTOMY FUSION CERVICAL TWO-THREE, CERVICAL THREE-FOUR,. CERVICAL FOUR-FIVE, CERVICAL  FIVE-SIX;  Surgeon: Ashok Pall, MD;  Location: [place];  Service: Neurosurgery;  Laterality: N/A;   BACK SURGERY     CARDIAC CATHETERIZATION  2011   CARDIOVERSION N/A 06/10/2020   Procedure: CARDIOVERSION;  Surgeon: Arnoldo Lenis, MD;  [facility];  Service: Endoscopy;  Laterality: N/A;   COLONOSCOPY     in remote past, Dr. Sharlett Iles. Obtaining records.    COLONOSCOPY N/A 11/21/2017   Procedure: COLONOSCOPY;  Surgeon: Daneil Dolin, MD;  [facility];  Service: Endoscopy;  Laterality: N/A;  1:15   CORONARY ANGIOPLASTY     CORONARY ARTERY BYPASS GRAFT     ESOPHAGOGASTRODUODENOSCOPY  05/27/2012   VOH:[id number] esophagus, stomach and duodenum s/p dilator  POLYPECTOMY  11/21/2017   Procedure: POLYPECTOMY;  Surgeon: Daneil Dolin, MD;  [facility];  Service: Endoscopy;;  colon   PROSTATE SURGERY     TRANSURETHRAL RESECTION OF BLADDER TUMOR WITH MITOMYCIN-C Left 01/06/2020   Procedure: CYSTOSCOPY TRANSURETHRAL RESECTION OF BLADDER TUMOR,LEFT URETERSCOPY AND GEMCITABINE;  Surgeon: Irine Seal, MD;  [facility];  Service: Urology;  Laterality: Left;    Social History   Socioeconomic History    Marital status: Married    Spouse name: Not on file   Number of children: 2   Years of education: Not on file   Highest education level: Not on file  Occupational History    Comment: retired  Tobacco Use   Smoking status: Every Day    Packs/day: 0.50    Years: 58.00    Pack years: 29.00    Types: Cigarettes, E-cigarettes    Start date: 03/08/1970   Smokeless tobacco: Former    Types: Snuff    Quit date: 03/08/2012   Tobacco comments:    5-6 cigarettes smoked per day and vaping as well [facility] 10/26/20  Vaping Use   Vaping Use: Every day  Substance and Sexual Activity   Alcohol use: Yes    Alcohol/week: 6.0 standard drinks    Types: 6 Cans of beer per week    Comment: every other day he drinks two 40oz beers   Drug use: No   Sexual activity: Yes    Partners: Female  Other Topics Concern   Not on file  Social History Narrative   His brother lives with him and his wife. Daughter lives in [place], son lives nearby.   Social Determinants of Health   Financial Resource Strain: Low Risk    Difficulty of Paying Living Expenses: Not hard at all  Food Insecurity: No Food Insecurity   Worried About Charity fundraiser in the Last Year: Never true   [place] in the Last Year: Never true  Transportation Needs: No Transportation Needs   Lack of Transportation (Medical): No   Lack of Transportation (Non-Medical): No  Physical Activity: Insufficiently Active   Days of Exercise per Week: 7 days   Minutes of Exercise per Session: 20 min  Stress: No Stress Concern Present   Feeling of Stress : Not at all  Social Connections: Moderately Isolated   Frequency of Communication with Friends and Family: More than three times a week   Frequency of Social Gatherings with Friends and Family: More than three times a week   Attends Religious Services: Never   Marine scientist or Organizations: No   Attends Archivist Meetings: Never   Marital Status: Married  Human resources officer  Violence: Not At Risk   Fear of Current or Ex-Partner: No   Emotionally Abused: No   Physically Abused: No   Sexually Abused: No    Outpatient Encounter Medications as of 05/13/2021  Medication Sig   acetaminophen (TYLENOL) 500 MG tablet Take 2 tablets (1,000 mg total) by mouth every 6 (six) hours as needed for moderate pain or headache.   albuterol (VENTOLIN HFA) 108 (90 Base) MCG/ACT inhaler Inhale 2 puffs into the lungs every 6 (six) hours as needed for wheezing or shortness of breath.   allopurinol (ZYLOPRIM) 100 MG tablet TAKE 1 TABLET EVERY DAY   amLODipine (NORVASC) 5 MG tablet TAKE 1 TABLET EVERY DAY   atorvastatin (LIPITOR) 80 MG tablet TAKE 1 TABLET EVERY DAY AT 6 PM  Budeson-Glycopyrrol-Formoterol (BREZTRI AEROSPHERE) 160-9-4.8 MCG/ACT AERO Take 2 puffs by mouth in the morning and at bedtime. Dispense 3 inhalers   Cholecalciferol (VITAMIN D3) 50 MCG (2000 UT) TABS Take 2,000 Units by mouth daily.    ferrous sulfate 325 (65 FE) MG tablet Take 1 tablet (325 mg total) by mouth daily with breakfast.   fluticasone (FLONASE) 50 MCG/ACT nasal spray Place 2 sprays into both nostrils daily as needed for allergies.   metoprolol tartrate (LOPRESSOR) 25 MG tablet TAKE 1/2 TABLET TWICE DAILY   nitroGLYCERIN (NITROSTAT) 0.4 MG SL tablet Place 1 tablet (0.4 mg total) under the tongue every 5 (five) minutes as needed for chest pain.   pantoprazole (PROTONIX) 40 MG tablet Take 1 tablet (40 mg total) by mouth daily.   warfarin (COUMADIN) 5 MG tablet TAKE 1 TABLET EVERY DAY   [DISCONTINUED] omeprazole (PRILOSEC) 20 MG capsule TAKE 1 CAPSULE EVERY DAY   No facility-administered encounter medications on file as of 05/13/2021.    No Known Allergies  Review of Systems  Constitutional:  Positive for activity change and appetite change. Negative for chills, diaphoresis, fatigue, fever and unexpected weight change.  HENT: Negative.    Eyes: Negative.   Respiratory:  Negative for cough, chest  tightness and shortness of breath.   Cardiovascular:  Negative for chest pain, palpitations and leg swelling.  Gastrointestinal:  Positive for abdominal pain, anal bleeding, anorexia, hematochezia, hemorrhoids, nausea and rectal pain. Negative for abdominal distention, blood in stool, constipation, diarrhea, hematemesis and vomiting.  Endocrine: Negative.   Genitourinary:  Negative for decreased urine volume, difficulty urinating, dysuria, frequency, hematuria, urgency, vaginal bleeding and vaginal discharge.  Musculoskeletal:  Negative for arthralgias and myalgias.  Skin: Negative.  Negative for rash.  Allergic/Immunologic: Negative.   Neurological:  Negative for dizziness, weakness and headaches.  Hematological: Negative.   Psychiatric/Behavioral:  Negative for confusion, hallucinations, sleep disturbance and suicidal ideas.   All other systems reviewed and are negative.      Objective:  BP (!) 162/88   Pulse (!) 58   Temp 97.8 F (36.6 C)   Ht 5' 6"  (1.676 m)   Wt 145 lb (65.8 kg)   SpO2 98%   BMI 23.40 kg/m    Wt Readings from Last 3 Encounters:  05/13/21 145 lb (65.8 kg)  01/26/21 146 lb (66.2 kg)  12/02/20 150 lb 12.8 oz (68.4 kg)    Physical Exam Vitals and nursing note reviewed.  Constitutional:      General: He is not in acute distress.    Appearance: Normal appearance. He is well-developed, well-groomed and normal weight. He is not ill-appearing, toxic-appearing or diaphoretic.  HENT:     Head: Normocephalic and atraumatic.     Jaw: There is normal jaw occlusion.     Right Ear: Hearing normal.     Left Ear: Hearing normal.     Nose: Nose normal.     Mouth/Throat:     Lips: Pink.     Mouth: Mucous membranes are moist.     Pharynx: Oropharynx is clear. Uvula midline.  Eyes:     General: Lids are normal.     Extraocular Movements: Extraocular movements intact.     Conjunctiva/sclera: Conjunctivae normal.     Pupils: Pupils are equal, round, and reactive to  light.  Neck:     Thyroid: No thyroid mass, thyromegaly or thyroid tenderness.     Vascular: No carotid bruit or JVD.     Trachea: Trachea and phonation normal.  Cardiovascular:     Rate and Rhythm: Normal rate and regular rhythm.     Chest Wall: PMI is not displaced.     Pulses: Normal pulses.     Heart sounds: Normal heart sounds. No murmur heard.   No friction rub. No gallop.  Pulmonary:     Effort: Pulmonary effort is normal. No respiratory distress.     Breath sounds: Normal breath sounds. No wheezing.  Abdominal:     General: Bowel sounds are normal. There is no distension or abdominal bruit.     Palpations: Abdomen is soft. There is no hepatomegaly or splenomegaly.     Tenderness: There is generalized abdominal tenderness and tenderness in the epigastric area, periumbilical area and suprapubic area. There is guarding. There is no right CVA tenderness, left CVA tenderness or rebound. Negative signs include Murphy's sign, Rovsing's sign, McBurney's sign, psoas sign and obturator sign.     Hernia: No hernia is present.  Genitourinary:    Penis: Normal.      Rectum: Guaiac result positive (positive, lot [id number] R exp 12/2021). Tenderness and external hemorrhoid present. No mass or anal fissure. Normal anal tone.     Comments: Multiple external hemorrhoids, not thrombosed or actively bleeding Musculoskeletal:        General: Normal range of motion.     Cervical back: Normal range of motion and neck supple.     Right lower leg: No edema.     Left lower leg: No edema.  Lymphadenopathy:     Cervical: No cervical adenopathy.  Skin:    General: Skin is warm and dry.     Capillary Refill: Capillary refill takes less than 2 seconds.     Coloration: Skin is not cyanotic, jaundiced or pale.     Findings: No rash.  Neurological:     General: No focal deficit present.     Mental Status: He is alert and oriented to person, place, and time.     Cranial Nerves: Cranial nerves are intact.      Sensory: Sensation is intact.     Motor: Motor function is intact.     Coordination: Coordination is intact.     Gait: Gait is intact.     Deep Tendon Reflexes: Reflexes are normal and symmetric.  Psychiatric:        Attention and Perception: Attention and perception normal.        Mood and Affect: Mood and affect normal.        Speech: Speech normal.        Behavior: Behavior normal. Behavior is cooperative.        Thought Content: Thought content normal.        Cognition and Memory: Cognition and memory normal.        Judgment: Judgment normal.    Results for orders placed or performed in visit on 05/09/21  POCT INR  Result Value Ref Range   INR 3.2 (A) 2.0 - 3.0       Pertinent labs & imaging results that were available during my care of the patient were reviewed by me and considered in my medical decision making.  Assessment & Plan:  Bracken was seen today for rectal bleeding.  Diagnoses and all orders for this visit:  Rectal bleeding Lower abdominal pain [place] Ongoing and worsening. Hemoccult positive in office. Labs pending. STAT CT abd/pelvis ordered. Change Omeprazole to Protonix. Urgent referral to GI. Pt aware to report any new, worsening, or persistent symptoms. Pt aware  of symptoms which require emergent evaluation and treatment.  -     pantoprazole (PROTONIX) 40 MG tablet; Take 1 tablet (40 mg total) by mouth daily. -     CT Abdomen Pelvis W Contrast; Future -     CoaguChek XS/INR Waived -     CBC with Differential/Platelet -     CMP14+EGFR -     Ambulatory referral to Gastroenterology    Continue all other maintenance medications.  Follow up plan: Return in about 4 weeks (around 06/10/2021), or if symptoms worsen or fail to improve, for rectal bleeding.   Continue healthy lifestyle choices, including diet (rich in fruits, vegetables, and lean proteins, and low in salt and simple carbohydrates) and exercise (at least 30 minutes of moderate physical activity  daily).  Educational handout given for GI bleed  The above assessment and management plan was discussed with the patient. The patient verbalized understanding of and has agreed to the management plan. Patient is aware to call the clinic if they develop any new symptoms or if symptoms persist or worsen. Patient is aware when to return to the clinic for a follow-up visit. Patient educated on when it is appropriate to go to the emergency department.   Monia Pouch, FNP-C [facility] [phone]

## 2021-05-14 LAB — CMP14+EGFR
ALT: 19 IU/L (ref 0–44)
AST: 29 IU/L (ref 0–40)
Albumin/Globulin Ratio: 1.7 (ref 1.2–2.2)
Albumin: 4.3 g/dL (ref 3.7–4.7)
Alkaline Phosphatase: 108 IU/L (ref 44–121)
BUN/Creatinine Ratio: 10 (ref 10–24)
BUN: 10 mg/dL (ref 8–27)
Bilirubin Total: 0.5 mg/dL (ref 0.0–1.2)
CO2: 23 mmol/L (ref 20–29)
Calcium: 9.2 mg/dL (ref 8.6–10.2)
Chloride: 103 mmol/L (ref 96–106)
Creatinine, Ser: 0.98 mg/dL (ref 0.76–1.27)
Globulin, Total: 2.6 g/dL (ref 1.5–4.5)
Glucose: 90 mg/dL (ref 70–99)
Potassium: 4.4 mmol/L (ref 3.5–5.2)
Sodium: 141 mmol/L (ref 134–144)
Total Protein: 6.9 g/dL (ref 6.0–8.5)
eGFR: 81 mL/min/{1.73_m2} (ref >59)

## 2021-05-14 LAB — CBC WITH DIFFERENTIAL/PLATELET
Basophils Absolute: 0 10*3/uL (ref 0.0–0.2)
Basos: 1 %
EOS (ABSOLUTE): 0.1 10*3/uL (ref 0.0–0.4)
Eos: 2 %
Hematocrit: 37.2 % — ABNORMAL LOW (ref 37.5–51.0)
Hemoglobin: 12.7 g/dL — ABNORMAL LOW (ref 13.0–17.7)
Immature Grans (Abs): 0 10*3/uL (ref 0.0–0.1)
Immature Granulocytes: 0 %
Lymphocytes Absolute: 2.1 10*3/uL (ref 0.7–3.1)
Lymphs: 34 %
MCH: 33.7 pg — ABNORMAL HIGH (ref 26.6–33.0)
MCHC: 34.1 g/dL (ref 31.5–35.7)
MCV: 99 fL — ABNORMAL HIGH (ref 79–97)
Monocytes Absolute: 0.5 10*3/uL (ref 0.1–0.9)
Monocytes: 8 %
Neutrophils Absolute: 3.4 10*3/uL (ref 1.4–7.0)
Neutrophils: 55 %
Platelets: 233 10*3/uL (ref 150–450)
RBC: 3.77 x10E6/uL — ABNORMAL LOW (ref 4.14–5.80)
RDW: 14.1 % (ref 11.6–15.4)
WBC: 6.1 10*3/uL (ref 3.4–10.8)

## 2021-05-16 ENCOUNTER — Telehealth: Payer: Self-pay | Admitting: Family Medicine

## 2021-05-16 NOTE — Telephone Encounter (Signed)
[facility] cannot see patient until Feb 2023.  Can you see if he can be referred to Santina [facility] sooner.  Please let her know.  Regarding CT Scan, this appt is scheduled with Forestine Na for 10/12 at 11:00 - patient aware to pick up prep day before at [facility].

## 2021-05-16 NOTE — Telephone Encounter (Signed)
Patient's wife is calling to check on referral for CT scan.  States husband needs this asap because of his bleeding in stool.  Left message for Loma Sousa and patient has approval number for this test.  Please call.

## 2021-05-17 NOTE — Addendum Note (Signed)
Addended by: Baruch Gouty on: 05/17/2021 10:41 AM   Modules accepted: Orders

## 2021-05-18 ENCOUNTER — Other Ambulatory Visit: Payer: Self-pay

## 2021-05-18 ENCOUNTER — Ambulatory Visit (HOSPITAL_COMMUNITY)
Admission: RE | Admit: 2021-05-18 | Discharge: 2021-05-18 | Disposition: A | Discharge status: Home / Self Care | Payer: Medicare HMO | Source: Ambulatory Visit | Attending: Family Medicine | Admitting: Family Medicine

## 2021-05-18 ENCOUNTER — Encounter (HOSPITAL_COMMUNITY): Payer: Self-pay | Admitting: Radiology

## 2021-05-18 DIAGNOSIS — K921 Melena: Secondary | ICD-10-CM | POA: Insufficient documentation

## 2021-05-18 DIAGNOSIS — K625 Hemorrhage of anus and rectum: Secondary | ICD-10-CM | POA: Diagnosis not present

## 2021-05-18 DIAGNOSIS — R1030 Lower abdominal pain, unspecified: Secondary | ICD-10-CM | POA: Diagnosis not present

## 2021-05-18 DIAGNOSIS — R109 Unspecified abdominal pain: Secondary | ICD-10-CM | POA: Diagnosis not present

## 2021-05-18 MED ORDER — IOHEXOL 300 MG/ML  SOLN
100.0000 mL | Freq: Once | INTRAMUSCULAR | Status: AC | PRN
  Administered 2021-05-18: 100 mL via INTRAVENOUS

## 2021-05-18 NOTE — Telephone Encounter (Signed)
His apointment with [facility] is on !0/26. I can't get him in sooner anywhere. Please let him know.

## 2021-05-21 NOTE — H&P (View-Only) (Signed)
Referring Provider:Rakes, Connye Burkitt, FNP Primary Care Physician:  [name], MD Primary Gastroenterologist:  Dr. Gala Romney  Chief Complaint  Patient presents with   Rectal Bleeding    Bright red, bad about a week ago. Not as much now. Last tcs 2019   Anemia     HPI:   [name] is a 75 y.o. male presenting today at the request of Rakes, Connye Burkitt, FNP for anemia, rectal bleeding.   History significant for atrial fibrillation/atrial flutter s/p cardioversion 06/2020 on warfarin, COPD, CAD, MI s/p CABG, HTN, GERD, bladder cancer, prostate cancer.   Reviewed office visit with Darla Lesches, [place] 05/13/2021.  Patient reported several weeks of dark red and bright red blood in stool, worsening over the last 2 weeks.  Associated lower abdominal pain and epigastric burning, anorexia, nausea, rectal pain, hemorrhoids.  Taking omeprazole for a while without relief.  He had generalized abdominal tenderness on exam.  Rectal exam with guaiac positive stool, nonthrombosed external hemorrhoids without active bleeding.  Plan included labs, stat CT abdomen and pelvis, change omeprazole to Protonix 40 mg daily, urgent referral to GI.  Labs 10/7 reveal hemoglobin 12.7 (L) with macrocytic indices, platelets normal, INR 2.0, CMP entirely normal. CT A/P with contrast 05/18/2021: Moderate concentric wall thickening involving the anus, suspicious for proctitis although anal carcinoma cannot definitively be excluded.  Recommended direct visualization.   Today: Chronic history of toilet tissue hematochezia. 2 weeks ago, he developed acute worsening of his rectal bleeding with bright red blood in stool and toilet water with associated lower abdominal burning that was constant and lack of appetite.  Single episode of nausea without vomiting after eating a sausage biscuit.  Rectal bleeding was occurring daily with every bowel movement.  Also admits to loose stools during this time.  Denies constipation.  He did have a  few episodes where he passed gas and had rectal bleeding without a bowel movement.  Also with chronic intermittent rectal burning secondary to hemorrhoids.  Currently using Preparation H which is working very well.  Over the last week, bleeding has slacked off and is about back to baseline with intermittent toilet tissue hematochezia.  Lower abdominal burning has almost completely resolved.  No nausea or vomiting.  Appetite still is not great.  Bowel movements are back to baseline with 1-2 bowel movements daily.  Notes he was weak last week, but is now feeling much better.  No presyncope or syncope.  No family hx of IBD.   No abx in the last 6 months. No well water. No sick contacts.   Rare ibuprofen.  Thinks he started iron about 1 year ago.   Reflux is doing ok with Protonix. This was changed from omeprazole about 2 weeks ago. Occasional dysphagia to solid foods. Maybe once a week.  No regurgitation.  Symptoms have been going on for a while.  Prefers to hold off on evaluating this and proceed with colonoscopy only.  Also reports new onset dysuria x1 week and strong urine smell.  Prior GI evaluation:  Colonoscopy 11/21/2017: three 4 to 6 mm polyps in the rectum and at the splenic flexure, removed with a cold snare. Resected and retrieved. Non-bleeding internal hemorrhoids.  Pathology revealed tubular adenomas.  Recommended repeat colonoscopy in 5 years.  EGD 05/27/2012: Normal esophagus, stomach, and examined duodenum s/p empiric esophageal dilation.   Past Medical History:  Diagnosis Date   Anginal pain ([place])    Atrial fibrillation by electrocardiogram ([place]) 11/2019   Atrial  flutter by electrocardiogram [facility]) 11/2019   Bladder cancer ([place]) 01/2020   Bladder tumor    BPH (benign prostatic hyperplasia)    CAD (coronary artery disease)    a. s/p INF MI 1997;  b. s/p CABG 2001;  c. [place] 11/2009:  3v CAD, S-PDA ok with 40% mid, S-OM ok, S-Dx ok, L-LAD ok, EF 50%;  d.  [place] [facility] 5/14:   Inferolateral scar, EF 46%, no ischemia   COPD (chronic obstructive pulmonary disease) (HCC)    Dyspnea    ETOH abuse    GERD (gastroesophageal reflux disease)    History of gout    Hypercholesterolemia    Hypertension    Myocardial infarction [facility]) 2013   Prostate cancer ([place])    Tobacco abuse     Past Surgical History:  Procedure Laterality Date   ANTERIOR CERVICAL DECOMPRESSION/DISCECTOMY FUSION 4 LEVELS N/A 07/21/2016   Procedure: ANTERIOR CERVICAL DECOMPRESSION/DISCECTOMY FUSION CERVICAL TWO-THREE, CERVICAL THREE-FOUR,. CERVICAL FOUR-FIVE, CERVICAL  FIVE-SIX;  Surgeon: Ashok Pall, MD;  Location: [place];  Service: Neurosurgery;  Laterality: N/A;   BACK SURGERY     CARDIAC CATHETERIZATION  2011   CARDIOVERSION N/A 06/10/2020   Procedure: CARDIOVERSION;  Surgeon: Arnoldo Lenis, MD;  [facility];  Service: Endoscopy;  Laterality: N/A;   COLONOSCOPY     in remote past, Dr. Sharlett Iles. Obtaining records.    COLONOSCOPY N/A 11/21/2017   Surgeon: Daneil Dolin, MD; Three 4-6 mm polyps in the rectum and splenic flexure resected and retrieved (tubular adenomas), nonbleeding internal hemorrhoids.  Repeat in 5 years.   CORONARY ANGIOPLASTY     CORONARY ARTERY BYPASS GRAFT     ESOPHAGOGASTRODUODENOSCOPY  05/27/2012   TOI:ZTIWPY esophagus, stomach and duodenum s/p dilator   POLYPECTOMY  11/21/2017   Procedure: POLYPECTOMY;  Surgeon: Daneil Dolin, MD;  [facility];  Service: Endoscopy;;  colon   PROSTATE SURGERY     TRANSURETHRAL RESECTION OF BLADDER TUMOR WITH MITOMYCIN-C Left 01/06/2020   Procedure: CYSTOSCOPY TRANSURETHRAL RESECTION OF BLADDER TUMOR,LEFT URETERSCOPY AND GEMCITABINE;  Surgeon: Irine Seal, MD;  Location: [facility];  Service: Urology;  Laterality: Left;    Current Outpatient Medications  Medication Sig Dispense Refill   acetaminophen (TYLENOL) 500 MG tablet Take 2 tablets (1,000 mg total) by mouth every 6 (six) hours as needed for moderate  pain or headache. 90 tablet 0   albuterol (VENTOLIN HFA) 108 (90 Base) MCG/ACT inhaler Inhale 2 puffs into the lungs every 6 (six) hours as needed for wheezing or shortness of breath. 3 each 3   allopurinol (ZYLOPRIM) 100 MG tablet TAKE 1 TABLET EVERY DAY 90 tablet 0   amLODipine (NORVASC) 5 MG tablet TAKE 1 TABLET EVERY DAY 90 tablet 0   atorvastatin (LIPITOR) 80 MG tablet TAKE 1 TABLET EVERY DAY AT 6 PM 90 tablet 0   B Complex Vitamins (VITAMIN B COMPLEX PO) Take 1 tablet by mouth daily.     Budeson-Glycopyrrol-Formoterol (BREZTRI AEROSPHERE) 160-9-4.8 MCG/ACT AERO Take 2 puffs by mouth in the morning and at bedtime. Dispense 3 inhalers 3 g 4   Cholecalciferol (VITAMIN D3) 50 MCG (2000 UT) TABS Take 2,000 Units by mouth daily.      ferrous sulfate 325 (65 FE) MG tablet Take 1 tablet (325 mg total) by mouth daily with breakfast. 30 tablet 5   fluticasone (FLONASE) 50 MCG/ACT nasal spray Place 2 sprays into both nostrils daily as needed for allergies. 48 g 2   hydrocortisone (ANUSOL-HC) 2.5 %  rectal cream Place 1 application rectally 2 (two) times daily. 30 g 1   metoprolol tartrate (LOPRESSOR) 25 MG tablet TAKE 1/2 TABLET TWICE DAILY 90 tablet 0   nitroGLYCERIN (NITROSTAT) 0.4 MG SL tablet Place 1 tablet (0.4 mg total) under the tongue every 5 (five) minutes as needed for chest pain. 75 tablet 2   pantoprazole (PROTONIX) 40 MG tablet Take 1 tablet (40 mg total) by mouth daily. 30 tablet 3   warfarin (COUMADIN) 5 MG tablet TAKE 1 TABLET EVERY DAY 90 tablet 0   No current facility-administered medications for this visit.    Allergies as of 05/23/2021   (No Known Allergies)    Family History  Problem Relation Age of Onset   Diabetes Mother    Hypertension Father    Heart disease Brother    Diabetes Brother    Heart disease Brother    Diabetes Brother    Multiple sclerosis Daughter    Healthy Son    Colon cancer Neg Hx    Inflammatory bowel disease Neg Hx     Social History    Socioeconomic History   Marital status: Married    Spouse name: Not on file   Number of children: 2   Years of education: Not on file   Highest education level: Not on file  Occupational History    Comment: retired  Tobacco Use   Smoking status: Every Day    Packs/day: 0.50    Years: 58.00    Pack years: 29.00    Types: Cigarettes, E-cigarettes    Start date: 03/08/1970   Smokeless tobacco: Former    Types: Snuff    Quit date: 03/08/2012   Tobacco comments:    5-6 cigarettes smoked per day and vaping as well [facility] 10/26/20  Vaping Use   Vaping Use: Every day  Substance and Sexual Activity   Alcohol use: Not Currently    Comment: 3-5 beer per day   Drug use: No   Sexual activity: Yes    Partners: Female  Other Topics Concern   Not on file  Social History Narrative   His brother lives with him and his wife. Daughter lives in [place], son lives nearby.   Social Determinants of Health   Financial Resource Strain: Low Risk    Difficulty of Paying Living Expenses: Not hard at all  Food Insecurity: No Food Insecurity   Worried About Charity fundraiser in the Last Year: Never true   [place] in the Last Year: Never true  Transportation Needs: No Transportation Needs   Lack of Transportation (Medical): No   Lack of Transportation (Non-Medical): No  Physical Activity: Insufficiently Active   Days of Exercise per Week: 7 days   Minutes of Exercise per Session: 20 min  Stress: No Stress Concern Present   Feeling of Stress : Not at all  Social Connections: Moderately Isolated   Frequency of Communication with Friends and Family: More than three times a week   Frequency of Social Gatherings with Friends and Family: More than three times a week   Attends Religious Services: Never   Marine scientist or Organizations: No   Attends Music therapist: Never   Marital Status: Married  Human resources officer Violence: Not At Risk   Fear of Current or Ex-Partner: No    Emotionally Abused: No   Physically Abused: No   Sexually Abused: No    Review of Systems: Gen: Denies any fever, chills,  cold or flulike symptoms, presyncope, syncope. CV: Denies chest pain, heart palpitations. Resp: Admits to chronic shortness of breath with exertion.  No shortness of breath at rest. GI: See HPI GU : Admits to dysuria x1 week and strong urine smell. MS: Denies joint pain Derm: Denies rash Psych: Denies depression, anxiety Heme: See HPI  Physical Exam: BP (!) 147/84   Pulse 83   Temp (!) 97.5 F (36.4 C) (Temporal)   Ht 5\' 6"  (1.676 m)   Wt 146 lb (66.2 kg)   BMI 23.57 kg/m  General:   Alert and oriented. Pleasant and cooperative. Well-nourished and well-developed.  Head:  Normocephalic and atraumatic. Eyes:  Without icterus, sclera clear and conjunctiva pink.  Ears:  Normal auditory acuity. Lungs:  Clear to auscultation bilaterally.  Few expiratory wheezes in the upper lung fields bilaterally.  No rales or rhonchi. No distress.  Heart:  S1, S2 present without murmurs appreciated.  Abdomen:  +BS, soft, and non-distended.  Minimal TTP in the suprapubic area. No HSM noted. No guarding or rebound. No masses appreciated.  Rectal:  Deferred  Msk:  Symmetrical without gross deformities. Normal posture. Pulses:  Normal pulses noted. Neurologic:  Alert and  oriented x4;  grossly normal neurologically. Skin:  Intact without significant lesions or rashes. Cervical Nodes:  No significant cervical adenopathy. Psych:  Normal mood and affect.     Assessment: 75 year old male with history significant for atrial fibrillation/atrial flutter s/p cardioversion 06/2020 on warfarin, COPD, CAD, MI s/p CABG, HTN, GERD, bladder cancer, and prostate cancer presenting today at the request of PCP for rectal bleeding and anemia.  Also discussed GERD, dysphagia, and dysuria.  Rectal bleeding/anemia/abnormal CT anus: Chronic history of intermittent toilet tissue hematochezia in the  setting of known hemorrhoids with acute worsening rectal bleeding 2 weeks ago with associated loose stools, lower abdominal burning, decreased appetite, and weakness. No identified trigger.  Labs on 10/7 revealed hemoglobin 12.7 with macrocytic indices, down from 13.2, 6 months prior.  CMP within normal limits.  CT A/P 05/18/2021 with moderate concentric wall thickening involving the anus, suspicious for proctitis although anal carcinoma cannot definitively be excluded with recommendations for direct visualization.  His last colonoscopy was in 2019 with 3 tubular adenomas and internal hemorrhoids with recommendations to repeat in 2024.  No family history of colon cancer or IBD.  No routine NSAID use.  He does drink 3-5 beers daily.  Encouragingly, patient symptoms are now much improved. Now back to baseline of toilet tissue hematochezia intermittently.  Bowels returned to normal, lower abdominal burning almost completely resolved, weakness improving. On exam today, he has minimal TTP in the suprapubic area.  No rectal exam performed, but patient had rectal exam by PCP on 10/7 which revealed nonthrombosed external hemorrhoids.  Differentials include radiation proctitis, acute self limiting infectious process, less likely IBD, and can't rule out underlying malignancy. Bleeding exacerbated by Warfarin. Chronic toilet tissue hematochezia likely secondary to hemorrhoids. He will need colonoscopy for further evaluation. We will also update CBC and obtain iron panel.    Had originally planned to check vitamin B12 and folate due to macrocytic anemia, but labs were not covered by insurance. Per chart review, appears these labs were completed in April with low normal B12 and low folate.  PCP started patient on B complex vitamin. Orders cancelled.   Rectal burning: In the setting of hemorrhoids. Start Anusol rectal cream twice daily x7-10 days.  Dysuria:  1 week of dysuria with foul-smelling urine.  We  will check UA  and urine culture.  GERD: Chronic.  Improved after recent transition from omeprazole to pantoprazole 40 mg daily.  Dysphagia: Solid food dysphagia about once a week.  No regurgitation.  He has history of the same with good response to empiric dilation in 2013.  This is in the setting of chronic GERD, previously with breakthrough symptoms on omeprazole, but now improved with pantoprazole 40 mg daily.  Discussed EGD, but patient prefers to hold off on this for now and focus on lower GI symptoms discussed above.   Plan: CBC, iron panel, UA, urine analysis  Proceed with colonoscopy with propofol with Dr. Gala Romney ASAP. The risks, benefits, and alternatives have been discussed with the patient in detail. The patient states understanding and desires to proceed. ASA 3 We will reach out to cardiology to verify whether patient needs bridging prior to procedure.  If so, will arrange with Coumadin clinic. Hold iron x7 days prior to procedure  Advised patient to monitor for return of rectal bleeding or worsening lower abdominal pain and let us know if this occurs.  If profuse rectal bleeding, weakness, lightheadedness, proceed to the emergency room.  Anusol rectal cream twice daily x7-10 days.  Continue Protonix 40 mg daily.  Reinforced GERD diet/lifestyle.  Written instructions provided.  Limit alcohol use.  Dysphagia precautions discussed.  Recommended chopped meats, eat slowly, take small bites, chew thoroughly, drink plenty of liquids throughout meals.  If something gets stuck in his esophagus and will not come up or go down, he is to proceed to the emergency room.  Hold off on EGD per patient's request.  We will discuss again at his next visit.  Follow-up after colonoscopy.   Aliene Altes, PA-C [facility] 05/23/2021

## 2021-05-21 NOTE — Progress Notes (Signed)
Referring Provider:Rakes, Connye Burkitt, FNP Primary Care Physician:  [name], MD Primary Gastroenterologist:  Dr. Gala Romney  Chief Complaint  Patient presents with   Rectal Bleeding    Bright red, bad about a week ago. Not as much now. Last tcs 2019   Anemia     HPI:   [name] is a 75 y.o. male presenting today at the request of Rakes, Connye Burkitt, FNP for anemia, rectal bleeding.   History significant for atrial fibrillation/atrial flutter s/p cardioversion 06/2020 on warfarin, COPD, CAD, MI s/p CABG, HTN, GERD, bladder cancer, prostate cancer.   Reviewed office visit with Darla Lesches, [place] 05/13/2021.  Patient reported several weeks of dark red and bright red blood in stool, worsening over the last 2 weeks.  Associated lower abdominal pain and epigastric burning, anorexia, nausea, rectal pain, hemorrhoids.  Taking omeprazole for a while without relief.  He had generalized abdominal tenderness on exam.  Rectal exam with guaiac positive stool, nonthrombosed external hemorrhoids without active bleeding.  Plan included labs, stat CT abdomen and pelvis, change omeprazole to Protonix 40 mg daily, urgent referral to GI.  Labs 10/7 reveal hemoglobin 12.7 (L) with macrocytic indices, platelets normal, INR 2.0, CMP entirely normal. CT A/P with contrast 05/18/2021: Moderate concentric wall thickening involving the anus, suspicious for proctitis although anal carcinoma cannot definitively be excluded.  Recommended direct visualization.   Today: Chronic history of toilet tissue hematochezia. 2 weeks ago, he developed acute worsening of his rectal bleeding with bright red blood in stool and toilet water with associated lower abdominal burning that was constant and lack of appetite.  Single episode of nausea without vomiting after eating a sausage biscuit.  Rectal bleeding was occurring daily with every bowel movement.  Also admits to loose stools during this time.  Denies constipation.  He did have a  few episodes where he passed gas and had rectal bleeding without a bowel movement.  Also with chronic intermittent rectal burning secondary to hemorrhoids.  Currently using Preparation H which is working very well.  Over the last week, bleeding has slacked off and is about back to baseline with intermittent toilet tissue hematochezia.  Lower abdominal burning has almost completely resolved.  No nausea or vomiting.  Appetite still is not great.  Bowel movements are back to baseline with 1-2 bowel movements daily.  Notes he was weak last week, but is now feeling much better.  No presyncope or syncope.  No family hx of IBD.   No abx in the last 6 months. No well water. No sick contacts.   Rare ibuprofen.  Thinks he started iron about 1 year ago.   Reflux is doing ok with Protonix. This was changed from omeprazole about 2 weeks ago. Occasional dysphagia to solid foods. Maybe once a week.  No regurgitation.  Symptoms have been going on for a while.  Prefers to hold off on evaluating this and proceed with colonoscopy only.  Also reports new onset dysuria x1 week and strong urine smell.  Prior GI evaluation:  Colonoscopy 11/21/2017: three 4 to 6 mm polyps in the rectum and at the splenic flexure, removed with a cold snare. Resected and retrieved. Non-bleeding internal hemorrhoids.  Pathology revealed tubular adenomas.  Recommended repeat colonoscopy in 5 years.  EGD 05/27/2012: Normal esophagus, stomach, and examined duodenum s/p empiric esophageal dilation.   Past Medical History:  Diagnosis Date   Anginal pain ([place])    Atrial fibrillation by electrocardiogram ([place]) 11/2019   Atrial  flutter by electrocardiogram [facility]) 11/2019   Bladder cancer ([place]) 01/2020   Bladder tumor    BPH (benign prostatic hyperplasia)    CAD (coronary artery disease)    a. s/p INF MI 1997;  b. s/p CABG 2001;  c. [place] 11/2009:  3v CAD, S-PDA ok with 40% mid, S-OM ok, S-Dx ok, L-LAD ok, EF 50%;  d.  [place] [facility] 5/14:   Inferolateral scar, EF 46%, no ischemia   COPD (chronic obstructive pulmonary disease) (HCC)    Dyspnea    ETOH abuse    GERD (gastroesophageal reflux disease)    History of gout    Hypercholesterolemia    Hypertension    Myocardial infarction [facility]) 2013   Prostate cancer ([place])    Tobacco abuse     Past Surgical History:  Procedure Laterality Date   ANTERIOR CERVICAL DECOMPRESSION/DISCECTOMY FUSION 4 LEVELS N/A 07/21/2016   Procedure: ANTERIOR CERVICAL DECOMPRESSION/DISCECTOMY FUSION CERVICAL TWO-THREE, CERVICAL THREE-FOUR,. CERVICAL FOUR-FIVE, CERVICAL  FIVE-SIX;  Surgeon: Ashok Pall, MD;  Location: [place];  Service: Neurosurgery;  Laterality: N/A;   BACK SURGERY     CARDIAC CATHETERIZATION  2011   CARDIOVERSION N/A 06/10/2020   Procedure: CARDIOVERSION;  Surgeon: Arnoldo Lenis, MD;  [facility];  Service: Endoscopy;  Laterality: N/A;   COLONOSCOPY     in remote past, Dr. Sharlett Iles. Obtaining records.    COLONOSCOPY N/A 11/21/2017   Surgeon: Daneil Dolin, MD; Three 4-6 mm polyps in the rectum and splenic flexure resected and retrieved (tubular adenomas), nonbleeding internal hemorrhoids.  Repeat in 5 years.   CORONARY ANGIOPLASTY     CORONARY ARTERY BYPASS GRAFT     ESOPHAGOGASTRODUODENOSCOPY  05/27/2012   OIN:OMVEHM esophagus, stomach and duodenum s/p dilator   POLYPECTOMY  11/21/2017   Procedure: POLYPECTOMY;  Surgeon: Daneil Dolin, MD;  [facility]: [facility];  Service: Endoscopy;;  colon   PROSTATE SURGERY     TRANSURETHRAL RESECTION OF BLADDER TUMOR WITH MITOMYCIN-C Left 01/06/2020   Procedure: CYSTOSCOPY TRANSURETHRAL RESECTION OF BLADDER TUMOR,LEFT URETERSCOPY AND GEMCITABINE;  Surgeon: Irine Seal, MD;  Location: [facility];  Service: Urology;  Laterality: Left;    Current Outpatient Medications  Medication Sig Dispense Refill   acetaminophen (TYLENOL) 500 MG tablet Take 2 tablets (1,000 mg total) by mouth every 6 (six) hours as needed for moderate  pain or headache. 90 tablet 0   albuterol (VENTOLIN HFA) 108 (90 Base) MCG/ACT inhaler Inhale 2 puffs into the lungs every 6 (six) hours as needed for wheezing or shortness of breath. 3 each 3   allopurinol (ZYLOPRIM) 100 MG tablet TAKE 1 TABLET EVERY DAY 90 tablet 0   amLODipine (NORVASC) 5 MG tablet TAKE 1 TABLET EVERY DAY 90 tablet 0   atorvastatin (LIPITOR) 80 MG tablet TAKE 1 TABLET EVERY DAY AT 6 PM 90 tablet 0   B Complex Vitamins (VITAMIN B COMPLEX PO) Take 1 tablet by mouth daily.     Budeson-Glycopyrrol-Formoterol (BREZTRI AEROSPHERE) 160-9-4.8 MCG/ACT AERO Take 2 puffs by mouth in the morning and at bedtime. Dispense 3 inhalers 3 g 4   Cholecalciferol (VITAMIN D3) 50 MCG (2000 UT) TABS Take 2,000 Units by mouth daily.      ferrous sulfate 325 (65 FE) MG tablet Take 1 tablet (325 mg total) by mouth daily with breakfast. 30 tablet 5   fluticasone (FLONASE) 50 MCG/ACT nasal spray Place 2 sprays into both nostrils daily as needed for allergies. 48 g 2   hydrocortisone (ANUSOL-HC) 2.5 %  rectal cream Place 1 application rectally 2 (two) times daily. 30 g 1   metoprolol tartrate (LOPRESSOR) 25 MG tablet TAKE 1/2 TABLET TWICE DAILY 90 tablet 0   nitroGLYCERIN (NITROSTAT) 0.4 MG SL tablet Place 1 tablet (0.4 mg total) under the tongue every 5 (five) minutes as needed for chest pain. 75 tablet 2   pantoprazole (PROTONIX) 40 MG tablet Take 1 tablet (40 mg total) by mouth daily. 30 tablet 3   warfarin (COUMADIN) 5 MG tablet TAKE 1 TABLET EVERY DAY 90 tablet 0   No current facility-administered medications for this visit.    Allergies as of 05/23/2021   (No Known Allergies)    Family History  Problem Relation Age of Onset   Diabetes Mother    Hypertension Father    Heart disease Brother    Diabetes Brother    Heart disease Brother    Diabetes Brother    Multiple sclerosis Daughter    Healthy Son    Colon cancer Neg Hx    Inflammatory bowel disease Neg Hx     Social History    Socioeconomic History   Marital status: Married    Spouse name: Not on file   Number of children: 2   Years of education: Not on file   Highest education level: Not on file  Occupational History    Comment: retired  Tobacco Use   Smoking status: Every Day    Packs/day: 0.50    Years: 58.00    Pack years: 29.00    Types: Cigarettes, E-cigarettes    Start date: 03/08/1970   Smokeless tobacco: Former    Types: Snuff    Quit date: 03/08/2012   Tobacco comments:    5-6 cigarettes smoked per day and vaping as well [facility] 10/26/20  Vaping Use   Vaping Use: Every day  Substance and Sexual Activity   Alcohol use: Not Currently    Comment: 3-5 beer per day   Drug use: No   Sexual activity: Yes    Partners: Female  Other Topics Concern   Not on file  Social History Narrative   His brother lives with him and his wife. Daughter lives in [place], son lives nearby.   Social Determinants of Health   Financial Resource Strain: Low Risk    Difficulty of Paying Living Expenses: Not hard at all  Food Insecurity: No Food Insecurity   Worried About Charity fundraiser in the Last Year: Never true   [place] in the Last Year: Never true  Transportation Needs: No Transportation Needs   Lack of Transportation (Medical): No   Lack of Transportation (Non-Medical): No  Physical Activity: Insufficiently Active   Days of Exercise per Week: 7 days   Minutes of Exercise per Session: 20 min  Stress: No Stress Concern Present   Feeling of Stress : Not at all  Social Connections: Moderately Isolated   Frequency of Communication with Friends and Family: More than three times a week   Frequency of Social Gatherings with Friends and Family: More than three times a week   Attends Religious Services: Never   Marine scientist or Organizations: No   Attends Music therapist: Never   Marital Status: Married  Human resources officer Violence: Not At Risk   Fear of Current or Ex-Partner: No    Emotionally Abused: No   Physically Abused: No   Sexually Abused: No    Review of Systems: Gen: Denies any fever, chills,  cold or flulike symptoms, presyncope, syncope. CV: Denies chest pain, heart palpitations. Resp: Admits to chronic shortness of breath with exertion.  No shortness of breath at rest. GI: See HPI GU : Admits to dysuria x1 week and strong urine smell. MS: Denies joint pain Derm: Denies rash Psych: Denies depression, anxiety Heme: See HPI  Physical Exam: BP (!) 147/84   Pulse 83   Temp (!) 97.5 F (36.4 C) (Temporal)   Ht 5\' 6"  (1.676 m)   Wt 146 lb (66.2 kg)   BMI 23.57 kg/m  General:   Alert and oriented. Pleasant and cooperative. Well-nourished and well-developed.  Head:  Normocephalic and atraumatic. Eyes:  Without icterus, sclera clear and conjunctiva pink.  Ears:  Normal auditory acuity. Lungs:  Clear to auscultation bilaterally.  Few expiratory wheezes in the upper lung fields bilaterally.  No rales or rhonchi. No distress.  Heart:  S1, S2 present without murmurs appreciated.  Abdomen:  +BS, soft, and non-distended.  Minimal TTP in the suprapubic area. No HSM noted. No guarding or rebound. No masses appreciated.  Rectal:  Deferred  Msk:  Symmetrical without gross deformities. Normal posture. Pulses:  Normal pulses noted. Neurologic:  Alert and  oriented x4;  grossly normal neurologically. Skin:  Intact without significant lesions or rashes. Cervical Nodes:  No significant cervical adenopathy. Psych:  Normal mood and affect.     Assessment: 75 year old male with history significant for atrial fibrillation/atrial flutter s/p cardioversion 06/2020 on warfarin, COPD, CAD, MI s/p CABG, HTN, GERD, bladder cancer, and prostate cancer presenting today at the request of PCP for rectal bleeding and anemia.  Also discussed GERD, dysphagia, and dysuria.  Rectal bleeding/anemia/abnormal CT anus: Chronic history of intermittent toilet tissue hematochezia in the  setting of known hemorrhoids with acute worsening rectal bleeding 2 weeks ago with associated loose stools, lower abdominal burning, decreased appetite, and weakness. No identified trigger.  Labs on 10/7 revealed hemoglobin 12.7 with macrocytic indices, down from 13.2, 6 months prior.  CMP within normal limits.  CT A/P 05/18/2021 with moderate concentric wall thickening involving the anus, suspicious for proctitis although anal carcinoma cannot definitively be excluded with recommendations for direct visualization.  His last colonoscopy was in 2019 with 3 tubular adenomas and internal hemorrhoids with recommendations to repeat in 2024.  No family history of colon cancer or IBD.  No routine NSAID use.  He does drink 3-5 beers daily.  Encouragingly, patient symptoms are now much improved. Now back to baseline of toilet tissue hematochezia intermittently.  Bowels returned to normal, lower abdominal burning almost completely resolved, weakness improving. On exam today, he has minimal TTP in the suprapubic area.  No rectal exam performed, but patient had rectal exam by PCP on 10/7 which revealed nonthrombosed external hemorrhoids.  Differentials include radiation proctitis, acute self limiting infectious process, less likely IBD, and can't rule out underlying malignancy. Bleeding exacerbated by Warfarin. Chronic toilet tissue hematochezia likely secondary to hemorrhoids. He will need colonoscopy for further evaluation. We will also update CBC and obtain iron panel.    Had originally planned to check vitamin B12 and folate due to macrocytic anemia, but labs were not covered by insurance. Per chart review, appears these labs were completed in April with low normal B12 and low folate.  PCP started patient on B complex vitamin. Orders cancelled.   Rectal burning: In the setting of hemorrhoids. Start Anusol rectal cream twice daily x7-10 days.  Dysuria:  1 week of dysuria with foul-smelling urine.  We  will check UA  and urine culture.  GERD: Chronic.  Improved after recent transition from omeprazole to pantoprazole 40 mg daily.  Dysphagia: Solid food dysphagia about once a week.  No regurgitation.  He has history of the same with good response to empiric dilation in 2013.  This is in the setting of chronic GERD, previously with breakthrough symptoms on omeprazole, but now improved with pantoprazole 40 mg daily.  Discussed EGD, but patient prefers to hold off on this for now and focus on lower GI symptoms discussed above.   Plan: CBC, iron panel, UA, urine analysis  Proceed with colonoscopy with propofol with Dr. Gala Romney ASAP. The risks, benefits, and alternatives have been discussed with the patient in detail. The patient states understanding and desires to proceed. ASA 3 We will reach out to cardiology to verify whether patient needs bridging prior to procedure.  If so, will arrange with Coumadin clinic. Hold iron x7 days prior to procedure  Advised patient to monitor for return of rectal bleeding or worsening lower abdominal pain and let us know if this occurs.  If profuse rectal bleeding, weakness, lightheadedness, proceed to the emergency room.  Anusol rectal cream twice daily x7-10 days.  Continue Protonix 40 mg daily.  Reinforced GERD diet/lifestyle.  Written instructions provided.  Limit alcohol use.  Dysphagia precautions discussed.  Recommended chopped meats, eat slowly, take small bites, chew thoroughly, drink plenty of liquids throughout meals.  If something gets stuck in his esophagus and will not come up or go down, he is to proceed to the emergency room.  Hold off on EGD per patient's request.  We will discuss again at his next visit.  Follow-up after colonoscopy.   Aliene Altes, PA-C [facility] Gastroenterology 05/23/2021

## 2021-05-23 ENCOUNTER — Other Ambulatory Visit: Payer: Self-pay

## 2021-05-23 ENCOUNTER — Ambulatory Visit: Payer: Medicare HMO | Admitting: Gastroenterology

## 2021-05-23 ENCOUNTER — Telehealth: Payer: Self-pay | Admitting: Gastroenterology

## 2021-05-23 ENCOUNTER — Encounter: Payer: Self-pay | Admitting: Gastroenterology

## 2021-05-23 VITALS — BP 147/84 | HR 83 | Temp 97.5°F | Ht 66.0 in | Wt 146.0 lb

## 2021-05-23 DIAGNOSIS — R933 Abnormal findings on diagnostic imaging of other parts of digestive tract: Secondary | ICD-10-CM | POA: Diagnosis not present

## 2021-05-23 DIAGNOSIS — D649 Anemia, unspecified: Secondary | ICD-10-CM | POA: Diagnosis not present

## 2021-05-23 DIAGNOSIS — K625 Hemorrhage of anus and rectum: Secondary | ICD-10-CM | POA: Diagnosis not present

## 2021-05-23 DIAGNOSIS — K649 Unspecified hemorrhoids: Secondary | ICD-10-CM

## 2021-05-23 DIAGNOSIS — R1310 Dysphagia, unspecified: Secondary | ICD-10-CM | POA: Diagnosis not present

## 2021-05-23 DIAGNOSIS — R208 Other disturbances of skin sensation: Secondary | ICD-10-CM | POA: Diagnosis not present

## 2021-05-23 DIAGNOSIS — R1030 Lower abdominal pain, unspecified: Secondary | ICD-10-CM

## 2021-05-23 DIAGNOSIS — R300 Dysuria: Secondary | ICD-10-CM

## 2021-05-23 MED ORDER — HYDROCORTISONE (PERIANAL) 2.5 % EX CREA: 1.0000 "application " | TOPICAL_CREAM | Freq: Two times a day (BID) | CUTANEOUS | 1 refills | Status: DC

## 2021-05-23 NOTE — Telephone Encounter (Signed)
Patient with diagnosis of a fib on warfarin for anticoagulation.    Procedure: colonoscopy Date of procedure: TBD  CHA2DS2-VASc Score = 3  This indicates a 3.2% annual risk of stroke. The patient's score is based upon: CHF History: 0 HTN History: 1 Diabetes History: 0 Stroke History: 0 Vascular Disease History: 1 Age Score: 1 Gender Score: 0   CrCl 63 Platelet count 233  Per office protocol, patient can hold warfarin for 5 days prior to procedure.   Patient will not need bridging with Lovenox (enoxaparin) around procedure.

## 2021-05-23 NOTE — Patient Instructions (Addendum)
Please have labs completed at [place].  We will arrange for you to have a colonoscopy in the near future with Dr. Gala Romney. We will reach out to [name] about management of warfarin around the time of your procedure. You will need to hold iron for 7 days prior to your procedure.  Monitor for return of significant rectal bleeding or worsening lower abdominal pain and let me know if this occurs.  If you have profuse rectal bleeding, feel weak, lightheaded, dizzy, you will need to proceed to the emergency room.  Use Anusol rectal cream twice daily for 7-10 days to help with your hemorrhoids.  I have sent a prescription to your pharmacy.  Continue Protonix 40 mg daily 30 minutes before breakfast.  Recommend you limit alcohol use.  Follow a GERD diet:  Avoid fried, fatty, greasy, spicy, citrus foods. Avoid caffeine and carbonated beverages. Avoid chocolate. Try eating 4-6 small meals a day rather than 3 large meals. Do not eat within 3 hours of laying down. Prop head of bed up on wood or bricks to create a 6 inch incline.  For your trouble swallowing: Eat slowly, take small bites, chew thoroughly, and drink plenty of liquids throughout meals. Avoid tough textures. Recommend meats to be chopped finely. If something gets stuck in your esophagus and will not come up or go down, proceed to the emergency room.   We will follow-up with you after your colonoscopy.  It was a pleasure meeting you today!  Aliene Altes, PA-C [facility] Gastroenter[facility]

## 2021-05-23 NOTE — Telephone Encounter (Signed)
Attention: Preop   We would like to request holding the following medication for patient please.  Procedure: Colonoscopy  Date: TBD  Medication to hold: Warfarin x 5 days (Will patient need bridging therapy?)  Surgeon: Dr. Gala Romney  Phone: [phone]  Fax:  [phone]  Type of Anesthesia: propofol

## 2021-05-23 NOTE — Telephone Encounter (Signed)
Clinical pharmacist to review Coumadin ?

## 2021-05-24 LAB — CBC WITH DIFFERENTIAL/PLATELET
Absolute Monocytes: 502 cells/uL (ref 200–950)
Basophils Absolute: 53 cells/uL (ref 0–200)
Basophils Relative: 0.7 %
Eosinophils Absolute: 38 cells/uL (ref 15–500)
Eosinophils Relative: 0.5 %
HCT: 38.6 % (ref 38.5–50.0)
Hemoglobin: 13.2 g/dL (ref 13.2–17.1)
Lymphs Abs: 2310 cells/uL (ref 850–3900)
MCH: 34.9 pg — ABNORMAL HIGH (ref 27.0–33.0)
MCHC: 34.2 g/dL (ref 32.0–36.0)
MCV: 102.1 fL — ABNORMAL HIGH (ref 80.0–100.0)
MPV: 9.9 fL (ref 7.5–12.5)
Monocytes Relative: 6.6 %
Neutro Abs: 4697 cells/uL (ref 1500–7800)
Neutrophils Relative %: 61.8 %
Platelets: 224 10*3/uL (ref 140–400)
RBC: 3.78 10*6/uL — ABNORMAL LOW (ref 4.20–5.80)
RDW: 14.7 % (ref 11.0–15.0)
Total Lymphocyte: 30.4 %
WBC: 7.6 10*3/uL (ref 3.8–10.8)

## 2021-05-24 LAB — URINE CULTURE
MICRO NUMBER:: 12511939
Result:: NO GROWTH
SPECIMEN QUALITY:: ADEQUATE

## 2021-05-24 LAB — IRON,TIBC AND FERRITIN PANEL
%SAT: 96 % (calc) — ABNORMAL HIGH (ref 20–48)
Ferritin: 476 ng/mL — ABNORMAL HIGH (ref 24–380)
Iron: 214 ug/dL — ABNORMAL HIGH (ref 50–180)
TIBC: 224 mcg/dL (calc) — ABNORMAL LOW (ref 250–425)

## 2021-05-24 LAB — URINALYSIS
Bilirubin Urine: NEGATIVE
Glucose, UA: NEGATIVE
Hgb urine dipstick: NEGATIVE
Ketones, ur: NEGATIVE
Leukocytes,Ua: NEGATIVE
Nitrite: NEGATIVE
Specific Gravity, Urine: 1.016 (ref 1.001–1.035)
pH: 6.5 (ref 5.0–8.0)

## 2021-05-24 NOTE — Telephone Encounter (Signed)
Left a message for the patient to call back and speak to the on-call preop APP of the day 

## 2021-05-24 NOTE — Telephone Encounter (Signed)
   Name: [name]  DOB: [birth year]  MRN: [id number]   Primary Cardiologist: [name], MD  Chart reviewed as part of pre-operative protocol coverage. Patient was contacted 05/24/2021 in reference to pre-operative risk assessment for pending surgery as outlined below.  [name] was last seen on 01/26/2021 by Dr. [name].  Since that day, [name] has done well without exertional chest pain or worsening dyspnea.  Therefore, based on ACC/AHA guidelines, the patient would be at acceptable risk for the planned procedure without further cardiovascular testing.   Patient may hold Coumadin for 5 days prior to the procedure and restart as soon as possible afterward at the surgeon's discretion.  The patient was advised that if he develops new symptoms prior to surgery to contact our office to arrange for a follow-up visit, and he verbalized understanding.  I will route this recommendation to the requesting party via Epic fax function and remove from pre-op pool. Please call with questions.  [place], [place] 05/24/2021, 6:45 PM

## 2021-05-25 ENCOUNTER — Encounter: Payer: Self-pay | Admitting: *Deleted

## 2021-05-25 MED ORDER — PEG 3350-KCL-NA BICARB-NACL 420 G PO SOLR: ORAL | 0 refills | Status: DC

## 2021-05-25 NOTE — Telephone Encounter (Signed)
Faxed copy of clearance was given to [facility] for scheduling.

## 2021-05-25 NOTE — Telephone Encounter (Signed)
Reviewed. OK to hold Warfarin x 5 days prior to colonoscopy without bridging. Please proceed with scheduling colonoscopy with propofol with RMR ASAP as planned.

## 2021-05-25 NOTE — Telephone Encounter (Signed)
Called pt, LMOVM with pre-op appt details. Letter also sent to [facility]

## 2021-05-25 NOTE — Addendum Note (Signed)
Addended by: Cheron Every on: 05/25/2021 08:37 AM   Modules accepted: Orders

## 2021-05-25 NOTE — Telephone Encounter (Addendum)
Called pt, spoke with spouse [name]. Offered 10/27 for procedure but stated he has another appt that day and can't do it. Pt is scheduled for 11/3. Aware will need pre-op appt prior and will call back with this information. Also aware will send rx for prep to pharmacy (confirmed pharmacy) and will mail instructions (confirmed address). Also aware to hold iron x7 days and coumadin x 5 days.   PA done via [employer] and approved. Auth#  [id number], DOS Jun 09 2021 - Jul 09 2021

## 2021-05-30 ENCOUNTER — Encounter: Payer: Self-pay | Admitting: Family Medicine

## 2021-05-30 ENCOUNTER — Ambulatory Visit (INDEPENDENT_AMBULATORY_CARE_PROVIDER_SITE_OTHER): Payer: Medicare HMO | Admitting: Family Medicine

## 2021-05-30 ENCOUNTER — Other Ambulatory Visit: Payer: Self-pay

## 2021-05-30 VITALS — BP 131/67 | HR 69 | Temp 97.7°F | Ht 66.0 in | Wt 146.0 lb

## 2021-05-30 DIAGNOSIS — Z8739 Personal history of other diseases of the musculoskeletal system and connective tissue: Secondary | ICD-10-CM | POA: Diagnosis not present

## 2021-05-30 DIAGNOSIS — I10 Essential (primary) hypertension: Secondary | ICD-10-CM

## 2021-05-30 DIAGNOSIS — I2510 Atherosclerotic heart disease of native coronary artery without angina pectoris: Secondary | ICD-10-CM

## 2021-05-30 DIAGNOSIS — Z23 Encounter for immunization: Secondary | ICD-10-CM

## 2021-05-30 DIAGNOSIS — K649 Unspecified hemorrhoids: Secondary | ICD-10-CM

## 2021-05-30 DIAGNOSIS — Z125 Encounter for screening for malignant neoplasm of prostate: Secondary | ICD-10-CM

## 2021-05-30 DIAGNOSIS — I4891 Unspecified atrial fibrillation: Secondary | ICD-10-CM

## 2021-05-30 DIAGNOSIS — E785 Hyperlipidemia, unspecified: Secondary | ICD-10-CM | POA: Diagnosis not present

## 2021-05-30 DIAGNOSIS — J449 Chronic obstructive pulmonary disease, unspecified: Secondary | ICD-10-CM

## 2021-05-30 DIAGNOSIS — K625 Hemorrhage of anus and rectum: Secondary | ICD-10-CM

## 2021-05-30 DIAGNOSIS — E559 Vitamin D deficiency, unspecified: Secondary | ICD-10-CM

## 2021-05-30 DIAGNOSIS — Z0001 Encounter for general adult medical examination with abnormal findings: Secondary | ICD-10-CM | POA: Diagnosis not present

## 2021-05-30 LAB — URINALYSIS
Bilirubin, UA: NEGATIVE
Glucose, UA: NEGATIVE
Ketones, UA: NEGATIVE
Leukocytes,UA: NEGATIVE
Nitrite, UA: NEGATIVE
RBC, UA: NEGATIVE
Specific Gravity, UA: 1.01 (ref 1.005–1.030)
Urobilinogen, Ur: 0.2 mg/dL (ref 0.2–1.0)
pH, UA: 6 (ref 5.0–7.5)

## 2021-05-30 MED ORDER — ALLOPURINOL 100 MG PO TABS: 100.0000 mg | ORAL_TABLET | Freq: Every day | ORAL | 3 refills | Status: DC

## 2021-05-30 MED ORDER — ATORVASTATIN CALCIUM 80 MG PO TABS: ORAL_TABLET | ORAL | 3 refills | Status: DC

## 2021-05-30 MED ORDER — WARFARIN SODIUM 5 MG PO TABS: 5.0000 mg | ORAL_TABLET | Freq: Every day | ORAL | 3 refills | Status: DC

## 2021-05-30 MED ORDER — AMLODIPINE BESYLATE 5 MG PO TABS: 5.0000 mg | ORAL_TABLET | Freq: Every day | ORAL | 3 refills | Status: DC

## 2021-05-30 MED ORDER — METOPROLOL TARTRATE 25 MG PO TABS: 12.5000 mg | ORAL_TABLET | Freq: Two times a day (BID) | ORAL | 3 refills | Status: DC

## 2021-05-30 NOTE — Progress Notes (Signed)
Subjective:  [name], male    DOB: [birth year]  Age: 75 y.o. MRN: [id number]  CC: Annual Exam   HPI DAVIUS GOUDEAU presents for CPE. Rectal bleeding with lower abdominal burning sensation. GI to do colonoscopy on 11/3. Now Just some blood on toilet paper. Still taking the coumadin. Planning to hold coumadin five days prior to procedure.   Seeing Dr. Lamonte Sakai for COPD in three days. For PFT.   Depression screen [facility] 2/9 05/30/2021 05/13/2021 11/26/2020  Decreased Interest 0 0 0  Down, Depressed, Hopeless 0 0 0  PHQ - 2 Score 0 0 0  Altered sleeping - 0 -  Tired, decreased energy - 1 -  Change in appetite - 2 -  Feeling bad or failure about yourself  - 0 -  Trouble concentrating - 0 -  Moving slowly or fidgety/restless - 0 -  Suicidal thoughts - 0 -  PHQ-9 Score - 3 -  Some recent data might be hidden    History Gerlad has a past medical history of Anginal pain ([place]), Atrial fibrillation by electrocardiogram ([place]) (11/2019), Atrial flutter by electrocardiogram ([place]) (11/2019), Bladder cancer ([place]) (01/2020), Bladder tumor, BPH (benign prostatic hyperplasia), CAD (coronary artery disease), COPD (chronic obstructive pulmonary disease) (HCC), Dyspnea, ETOH abuse, GERD (gastroesophageal reflux disease), History of gout, Hypercholesterolemia, Hypertension, Myocardial infarction ([place]) (2013), Prostate cancer ([place]), and Tobacco abuse.   He has a past surgical history that includes Prostate surgery; Colonoscopy; Back surgery; Esophagogastroduodenoscopy (05/27/2012); Cardiac catheterization (2011); Coronary angioplasty; Coronary artery bypass graft; Anterior cervical decompression/discectomy fusion 4 level (N/A, 07/21/2016); Colonoscopy (N/A, 11/21/2017); polypectomy (11/21/2017); Transurethral resection of bladder tumor with mitomycin-c (Left, 01/06/2020); and Cardioversion (N/A, 06/10/2020).   His family history includes Diabetes in his brother, brother, and mother; Healthy in his  son; Heart disease in his brother and brother; Hypertension in his father; Multiple sclerosis in his daughter.He reports that he has been smoking cigarettes and e-cigarettes. He started smoking about 51 years ago. He has a 29.00 pack-year smoking history. He quit smokeless tobacco use about 9 years ago.  His smokeless tobacco use included snuff. He reports that he does not currently use alcohol. He reports that he does not use drugs.    ROS Review of Systems  Constitutional:  Negative for activity change, fatigue and unexpected weight change.  HENT:  Negative for congestion, ear pain, hearing loss, postnasal drip and trouble swallowing.   Eyes:  Negative for pain and visual disturbance.  Respiratory:  Positive for shortness of breath (occasional DOE. Rest, and/or albuterol resolves it promptly). Negative for cough and chest tightness.   Cardiovascular:  Negative for chest pain, palpitations and leg swelling.  Gastrointestinal:  Negative for abdominal distention, abdominal pain, blood in stool, constipation, diarrhea, nausea and vomiting.  Endocrine: Negative for cold intolerance, heat intolerance and polydipsia.  Genitourinary:  Negative for difficulty urinating, dysuria, flank pain, frequency and urgency.  Musculoskeletal:  Negative for arthralgias and joint swelling.  Skin:  Negative for color change, rash and wound.  Neurological:  Negative for dizziness, syncope, speech difficulty, weakness, light-headedness, numbness and headaches.  Hematological:  Does not bruise/bleed easily.  Psychiatric/Behavioral:  Negative for confusion, decreased concentration, dysphoric mood and sleep disturbance. The patient is not nervous/anxious.    Objective:  BP 131/67   Pulse 69   Temp 97.7 F (36.5 C)   Ht 5' 6"  (1.676 m)   Wt 146 lb (66.2 kg)   SpO2 97%   BMI 23.57 kg/m  BP Readings from Last 3 Encounters:  05/30/21 131/67  05/23/21 (!) 147/84  05/13/21 (!) 162/88    Wt Readings from Last 3  Encounters:  05/30/21 146 lb (66.2 kg)  05/23/21 146 lb (66.2 kg)  05/13/21 145 lb (65.8 kg)     Physical Exam Constitutional:      Appearance: He is well-developed.  HENT:     Head: Normocephalic and atraumatic.  Eyes:     Pupils: Pupils are equal, round, and reactive to light.  Neck:     Thyroid: No thyromegaly.     Trachea: No tracheal deviation.  Cardiovascular:     Rate and Rhythm: Normal rate and regular rhythm.     Heart sounds: Normal heart sounds. No murmur heard.   No friction rub. No gallop.  Pulmonary:     Breath sounds: Normal breath sounds. No wheezing or rales.  Abdominal:     General: Bowel sounds are normal. There is no distension.     Palpations: Abdomen is soft. There is no mass.     Tenderness: There is no abdominal tenderness.     Hernia: There is no hernia in the left inguinal area.  Genitourinary:    Penis: Normal.      Testes: Normal.  Musculoskeletal:        General: Normal range of motion.     Cervical back: Normal range of motion.  Lymphadenopathy:     Cervical: No cervical adenopathy.  Skin:    General: Skin is warm and dry.  Neurological:     Mental Status: He is alert and oriented to person, place, and time.      Assessment & Plan:   Aristeo was seen today for annual exam.  Diagnoses and all orders for this visit:  Chronic obstructive pulmonary disease, unspecified COPD type ([place]) -     CBC with Differential/Platelet -     CMP14+EGFR  H/O: gout -     allopurinol (ZYLOPRIM) 100 MG tablet; Take 1 tablet (100 mg total) by mouth daily. -     CBC with Differential/Platelet -     CMP14+EGFR  Essential hypertension, benign -     amLODipine (NORVASC) 5 MG tablet; Take 1 tablet (5 mg total) by mouth daily. -     CBC with Differential/Platelet -     CMP14+EGFR -     Urinalysis  Hyperlipidemia, unspecified hyperlipidemia type -     atorvastatin (LIPITOR) 80 MG tablet; TAKE 1 TABLET EVERY DAY AT 6 PM -     CBC with  Differential/Platelet -     CMP14+EGFR -     Lipid panel  Coronary artery disease involving native coronary artery of native heart without angina pectoris -     metoprolol tartrate (LOPRESSOR) 25 MG tablet; Take 0.5 tablets (12.5 mg total) by mouth 2 (two) times daily. -     CBC with Differential/Platelet -     CMP14+EGFR -     Lipid panel  Hemorrhoids, unspecified hemorrhoid type -     CBC with Differential/Platelet -     CMP14+EGFR  Rectal bleeding -     CBC with Differential/Platelet -     CMP14+EGFR  Atrial fibrillation, unspecified type (HCC) -     CBC with Differential/Platelet -     CMP14+EGFR  Vitamin D deficiency -     VITAMIN D 25 Hydroxy (Vit-D Deficiency, Fractures)  Screening for prostate cancer -     PSA, total and free  Other orders -  warfarin (COUMADIN) 5 MG tablet; Take 1 tablet (5 mg total) by mouth daily.      I have changed [place] allopurinol, amLODipine, warfarin, and metoprolol tartrate. I am also having him maintain his Vitamin D3, ferrous sulfate, acetaminophen, Breztri Aerosphere, nitroGLYCERIN, fluticasone, albuterol, pantoprazole, B Complex Vitamins (VITAMIN B COMPLEX PO), hydrocortisone, polyethylene glycol-electrolytes, and atorvastatin.  Allergies as of 05/30/2021   No Known Allergies      Medication List        Accurate as of May 30, 2021  1:44 PM. If you have any questions, ask your nurse or doctor.          acetaminophen 500 MG tablet Commonly known as: TYLENOL Take 2 tablets (1,000 mg total) by mouth every 6 (six) hours as needed for moderate pain or headache.   albuterol 108 (90 Base) MCG/ACT inhaler Commonly known as: Ventolin HFA Inhale 2 puffs into the lungs every 6 (six) hours as needed for wheezing or shortness of breath.   allopurinol 100 MG tablet Commonly known as: ZYLOPRIM Take 1 tablet (100 mg total) by mouth daily.   amLODipine 5 MG tablet Commonly known as: NORVASC Take 1 tablet (5 mg  total) by mouth daily.   atorvastatin 80 MG tablet Commonly known as: LIPITOR TAKE 1 TABLET EVERY DAY AT 6 PM   Breztri Aerosphere 160-9-4.8 MCG/ACT Aero Generic drug: Budeson-Glycopyrrol-Formoterol Take 2 puffs by mouth in the morning and at bedtime. Dispense 3 inhalers   ferrous sulfate 325 (65 FE) MG tablet Take 1 tablet (325 mg total) by mouth daily with breakfast.   fluticasone 50 MCG/ACT nasal spray Commonly known as: FLONASE Place 2 sprays into both nostrils daily as needed for allergies.   hydrocortisone 2.5 % rectal cream Commonly known as: ANUSOL-HC Place 1 application rectally 2 (two) times daily.   metoprolol tartrate 25 MG tablet Commonly known as: LOPRESSOR Take 0.5 tablets (12.5 mg total) by mouth 2 (two) times daily.   nitroGLYCERIN 0.4 MG SL tablet Commonly known as: NITROSTAT Place 1 tablet (0.4 mg total) under the tongue every 5 (five) minutes as needed for chest pain.   pantoprazole 40 MG tablet Commonly known as: PROTONIX Take 1 tablet (40 mg total) by mouth daily.   polyethylene glycol-electrolytes 420 g solution Commonly known as: NuLYTELY As directed   VITAMIN B COMPLEX PO Take 1 tablet by mouth daily.   Vitamin D3 50 MCG (2000 UT) Tabs Take 2,000 Units by mouth daily.   warfarin 5 MG tablet Commonly known as: COUMADIN Take as directed by the anticoagulation clinic. If you are unsure how to take this medication, talk to your nurse or doctor. Original instructions: Take 1 tablet (5 mg total) by mouth daily.         Follow-up: Return in about 6 months (around 11/28/2021), or if symptoms worsen or fail to improve.  [name], M.D.

## 2021-05-31 ENCOUNTER — Ambulatory Visit (INDEPENDENT_AMBULATORY_CARE_PROVIDER_SITE_OTHER): Payer: Medicare HMO | Admitting: *Deleted

## 2021-05-31 DIAGNOSIS — Z5181 Encounter for therapeutic drug level monitoring: Secondary | ICD-10-CM

## 2021-05-31 DIAGNOSIS — I4891 Unspecified atrial fibrillation: Secondary | ICD-10-CM | POA: Diagnosis not present

## 2021-05-31 LAB — CBC WITH DIFFERENTIAL/PLATELET
Basophils Absolute: 0.1 10*3/uL (ref 0.0–0.2)
Basos: 1 %
EOS (ABSOLUTE): 0.1 10*3/uL (ref 0.0–0.4)
Eos: 1 %
Hematocrit: 37.2 % — ABNORMAL LOW (ref 37.5–51.0)
Hemoglobin: 12.8 g/dL — ABNORMAL LOW (ref 13.0–17.7)
Immature Grans (Abs): 0 10*3/uL (ref 0.0–0.1)
Immature Granulocytes: 0 %
Lymphocytes Absolute: 2.1 10*3/uL (ref 0.7–3.1)
Lymphs: 32 %
MCH: 35 pg — ABNORMAL HIGH (ref 26.6–33.0)
MCHC: 34.4 g/dL (ref 31.5–35.7)
MCV: 102 fL — ABNORMAL HIGH (ref 79–97)
Monocytes Absolute: 0.4 10*3/uL (ref 0.1–0.9)
Monocytes: 7 %
Neutrophils Absolute: 3.9 10*3/uL (ref 1.4–7.0)
Neutrophils: 59 %
Platelets: 233 10*3/uL (ref 150–450)
RBC: 3.66 x10E6/uL — ABNORMAL LOW (ref 4.14–5.80)
RDW: 14.8 % (ref 11.6–15.4)
WBC: 6.6 10*3/uL (ref 3.4–10.8)

## 2021-05-31 LAB — PSA, TOTAL AND FREE
PSA, Free: <0.01 ng/mL
Prostate Specific Ag, Serum: <0.1 ng/mL (ref 0.0–4.0)

## 2021-05-31 LAB — CMP14+EGFR
ALT: 19 IU/L (ref 0–44)
AST: 33 IU/L (ref 0–40)
Albumin/Globulin Ratio: 1.8 (ref 1.2–2.2)
Albumin: 4.2 g/dL (ref 3.7–4.7)
Alkaline Phosphatase: 120 IU/L (ref 44–121)
BUN/Creatinine Ratio: 7 — ABNORMAL LOW (ref 10–24)
BUN: 6 mg/dL — ABNORMAL LOW (ref 8–27)
Bilirubin Total: 0.6 mg/dL (ref 0.0–1.2)
CO2: 24 mmol/L (ref 20–29)
Calcium: 9.2 mg/dL (ref 8.6–10.2)
Chloride: 102 mmol/L (ref 96–106)
Creatinine, Ser: 0.91 mg/dL (ref 0.76–1.27)
Globulin, Total: 2.4 g/dL (ref 1.5–4.5)
Glucose: 85 mg/dL (ref 70–99)
Potassium: 3.7 mmol/L (ref 3.5–5.2)
Sodium: 141 mmol/L (ref 134–144)
Total Protein: 6.6 g/dL (ref 6.0–8.5)
eGFR: 89 mL/min/{1.73_m2} (ref >59)

## 2021-05-31 LAB — LIPID PANEL
Chol/HDL Ratio: 2.1 ratio (ref 0.0–5.0)
Cholesterol, Total: 121 mg/dL (ref 100–199)
HDL: 58 mg/dL (ref >39)
LDL Chol Calc (NIH): 42 mg/dL (ref 0–99)
Triglycerides: 120 mg/dL (ref 0–149)
VLDL Cholesterol Cal: 21 mg/dL (ref 5–40)

## 2021-05-31 LAB — VITAMIN D 25 HYDROXY (VIT D DEFICIENCY, FRACTURES): Vit D, 25-Hydroxy: 71.2 ng/mL (ref 30.0–100.0)

## 2021-05-31 LAB — POCT INR: INR: 2 (ref 2.0–3.0)

## 2021-05-31 NOTE — Patient Instructions (Signed)
Description   -Continue to take warfarin 1/2 a tablet daily except for 1 tablet on Fridays.  -Hold warfarin 11/29-11/2 (5 day hold for colonoscopy) -Resume warfarin on 11/3  unless Dr tells you otherwise. Take an extra 1/2 tablet with your normal dose of warfarin for 2 doses.  -Recheck INR 1 week post procedure.

## 2021-05-31 NOTE — Progress Notes (Signed)
Hello [name],  Your lab result is normal and/or stable.Some minor variations that are not significant are commonly marked abnormal, but do not represent any medical problem for you.  Best regards, Nate Perri, M.D.

## 2021-06-01 ENCOUNTER — Ambulatory Visit: Payer: Medicare HMO | Admitting: Gastroenterology

## 2021-06-02 ENCOUNTER — Other Ambulatory Visit: Payer: Self-pay

## 2021-06-02 ENCOUNTER — Encounter: Payer: Medicare HMO | Admitting: Emergency Medicine

## 2021-06-02 ENCOUNTER — Ambulatory Visit: Payer: Medicare HMO | Admitting: Emergency Medicine

## 2021-06-02 ENCOUNTER — Ambulatory Visit (INDEPENDENT_AMBULATORY_CARE_PROVIDER_SITE_OTHER): Payer: Medicare HMO | Admitting: Emergency Medicine

## 2021-06-02 ENCOUNTER — Encounter: Payer: Self-pay | Admitting: Emergency Medicine

## 2021-06-02 VITALS — BP 130/78 | HR 75 | Ht 66.0 in | Wt 147.4 lb

## 2021-06-02 DIAGNOSIS — J449 Chronic obstructive pulmonary disease, unspecified: Secondary | ICD-10-CM | POA: Diagnosis not present

## 2021-06-02 DIAGNOSIS — R918 Other nonspecific abnormal finding of lung field: Secondary | ICD-10-CM

## 2021-06-02 DIAGNOSIS — J301 Allergic rhinitis due to pollen: Secondary | ICD-10-CM

## 2021-06-02 DIAGNOSIS — F17200 Nicotine dependence, unspecified, uncomplicated: Secondary | ICD-10-CM

## 2021-06-02 LAB — PULMONARY FUNCTION TEST
DL/VA % pred: 56 %
DL/VA: 2.3 ml/min/mmHg/L
DLCO cor % pred: 61 %
DLCO cor: 13.86 ml/min/mmHg
DLCO unc % pred: 61 %
DLCO unc: 13.86 ml/min/mmHg
FEF 25-75 Post: 1.26 L/sec
FEF 25-75 Pre: 1.34 L/sec
FEF2575-%Change-Post: -5 %
FEF2575-%Pred-Post: 64 %
FEF2575-%Pred-Pre: 68 %
FEV1-%Change-Post: 0 %
FEV1-%Pred-Post: 79 %
FEV1-%Pred-Pre: 79 %
FEV1-Post: 2.11 L
FEV1-Pre: 2.11 L
FEV1FVC-%Change-Post: -3 %
FEV1FVC-%Pred-Pre: 96 %
FEV6-%Change-Post: 2 %
FEV6-%Pred-Post: 87 %
FEV6-%Pred-Pre: 84 %
FEV6-Post: 2.99 L
FEV6-Pre: 2.9 L
FEV6FVC-%Change-Post: -1 %
FEV6FVC-%Pred-Post: 103 %
FEV6FVC-%Pred-Pre: 105 %
FVC-%Change-Post: 3 %
FVC-%Pred-Post: 84 %
FVC-%Pred-Pre: 82 %
FVC-Post: 3.1 L
FVC-Pre: 3 L
Post FEV1/FVC ratio: 68 %
Post FEV6/FVC ratio: 97 %
Pre FEV1/FVC ratio: 70 %
Pre FEV6/FVC Ratio: 98 %
RV % pred: 150 %
RV: 3.44 L
TLC % pred: 104 %
TLC: 6.51 L

## 2021-06-02 NOTE — Patient Instructions (Addendum)
Please continue Breztri 2 puffs twice a day.  Rinse and gargle after using. Keep your albuterol available use 2 puffs when you need it for short of breath, chest tightness, wheezing. Try using guaifenesin 600 mg (Mucinex) 1-2 times daily to help loosen mucus We reviewed your CT scan of the chest today.  Your pulmonary nodule is improved.  We will transition over into the lung cancer screening program.  Your next scan will need to be in September 2023.  We will try to get this done at [facility] Flu shot up-to-date Consider getting the new COVID-19 booster this fall Continue to work on decreasing your cigarettes. Follow with Dr. Lamonte Sakai in 12 months or sooner if you have any problems.

## 2021-06-02 NOTE — Progress Notes (Signed)
PFT done today. 

## 2021-06-02 NOTE — Assessment & Plan Note (Signed)
-   Continue Flonase nasal spray 

## 2021-06-02 NOTE — Assessment & Plan Note (Signed)
Discussed cessation with him today 

## 2021-06-02 NOTE — Assessment & Plan Note (Signed)
Pulmonary function testing only shows mild obstruction but there is a diffusion defect and hyperinflation, most consistent with an emphysematous phenotype.  He is benefiting from [place].  No flares.  Uses albuterol about once daily.  Plan to continue same.  Add Mucinex as he is having difficulty clearing thick sputum in the morning.  Please continue Breztri 2 puffs twice a day.  Rinse and gargle after using. Keep your albuterol available use 2 puffs when you need it for short of breath, chest tightness, wheezing. Try using guaifenesin 600 mg (Mucinex) 1-2 times daily to help loosen mucus Flu shot up-to-date Consider getting the new COVID-19 booster this fall Follow with Dr. Lamonte Sakai in 12 months or sooner if you have any problems.

## 2021-06-02 NOTE — Assessment & Plan Note (Signed)
His right upper lobe pulmonary nodules actually smaller, down to 4 mm from 6 mm.  Certainly reassuring.  I think we can transition him to the lung cancer screening program.  His next scan will need to be in September 2023.  He wants to get this done at [facility] if possible

## 2021-06-02 NOTE — Progress Notes (Signed)
Subjective:    [name], male    DOB: 1946/02/12, 75 y.o.   MRN: [id number]  HPI 75 year old smoker (60+ pack years, 6 cig a day) with a history of CAD/CABG, hypertension, atrial fibrillation/flutter (cardioverted 06/2020), bladder cancer (Dr Jeffie Pollock), prostate cancer (Dr Jeffie Pollock), GERD.  He has a history of COPD.  No PFT available.  He is on Trelegy in the past, about to change to [place]. Never needs his albuterol. Has am cough, has rare wheeze. He tries to stay active, but is limited by dyspnea.   He is referred today for an abnormal CT chest.  He underwent lung cancer screening CT on 07/26/2020 that I have reviewed, showed a new 6.8 mm right apical pulmonary nodule (from 05/28/2019), stable centrilobular and paraseptal emphysema and stable previously noted pulmonary nodules.  ROV 12/02/20 --[name] is 65, active smoker with CAD/CABG, hypertension, A. fib/flutter, bladder cancer, prostate cancer, GERD, COPD.  Seen for his obstructive lung disease and also for an abnormal CT scan of the chest on lung cancer screening 07/26/2020 that showed a 6.8 mm right apical pulmonary nodule.  He has been started on [employer].  He reports today that he has am cough - productive of white mucous. Has exertional SOB with walking the dog. Does get some relief from albuterol, uses 1-2x a day. Worse lately during the pollen season.  PFT were planned for today but these have not been done - doesn't look like they were scheduled.  On omeprazole qd, flonase prn He is smoking 10 cig a day.   Repeat CT chest performed 10/19/2020 reviewed by me shows a calcified right lower lobe granuloma, stable 6 mm anteromedial right apical noncalcified pulmonary nodule, no mediastinal or hilar lymphadenopathy, mild to moderate centrilobular emphysematous change with some biapical scar   ROV 06/02/21 --75 year old active smoker who follows up today for COPD and pulmonary nodule on CT scan of the chest.  Past medical history  also significant for CAD/CABG, hypertension, A. fib/flutter, GERD, rhinitis, bladder cancer and prostate cancer (Dr. Jeffie Pollock).  Last seen in April. Today he reports that he is doing well. He is able to walk his dog, sometimes has to uses his albuterol. Also occasionally at night. Her coughs in the am to clear mucous, thick white  Currently managed on Breztri, flonase, uses albuterol most days.  Still smoking about 10 cig a day  Pulmonary function testing performed today, reviewed by me, show mild obstruction without a bronchodilator response, hyperinflated lung volumes, decreased diffusion capacity consistent with emphysematous COPD  CT scan of the chest 04/27/2021 reviewed by me, shows that his anterior medial right upper lobe pulmonary nodule has decreased in size from 6 mm to 4 mm.  No hilar or mediastinal adenopathy.   Review of Systems As per HPI     Objective:   Physical Exam  Vitals:   06/02/21 1600  BP: 130/78  Pulse: 75  SpO2: 99%  Weight: 147 lb 6.4 oz (66.9 kg)  Height: 5\' 6"  (1.676 m)   Gen: Pleasant, well-nourished, in no distress,  normal affect  ENT: No lesions,  mouth clear,  oropharynx clear, no postnasal drip  Neck: No JVD, no stridor  Lungs: No use of accessory muscles, no crackles or wheezing on normal respiration, no wheeze on forced expiration  Cardiovascular: RRR, heart sounds normal, no murmur or gallops, no peripheral edema  Musculoskeletal: No deformities, no cyanosis or clubbing  Neuro: alert, awake, non focal  Skin: Warm, no lesions  or rash     Assessment & Plan:  COPD (chronic obstructive pulmonary disease) ([place]) Pulmonary function testing only shows mild obstruction but there is a diffusion defect and hyperinflation, most consistent with an emphysematous phenotype.  He is benefiting from [place].  No flares.  Uses albuterol about once daily.  Plan to continue same.  Add Mucinex as he is having difficulty clearing thick sputum in the  morning.  Please continue Breztri 2 puffs twice a day.  Rinse and gargle after using. Keep your albuterol available use 2 puffs when you need it for short of breath, chest tightness, wheezing. Try using guaifenesin 600 mg (Mucinex) 1-2 times daily to help loosen mucus Flu shot up-to-date Consider getting the new COVID-19 booster this fall Follow with Dr. Lamonte Sakai in 12 months or sooner if you have any problems.   Pulmonary nodules His right upper lobe pulmonary nodules actually smaller, down to 4 mm from 6 mm.  Certainly reassuring.  I think we can transition him to the lung cancer screening program.  His next scan will need to be in September 2023.  He wants to get this done at [facility] if possible  TOBACCO USER Discussed cessation with him today  Allergic rhinitis Continue Flonase nasal spray   Baltazar Apo, MD, PhD 06/02/2021, 4:22 PM [facility] [phone] or if no answer [phone]

## 2021-06-06 NOTE — Patient Instructions (Signed)
[name]  06/06/2021     @PREFPERIOPPHARMACY @   Your procedure is scheduled on 06/09/2021.   Report to [facility] at  1230 P.M.   Call this number if you have problems the morning of surgery:  [phone]   Remember:  Follow the diet and prep instructions given to you by the office.       Use your inhaler before you come and bring your rescue inhaler with you.    Take these medicines the morning of surgery with A SIP OF WATER        allopurinol, amlodipine, metoprolol, protonix.     Do not wear jewelry, make-up or nail polish.  Do not wear lotions, powders, or perfumes, or deodorant.  Do not shave 48 hours prior to surgery.  Men may shave face and neck.  Do not bring valuables to the hospital.  [facility] is not responsible for any belongings or valuables.  Contacts, dentures or bridgework may not be worn into surgery.  Leave your suitcase in the car.  After surgery it may be brought to your room.  For patients admitted to the hospital, discharge time will be determined by your treatment team.  Patients discharged the day of surgery will not be allowed to drive home and must have someone with them for 24 hours.    Special instructions:  DO NOT smoke tobacco or vape for 24 hours before your procedure.   Please read over the following fact sheets that you were given. Anesthesia Post-op Instructions and Care and Recovery After Surgery      Colonoscopy, Adult, Care After This sheet gives you information about how to care for yourself after your procedure. Your health care provider may also give you more specific instructions. If you have problems or questions, contact your health care provider. What can I expect after the procedure? After the procedure, it is common to have: A small amount of blood in your stool for 24 hours after the procedure. Some gas. Mild cramping or bloating of your abdomen. Follow these instructions at home: Eating and  drinking  Drink enough fluid to keep your urine pale yellow. Follow instructions from your health care provider about eating or drinking restrictions. Resume your normal diet as instructed by your health care provider. Avoid heavy or fried foods that are hard to digest. Activity Rest as told by your health care provider. Avoid sitting for a long time without moving. Get up to take short walks every 1-2 hours. This is important to improve blood flow and breathing. Ask for help if you feel weak or unsteady. Return to your normal activities as told by your health care provider. Ask your health care provider what activities are safe for you. Managing cramping and bloating  Try walking around when you have cramps or feel bloated. Apply heat to your abdomen as told by your health care provider. Use the heat source that your health care provider recommends, such as a moist heat pack or a heating pad. Place a towel between your skin and the heat source. Leave the heat on for 20-30 minutes. Remove the heat if your skin turns bright red. This is especially important if you are unable to feel pain, heat, or cold. You may have a greater risk of getting burned. General instructions If you were given a sedative during the procedure, it can affect you for several hours. Do not drive or operate machinery until your health care  provider says that it is safe. For the first 24 hours after the procedure: Do not sign important documents. Do not drink alcohol. Do your regular daily activities at a slower pace than normal. Eat soft foods that are easy to digest. Take over-the-counter and prescription medicines only as told by your health care provider. Keep all follow-up visits as told by your health care provider. This is important. Contact a health care provider if: You have blood in your stool 2-3 days after the procedure. Get help right away if you have: More than a small spotting of blood in your  stool. Large blood clots in your stool. Swelling of your abdomen. Nausea or vomiting. A fever. Increasing pain in your abdomen that is not relieved with medicine. Summary After the procedure, it is common to have a small amount of blood in your stool. You may also have mild cramping and bloating of your abdomen. If you were given a sedative during the procedure, it can affect you for several hours. Do not drive or operate machinery until your health care provider says that it is safe. Get help right away if you have a lot of blood in your stool, nausea or vomiting, a fever, or increased pain in your abdomen. This information is not intended to replace advice given to you by your health care provider. Make sure you discuss any questions you have with your health care provider. Document Revised: 07/18/2019 Document Reviewed: 02/17/2019 Elsevier Patient Education  [place] After This sheet gives you information about how to care for yourself after your procedure. Your health care provider may also give you more specific instructions. If you have problems or questions, contact your health care provider. What can I expect after the procedure? After the procedure, it is common to have: Tiredness. Forgetfulness about what happened after the procedure. Impaired judgment for important decisions. Nausea or vomiting. Some difficulty with balance. Follow these instructions at home: For the time period you were told by your health care provider:   Rest as needed. Do not participate in activities where you could fall or become injured. Do not drive or use machinery. Do not drink alcohol. Do not take sleeping pills or medicines that cause drowsiness. Do not make important decisions or sign legal documents. Do not take care of children on your own. Eating and drinking Follow the diet that is recommended by your health care provider. Drink enough fluid to  keep your urine pale yellow. If you vomit: Drink water, juice, or soup when you can drink without vomiting. Make sure you have little or no nausea before eating solid foods. General instructions Have a responsible adult stay with you for the time you are told. It is important to have someone help care for you until you are awake and alert. Take over-the-counter and prescription medicines only as told by your health care provider. If you have sleep apnea, surgery and certain medicines can increase your risk for breathing problems. Follow instructions from your health care provider about wearing your sleep device: Anytime you are sleeping, including during daytime naps. While taking prescription pain medicines, sleeping medicines, or medicines that make you drowsy. Avoid smoking. Keep all follow-up visits as told by your health care provider. This is important. Contact a health care provider if: You keep feeling nauseous or you keep vomiting. You feel light-headed. You are still sleepy or having trouble with balance after 24 hours. You develop a rash. You have a  fever. You have redness or swelling around the IV site. Get help right away if: You have trouble breathing. You have new-onset confusion at home. Summary For several hours after your procedure, you may feel tired. You may also be forgetful and have poor judgment. Have a responsible adult stay with you for the time you are told. It is important to have someone help care for you until you are awake and alert. Rest as told. Do not drive or operate machinery. Do not drink alcohol or take sleeping pills. Get help right away if you have trouble breathing, or if you suddenly become confused. This information is not intended to replace advice given to you by your health care provider. Make sure you discuss any questions you have with your health care provider. Document Revised: 04/08/2020 Document Reviewed: 06/26/2019 Elsevier Patient  Education  2022 [employer].

## 2021-06-07 ENCOUNTER — Encounter (HOSPITAL_COMMUNITY): Payer: Self-pay

## 2021-06-07 ENCOUNTER — Other Ambulatory Visit: Payer: Self-pay

## 2021-06-07 ENCOUNTER — Encounter (HOSPITAL_COMMUNITY)
Admission: RE | Admit: 2021-06-07 | Discharge: 2021-06-07 | Disposition: A | Discharge status: Home / Self Care | Payer: Medicare HMO | Source: Ambulatory Visit | Attending: Internal Medicine | Admitting: Internal Medicine

## 2021-06-09 ENCOUNTER — Ambulatory Visit (HOSPITAL_COMMUNITY): Payer: Medicare HMO | Admitting: Anesthesiology

## 2021-06-09 ENCOUNTER — Ambulatory Visit (HOSPITAL_COMMUNITY)
Admission: RE | Admit: 2021-06-09 | Discharge: 2021-06-09 | Disposition: A | Discharge status: Home / Self Care | Payer: Medicare HMO | Source: Ambulatory Visit | Attending: Internal Medicine | Admitting: Internal Medicine

## 2021-06-09 ENCOUNTER — Other Ambulatory Visit: Payer: Self-pay

## 2021-06-09 ENCOUNTER — Encounter (HOSPITAL_COMMUNITY)
Admission: RE | Disposition: A | Discharge status: Home / Self Care | Payer: Self-pay | Source: Ambulatory Visit | Attending: Internal Medicine

## 2021-06-09 ENCOUNTER — Encounter (HOSPITAL_COMMUNITY): Payer: Self-pay | Admitting: Internal Medicine

## 2021-06-09 DIAGNOSIS — I252 Old myocardial infarction: Secondary | ICD-10-CM | POA: Insufficient documentation

## 2021-06-09 DIAGNOSIS — D122 Benign neoplasm of ascending colon: Secondary | ICD-10-CM | POA: Insufficient documentation

## 2021-06-09 DIAGNOSIS — K219 Gastro-esophageal reflux disease without esophagitis: Secondary | ICD-10-CM | POA: Insufficient documentation

## 2021-06-09 DIAGNOSIS — D649 Anemia, unspecified: Secondary | ICD-10-CM | POA: Insufficient documentation

## 2021-06-09 DIAGNOSIS — Z7901 Long term (current) use of anticoagulants: Secondary | ICD-10-CM | POA: Diagnosis not present

## 2021-06-09 DIAGNOSIS — R1310 Dysphagia, unspecified: Secondary | ICD-10-CM | POA: Insufficient documentation

## 2021-06-09 DIAGNOSIS — Z8546 Personal history of malignant neoplasm of prostate: Secondary | ICD-10-CM | POA: Insufficient documentation

## 2021-06-09 DIAGNOSIS — R300 Dysuria: Secondary | ICD-10-CM | POA: Diagnosis not present

## 2021-06-09 DIAGNOSIS — D126 Benign neoplasm of colon, unspecified: Secondary | ICD-10-CM | POA: Diagnosis not present

## 2021-06-09 DIAGNOSIS — Z951 Presence of aortocoronary bypass graft: Secondary | ICD-10-CM | POA: Diagnosis not present

## 2021-06-09 DIAGNOSIS — K627 Radiation proctitis: Secondary | ICD-10-CM | POA: Insufficient documentation

## 2021-06-09 DIAGNOSIS — I2510 Atherosclerotic heart disease of native coronary artery without angina pectoris: Secondary | ICD-10-CM | POA: Insufficient documentation

## 2021-06-09 DIAGNOSIS — Y842 Radiological procedure and radiotherapy as the cause of abnormal reaction of the patient, or of later complication, without mention of misadventure at the time of the procedure: Secondary | ICD-10-CM | POA: Diagnosis not present

## 2021-06-09 DIAGNOSIS — I4891 Unspecified atrial fibrillation: Secondary | ICD-10-CM | POA: Diagnosis not present

## 2021-06-09 DIAGNOSIS — K6289 Other specified diseases of anus and rectum: Secondary | ICD-10-CM | POA: Diagnosis not present

## 2021-06-09 DIAGNOSIS — F17290 Nicotine dependence, other tobacco product, uncomplicated: Secondary | ICD-10-CM | POA: Diagnosis not present

## 2021-06-09 DIAGNOSIS — I10 Essential (primary) hypertension: Secondary | ICD-10-CM | POA: Diagnosis not present

## 2021-06-09 DIAGNOSIS — K921 Melena: Secondary | ICD-10-CM | POA: Insufficient documentation

## 2021-06-09 DIAGNOSIS — Z8551 Personal history of malignant neoplasm of bladder: Secondary | ICD-10-CM | POA: Diagnosis not present

## 2021-06-09 DIAGNOSIS — J449 Chronic obstructive pulmonary disease, unspecified: Secondary | ICD-10-CM | POA: Insufficient documentation

## 2021-06-09 DIAGNOSIS — K635 Polyp of colon: Secondary | ICD-10-CM | POA: Diagnosis not present

## 2021-06-09 DIAGNOSIS — F17210 Nicotine dependence, cigarettes, uncomplicated: Secondary | ICD-10-CM | POA: Diagnosis not present

## 2021-06-09 HISTORY — PX: POLYPECTOMY: SHX5525

## 2021-06-09 HISTORY — PX: COLONOSCOPY WITH PROPOFOL: SHX5780

## 2021-06-09 SURGERY — COLONOSCOPY WITH PROPOFOL
Anesthesia: General

## 2021-06-09 MED ORDER — PROPOFOL 10 MG/ML IV BOLUS
INTRAVENOUS | Status: DC | PRN
  Administered 2021-06-09: 100 mg via INTRAVENOUS

## 2021-06-09 MED ORDER — LACTATED RINGERS IV SOLN: INTRAVENOUS | Status: DC | PRN

## 2021-06-09 MED ORDER — PROPOFOL 500 MG/50ML IV EMUL
INTRAVENOUS | Status: DC | PRN
  Administered 2021-06-09: 150 ug/kg/min via INTRAVENOUS

## 2021-06-09 NOTE — Interval H&P Note (Signed)
History and Physical Interval Note:  06/09/2021 2:35 PM  [name]  has presented today for surgery, with the diagnosis of RB, lower abd pain, anemia, rectal burning.  The various methods of treatment have been discussed with the patient and family. After consideration of risks, benefits and other options for treatment, the patient has consented to  Procedure(s) with comments: COLONOSCOPY WITH PROPOFOL (N/A) - 2:00pm as a surgical intervention.  The patient's history has been reviewed, patient examined, no change in status, stable for surgery.  I have reviewed the patient's chart and labs.  Questions were answered to the patient's satisfaction.     Moss Berry  No change.  Rectal bleeding seems to have cleared up after the sentinel episode.  Coumadin held x5 days.  Diagnostic colonoscopy today per plan.  The risks, benefits, limitations, alternatives and imponderables have been reviewed with the patient. Questions have been answered. All parties are agreeable.

## 2021-06-09 NOTE — Transfer of Care (Signed)
Immediate Anesthesia Transfer of Care Note  Patient: [name]  Procedure(s) Performed: COLONOSCOPY WITH PROPOFOL POLYPECTOMY  Patient Location: Short Stay  Anesthesia Type:General  Level of Consciousness: awake  Airway & Oxygen Therapy: Patient Spontanous Breathing  Post-op Assessment: Report given to RN  Post vital signs: Reviewed  Last Vitals:  Vitals Value Taken Time  BP 108/71 06/09/21 1505  Temp 36.9 C 06/09/21 1505  Pulse 53 06/09/21 1505  Resp 18 06/09/21 1505  SpO2 100 % 06/09/21 1505    Last Pain:  Vitals:   06/09/21 1505  TempSrc: Oral  PainSc: 0-No pain      Patients Stated Pain Goal: 8 (87/[id number]/11 [id number])  Complications: No notable events documented.

## 2021-06-09 NOTE — Anesthesia Postprocedure Evaluation (Signed)
Anesthesia Post Note  Patient: [name]  Procedure(s) Performed: COLONOSCOPY WITH PROPOFOL POLYPECTOMY  Patient location during evaluation: Short Stay Anesthesia Type: General Level of consciousness: awake Pain management: pain level controlled Vital Signs Assessment: post-procedure vital signs reviewed and stable Respiratory status: spontaneous breathing Cardiovascular status: blood pressure returned to baseline Postop Assessment: no apparent nausea or vomiting Anesthetic complications: no   No notable events documented.   Last Vitals:  Vitals:   06/09/21 1349 06/09/21 1505  BP:  108/71  Pulse: 64 (!) 53  Resp: 20 18  Temp:  36.9 C  SpO2:  100%    Last Pain:  Vitals:   06/09/21 1505  TempSrc: Oral  PainSc: 0-No pain                 Fadi Menter

## 2021-06-09 NOTE — Anesthesia Preprocedure Evaluation (Signed)
Anesthesia Evaluation  Patient identified by MRN, date of birth, ID band Patient awake    Reviewed: Allergy & Precautions, H&P , NPO status , Patient's Chart, lab work & pertinent test results, reviewed documented beta blocker date and time   Airway Mallampati: II  TM Distance: >3 FB Neck ROM: limited    Dental no notable dental hx.    Pulmonary shortness of breath, COPD, Current Smoker and Patient abstained from smoking.,    Pulmonary exam normal breath sounds clear to auscultation       Cardiovascular Exercise Tolerance: Good hypertension, + CAD and + Past MI   Rhythm:regular Rate:Normal     Neuro/Psych  Neuromuscular disease negative psych ROS   GI/Hepatic Neg liver ROS, GERD  Medicated,  Endo/Other  negative endocrine ROS  Renal/GU negative Renal ROS  negative genitourinary   Musculoskeletal   Abdominal   Peds  Hematology  (+) Blood dyscrasia, anemia ,   Anesthesia Other Findings   Reproductive/Obstetrics negative OB ROS                             Anesthesia Physical Anesthesia Plan  ASA: 3  Anesthesia Plan: General   Post-op Pain Management:    Induction:   PONV Risk Score and Plan: Propofol infusion  Airway Management Planned:   Additional Equipment:   Intra-op Plan:   Post-operative Plan:   Informed Consent: I have reviewed the patients History and Physical, chart, labs and discussed the procedure including the risks, benefits and alternatives for the proposed anesthesia with the patient or authorized representative who has indicated his/her understanding and acceptance.     Dental Advisory Given  Plan Discussed with: CRNA  Anesthesia Plan Comments:         Anesthesia Quick Evaluation

## 2021-06-09 NOTE — Op Note (Signed)
[facility] Patient Name: [name] Procedure Date: 06/09/2021 2:21 PM MRN: [id number] Date of Birth: 03/24/1946 Attending MD: Norvel Richards , MD CSN: [id number] Age: 75 Admit Type: Outpatient Procedure:                Colonoscopy Indications:              Hematochezia Providers:                Norvel Richards, MD, Janeece Riggers, RN, Dereck Leep, Technician Referring MD:              Medicines:                Propofol per Anesthesia Complications:            No immediate complications. Estimated Blood Loss:     Estimated blood loss was minimal. Procedure:                Pre-Anesthesia Assessment:                           - Prior to the procedure, a History and Physical                            was performed, and patient medications and                            allergies were reviewed. The patient's tolerance of                            previous anesthesia was also reviewed. The risks                            and benefits of the procedure and the sedation                            options and risks were discussed with the patient.                            All questions were answered, and informed consent                            was obtained. Prior Anticoagulants: The patient                            last took Coumadin (warfarin) 4 days prior to the                            procedure. ASA Grade Assessment: III - A patient                            with severe systemic disease. After reviewing the  risks and benefits, the patient was deemed in                            satisfactory condition to undergo the procedure.                           After obtaining informed consent, the colonoscope                            was passed under direct vision. Throughout the                            procedure, the patient's blood pressure, pulse, and                            oxygen saturations  were monitored continuously. The                            [id number]) scope was introduced through the                            anus and advanced to the the cecum, identified by                            appendiceal orifice and ileocecal valve. The                            colonoscopy was performed without difficulty. The                            patient tolerated the procedure well. The quality                            of the bowel preparation was adequate. Scope In: 2:44:16 PM Scope Out: 2:55:19 PM Scope Withdrawal Time: 0 hours 8 minutes 54 seconds  Total Procedure Duration: 0 hours 11 minutes 3 seconds  Findings:      The perianal and digital rectal examinations were normal.      Three semi-pedunculated polyps were found in the ascending colon. The       polyps were 3 to 7 mm in size. These polyps were removed with a cold       snare. Resection and retrieval were complete. Estimated blood loss was       minimal. very minimal neovascular changes distal rectum. Otherwise,      The exam was otherwise without abnormality on direct and retroflexion       views. Impression:               - Three 3 to 7 mm polyps in the ascending colon,                            removed with a cold snare. Resected and retrieved.                           - The examination was otherwise normal  on direct                            and retroflexion views. Mild radiation proctitis                            changes.                           - Patient states bleeding has resolved on its own. Moderate Sedation:      Moderate (conscious) sedation was personally administered by an       anesthesia professional. The following parameters were monitored: oxygen       saturation, heart rate, blood pressure, respiratory rate, EKG, adequacy       of pulmonary ventilation, and response to care. Recommendation:           - Patient has a contact number available for                             emergencies. The signs and symptoms of potential                            delayed complications were discussed with the                            patient. Return to normal activities tomorrow.                            Written discharge instructions were provided to the                            patient.                           - Resume previous diet.                           - Continue present medications.                           - Repeat colonoscopy date to be determined after                            pending pathology results are reviewed for                            surveillance.                           - Return to GI office in 3 months. Procedure Code(s):        --- Professional ---                           [phone], Colonoscopy, flexible; with removal of                            tumor(s), polyp(s), or  other lesion(s) by snare                            technique Diagnosis Code(s):        --- Professional ---                           K63.5, Polyp of colon                           K92.1, Melena (includes Hematochezia) CPT copyright 2019 American Medical Association. All rights reserved. The codes documented in this report are preliminary and upon coder review may  be revised to meet current compliance requirements. [name]. Ralph Altman, MD Norvel Richards, MD 06/09/2021 3:08:52 PM This report has been signed electronically. Number of Addenda: 0

## 2021-06-09 NOTE — Discharge Instructions (Signed)
  Colonoscopy Discharge Instructions  Read the instructions outlined below and refer to this sheet in the next few weeks. These discharge instructions provide you with general information on caring for yourself after you leave the hospital. Your doctor may also give you specific instructions. While your treatment has been planned according to the most current medical practices available, unavoidable complications occasionally occur. If you have any problems or questions after discharge, call Dr. Gala Romney at [phone]. ACTIVITY You may resume your regular activity, but move at a slower pace for the next 24 hours.  Take frequent rest periods for the next 24 hours.  Walking will help get rid of the air and reduce the bloated feeling in your belly (abdomen).  No driving for 24 hours (because of the medicine (anesthesia) used during the test).   Do not sign any important legal documents or operate any machinery for 24 hours (because of the anesthesia used during the test).  NUTRITION Drink plenty of fluids.  You may resume your normal diet as instructed by your doctor.  Begin with a light meal and progress to your normal diet. Heavy or fried foods are harder to digest and may make you feel sick to your stomach (nauseated).  Avoid alcoholic beverages for 24 hours or as instructed.  MEDICATIONS You may resume your normal medications unless your doctor tells you otherwise.  WHAT YOU CAN EXPECT TODAY Some feelings of bloating in the abdomen.  Passage of more gas than usual.  Spotting of blood in your stool or on the toilet paper.  IF YOU HAD POLYPS REMOVED DURING THE COLONOSCOPY: No aspirin products for 7 days or as instructed.  No alcohol for 7 days or as instructed.  Eat a soft diet for the next 24 hours.  FINDING OUT THE RESULTS OF YOUR TEST Not all test results are available during your visit. If your test results are not back during the visit, make an appointment with your caregiver to find out the  results. Do not assume everything is normal if you have not heard from your caregiver or the medical facility. It is important for you to follow up on all of your test results.  SEEK IMMEDIATE MEDICAL ATTENTION IF: You have more than a spotting of blood in your stool.  Your belly is swollen (abdominal distention).  You are nauseated or vomiting.  You have a temperature over 101.  You have abdominal pain or discomfort that is severe or gets worse throughout the day.   3 polyps removed in your colon today  Resume Coumadin today  Office visit with [place] in 3 months  Further recommendations to follow pending review of pathology report  At patient request, I called [facility] at [phone] findings

## 2021-06-10 NOTE — Addendum Note (Signed)
Addendum  created 06/10/21 1113 by Jonna Munro, CRNA   Charge Capture section accepted

## 2021-06-16 ENCOUNTER — Other Ambulatory Visit: Payer: Self-pay

## 2021-06-16 ENCOUNTER — Ambulatory Visit (INDEPENDENT_AMBULATORY_CARE_PROVIDER_SITE_OTHER): Payer: Medicare HMO | Admitting: *Deleted

## 2021-06-16 DIAGNOSIS — Z5181 Encounter for therapeutic drug level monitoring: Secondary | ICD-10-CM

## 2021-06-16 DIAGNOSIS — I4891 Unspecified atrial fibrillation: Secondary | ICD-10-CM

## 2021-06-16 LAB — SURGICAL PATHOLOGY

## 2021-06-16 LAB — POCT INR: INR: 1.7 — AB (ref 2.0–3.0)

## 2021-06-16 NOTE — Patient Instructions (Signed)
-  Take warfarin 1 tablet tonight then resume 1/2 a tablet daily except for 1 tablet on Fridays.  -Recheck INR 2 weeks

## 2021-06-17 ENCOUNTER — Encounter: Payer: Self-pay | Admitting: Internal Medicine

## 2021-06-20 ENCOUNTER — Encounter (HOSPITAL_COMMUNITY): Payer: Self-pay | Admitting: Internal Medicine

## 2021-06-29 ENCOUNTER — Ambulatory Visit (INDEPENDENT_AMBULATORY_CARE_PROVIDER_SITE_OTHER): Payer: Medicare HMO | Admitting: *Deleted

## 2021-06-29 DIAGNOSIS — Z5181 Encounter for therapeutic drug level monitoring: Secondary | ICD-10-CM | POA: Diagnosis not present

## 2021-06-29 DIAGNOSIS — I4891 Unspecified atrial fibrillation: Secondary | ICD-10-CM | POA: Diagnosis not present

## 2021-06-29 LAB — POCT INR: INR: 1.1 — AB (ref 2.0–3.0)

## 2021-06-29 NOTE — Patient Instructions (Signed)
Description   Take 1 tablets of warfarin today and 1 tablet tomorrow, then start taking warfarin 1/2 a tablet daily except for 1 tablet on Wednesdays and Fridays. Recheck INR in 1 week.

## 2021-07-07 ENCOUNTER — Ambulatory Visit (INDEPENDENT_AMBULATORY_CARE_PROVIDER_SITE_OTHER): Payer: Medicare HMO | Admitting: *Deleted

## 2021-07-07 DIAGNOSIS — I4891 Unspecified atrial fibrillation: Secondary | ICD-10-CM

## 2021-07-07 DIAGNOSIS — Z5181 Encounter for therapeutic drug level monitoring: Secondary | ICD-10-CM | POA: Diagnosis not present

## 2021-07-07 LAB — POCT INR: INR: 1.7 — AB (ref 2.0–3.0)

## 2021-07-07 NOTE — Patient Instructions (Signed)
Take 1 tablet of warfarin today then increase dose to 1/2 a tablet daily except for 1 tablet on Mondays, Wednesdays and Fridays. Recheck INR in 2 weeks.

## 2021-07-19 ENCOUNTER — Ambulatory Visit (INDEPENDENT_AMBULATORY_CARE_PROVIDER_SITE_OTHER): Payer: Medicare HMO | Admitting: *Deleted

## 2021-07-19 DIAGNOSIS — Z5181 Encounter for therapeutic drug level monitoring: Secondary | ICD-10-CM

## 2021-07-19 DIAGNOSIS — I4891 Unspecified atrial fibrillation: Secondary | ICD-10-CM | POA: Diagnosis not present

## 2021-07-19 LAB — POCT INR: INR: 2.3 (ref 2.0–3.0)

## 2021-07-19 NOTE — Patient Instructions (Signed)
Continue warfarin 1/2 a tablet daily except for 1 tablet on Mondays, Wednesdays and Fridays. Recheck INR in 4 weeks.

## 2021-08-02 ENCOUNTER — Ambulatory Visit: Payer: Medicare HMO | Admitting: Cardiology

## 2021-08-02 ENCOUNTER — Encounter: Payer: Self-pay | Admitting: Cardiology

## 2021-08-02 ENCOUNTER — Other Ambulatory Visit: Payer: Self-pay

## 2021-08-02 VITALS — BP 122/70 | HR 73 | Ht 66.0 in | Wt 144.4 lb

## 2021-08-02 DIAGNOSIS — I10 Essential (primary) hypertension: Secondary | ICD-10-CM

## 2021-08-02 DIAGNOSIS — J441 Chronic obstructive pulmonary disease with (acute) exacerbation: Secondary | ICD-10-CM | POA: Diagnosis not present

## 2021-08-02 DIAGNOSIS — I2510 Atherosclerotic heart disease of native coronary artery without angina pectoris: Secondary | ICD-10-CM

## 2021-08-02 DIAGNOSIS — I4891 Unspecified atrial fibrillation: Secondary | ICD-10-CM | POA: Diagnosis not present

## 2021-08-02 DIAGNOSIS — E782 Mixed hyperlipidemia: Secondary | ICD-10-CM | POA: Diagnosis not present

## 2021-08-02 MED ORDER — AZITHROMYCIN 250 MG PO TABS: ORAL_TABLET | ORAL | 0 refills | Status: AC

## 2021-08-02 MED ORDER — PREDNISONE 20 MG PO TABS: 40.0000 mg | ORAL_TABLET | Freq: Every day | ORAL | 0 refills | Status: DC

## 2021-08-02 NOTE — Patient Instructions (Signed)
Medication Instructions:  Begin Z-pack & Prednisone 40mg  x 5 days only Continue all other medications.     Labwork: none  Testing/Procedures: none  Follow-Up: 6 months   Any Other Special Instructions Will Be Listed Below (If Applicable).   If you need a refill on your cardiac medications before your next appointment, please call your pharmacy.

## 2021-08-02 NOTE — Progress Notes (Signed)
Clinical Summary Mr. Sava is a 75 y.o.male seen today for follow up of the following medical problems.    1. CAD   - prior MI in 1997, prior CABG  - cath 2011 showed patent grafts. 04/2013 echo shows LVEF 60%   - myoview 12/2012 shows old inferolateral scar, no ischemia     - no recent chest pain. Recent SOB in association with cough, congestion. Prior to that was doing well.       2. HTN    - lisionpril stopped by pcp per patient report. He did have some elevation of K after starting.     - compliant with meds   3. HL   - 05/2020 TC 131 TG 65 HDL 49 LDL 68 11/2020 TC 142 TG 40 HDL 59 LDL 73  05/2021 TC 121 TG 120 HDL 58 LDL 42    4. COPD - followed by pcp - significant cough starting Friday   5. AAA screen - negative [place] 06/2016   6. Afib - new diagnosis during 11/2019 preop evaluation - occasional palpitations, just a few seconds. - he is already on metoprolol - had some hematuria about 1 month ago. - some blood toilet paper with wiping, colonscopy 11/2017 hemorroids     - eliquis was too expensive, chagned to coumadin  08/11/19 DCCV succesful, has not had afib recurrence - no recent palpitations - compliant with meds  - no recent palpitations - rare infrequent blood on toilet paper at times. Followed by GI with recent colonscopy.    Past Medical History:  Diagnosis Date   Anginal pain ([place])    Atrial fibrillation by electrocardiogram ([place]) 11/2019   Atrial flutter by electrocardiogram [facility]) 11/2019   Bladder cancer ([place]) 01/2020   Bladder tumor    BPH (benign prostatic hyperplasia)    CAD (coronary artery disease)    a. s/p INF MI 1997;  b. s/p CABG 2001;  c. [place] 11/2009:  3v CAD, S-PDA ok with 40% mid, S-OM ok, S-Dx ok, L-LAD ok, EF 50%;  d.  Lex MV 5/14:  Inferolateral scar, EF 46%, no ischemia   COPD (chronic obstructive pulmonary disease) (HCC)    Dyspnea    ETOH abuse    GERD (gastroesophageal reflux disease)    History of gout     Hypercholesterolemia    Hypertension    Myocardial infarction [facility]) 2013   Prostate cancer (H[facility])    Tobacco abuse      No Known Allergies   Current Outpatient Medications  Medication Sig Dispense Refill   acetaminophen (TYLENOL) 500 MG tablet Take 2 tablets (1,000 mg total) by mouth every 6 (six) hours as needed for moderate pain or headache. 90 tablet 0   albuterol (VENTOLIN HFA) 108 (90 Base) MCG/ACT inhaler Inhale 2 puffs into the lungs every 6 (six) hours as needed for wheezing or shortness of breath. 3 each 3   allopurinol (ZYLOPRIM) 100 MG tablet Take 1 tablet (100 mg total) by mouth daily. 90 tablet 3   amLODipine (NORVASC) 5 MG tablet Take 1 tablet (5 mg total) by mouth daily. 90 tablet 3   atorvastatin (LIPITOR) 80 MG tablet TAKE 1 TABLET EVERY DAY AT 6 PM 90 tablet 3   B Complex Vitamins (VITAMIN B COMPLEX PO) Take 1 tablet by mouth daily.     Budeson-Glycopyrrol-Formoterol (BREZTRI AEROSPHERE) 160-9-4.8 MCG/ACT AERO Take 2 puffs by mouth in the morning and at bedtime. Dispense 3 inhalers 3 g 4  Cholecalciferol (VITAMIN D3) 125 MCG (5000 UT) CAPS Take 5,000 Units by mouth daily.     fluticasone (FLONASE) 50 MCG/ACT nasal spray Place 2 sprays into both nostrils daily as needed for allergies. 48 g 2   hydrocortisone (ANUSOL-HC) 2.5 % rectal cream Place 1 application rectally 2 (two) times daily. (Patient taking differently: Place 1 application rectally 2 (two) times daily as needed for hemorrhoids.) 30 g 1   metoprolol tartrate (LOPRESSOR) 25 MG tablet Take 0.5 tablets (12.5 mg total) by mouth 2 (two) times daily. 90 tablet 3   nitroGLYCERIN (NITROSTAT) 0.4 MG SL tablet Place 1 tablet (0.4 mg total) under the tongue every 5 (five) minutes as needed for chest pain. 75 tablet 2   pantoprazole (PROTONIX) 40 MG tablet Take 1 tablet (40 mg total) by mouth daily. 30 tablet 3   polyethylene glycol-electrolytes (NULYTELY) 420 g solution As directed 4000 mL 0   warfarin (COUMADIN) 5 MG  tablet Take 1 tablet (5 mg total) by mouth daily. (Patient taking differently: Take 2.5-5 mg by mouth See admin instructions. 5 mg on Friday, 2.5 mg all other days) 90 tablet 3   No current facility-administered medications for this visit.     Past Surgical History:  Procedure Laterality Date   ANTERIOR CERVICAL DECOMPRESSION/DISCECTOMY FUSION 4 LEVELS N/A 07/21/2016   Procedure: ANTERIOR CERVICAL DECOMPRESSION/DISCECTOMY FUSION CERVICAL TWO-THREE, CERVICAL THREE-FOUR,. CERVICAL FOUR-FIVE, CERVICAL  FIVE-SIX;  Surgeon: Ashok Pall, MD;  Location: [facility];  Service: Neurosurgery;  Laterality: N/A;   BACK SURGERY     CARDIAC CATHETERIZATION  2011   CARDIOVERSION N/A 06/10/2020   Procedure: CARDIOVERSION;  Surgeon: Arnoldo Lenis, MD;  [facility];  Service: Endoscopy;  Laterality: N/A;   COLONOSCOPY     in remote past, Dr. Sharlett Iles. Obtaining records.    COLONOSCOPY N/A 11/21/2017   Surgeon: Daneil Dolin, MD; Three 4-6 mm polyps in the rectum and splenic flexure resected and retrieved (tubular adenomas), nonbleeding internal hemorrhoids.  Repeat in 5 years.   COLONOSCOPY WITH PROPOFOL N/A 06/09/2021   Procedure: COLONOSCOPY WITH PROPOFOL;  Surgeon: Daneil Dolin, MD;  [facility];  Service: Endoscopy;  Laterality: N/A;  2:00pm   CORONARY ANGIOPLASTY     CORONARY ARTERY BYPASS GRAFT     ESOPHAGOGASTRODUODENOSCOPY  05/27/2012   WVP:[id number] esophagus, stomach and duodenum s/p dilator   POLYPECTOMY  11/21/2017   Procedure: POLYPECTOMY;  Surgeon: Daneil Dolin, MD;  [facility];  Service: Endoscopy;;  colon   POLYPECTOMY  06/09/2021   Procedure: POLYPECTOMY;  Surgeon: Daneil Dolin, MD;  [facility];  Service: Endoscopy;;   PROSTATE SURGERY     TRANSURETHRAL RESECTION OF BLADDER TUMOR WITH MITOMYCIN-C Left 01/06/2020   Procedure: CYSTOSCOPY TRANSURETHRAL RESECTION OF BLADDER TUMOR,LEFT URETERSCOPY AND GEMCITABINE;  Surgeon: Irine Seal,  MD;  [facility];  Service: Urology;  Laterality: Left;     No Known Allergies    Family History  Problem Relation Age of Onset   Diabetes Mother    Hypertension Father    Heart disease Brother    Diabetes Brother    Heart disease Brother    Diabetes Brother    Multiple sclerosis Daughter    Healthy Son    Colon cancer Neg Hx    Inflammatory bowel disease Neg Hx      Social History Mr. Vassell reports that he has been smoking cigarettes and e-cigarettes. He started smoking about 51 years ago. He has  a 29.00 pack-year smoking history. He quit smokeless tobacco use about 9 years ago.  His smokeless tobacco use included snuff. Mr. Cromwell reports that he does not currently use alcohol.   Review of Systems CONSTITUTIONAL: No weight loss, fever, chills, weakness or fatigue.  HEENT: Eyes: No visual loss, blurred vision, double vision or yellow sclerae.No hearing loss, sneezing, congestion, runny nose or sore throat.  SKIN: No rash or itching.  CARDIOVASCULAR: per hpi RESPIRATORY: per hpi GASTROINTESTINAL: No anorexia, nausea, vomiting or diarrhea. No abdominal pain or blood.  GENITOURINARY: No burning on urination, no polyuria NEUROLOGICAL: No headache, dizziness, syncope, paralysis, ataxia, numbness or tingling in the extremities. No change in bowel or bladder control.  MUSCULOSKELETAL: No muscle, back pain, joint pain or stiffness.  LYMPHATICS: No enlarged nodes. No history of splenectomy.  PSYCHIATRIC: No history of depression or anxiety.  ENDOCRINOLOGIC: No reports of sweating, cold or heat intolerance. No polyuria or polydipsia.  Marland Kitchen   Physical Examination Today's Vitals   08/02/21 1414  BP: 122/70  Pulse: 73  SpO2: 97%  Weight: 144 lb 6.4 oz (65.5 kg)  Height: 5\' 6"  (1.676 m)   Body mass index is 23.31 kg/m.  Gen: resting comfortably, no acute distress HEENT: no scleral icterus, pupils equal round and reactive, no palptable cervical adenopathy,  CV: RRR, no  m/r/g no jvd Resp: mild bilateral wheezing GI: abdomen is soft, non-tender, non-distended, normal bowel sounds, no hepatosplenomegaly MSK: extremities are warm, no edema.  Skin: warm, no rash Neuro:  no focal deficits Psych: appropriate affect   Diagnostic Studies 11/25/2009:   Cardiac Cath Findings: Left ventricular angiogram was performed in the RAO projection and   showed normal left ventricular systolic function with ejection   fraction estimated at 50%. Mild mitral regurgitation was noted.   10.Aortic root angiogram was performed and did not show enlargement of   the aortic root.   IMPRESSION:   1. Severe triple-vessel coronary artery disease.   2. Patent bypass grafts 4/4.   3. Low normal left ventricular systolic function.     12/2012 Myoview Inferolateral scar, no active ischemia, LVEF 46%.     05/01/13 Echo: LVEF 55-60%, grade II diastolic dysfunction, multiple WMAs, mild MR,     06/2016 AAA [place] No aneurysm    Assessment and Plan  1.CAD   - has not been on ACE-I due to prior elevatiion of potassium - no symptoms, continue current meds   2. HTN   -at goal, continue current meds   3. Hyperlipidemia - he is at goal, coontinue current meds   4. Afib -eliquis too expensive, changed to coumadin - s/p DCCV 06/2020, has not had recurrence of afib since it appears - symptoms, continue current meds  5. COPD exacerbation - ongoing productive cough with SOB - will give 5 days of prednisone 40mg  and zpack  F/u 6 months    Arnoldo Lenis, M.D.

## 2021-08-16 ENCOUNTER — Ambulatory Visit (INDEPENDENT_AMBULATORY_CARE_PROVIDER_SITE_OTHER): Payer: Medicare HMO | Admitting: *Deleted

## 2021-08-16 DIAGNOSIS — I4891 Unspecified atrial fibrillation: Secondary | ICD-10-CM | POA: Diagnosis not present

## 2021-08-16 DIAGNOSIS — Z5181 Encounter for therapeutic drug level monitoring: Secondary | ICD-10-CM | POA: Diagnosis not present

## 2021-08-16 LAB — POCT INR: INR: 3.8 — AB (ref 2.0–3.0)

## 2021-08-16 NOTE — Patient Instructions (Signed)
Hold warfarin tonight, take 1/2 tablet tomorrow night then resume 1/2 a tablet daily except for 1 tablet on Mondays, Wednesdays and Fridays. Recheck INR in 3 weeks.

## 2021-09-06 ENCOUNTER — Ambulatory Visit (INDEPENDENT_AMBULATORY_CARE_PROVIDER_SITE_OTHER): Payer: Medicare HMO | Admitting: *Deleted

## 2021-09-06 DIAGNOSIS — Z5181 Encounter for therapeutic drug level monitoring: Secondary | ICD-10-CM | POA: Diagnosis not present

## 2021-09-06 DIAGNOSIS — I4891 Unspecified atrial fibrillation: Secondary | ICD-10-CM | POA: Diagnosis not present

## 2021-09-06 LAB — POCT INR: INR: 3.1 — AB (ref 2.0–3.0)

## 2021-09-06 NOTE — Patient Instructions (Signed)
Decrease warfarin to 1/2 a tablet daily except for 1 tablet on Mondays and Thursdays.  Recheck INR in 4 weeks.

## 2021-09-13 ENCOUNTER — Encounter (HOSPITAL_COMMUNITY): Payer: Self-pay

## 2021-09-13 NOTE — Progress Notes (Signed)
Attempted to reach patient regarding follow-up LDCT. Unable to reach patient, detailed VM left asking that the patient return my call.

## 2021-10-04 ENCOUNTER — Ambulatory Visit (INDEPENDENT_AMBULATORY_CARE_PROVIDER_SITE_OTHER): Payer: Medicare HMO | Admitting: *Deleted

## 2021-10-04 DIAGNOSIS — I4891 Unspecified atrial fibrillation: Secondary | ICD-10-CM

## 2021-10-04 DIAGNOSIS — Z5181 Encounter for therapeutic drug level monitoring: Secondary | ICD-10-CM | POA: Diagnosis not present

## 2021-10-04 LAB — POCT INR: INR: 3.4 — AB (ref 2.0–3.0)

## 2021-10-04 NOTE — Patient Instructions (Signed)
Hold warfarin tonight then decrease dose to 1/2  tablet daily except for 1 tablet on Mondays   Recheck INR in 3 weeks.

## 2021-10-23 NOTE — Progress Notes (Signed)
? ? ?Referring Provider: [name], MD ?Primary Care Physician:  [name], MD ?Primary GI Physician: Dr. Gala Romney ? ?Chief Complaint  ?Patient presents with  ? Follow-up  ? ? ?HPI:   ?[name] is a 76 y.o. male presenting today for follow-up. ? ?Last seen in our office 05/23/2021 for anemia, rectal bleeding, abnormal CT scan colon, lower abdominal pain, dysuria, and dysphagia.  He reported chronic history of toilet tissue hematochezia and rectal burning in the setting of hemorrhoids, but 2 weeks prior to office visit, he had worsening rectal bleeding with bright red blood in stool in toilet water with associated lower abdominal burning and lack of appetite.  Bleeding was occurring daily with every bowel movement but also had a few episodes where he passed gas and had rectal bleeding without a bowel movement.  By the time of his appointment, rectal bleeding had slacked off, essentially back to baseline with toilet tissue hematochezia.  Lower abdominal burning also almost completely resolved.  BMs at baseline.  Typical GERD symptoms well controlled on Protonix which was changed from omeprazole about 2 weeks prior.  Occasional solid food dysphagia that has been going on for a while, but preferred to hold off on endoscopy.  Also reported dysuria x1 week.  Reviewed labs from PCP completed 10/7 revealing hemoglobin 12.7 (L) with  macrocytic indices, down from 13.2, 6 months prior.   CT A/P 05/18/2021 with moderate concentric wall thickening involving the anus, suspicious for proctitis although anal carcinoma cannot definitively be excluded.  Plan included colonoscopy, CBC, iron panel, UA and urine culture, Anusol rectal cream twice daily, continue Protonix daily. ? ?Labs revealed hemoglobin 13.2, MCV 102.1, MCH 34.9, platelets 224.  Iron 214 (H), saturation 96% (H), ferritin 476 (H).  UA/urine culture negative.  Recommended patient's stop iron and follow-up with PCP for dysuria. ? ?Colonoscopy 06/09/2021:   Three 3-7 mm polyps in the ascending colon resected and retrieved, mild radiation proctitis, otherwise normal exam.  Notably, patient reported bleeding had resolved.  Pathology revealed tubular adenomas.  Recommended repeat colonoscopy in 5 years. ? ?Today: ? ?Rectal bleeding has resolved.  No melena. Bowels moving well.  No abdominal pain. ? ?GERD: Well-controlled on Protonix 40 mg daily. ? ?Dysphagia: Feels like liquids get stuck in his esophagus a couple times a week. No trouble with foods. Not ready for evaluation.  ? ?He was found to have low normal B12 and slight folate deficiency April 2022.  So started on B complex vitamin at that point but states he has not started on folate.  Has appt with PCP next month for follow-up.  ? ? ?Past Medical History:  ?Diagnosis Date  ? Anginal pain ([place])   ? Atrial fibrillation by electrocardiogram ([place]) 11/2019  ? Atrial flutter by electrocardiogram ([place]) 11/2019  ? Bladder cancer ([place]) 01/2020  ? Bladder tumor   ? BPH (benign prostatic hyperplasia)   ? CAD (coronary artery disease)   ? a. s/p INF MI 1997;  b. s/p CABG 2001;  c. [place] 11/2009:  3v CAD, S-PDA ok with 40% mid, S-OM ok, S-Dx ok, L-LAD ok, EF 50%;  d.  [place] 5/14:  Inferolateral scar, EF 46%, no ischemia  ? COPD (chronic obstructive pulmonary disease) ([place])   ? Dyspnea   ? ETOH abuse   ? GERD (gastroesophageal reflux disease)   ? History of gout   ? Hypercholesterolemia   ? Hypertension   ? Myocardial infarction [facility]) 2013  ? Prostate cancer ([place])   ?  Tobacco abuse   ? ? ?Past Surgical History:  ?Procedure Laterality Date  ? ANTERIOR CERVICAL DECOMPRESSION/DISCECTOMY FUSION 4 LEVELS N/A 07/21/2016  ? Procedure: ANTERIOR CERVICAL DECOMPRESSION/DISCECTOMY FUSION CERVICAL TWO-THREE, CERVICAL THREE-FOUR,. CERVICAL FOUR-FIVE, CERVICAL  FIVE-SIX;  Surgeon: Ashok Pall, MD;  Location: [facility];  Service: Neurosurgery;  Laterality: N/A;  ? BACK SURGERY    ? CARDIAC CATHETERIZATION  2011  ? CARDIOVERSION N/A 06/10/2020  ?  Procedure: CARDIOVERSION;  Surgeon: Arnoldo Lenis, MD;  [facility];  Service: Endoscopy;  Laterality: N/A;  ? COLONOSCOPY    ? in remote past, Dr. Sharlett Iles. Obtaining records.   ? COLONOSCOPY N/A 11/21/2017  ? Surgeon: Daneil Dolin, MD; Three 4-6 mm polyps in the rectum and splenic flexure resected and retrieved (tubular adenomas), nonbleeding internal hemorrhoids.  Repeat in 5 years.  ? COLONOSCOPY WITH PROPOFOL N/A 06/09/2021  ? Surgeon: Daneil Dolin, MD;  Three 3-7 mm polyps in the ascending colon resected and retrieved, mild radiation proctitis, otherwise normal exam. Pathology revealed tubular adenomas.  Recommended repeat colonoscopy in 5 years.  ? CORONARY ANGIOPLASTY    ? CORONARY ARTERY BYPASS GRAFT    ? ESOPHAGOGASTRODUODENOSCOPY  05/27/2012  ? [id number] esophagus, stomach and duodenum s/p dilator  ? POLYPECTOMY  11/21/2017  ? Procedure: POLYPECTOMY;  Surgeon: Daneil Dolin, MD;  [facility];  Service: Endoscopy;;  colon  ? POLYPECTOMY  06/09/2021  ? Procedure: POLYPECTOMY;  Surgeon: Daneil Dolin, MD;  [facility];  Service: Endoscopy;;  ? PROSTATE SURGERY    ? TRANSURETHRAL RESECTION OF BLADDER TUMOR WITH MITOMYCIN-C Left 01/06/2020  ? Procedure: CYSTOSCOPY TRANSURETHRAL RESECTION OF BLADDER TUMOR,LEFT URETERSCOPY AND GEMCITABINE;  Surgeon: Irine Seal, MD;  [facility];  Service: Urology;  Laterality: Left;  ? ? ?Current Outpatient Medications  ?Medication Sig Dispense Refill  ? acetaminophen (TYLENOL) 500 MG tablet Take 2 tablets (1,000 mg total) by mouth every 6 (six) hours as needed for moderate pain or headache. 90 tablet 0  ? albuterol (VENTOLIN HFA) 108 (90 Base) MCG/ACT inhaler Inhale 2 puffs into the lungs every 6 (six) hours as needed for wheezing or shortness of breath. 3 each 3  ? allopurinol (ZYLOPRIM) 100 MG tablet Take 1 tablet (100 mg total) by mouth daily. 90 tablet 3  ? amLODipine (NORVASC) 5 MG tablet Take 1 tablet (5 mg total)  by mouth daily. 90 tablet 3  ? atorvastatin (LIPITOR) 80 MG tablet TAKE 1 TABLET EVERY DAY AT 6 PM 90 tablet 3  ? B Complex Vitamins (VITAMIN B COMPLEX PO) Take 1 tablet by mouth daily.    ? Budeson-Glycopyrrol-Formoterol (BREZTRI AEROSPHERE) 160-9-4.8 MCG/ACT AERO Take 2 puffs by mouth in the morning and at bedtime. Dispense 3 inhalers 3 g 4  ? Cholecalciferol (VITAMIN D3) 125 MCG (5000 UT) CAPS Take 5,000 Units by mouth daily.    ? fluticasone (FLONASE) 50 MCG/ACT nasal spray Place 2 sprays into both nostrils daily as needed for allergies. 48 g 2  ? metoprolol tartrate (LOPRESSOR) 25 MG tablet Take 0.5 tablets (12.5 mg total) by mouth 2 (two) times daily. 90 tablet 3  ? nitroGLYCERIN (NITROSTAT) 0.4 MG SL tablet Place 1 tablet (0.4 mg total) under the tongue every 5 (five) minutes as needed for chest pain. 75 tablet 2  ? pantoprazole (PROTONIX) 40 MG tablet Take 1 tablet (40 mg total) by mouth daily. 30 tablet 3  ? predniSONE (DELTASONE) 20 MG tablet Take 2 tablets (40 mg total)  by mouth daily. 10 tablet 0  ? warfarin (COUMADIN) 5 MG tablet Take 1 tablet (5 mg total) by mouth daily. (Patient taking differently: Take 2.5-5 mg by mouth See admin instructions. 5 mg on Friday, 2.5 mg all other days) 90 tablet 3  ? hydrocortisone (ANUSOL-HC) 2.5 % rectal cream Place 1 application rectally 2 (two) times daily. (Patient not taking: Reported on 10/24/2021) 30 g 1  ? ?No current facility-administered medications for this visit.  ? ? ?Allergies as of 10/24/2021  ? (No Known Allergies)  ? ? ?Family History  ?Problem Relation Age of Onset  ? Diabetes Mother   ? Hypertension Father   ? Heart disease Brother   ? Diabetes Brother   ? Heart disease Brother   ? Diabetes Brother   ? Multiple sclerosis Daughter   ? Healthy Son   ? Colon cancer Neg Hx   ? Inflammatory bowel disease Neg Hx   ? ? ?Social History  ? ?Socioeconomic History  ? Marital status: Married  ?  Spouse name: Not on file  ? Number of children: 2  ? Years of  education: Not on file  ? Highest education level: Not on file  ?Occupational History  ?  Comment: retired  ?Tobacco Use  ? Smoking status: Every Day  ?  Packs/day: 0.50  ?  Years: 58.00  ?  Pack years: 29.00

## 2021-10-24 ENCOUNTER — Encounter: Payer: Self-pay | Admitting: Gastroenterology

## 2021-10-24 ENCOUNTER — Ambulatory Visit (INDEPENDENT_AMBULATORY_CARE_PROVIDER_SITE_OTHER): Payer: Medicare HMO | Admitting: Gastroenterology

## 2021-10-24 ENCOUNTER — Other Ambulatory Visit: Payer: Self-pay

## 2021-10-24 VITALS — BP 110/74 | HR 98 | Temp 97.7°F | Ht 66.0 in | Wt 149.2 lb

## 2021-10-24 DIAGNOSIS — R7989 Other specified abnormal findings of blood chemistry: Secondary | ICD-10-CM

## 2021-10-24 DIAGNOSIS — K625 Hemorrhage of anus and rectum: Secondary | ICD-10-CM | POA: Diagnosis not present

## 2021-10-24 DIAGNOSIS — D649 Anemia, unspecified: Secondary | ICD-10-CM

## 2021-10-24 DIAGNOSIS — R1310 Dysphagia, unspecified: Secondary | ICD-10-CM | POA: Diagnosis not present

## 2021-10-24 NOTE — Patient Instructions (Signed)
Please have blood work completed at [facility] ? ?Please follow-up with your primary care doctor on anemia which is likely secondary to mildly low folate and low normal B12. ? ?Continue Protonix 40 mg daily. ? ?Monitor for worsening swallowing problems and let me know if you would like to have this evaluated further. ? ?Monitor for recurrent rectal bleeding and let me know if this occurs. ? ?We will plan to follow-up with you in 6 months.  Do not hesitate to call if you have questions or concerns prior to your next visit. ? ?It was good to see you again today! ? ?Aliene Altes, PA-C ?[facility] ? ?

## 2021-10-25 ENCOUNTER — Ambulatory Visit (INDEPENDENT_AMBULATORY_CARE_PROVIDER_SITE_OTHER): Payer: Medicare HMO | Admitting: *Deleted

## 2021-10-25 DIAGNOSIS — Z5181 Encounter for therapeutic drug level monitoring: Secondary | ICD-10-CM | POA: Diagnosis not present

## 2021-10-25 DIAGNOSIS — I4891 Unspecified atrial fibrillation: Secondary | ICD-10-CM | POA: Diagnosis not present

## 2021-10-25 LAB — CBC WITH DIFFERENTIAL/PLATELET
Absolute Monocytes: 454 cells/uL (ref 200–950)
Basophils Absolute: 29 cells/uL (ref 0–200)
Basophils Relative: 0.4 %
Eosinophils Absolute: 209 cells/uL (ref 15–500)
Eosinophils Relative: 2.9 %
HCT: 38.1 % — ABNORMAL LOW (ref 38.5–50.0)
Hemoglobin: 13.1 g/dL — ABNORMAL LOW (ref 13.2–17.1)
Lymphs Abs: 2470 cells/uL (ref 850–3900)
MCH: 34.5 pg — ABNORMAL HIGH (ref 27.0–33.0)
MCHC: 34.4 g/dL (ref 32.0–36.0)
MCV: 100.3 fL — ABNORMAL HIGH (ref 80.0–100.0)
MPV: 9.9 fL (ref 7.5–12.5)
Monocytes Relative: 6.3 %
Neutro Abs: 4039 cells/uL (ref 1500–7800)
Neutrophils Relative %: 56.1 %
Platelets: 216 10*3/uL (ref 140–400)
RBC: 3.8 10*6/uL — ABNORMAL LOW (ref 4.20–5.80)
RDW: 14.5 % (ref 11.0–15.0)
Total Lymphocyte: 34.3 %
WBC: 7.2 10*3/uL (ref 3.8–10.8)

## 2021-10-25 LAB — IRON, TOTAL/TOTAL IRON BINDING CAP
%SAT: 58 % (calc) — ABNORMAL HIGH (ref 20–48)
Iron: 137 ug/dL (ref 50–180)
TIBC: 235 mcg/dL (calc) — ABNORMAL LOW (ref 250–425)

## 2021-10-25 LAB — POCT INR: INR: 2.8 (ref 2.0–3.0)

## 2021-10-25 NOTE — Patient Instructions (Signed)
Continue warfarin 1/2  tablet daily except for 1 tablet on Mondays  ? Recheck INR in 4 weeks. ?

## 2021-11-28 ENCOUNTER — Encounter: Payer: Self-pay | Admitting: Family Medicine

## 2021-11-28 ENCOUNTER — Ambulatory Visit (INDEPENDENT_AMBULATORY_CARE_PROVIDER_SITE_OTHER): Payer: Medicare HMO | Admitting: Family Medicine

## 2021-11-28 ENCOUNTER — Ambulatory Visit (INDEPENDENT_AMBULATORY_CARE_PROVIDER_SITE_OTHER): Payer: Medicare HMO

## 2021-11-28 VITALS — Wt 149.0 lb

## 2021-11-28 VITALS — BP 149/81 | HR 91 | Temp 97.5°F | Ht 66.0 in | Wt 150.0 lb

## 2021-11-28 DIAGNOSIS — R600 Localized edema: Secondary | ICD-10-CM

## 2021-11-28 DIAGNOSIS — E785 Hyperlipidemia, unspecified: Secondary | ICD-10-CM

## 2021-11-28 DIAGNOSIS — R7989 Other specified abnormal findings of blood chemistry: Secondary | ICD-10-CM

## 2021-11-28 DIAGNOSIS — Z23 Encounter for immunization: Secondary | ICD-10-CM

## 2021-11-28 DIAGNOSIS — J449 Chronic obstructive pulmonary disease, unspecified: Secondary | ICD-10-CM

## 2021-11-28 DIAGNOSIS — I4891 Unspecified atrial fibrillation: Secondary | ICD-10-CM | POA: Diagnosis not present

## 2021-11-28 DIAGNOSIS — Z0000 Encounter for general adult medical examination without abnormal findings: Secondary | ICD-10-CM | POA: Diagnosis not present

## 2021-11-28 DIAGNOSIS — I10 Essential (primary) hypertension: Secondary | ICD-10-CM | POA: Diagnosis not present

## 2021-11-28 MED ORDER — BREZTRI AEROSPHERE 160-9-4.8 MCG/ACT IN AERO: 2.0000 | INHALATION_SPRAY | Freq: Two times a day (BID) | RESPIRATORY_TRACT | 3 refills | Status: DC

## 2021-11-28 MED ORDER — ALBUTEROL SULFATE HFA 108 (90 BASE) MCG/ACT IN AERS: 2.0000 | INHALATION_SPRAY | Freq: Four times a day (QID) | RESPIRATORY_TRACT | 3 refills | Status: DC | PRN

## 2021-11-28 NOTE — Progress Notes (Signed)
? ?Subjective:  ?[name], male    DOB: June 17, 1946  Age: 76 y.o. MRN: [id number] ? ?CC: Medical Management of Chronic Issues ? ? ?HPI ?[name] presents for  presents for DOE. Using albuterol. Gets short of bbreath on AN INCLINE not on flat ground.  ? ?Patient in for follow-up of GERD. Currently asymptomatic taking  PPI daily. There is no chest pain or heartburn. No hematemesis and no melena. No dysphagia or choking. Onset is remote. Progression is stable. Complicating factors, none. ? ? ?  follow-up of hypertension. Patient has no history of headache chest pain or shortness of breath or recent cough. Patient also denies symptoms of TIA such as focal numbness or weakness. Patient denies side effects from medication. States taking it regularly. ? ? in for follow-up of elevated cholesterol. Doing well without complaints on current medication. Denies side effects of statin including myalgia and arthralgia and nausea. Currently no chest pain, shortness of breath or other cardiovascular related symptoms noted. ? ? ? ?  11/28/2021  ?  1:14 PM 11/28/2021  ? 12:01 PM 05/30/2021  ?  1:08 PM  ?Depression screen PHQ 2/9  ?Decreased Interest 0 0 0  ?Down, Depressed, Hopeless 0 0 0  ?PHQ - 2 Score 0 0 0  ?Altered sleeping 0    ?Tired, decreased energy 0    ?Change in appetite 2    ?Feeling bad or failure about yourself  0    ?Trouble concentrating 0    ?Moving slowly or fidgety/restless 0    ?Suicidal thoughts 0    ?PHQ-9 Score 2    ?Difficult doing work/chores Not difficult at all    ? ? ?History ?[name] has a past medical history of Anginal pain (HCC), Atrial fibrillation by electrocardiogram (HCC) (11/2019), Atrial flutter by electrocardiogram (HCC) (11/2019), Bladder cancer (HCC) (01/2020), Bladder tumor, BPH (benign prostatic hyperplasia), CAD (coronary artery disease), COPD (chronic obstructive pulmonary disease) (HCC), Dyspnea, ETOH abuse, GERD (gastroesophageal reflux disease), History of gout,  Hypercholesterolemia, Hypertension, Myocardial infarction (HCC) (2013), Prostate cancer (HCC), and Tobacco abuse.  ? ?[name] has a past surgical history that includes Prostate surgery; Colonoscopy; Back surgery; Esophagogastroduodenoscopy (05/27/2012); Cardiac catheterization (2011); Coronary angioplasty; Coronary artery bypass graft; Anterior cervical decompression/discectomy fusion 4 level (N/A, 07/21/2016); Colonoscopy (N/A, 11/21/2017); polypectomy (11/21/2017); Transurethral resection of bladder tumor with mitomycin-c (Left, 01/06/2020); Cardioversion (N/A, 06/10/2020); Colonoscopy with propofol (N/A, 06/09/2021); and polypectomy (06/09/2021).  ? ?His family history includes Diabetes in his brother, brother, and mother; Healthy in his son; Heart disease in his brother and brother; Hypertension in his father; Multiple sclerosis in his daughter.[name] reports that [name] has been smoking cigarettes and e-cigarettes. [name] started smoking about 51 years ago. [name] has a 29.00 pack-year smoking history. [name] quit smokeless tobacco use about 9 years ago.  His smokeless tobacco use included snuff. [name] reports current alcohol use of about 2.0 - 4.0 standard drinks per week. [name] reports that [name] does not use drugs. ? ? ? ?ROS ?Review of Systems  ?Constitutional:  Negative for fever.  ?Respiratory:  Positive for cough (q AM ball of phlegm comes up.) and shortness of breath.   ?Cardiovascular:  Negative for chest pain.  ?Musculoskeletal:  Negative for arthralgias.  ?Skin:  Negative for rash.  ? ?Objective:  ?BP (!) 149/81   Pulse 91   Temp (!) 97.5 ?F (36.4 ?C)   Ht 5\' 6"  (1.676 m)   Wt 150 lb (68 kg)   SpO2 98%  BMI 24.21 kg/m?  ? ?BP Readings from Last 3 Encounters:  ?11/28/21 (!) 149/81  ?10/24/21 110/74  ?08/02/21 122/70  ? ? ?Wt Readings from Last 3 Encounters:  ?11/28/21 150 lb (68 kg)  ?11/28/21 149 lb (67.6 kg)  ?10/24/21 149 lb 3.2 oz (67.7 kg)  ? ? ? ?Physical Exam ?Vitals reviewed.  ?Constitutional:   ?   Appearance: [name] is  well-developed.  ?HENT:  ?   Head: Normocephalic and atraumatic.  ?   Right Ear: External ear normal.  ?   Left Ear: External ear normal.  ?   Mouth/Throat:  ?   Pharynx: No oropharyngeal exudate or posterior oropharyngeal erythema.  ?Eyes:  ?   Pupils: Pupils are equal, round, and reactive to light.  ?Cardiovascular:  ?   Rate and Rhythm: Normal rate and regular rhythm.  ?   Heart sounds: No murmur heard. ?Pulmonary:  ?   Effort: No respiratory distress.  ?   Breath sounds: Normal breath sounds.  ?Musculoskeletal:  ?   Cervical back: Normal range of motion and neck supple.  ?Neurological:  ?   Mental Status: [name] is alert and oriented to person, place, and time.  ? ? ? ? ?Assessment & Plan:  ? ?[name] was seen today for medical management of chronic issues. ? ?Diagnoses and all orders for this visit: ? ?Essential hypertension, benign ?-     CBC with Differential/Platelet ?-     CMP14+EGFR ?-     Brain natriuretic peptide ? ?Hyperlipidemia, unspecified hyperlipidemia type ?-     Lipid panel ? ?Chronic obstructive pulmonary disease, unspecified COPD type (HCC) ?-     albuterol (VENTOLIN HFA) 108 (90 Base) MCG/ACT inhaler; Inhale 2 puffs into the lungs every 6 (six) hours as needed for wheezing or shortness of breath. ? ?Elevated ferritin ?-     Ferritin ? ?Atrial fibrillation, unspecified type ([place]) ?-     Brain natriuretic peptide ? ?Edema of both lower legs ?-     Brain natriuretic peptide ? ?Other orders ?-     Budeson-Glycopyrrol-Formoterol (BREZTRI AEROSPHERE) 160-9-4.8 MCG/ACT AERO; Take 2 puffs by mouth in the morning and at bedtime. Dispense 3 inhalers ? ? ? ? ? ? ?I am having [name] maintain his Vitamin D3, acetaminophen, nitroGLYCERIN, fluticasone, pantoprazole, B Complex Vitamins (VITAMIN B COMPLEX PO), allopurinol, amLODipine, atorvastatin, warfarin, metoprolol tartrate, albuterol, and Breztri Aerosphere. ? ?Allergies as of 11/28/2021   ?No Known Allergies ?  ? ?  ?Medication List  ?  ? ?  ? Accurate  as of November 28, 2021  1:42 PM. If you have any questions, ask your nurse or doctor.  ?  ?  ? ?  ? ?STOP taking these medications   ? ?hydrocortisone 2.5 % rectal cream ?Commonly known as: ANUSOL-HC ?Stopped by: Sandrea Hammond, LPN ?  ?predniSONE 20 MG tablet ?Commonly known as: DELTASONE ?Stopped by: Sandrea Hammond, LPN ?  ? ?  ? ?TAKE these medications   ? ?acetaminophen 500 MG tablet ?Commonly known as: TYLENOL ?Take 2 tablets (1,000 mg total) by mouth every 6 (six) hours as needed for moderate pain or headache. ?  ?albuterol 108 (90 Base) MCG/ACT inhaler ?Commonly known as: Ventolin HFA ?Inhale 2 puffs into the lungs every 6 (six) hours as needed for wheezing or shortness of breath. ?  ?allopurinol 100 MG tablet ?Commonly known as: ZYLOPRIM ?Take 1 tablet (100 mg total) by mouth daily. ?  ?amLODipine 5 MG tablet ?Commonly known  as: [place] ?Take 1 tablet (5 mg total) by mouth daily. ?  ?atorvastatin 80 MG tablet ?Commonly known as: LIPITOR ?TAKE 1 TABLET EVERY DAY AT 6 PM ?  ?Breztri Aerosphere 160-9-4.8 MCG/ACT Aero ?Generic drug: Budeson-Glycopyrrol-Formoterol ?Take 2 puffs by mouth in the morning and at bedtime. Dispense 3 inhalers ?  ?fluticasone 50 MCG/ACT nasal spray ?Commonly known as: FLONASE ?Place 2 sprays into both nostrils daily as needed for allergies. ?  ?metoprolol tartrate 25 MG tablet ?Commonly known as: LOPRESSOR ?Take 0.5 tablets (12.5 mg total) by mouth 2 (two) times daily. ?  ?nitroGLYCERIN 0.4 MG SL tablet ?Commonly known as: NITROSTAT ?Place 1 tablet (0.4 mg total) under the tongue every 5 (five) minutes as needed for chest pain. ?  ?pantoprazole 40 MG tablet ?Commonly known as: PROTONIX ?Take 1 tablet (40 mg total) by mouth daily. ?  ?VITAMIN B COMPLEX PO ?Take 1 tablet by mouth daily. ?  ?Vitamin D3 125 MCG (5000 UT) Caps ?Take 5,000 Units by mouth daily. ?  ?warfarin 5 MG tablet ?Commonly known as: COUMADIN ?Take as directed by the anticoagulation clinic. If you are unsure how to take this  medication, talk to your nurse or doctor. ?Original instructions: Take 1 tablet (5 mg total) by mouth daily. ?What changed:  ?how much to take ?when to take this ?additional instructions ?  ? ?  ? ? ? ?Follow-up: Re

## 2021-11-28 NOTE — Addendum Note (Signed)
Addended by: Christia Reading on: 11/28/2021 04:37 PM ? ? Modules accepted: Orders ? ?

## 2021-11-28 NOTE — Progress Notes (Signed)
? ?Subjective:  ? [name] is a 76 y.o. male who presents for Medicare Annual/Subsequent preventive examination. ? ?Virtual Visit via Telephone Note ? ?I connected with  [name] on 11/28/21 at 12:00 PM EDT by telephone and verified that I am speaking with the correct person using two identifiers. ? ?Location: ?Patient: Home ?Provider: [facility] ?Persons participating in the virtual visit: patient/Nurse Health Advisor ?  ?I discussed the limitations, risks, security and privacy concerns of performing an evaluation and management service by telephone and the availability of in person appointments. The patient expressed understanding and agreed to proceed. ? ?Interactive audio and video telecommunications were attempted between this nurse and patient, however failed, due to patient having technical difficulties OR patient did not have access to video capability.  We continued and completed visit with audio only. ? ?Some vital signs may be absent or patient reported.  ? ?Ralph Canche Dionne Ano, LPN  ? ?Review of Systems    ? ?Cardiac Risk Factors include: advanced age (>60mn, >>7women);male gender;smoking/ tobacco exposure;sedentary lifestyle;hypertension;Other (see comment), Risk factor comments: CAD, A.FIB, alcohol abuse ? ?   ?Objective:  ?  ?Today's Vitals  ? 11/28/21 1157  ?Weight: 149 lb (67.6 kg)  ? ?Body mass index is 24.05 kg/m?. ? ? ?  11/28/2021  ? 12:05 PM 06/09/2021  ?  1:47 PM 06/07/2021  ?  1:24 PM 11/26/2020  ? 12:51 PM 06/10/2020  ?  8:22 AM 06/08/2020  ?  1:05 PM 01/06/2020  ?  7:30 AM  ?Advanced Directives  ?Does Patient Have a Medical Advance Directive? No No No No No No No  ?Would patient like information on creating a medical advance directive? No - Patient declined No - Patient declined No - Patient declined Yes (MAU/Ambulatory/Procedural Areas - Information given) No - Patient declined No - Patient declined No - Patient declined  ? ? ?Current Medications (verified) ?Outpatient Encounter Medications as of  11/28/2021  ?Medication Sig  ? acetaminophen (TYLENOL) 500 MG tablet Take 2 tablets (1,000 mg total) by mouth every 6 (six) hours as needed for moderate pain or headache.  ? albuterol (VENTOLIN HFA) 108 (90 Base) MCG/ACT inhaler Inhale 2 puffs into the lungs every 6 (six) hours as needed for wheezing or shortness of breath.  ? allopurinol (ZYLOPRIM) 100 MG tablet Take 1 tablet (100 mg total) by mouth daily.  ? amLODipine (NORVASC) 5 MG tablet Take 1 tablet (5 mg total) by mouth daily.  ? atorvastatin (LIPITOR) 80 MG tablet TAKE 1 TABLET EVERY DAY AT 6 PM  ? B Complex Vitamins (VITAMIN B COMPLEX PO) Take 1 tablet by mouth daily.  ? Budeson-Glycopyrrol-Formoterol (BREZTRI AEROSPHERE) 160-9-4.8 MCG/ACT AERO Take 2 puffs by mouth in the morning and at bedtime. Dispense 3 inhalers  ? Cholecalciferol (VITAMIN D3) 125 MCG (5000 UT) CAPS Take 5,000 Units by mouth daily.  ? fluticasone (FLONASE) 50 MCG/ACT nasal spray Place 2 sprays into both nostrils daily as needed for allergies.  ? metoprolol tartrate (LOPRESSOR) 25 MG tablet Take 0.5 tablets (12.5 mg total) by mouth 2 (two) times daily.  ? pantoprazole (PROTONIX) 40 MG tablet Take 1 tablet (40 mg total) by mouth daily.  ? warfarin (COUMADIN) 5 MG tablet Take 1 tablet (5 mg total) by mouth daily. (Patient taking differently: Take 2.5-5 mg by mouth See admin instructions. 5 mg on Friday, 2.5 mg all other days)  ? nitroGLYCERIN (NITROSTAT) 0.4 MG SL tablet Place 1 tablet (0.4 mg total) under the tongue every  5 (five) minutes as needed for chest pain. (Patient not taking: Reported on 11/28/2021)  ? [DISCONTINUED] hydrocortisone (ANUSOL-HC) 2.5 % rectal cream Place 1 application rectally 2 (two) times daily. (Patient not taking: Reported on 10/24/2021)  ? [DISCONTINUED] predniSONE (DELTASONE) 20 MG tablet Take 2 tablets (40 mg total) by mouth daily.  ? ?No facility-administered encounter medications on file as of 11/28/2021.  ? ? ?Allergies (verified) ?Patient has no known  allergies.  ? ?History: ?Past Medical History:  ?Diagnosis Date  ? Anginal pain ([place])   ? Atrial fibrillation by electrocardiogram ([place]) 11/2019  ? Atrial flutter by electrocardiogram ([place]) 11/2019  ? Bladder cancer ([place]) 01/2020  ? Bladder tumor   ? BPH (benign prostatic hyperplasia)   ? CAD (coronary artery disease)   ? a. s/p INF MI 1997;  b. s/p CABG 2001;  c. Index 11/2009:  3v CAD, S-PDA ok with 40% mid, S-OM ok, S-Dx ok, L-LAD ok, EF 50%;  d.  Lex MV 5/14:  Inferolateral scar, EF 46%, no ischemia  ? COPD (chronic obstructive pulmonary disease) ([place])   ? Dyspnea   ? ETOH abuse   ? GERD (gastroesophageal reflux disease)   ? History of gout   ? Hypercholesterolemia   ? Hypertension   ? Myocardial infarction [facility]) 2013  ? Prostate cancer ([place])   ? Tobacco abuse   ? ?Past Surgical History:  ?Procedure Laterality Date  ? ANTERIOR CERVICAL DECOMPRESSION/DISCECTOMY FUSION 4 LEVELS N/A 07/21/2016  ? Procedure: ANTERIOR CERVICAL DECOMPRESSION/DISCECTOMY FUSION CERVICAL TWO-THREE, CERVICAL THREE-FOUR,. CERVICAL FOUR-FIVE, CERVICAL  FIVE-SIX;  Surgeon: Ashok Pall, MD;  Location: [facility];  Service: Neurosurgery;  Laterality: N/A;  ? BACK SURGERY    ? CARDIAC CATHETERIZATION  2011  ? CARDIOVERSION N/A 06/10/2020  ? Procedure: CARDIOVERSION;  Surgeon: Arnoldo Lenis, MD;  [facility];  Service: Endoscopy;  Laterality: N/A;  ? COLONOSCOPY    ? in remote past, Dr. Sharlett Iles. Obtaining records.   ? COLONOSCOPY N/A 11/21/2017  ? Surgeon: Daneil Dolin, MD; Three 4-6 mm polyps in the rectum and splenic flexure resected and retrieved (tubular adenomas), nonbleeding internal hemorrhoids.  Repeat in 5 years.  ? COLONOSCOPY WITH PROPOFOL N/A 06/09/2021  ? Surgeon: Daneil Dolin, MD;  Three 3-7 mm polyps in the ascending colon resected and retrieved, mild radiation proctitis, otherwise normal exam. Pathology revealed tubular adenomas.  Recommended repeat colonoscopy in 5 years.  ? CORONARY ANGIOPLASTY    ? CORONARY  ARTERY BYPASS GRAFT    ? ESOPHAGOGASTRODUODENOSCOPY  05/27/2012  ? SHF:[id number] esophagus, stomach and duodenum s/p dilator  ? POLYPECTOMY  11/21/2017  ? Procedure: POLYPECTOMY;  Surgeon: Daneil Dolin, MD;  [facility];  Service: Endoscopy;;  colon  ? POLYPECTOMY  06/09/2021  ? Procedure: POLYPECTOMY;  Surgeon: Daneil Dolin, MD;  [facility];  Service: Endoscopy;;  ? PROSTATE SURGERY    ? TRANSURETHRAL RESECTION OF BLADDER TUMOR WITH MITOMYCIN-C Left 01/06/2020  ? Procedure: CYSTOSCOPY TRANSURETHRAL RESECTION OF BLADDER TUMOR,LEFT URETERSCOPY AND GEMCITABINE;  Surgeon: Irine Seal, MD;  Location: [facility];  Service: Urology;  Laterality: Left;  ? ?Family History  ?Problem Relation Age of Onset  ? Diabetes Mother   ? Hypertension Father   ? Heart disease Brother   ? Diabetes Brother   ? Heart disease Brother   ? Diabetes Brother   ? Multiple sclerosis Daughter   ? Healthy Son   ? Colon cancer Neg Hx   ? Inflammatory bowel disease  Neg Hx   ? ?Social History  ? ?Socioeconomic History  ? Marital status: Married  ?  Spouse name: Not on file  ? Number of children: 2  ? Years of education: Not on file  ? Highest education level: Not on file  ?Occupational History  ?  Comment: retired  ?Tobacco Use  ? Smoking status: Every Day  ?  Packs/day: 0.50  ?  Years: 58.00  ?  Pack years: 29.00  ?  Types: Cigarettes, E-cigarettes  ?  Start date: 03/08/1970  ? Smokeless tobacco: Former  ?  Types: Snuff  ?  Quit date: 03/08/2012  ? Tobacco comments:  ?  1/2 ppd  ?Vaping Use  ? Vaping Use: Every day  ?Substance and Sexual Activity  ? Alcohol use: Yes  ?  Alcohol/week: 2.0 - 4.0 standard drinks  ?  Types: 2 - 4 Cans of beer per week  ?  Comment: 2-4 beers per day  ? Drug use: No  ? Sexual activity: Yes  ?  Partners: Female  ?Other Topics Concern  ? Not on file  ?Social History Narrative  ? His brother lives with him and his wife. Daughter lives in [place], son lives nearby.  ? ?Social Determinants of Health  ? ?Financial  Resource Strain: Low Risk   ? Difficulty of Paying Living Expenses: Not hard at all  ?Food Insecurity: No Food Insecurity  ? Worried About Charity fundraiser in the Last Year: Never true  ? Ran Out of [employer]

## 2021-11-28 NOTE — Patient Instructions (Signed)
[name] , ?Thank you for taking time to come for your Medicare Wellness Visit. I appreciate your ongoing commitment to your health goals. Please review the following plan we discussed and let me know if I can assist you in the future.  ? ?Screening recommendations/referrals: ?Colonoscopy: Done 06/09/2021 - Repeat in 5 years  ?Recommended yearly ophthalmology/optometry visit for glaucoma screening and checkup ?Recommended yearly dental visit for hygiene and checkup ? ?Vaccinations: ?Influenza vaccine: Done 05/30/2021 - Repeat annually ?Pneumococcal vaccine: Done  11/27/2012 & 01/19/2017 ?Tdap vaccine: Done 05/26/2020 - Repeat in 10 years ?Shingles vaccine: Due - Shingrix is 2 doses 2-6 months apart and over 90% effective     ?Covid-19: Done 11/06/2019, 12/04/2019, & 07/20/2020 ? ?Advanced directives: Advance directive discussed with you today. Even though you declined this today, please call our office should you change your mind, and we can give you the proper paperwork for you to fill out.  ? ?Conditions/risks identified: Aim for 30 minutes of exercise or brisk walking, 6-8 glasses of water, and 5 servings of fruits and vegetables each day.  ? ?Next appointment: Follow up in one year for your annual wellness visit.  ? ?Preventive Care 17 Years and Older, Male ? ?Preventive care refers to lifestyle choices and visits with your health care provider that can promote health and wellness. ?What does preventive care include? ?A yearly physical exam. This is also called an annual well check. ?Dental exams once or twice a year. ?Routine eye exams. Ask your health care provider how often you should have your eyes checked. ?Personal lifestyle choices, including: ?Daily care of your teeth and gums. ?Regular physical activity. ?Eating a healthy diet. ?Avoiding tobacco and drug use. ?Limiting alcohol use. ?Practicing safe sex. ?Taking low doses of aspirin every day. ?Taking vitamin and mineral supplements as recommended by your  health care provider. ?What happens during an annual well check? ?The services and screenings done by your health care provider during your annual well check will depend on your age, overall health, lifestyle risk factors, and family history of disease. ?Counseling  ?Your health care provider may ask you questions about your: ?Alcohol use. ?Tobacco use. ?Drug use. ?Emotional well-being. ?Home and relationship well-being. ?Sexual activity. ?Eating habits. ?History of falls. ?Memory and ability to understand (cognition). ?Work and work Statistician. ?Screening  ?You may have the following tests or measurements: ?Height, weight, and BMI. ?Blood pressure. ?Lipid and cholesterol levels. These may be checked every 5 years, or more frequently if you are over 30 years old. ?Skin check. ?Lung cancer screening. You may have this screening every year starting at age 64 if you have a 30-pack-year history of smoking and currently smoke or have quit within the past 15 years. ?Fecal occult blood test (FOBT) of the stool. You may have this test every year starting at age 41. ?Flexible sigmoidoscopy or colonoscopy. You may have a sigmoidoscopy every 5 years or a colonoscopy every 10 years starting at age 29. ?Prostate cancer screening. Recommendations will vary depending on your family history and other risks. ?Hepatitis C blood test. ?Hepatitis B blood test. ?Sexually transmitted disease (STD) testing. ?Diabetes screening. This is done by checking your blood sugar (glucose) after you have not eaten for a while (fasting). You may have this done every 1-3 years. ?Abdominal aortic aneurysm (AAA) screening. You may need this if you are a current or former smoker. ?Osteoporosis. You may be screened starting at age 34 if you are at high risk. ?Talk with your health  care provider about your test results, treatment options, and if necessary, the need for more tests. ?Vaccines  ?Your health care provider may recommend certain vaccines, such  as: ?Influenza vaccine. This is recommended every year. ?Tetanus, diphtheria, and acellular pertussis (Tdap, Td) vaccine. You may need a Td booster every 10 years. ?Zoster vaccine. You may need this after age 43. ?Pneumococcal 13-valent conjugate (PCV13) vaccine. One dose is recommended after age 75. ?Pneumococcal polysaccharide (PPSV23) vaccine. One dose is recommended after age 38. ?Talk to your health care provider about which screenings and vaccines you need and how often you need them. ?This information is not intended to replace advice given to you by your health care provider. Make sure you discuss any questions you have with your health care provider. ?Document Released: 08/20/2015 Document Revised: 04/12/2016 Document Reviewed: 05/25/2015 ?Elsevier Interactive Patient Education ? 2017 [place]. ? ?Fall Prevention in the Home ?Falls can cause injuries. They can happen to people of all ages. There are many things you can do to make your home safe and to help prevent falls. ?What can I do on the outside of my home? ?Regularly fix the edges of walkways and driveways and fix any cracks. ?Remove anything that might make you trip as you walk through a door, such as a raised step or threshold. ?Trim any bushes or trees on the path to your home. ?Use bright outdoor lighting. ?Clear any walking paths of anything that might make someone trip, such as rocks or tools. ?Regularly check to see if handrails are loose or broken. Make sure that both sides of any steps have handrails. ?Any raised decks and porches should have guardrails on the edges. ?Have any leaves, snow, or ice cleared regularly. ?Use sand or salt on walking paths during winter. ?Clean up any spills in your garage right away. This includes oil or grease spills. ?What can I do in the bathroom? ?Use night lights. ?Install grab bars by the toilet and in the tub and shower. Do not use towel bars as grab bars. ?Use non-skid mats or decals in the tub or  shower. ?If you need to sit down in the shower, use a plastic, non-slip stool. ?Keep the floor dry. Clean up any water that spills on the floor as soon as it happens. ?Remove soap buildup in the tub or shower regularly. ?Attach bath mats securely with double-sided non-slip rug tape. ?Do not have throw rugs and other things on the floor that can make you trip. ?What can I do in the bedroom? ?Use night lights. ?Make sure that you have a light by your bed that is easy to reach. ?Do not use any sheets or blankets that are too big for your bed. They should not hang down onto the floor. ?Have a firm chair that has side arms. You can use this for support while you get dressed. ?Do not have throw rugs and other things on the floor that can make you trip. ?What can I do in the kitchen? ?Clean up any spills right away. ?Avoid walking on wet floors. ?Keep items that you use a lot in easy-to-reach places. ?If you need to reach something above you, use a strong step stool that has a grab bar. ?Keep electrical cords out of the way. ?Do not use floor polish or wax that makes floors slippery. If you must use wax, use non-skid floor wax. ?Do not have throw rugs and other things on the floor that can make you trip. ?What can  I do with my stairs? ?Do not leave any items on the stairs. ?Make sure that there are handrails on both sides of the stairs and use them. Fix handrails that are broken or loose. Make sure that handrails are as long as the stairways. ?Check any carpeting to make sure that it is firmly attached to the stairs. Fix any carpet that is loose or worn. ?Avoid having throw rugs at the top or bottom of the stairs. If you do have throw rugs, attach them to the floor with carpet tape. ?Make sure that you have a light switch at the top of the stairs and the bottom of the stairs. If you do not have them, ask someone to add them for you. ?What else can I do to help prevent falls? ?Wear shoes that: ?Do not have high heels. ?Have  rubber bottoms. ?Are comfortable and fit you well. ?Are closed at the toe. Do not wear sandals. ?If you use a stepladder: ?Make sure that it is fully opened. Do not climb a closed stepladder. ?Make sure tha

## 2021-11-29 ENCOUNTER — Ambulatory Visit (INDEPENDENT_AMBULATORY_CARE_PROVIDER_SITE_OTHER): Payer: Medicare HMO | Admitting: *Deleted

## 2021-11-29 DIAGNOSIS — Z5181 Encounter for therapeutic drug level monitoring: Secondary | ICD-10-CM | POA: Diagnosis not present

## 2021-11-29 DIAGNOSIS — I4891 Unspecified atrial fibrillation: Secondary | ICD-10-CM

## 2021-11-29 LAB — CMP14+EGFR
ALT: 24 IU/L (ref 0–44)
AST: 39 IU/L (ref 0–40)
Albumin/Globulin Ratio: 1.6 (ref 1.2–2.2)
Albumin: 3.9 g/dL (ref 3.7–4.7)
Alkaline Phosphatase: 120 IU/L (ref 44–121)
BUN/Creatinine Ratio: 4 — ABNORMAL LOW (ref 10–24)
BUN: 3 mg/dL — ABNORMAL LOW (ref 8–27)
Bilirubin Total: 0.4 mg/dL (ref 0.0–1.2)
CO2: 22 mmol/L (ref 20–29)
Calcium: 9.1 mg/dL (ref 8.6–10.2)
Chloride: 103 mmol/L (ref 96–106)
Creatinine, Ser: 0.85 mg/dL (ref 0.76–1.27)
Globulin, Total: 2.5 g/dL (ref 1.5–4.5)
Glucose: 81 mg/dL (ref 70–99)
Potassium: 3.8 mmol/L (ref 3.5–5.2)
Sodium: 143 mmol/L (ref 134–144)
Total Protein: 6.4 g/dL (ref 6.0–8.5)
eGFR: 91 mL/min/{1.73_m2} (ref >59)

## 2021-11-29 LAB — CBC WITH DIFFERENTIAL/PLATELET
Basophils Absolute: 0 10*3/uL (ref 0.0–0.2)
Basos: 1 %
EOS (ABSOLUTE): 0.1 10*3/uL (ref 0.0–0.4)
Eos: 2 %
Hematocrit: 37 % — ABNORMAL LOW (ref 37.5–51.0)
Hemoglobin: 12.7 g/dL — ABNORMAL LOW (ref 13.0–17.7)
Immature Grans (Abs): 0 10*3/uL (ref 0.0–0.1)
Immature Granulocytes: 0 %
Lymphocytes Absolute: 2 10*3/uL (ref 0.7–3.1)
Lymphs: 34 %
MCH: 34.4 pg — ABNORMAL HIGH (ref 26.6–33.0)
MCHC: 34.3 g/dL (ref 31.5–35.7)
MCV: 100 fL — ABNORMAL HIGH (ref 79–97)
Monocytes Absolute: 0.4 10*3/uL (ref 0.1–0.9)
Monocytes: 7 %
Neutrophils Absolute: 3.4 10*3/uL (ref 1.4–7.0)
Neutrophils: 56 %
Platelets: 220 10*3/uL (ref 150–450)
RBC: 3.69 x10E6/uL — ABNORMAL LOW (ref 4.14–5.80)
RDW: 15.1 % (ref 11.6–15.4)
WBC: 6 10*3/uL (ref 3.4–10.8)

## 2021-11-29 LAB — LIPID PANEL
Chol/HDL Ratio: 2.4 ratio (ref 0.0–5.0)
Cholesterol, Total: 120 mg/dL (ref 100–199)
HDL: 49 mg/dL (ref >39)
LDL Chol Calc (NIH): 50 mg/dL (ref 0–99)
Triglycerides: 117 mg/dL (ref 0–149)
VLDL Cholesterol Cal: 21 mg/dL (ref 5–40)

## 2021-11-29 LAB — POCT INR: INR: 2.8 (ref 2.0–3.0)

## 2021-11-29 LAB — FERRITIN: Ferritin: 574 ng/mL — ABNORMAL HIGH (ref 30–400)

## 2021-11-29 LAB — BRAIN NATRIURETIC PEPTIDE: BNP: 146.4 pg/mL — ABNORMAL HIGH (ref 0.0–100.0)

## 2021-11-29 NOTE — Patient Instructions (Signed)
Continue warfarin 1/2  tablet daily except for 1 tablet on Mondays  ? Recheck INR in 5 weeks. ?

## 2021-11-29 NOTE — Progress Notes (Signed)
Hello Germain,  Your lab result is normal and/or stable.Some minor variations that are not significant are commonly marked abnormal, but do not represent any medical problem for you.  Best regards, Alaiya Martindelcampo, M.D.

## 2021-12-02 ENCOUNTER — Telehealth: Payer: Self-pay | Admitting: Family Medicine

## 2021-12-02 DIAGNOSIS — J449 Chronic obstructive pulmonary disease, unspecified: Secondary | ICD-10-CM

## 2021-12-02 NOTE — Telephone Encounter (Signed)
Patient's wife calling because Budeson-Glycopyrrol-Formoterol (BREZTRI AEROSPHERE) 160-9-4.8 MCG/ACT AERO was sent to [facility] and he is supposed to get that through a different company. She stated that Almyra Free takes care of this and she wants it canceled at [facility] because it is going to be too expensive there.  ?

## 2021-12-06 MED ORDER — BREZTRI AEROSPHERE 160-9-4.8 MCG/ACT IN AERO: 2.0000 | INHALATION_SPRAY | Freq: Two times a day (BID) | RESPIRATORY_TRACT | 5 refills | Status: DC

## 2021-12-06 NOTE — Telephone Encounter (Signed)
Patients wife aware

## 2021-12-06 NOTE — Telephone Encounter (Signed)
Please let patient know: ? ?-sent his BREZTRI rx to [facility] ?-I haven't seen him in over a year, they may have his app on file and be able to fill; he needs to call and check--[phone] ?-HOLD TIMES ARE LONG, but has to be doen ?-if not enrolled he can call back and make appt with me--they have to see me every year to redo paperwork ? ?Thanks! ?

## 2021-12-13 ENCOUNTER — Telehealth: Payer: Self-pay | Admitting: Family Medicine

## 2021-12-13 DIAGNOSIS — K921 Melena: Secondary | ICD-10-CM

## 2021-12-13 DIAGNOSIS — K625 Hemorrhage of anus and rectum: Secondary | ICD-10-CM

## 2021-12-13 DIAGNOSIS — R1030 Lower abdominal pain, unspecified: Secondary | ICD-10-CM

## 2021-12-13 MED ORDER — PANTOPRAZOLE SODIUM 40 MG PO TBEC: 40.0000 mg | DELAYED_RELEASE_TABLET | Freq: Every day | ORAL | 1 refills | Status: DC

## 2021-12-13 NOTE — Telephone Encounter (Signed)
?  Prescription Request ? ?12/13/2021 ? ? ?What is the name of the medication or equipment? PANTOPRAZOLE ? ?Have you contacted your pharmacy to request a refill? YES ? ?Which pharmacy would you like this sent to?  ?[facility] ? ? ?

## 2021-12-13 NOTE — Telephone Encounter (Signed)
Aware refill sent to mail order pharmacy 

## 2022-01-03 ENCOUNTER — Ambulatory Visit (INDEPENDENT_AMBULATORY_CARE_PROVIDER_SITE_OTHER): Payer: Medicare HMO | Admitting: *Deleted

## 2022-01-03 DIAGNOSIS — Z5181 Encounter for therapeutic drug level monitoring: Secondary | ICD-10-CM | POA: Diagnosis not present

## 2022-01-03 DIAGNOSIS — I4891 Unspecified atrial fibrillation: Secondary | ICD-10-CM

## 2022-01-03 LAB — POCT INR: INR: 3.3 — AB (ref 2.0–3.0)

## 2022-01-03 NOTE — Patient Instructions (Signed)
Hold warfarin tonight then resume 1/2  tablet daily except for 1 tablet on Mondays   Recheck INR in 5 weeks.

## 2022-02-02 ENCOUNTER — Ambulatory Visit: Payer: Medicare HMO | Admitting: Cardiology

## 2022-02-02 ENCOUNTER — Other Ambulatory Visit: Payer: Self-pay | Admitting: *Deleted

## 2022-02-02 ENCOUNTER — Encounter: Payer: Self-pay | Admitting: Cardiology

## 2022-02-02 VITALS — BP 134/70 | HR 79 | Ht 66.0 in | Wt 148.4 lb

## 2022-02-02 DIAGNOSIS — R0602 Shortness of breath: Secondary | ICD-10-CM | POA: Diagnosis not present

## 2022-02-02 DIAGNOSIS — I10 Essential (primary) hypertension: Secondary | ICD-10-CM

## 2022-02-02 DIAGNOSIS — E782 Mixed hyperlipidemia: Secondary | ICD-10-CM | POA: Diagnosis not present

## 2022-02-02 DIAGNOSIS — I2510 Atherosclerotic heart disease of native coronary artery without angina pectoris: Secondary | ICD-10-CM

## 2022-02-02 DIAGNOSIS — I4891 Unspecified atrial fibrillation: Secondary | ICD-10-CM | POA: Diagnosis not present

## 2022-02-02 MED ORDER — FUROSEMIDE 20 MG PO TABS: 20.0000 mg | ORAL_TABLET | ORAL | 2 refills | Status: DC | PRN

## 2022-02-02 NOTE — Addendum Note (Signed)
Addended by: Laurine Blazer on: 02/02/2022 01:48 PM   Modules accepted: Orders

## 2022-02-02 NOTE — Progress Notes (Signed)
Clinical Summary [name] is a 76 y.o.male seen today for follow up of the following medical problems.    1. CAD   - prior MI in 1997, prior CABG  - cath 2011 showed patent grafts. 04/2013 echo shows LVEF 60%   - myoview 12/2012 shows old inferolateral scar, no ischemia        - no chest pains.  - reports some recent SOB, new LE edema.  - compliant with meds  2. LE edema - started about 2 months ago, which is new for him - 12/2019 echo LVEF 59%, indet diastolic fxn.  - 11/2021 BNP 146   3. HTN    - lisionpril stopped by pcp per patient report. He did have some elevation of K after starting.     - compliant with meds   4. HL     05/2021 TC 121 TG 120 HDL 58 LDL 42 11/2021 TC 120 TG 117 HDL 49 LDL 50     4. COPD - followed by pcp - significant cough starting Friday - +cough, some SOB brownish. Some wheezing.    5. AAA screen - negative [place] 06/2016   6. Afib - new diagnosis during 11/2019 preop evaluation  08/11/19 DCCV succesful  - no recent palpitaiotns - compliant with coumadin, no recent bleeding.  Past Medical History:  Diagnosis Date   Anginal pain ([place])    Atrial fibrillation by electrocardiogram ([place]) 11/2019   Atrial flutter by electrocardiogram [facility]) 11/2019   Bladder cancer ([place]) 01/2020   Bladder tumor    BPH (benign prostatic hyperplasia)    CAD (coronary artery disease)    a. s/p INF MI 1997;  b. s/p CABG 2001;  c. [place] 11/2009:  3v CAD, S-PDA ok with 40% mid, S-OM ok, S-Dx ok, L-LAD ok, EF 50%;  d.  [facility] 5/14:  Inferolateral scar, EF 46%, no ischemia   COPD (chronic obstructive pulmonary disease) (HCC)    Dyspnea    ETOH abuse    GERD (gastroesophageal reflux disease)    History of gout    Hypercholesterolemia    Hypertension    Myocardial infarction [facility]) 2013   Prostate cancer (H[facility])    Tobacco abuse      No Known Allergies   Current Outpatient Medications  Medication Sig Dispense Refill   acetaminophen (TYLENOL) 500 MG tablet Take  2 tablets (1,000 mg total) by mouth every 6 (six) hours as needed for moderate pain or headache. 90 tablet 0   albuterol (VENTOLIN HFA) 108 (90 Base) MCG/ACT inhaler Inhale 2 puffs into the lungs every 6 (six) hours as needed for wheezing or shortness of breath. 3 each 3   allopurinol (ZYLOPRIM) 100 MG tablet Take 1 tablet (100 mg total) by mouth daily. 90 tablet 3   amLODipine (NORVASC) 5 MG tablet Take 1 tablet (5 mg total) by mouth daily. 90 tablet 3   atorvastatin (LIPITOR) 80 MG tablet TAKE 1 TABLET EVERY DAY AT 6 PM 90 tablet 3   B Complex Vitamins (VITAMIN B COMPLEX PO) Take 1 tablet by mouth daily.     Budeson-Glycopyrrol-Formoterol (BREZTRI AEROSPHERE) 160-9-4.8 MCG/ACT AERO Take 2 puffs by mouth in the morning and at bedtime. Dispense 3 inhalers 32.1 g 5   Cholecalciferol (VITAMIN D3) 125 MCG (5000 UT) CAPS Take 5,000 Units by mouth daily.     fluticasone (FLONASE) 50 MCG/ACT nasal spray Place 2 sprays into both nostrils daily as needed for allergies. 48 g 2  metoprolol tartrate (LOPRESSOR) 25 MG tablet Take 0.5 tablets (12.5 mg total) by mouth 2 (two) times daily. 90 tablet 3   nitroGLYCERIN (NITROSTAT) 0.4 MG SL tablet Place 1 tablet (0.4 mg total) under the tongue every 5 (five) minutes as needed for chest pain. 75 tablet 2   pantoprazole (PROTONIX) 40 MG tablet Take 1 tablet (40 mg total) by mouth daily. 90 tablet 1   warfarin (COUMADIN) 5 MG tablet Take 1 tablet (5 mg total) by mouth daily. (Patient taking differently: Take 2.5-5 mg by mouth See admin instructions. 5 mg on Friday, 2.5 mg all other days) 90 tablet 3   No current facility-administered medications for this visit.     Past Surgical History:  Procedure Laterality Date   ANTERIOR CERVICAL DECOMPRESSION/DISCECTOMY FUSION 4 LEVELS N/A 07/21/2016   Procedure: ANTERIOR CERVICAL DECOMPRESSION/DISCECTOMY FUSION CERVICAL TWO-THREE, CERVICAL THREE-FOUR,. CERVICAL FOUR-FIVE, CERVICAL  FIVE-SIX;  Surgeon: Ashok Pall, MD;   Location: [facility];  Service: Neurosurgery;  Laterality: N/A;   BACK SURGERY     CARDIAC CATHETERIZATION  2011   CARDIOVERSION N/A 06/10/2020   Procedure: CARDIOVERSION;  Surgeon: Arnoldo Lenis, MD;  [facility];  Service: Endoscopy;  Laterality: N/A;   COLONOSCOPY     in remote past, Dr. Sharlett Iles. Obtaining records.    COLONOSCOPY N/A 11/21/2017   Surgeon: Daneil Dolin, MD; Three 4-6 mm polyps in the rectum and splenic flexure resected and retrieved (tubular adenomas), nonbleeding internal hemorrhoids.  Repeat in 5 years.   COLONOSCOPY WITH PROPOFOL N/A 06/09/2021   Surgeon: Daneil Dolin, MD;  Three 3-7 mm polyps in the ascending colon resected and retrieved, mild radiation proctitis, otherwise normal exam. Pathology revealed tubular adenomas.  Recommended repeat colonoscopy in 5 years.   CORONARY ANGIOPLASTY     CORONARY ARTERY BYPASS GRAFT     ESOPHAGOGASTRODUODENOSCOPY  05/27/2012   ZWC:HENIDP esophagus, stomach and duodenum s/p dilator   POLYPECTOMY  11/21/2017   Procedure: POLYPECTOMY;  Surgeon: Daneil Dolin, MD;  [facility];  Service: Endoscopy;;  colon   POLYPECTOMY  06/09/2021   Procedure: POLYPECTOMY;  Surgeon: Daneil Dolin, MD;  [facility];  Service: Endoscopy;;   PROSTATE SURGERY     TRANSURETHRAL RESECTION OF BLADDER TUMOR WITH MITOMYCIN-C Left 01/06/2020   Procedure: CYSTOSCOPY TRANSURETHRAL RESECTION OF BLADDER TUMOR,LEFT URETERSCOPY AND GEMCITABINE;  Surgeon: Irine Seal, MD;  Location: [facility];  Service: Urology;  Laterality: Left;     No Known Allergies    Family History  Problem Relation Age of Onset   Diabetes Mother    Hypertension Father    Heart disease Brother    Diabetes Brother    Heart disease Brother    Diabetes Brother    Multiple sclerosis Daughter    Healthy Son    Colon cancer Neg Hx    Inflammatory bowel disease Neg Hx      Social History [name] reports that he has been smoking  cigarettes and e-cigarettes. He started smoking about 51 years ago. He has a 29.00 pack-year smoking history. He quit smokeless tobacco use about 9 years ago.  His smokeless tobacco use included snuff. [name] reports current alcohol use of about 2.0 - 4.0 standard drinks of alcohol per week.   Review of Systems CONSTITUTIONAL: No weight loss, fever, chills, weakness or fatigue.  HEENT: Eyes: No visual loss, blurred vision, double vision or yellow sclerae.No hearing loss, sneezing, congestion, runny nose or sore throat.  SKIN:  No rash or itching.  CARDIOVASCULAR: per hpi RESPIRATORY: per hpi GASTROINTESTINAL: No anorexia, nausea, vomiting or diarrhea. No abdominal pain or blood.  GENITOURINARY: No burning on urination, no polyuria NEUROLOGICAL: No headache, dizziness, syncope, paralysis, ataxia, numbness or tingling in the extremities. No change in bowel or bladder control.  MUSCULOSKELETAL: No muscle, back pain, joint pain or stiffness.  LYMPHATICS: No enlarged nodes. No history of splenectomy.  PSYCHIATRIC: No history of depression or anxiety.  ENDOCRINOLOGIC: No reports of sweating, cold or heat intolerance. No polyuria or polydipsia.  Marland Kitchen   Physical Examination Today's Vitals   02/02/22 1302  BP: (!) 142/78  Pulse: 79  SpO2: 96%  Weight: 148 lb 6.4 oz (67.3 kg)  Height: '5\' 6"'$  (1.676 m)   Body mass index is 23.95 kg/m.  Gen: resting comfortably, no acute distress HEENT: no scleral icterus, pupils equal round and reactive, no palptable cervical adenopathy,  CV: irreg, no m/r/g no jvd Resp: Clear to auscultation bilaterally GI: abdomen is soft, non-tender, non-distended, normal bowel sounds, no hepatosplenomegaly MSK: extremities are warm, no edema.  Skin: warm, no rash Neuro:  no focal deficits Psych: appropriate affect   Diagnostic Studies  11/25/2009:   Cardiac Cath Findings: Left ventricular angiogram was performed in the RAO projection and   showed normal left  ventricular systolic function with ejection   fraction estimated at 50%. Mild mitral regurgitation was noted.   10.Aortic root angiogram was performed and did not show enlargement of   the aortic root.   IMPRESSION:   1. Severe triple-vessel coronary artery disease.   2. Patent bypass grafts 4/4.   3. Low normal left ventricular systolic function.     12/2012 Myoview Inferolateral scar, no active ischemia, LVEF 46%.     05/01/13 Echo: LVEF 55-60%, grade II diastolic dysfunction, multiple WMAs, mild MR,     06/2016 AAA [place] No aneurysm   Assessment and Plan  1.CAD   - has not been on ACE-I due to prior elevatiion of potassium - no chest pains, continue current meds  2. SOB - recent SOB and new onset LE edema - will obtain echo, start lasix '20mg'$  prn   3. HTN - essentailly at goal, continue current meds   4. Hyperlipidemia - at goal, continue current meds   5. Afib -eliquis too expensive, changed to coumadin - EKG today shows rate controlled afib, no symptoms. COntinue curren tmeds - looks to be first documented recurrence today since Jan 2021 DCCV, no symptoms so just rate control at this time.          Arnoldo Lenis, M.D.

## 2022-02-02 NOTE — Patient Instructions (Addendum)
Medication Instructions:  Begin Lasix '20mg'$  as needed for swelling  Continue all other medications.     Labwork: none  Testing/Procedures: Your physician has requested that you have an echocardiogram. Echocardiography is a painless test that uses sound waves to create images of your heart. It provides your doctor with information about the size and shape of your heart and how well your heart's chambers and valves are working. This procedure takes approximately one hour. There are no restrictions for this procedure. Office will contact with results via phone or letter.     Follow-Up: 6 months   Any Other Special Instructions Will Be Listed Below (If Applicable).   If you need a refill on your cardiac medications before your next appointment, please call your pharmacy.

## 2022-02-03 ENCOUNTER — Ambulatory Visit: Payer: Medicare HMO | Admitting: Cardiology

## 2022-02-09 ENCOUNTER — Ambulatory Visit (INDEPENDENT_AMBULATORY_CARE_PROVIDER_SITE_OTHER): Payer: Medicare HMO

## 2022-02-09 ENCOUNTER — Ambulatory Visit (INDEPENDENT_AMBULATORY_CARE_PROVIDER_SITE_OTHER): Payer: Medicare HMO | Admitting: *Deleted

## 2022-02-09 DIAGNOSIS — R0602 Shortness of breath: Secondary | ICD-10-CM

## 2022-02-09 DIAGNOSIS — Z5181 Encounter for therapeutic drug level monitoring: Secondary | ICD-10-CM | POA: Diagnosis not present

## 2022-02-09 DIAGNOSIS — I4891 Unspecified atrial fibrillation: Secondary | ICD-10-CM | POA: Diagnosis not present

## 2022-02-09 LAB — POCT INR: INR: 3.1 — AB (ref 2.0–3.0)

## 2022-02-09 NOTE — Patient Instructions (Signed)
Continue warfarin 1/2  tablet daily except for 1 tablet on Mondays  Increase greens  Recheck INR in 4 weeks.

## 2022-02-10 LAB — ECHOCARDIOGRAM COMPLETE
Area-P 1/2: 6.37 cm2
Calc EF: 36.9 %
MV M vel: 5.49 m/s
MV Peak grad: 120.7 mmHg
MV Vena cont: 0.25 cm
Radius: 0.3 cm
S' Lateral: 3.65 cm
Single Plane A2C EF: 38.9 %
Single Plane A4C EF: 33.8 %

## 2022-03-07 ENCOUNTER — Telehealth: Payer: Self-pay | Admitting: *Deleted

## 2022-03-07 NOTE — Telephone Encounter (Signed)
-----   Message from Arnoldo Lenis, MD sent at 03/02/2022  8:21 AM EDT ----- Echo shows very mild decrease in heart pumping function, we will need to adjust some medications to work to strengthen it. Could he see me in clinic Aug 1st at noon.  [name] MD

## 2022-03-07 NOTE — Telephone Encounter (Signed)
Laurine Blazer, LPN  12/06/8982  2:10 AM EDT Back to Top    Notified, copy to pcp. Appointment scheduled for 03/08/22 at noon per JB.

## 2022-03-08 ENCOUNTER — Ambulatory Visit: Payer: Medicare HMO | Admitting: Cardiology

## 2022-03-08 ENCOUNTER — Encounter: Payer: Self-pay | Admitting: Cardiology

## 2022-03-08 ENCOUNTER — Ambulatory Visit (INDEPENDENT_AMBULATORY_CARE_PROVIDER_SITE_OTHER): Payer: Medicare HMO | Admitting: *Deleted

## 2022-03-08 VITALS — BP 104/70 | HR 80 | Ht 66.0 in | Wt 146.2 lb

## 2022-03-08 DIAGNOSIS — I5022 Chronic systolic (congestive) heart failure: Secondary | ICD-10-CM

## 2022-03-08 DIAGNOSIS — I4891 Unspecified atrial fibrillation: Secondary | ICD-10-CM

## 2022-03-08 DIAGNOSIS — Z5181 Encounter for therapeutic drug level monitoring: Secondary | ICD-10-CM | POA: Diagnosis not present

## 2022-03-08 LAB — POCT INR: INR: 1.8 — AB (ref 2.0–3.0)

## 2022-03-08 MED ORDER — METOPROLOL SUCCINATE ER 25 MG PO TB24: 25.0000 mg | ORAL_TABLET | Freq: Every day | ORAL | 0 refills | Status: DC

## 2022-03-08 MED ORDER — SPIRONOLACTONE 25 MG PO TABS: 12.5000 mg | ORAL_TABLET | Freq: Every day | ORAL | 0 refills | Status: DC

## 2022-03-08 MED ORDER — METOPROLOL SUCCINATE ER 25 MG PO TB24: 25.0000 mg | ORAL_TABLET | Freq: Every day | ORAL | 2 refills | Status: DC

## 2022-03-08 NOTE — Addendum Note (Signed)
Addended by: Sung Amabile on: 03/08/2022 12:35 PM   Modules accepted: Orders

## 2022-03-08 NOTE — Patient Instructions (Signed)
Take warfarin 1 tablet tonight then resume 1/2  tablet daily except for 1 tablet on Mondays  Decrease greens  Recheck INR in 4 weeks.

## 2022-03-08 NOTE — Progress Notes (Signed)
Clinical Summary [name] is a 76 y.o.male seen today for follow up of the following medical problems.    This is a focused visit for recent issues with LE edema, SOB, and abnormal echo.        1. LE edema/New diagnosis chronic systolic HF - started about 2 months ago, which is new for him - 12/2019 echo LVEF 58%, indet diastolic fxn.  - 11/2021 BNP 146  02/2022 echo LVEF 40-45%, mild RV dysfunction, mild to mod MR - has prn lasix, uses sparingly.  - some ongoing SOB.    Medical issues not addressed this visit  1. CAD   - prior MI in 1997, prior CABG  - cath 2011 showed patent grafts. 04/2013 echo shows LVEF 60%   - myoview 12/2012 shows old inferolateral scar, no ischemia        - no chest pains.  - reports some recent SOB, new LE edema.  - compliant with meds  =- no recent pains.       3. HTN    - lisionpril stopped by pcp per patient report. He did have some elevation of K after starting.     - compliant with meds   4. HL     05/2021 TC 121 TG 120 HDL 58 LDL 42 11/2021 TC 120 TG 117 HDL 49 LDL 50     4. COPD - followed by pcp - significant cough starting Friday - +cough, some SOB brownish. Some wheezing.    5. AAA screen - negative [place] 06/2016   6. Afib - new diagnosis during 11/2019 preop evaluation  08/11/19 DCCV succesful   - no recent palpitaiotns - compliant with coumadin, no recent bleeding.  Past Medical History:  Diagnosis Date   Anginal pain ([place])    Atrial fibrillation by electrocardiogram ([place]) 11/2019   Atrial flutter by electrocardiogram [facility]) 11/2019   Bladder cancer ([place]) 01/2020   Bladder tumor    BPH (benign prostatic hyperplasia)    CAD (coronary artery disease)    a. s/p INF MI 1997;  b. s/p CABG 2001;  c. [place] 11/2009:  3v CAD, S-PDA ok with 40% mid, S-OM ok, S-Dx ok, L-LAD ok, EF 50%;  d.  [place] 5/14:  Inferolateral scar, EF 46%, no ischemia   COPD (chronic obstructive pulmonary disease) (HCC)    Dyspnea    ETOH abuse     GERD (gastroesophageal reflux disease)    History of gout    Hypercholesterolemia    Hypertension    Myocardial infarction [facility]) 2013   Prostate cancer (HCC)    Tobacco abuse      No Known Allergies   Current Outpatient Medications  Medication Sig Dispense Refill   acetaminophen (TYLENOL) 500 MG tablet Take 2 tablets (1,000 mg total) by mouth every 6 (six) hours as needed for moderate pain or headache. 90 tablet 0   albuterol (VENTOLIN HFA) 108 (90 Base) MCG/ACT inhaler Inhale 2 puffs into the lungs every 6 (six) hours as needed for wheezing or shortness of breath. 3 each 3   allopurinol (ZYLOPRIM) 100 MG tablet Take 1 tablet (100 mg total) by mouth daily. 90 tablet 3   amLODipine (NORVASC) 5 MG tablet Take 1 tablet (5 mg total) by mouth daily. 90 tablet 3   atorvastatin (LIPITOR) 80 MG tablet TAKE 1 TABLET EVERY DAY AT 6 PM 90 tablet 3   B Complex Vitamins (VITAMIN B COMPLEX PO) Take 1 tablet by mouth daily.  Budeson-Glycopyrrol-Formoterol (BREZTRI AEROSPHERE) 160-9-4.8 MCG/ACT AERO Take 2 puffs by mouth in the morning and at bedtime. Dispense 3 inhalers 32.1 g 5   Cholecalciferol (VITAMIN D3) 125 MCG (5000 UT) CAPS Take 5,000 Units by mouth daily.     fluticasone (FLONASE) 50 MCG/ACT nasal spray Place 2 sprays into both nostrils daily as needed for allergies. 48 g 2   furosemide (LASIX) 20 MG tablet Take 1 tablet (20 mg total) by mouth as needed (swelling). 30 tablet 2   metoprolol tartrate (LOPRESSOR) 25 MG tablet Take 0.5 tablets (12.5 mg total) by mouth 2 (two) times daily. 90 tablet 3   nitroGLYCERIN (NITROSTAT) 0.4 MG SL tablet Place 1 tablet (0.4 mg total) under the tongue every 5 (five) minutes as needed for chest pain. 75 tablet 2   pantoprazole (PROTONIX) 40 MG tablet Take 1 tablet (40 mg total) by mouth daily. 90 tablet 1   warfarin (COUMADIN) 5 MG tablet Take 1 tablet (5 mg total) by mouth daily. (Patient taking differently: Take 2.5-5 mg by mouth See admin instructions. 5  mg on Friday, 2.5 mg all other days) 90 tablet 3   No current facility-administered medications for this visit.     Past Surgical History:  Procedure Laterality Date   ANTERIOR CERVICAL DECOMPRESSION/DISCECTOMY FUSION 4 LEVELS N/A 07/21/2016   Procedure: ANTERIOR CERVICAL DECOMPRESSION/DISCECTOMY FUSION CERVICAL TWO-THREE, CERVICAL THREE-FOUR,. CERVICAL FOUR-FIVE, CERVICAL  FIVE-SIX;  Surgeon: Ashok Pall, MD;  Location: [place];  Service: Neurosurgery;  Laterality: N/A;   BACK SURGERY     CARDIAC CATHETERIZATION  2011   CARDIOVERSION N/A 06/10/2020   Procedure: CARDIOVERSION;  Surgeon: Arnoldo Lenis, MD;  [facility];  Service: Endoscopy;  Laterality: N/A;   COLONOSCOPY     in remote past, Dr. Sharlett Iles. Obtaining records.    COLONOSCOPY N/A 11/21/2017   Surgeon: Daneil Dolin, MD; Three 4-6 mm polyps in the rectum and splenic flexure resected and retrieved (tubular adenomas), nonbleeding internal hemorrhoids.  Repeat in 5 years.   COLONOSCOPY WITH PROPOFOL N/A 06/09/2021   Surgeon: Daneil Dolin, MD;  Three 3-7 mm polyps in the ascending colon resected and retrieved, mild radiation proctitis, otherwise normal exam. Pathology revealed tubular adenomas.  Recommended repeat colonoscopy in 5 years.   CORONARY ANGIOPLASTY     CORONARY ARTERY BYPASS GRAFT     ESOPHAGOGASTRODUODENOSCOPY  05/27/2012   KDT:OIZTIW esophagus, stomach and duodenum s/p dilator   POLYPECTOMY  11/21/2017   Procedure: POLYPECTOMY;  Surgeon: Daneil Dolin, MD;  [facility];  Service: Endoscopy;;  colon   POLYPECTOMY  06/09/2021   Procedure: POLYPECTOMY;  Surgeon: Daneil Dolin, MD;  [facility];  Service: Endoscopy;;   PROSTATE SURGERY     TRANSURETHRAL RESECTION OF BLADDER TUMOR WITH MITOMYCIN-C Left 01/06/2020   Procedure: CYSTOSCOPY TRANSURETHRAL RESECTION OF BLADDER TUMOR,LEFT URETERSCOPY AND GEMCITABINE;  Surgeon: Irine Seal, MD;  Location: [facility];  Service:  Urology;  Laterality: Left;     No Known Allergies    Family History  Problem Relation Age of Onset   Diabetes Mother    Hypertension Father    Heart disease Brother    Diabetes Brother    Heart disease Brother    Diabetes Brother    Multiple sclerosis Daughter    Healthy Son    Colon cancer Neg Hx    Inflammatory bowel disease Neg Hx      Social History [name] reports that he has been smoking cigarettes and  e-cigarettes. He started smoking about 52 years ago. He has a 29.00 pack-year smoking history. He quit smokeless tobacco use about 10 years ago.  His smokeless tobacco use included snuff. [name] reports current alcohol use of about 2.0 - 4.0 standard drinks of alcohol per week.   Review of Systems CONSTITUTIONAL: No weight loss, fever, chills, weakness or fatigue.  HEENT: Eyes: No visual loss, blurred vision, double vision or yellow sclerae.No hearing loss, sneezing, congestion, runny nose or sore throat.  SKIN: No rash or itching.  CARDIOVASCULAR: per hpi RESPIRATORY: No shortness of breath, cough or sputum.  GASTROINTESTINAL: No anorexia, nausea, vomiting or diarrhea. No abdominal pain or blood.  GENITOURINARY: No burning on urination, no polyuria NEUROLOGICAL: No headache, dizziness, syncope, paralysis, ataxia, numbness or tingling in the extremities. No change in bowel or bladder control.  MUSCULOSKELETAL: No muscle, back pain, joint pain or stiffness.  LYMPHATICS: No enlarged nodes. No history of splenectomy.  PSYCHIATRIC: No history of depression or anxiety.  ENDOCRINOLOGIC: No reports of sweating, cold or heat intolerance. No polyuria or polydipsia.  Marland Kitchen   Physical Examination Today's Vitals   03/08/22 1129  BP: 104/70  Pulse: 80  SpO2: 97%  Weight: 146 lb 3.2 oz (66.3 kg)  Height: '5\' 6"'$  (1.676 m)   Body mass index is 23.6 kg/m.  Gen: resting comfortably, no acute distress HEENT: no scleral icterus, pupils equal round and reactive, no palptable  cervical adenopathy,  CV: RRR, no m/r/ gno jvd Resp: Clear to auscultation bilaterally GI: abdomen is soft, non-tender, non-distended, normal bowel sounds, no hepatosplenomegaly MSK: extremities are warm, no edema.  Skin: warm, no rash Neuro:  no focal deficits Psych: appropriate affect   Diagnostic Studies 11/25/2009:   Cardiac Cath Findings: Left ventricular angiogram was performed in the RAO projection and   showed normal left ventricular systolic function with ejection   fraction estimated at 50%. Mild mitral regurgitation was noted.   10.Aortic root angiogram was performed and did not show enlargement of   the aortic root.   IMPRESSION:   1. Severe triple-vessel coronary artery disease.   2. Patent bypass grafts 4/4.   3. Low normal left ventricular systolic function.     12/2012 Myoview Inferolateral scar, no active ischemia, LVEF 46%.     05/01/13 Echo: LVEF 55-60%, grade II diastolic dysfunction, multiple WMAs, mild MR,     06/2016 AAA [place] No aneurysm   02/2022 echo IMPRESSIONS     1. Left ventricular ejection fraction, by estimation, is 40 to 45%. The  left ventricle has mildly decreased function. The left ventricle  demonstrates regional wall motion abnormalities (see scoring  diagram/findings for description). There is mild  asymmetric left ventricular hypertrophy of the basal segment. Left  ventricular diastolic parameters are indeterminate. The average left  ventricular global longitudinal strain is -10.0 %. The global longitudinal  strain is abnormal.   2. Right ventricular systolic function is mildly reduced. The right  ventricular size is normal. There is normal pulmonary artery systolic  pressure. The estimated right ventricular systolic pressure is 19.1 mmHg.   3. Left atrial size was mildly dilated.   4. The mitral valve is grossly normal. Mild to moderate mitral valve  regurgitation.   5. Tricuspid valve regurgitation is mild to moderate.   6. The  aortic valve is tricuspid. Aortic valve regurgitation is not  visualized.   7. The inferior vena cava is normal in size with greater than 50%  respiratory variability, suggesting right atrial  pressure of 3 mmHg.     Assessment and Plan   1.New diagnosis chronic systolic HF - echo LVEF 87-68% - some recent SOB/DOE, LE edema - appears euvolemic today, he has prn lasix -stop lopressor, start toprol '25mg'$  daily. Stop norvasc, start aldactone 12.'5mg'$  daily - prior elevated K on ACEi, may try low dose ARB next visit with close monitoring. Likely SLGT2i next visit - given just mild dysfunction and symptoms would work with medications initially to see if LVEF improves and defer ischemic testing at this time.         Arnoldo Lenis, M.D.

## 2022-03-08 NOTE — Patient Instructions (Signed)
Medication Instructions:  Your physician has recommended you make the following change in your medication:  Stop Norvasc Stop lopressor Start aldactone 12.5 mg once a day Start Toprol 25 mg once a day Continue all other medications as directed  Labwork: BMET, BNP (in 2 weeks at [place])  Testing/Procedures: none  Follow-Up:  Your physician recommends that you schedule a follow-up appointment in: 3-4 weeks  Any Other Special Instructions Will Be Listed Below (If Applicable).  If you need a refill on your cardiac medications before your next appointment, please call your pharmacy.

## 2022-03-25 ENCOUNTER — Emergency Department (HOSPITAL_COMMUNITY): Payer: Medicare HMO

## 2022-03-25 ENCOUNTER — Encounter (HOSPITAL_COMMUNITY): Payer: Self-pay

## 2022-03-25 ENCOUNTER — Inpatient Hospital Stay (HOSPITAL_COMMUNITY)
Admission: EM | Admit: 2022-03-25 | Discharge: 2022-03-28 | DRG: 247 | Disposition: A | Discharge status: Home / Self Care | Payer: Medicare HMO | Attending: Internal Medicine | Admitting: Internal Medicine

## 2022-03-25 DIAGNOSIS — Z8249 Family history of ischemic heart disease and other diseases of the circulatory system: Secondary | ICD-10-CM

## 2022-03-25 DIAGNOSIS — E785 Hyperlipidemia, unspecified: Secondary | ICD-10-CM | POA: Diagnosis present

## 2022-03-25 DIAGNOSIS — I2582 Chronic total occlusion of coronary artery: Secondary | ICD-10-CM | POA: Diagnosis present

## 2022-03-25 DIAGNOSIS — I272 Pulmonary hypertension, unspecified: Secondary | ICD-10-CM | POA: Diagnosis present

## 2022-03-25 DIAGNOSIS — Z79899 Other long term (current) drug therapy: Secondary | ICD-10-CM

## 2022-03-25 DIAGNOSIS — Z8551 Personal history of malignant neoplasm of bladder: Secondary | ICD-10-CM

## 2022-03-25 DIAGNOSIS — K219 Gastro-esophageal reflux disease without esophagitis: Secondary | ICD-10-CM | POA: Diagnosis present

## 2022-03-25 DIAGNOSIS — E782 Mixed hyperlipidemia: Secondary | ICD-10-CM | POA: Diagnosis present

## 2022-03-25 DIAGNOSIS — R0789 Other chest pain: Secondary | ICD-10-CM | POA: Diagnosis not present

## 2022-03-25 DIAGNOSIS — I110 Hypertensive heart disease with heart failure: Secondary | ICD-10-CM | POA: Diagnosis present

## 2022-03-25 DIAGNOSIS — F17200 Nicotine dependence, unspecified, uncomplicated: Secondary | ICD-10-CM | POA: Diagnosis present

## 2022-03-25 DIAGNOSIS — E7800 Pure hypercholesterolemia, unspecified: Secondary | ICD-10-CM | POA: Diagnosis present

## 2022-03-25 DIAGNOSIS — Z7901 Long term (current) use of anticoagulants: Secondary | ICD-10-CM | POA: Diagnosis not present

## 2022-03-25 DIAGNOSIS — Z66 Do not resuscitate: Secondary | ICD-10-CM | POA: Diagnosis present

## 2022-03-25 DIAGNOSIS — I200 Unstable angina: Secondary | ICD-10-CM | POA: Diagnosis not present

## 2022-03-25 DIAGNOSIS — I25710 Atherosclerosis of autologous vein coronary artery bypass graft(s) with unstable angina pectoris: Secondary | ICD-10-CM | POA: Diagnosis present

## 2022-03-25 DIAGNOSIS — R079 Chest pain, unspecified: Secondary | ICD-10-CM | POA: Diagnosis present

## 2022-03-25 DIAGNOSIS — N400 Enlarged prostate without lower urinary tract symptoms: Secondary | ICD-10-CM | POA: Diagnosis present

## 2022-03-25 DIAGNOSIS — I4819 Other persistent atrial fibrillation: Secondary | ICD-10-CM | POA: Diagnosis present

## 2022-03-25 DIAGNOSIS — E876 Hypokalemia: Secondary | ICD-10-CM | POA: Diagnosis present

## 2022-03-25 DIAGNOSIS — Z7951 Long term (current) use of inhaled steroids: Secondary | ICD-10-CM

## 2022-03-25 DIAGNOSIS — Z981 Arthrodesis status: Secondary | ICD-10-CM

## 2022-03-25 DIAGNOSIS — I25118 Atherosclerotic heart disease of native coronary artery with other forms of angina pectoris: Secondary | ICD-10-CM

## 2022-03-25 DIAGNOSIS — Z820 Family history of epilepsy and other diseases of the nervous system: Secondary | ICD-10-CM

## 2022-03-25 DIAGNOSIS — J449 Chronic obstructive pulmonary disease, unspecified: Secondary | ICD-10-CM | POA: Diagnosis present

## 2022-03-25 DIAGNOSIS — I252 Old myocardial infarction: Secondary | ICD-10-CM

## 2022-03-25 DIAGNOSIS — I10 Essential (primary) hypertension: Secondary | ICD-10-CM | POA: Diagnosis present

## 2022-03-25 DIAGNOSIS — I482 Chronic atrial fibrillation, unspecified: Secondary | ICD-10-CM

## 2022-03-25 DIAGNOSIS — I25110 Atherosclerotic heart disease of native coronary artery with unstable angina pectoris: Secondary | ICD-10-CM | POA: Diagnosis present

## 2022-03-25 DIAGNOSIS — Z955 Presence of coronary angioplasty implant and graft: Secondary | ICD-10-CM

## 2022-03-25 DIAGNOSIS — G4440 Drug-induced headache, not elsewhere classified, not intractable: Secondary | ICD-10-CM | POA: Diagnosis present

## 2022-03-25 DIAGNOSIS — T463X5A Adverse effect of coronary vasodilators, initial encounter: Secondary | ICD-10-CM | POA: Diagnosis present

## 2022-03-25 DIAGNOSIS — I5022 Chronic systolic (congestive) heart failure: Secondary | ICD-10-CM | POA: Diagnosis present

## 2022-03-25 DIAGNOSIS — M109 Gout, unspecified: Secondary | ICD-10-CM | POA: Diagnosis present

## 2022-03-25 DIAGNOSIS — F17210 Nicotine dependence, cigarettes, uncomplicated: Secondary | ICD-10-CM | POA: Diagnosis present

## 2022-03-25 DIAGNOSIS — E875 Hyperkalemia: Secondary | ICD-10-CM | POA: Diagnosis present

## 2022-03-25 DIAGNOSIS — Z8546 Personal history of malignant neoplasm of prostate: Secondary | ICD-10-CM

## 2022-03-25 DIAGNOSIS — I4811 Longstanding persistent atrial fibrillation: Secondary | ICD-10-CM | POA: Diagnosis not present

## 2022-03-25 DIAGNOSIS — Z833 Family history of diabetes mellitus: Secondary | ICD-10-CM | POA: Diagnosis not present

## 2022-03-25 DIAGNOSIS — I4891 Unspecified atrial fibrillation: Secondary | ICD-10-CM | POA: Diagnosis not present

## 2022-03-25 DIAGNOSIS — Z9049 Acquired absence of other specified parts of digestive tract: Secondary | ICD-10-CM

## 2022-03-25 DIAGNOSIS — Z720 Tobacco use: Secondary | ICD-10-CM | POA: Diagnosis present

## 2022-03-25 DIAGNOSIS — I2510 Atherosclerotic heart disease of native coronary artery without angina pectoris: Secondary | ICD-10-CM | POA: Diagnosis present

## 2022-03-25 LAB — CBC WITH DIFFERENTIAL/PLATELET
Abs Immature Granulocytes: 0.02 10*3/uL (ref 0.00–0.07)
Basophils Absolute: 0.1 10*3/uL (ref 0.0–0.1)
Basophils Relative: 1 %
Eosinophils Absolute: 0.2 10*3/uL (ref 0.0–0.5)
Eosinophils Relative: 2 %
HCT: 37 % — ABNORMAL LOW (ref 39.0–52.0)
Hemoglobin: 13.1 g/dL (ref 13.0–17.0)
Immature Granulocytes: 0 %
Lymphocytes Relative: 22 %
Lymphs Abs: 1.8 10*3/uL (ref 0.7–4.0)
MCH: 34.3 pg — ABNORMAL HIGH (ref 26.0–34.0)
MCHC: 35.4 g/dL (ref 30.0–36.0)
MCV: 96.9 fL (ref 80.0–100.0)
Monocytes Absolute: 0.4 10*3/uL (ref 0.1–1.0)
Monocytes Relative: 6 %
Neutro Abs: 5.6 10*3/uL (ref 1.7–7.7)
Neutrophils Relative %: 69 %
Platelets: 193 10*3/uL (ref 150–400)
RBC: 3.82 MIL/uL — ABNORMAL LOW (ref 4.22–5.81)
RDW: 13.9 % (ref 11.5–15.5)
WBC: 8 10*3/uL (ref 4.0–10.5)
nRBC: 0 % (ref 0.0–0.2)

## 2022-03-25 LAB — MAGNESIUM: Magnesium: 1.1 mg/dL — ABNORMAL LOW (ref 1.7–2.4)

## 2022-03-25 LAB — PROTIME-INR
INR: 2.7 — ABNORMAL HIGH (ref 0.8–1.2)
Prothrombin Time: 28.3 seconds — ABNORMAL HIGH (ref 11.4–15.2)

## 2022-03-25 LAB — COMPREHENSIVE METABOLIC PANEL
ALT: 25 U/L (ref 0–44)
AST: 37 U/L (ref 15–41)
Albumin: 3.9 g/dL (ref 3.5–5.0)
Alkaline Phosphatase: 107 U/L (ref 38–126)
Anion gap: 9 (ref 5–15)
BUN: 7 mg/dL — ABNORMAL LOW (ref 8–23)
CO2: 31 mmol/L (ref 22–32)
Calcium: 9.4 mg/dL (ref 8.9–10.3)
Chloride: 101 mmol/L (ref 98–111)
Creatinine, Ser: 1.08 mg/dL (ref 0.61–1.24)
GFR, Estimated: >60 mL/min (ref >60)
Glucose, Bld: 129 mg/dL — ABNORMAL HIGH (ref 70–99)
Potassium: 3.1 mmol/L — ABNORMAL LOW (ref 3.5–5.1)
Sodium: 141 mmol/L (ref 135–145)
Total Bilirubin: 1.1 mg/dL (ref 0.3–1.2)
Total Protein: 7.7 g/dL (ref 6.5–8.1)

## 2022-03-25 LAB — TROPONIN I (HIGH SENSITIVITY)
Troponin I (High Sensitivity): 21 ng/L — ABNORMAL HIGH (ref <18)
Troponin I (High Sensitivity): 20 ng/L — ABNORMAL HIGH (ref <18)

## 2022-03-25 MED ORDER — POTASSIUM CHLORIDE 20 MEQ PO PACK
60.0000 meq | PACK | Freq: Once | ORAL | Status: AC
  Administered 2022-03-25: 60 meq via ORAL
  Filled 2022-03-25: qty 3

## 2022-03-25 MED ORDER — ATORVASTATIN CALCIUM 80 MG PO TABS
80.0000 mg | ORAL_TABLET | Freq: Every day | ORAL | Status: DC
  Administered 2022-03-26: 80 mg via ORAL
  Filled 2022-03-25: qty 2

## 2022-03-25 MED ORDER — NITROGLYCERIN 0.4 MG SL SUBL
0.4000 mg | SUBLINGUAL_TABLET | SUBLINGUAL | Status: DC | PRN
Start: 2022-03-25 — End: 2022-03-28
  Administered 2022-03-27 (×2): 0.4 mg via SUBLINGUAL
  Filled 2022-03-25: qty 1

## 2022-03-25 MED ORDER — ALUM & MAG HYDROXIDE-SIMETH 200-200-20 MG/5ML PO SUSP
30.0000 mL | Freq: Once | ORAL | Status: AC
  Administered 2022-03-25: 30 mL via ORAL
  Filled 2022-03-25: qty 30

## 2022-03-25 MED ORDER — MAGNESIUM SULFATE 2 GM/50ML IV SOLN
2.0000 g | Freq: Once | INTRAVENOUS | Status: AC
  Administered 2022-03-25: 2 g via INTRAVENOUS
  Filled 2022-03-25: qty 50

## 2022-03-25 MED ORDER — BUDESON-GLYCOPYRROL-FORMOTEROL 160-9-4.8 MCG/ACT IN AERO: 2.0000 | INHALATION_SPRAY | Freq: Two times a day (BID) | RESPIRATORY_TRACT | Status: DC

## 2022-03-25 MED ORDER — ACETAMINOPHEN 325 MG PO TABS
650.0000 mg | ORAL_TABLET | ORAL | Status: DC | PRN
Start: 2022-03-25 — End: 2022-03-28
  Administered 2022-03-26 – 2022-03-27 (×3): 650 mg via ORAL
  Filled 2022-03-25 (×2): qty 2

## 2022-03-25 MED ORDER — SPIRONOLACTONE 12.5 MG HALF TABLET
12.5000 mg | ORAL_TABLET | Freq: Every day | ORAL | Status: DC
Start: 2022-03-26 — End: 2022-03-26

## 2022-03-25 MED ORDER — NITROGLYCERIN 0.4 MG SL SUBL
0.4000 mg | SUBLINGUAL_TABLET | Freq: Once | SUBLINGUAL | Status: AC
  Administered 2022-03-25: 0.4 mg via SUBLINGUAL
  Filled 2022-03-25: qty 1

## 2022-03-25 MED ORDER — WARFARIN SODIUM 2.5 MG PO TABS: 2.5000 mg | ORAL_TABLET | ORAL | Status: DC

## 2022-03-25 MED ORDER — ONDANSETRON HCL 4 MG/2ML IJ SOLN
4.0000 mg | Freq: Four times a day (QID) | INTRAMUSCULAR | Status: DC | PRN
  Administered 2022-03-27: 4 mg via INTRAVENOUS
  Filled 2022-03-25: qty 2

## 2022-03-25 MED ORDER — METOPROLOL SUCCINATE ER 25 MG PO TB24
25.0000 mg | ORAL_TABLET | Freq: Every day | ORAL | Status: DC
  Administered 2022-03-26 – 2022-03-27 (×2): 25 mg via ORAL
  Filled 2022-03-25 (×2): qty 1

## 2022-03-25 MED ORDER — NICOTINE 14 MG/24HR TD PT24
14.0000 mg | MEDICATED_PATCH | Freq: Every day | TRANSDERMAL | Status: DC
  Administered 2022-03-25 – 2022-03-28 (×4): 14 mg via TRANSDERMAL
  Filled 2022-03-25 (×4): qty 1

## 2022-03-25 MED ORDER — PANTOPRAZOLE SODIUM 40 MG PO TBEC
40.0000 mg | DELAYED_RELEASE_TABLET | Freq: Every day | ORAL | Status: DC
  Administered 2022-03-26 – 2022-03-28 (×3): 40 mg via ORAL
  Filled 2022-03-25 (×3): qty 1

## 2022-03-25 MED ORDER — HEPARIN SODIUM (PORCINE) 5000 UNIT/ML IJ SOLN: 5000.0000 [IU] | Freq: Three times a day (TID) | INTRAMUSCULAR | Status: DC

## 2022-03-25 NOTE — ED Triage Notes (Signed)
Pt states sternal chest pain that has moved to left sided chest pain since yesterday. Pt has hx of cardiac issues- was told he may need stents. Endorses SHOB.

## 2022-03-25 NOTE — Assessment & Plan Note (Signed)
-  Still having chest pain this morning, nitroglycerin and aspirin given, repeating EKG (seems to be atypical)  -Been seen and evaluated cardiology, planning for cardiac catheterization today 03/27/2022 at [facility]  -Troponins downtrending 21, 20, 17, 20 -Lipid panel >> LDL: 51 -On Coumadin, INR 2.3   -Monitor on telemetry

## 2022-03-25 NOTE — Assessment & Plan Note (Signed)
-   Continue albuterol as needed -Continue budesonide-glycopyrrolate-formoterol inhaler -Counseled on smoking cessation

## 2022-03-25 NOTE — Assessment & Plan Note (Signed)
-   3.1 on chemistry -60 mEq replaced in the ED -Mag sulfate also given in the ED -Trending both on morning labs

## 2022-03-25 NOTE — Assessment & Plan Note (Addendum)
-   Continue statin Lipid Panel     Component Value Date/Time   CHOL 116 03/26/2022 0514   CHOL 120 11/28/2021 1345   TRIG 108 03/26/2022 0514   HDL 43 03/26/2022 0514   HDL 49 11/28/2021 1345   CHOLHDL 2.7 03/26/2022 0514   VLDL 22 03/26/2022 0514   LDLCALC 51 03/26/2022 0514   LDLCALC 50 11/28/2021 1345   LABVLDL 21 11/28/2021 1345

## 2022-03-25 NOTE — Assessment & Plan Note (Signed)
-   Continue Toprol, spironolactone

## 2022-03-25 NOTE — Assessment & Plan Note (Signed)
-   Stable - EKG in rate controlled A-fib - Continue metoprolol - Continue warfarin >> holding for today possibility of cardiac cath

## 2022-03-25 NOTE — Assessment & Plan Note (Signed)
Continue Protonix °

## 2022-03-25 NOTE — ED Provider Notes (Cosign Needed Addendum)
[facility] EMERGENCY DEPARTMENT Provider Note   CSN: [id number] Arrival date & time: 03/25/22  8841     History Chief Complaint  Patient presents with   Chest Pain    [name] is a 76 y.o. male with history of CAD, COPD, GERD, atrial fibrillation on Coumadin who presents to the emergency department for further evaluation of left-sided chest pain started last night.  Patient states that initially, he started having substernal chest pain which she describes as sharp and burning.  He had some nausea associated with this.  This morning he woke up and was still having chest pain or, it was more on the left side of his chest and occasionally radiates into the neck.  He denies nausea vomiting now, diarrhea, diaphoresis, LOC. No leg swelling.   Chest Pain      Home Medications Prior to Admission medications   Medication Sig Start Date End Date Taking? Authorizing Provider  acetaminophen (TYLENOL) 500 MG tablet Take 2 tablets (1,000 mg total) by mouth every 6 (six) hours as needed for moderate pain or headache. 03/03/20   [name], MD  albuterol (VENTOLIN HFA) 108 (90 Base) MCG/ACT inhaler Inhale 2 puffs into the lungs every 6 (six) hours as needed for wheezing or shortness of breath. 11/28/21   [name], MD  allopurinol (ZYLOPRIM) 100 MG tablet Take 1 tablet (100 mg total) by mouth daily. 05/30/21   [name], MD  atorvastatin (LIPITOR) 80 MG tablet TAKE 1 TABLET EVERY DAY AT 6 PM 05/30/21   [name], MD  B Complex Vitamins (VITAMIN B COMPLEX PO) Take 1 tablet by mouth daily.    [provider]  Budeson-Glycopyrrol-Formoterol (BREZTRI AEROSPHERE) 160-9-4.8 MCG/ACT AERO Take 2 puffs by mouth in the morning and at bedtime. Dispense 3 inhalers 12/06/21   [name], MD  Cholecalciferol (VITAMIN D3) 125 MCG (5000 UT) CAPS Take 5,000 Units by mouth daily.    [provider]  fluticasone (FLONASE) 50 MCG/ACT nasal spray Place 2 sprays into both nostrils  daily as needed for allergies. 11/24/20   [name], MD  furosemide (LASIX) 20 MG tablet Take 1 tablet (20 mg total) by mouth as needed (swelling). 02/02/22   Arnoldo Lenis, MD  metoprolol succinate (TOPROL XL) 25 MG 24 hr tablet Take 1 tablet (25 mg total) by mouth daily. 03/08/22   Arnoldo Lenis, MD  nitroGLYCERIN (NITROSTAT) 0.4 MG SL tablet Place 1 tablet (0.4 mg total) under the tongue every 5 (five) minutes as needed for chest pain. 11/24/20   [name], MD  pantoprazole (PROTONIX) 40 MG tablet Take 1 tablet (40 mg total) by mouth daily. 12/13/21   [name], MD  spironolactone (ALDACTONE) 25 MG tablet Take 0.5 tablets (12.5 mg total) by mouth daily. 03/08/22   Arnoldo Lenis, MD  warfarin (COUMADIN) 5 MG tablet Take 1 tablet (5 mg total) by mouth daily. Patient taking differently: Take 2.5-5 mg by mouth See admin instructions. 5 mg on Friday, 2.5 mg all other days 05/30/21   [name], MD      Allergies    Patient has no known allergies.    Review of Systems   Review of Systems  Cardiovascular:  Positive for chest pain.  All other systems reviewed and are negative.   Physical Exam Updated Vital Signs BP (!) 144/89   Pulse 80   Temp 98.2 F (36.8 C) (Oral)   Resp 19   Ht '5\' 6"'$  (1.676 m)  Wt 67.6 kg   SpO2 98%   BMI 24.05 kg/m  Physical Exam Vitals and nursing note reviewed.  Constitutional:      General: He is not in acute distress.    Appearance: Normal appearance.  HENT:     Head: Normocephalic and atraumatic.  Eyes:     General:        Right eye: No discharge.        Left eye: No discharge.  Cardiovascular:     Rate and Rhythm: Normal rate. Rhythm regularly irregular.     Pulses:          Radial pulses are 2+ on the right side and 2+ on the left side.       Dorsalis pedis pulses are 2+ on the right side and 2+ on the left side.     Heart sounds: Normal heart sounds.  Pulmonary:     Comments: Clear to auscultation bilaterally.  Normal  effort.  No respiratory distress.  No evidence of wheezes, rales, or rhonchi heard throughout. Abdominal:     General: Abdomen is flat. Bowel sounds are normal. There is no distension.     Tenderness: There is no abdominal tenderness. There is no guarding or rebound.  Musculoskeletal:        General: Normal range of motion.     Cervical back: Neck supple.     Right lower leg: No edema.     Left lower leg: No edema.  Skin:    General: Skin is warm and dry.     Findings: No rash.  Neurological:     General: No focal deficit present.     Mental Status: He is alert.  Psychiatric:        Mood and Affect: Mood normal.        Behavior: Behavior normal.     ED Results / Procedures / Treatments   Labs (all labs ordered are listed, but only abnormal results are displayed) Labs Reviewed  CBC WITH DIFFERENTIAL/PLATELET - Abnormal; Notable for the following components:      Result Value   RBC 3.82 (*)    HCT 37.0 (*)    MCH 34.3 (*)    All other components within normal limits  COMPREHENSIVE METABOLIC PANEL - Abnormal; Notable for the following components:   Potassium 3.1 (*)    Glucose, Bld 129 (*)    BUN 7 (*)    All other components within normal limits  PROTIME-INR - Abnormal; Notable for the following components:   Prothrombin Time 28.3 (*)    INR 2.7 (*)    All other components within normal limits  MAGNESIUM - Abnormal; Notable for the following components:   Magnesium 1.1 (*)    All other components within normal limits  TROPONIN I (HIGH SENSITIVITY) - Abnormal; Notable for the following components:   Troponin I (High Sensitivity) 21 (*)    All other components within normal limits  TROPONIN I (HIGH SENSITIVITY) - Abnormal; Notable for the following components:   Troponin I (High Sensitivity) 20 (*)    All other components within normal limits    EKG EKG Interpretation  Date/Time:  Saturday March 25 2022 19:02:41 EDT Ventricular Rate:  93 PR Interval:    QRS  Duration: 117 QT Interval:  402 QTC Calculation: 498 R Axis:   -10 Text Interpretation: Atrial fibrillation Ventricular premature complex Nonspecific intraventricular conduction delay Minimal ST depression, anterolateral leads Confirmed by Milton Ferguson [id number]) on 03/25/2022 8:27:07 PM  Radiology DG Chest Port 1 View  Result Date: 03/25/2022 CLINICAL DATA:  Sternal chest pain and left-sided chest pain EXAM: PORTABLE CHEST 1 VIEW COMPARISON:  01/17/2017 FINDINGS: Status post median sternotomy, internal mammary artery dissection, and CABG. No evidence of sternal wire fracture on this single view. Cardiac and mediastinal contours are within normal limits given AP technique. Hyperinflated lungs. No focal pulmonary opacity. No pleural effusion or pneumothorax. No acute osseous abnormality. IMPRESSION: No acute cardiopulmonary process. Electronically Signed   By: Merilyn Baba M.D.   On: 03/25/2022 19:29    Procedures Procedures    Medications Ordered in ED Medications  alum & mag hydroxide-simeth (MAALOX/MYLANTA) 200-200-20 MG/5ML suspension 30 mL (30 mLs Oral Given 03/25/22 1941)  potassium chloride (KLOR-CON) packet 60 mEq (60 mEq Oral Given 03/25/22 2029)  magnesium sulfate IVPB 2 g 50 mL (0 g Intravenous Stopped 03/25/22 2131)  nitroGLYCERIN (NITROSTAT) SL tablet 0.4 mg (0.4 mg Sublingual Given 03/25/22 2111)    ED Course/ Medical Decision Making/ A&P Clinical Course as of 03/25/22 2256  Sat Mar 25, 2022  2234 CBC with Differential(!) No evidence of leukocytosis.  There is evidence of erythrocytopenia.  [CF]  2235 Troponin I (High Sensitivity)(!) Initial and delta troponin are elevated. [CF]  2237 I spoke with Dr. Burt Knack with cardiology who agrees for admission over at [facility] and Cardiology will see him in consult.  [CF]  2238 Comprehensive metabolic panel(!) Mild hypokalemia. [CF]  2238 Protime-INR(!) PT/INR is elevated in the setting of Coumadin.  Seems to be therapeutic levels. [CF]   2252 I spoke with Dr. Darrell Jewel with [facility] hospitalist who agrees to admit the patient.  [CF]    Clinical Course User Index [CF] Hendricks Limes, PA-C                           Medical Decision Making TRAJON ROSETE is a 77 y.o. male patient who presents to the emergency department with left-sided chest pain.  Patient found to be in atrial fibrillation and alternating between A-fib and atrial flutter.  I will get cardiac labs in addition to some electrolytes to further evaluate.  Patient does have a history of paroxysmal atrial fibrillation but has not had atrial fibrillation in some time.  He takes Coumadin daily.  Has not missed any doses.  Currently he is rate controlled.  I will plan to reassess frequently.  After multiple reassessments, patient's chest pain is better after nitroglycerin but still complaining of pain.  Patient is a high risk chest pain patient.  I will speak with cardiology as I do feel the patient would likely benefit from admission and see with their consult.  I will also speak with hospitalist service to get her admitted likely over to [facility] for further management.  Amount and/or Complexity of Data Reviewed External Data Reviewed: labs, radiology and notes.    Details: Highlighted in HPI Labs: ordered. Decision-making details documented in ED Course. Radiology: ordered. ECG/medicine tests: ordered.  Risk OTC drugs. Prescription drug management. Decision regarding hospitalization.    Final Clinical Impression(s) / ED Diagnoses Final diagnoses:  Chest pain, unspecified type  Hypokalemia    Rx / DC Orders ED Discharge Orders     None         Hendricks Limes, PA-C 03/25/22 2256    Myna Bright [place], PA-C 03/25/22 2256    Milton Ferguson, MD 03/27/22 1454

## 2022-03-25 NOTE — Assessment & Plan Note (Signed)
-   With history of CABG -Continue statin, beta-blocker -Cardiology will see patient when he arrives to [facility]

## 2022-03-25 NOTE — H&P (Signed)
History and Physical    Patient: [name] YIF:[id number] DOB: 03/01/1946 DOA: 03/25/2022 DOS: the patient was seen and examined on 03/25/2022 PCP: [name], MD  Patient coming from: Home  Chief Complaint:  Chief Complaint  Patient presents with   Chest Pain   HPI: [name] is a 76 y.o. male with medical history significant of atrial fibrillation-on warfarin, coronary artery disease, COPD, GERD, alcohol use, hypertension, current smoker, and more presents the ED with a chief complaint of chest pain.  Patient reports the chest pain started last night.  He was on the bed when the pain started.  He could not sleep most of the night.  Positional changes helped the pain, like changing from laying on his left side to laying flat on his back.  Patient describes the chest pain as pins-and-needles in his chest.  At first it was substernal and then it moved to the left side.  It does feel like a mild pressure as well.  He endorses shortness of breath, but not much worse than baseline.  Patient reports he walked to the mailbox and back today and had significant palpitations and had to sit down.  He felt more short of breath at that time.  He was not lightheaded, did not have diaphoresis, did not have any nausea or vomiting.  Last night the pain eventually eased off in the early morning and then came back this afternoon.  Patient has nitro at home but did not try it.  Chest pain is reproducible with palpation.    Patient is a current smoker, half a pack per day.  He drinks 2-3 beers per day and has never had withdrawals.  Does not use any illicit drugs.  He is vaccinated for COVID.  Patient is DNR.  Patient was last seen by his cardiologist at the beginning of August.  He had lower extremity edema, shortness of breath, and an abnormal echo.  His echo in 2021 had revealed 55% ejection fraction, and his echo in July 2023 revealed 40-45% ejection fraction, RV dysfunction, and mild to moderate MR.  Patient is not on an ACE due to hyperkalemia.  He had recent med changes with Toprol being started, Norvasc being discontinued, Aldactone being added.  Ischemic testing was deferred at that time due to mild symptoms, but now with chest pain may be warranted.  Review of Systems: As mentioned in the history of present illness. All other systems reviewed and are negative. Past Medical History:  Diagnosis Date   Anginal pain ([place])    Atrial fibrillation by electrocardiogram ([place]) 11/2019   Atrial flutter by electrocardiogram [facility]) 11/2019   Bladder cancer ([place]) 01/2020   Bladder tumor    BPH (benign prostatic hyperplasia)    CAD (coronary artery disease)    a. s/p INF MI 1997;  b. s/p CABG 2001;  c. [place] 11/2009:  3v CAD, S-PDA ok with 40% mid, S-OM ok, S-Dx ok, L-LAD ok, EF 50%;  d.  [place] [facility] 5/14:  Inferolateral scar, EF 46%, no ischemia   COPD (chronic obstructive pulmonary disease) (HCC)    Dyspnea    ETOH abuse    GERD (gastroesophageal reflux disease)    History of gout    Hypercholesterolemia    Hypertension    Myocardial infarction [facility]) 2013   Prostate cancer ([place])    Tobacco abuse    Past Surgical History:  Procedure Laterality Date   ANTERIOR CERVICAL DECOMPRESSION/DISCECTOMY FUSION 4 LEVELS N/A 07/21/2016  Procedure: ANTERIOR CERVICAL DECOMPRESSION/DISCECTOMY FUSION CERVICAL TWO-THREE, CERVICAL THREE-FOUR,. CERVICAL FOUR-FIVE, CERVICAL  FIVE-SIX;  Surgeon: Ashok Pall, MD;  Location: [place];  Service: Neurosurgery;  Laterality: N/A;   BACK SURGERY     CARDIAC CATHETERIZATION  2011   CARDIOVERSION N/A 06/10/2020   Procedure: CARDIOVERSION;  Surgeon: Arnoldo Lenis, MD;  [facility];  Service: Endoscopy;  Laterality: N/A;   COLONOSCOPY     in remote past, Dr. Sharlett Iles. Obtaining records.    COLONOSCOPY N/A 11/21/2017   Surgeon: Daneil Dolin, MD; Three 4-6 mm polyps in the rectum and splenic flexure resected and retrieved (tubular adenomas), nonbleeding  internal hemorrhoids.  Repeat in 5 years.   COLONOSCOPY WITH PROPOFOL N/A 06/09/2021   Surgeon: Daneil Dolin, MD;  Three 3-7 mm polyps in the ascending colon resected and retrieved, mild radiation proctitis, otherwise normal exam. Pathology revealed tubular adenomas.  Recommended repeat colonoscopy in 5 years.   CORONARY ANGIOPLASTY     CORONARY ARTERY BYPASS GRAFT     ESOPHAGOGASTRODUODENOSCOPY  05/27/2012   LEX:NTZGYF esophagus, stomach and duodenum s/p dilator   POLYPECTOMY  11/21/2017   Procedure: POLYPECTOMY;  Surgeon: Daneil Dolin, MD;  [facility];  Service: Endoscopy;;  colon   POLYPECTOMY  06/09/2021   Procedure: POLYPECTOMY;  Surgeon: Daneil Dolin, MD;  [facility];  Service: Endoscopy;;   PROSTATE SURGERY     TRANSURETHRAL RESECTION OF BLADDER TUMOR WITH MITOMYCIN-C Left 01/06/2020   Procedure: CYSTOSCOPY TRANSURETHRAL RESECTION OF BLADDER TUMOR,LEFT URETERSCOPY AND GEMCITABINE;  Surgeon: Irine Seal, MD;  Location: [facility];  Service: Urology;  Laterality: Left;   Social History:  reports that he has been smoking cigarettes and e-cigarettes. He started smoking about 52 years ago. He has a 29.00 pack-year smoking history. He quit smokeless tobacco use about 10 years ago.  His smokeless tobacco use included snuff. He reports current alcohol use of about 2.0 - 4.0 standard drinks of alcohol per week. He reports that he does not use drugs.  No Known Allergies  Family History  Problem Relation Age of Onset   Diabetes Mother    Hypertension Father    Heart disease Brother    Diabetes Brother    Heart disease Brother    Diabetes Brother    Multiple sclerosis Daughter    Healthy Son    Colon cancer Neg Hx    Inflammatory bowel disease Neg Hx     Prior to Admission medications   Medication Sig Start Date End Date Taking? Authorizing Provider  acetaminophen (TYLENOL) 500 MG tablet Take 2 tablets (1,000 mg total) by mouth every 6 (six) hours as  needed for moderate pain or headache. 03/03/20   [name], MD  albuterol (VENTOLIN HFA) 108 (90 Base) MCG/ACT inhaler Inhale 2 puffs into the lungs every 6 (six) hours as needed for wheezing or shortness of breath. 11/28/21   [name], MD  allopurinol (ZYLOPRIM) 100 MG tablet Take 1 tablet (100 mg total) by mouth daily. 05/30/21   [name], MD  atorvastatin (LIPITOR) 80 MG tablet TAKE 1 TABLET EVERY DAY AT 6 PM 05/30/21   [name], MD  B Complex Vitamins (VITAMIN B COMPLEX PO) Take 1 tablet by mouth daily.    [provider]  Budeson-Glycopyrrol-Formoterol (BREZTRI AEROSPHERE) 160-9-4.8 MCG/ACT AERO Take 2 puffs by mouth in the morning and at bedtime. Dispense 3 inhalers 12/06/21   [name], MD  Cholecalciferol (VITAMIN D3) 125 MCG (5000 UT) CAPS  Take 5,000 Units by mouth daily.    [provider]  fluticasone (FLONASE) 50 MCG/ACT nasal spray Place 2 sprays into both nostrils daily as needed for allergies. 11/24/20   [name], MD  furosemide (LASIX) 20 MG tablet Take 1 tablet (20 mg total) by mouth as needed (swelling). 02/02/22   Arnoldo Lenis, MD  metoprolol succinate (TOPROL XL) 25 MG 24 hr tablet Take 1 tablet (25 mg total) by mouth daily. 03/08/22   Arnoldo Lenis, MD  nitroGLYCERIN (NITROSTAT) 0.4 MG SL tablet Place 1 tablet (0.4 mg total) under the tongue every 5 (five) minutes as needed for chest pain. 11/24/20   [name], MD  pantoprazole (PROTONIX) 40 MG tablet Take 1 tablet (40 mg total) by mouth daily. 12/13/21   [name], MD  spironolactone (ALDACTONE) 25 MG tablet Take 0.5 tablets (12.5 mg total) by mouth daily. 03/08/22   Arnoldo Lenis, MD  warfarin (COUMADIN) 5 MG tablet Take 1 tablet (5 mg total) by mouth daily. Patient taking differently: Take 2.5-5 mg by mouth See admin instructions. 5 mg on Friday, 2.5 mg all other days 05/30/21   [name], MD    Physical Exam: Vitals:   03/25/22 2130 03/25/22 2200  03/25/22 2230 03/25/22 2248  BP: 137/88 (!) 133/91 (!) 144/89   Pulse: 79 73 80   Resp: '19 20 19   '$ Temp:    98.2 F (36.8 C)  TempSrc:    Oral  SpO2: 97% 97% 98%   Weight:      Height:       1.  General: Patient lying supine in bed,  no acute distress   2. Psychiatric: Alert and oriented x 3, mood and behavior normal for situation, pleasant and cooperative with exam   3. Neurologic: Speech and language are normal, face is symmetric, moves all 4 extremities voluntarily, at baseline without acute deficits on limited exam   4. HEENMT:  Head is atraumatic, normocephalic, pupils reactive to light, neck is supple, trachea is midline, mucous membranes are moist   5. Respiratory : Lungs are clear to auscultation bilaterally without wheezing, rhonchi, rales, no cyanosis, no increase in work of breathing or accessory muscle use   6. Cardiovascular : Heart rate normal, rhythm is irregular, no murmurs, rubs or gallops, no peripheral edema, peripheral pulses palpated   7. Gastrointestinal:  Abdomen is soft, nondistended, nontender to palpation bowel sounds active, no masses or organomegaly palpated   8. Skin:  Skin is warm, dry and intact without rashes, acute lesions, or ulcers on limited exam   9.Musculoskeletal:  No acute deformities or trauma, no asymmetry in tone, no peripheral edema, peripheral pulses palpated, no tenderness to palpation in the extremities  Data Reviewed: In the ED Temp 98.9, heart rate 73-95, respiratory 17-23, blood pressure 130/88-160/104 maintaining oxygen sats on room air No leukocytosis, hemoglobin 13.1, platelets 193 Hypokalemia at 3.1 slightly elevated bicarb at 31 likely related to his COPD Troponin downtrending 21, 20 INR therapeutic 2.7 Chest x-ray which shows no acute cardiopulmonary process EKG shows a heart rate of 93, A-fib, QTc 498 Cardiology was consulted and recommends admission to [facility] for possible nonemergent cath  Assessment and  Plan: Hypokalemia - 3.1 on chemistry -60 mEq replaced in the ED -Mag sulfate also given in the ED -Trending both on morning labs  Chest pain - Chest pain with typical and atypical features -Worsening symptoms with exertion, better with changes in position -Did not try nitro at  home -Does not know if nitro helped when he was in the ER -Currently pain-free, but pain is reproducible with palpation -Troponins downtrending 21, 20 -Cycle 1 more troponin with morning labs -Lipid panel -Cardiology to see when patient arrives at [facility] -Monitor on telemetry  COPD (chronic obstructive pulmonary disease) (HCC) - Continue albuterol as needed -Continue budesonide-glycopyrrolate-formoterol inhaler -Counseled on smoking cessation  Essential hypertension - Continue Toprol, spironolactone  CAD (coronary artery disease) - With history of CABG -Continue statin, beta-blocker -Cardiology will see patient when he arrives to [facility]  GERD (gastroesophageal reflux disease) - Continue Protonix   TOBACCO USER - Advised on the importance of cessation -Smokes half a pack per day -Nicotine patch ordered  Hyperlipidemia - Continue statin -Lipid panel in the a.m.      Advance Care Planning:   Code Status: DNR   Consults: Cardiology  Family Communication: No family at bedside  Severity of Illness: The appropriate patient status for this patient is OBSERVATION. Observation status is judged to be reasonable and necessary in order to provide the required intensity of service to ensure the patient's safety. The patient's presenting symptoms, physical exam findings, and initial radiographic and laboratory data in the context of their medical condition is felt to place them at decreased risk for further clinical deterioration. Furthermore, it is anticipated that the patient will be medically stable for discharge from the hospital within 2 midnights of admission.   Author: Rolla Plate, DO 03/25/2022 11:36 PM  For on call review [email or web address].

## 2022-03-25 NOTE — Assessment & Plan Note (Signed)
Advised on the importance of cessation - Smokes half a pack per day - Nicotine patch ordered 

## 2022-03-26 ENCOUNTER — Other Ambulatory Visit: Payer: Self-pay

## 2022-03-26 DIAGNOSIS — I200 Unstable angina: Secondary | ICD-10-CM | POA: Diagnosis not present

## 2022-03-26 DIAGNOSIS — K219 Gastro-esophageal reflux disease without esophagitis: Secondary | ICD-10-CM | POA: Diagnosis present

## 2022-03-26 DIAGNOSIS — Z8546 Personal history of malignant neoplasm of prostate: Secondary | ICD-10-CM | POA: Diagnosis not present

## 2022-03-26 DIAGNOSIS — I25710 Atherosclerosis of autologous vein coronary artery bypass graft(s) with unstable angina pectoris: Secondary | ICD-10-CM | POA: Diagnosis present

## 2022-03-26 DIAGNOSIS — I272 Pulmonary hypertension, unspecified: Secondary | ICD-10-CM | POA: Diagnosis present

## 2022-03-26 DIAGNOSIS — E7800 Pure hypercholesterolemia, unspecified: Secondary | ICD-10-CM | POA: Diagnosis present

## 2022-03-26 DIAGNOSIS — E875 Hyperkalemia: Secondary | ICD-10-CM | POA: Diagnosis present

## 2022-03-26 DIAGNOSIS — Z7901 Long term (current) use of anticoagulants: Secondary | ICD-10-CM | POA: Diagnosis not present

## 2022-03-26 DIAGNOSIS — I482 Chronic atrial fibrillation, unspecified: Secondary | ICD-10-CM | POA: Diagnosis not present

## 2022-03-26 DIAGNOSIS — Z8249 Family history of ischemic heart disease and other diseases of the circulatory system: Secondary | ICD-10-CM | POA: Diagnosis not present

## 2022-03-26 DIAGNOSIS — I2582 Chronic total occlusion of coronary artery: Secondary | ICD-10-CM | POA: Diagnosis present

## 2022-03-26 DIAGNOSIS — I4891 Unspecified atrial fibrillation: Secondary | ICD-10-CM | POA: Diagnosis not present

## 2022-03-26 DIAGNOSIS — Z833 Family history of diabetes mellitus: Secondary | ICD-10-CM | POA: Diagnosis not present

## 2022-03-26 DIAGNOSIS — Z66 Do not resuscitate: Secondary | ICD-10-CM | POA: Diagnosis present

## 2022-03-26 DIAGNOSIS — E782 Mixed hyperlipidemia: Secondary | ICD-10-CM | POA: Diagnosis not present

## 2022-03-26 DIAGNOSIS — I25110 Atherosclerotic heart disease of native coronary artery with unstable angina pectoris: Secondary | ICD-10-CM | POA: Diagnosis present

## 2022-03-26 DIAGNOSIS — M109 Gout, unspecified: Secondary | ICD-10-CM | POA: Diagnosis present

## 2022-03-26 DIAGNOSIS — E876 Hypokalemia: Secondary | ICD-10-CM | POA: Diagnosis present

## 2022-03-26 DIAGNOSIS — I110 Hypertensive heart disease with heart failure: Secondary | ICD-10-CM | POA: Diagnosis present

## 2022-03-26 DIAGNOSIS — J449 Chronic obstructive pulmonary disease, unspecified: Secondary | ICD-10-CM | POA: Diagnosis present

## 2022-03-26 DIAGNOSIS — Z981 Arthrodesis status: Secondary | ICD-10-CM | POA: Diagnosis not present

## 2022-03-26 DIAGNOSIS — I252 Old myocardial infarction: Secondary | ICD-10-CM | POA: Diagnosis not present

## 2022-03-26 DIAGNOSIS — E785 Hyperlipidemia, unspecified: Secondary | ICD-10-CM | POA: Diagnosis not present

## 2022-03-26 DIAGNOSIS — Z8551 Personal history of malignant neoplasm of bladder: Secondary | ICD-10-CM | POA: Diagnosis not present

## 2022-03-26 DIAGNOSIS — I5022 Chronic systolic (congestive) heart failure: Secondary | ICD-10-CM | POA: Diagnosis present

## 2022-03-26 DIAGNOSIS — F17210 Nicotine dependence, cigarettes, uncomplicated: Secondary | ICD-10-CM | POA: Diagnosis present

## 2022-03-26 DIAGNOSIS — Z79899 Other long term (current) drug therapy: Secondary | ICD-10-CM | POA: Diagnosis not present

## 2022-03-26 DIAGNOSIS — I10 Essential (primary) hypertension: Secondary | ICD-10-CM | POA: Diagnosis not present

## 2022-03-26 DIAGNOSIS — I4819 Other persistent atrial fibrillation: Secondary | ICD-10-CM | POA: Diagnosis present

## 2022-03-26 DIAGNOSIS — R079 Chest pain, unspecified: Secondary | ICD-10-CM | POA: Diagnosis present

## 2022-03-26 DIAGNOSIS — I25118 Atherosclerotic heart disease of native coronary artery with other forms of angina pectoris: Secondary | ICD-10-CM | POA: Diagnosis not present

## 2022-03-26 DIAGNOSIS — I4811 Longstanding persistent atrial fibrillation: Secondary | ICD-10-CM | POA: Diagnosis not present

## 2022-03-26 LAB — CBC
HCT: 33.8 % — ABNORMAL LOW (ref 39.0–52.0)
Hemoglobin: 11.7 g/dL — ABNORMAL LOW (ref 13.0–17.0)
MCH: 34 pg (ref 26.0–34.0)
MCHC: 34.6 g/dL (ref 30.0–36.0)
MCV: 98.3 fL (ref 80.0–100.0)
Platelets: 175 10*3/uL (ref 150–400)
RBC: 3.44 MIL/uL — ABNORMAL LOW (ref 4.22–5.81)
RDW: 13.9 % (ref 11.5–15.5)
WBC: 6.2 10*3/uL (ref 4.0–10.5)
nRBC: 0 % (ref 0.0–0.2)

## 2022-03-26 LAB — COMPREHENSIVE METABOLIC PANEL
ALT: 22 U/L (ref 0–44)
AST: 29 U/L (ref 15–41)
Albumin: 3.3 g/dL — ABNORMAL LOW (ref 3.5–5.0)
Alkaline Phosphatase: 92 U/L (ref 38–126)
Anion gap: 7 (ref 5–15)
BUN: 8 mg/dL (ref 8–23)
CO2: 28 mmol/L (ref 22–32)
Calcium: 8.8 mg/dL — ABNORMAL LOW (ref 8.9–10.3)
Chloride: 104 mmol/L (ref 98–111)
Creatinine, Ser: 0.95 mg/dL (ref 0.61–1.24)
GFR, Estimated: >60 mL/min (ref >60)
Glucose, Bld: 100 mg/dL — ABNORMAL HIGH (ref 70–99)
Potassium: 3.5 mmol/L (ref 3.5–5.1)
Sodium: 139 mmol/L (ref 135–145)
Total Bilirubin: 1.1 mg/dL (ref 0.3–1.2)
Total Protein: 6.5 g/dL (ref 6.5–8.1)

## 2022-03-26 LAB — TROPONIN I (HIGH SENSITIVITY): Troponin I (High Sensitivity): 17 ng/L (ref <18)

## 2022-03-26 LAB — LIPID PANEL
Cholesterol: 116 mg/dL (ref 0–200)
HDL: 43 mg/dL (ref >40)
LDL Cholesterol: 51 mg/dL (ref 0–99)
Total CHOL/HDL Ratio: 2.7 RATIO
Triglycerides: 108 mg/dL (ref <150)
VLDL: 22 mg/dL (ref 0–40)

## 2022-03-26 LAB — PROTIME-INR
INR: 2.3 — ABNORMAL HIGH (ref 0.8–1.2)
Prothrombin Time: 24.9 seconds — ABNORMAL HIGH (ref 11.4–15.2)

## 2022-03-26 LAB — MAGNESIUM: Magnesium: 1.7 mg/dL (ref 1.7–2.4)

## 2022-03-26 MED ORDER — WARFARIN - PHYSICIAN DOSING INPATIENT: Freq: Every day | Status: DC

## 2022-03-26 MED ORDER — SODIUM CHLORIDE 0.9 % IV SOLN: INTRAVENOUS | Status: DC

## 2022-03-26 MED ORDER — POTASSIUM CHLORIDE CRYS ER 20 MEQ PO TBCR
40.0000 meq | EXTENDED_RELEASE_TABLET | Freq: Once | ORAL | Status: AC
  Administered 2022-03-26: 40 meq via ORAL
  Filled 2022-03-26: qty 2

## 2022-03-26 MED ORDER — UMECLIDINIUM BROMIDE 62.5 MCG/ACT IN AEPB
1.0000 | INHALATION_SPRAY | Freq: Every day | RESPIRATORY_TRACT | Status: DC
  Administered 2022-03-26 – 2022-03-27 (×2): 1 via RESPIRATORY_TRACT
  Filled 2022-03-26: qty 7

## 2022-03-26 MED ORDER — ORAL CARE MOUTH RINSE
15.0000 mL | OROMUCOSAL | Status: DC | PRN
Start: 2022-03-26 — End: 2022-03-28

## 2022-03-26 MED ORDER — SPIRONOLACTONE 12.5 MG HALF TABLET
12.5000 mg | ORAL_TABLET | Freq: Every day | ORAL | Status: DC
  Administered 2022-03-27 – 2022-03-28 (×2): 12.5 mg via ORAL
  Filled 2022-03-26 (×2): qty 1

## 2022-03-26 MED ORDER — ALBUTEROL SULFATE (2.5 MG/3ML) 0.083% IN NEBU: 3.0000 mL | INHALATION_SOLUTION | Freq: Four times a day (QID) | RESPIRATORY_TRACT | Status: DC | PRN

## 2022-03-26 MED ORDER — WARFARIN SODIUM 5 MG PO TABS: 5.0000 mg | ORAL_TABLET | ORAL | Status: DC

## 2022-03-26 MED ORDER — CHLORHEXIDINE GLUCONATE CLOTH 2 % EX PADS
6.0000 | MEDICATED_PAD | Freq: Every day | CUTANEOUS | Status: DC
Start: 2022-03-26 — End: 2022-03-28
  Administered 2022-03-26 – 2022-03-27 (×2): 6 via TOPICAL

## 2022-03-26 MED ORDER — SODIUM CHLORIDE 0.9 % IV SOLN
INTRAVENOUS | Status: DC
  Administered 2022-03-26: 1000 mL via INTRAVENOUS

## 2022-03-26 MED ORDER — WARFARIN SODIUM 2.5 MG PO TABS
2.5000 mg | ORAL_TABLET | ORAL | Status: DC
  Administered 2022-03-26: 2.5 mg via ORAL
  Filled 2022-03-26: qty 1

## 2022-03-26 MED ORDER — MOMETASONE FURO-FORMOTEROL FUM 200-5 MCG/ACT IN AERO
2.0000 | INHALATION_SPRAY | Freq: Two times a day (BID) | RESPIRATORY_TRACT | Status: DC
  Administered 2022-03-26 – 2022-03-27 (×3): 2 via RESPIRATORY_TRACT
  Filled 2022-03-26: qty 8.8

## 2022-03-26 NOTE — Progress Notes (Signed)
PROGRESS NOTE    Patient: [name]                            PCP: [name], MD                    DOB: 1946/03/27            DOA: 03/25/2022 JSE:[id number]             DOS: 03/26/2022, 9:48 AM   LOS: 0 days   Date of Service: The patient was seen and examined on 03/26/2022  Subjective:   The patient was seen and examined this morning. Stable at this time. Still complaining of chest pain about 4/10, improved with pain medication Denies any shortness of breath   Discussed with cardiology Dr. Suzan Nailer transfer to [facility], for possible cardiac cath  Brief Narrative:   [name] is a 76 y.o. male with medical history significant of atrial fibrillation-on warfarin, coronary artery disease, COPD, GERD, alcohol use, hypertension, current smoker, and more presents the ED with a chief complaint of chest pain.   Patient reports the chest pain started last night.  He was on the bed when the pain started.  He could not sleep most of the night.  Positional changes helped the pain, like changing from laying on his left side to laying flat on his back.  Patient describes the chest pain as pins-and-needles in his chest.  At first it was substernal and then it moved to the left side.  It does feel like a mild pressure as well.  He endorses shortness of breath, but not much worse than baseline.  Patient reports he walked to the mailbox and back today and had significant palpitations and had to sit down.  He felt more short of breath at that time.   Patient has nitro at home but did not try it.  Chest pain is reproducible with palpation.     Patient is a current smoker, half a pack per day.  He drinks 2-3 beers per day and has never had withdrawals.  Does not use any illicit drugs.   He is vaccinated for COVID.   Patient is DNR.   Patient was last seen by his cardiologist at the beginning of August.  He had lower extremity edema, shortness of breath, and an abnormal echo.   Echo in 2021  had revealed EJF 55%  Echo in July 2023 revealed 40-45% ejection fraction, RV dysfunction, and mild to moderate MR.  Patient is not on an ACE due to hyperkalemia.  He had recent med changes with Toprol being started, Norvasc being discontinued, Aldactone being added.   Ischemic testing was deferred at that time due to mild symptoms, but now with chest pain may be warranted.   ED Temp 98.9, heart rate 73-95, respiratory 17-23, blood pressure 130/88-160/104 maintaining oxygen sats on room air No leukocytosis, hemoglobin 13.1, platelets 193 Hypokalemia at 3.1 slightly elevated bicarb at 31 likely related to his COPD Troponin downtrending 21, 20 INR therapeutic 2.7 Chest x-ray which shows no acute cardiopulmonary process EKG shows a heart rate of 93, A-fib, QTc 498 Cardiology was consulted and recommends admission to [facility] for possible nonemergent cath    Assessment & Plan:   Active Problems:   Hyperlipidemia   TOBACCO USER   GERD (gastroesophageal reflux disease)   CAD (coronary artery disease)   Essential hypertension   COPD (  chronic obstructive pulmonary disease) (HCC)   Atrial fibrillation (HCC)   Chest pain   Hypokalemia     Assessment and Plan: Hypokalemia - 3.1 on chemistry >>> 3.5  -60 mEq replaced in the ED -Mag sulfate also given in the ED -Trending   Chest pain - still having Chest pain 4/10 some relief home analgesics and nitroglycerin -Now has a headache from nitroglycerin -Per patient chest pain is worse with exertion  -Troponins downtrending 21, 20, 17 -Lipid panel >> LDL: 51 -On Coumadin, INR 2.3  -Cardiology to see when patient arrives at [facility] (Previous cardiology note stated patient will need a cath)   -Monitor on telemetry  Atrial fibrillation ([place]) - EKG in rate controlled A-fib - Continue metoprolol - Continue warfarin  COPD (chronic obstructive pulmonary disease) (HCC) - Continue albuterol as needed -Continue  budesonide-glycopyrrolate-formoterol inhaler -Counseled on smoking cessation  Essential hypertension - Continue Toprol, spironolactone  CAD (coronary artery disease) - With history of CABG -Continue statin, beta-blocker -Cardiology will see patient when he arrives to [facility]  GERD (gastroesophageal reflux disease) - Continue Protonix   TOBACCO USER - Advised on the importance of cessation -Smokes half a pack per day -Nicotine patch ordered  Hyperlipidemia - Continue statin -Lipid panel Lipid Panel     Component Value Date/Time   CHOL 116 03/26/2022 0514   CHOL 120 11/28/2021 1345   TRIG 108 03/26/2022 0514   HDL 43 03/26/2022 0514   HDL 49 11/28/2021 1345   CHOLHDL 2.7 03/26/2022 0514   VLDL 22 03/26/2022 0514   LDLCALC 51 03/26/2022 0514   LDLCALC 50 11/28/2021 1345   LABVLDL 21 11/28/2021 1345        ------------------------------------------------------------------------------------------------------------------------------------------------  DVT prophylaxis:  SCDs Start: 03/25/22 2334 warfarin (COUMADIN) tablet 2.5 mg  warfarin (COUMADIN) tablet 5 mg   Code Status:   Code Status: DNR  Family Communication: No family member present at bedside- attempt will be made to update daily The above findings and plan of care has been discussed with patient (and family)  in detail,  they expressed understanding and agreement of above. -Advance care planning has been discussed.   Admission status:   Status is: Observation The patient remains OBS appropriate and will d/c before 2 midnights.     Procedures:   No admission procedures for hospital encounter.   Antimicrobials:  Anti-infectives (From admission, onward)    None        Medication:   atorvastatin  80 mg Oral Daily   metoprolol succinate  25 mg Oral Daily   mometasone-formoterol  2 puff Inhalation BID   nicotine  14 mg Transdermal Daily   pantoprazole  40 mg Oral Daily   [START ON  03/27/2022] spironolactone  12.5 mg Oral Daily   umeclidinium bromide  1 puff Inhalation Daily   warfarin  2.5 mg Oral Once per day on Sun Tue Wed Thu Fri Sat   And   [START ON 03/27/2022] warfarin  5 mg Oral Q Mon   Warfarin - Physician Dosing Inpatient   Does not apply q1600    acetaminophen, nitroGLYCERIN, ondansetron (ZOFRAN) IV   Objective:   Vitals:   03/26/22 0400 03/26/22 0500 03/26/22 0720 03/26/22 0800  BP: (!) 147/97 (!) 156/94  (!) 149/89  Pulse:  84  87  Resp: '17 17  16  '$ Temp:   98.5 F (36.9 C) 98.5 F (36.9 C)  TempSrc:    Oral  SpO2:  96%  96%  Weight:  Height:        Intake/Output Summary (Last 24 hours) at 03/26/2022 0948 Last data filed at 03/26/2022 0813 Gross per 24 hour  Intake --  Output 250 ml  Net -250 ml   Filed Weights   03/25/22 1906  Weight: 67.6 kg     Examination:     Physical Exam:   General:  AAO x 3,  cooperative, no distress;   HEENT:  Normocephalic, PERRL, otherwise with in Normal limits   Neuro:  CNII-XII intact. , normal motor and sensation, reflexes intact   Lungs:   Clear to auscultation BL, Respirations unlabored,  No wheezes / crackles  Cardio:    S1/S2, RRR, No murmure, No Rubs or Gallops   Abdomen:  Soft, non-tender, bowel sounds active all four quadrants, no guarding or peritoneal signs.  Muscular  skeletal:  Limited exam -global generalized weaknesses - in bed, able to move all 4 extremities,   2+ pulses,  symmetric, No pitting edema  Skin:  Dry, warm to touch, negative for any Rashes,  Wounds: Please see nursing documentation         ------------------------------------------------------------------------------------------------------------------------------------------    LABs:     Latest Ref Rng & Units 03/26/2022    5:14 AM 03/25/2022    7:09 PM 11/28/2021    1:45 PM  CBC  WBC 4.0 - 10.5 K/uL 6.2  8.0  6.0   Hemoglobin 13.0 - 17.0 g/dL 11.7  13.1  12.7   Hematocrit 39.0 - 52.0 % 33.8  37.0   37.0   Platelets 150 - 400 K/uL 175  193  220       Latest Ref Rng & Units 03/26/2022    5:14 AM 03/25/2022    7:09 PM 11/28/2021    1:45 PM  CMP  Glucose 70 - 99 mg/dL 100  129  81   BUN 8 - 23 mg/dL '8  7  3   '$ Creatinine 0.61 - 1.24 mg/dL 0.95  1.08  0.85   Sodium 135 - 145 mmol/L 139  141  143   Potassium 3.5 - 5.1 mmol/L 3.5  3.1  3.8   Chloride 98 - 111 mmol/L 104  101  103   CO2 22 - 32 mmol/L '28  31  22   '$ Calcium 8.9 - 10.3 mg/dL 8.8  9.4  9.1   Total Protein 6.5 - 8.1 g/dL 6.5  7.7  6.4   Total Bilirubin 0.3 - 1.2 mg/dL 1.1  1.1  0.4   Alkaline Phos 38 - 126 U/L 92  107  120   AST 15 - 41 U/L 29  37  39   ALT 0 - 44 U/L '22  25  24        '$ Micro Results No results found for this or any previous visit (from the past 240 hour(s)).  Radiology Reports DG Chest Port 1 View  Result Date: 03/25/2022 CLINICAL DATA:  Sternal chest pain and left-sided chest pain EXAM: PORTABLE CHEST 1 VIEW COMPARISON:  01/17/2017 FINDINGS: Status post median sternotomy, internal mammary artery dissection, and CABG. No evidence of sternal wire fracture on this single view. Cardiac and mediastinal contours are within normal limits given AP technique. Hyperinflated lungs. No focal pulmonary opacity. No pleural effusion or pneumothorax. No acute osseous abnormality. IMPRESSION: No acute cardiopulmonary process. Electronically Signed   By: Merilyn Baba M.D.   On: 03/25/2022 19:29    SIGNED: Deatra James, MD, FHM. [facility],  Pager (please use [email or web address]  to page/text) Please use Epic Secure Chat for non-urgent communication (7AM-7PM)  If 7PM-7AM, please contact night-coverage [email or web address], 03/26/2022, 9:48 AM

## 2022-03-26 NOTE — Hospital Course (Addendum)
[name] is a 76 y.o. male with medical history significant of atrial fibrillation-on warfarin, coronary artery disease, COPD, GERD, alcohol use, hypertension, current smoker, and more presents the ED with a chief complaint of chest pain.   Patient reports the chest pain started last night.  He was on the bed when the pain started.  He could not sleep most of the night.  Positional changes helped the pain, like changing from laying on his left side to laying flat on his back.  Patient describes the chest pain as pins-and-needles in his chest.  At first it was substernal and then it moved to the left side.  It does feel like a mild pressure as well.  He endorses shortness of breath, but not much worse than baseline.  Patient reports he walked to the mailbox and back today and had significant palpitations and had to sit down.  He felt more short of breath at that time.   Patient has nitro at home but did not try it.  Chest pain is reproducible with palpation.     Patient is a current smoker, half a pack per day.  He drinks 2-3 beers per day and has never had withdrawals.  Does not use any illicit drugs.   He is vaccinated for COVID.   Patient is DNR.   Patient was last seen by his cardiologist at the beginning of August.  He had lower extremity edema, shortness of breath, and an abnormal echo.   Echo in 2021 had revealed EJF 55%  Echo in July 2023 revealed 40-45% ejection fraction, RV dysfunction, and mild to moderate MR.  Patient is not on an ACE due to hyperkalemia.  He had recent med changes with Toprol being started, Norvasc being discontinued, Aldactone being added.   Ischemic testing was deferred at that time due to mild symptoms, but now with chest pain may be warranted.   ED Temp 98.9, heart rate 73-95, respiratory 17-23, blood pressure 130/88-160/104 maintaining oxygen sats on room air No leukocytosis, hemoglobin 13.1, platelets 193 Hypokalemia at 3.1 slightly elevated bicarb at 31  likely related to his COPD Troponin downtrending 21, 20 INR therapeutic 2.7 Chest x-ray which shows no acute cardiopulmonary process EKG shows a heart rate of 93, A-fib, QTc 498 Cardiology was consulted and recommends admission to [facility] for possible nonemergent cath

## 2022-03-26 NOTE — ED Notes (Signed)
Spoke to the patient's wife. Update given.

## 2022-03-27 ENCOUNTER — Encounter (HOSPITAL_COMMUNITY)
Admission: EM | Disposition: A | Discharge status: Home / Self Care | Payer: Self-pay | Source: Home / Self Care | Attending: Family Medicine

## 2022-03-27 DIAGNOSIS — I482 Chronic atrial fibrillation, unspecified: Secondary | ICD-10-CM | POA: Diagnosis not present

## 2022-03-27 DIAGNOSIS — J449 Chronic obstructive pulmonary disease, unspecified: Secondary | ICD-10-CM | POA: Diagnosis not present

## 2022-03-27 DIAGNOSIS — I4891 Unspecified atrial fibrillation: Secondary | ICD-10-CM

## 2022-03-27 DIAGNOSIS — I25710 Atherosclerosis of autologous vein coronary artery bypass graft(s) with unstable angina pectoris: Secondary | ICD-10-CM

## 2022-03-27 DIAGNOSIS — I5022 Chronic systolic (congestive) heart failure: Secondary | ICD-10-CM

## 2022-03-27 DIAGNOSIS — I10 Essential (primary) hypertension: Secondary | ICD-10-CM | POA: Diagnosis not present

## 2022-03-27 DIAGNOSIS — I25118 Atherosclerotic heart disease of native coronary artery with other forms of angina pectoris: Secondary | ICD-10-CM | POA: Diagnosis not present

## 2022-03-27 DIAGNOSIS — R079 Chest pain, unspecified: Secondary | ICD-10-CM | POA: Diagnosis not present

## 2022-03-27 HISTORY — PX: CORONARY STENT INTERVENTION: CATH118234

## 2022-03-27 HISTORY — PX: RIGHT/LEFT HEART CATH AND CORONARY/GRAFT ANGIOGRAPHY: CATH118267

## 2022-03-27 LAB — POCT I-STAT EG7
Acid-Base Excess: 0 mmol/L (ref 0.0–2.0)
Acid-Base Excess: 1 mmol/L (ref 0.0–2.0)
Bicarbonate: 24.5 mmol/L (ref 20.0–28.0)
Bicarbonate: 26 mmol/L (ref 20.0–28.0)
Calcium, Ion: 1.15 mmol/L (ref 1.15–1.40)
Calcium, Ion: 1.17 mmol/L (ref 1.15–1.40)
HCT: 32 % — ABNORMAL LOW (ref 39.0–52.0)
HCT: 32 % — ABNORMAL LOW (ref 39.0–52.0)
Hemoglobin: 10.9 g/dL — ABNORMAL LOW (ref 13.0–17.0)
Hemoglobin: 10.9 g/dL — ABNORMAL LOW (ref 13.0–17.0)
O2 Saturation: 60 %
O2 Saturation: 61 %
Potassium: 4.5 mmol/L (ref 3.5–5.1)
Potassium: 4.5 mmol/L (ref 3.5–5.1)
Sodium: 140 mmol/L (ref 135–145)
Sodium: 141 mmol/L (ref 135–145)
TCO2: 26 mmol/L (ref 22–32)
TCO2: 27 mmol/L (ref 22–32)
pCO2, Ven: 38.3 mmHg — ABNORMAL LOW (ref 44–60)
pCO2, Ven: 40.1 mmHg — ABNORMAL LOW (ref 44–60)
pH, Ven: 7.415 (ref 7.25–7.43)
pH, Ven: 7.42 (ref 7.25–7.43)
pO2, Ven: 31 mmHg — CL (ref 32–45)
pO2, Ven: 31 mmHg — CL (ref 32–45)

## 2022-03-27 LAB — BASIC METABOLIC PANEL
Anion gap: 6 (ref 5–15)
BUN: 12 mg/dL (ref 8–23)
CO2: 26 mmol/L (ref 22–32)
Calcium: 8.3 mg/dL — ABNORMAL LOW (ref 8.9–10.3)
Chloride: 108 mmol/L (ref 98–111)
Creatinine, Ser: 0.92 mg/dL (ref 0.61–1.24)
GFR, Estimated: >60 mL/min (ref >60)
Glucose, Bld: 91 mg/dL (ref 70–99)
Potassium: 3.5 mmol/L (ref 3.5–5.1)
Sodium: 140 mmol/L (ref 135–145)

## 2022-03-27 LAB — POCT I-STAT 7, (LYTES, BLD GAS, ICA,H+H)
Acid-Base Excess: 0 mmol/L (ref 0.0–2.0)
Bicarbonate: 24.5 mmol/L (ref 20.0–28.0)
Calcium, Ion: 1.16 mmol/L (ref 1.15–1.40)
HCT: 30 % — ABNORMAL LOW (ref 39.0–52.0)
Hemoglobin: 10.2 g/dL — ABNORMAL LOW (ref 13.0–17.0)
O2 Saturation: 94 %
Potassium: 4.4 mmol/L (ref 3.5–5.1)
Sodium: 139 mmol/L (ref 135–145)
TCO2: 26 mmol/L (ref 22–32)
pCO2 arterial: 36.1 mmHg (ref 32–48)
pH, Arterial: 7.438 (ref 7.35–7.45)
pO2, Arterial: 68 mmHg — ABNORMAL LOW (ref 83–108)

## 2022-03-27 LAB — MRSA NEXT GEN BY PCR, NASAL: MRSA by PCR Next Gen: NOT DETECTED

## 2022-03-27 LAB — PROTIME-INR
INR: 1.4 — ABNORMAL HIGH (ref 0.8–1.2)
Prothrombin Time: 16.7 seconds — ABNORMAL HIGH (ref 11.4–15.2)

## 2022-03-27 LAB — POCT ACTIVATED CLOTTING TIME
Activated Clotting Time: 245 seconds
Activated Clotting Time: 239 seconds
Activated Clotting Time: 245 seconds
Activated Clotting Time: 227 seconds
Activated Clotting Time: 197 seconds

## 2022-03-27 SURGERY — RIGHT/LEFT HEART CATH AND CORONARY/GRAFT ANGIOGRAPHY
Anesthesia: LOCAL

## 2022-03-27 MED ORDER — LIDOCAINE HCL (PF) 1 % IJ SOLN
INTRAMUSCULAR | Status: AC
  Filled 2022-03-27: qty 30

## 2022-03-27 MED ORDER — LIDOCAINE HCL (PF) 1 % IJ SOLN
INTRAMUSCULAR | Status: DC | PRN
  Administered 2022-03-27 (×2): 5 mL via INTRADERMAL

## 2022-03-27 MED ORDER — MIDAZOLAM HCL 2 MG/2ML IJ SOLN
INTRAMUSCULAR | Status: DC | PRN
  Administered 2022-03-27: 1 mg via INTRAVENOUS

## 2022-03-27 MED ORDER — SODIUM CHLORIDE 0.9 % IV SOLN: 250.0000 mL | INTRAVENOUS | Status: DC | PRN

## 2022-03-27 MED ORDER — LOSARTAN POTASSIUM 25 MG PO TABS
25.0000 mg | ORAL_TABLET | Freq: Every day | ORAL | Status: DC
  Administered 2022-03-27 – 2022-03-28 (×2): 25 mg via ORAL
  Filled 2022-03-27 (×2): qty 1

## 2022-03-27 MED ORDER — CLOPIDOGREL BISULFATE 75 MG PO TABS
75.0000 mg | ORAL_TABLET | Freq: Every day | ORAL | Status: DC
  Administered 2022-03-28: 75 mg via ORAL
  Filled 2022-03-27: qty 1

## 2022-03-27 MED ORDER — HYDRALAZINE HCL 20 MG/ML IJ SOLN
10.0000 mg | INTRAMUSCULAR | Status: AC | PRN
Start: 2022-03-27 — End: 2022-03-27
  Administered 2022-03-27: 10 mg via INTRAVENOUS

## 2022-03-27 MED ORDER — HEPARIN SODIUM (PORCINE) 1000 UNIT/ML IJ SOLN
INTRAMUSCULAR | Status: DC | PRN
  Administered 2022-03-27: 6000 [IU] via INTRAVENOUS
  Administered 2022-03-27: 2000 [IU] via INTRAVENOUS

## 2022-03-27 MED ORDER — CLOPIDOGREL BISULFATE 300 MG PO TABS
ORAL_TABLET | ORAL | Status: DC | PRN
  Administered 2022-03-27: 600 mg via ORAL

## 2022-03-27 MED ORDER — METOPROLOL SUCCINATE ER 50 MG PO TB24
50.0000 mg | ORAL_TABLET | Freq: Every day | ORAL | Status: DC
  Administered 2022-03-28: 50 mg via ORAL
  Filled 2022-03-27: qty 1

## 2022-03-27 MED ORDER — HEPARIN SODIUM (PORCINE) 1000 UNIT/ML IJ SOLN
INTRAMUSCULAR | Status: AC
  Filled 2022-03-27: qty 10

## 2022-03-27 MED ORDER — FENTANYL CITRATE (PF) 100 MCG/2ML IJ SOLN
INTRAMUSCULAR | Status: DC | PRN
  Administered 2022-03-27: 25 ug via INTRAVENOUS

## 2022-03-27 MED ORDER — ACETAMINOPHEN 325 MG PO TABS
ORAL_TABLET | ORAL | Status: AC
Start: 2022-03-27 — End: ?
  Filled 2022-03-27: qty 2

## 2022-03-27 MED ORDER — IOHEXOL 350 MG/ML SOLN
INTRAVENOUS | Status: DC | PRN
  Administered 2022-03-27: 90 mL

## 2022-03-27 MED ORDER — MIDAZOLAM HCL 2 MG/2ML IJ SOLN
INTRAMUSCULAR | Status: AC
  Filled 2022-03-27: qty 2

## 2022-03-27 MED ORDER — HYDRALAZINE HCL 20 MG/ML IJ SOLN
INTRAMUSCULAR | Status: AC
  Filled 2022-03-27: qty 1

## 2022-03-27 MED ORDER — SODIUM CHLORIDE 0.9 % IV SOLN: INTRAVENOUS | Status: DC

## 2022-03-27 MED ORDER — SODIUM CHLORIDE 0.9 % WEIGHT BASED INFUSION
1.0000 mL/kg/h | INTRAVENOUS | Status: AC
  Administered 2022-03-27: 1 mL/kg/h via INTRAVENOUS

## 2022-03-27 MED ORDER — ASPIRIN 81 MG PO CHEW
81.0000 mg | CHEWABLE_TABLET | ORAL | Status: AC
  Administered 2022-03-27: 81 mg via ORAL
  Filled 2022-03-27: qty 1

## 2022-03-27 MED ORDER — HEPARIN (PORCINE) IN NACL 1000-0.9 UT/500ML-% IV SOLN
INTRAVENOUS | Status: DC | PRN
  Administered 2022-03-27 (×2): 500 mL

## 2022-03-27 MED ORDER — CLOPIDOGREL BISULFATE 300 MG PO TABS
ORAL_TABLET | ORAL | Status: AC
  Filled 2022-03-27: qty 2

## 2022-03-27 MED ORDER — NITROGLYCERIN 1 MG/10 ML FOR IR/CATH LAB
INTRA_ARTERIAL | Status: AC
Start: 2022-03-27 — End: ?
  Filled 2022-03-27: qty 10

## 2022-03-27 MED ORDER — HEPARIN (PORCINE) IN NACL 1000-0.9 UT/500ML-% IV SOLN
INTRAVENOUS | Status: AC
  Filled 2022-03-27: qty 1000

## 2022-03-27 MED ORDER — SODIUM CHLORIDE 0.9% FLUSH
3.0000 mL | Freq: Two times a day (BID) | INTRAVENOUS | Status: DC
  Administered 2022-03-28: 3 mL via INTRAVENOUS

## 2022-03-27 MED ORDER — FAMOTIDINE IN NACL 20-0.9 MG/50ML-% IV SOLN
INTRAVENOUS | Status: AC | PRN
  Administered 2022-03-27: 20 mg via INTRAVENOUS

## 2022-03-27 MED ORDER — SODIUM CHLORIDE 0.9 % IV SOLN
INTRAVENOUS | Status: AC | PRN
  Administered 2022-03-27: 10 mL/h via INTRAVENOUS

## 2022-03-27 MED ORDER — SODIUM CHLORIDE 0.9% FLUSH: 3.0000 mL | INTRAVENOUS | Status: DC | PRN

## 2022-03-27 MED ORDER — FAMOTIDINE IN NACL 20-0.9 MG/50ML-% IV SOLN
INTRAVENOUS | Status: AC
  Filled 2022-03-27: qty 50

## 2022-03-27 MED ORDER — FENTANYL CITRATE (PF) 100 MCG/2ML IJ SOLN
INTRAMUSCULAR | Status: AC
  Filled 2022-03-27: qty 2

## 2022-03-27 MED ORDER — ASPIRIN 81 MG PO CHEW
81.0000 mg | CHEWABLE_TABLET | Freq: Every day | ORAL | Status: DC
  Administered 2022-03-28: 81 mg via ORAL
  Filled 2022-03-27: qty 1

## 2022-03-27 SURGICAL SUPPLY — 18 items
CATH INFINITI 5 FR IM (CATHETERS) IMPLANT
CATH INFINITI 5FR MULTPACK ANG (CATHETERS) IMPLANT
CATH LAUNCHER 6FR AL1 (CATHETERS) IMPLANT
CATH SWAN GANZ 7F STRAIGHT (CATHETERS) IMPLANT
CATHETER LAUNCHER 6FR AL1 (CATHETERS) ×1
KIT ENCORE 26 ADVANTAGE (KITS) IMPLANT
KIT HEART LEFT (KITS) ×2 IMPLANT
PACK CARDIAC CATHETERIZATION (CUSTOM PROCEDURE TRAY) ×2 IMPLANT
SHEATH PINNACLE 5F 10CM (SHEATH) IMPLANT
SHEATH PINNACLE 6F 10CM (SHEATH) IMPLANT
SHEATH PINNACLE 7F 10CM (SHEATH) IMPLANT
SHEATH PROBE COVER 6X72 (BAG) IMPLANT
STENT SYNERGY XD 4.0X12 (Permanent Stent) IMPLANT
SYNERGY XD 4.0X12 (Permanent Stent) ×1 IMPLANT
TRANSDUCER W/STOPCOCK (MISCELLANEOUS) ×2 IMPLANT
TUBING CIL FLEX 10 FLL-RA (TUBING) ×2 IMPLANT
WIRE ASAHI PROWATER 180CM (WIRE) IMPLANT
WIRE EMERALD 3MM-J .035X150CM (WIRE) IMPLANT

## 2022-03-27 NOTE — H&P (View-Only) (Signed)
Cardiology Consultation:   [name] MRN: [id number]; DOB: 03/17/1946  Admit date: 03/25/2022 Date of Consult: 03/27/2022  PCP:  [name], MD   [facility] Cardiologist:  [name], MD        Patient Profile:   [name] is a 76 y.o. male with a hx of CAD who is being seen 03/27/2022 for the evaluation of chest pain at the request of [name].  History of Present Illness:   Mr. [name] is a 76 yo male patient with history of CAD, inf MI 1997, CABG 2001, patent grafts 2011, HTN, HLD, PAF on coumadin, COPD. Patient has new systolic CHF 04/9832 LVEF 82-50% being treated medically. He saw [name] 03/08/22 with some medication adjustments-stopped norvasc and started spironolactone, metoprolol changed to Toprol XL.  Patient admitted with chest pain troponins flat 21,20. EKG without acute change. Patient says the night his meds were changed he had trouble sleeping and chest pain-worse when he laid on his left side. Got short of breath walking to the mailbox with heart racing. Chest pain like pins and needles and tender to palpation yesterday. Says he's had some tightness since 5 am but didn't tell anyone. BP high now. Still smokes 1/2 ppd.   Past Medical History:  Diagnosis Date   Anginal pain ([place])    Atrial fibrillation by electrocardiogram ([place]) 11/2019   Atrial flutter by electrocardiogram [facility]) 11/2019   Bladder cancer ([place]) 01/2020   Bladder tumor    BPH (benign prostatic hyperplasia)    CAD (coronary artery disease)    a. s/p INF MI 1997;  b. s/p CABG 2001;  c. [place] 11/2009:  3v CAD, S-PDA ok with 40% mid, S-OM ok, S-Dx ok, L-LAD ok, EF 50%;  d.  [place] 5/14:  Inferolateral scar, EF 46%, no ischemia   COPD (chronic obstructive pulmonary disease) (HCC)    Dyspnea    ETOH abuse    GERD (gastroesophageal reflux disease)    History of gout    Hypercholesterolemia    Hypertension    Myocardial infarction [facility]) 2013   Prostate cancer  ([place])    Tobacco abuse     Past Surgical History:  Procedure Laterality Date   ANTERIOR CERVICAL DECOMPRESSION/DISCECTOMY FUSION 4 LEVELS N/A 07/21/2016   Procedure: ANTERIOR CERVICAL DECOMPRESSION/DISCECTOMY FUSION CERVICAL TWO-THREE, CERVICAL THREE-FOUR,. CERVICAL FOUR-FIVE, CERVICAL  FIVE-SIX;  Surgeon: Ashok Pall, MD;  Location: [facility];  Service: Neurosurgery;  Laterality: N/A;   BACK SURGERY     CARDIAC CATHETERIZATION  2011   CARDIOVERSION N/A 06/10/2020   Procedure: CARDIOVERSION;  Surgeon: Arnoldo Lenis, MD;  [facility];  Service: Endoscopy;  Laterality: N/A;   COLONOSCOPY     in remote past, Dr. Sharlett Iles. Obtaining records.    COLONOSCOPY N/A 11/21/2017   Surgeon: Daneil Dolin, MD; Three 4-6 mm polyps in the rectum and splenic flexure resected and retrieved (tubular adenomas), nonbleeding internal hemorrhoids.  Repeat in 5 years.   COLONOSCOPY WITH PROPOFOL N/A 06/09/2021   Surgeon: Daneil Dolin, MD;  Three 3-7 mm polyps in the ascending colon resected and retrieved, mild radiation proctitis, otherwise normal exam. Pathology revealed tubular adenomas.  Recommended repeat colonoscopy in 5 years.   CORONARY ANGIOPLASTY     CORONARY ARTERY BYPASS GRAFT     ESOPHAGOGASTRODUODENOSCOPY  05/27/2012   NLZ:JQBHAL esophagus, stomach and duodenum s/p dilator   POLYPECTOMY  11/21/2017   Procedure: POLYPECTOMY;  Surgeon: Daneil Dolin, MD;  [facility];  Service: Endoscopy;;  colon   POLYPECTOMY  06/09/2021   Procedure: POLYPECTOMY;  Surgeon: Daneil Dolin, MD;  [facility];  Service: Endoscopy;;   PROSTATE SURGERY     TRANSURETHRAL RESECTION OF BLADDER TUMOR WITH MITOMYCIN-C Left 01/06/2020   Procedure: CYSTOSCOPY TRANSURETHRAL RESECTION OF BLADDER TUMOR,LEFT URETERSCOPY AND GEMCITABINE;  Surgeon: Irine Seal, MD;  Location: [facility];  Service: Urology;  Laterality: Left;     Home Medications:  Prior to Admission medications    Medication Sig Start Date End Date Taking? Authorizing Provider  acetaminophen (TYLENOL) 500 MG tablet Take 2 tablets (1,000 mg total) by mouth every 6 (six) hours as needed for moderate pain or headache. 03/03/20  Yes [name], MD  albuterol (VENTOLIN HFA) 108 (90 Base) MCG/ACT inhaler Inhale 2 puffs into the lungs every 6 (six) hours as needed for wheezing or shortness of breath. 11/28/21  Yes [name], MD  allopurinol (ZYLOPRIM) 100 MG tablet Take 1 tablet (100 mg total) by mouth daily. 05/30/21  Yes [name], MD  atorvastatin (LIPITOR) 80 MG tablet TAKE 1 TABLET EVERY DAY AT 6 PM Patient taking differently: Take 80 mg by mouth daily at 6 PM. 05/30/21  Yes Stacks, Cletus Gash, MD  B Complex Vitamins (VITAMIN B COMPLEX PO) Take 1 tablet by mouth daily.   Yes [provider]  Budeson-Glycopyrrol-Formoterol (BREZTRI AEROSPHERE) 160-9-4.8 MCG/ACT AERO Take 2 puffs by mouth in the morning and at bedtime. Dispense 3 inhalers 12/06/21  Yes Stacks, Cletus Gash, MD  Cholecalciferol (VITAMIN D3) 125 MCG (5000 UT) CAPS Take 5,000 Units by mouth daily.   Yes [provider]  fluticasone (FLONASE) 50 MCG/ACT nasal spray Place 2 sprays into both nostrils daily as needed for allergies. 11/24/20  Yes [name], MD  furosemide (LASIX) 20 MG tablet Take 1 tablet (20 mg total) by mouth as needed (swelling). 02/02/22  Yes Abra Lingenfelter, Alphonse Guild, MD  metoprolol succinate (TOPROL XL) 25 MG 24 hr tablet Take 1 tablet (25 mg total) by mouth daily. 03/08/22  Yes Ayeza Therriault, Alphonse Guild, MD  nitroGLYCERIN (NITROSTAT) 0.4 MG SL tablet Place 1 tablet (0.4 mg total) under the tongue every 5 (five) minutes as needed for chest pain. 11/24/20  Yes Stacks, Cletus Gash, MD  pantoprazole (PROTONIX) 40 MG tablet Take 1 tablet (40 mg total) by mouth daily. 12/13/21  Yes [name], MD  spironolactone (ALDACTONE) 25 MG tablet Take 0.5 tablets (12.5 mg total) by mouth daily. 03/08/22  Yes BranchAlphonse Guild, MD  warfarin  (COUMADIN) 5 MG tablet Take 1 tablet (5 mg total) by mouth daily. Patient taking differently: Take 2.5-5 mg by mouth See admin instructions. 5 mg on Monday, 2.5 mg all other days 05/30/21  Yes [name], MD    Inpatient Medications: Scheduled Meds:  atorvastatin  80 mg Oral Daily   Chlorhexidine Gluconate Cloth  6 each Topical Daily   metoprolol succinate  25 mg Oral Daily   mometasone-formoterol  2 puff Inhalation BID   nicotine  14 mg Transdermal Daily   pantoprazole  40 mg Oral Daily   spironolactone  12.5 mg Oral Daily   umeclidinium bromide  1 puff Inhalation Daily   warfarin  2.5 mg Oral Once per day on Sun Tue Wed Thu Fri Sat   And   warfarin  5 mg Oral Q Mon   Warfarin - Physician Dosing Inpatient   Does not apply q1600   Continuous Infusions:  sodium chloride 50 mL/hr at 03/26/22  1817   PRN Meds: acetaminophen, albuterol, nitroGLYCERIN, ondansetron (ZOFRAN) IV, mouth rinse  Allergies:   No Known Allergies  Social History:   Social History   Socioeconomic History   Marital status: Married    Spouse name: Not on file   Number of children: 2   Years of education: Not on file   Highest education level: Not on file  Occupational History    Comment: retired  Tobacco Use   Smoking status: Every Day    Packs/day: 0.50    Years: 58.00    Total pack years: 29.00    Types: Cigarettes, E-cigarettes    Start date: 03/08/1970   Smokeless tobacco: Former    Types: Snuff    Quit date: 03/08/2012   Tobacco comments:    1/2 ppd  Vaping Use   Vaping Use: Every day  Substance and Sexual Activity   Alcohol use: Yes    Alcohol/week: 2.0 - 4.0 standard drinks of alcohol    Types: 2 - 4 Cans of beer per week    Comment: 2-4 beers per day   Drug use: No   Sexual activity: Yes    Partners: Female  Other Topics Concern   Not on file  Social History Narrative   His brother lives with him and his wife. Daughter lives in [place], son lives nearby.   Social Determinants of  Health   Financial Resource Strain: Low Risk  (11/28/2021)   Overall Financial Resource Strain (CARDIA)    Difficulty of Paying Living Expenses: Not hard at all  Food Insecurity: No Food Insecurity (11/28/2021)   Hunger Vital Sign    Worried About Running Out of Food in the Last Year: Never true    Ran Out of Food in the Last Year: Never true  Transportation Needs: No Transportation Needs (11/28/2021)   PRAPARE - Hydrologist (Medical): No    Lack of Transportation (Non-Medical): No  Physical Activity: Insufficiently Active (11/28/2021)   Exercise Vital Sign    Days of Exercise per Week: 7 days    Minutes of Exercise per Session: 20 min  Stress: No Stress Concern Present (11/28/2021)   [place]    Feeling of Stress : Not at all  Social Connections: Moderately Isolated (11/28/2021)   Social Connection and Isolation Panel [NHANES]    Frequency of Communication with Friends and Family: More than three times a week    Frequency of Social Gatherings with Friends and Family: More than three times a week    Attends Religious Services: Never    Marine scientist or Organizations: No    Attends Archivist Meetings: Never    Marital Status: Married  Human resources officer Violence: Not At Risk (11/28/2021)   Humiliation, Afraid, Rape, and Kick questionnaire    Fear of Current or Ex-Partner: No    Emotionally Abused: No    Physically Abused: No    Sexually Abused: No    Family History:     Family History  Problem Relation Age of Onset   Diabetes Mother    Hypertension Father    Heart disease Brother    Diabetes Brother    Heart disease Brother    Diabetes Brother    Multiple sclerosis Daughter    Healthy Son    Colon cancer Neg Hx    Inflammatory bowel disease Neg Hx      ROS:  Please see the  history of present illness.  Review of Systems  Constitutional: Negative.  HENT:  Negative.    Cardiovascular:  Positive for chest pain, dyspnea on exertion, irregular heartbeat and palpitations.  Respiratory:  Positive for shortness of breath.   Endocrine: Negative.   Hematologic/Lymphatic: Negative.   Musculoskeletal: Negative.   Gastrointestinal: Negative.   Genitourinary: Negative.   Neurological: Negative.     All other ROS reviewed and negative.     Physical Exam/Data:   Vitals:   03/26/22 2200 03/26/22 2300 03/27/22 0300 03/27/22 0716  BP: 139/86 137/83    Pulse: 77 74  90  Resp: (!) 22 (!) 21  19  Temp:  98.2 F (36.8 C) 97.6 F (36.4 C) 98.2 F (36.8 C)  TempSrc:  Oral Oral Oral  SpO2: 90% 90%  96%  Weight:   64.2 kg   Height:        Intake/Output Summary (Last 24 hours) at 03/27/2022 0831 Last data filed at 03/27/2022 0400 Gross per 24 hour  Intake 916.63 ml  Output 200 ml  Net 716.63 ml      03/27/2022    3:00 AM 03/26/2022    3:50 PM 03/25/2022    7:06 PM  Last 3 Weights  Weight (lbs) 141 lb 8.6 oz 139 lb 8.8 oz 149 lb  Weight (kg) 64.2 kg 63.3 kg 67.586 kg     Body mass index is 20.9 kg/m.  General:Thin, in no acute distress  HEENT: normal Neck: no JVD Vascular: No carotid bruits; Distal pulses 2+ bilaterally Cardiac:  normal S1, S2; irreg irreg; no murmur   Lungs: decreased breath sounds throughout, no rales Abd: soft, nontender, no hepatomegaly  Ext: no edema Musculoskeletal:  No deformities, BUE and BLE strength normal and equal Skin: warm and dry  Neuro:  CNs 2-12 intact, no focal abnormalities noted Psych:  Normal affect   EKG:  The EKG was personally reviewed and demonstrates:  Afib with nonspecific ST changes, no acute change Telemetry:  Telemetry was personally reviewed and demonstrates:  Afib with PVC's  Relevant CV Studies:   11/25/2009:   Cardiac Cath Findings: Left ventricular angiogram was performed in the RAO projection and   showed normal left ventricular systolic function with ejection   fraction  estimated at 50%. Mild mitral regurgitation was noted.   10.Aortic root angiogram was performed and did not show enlargement of   the aortic root.   IMPRESSION:   1. Severe triple-vessel coronary artery disease.   2. Patent bypass grafts 4/4.   3. Low normal left ventricular systolic function.     12/2012 Myoview Inferolateral scar, no active ischemia, LVEF 46%.     05/01/13 Echo: LVEF 55-60%, grade II diastolic dysfunction, multiple WMAs, mild MR,     06/2016 AAA [place] No aneurysm     02/2022 echo IMPRESSIONS     1. Left ventricular ejection fraction, by estimation, is 40 to 45%. The  left ventricle has mildly decreased function. The left ventricle  demonstrates regional wall motion abnormalities (see scoring  diagram/findings for description). There is mild  asymmetric left ventricular hypertrophy of the basal segment. Left  ventricular diastolic parameters are indeterminate. The average left  ventricular global longitudinal strain is -10.0 %. The global longitudinal  strain is abnormal.   2. Right ventricular systolic function is mildly reduced. The right  ventricular size is normal. There is normal pulmonary artery systolic  pressure. The estimated right ventricular systolic pressure is 17.5 mmHg.   3. Left atrial  size was mildly dilated.   4. The mitral valve is grossly normal. Mild to moderate mitral valve  regurgitation.   5. Tricuspid valve regurgitation is mild to moderate.   6. The aortic valve is tricuspid. Aortic valve regurgitation is not  visualized.   7. The inferior vena cava is normal in size with greater than 50%  respiratory variability, suggesting right atrial pressure of 3 mmHg.      Laboratory Data:  High Sensitivity Troponin:   Recent Labs  Lab 03/25/22 1909 03/25/22 2118 03/26/22 0514  TROPONINIHS 21* 20* 17     Chemistry Recent Labs  Lab 03/25/22 1909 03/26/22 0514 03/27/22 0408  NA 141 139 140  K 3.1* 3.5 3.5  CL 101 104 108  CO2 '31 28  26  '$ GLUCOSE 129* 100* 91  BUN 7* 8 12  CREATININE 1.08 0.95 0.92  CALCIUM 9.4 8.8* 8.3*  MG 1.1* 1.7  --   GFRNONAA >60 >60 >60  ANIONGAP '9 7 6    '$ Recent Labs  Lab 03/25/22 1909 03/26/22 0514  PROT 7.7 6.5  ALBUMIN 3.9 3.3*  AST 37 29  ALT 25 22  ALKPHOS 107 92  BILITOT 1.1 1.1   Lipids  Recent Labs  Lab 03/26/22 0514  CHOL 116  TRIG 108  HDL 43  LDLCALC 51  CHOLHDL 2.7    Hematology Recent Labs  Lab 03/25/22 1909 03/26/22 0514  WBC 8.0 6.2  RBC 3.82* 3.44*  HGB 13.1 11.7*  HCT 37.0* 33.8*  MCV 96.9 98.3  MCH 34.3* 34.0  MCHC 35.4 34.6  RDW 13.9 13.9  PLT 193 175   Thyroid No results for input(s): "TSH", "FREET4" in the last 168 hours.  BNPNo results for input(s): "BNP", "PROBNP" in the last 168 hours.  DDimer No results for input(s): "DDIMER" in the last 168 hours.   Radiology/Studies:  DG Chest Port 1 View  Result Date: 03/25/2022 CLINICAL DATA:  Sternal chest pain and left-sided chest pain EXAM: PORTABLE CHEST 1 VIEW COMPARISON:  01/17/2017 FINDINGS: Status post median sternotomy, internal mammary artery dissection, and CABG. No evidence of sternal wire fracture on this single view. Cardiac and mediastinal contours are within normal limits given AP technique. Hyperinflated lungs. No focal pulmonary opacity. No pleural effusion or pneumothorax. No acute osseous abnormality. IMPRESSION: No acute cardiopulmonary process. Electronically Signed   By: Merilyn Baba M.D.   On: 03/25/2022 19:29     Assessment and Plan:   Chest pain, flat troponins, some atypical features and seemed to start with med changes. He does have 76 yr old grafts and continues to smoke so likely has progression of disease. Had Coumadinlast night but INR 1.4 this am. Reasonable to do ischemic workup with new LVD but symptoms could also be due to med changes, Afib and elevated BP. Will discuss with [name]. No relief of chest pain from NTG SL here.  CAD prior MI 1997, CABG 2001, cath  2011 patent grafts now with chest pain  Systolic CHF-new diagnosis 02/2022 LVEF 40-45% with recent med changes-norvasc stopped and aldactone started, metoprolol changed to Toprol XL  AFib on Coumadin   HTN-BP high this am-giving NTG now and am meds  HLD on lipitor  COPD-smoking cessation discussed     Risk Assessment/Risk Scores:     HEAR Score (for undifferentiated chest pain):       CHA2DS2-VASc Score = 4   This indicates a 4.8% annual risk of stroke. The patient's score is based upon: CHF  History: 1 HTN History: 1 Diabetes History: 0 Stroke History: 0 Vascular Disease History: 1 Age Score: 1 Gender Score: 0         For questions or updates, please contact [facility] Please consult [email or web address] for contact info under    Signed, Ermalinda Barrios, PA-C  03/27/2022 8:31 AM  Attending note Patient seen and discussed with PA Bonnell Public, I agree with her documentation. 75 yo male history of chronic systolic HF LVE 67-12%(WPYKD diagnosed 02/2022), CAD prior MI in 1997 with prior CABG, HTN, HL, COPD, afib, presents with chest pain   WBC 8 Hgb 13.1 Plt 193 K 3.1 Cr 1.08 INR 2.7 Mg 1.1 LDL 51 Trop 21-->20--> EKG afib, no specific ischemic changes 02/2022 echo: LVE 98-33%, indet diastolic, mild RV dysfunction, mild to mod MR. The antero-lateral wall, inferior wall, and posterior wall are hypokinetic.  CXR no acute process  1.CAD/chest pain - history of prior CABG - atypical chest pain. On my history positional, lasting several hours, in ER was reproducible.  - Trop very mild and flat, ekg without specific ischemic changes. - The main caveat is the mild drop in his LVEF from 02/2022 echo and WMAs. We had made initial medication changes last visit with plans to consider ischemic testing in the near future once optimized on medical therapy. Given these new symptoms though somewhat atypical would consider going ahead and reevaluating coronaries as inpatient, particularly now that  INR has come down. - plan for RHC/LHC, [facility]  2. Chronic systolic HF (new diagnosis 02/2022) - 02/2022 echo: LVE 82-50%, indet diastolic, mild RV dysfunction, mild to mod MR. The antero-lateral wall, inferior wall, and posterior wall are hypokinetic. - Medical therapy with toprol 25, aldactone 12.'5mg'$ . Previous elevated K on ACEi, will try losartan '25mg'$  daily and if tolerates perhaps transition to entresto in the future. Add SGLT2i later in admit. Increase toprol to '50mg'$  daily  3.Afib - coumadin on hold for possible cath, INR 1.4. Eliquis was too expensive in the past - rate controlled on toprol - no strong indication for heparin bridging, resume coumadin after cath.   Shared Decision Making/Informed Consent The risks [stroke (1 in 1000), death (1 in 1000), kidney failure [usually temporary] (1 in 500), bleeding (1 in 200), allergic reaction [possibly serious] (1 in 200)], benefits (diagnostic support and management of coronary artery disease) and alternatives of a cardiac catheterization were discussed in detail with Mr. Kindle and he is willing to proceed.    [name] MD

## 2022-03-27 NOTE — TOC Progression Note (Signed)
  Transition of Care [facility]) Screening Note   Patient Details  Name: [name] Date of Birth: [birth year]   [facility]) CM/SW Contact:    Shade Flood, LCSW Phone Number: 03/27/2022, 10:46 AM    Transition of Care Department [facility]) has reviewed patient and no TOC needs have been identified at this time. We will continue to monitor patient advancement through interdisciplinary progression rounds. If new patient transition needs arise, please place a TOC consult.

## 2022-03-27 NOTE — Interval H&P Note (Signed)
History and Physical Interval Note:  03/27/2022 1:47 PM  [name]  has presented today for surgery, with the diagnosis of unstable angina.  The various methods of treatment have been discussed with the patient and family. After consideration of risks, benefits and other options for treatment, the patient has consented to  Procedure(s): RIGHT/LEFT HEART CATH AND CORONARY/GRAFT ANGIOGRAPHY (N/A) as a surgical intervention.  The patient's history has been reviewed, patient examined, no change in status, stable for surgery.  I have reviewed the patient's chart and labs.  Questions were answered to the patient's satisfaction.   Cath Lab Visit (complete for each Cath Lab visit)  Clinical Evaluation Leading to the Procedure:   ACS: Yes.    Non-ACS:    Anginal Classification: CCS II  Anti-ischemic medical therapy: Minimal Therapy (1 class of medications)  Non-Invasive Test Results: No non-invasive testing performed  Prior CABG: Previous CABG        [name] [facility] 03/27/2022 1:47 PM

## 2022-03-27 NOTE — Progress Notes (Signed)
PROGRESS NOTE    Patient: [name]                            PCP: [name], MD                    DOB: 1945-09-04             DOA: 03/25/2022 YIR:[id number]              DOS: 03/27/2022, 10:55 AM   LOS: 1 day   Date of Service: The patient was seen and examined on 03/27/2022  Subjective:   The patient was seen and examined this morning, stable hemodynamically but complaining of chest pain.  Nitroglycerin aspirin was given, repeating EKG   Discussed with cardiology Dr. Suzan Nailer transfer to [facility], for possible cardiac cath  Brief Narrative:   [name] is a 76 y.o. male with medical history significant of atrial fibrillation-on warfarin, coronary artery disease, COPD, GERD, alcohol use, hypertension, current smoker, and more presents the ED with a chief complaint of chest pain.   Patient reports the chest pain started last night.  He was on the bed when the pain started.  He could not sleep most of the night.  Positional changes helped the pain, like changing from laying on his left side to laying flat on his back.  Patient describes the chest pain as pins-and-needles in his chest.  At first it was substernal and then it moved to the left side.  It does feel like a mild pressure as well.  He endorses shortness of breath, but not much worse than baseline.  Patient reports he walked to the mailbox and back today and had significant palpitations and had to sit down.  He felt more short of breath at that time.   Patient has nitro at home but did not try it.  Chest pain is reproducible with palpation.     Patient is a current smoker, half a pack per day.  He drinks 2-3 beers per day and has never had withdrawals.  Does not use any illicit drugs.   He is vaccinated for COVID.   Patient is DNR.   Patient was last seen by his cardiologist at the beginning of August.  He had lower extremity edema, shortness of breath, and an abnormal echo.   Echo in 2021 had revealed EJF 55%  Echo  in July 2023 revealed 40-45% ejection fraction, RV dysfunction, and mild to moderate MR.  Patient is not on an ACE due to hyperkalemia.  He had recent med changes with Toprol being started, Norvasc being discontinued, Aldactone being added.   Ischemic testing was deferred at that time due to mild symptoms, but now with chest pain may be warranted.   ED Temp 98.9, heart rate 73-95, respiratory 17-23, blood pressure 130/88-160/104 maintaining oxygen sats on room air No leukocytosis, hemoglobin 13.1, platelets 193 Hypokalemia at 3.1 slightly elevated bicarb at 31 likely related to his COPD Troponin downtrending 21, 20 INR therapeutic 2.7 Chest x-ray which shows no acute cardiopulmonary process EKG shows a heart rate of 93, A-fib, QTc 498 Cardiology was consulted and recommends admission to [facility] for possible nonemergent cath    Assessment & Plan:   Principal Problem:   Chest pain Active Problems:   Hyperlipidemia   TOBACCO USER   GERD (gastroesophageal reflux disease)   CAD (coronary artery disease)   Essential hypertension  COPD (chronic obstructive pulmonary disease) (HCC)   Atrial fibrillation (HCC)   Hypokalemia     Assessment and Plan: * Chest pain -Still having chest pain this morning, nitroglycerin and aspirin given, repeating EKG (seems to be atypical)  -Been seen and evaluated cardiology, planning for cardiac catheterization today 03/27/2022 at [facility]  -Troponins downtrending 21, 20, 17, 20 -Lipid panel >> LDL: 51 -On Coumadin, INR 2.3   -Monitor on telemetry  Hypokalemia - 3.1 on chemistry >>> 3.5  -60 mEq replaced in the ED -Mag sulfate also given in the ED -Trending   Atrial fibrillation ([place]) - Stable - EKG in rate controlled A-fib - Continue metoprolol - Continue warfarin >> holding for today possibility of cardiac cath  COPD (chronic obstructive pulmonary disease) (HCC) - Continue albuterol as needed -Continue  budesonide-glycopyrrolate-formoterol inhaler -Counseled on smoking cessation  Essential hypertension - Continue Toprol, spironolactone  CAD (coronary artery disease) - Parison chest pain with a history of CABG -Cardiology evaluating for possible cardiac catheterization today 03/27/2022 [facility]   - With history of CABG -Continue statin, beta-blocker   GERD (gastroesophageal reflux disease) - Continue Protonix   TOBACCO USER - Advised on the importance of cessation -Smokes half a pack per day -Nicotine patch ordered  Hyperlipidemia - Continue statin Lipid Panel     Component Value Date/Time   CHOL 116 03/26/2022 0514   CHOL 120 11/28/2021 1345   TRIG 108 03/26/2022 0514   HDL 43 03/26/2022 0514   HDL 49 11/28/2021 1345   CHOLHDL 2.7 03/26/2022 0514   VLDL 22 03/26/2022 0514   LDLCALC 51 03/26/2022 0514   LDLCALC 50 11/28/2021 1345   LABVLDL 21 11/28/2021 1345        ------------------------------------------------------------------------------------------------------------------------------------------------  DVT prophylaxis:  SCDs Start: 03/25/22 2334   Code Status:   Code Status: DNR  Family Communication: No family member present at bedside- attempt will be made to update daily The above findings and plan of care has been discussed with patient (and family)  in detail,  they expressed understanding and agreement of above. -Advance care planning has been discussed.   Admission status:   Status is: Observation The patient remains OBS appropriate and will d/c before 2 midnights.     Procedures:   No admission procedures for hospital encounter.   Antimicrobials:  Anti-infectives (From admission, onward)    None        Medication:   atorvastatin  80 mg Oral Daily   Chlorhexidine Gluconate Cloth  6 each Topical Daily   losartan  25 mg Oral Daily   [START ON 03/28/2022] metoprolol succinate  50 mg Oral Daily   mometasone-formoterol  2 puff  Inhalation BID   nicotine  14 mg Transdermal Daily   pantoprazole  40 mg Oral Daily   spironolactone  12.5 mg Oral Daily   umeclidinium bromide  1 puff Inhalation Daily   Warfarin - Physician Dosing Inpatient   Does not apply q1600    acetaminophen, albuterol, nitroGLYCERIN, ondansetron (ZOFRAN) IV, mouth rinse   Objective:   Vitals:   03/27/22 0300 03/27/22 0716 03/27/22 0832 03/27/22 0936  BP:    (!) 162/91  Pulse:  90  80  Resp:  19  (!) 21  Temp: 97.6 F (36.4 C) 98.2 F (36.8 C)    TempSrc: Oral Oral    SpO2:  96% 93% 94%  Weight: 64.2 kg     Height:        Intake/Output Summary (Last 24 hours) at  03/27/2022 1055 Last data filed at 03/27/2022 0400 Gross per 24 hour  Intake 916.63 ml  Output 200 ml  Net 716.63 ml   Filed Weights   03/25/22 1906 03/26/22 1550 03/27/22 0300  Weight: 67.6 kg 63.3 kg 64.2 kg     Examination:    General:  AAO x 3,  cooperative, no distress;   HEENT:  Normocephalic, PERRL, otherwise with in Normal limits   Neuro:  CNII-XII intact. , normal motor and sensation, reflexes intact   Lungs:   Clear to auscultation BL, Respirations unlabored,  No wheezes / crackles  Cardio:    S1/S2, RRR, No murmure, No Rubs or Gallops   Abdomen:  Soft, non-tender, bowel sounds active all four quadrants, no guarding or peritoneal signs.  Muscular  skeletal:  Limited exam -global generalized weaknesses - in bed, able to move all 4 extremities,   2+ pulses,  symmetric, No pitting edema  Skin:  Dry, warm to touch, negative for any Rashes,  Wounds: Please see nursing documentation      ------------------------------------------------------------------------------------------------------------------------------------------    LABs:     Latest Ref Rng & Units 03/26/2022    5:14 AM 03/25/2022    7:09 PM 11/28/2021    1:45 PM  CBC  WBC 4.0 - 10.5 K/uL 6.2  8.0  6.0   Hemoglobin 13.0 - 17.0 g/dL 11.7  13.1  12.7   Hematocrit 39.0 - 52.0 % 33.8  37.0   37.0   Platelets 150 - 400 K/uL 175  193  220       Latest Ref Rng & Units 03/27/2022    4:08 AM 03/26/2022    5:14 AM 03/25/2022    7:09 PM  CMP  Glucose 70 - 99 mg/dL 91  100  129   BUN 8 - 23 mg/dL '12  8  7   '$ Creatinine 0.61 - 1.24 mg/dL 0.92  0.95  1.08   Sodium 135 - 145 mmol/L 140  139  141   Potassium 3.5 - 5.1 mmol/L 3.5  3.5  3.1   Chloride 98 - 111 mmol/L 108  104  101   CO2 22 - 32 mmol/L '26  28  31   '$ Calcium 8.9 - 10.3 mg/dL 8.3  8.8  9.4   Total Protein 6.5 - 8.1 g/dL  6.5  7.7   Total Bilirubin 0.3 - 1.2 mg/dL  1.1  1.1   Alkaline Phos 38 - 126 U/L  92  107   AST 15 - 41 U/L  29  37   ALT 0 - 44 U/L  22  25        Micro Results Recent Results (from the past 240 hour(s))  MRSA Next Gen by PCR, Nasal     Status: None   Collection Time: 03/26/22  3:27 PM   Specimen: Nasal Mucosa; Nasal Swab  Result Value Ref Range Status   MRSA by PCR Next Gen NOT DETECTED NOT DETECTED Final    Comment: (NOTE) The GeneXpert MRSA Assay (FDA approved for NASAL specimens only), is one component of a comprehensive MRSA colonization surveillance program. It is not intended to diagnose MRSA infection nor to guide or monitor treatment for MRSA infections. Test performance is not FDA approved in patients less than 21 years old. Performed at [facility], [address], [place], [place] [address]     Radiology Reports No results found.  SIGNED: Deatra James, MD, FHM. [facility],  Pager (please use [email or web address] to page/text)  Please use Epic Secure Chat for non-urgent communication (7AM-7PM)  If 7PM-7AM, please contact night-coverage [email or web address], 03/27/2022, 10:55 AM

## 2022-03-27 NOTE — Progress Notes (Signed)
11f sheath aspirated and removed from right femoral vein. Manual pressure applied for 5 minutes. Then the 671farterial sheath was removed. Manual pressure applied. Patient had vagal sysmtoms 10 minutes into hold, feeling sweaty, nauseated, and his systolic pressure fell from 14739m to 32m42m Fluid bolus 130cc aprox given. Manual pressure continued over both sites for 30 minutes. Site level 0 , no s+s of hematoma, tegaderm dressing applied, bedrest instructions given.  Bilateral dp and pt pulses palpable, left dp weaker.    Bedrest begins at 20:00

## 2022-03-27 NOTE — Consult Note (Addendum)
Cardiology Consultation:   [name] MRN: [id number]; DOB: [birth year]  Admit date: 03/25/2022 Date of Consult: 03/27/2022  PCP:  [name], MD   [facility] Cardiologist:  [name], MD        Patient Profile:   [name] is a 76 y.o. male with a hx of CAD who is being seen 03/27/2022 for the evaluation of chest pain at the request of [name].  History of Present Illness:   [name] is a 76 yo male patient with history of CAD, inf MI 1997, CABG 2001, patent grafts 2011, HTN, HLD, PAF on coumadin, COPD. Patient has new systolic CHF 11/8183 LVEF 63-14% being treated medically. He saw [name] 03/08/22 with some medication adjustments-stopped norvasc and started spironolactone, metoprolol changed to Toprol XL.  Patient admitted with chest pain troponins flat 21,20. EKG without acute change. Patient says the night his meds were changed he had trouble sleeping and chest pain-worse when he laid on his left side. Got short of breath walking to the mailbox with heart racing. Chest pain like pins and needles and tender to palpation yesterday. Says he's had some tightness since 5 am but didn't tell anyone. BP high now. Still smokes 1/2 ppd.   Past Medical History:  Diagnosis Date   Anginal pain ([place])    Atrial fibrillation by electrocardiogram ([place]) 11/2019   Atrial flutter by electrocardiogram [facility]) 11/2019   Bladder cancer ([place]) 01/2020   Bladder tumor    BPH (benign prostatic hyperplasia)    CAD (coronary artery disease)    a. s/p INF MI 1997;  b. s/p CABG 2001;  c. [place] 11/2009:  3v CAD, S-PDA ok with 40% mid, S-OM ok, S-Dx ok, L-LAD ok, EF 50%;  d.  [facility] 5/14:  Inferolateral scar, EF 46%, no ischemia   COPD (chronic obstructive pulmonary disease) (HCC)    Dyspnea    ETOH abuse    GERD (gastroesophageal reflux disease)    History of gout    Hypercholesterolemia    Hypertension    Myocardial infarction [facility]) 2013   Prostate cancer  ([place])    Tobacco abuse     Past Surgical History:  Procedure Laterality Date   ANTERIOR CERVICAL DECOMPRESSION/DISCECTOMY FUSION 4 LEVELS N/A 07/21/2016   Procedure: ANTERIOR CERVICAL DECOMPRESSION/DISCECTOMY FUSION CERVICAL TWO-THREE, CERVICAL THREE-FOUR,. CERVICAL FOUR-FIVE, CERVICAL  FIVE-SIX;  Surgeon: Ashok Pall, MD;  Location: [place];  Service: Neurosurgery;  Laterality: N/A;   BACK SURGERY     CARDIAC CATHETERIZATION  2011   CARDIOVERSION N/A 06/10/2020   Procedure: CARDIOVERSION;  Surgeon: Arnoldo Lenis, MD;  [facility];  Service: Endoscopy;  Laterality: N/A;   COLONOSCOPY     in remote past, Dr. Sharlett Iles. Obtaining records.    COLONOSCOPY N/A 11/21/2017   Surgeon: Daneil Dolin, MD; Three 4-6 mm polyps in the rectum and splenic flexure resected and retrieved (tubular adenomas), nonbleeding internal hemorrhoids.  Repeat in 5 years.   COLONOSCOPY WITH PROPOFOL N/A 06/09/2021   Surgeon: Daneil Dolin, MD;  Three 3-7 mm polyps in the ascending colon resected and retrieved, mild radiation proctitis, otherwise normal exam. Pathology revealed tubular adenomas.  Recommended repeat colonoscopy in 5 years.   CORONARY ANGIOPLASTY     CORONARY ARTERY BYPASS GRAFT     ESOPHAGOGASTRODUODENOSCOPY  05/27/2012   HFW:YOVZCH esophagus, stomach and duodenum s/p dilator   POLYPECTOMY  11/21/2017   Procedure: POLYPECTOMY;  Surgeon: Daneil Dolin, MD;  [facility];  Service: Endoscopy;;  colon   POLYPECTOMY  06/09/2021   Procedure: POLYPECTOMY;  Surgeon: Daneil Dolin, MD;  [facility];  Service: Endoscopy;;   PROSTATE SURGERY     TRANSURETHRAL RESECTION OF BLADDER TUMOR WITH MITOMYCIN-C Left 01/06/2020   Procedure: CYSTOSCOPY TRANSURETHRAL RESECTION OF BLADDER TUMOR,LEFT URETERSCOPY AND GEMCITABINE;  Surgeon: Irine Seal, MD;  Location: [facility];  Service: Urology;  Laterality: Left;     Home Medications:  Prior to Admission medications    Medication Sig Start Date End Date Taking? Authorizing Provider  acetaminophen (TYLENOL) 500 MG tablet Take 2 tablets (1,000 mg total) by mouth every 6 (six) hours as needed for moderate pain or headache. 03/03/20  Yes [name], MD  albuterol (VENTOLIN HFA) 108 (90 Base) MCG/ACT inhaler Inhale 2 puffs into the lungs every 6 (six) hours as needed for wheezing or shortness of breath. 11/28/21  Yes [name], MD  allopurinol (ZYLOPRIM) 100 MG tablet Take 1 tablet (100 mg total) by mouth daily. 05/30/21  Yes [name], MD  atorvastatin (LIPITOR) 80 MG tablet TAKE 1 TABLET EVERY DAY AT 6 PM Patient taking differently: Take 80 mg by mouth daily at 6 PM. 05/30/21  Yes Stacks, Cletus Gash, MD  B Complex Vitamins (VITAMIN B COMPLEX PO) Take 1 tablet by mouth daily.   Yes [provider]  Budeson-Glycopyrrol-Formoterol (BREZTRI AEROSPHERE) 160-9-4.8 MCG/ACT AERO Take 2 puffs by mouth in the morning and at bedtime. Dispense 3 inhalers 12/06/21  Yes Stacks, Cletus Gash, MD  Cholecalciferol (VITAMIN D3) 125 MCG (5000 UT) CAPS Take 5,000 Units by mouth daily.   Yes [provider]  fluticasone (FLONASE) 50 MCG/ACT nasal spray Place 2 sprays into both nostrils daily as needed for allergies. 11/24/20  Yes [name], MD  furosemide (LASIX) 20 MG tablet Take 1 tablet (20 mg total) by mouth as needed (swelling). 02/02/22  Yes Landry Lookingbill, Alphonse Guild, MD  metoprolol succinate (TOPROL XL) 25 MG 24 hr tablet Take 1 tablet (25 mg total) by mouth daily. 03/08/22  Yes Albaraa Swingle, Alphonse Guild, MD  nitroGLYCERIN (NITROSTAT) 0.4 MG SL tablet Place 1 tablet (0.4 mg total) under the tongue every 5 (five) minutes as needed for chest pain. 11/24/20  Yes Stacks, Cletus Gash, MD  pantoprazole (PROTONIX) 40 MG tablet Take 1 tablet (40 mg total) by mouth daily. 12/13/21  Yes [name], MD  spironolactone (ALDACTONE) 25 MG tablet Take 0.5 tablets (12.5 mg total) by mouth daily. 03/08/22  Yes BranchAlphonse Guild, MD  warfarin  (COUMADIN) 5 MG tablet Take 1 tablet (5 mg total) by mouth daily. Patient taking differently: Take 2.5-5 mg by mouth See admin instructions. 5 mg on Monday, 2.5 mg all other days 05/30/21  Yes [name], MD    Inpatient Medications: Scheduled Meds:  atorvastatin  80 mg Oral Daily   Chlorhexidine Gluconate Cloth  6 each Topical Daily   metoprolol succinate  25 mg Oral Daily   mometasone-formoterol  2 puff Inhalation BID   nicotine  14 mg Transdermal Daily   pantoprazole  40 mg Oral Daily   spironolactone  12.5 mg Oral Daily   umeclidinium bromide  1 puff Inhalation Daily   warfarin  2.5 mg Oral Once per day on Sun Tue Wed Thu Fri Sat   And   warfarin  5 mg Oral Q Mon   Warfarin - Physician Dosing Inpatient   Does not apply q1600   Continuous Infusions:  sodium chloride 50 mL/hr at 03/26/22  1817   PRN Meds: acetaminophen, albuterol, nitroGLYCERIN, ondansetron (ZOFRAN) IV, mouth rinse  Allergies:   No Known Allergies  Social History:   Social History   Socioeconomic History   Marital status: Married    Spouse name: Not on file   Number of children: 2   Years of education: Not on file   Highest education level: Not on file  Occupational History    Comment: retired  Tobacco Use   Smoking status: Every Day    Packs/day: 0.50    Years: 58.00    Total pack years: 29.00    Types: Cigarettes, E-cigarettes    Start date: 03/08/1970   Smokeless tobacco: Former    Types: Snuff    Quit date: 03/08/2012   Tobacco comments:    1/2 ppd  Vaping Use   Vaping Use: Every day  Substance and Sexual Activity   Alcohol use: Yes    Alcohol/week: 2.0 - 4.0 standard drinks of alcohol    Types: 2 - 4 Cans of beer per week    Comment: 2-4 beers per day   Drug use: No   Sexual activity: Yes    Partners: Female  Other Topics Concern   Not on file  Social History Narrative   His brother lives with him and his wife. Daughter lives in [place], son lives nearby.   Social Determinants of  Health   Financial Resource Strain: Low Risk  (11/28/2021)   Overall Financial Resource Strain (CARDIA)    Difficulty of Paying Living Expenses: Not hard at all  Food Insecurity: No Food Insecurity (11/28/2021)   Hunger Vital Sign    Worried About Running Out of Food in the Last Year: Never true    Ran Out of Food in the Last Year: Never true  Transportation Needs: No Transportation Needs (11/28/2021)   PRAPARE - Hydrologist (Medical): No    Lack of Transportation (Non-Medical): No  Physical Activity: Insufficiently Active (11/28/2021)   Exercise Vital Sign    Days of Exercise per Week: 7 days    Minutes of Exercise per Session: 20 min  Stress: No Stress Concern Present (11/28/2021)   [place]    Feeling of Stress : Not at all  Social Connections: Moderately Isolated (11/28/2021)   Social Connection and Isolation Panel [NHANES]    Frequency of Communication with Friends and Family: More than three times a week    Frequency of Social Gatherings with Friends and Family: More than three times a week    Attends Religious Services: Never    Marine scientist or Organizations: No    Attends Archivist Meetings: Never    Marital Status: Married  Human resources officer Violence: Not At Risk (11/28/2021)   Humiliation, Afraid, Rape, and Kick questionnaire    Fear of Current or Ex-Partner: No    Emotionally Abused: No    Physically Abused: No    Sexually Abused: No    Family History:     Family History  Problem Relation Age of Onset   Diabetes Mother    Hypertension Father    Heart disease Brother    Diabetes Brother    Heart disease Brother    Diabetes Brother    Multiple sclerosis Daughter    Healthy Son    Colon cancer Neg Hx    Inflammatory bowel disease Neg Hx      ROS:  Please see the  history of present illness.  Review of Systems  Constitutional: Negative.  HENT:  Negative.    Cardiovascular:  Positive for chest pain, dyspnea on exertion, irregular heartbeat and palpitations.  Respiratory:  Positive for shortness of breath.   Endocrine: Negative.   Hematologic/Lymphatic: Negative.   Musculoskeletal: Negative.   Gastrointestinal: Negative.   Genitourinary: Negative.   Neurological: Negative.     All other ROS reviewed and negative.     Physical Exam/Data:   Vitals:   03/26/22 2200 03/26/22 2300 03/27/22 0300 03/27/22 0716  BP: 139/86 137/83    Pulse: 77 74  90  Resp: (!) 22 (!) 21  19  Temp:  98.2 F (36.8 C) 97.6 F (36.4 C) 98.2 F (36.8 C)  TempSrc:  Oral Oral Oral  SpO2: 90% 90%  96%  Weight:   64.2 kg   Height:        Intake/Output Summary (Last 24 hours) at 03/27/2022 0831 Last data filed at 03/27/2022 0400 Gross per 24 hour  Intake 916.63 ml  Output 200 ml  Net 716.63 ml      03/27/2022    3:00 AM 03/26/2022    3:50 PM 03/25/2022    7:06 PM  Last 3 Weights  Weight (lbs) 141 lb 8.6 oz 139 lb 8.8 oz 149 lb  Weight (kg) 64.2 kg 63.3 kg 67.586 kg     Body mass index is 20.9 kg/m.  General:Thin, in no acute distress  HEENT: normal Neck: no JVD Vascular: No carotid bruits; Distal pulses 2+ bilaterally Cardiac:  normal S1, S2; irreg irreg; no murmur   Lungs: decreased breath sounds throughout, no rales Abd: soft, nontender, no hepatomegaly  Ext: no edema Musculoskeletal:  No deformities, BUE and BLE strength normal and equal Skin: warm and dry  Neuro:  CNs 2-12 intact, no focal abnormalities noted Psych:  Normal affect   EKG:  The EKG was personally reviewed and demonstrates:  Afib with nonspecific ST changes, no acute change Telemetry:  Telemetry was personally reviewed and demonstrates:  Afib with PVC's  Relevant CV Studies:   11/25/2009:   Cardiac Cath Findings: Left ventricular angiogram was performed in the RAO projection and   showed normal left ventricular systolic function with ejection   fraction  estimated at 50%. Mild mitral regurgitation was noted.   10.Aortic root angiogram was performed and did not show enlargement of   the aortic root.   IMPRESSION:   1. Severe triple-vessel coronary artery disease.   2. Patent bypass grafts 4/4.   3. Low normal left ventricular systolic function.     12/2012 Myoview Inferolateral scar, no active ischemia, LVEF 46%.     05/01/13 Echo: LVEF 55-60%, grade II diastolic dysfunction, multiple WMAs, mild MR,     06/2016 [facility] [place] No aneurysm     02/2022 echo IMPRESSIONS     1. Left ventricular ejection fraction, by estimation, is 40 to 45%. The  left ventricle has mildly decreased function. The left ventricle  demonstrates regional wall motion abnormalities (see scoring  diagram/findings for description). There is mild  asymmetric left ventricular hypertrophy of the basal segment. Left  ventricular diastolic parameters are indeterminate. The average left  ventricular global longitudinal strain is -10.0 %. The global longitudinal  strain is abnormal.   2. Right ventricular systolic function is mildly reduced. The right  ventricular size is normal. There is normal pulmonary artery systolic  pressure. The estimated right ventricular systolic pressure is 77.1 mmHg.   3. Left atrial  size was mildly dilated.   4. The mitral valve is grossly normal. Mild to moderate mitral valve  regurgitation.   5. Tricuspid valve regurgitation is mild to moderate.   6. The aortic valve is tricuspid. Aortic valve regurgitation is not  visualized.   7. The inferior vena cava is normal in size with greater than 50%  respiratory variability, suggesting right atrial pressure of 3 mmHg.      Laboratory Data:  High Sensitivity Troponin:   Recent Labs  Lab 03/25/22 1909 03/25/22 2118 03/26/22 0514  TROPONINIHS 21* 20* 17     Chemistry Recent Labs  Lab 03/25/22 1909 03/26/22 0514 03/27/22 0408  NA 141 139 140  K 3.1* 3.5 3.5  CL 101 104 108  CO2 '31 28  26  '$ GLUCOSE 129* 100* 91  BUN 7* 8 12  CREATININE 1.08 0.95 0.92  CALCIUM 9.4 8.8* 8.3*  MG 1.1* 1.7  --   GFRNONAA >60 >60 >60  ANIONGAP '9 7 6    '$ Recent Labs  Lab 03/25/22 1909 03/26/22 0514  PROT 7.7 6.5  ALBUMIN 3.9 3.3*  AST 37 29  ALT 25 22  ALKPHOS 107 92  BILITOT 1.1 1.1   Lipids  Recent Labs  Lab 03/26/22 0514  CHOL 116  TRIG 108  HDL 43  LDLCALC 51  CHOLHDL 2.7    Hematology Recent Labs  Lab 03/25/22 1909 03/26/22 0514  WBC 8.0 6.2  RBC 3.82* 3.44*  HGB 13.1 11.7*  HCT 37.0* 33.8*  MCV 96.9 98.3  MCH 34.3* 34.0  MCHC 35.4 34.6  RDW 13.9 13.9  PLT 193 175   Thyroid No results for input(s): "TSH", "FREET4" in the last 168 hours.  BNPNo results for input(s): "BNP", "PROBNP" in the last 168 hours.  DDimer No results for input(s): "DDIMER" in the last 168 hours.   Radiology/Studies:  DG Chest Port 1 View  Result Date: 03/25/2022 CLINICAL DATA:  Sternal chest pain and left-sided chest pain EXAM: PORTABLE CHEST 1 VIEW COMPARISON:  01/17/2017 FINDINGS: Status post median sternotomy, internal mammary artery dissection, and CABG. No evidence of sternal wire fracture on this single view. Cardiac and mediastinal contours are within normal limits given AP technique. Hyperinflated lungs. No focal pulmonary opacity. No pleural effusion or pneumothorax. No acute osseous abnormality. IMPRESSION: No acute cardiopulmonary process. Electronically Signed   By: Merilyn Baba M.D.   On: 03/25/2022 19:29     Assessment and Plan:   Chest pain, flat troponins, some atypical features and seemed to start with med changes. He does have 76 yr old grafts and continues to smoke so likely has progression of disease. Had Coumadinlast night but INR 1.4 this am. Reasonable to do ischemic workup with new LVD but symptoms could also be due to med changes, Afib and elevated BP. Will discuss with [name]. No relief of chest pain from NTG SL here.  CAD prior MI 1997, CABG 2001, cath  2011 patent grafts now with chest pain  Systolic CHF-new diagnosis 02/2022 LVEF 40-45% with recent med changes-norvasc stopped and aldactone started, metoprolol changed to Toprol XL  AFib on Coumadin   HTN-BP high this am-giving NTG now and am meds  HLD on lipitor  COPD-smoking cessation discussed     Risk Assessment/Risk Scores:     HEAR Score (for undifferentiated chest pain):       CHA2DS2-VASc Score = 4   This indicates a 4.8% annual risk of stroke. The patient's score is based upon: CHF  History: 1 HTN History: 1 Diabetes History: 0 Stroke History: 0 Vascular Disease History: 1 Age Score: 1 Gender Score: 0         For questions or updates, please contact [facility] Please consult [email or web address] for contact info under    Signed, Ermalinda Barrios, PA-C  03/27/2022 8:31 AM  Attending note Patient seen and discussed with PA Bonnell Public, I agree with her documentation. 76 yo male history of chronic systolic HF LVE 23-76%(EGBTD diagnosed 02/2022), CAD prior MI in 1997 with prior CABG, HTN, HL, COPD, afib, presents with chest pain   WBC 8 Hgb 13.1 Plt 193 K 3.1 Cr 1.08 INR 2.7 Mg 1.1 LDL 51 Trop 21-->20--> EKG afib, no specific ischemic changes 02/2022 echo: LVE 17-61%, indet diastolic, mild RV dysfunction, mild to mod MR. The antero-lateral wall, inferior wall, and posterior wall are hypokinetic.  CXR no acute process  1.CAD/chest pain - history of prior CABG - atypical chest pain. On my history positional, lasting several hours, in ER was reproducible.  - Trop very mild and flat, ekg without specific ischemic changes. - The main caveat is the mild drop in his LVEF from 02/2022 echo and WMAs. We had made initial medication changes last visit with plans to consider ischemic testing in the near future once optimized on medical therapy. Given these new symptoms though somewhat atypical would consider going ahead and reevaluating coronaries as inpatient, particularly now that  INR has come down. - plan for RHC/LHC, transfer to [facility]  2. Chronic systolic HF (new diagnosis 02/2022) - 02/2022 echo: LVE 60-73%, indet diastolic, mild RV dysfunction, mild to mod MR. The antero-lateral wall, inferior wall, and posterior wall are hypokinetic. - Medical therapy with toprol 25, aldactone 12.'5mg'$ . Previous elevated K on ACEi, will try losartan '25mg'$  daily and if tolerates perhaps transition to entresto in the future. Add SGLT2i later in admit. Increase toprol to '50mg'$  daily  3.Afib - coumadin on hold for possible cath, INR 1.4. Eliquis was too expensive in the past - rate controlled on toprol - no strong indication for heparin bridging, resume coumadin after cath.   Shared Decision Making/Informed Consent The risks [stroke (1 in 1000), death (1 in 1000), kidney failure [usually temporary] (1 in 500), bleeding (1 in 200), allergic reaction [possibly serious] (1 in 200)], benefits (diagnostic support and management of coronary artery disease) and alternatives of a cardiac catheterization were discussed in detail with [name] and he is willing to proceed.    [name] MD

## 2022-03-28 ENCOUNTER — Encounter (HOSPITAL_COMMUNITY): Payer: Self-pay | Admitting: Cardiology

## 2022-03-28 ENCOUNTER — Telehealth (HOSPITAL_COMMUNITY): Payer: Self-pay | Admitting: Pharmacy Technician

## 2022-03-28 ENCOUNTER — Other Ambulatory Visit (HOSPITAL_COMMUNITY): Payer: Self-pay

## 2022-03-28 DIAGNOSIS — E782 Mixed hyperlipidemia: Secondary | ICD-10-CM

## 2022-03-28 DIAGNOSIS — I200 Unstable angina: Secondary | ICD-10-CM

## 2022-03-28 DIAGNOSIS — J449 Chronic obstructive pulmonary disease, unspecified: Secondary | ICD-10-CM | POA: Diagnosis not present

## 2022-03-28 DIAGNOSIS — I4811 Longstanding persistent atrial fibrillation: Secondary | ICD-10-CM | POA: Diagnosis not present

## 2022-03-28 DIAGNOSIS — I10 Essential (primary) hypertension: Secondary | ICD-10-CM | POA: Diagnosis not present

## 2022-03-28 LAB — CBC
HCT: 27.9 % — ABNORMAL LOW (ref 39.0–52.0)
Hemoglobin: 10.1 g/dL — ABNORMAL LOW (ref 13.0–17.0)
MCH: 35.3 pg — ABNORMAL HIGH (ref 26.0–34.0)
MCHC: 36.2 g/dL — ABNORMAL HIGH (ref 30.0–36.0)
MCV: 97.6 fL (ref 80.0–100.0)
Platelets: 135 10*3/uL — ABNORMAL LOW (ref 150–400)
RBC: 2.86 MIL/uL — ABNORMAL LOW (ref 4.22–5.81)
RDW: 13.7 % (ref 11.5–15.5)
WBC: 5.2 10*3/uL (ref 4.0–10.5)
nRBC: 0 % (ref 0.0–0.2)

## 2022-03-28 LAB — BASIC METABOLIC PANEL
Anion gap: 7 (ref 5–15)
BUN: 14 mg/dL (ref 8–23)
CO2: 23 mmol/L (ref 22–32)
Calcium: 7.8 mg/dL — ABNORMAL LOW (ref 8.9–10.3)
Chloride: 109 mmol/L (ref 98–111)
Creatinine, Ser: 1.21 mg/dL (ref 0.61–1.24)
GFR, Estimated: >60 mL/min (ref >60)
Glucose, Bld: 84 mg/dL (ref 70–99)
Potassium: 3.6 mmol/L (ref 3.5–5.1)
Sodium: 139 mmol/L (ref 135–145)

## 2022-03-28 LAB — PROTIME-INR
INR: 1.2 (ref 0.8–1.2)
Prothrombin Time: 14.8 seconds (ref 11.4–15.2)

## 2022-03-28 MED ORDER — ASPIRIN 81 MG PO CHEW
81.0000 mg | CHEWABLE_TABLET | Freq: Every day | ORAL | 0 refills | Status: AC
  Filled 2022-03-28: qty 30, 30d supply, fill #0

## 2022-03-28 MED ORDER — EMPAGLIFLOZIN 10 MG PO TABS
10.0000 mg | ORAL_TABLET | Freq: Every day | ORAL | Status: DC
  Administered 2022-03-28: 10 mg via ORAL
  Filled 2022-03-28: qty 1

## 2022-03-28 MED ORDER — METOPROLOL SUCCINATE ER 50 MG PO TB24
50.0000 mg | ORAL_TABLET | Freq: Every day | ORAL | 0 refills | Status: DC
  Filled 2022-03-28: qty 30, 30d supply, fill #0

## 2022-03-28 MED ORDER — WARFARIN SODIUM 5 MG PO TABS: 5.0000 mg | ORAL_TABLET | ORAL | Status: DC

## 2022-03-28 MED ORDER — EMPAGLIFLOZIN 10 MG PO TABS
10.0000 mg | ORAL_TABLET | Freq: Every day | ORAL | 0 refills | Status: DC
  Filled 2022-03-28: qty 30, 30d supply, fill #0

## 2022-03-28 MED ORDER — LOSARTAN POTASSIUM 25 MG PO TABS
25.0000 mg | ORAL_TABLET | Freq: Every day | ORAL | 0 refills | Status: DC
  Filled 2022-03-28: qty 30, 30d supply, fill #0

## 2022-03-28 MED ORDER — CLOPIDOGREL BISULFATE 75 MG PO TABS
75.0000 mg | ORAL_TABLET | Freq: Every day | ORAL | 11 refills | Status: DC
  Filled 2022-03-28: qty 30, 30d supply, fill #0

## 2022-03-28 MED ORDER — NICOTINE 14 MG/24HR TD PT24
14.0000 mg | MEDICATED_PATCH | Freq: Every day | TRANSDERMAL | 0 refills | Status: DC
  Filled 2022-03-28: qty 28, 28d supply, fill #0

## 2022-03-28 NOTE — Progress Notes (Signed)
Heart Failure Navigator Progress Note  Assessed for Heart & Vascular TOC clinic readiness.  Patient has a established cardiology appointment on 04/04/22, No HF TOC at this time per Dr. Broadus John.   Navigator available for reassessment of patient.   Earnestine Leys, BSN, Clinical cytogeneticist Only

## 2022-03-28 NOTE — Discharge Summary (Signed)
Physician Discharge Summary  [name] XBJ:[id number] DOB: 06-19-1946 DOA: 03/25/2022  PCP: [name], MD  Admit date: 03/25/2022 Discharge date: 03/28/2022  Time spent: 35 minutes  Recommendations for Outpatient Follow-up:  Cardiology in [place] on 8/28 Please check INR on Friday 8/25, he is on triple therapy with aspirin and Plavix as well, advised to discontinue aspirin in 30 days PCP in 1 week   Discharge Diagnoses:  Principal Problem: Acute systolic CHF CAD COPD Tobacco abuse   Hyperlipidemia   TOBACCO USER   GERD (gastroesophageal reflux disease)   CAD (coronary artery disease)   Essential hypertension   COPD (chronic obstructive pulmonary disease) ([place])   Atrial fibrillation ([place])   Hypokalemia   Discharge Condition: Improved  Diet recommendation: Low sodium, heart healthy  Filed Weights   03/25/22 1906 03/26/22 1550 03/27/22 0300  Weight: 67.6 kg 63.3 kg 64.2 kg    History of present illness:  [name] is a 76 y.o. male with medical history significant of atrial fibrillation-on warfarin, coronary artery disease, COPD, GERD, alcohol use, hypertension, current smoker, and more presents the ED with a chief complaint of chest pain.    Hospital Course:   Chest Pain CAD History of remote CABG x4 in 2001.  Patient presented with atypical chest pain, EKG was unremarkable, high-sensitivity troponins were mildly elevated however was noted to have drop in LVEF with wall motion abnormality on recent outpatient echo -cardiology consulted, transferred from [facility] to [facility] showed 3 vessel occlusive native CAD with patent LIMA to LAD, patent SVG to 1st Diag, patent SVG to OM1 with a 80% focal stenosis in the proximal SVG, and an occluded SVG to RCA (RCA fills by left to right collaterals. LV filling pressures were mildly elevated and he was noted to have mild pulmonary HTN. Cardiac output normal. He underwent successful PCI with DES to SVG to  OM1. -Feels well, denies any symptoms, anxious to go home -Now on triple therapy, recommended to continue aspirin 81 Mg daily for 1 month, Plavix 75 mg daily for 1 year and continue Coumadin to be resumed today, INR subtherapeutic, -Continue Toprol and Lipitor --Follow-up with cardiology 3/08   Acute systolic CHF Essential hypertension Echo in 02/2022 showed LVEF of 40-45% with hypokinesis of the antero-lateral wall, inferior wall, and posterior wall. -Remained euvolemic, diuresed with IV Lasix at [facility] -Continued on Toprol, dose increased, started Jardiance, continued Aldactone -Started low-dose losartan yesterday -Resume Lasix as needed at discharge -Follow-up with cardiology clinic, 8/29   Persistent Atrial Fibrillation Looks like he has been in atrial fibrillation on every EKG since 01/2022. - Rate controlled. - Continue Toprol-XL '50mg'$  daily. - On chronic anticoagulation with Coumadin.  INR subtherapeutic, restarting Coumadin this morning, will increase home regimen to 10 Mg daily for 2 days and then restart usual dosing, advised to recheck INR on Friday 8/25   Hyperlipidemia Lipid panel this admission: Total Cholesterol 116, Triglycerides 108, HDL 43, LDL 51. - Continue Lipitor '80mg'$  daily.  Tobacco abuse COPD -Smoking cessation counseled, nicotine patch prescribed  Procedures: Right/Left Cardiac Catheterization 03/27/2022:   Ost LAD to Prox LAD lesion is 100% stenosed.   Mid LAD lesion is 100% stenosed.   Prox Cx lesion is 100% stenosed.   Prox RCA to Mid RCA lesion is 80% stenosed.   Mid RCA lesion is 100% stenosed.   Origin to Prox Graft lesion is 80% stenosed.   Origin to Prox Graft lesion is 100% stenosed.   A drug-eluting  stent was successfully placed using a SYNERGY XD 4.0X12.   Post intervention, there is a 0% residual stenosis.   LIMA graft was visualized by angiography and is normal in caliber.   SVG graft was visualized by angiography and is normal in caliber.    SVG graft was visualized by angiography and is normal in caliber.   SVG graft was visualized by angiography.   The graft exhibits no disease.   The graft exhibits no disease.   LV end diastolic pressure is mildly elevated.   Hemodynamic findings consistent with mild pulmonary hypertension.   3 vessel occlusive CAD Patent LIMA to the LAD Patent SVG to the first diagonal Patent SVG to OM1 with 80% focal stenosis in the proximal SVG Occluded SVG to RCA. The RCA fills by left to right collaterals.  Mildly elevated LV filling pressures.  Mild pulmonary HTN.  Normal cardiac output 4.51 L/min with index 2.53.  Successful PCI of the SVG to OM1 with DES   Plan: DAPT with ASA for one month and Plavix for 12 months. Can resume oral anticoagulation in am if no bleeding noted.  Consultations: Cardiology  Discharge Exam: Vitals:   03/28/22 0635 03/28/22 0858  BP: 129/71 (!) 144/99  Pulse: 79 84  Resp: 18   Temp: 98 F (36.7 C)   SpO2: 96%    Gen: Awake, Alert, Oriented X 3,  HEENT: no JVD Lungs: Good air movement bilaterally, CTAB CVS: S1S2/RRR Abd: soft, Non tender, non distended, BS present Extremities: No edema Skin: no new rashes on exposed skin   Discharge Instructions   Discharge Instructions     Amb Referral to Cardiac Rehabilitation   Complete by: As directed    Diagnosis: Coronary Stents   After initial evaluation and assessments completed: Virtual Based Care may be provided alone or in conjunction with Phase 2 Cardiac Rehab based on patient barriers.: Yes   Diet - low sodium heart healthy   Complete by: As directed    Discharge instructions   Complete by: As directed    Please get INR checked on Friday 8/25   Increase activity slowly   Complete by: As directed       Allergies as of 03/28/2022   No Known Allergies      Medication List     TAKE these medications    acetaminophen 500 MG tablet Commonly known as: TYLENOL Take 2 tablets (1,000 mg total) by  mouth every 6 (six) hours as needed for moderate pain or headache.   albuterol 108 (90 Base) MCG/ACT inhaler Commonly known as: Ventolin HFA Inhale 2 puffs into the lungs every 6 (six) hours as needed for wheezing or shortness of breath.   allopurinol 100 MG tablet Commonly known as: ZYLOPRIM Take 1 tablet (100 mg total) by mouth daily.   aspirin 81 MG chewable tablet Chew 1 tablet (81 mg total) by mouth daily. Stop this after 30 days Start taking on: March 29, 2022   atorvastatin 80 MG tablet Commonly known as: LIPITOR TAKE 1 TABLET EVERY DAY AT 6 PM What changed:  how much to take how to take this when to take this additional instructions   Breztri Aerosphere 160-9-4.8 MCG/ACT Aero Generic drug: Budeson-Glycopyrrol-Formoterol Take 2 puffs by mouth in the morning and at bedtime. Dispense 3 inhalers   clopidogrel 75 MG tablet Commonly known as: PLAVIX Take 1 tablet (75 mg total) by mouth daily. Continue this for 1 year Start taking on: March 29, 2022   empagliflozin  10 MG Tabs tablet Commonly known as: JARDIANCE Take 1 tablet (10 mg total) by mouth daily. Start taking on: March 29, 2022   fluticasone 50 MCG/ACT nasal spray Commonly known as: FLONASE Place 2 sprays into both nostrils daily as needed for allergies.   furosemide 20 MG tablet Commonly known as: LASIX Take 1 tablet (20 mg total) by mouth as needed (swelling).   losartan 25 MG tablet Commonly known as: COZAAR Take 1 tablet (25 mg total) by mouth daily. Start taking on: March 29, 2022   metoprolol succinate 50 MG 24 hr tablet Commonly known as: Toprol XL Take 1 tablet (50 mg total) by mouth daily. What changed:  medication strength how much to take   nicotine 14 mg/24hr patch Commonly known as: NICODERM CQ - dosed in mg/24 hours Place 1 patch (14 mg total) onto the skin daily. Start taking on: March 29, 2022   nitroGLYCERIN 0.4 MG SL tablet Commonly known as: NITROSTAT Place 1 tablet (0.4  mg total) under the tongue every 5 (five) minutes as needed for chest pain.   pantoprazole 40 MG tablet Commonly known as: PROTONIX Take 1 tablet (40 mg total) by mouth daily.   spironolactone 25 MG tablet Commonly known as: Aldactone Take 0.5 tablets (12.5 mg total) by mouth daily.   VITAMIN B COMPLEX PO Take 1 tablet by mouth daily.   Vitamin D3 125 MCG (5000 UT) Caps Take 5,000 Units by mouth daily.   warfarin 5 MG tablet Commonly known as: COUMADIN Take as directed. If you are unsure how to take this medication, talk to your nurse or doctor. Original instructions: Take 1-2 tablets (5-10 mg total) by mouth See admin instructions. Take '10mg'$  for 2days then resume usual regimen of 5 mg on Monday, 2.5 mg all other days Please get INR checked on Friday 8/25 What changed:  how much to take when to take this additional instructions       No Known Allergies  Follow-up Information     Furth, Cadence H, PA-C Follow up.   Specialty: Cardiology Why: Hospital follow-up with Cardiology scheduled for 04/04/2022 at 3:30pm in our [place] office. Please arrive 15 minutes early for check-in. If this date/time does not work for you, please call our office to reschedule. Contact information: [address] [place] [address] [phone]         [name], MD. Schedule an appointment as soon as possible for a visit in 1 week(s).   Specialty: Family Medicine Contact information: [place] [address] [phone]         Arnoldo Lenis, MD .   Specialty: Cardiology Contact information: [place] [address] [phone]                  The results of significant diagnostics from this hospitalization (including imaging, microbiology, ancillary and laboratory) are listed below for reference.    Significant Diagnostic Studies: CARDIAC CATHETERIZATION  Result Date: 03/27/2022   Ost LAD to Prox LAD lesion is  100% stenosed.   Mid LAD lesion is 100% stenosed.   Prox Cx lesion is 100% stenosed.   Prox RCA to Mid RCA lesion is 80% stenosed.   Mid RCA lesion is 100% stenosed.   Origin to Prox Graft lesion is 80% stenosed.   Origin to Prox Graft lesion is 100% stenosed.   A drug-eluting stent was successfully placed using a SYNERGY XD 4.0X12.   Post intervention,  there is a 0% residual stenosis.   LIMA graft was visualized by angiography and is normal in caliber.   SVG graft was visualized by angiography and is normal in caliber.   SVG graft was visualized by angiography and is normal in caliber.   SVG graft was visualized by angiography.   The graft exhibits no disease.   The graft exhibits no disease.   LV end diastolic pressure is mildly elevated.   Hemodynamic findings consistent with mild pulmonary hypertension. 3 vessel occlusive CAD Patent LIMA to the LAD Patent SVG to the first diagonal Patent SVG to OM1 with 80% focal stenosis in the proximal SVG Occluded SVG to RCA. The RCA fills by left to right collaterals. Mildly elevated LV filling pressures. Mild pulmonary HTN. Normal cardiac output 4.51 L/min with index 2.53. Successful PCI of the SVG to OM1 with DES Plan: DAPT with ASA for one month and Plavix for 12 months. Can resume oral anticoagulation in am if no bleeding noted.   DG Chest Port 1 View  Result Date: 03/25/2022 CLINICAL DATA:  Sternal chest pain and left-sided chest pain EXAM: PORTABLE CHEST 1 VIEW COMPARISON:  01/17/2017 FINDINGS: Status post median sternotomy, internal mammary artery dissection, and CABG. No evidence of sternal wire fracture on this single view. Cardiac and mediastinal contours are within normal limits given AP technique. Hyperinflated lungs. No focal pulmonary opacity. No pleural effusion or pneumothorax. No acute osseous abnormality. IMPRESSION: No acute cardiopulmonary process. Electronically Signed   By: Merilyn Baba M.D.   On: 03/25/2022 19:29    Microbiology: Recent Results  (from the past 240 hour(s))  MRSA Next Gen by PCR, Nasal     Status: None   Collection Time: 03/26/22  3:27 PM   Specimen: Nasal Mucosa; Nasal Swab  Result Value Ref Range Status   MRSA by PCR Next Gen NOT DETECTED NOT DETECTED Final    Comment: (NOTE) The GeneXpert MRSA Assay (FDA approved for NASAL specimens only), is one component of a comprehensive MRSA colonization surveillance program. It is not intended to diagnose MRSA infection nor to guide or monitor treatment for MRSA infections. Test performance is not FDA approved in patients less than 41 years old. Performed at [facility], [address] [place], [place] [address]      Labs: Basic Metabolic Panel: Recent Labs  Lab 03/25/22 1909 03/26/22 0514 03/27/22 0408 03/27/22 1436 03/27/22 1437 03/27/22 1445 03/28/22 0340  NA 141 139 140 140 141 139 139  K 3.1* 3.5 3.5 4.5 4.5 4.4 3.6  CL 101 104 108  --   --   --  109  CO2 '31 28 26  '$ --   --   --  23  GLUCOSE 129* 100* 91  --   --   --  84  BUN 7* 8 12  --   --   --  14  CREATININE 1.08 0.95 0.92  --   --   --  1.21  CALCIUM 9.4 8.8* 8.3*  --   --   --  7.8*  MG 1.1* 1.7  --   --   --   --   --    Liver Function Tests: Recent Labs  Lab 03/25/22 1909 03/26/22 0514  AST 37 29  ALT 25 22  ALKPHOS 107 92  BILITOT 1.1 1.1  PROT 7.7 6.5  ALBUMIN 3.9 3.3*   No results for input(s): "LIPASE", "AMYLASE" in the last 168 hours. No results for input(s): "AMMONIA" in  the last 168 hours. CBC: Recent Labs  Lab 03/25/22 1909 03/26/22 0514 03/27/22 1436 03/27/22 1437 03/27/22 1445 03/28/22 0340  WBC 8.0 6.2  --   --   --  5.2  NEUTROABS 5.6  --   --   --   --   --   HGB 13.1 11.7* 10.9* 10.9* 10.2* 10.1*  HCT 37.0* 33.8* 32.0* 32.0* 30.0* 27.9*  MCV 96.9 98.3  --   --   --  97.6  PLT 193 175  --   --   --  135*   Cardiac Enzymes: No results for input(s): "CKTOTAL", "CKMB", "CKMBINDEX", "TROPONINI" in the last 168 hours. BNP: BNP (last 3 results) Recent Labs     11/28/21 1345  BNP 146.4*    ProBNP (last 3 results) No results for input(s): "PROBNP" in the last 8760 hours.  CBG: No results for input(s): "GLUCAP" in the last 168 hours.     Signed:  Domenic Polite MD.  [facility] 03/28/2022, 2:09 PM

## 2022-03-28 NOTE — Progress Notes (Signed)
Heart Failure Stewardship Pharmacist Progress Note   PCP: Stacks, Warren, MD PCP-Cardiologist: Branch, Jonathan, MD   Met with patient to discuss patient assistance options for HF medications. Provided patient with [employer] patient assistance application for Jardiance to lower copay to $0. Educated that if he is started on Entresto during his outpatient follow up, to ask for assistance for this as well. Patient will bring completed application to [facility] appt on 8/29 for provider completion.  Patient has been educated on current HF medications and potential additions to HF medication regimen.  Patient verbalizes understanding that over the next few months, these medication doses may change and more medications may be added to optimize HF regimen.  Patient has been educated on basic disease state pathophysiology and goals of therapy.  Megan Boothby, PharmD, BCPS Heart Failure Stewardship Pharmacist Phone [phone]   

## 2022-03-28 NOTE — Progress Notes (Signed)
CARDIAC REHAB PHASE I   PRE:  Rate/Rhythm: 84 A-fib   BP:  Sitting: 129/71      SaO2: 96 RA  MODE:  Ambulation: 120 ft   POST:  Rate/Rhythm: 108 A-fib  BP:  Sitting: 144/99      SaO2: 97 RA  Pt ambulated in hall with no CP or SOB. Back to room to bed with call bell and bedside table in reach. Post stent education including site care, restrictions, heart healthy diet, antiplatelet therapy importance, risk factors, exercise guidelines, smoking cessation, and CRP2. Will refer to AP for CRP2. All questions and concerns addressed. Pt plan is  for home today.  [id number]  Vanessa Barbara, RN BSN 03/28/2022 9:31 AM

## 2022-03-28 NOTE — Discharge Instructions (Addendum)
Medication Changes: - START Aspirin '81mg'$  daily and Plavix '75mg'$  daily in addition to your Coumadin. You will continue all 3 for 1 month. After 1 full month, you can stop the Aspirin but continue the Plavix and Coumadin. - INCREASE Metoprolol succinate (Toprol-XL) to '50mg'$  daily. - START Losartan '25mg'$  daily. - START Jardiance '10mg'$  daily. - CONTINUE Spironolactone 12.'5mg'$  daily.  Post Cardiac Catheterization: NO HEAVY LIFTING OR SEXUAL ACTIVITY X 7 DAYS. NO DRIVING X 3-5 DAYS. NO SOAKING BATHS, HOT TUBS, POOLS, ETC., X 7 DAYS.  Groin Site Care: Refer to this sheet in the next few weeks. These instructions provide you with information on caring for yourself after your procedure. Your caregiver may also give you more specific instructions. Your treatment has been planned according to current medical practices, but problems sometimes occur. Call your caregiver if you have any problems or questions after your procedure. HOME CARE INSTRUCTIONS You may shower 24 hours after the procedure. Remove the bandage (dressing) and gently wash the site with plain soap and water. Gently pat the site dry.  Do not apply powder or lotion to the site.  Do not sit in a bathtub, swimming pool, or whirlpool for 5 to 7 days.  No bending, squatting, or lifting anything over 10 pounds (4.5 kg) as directed by your caregiver.  Inspect the site at least twice daily.  Do not drive home if you are discharged the same day of the procedure. Have someone else drive you.  What to expect: Any bruising will usually fade within 1 to 2 weeks.  Blood that collects in the tissue (hematoma) may be painful to the touch. It should usually decrease in size and tenderness within 1 to 2 weeks.  SEEK IMMEDIATE MEDICAL CARE IF: You have unusual pain at the groin site or down the affected leg.  You have redness, warmth, swelling, or pain at the groin site.  You have drainage (other than a small amount of blood on the dressing).  You have  chills.  You have a fever or persistent symptoms for more than 72 hours.  You have a fever and your symptoms suddenly get worse.  Your leg becomes pale, cool, tingly, or numb.  You have heavy bleeding from the site. Hold pressure on the site.

## 2022-03-28 NOTE — Progress Notes (Addendum)
Progress Note  Patient Name: [name] Date of Encounter: 03/28/2022  [facility] Cardiologist: [name], MD   Subjective   No acute overnight events. Patient is doing well this morning and is eager to go home. He denies any recurrent chest pain. He has chronic dyspnea on exertion but this is stable.  Inpatient Medications    Scheduled Meds:  aspirin  81 mg Oral Daily   atorvastatin  80 mg Oral Daily   Chlorhexidine Gluconate Cloth  6 each Topical Daily   clopidogrel  75 mg Oral Daily   losartan  25 mg Oral Daily   metoprolol succinate  50 mg Oral Daily   mometasone-formoterol  2 puff Inhalation BID   nicotine  14 mg Transdermal Daily   pantoprazole  40 mg Oral Daily   sodium chloride flush  3 mL Intravenous Q12H   spironolactone  12.5 mg Oral Daily   umeclidinium bromide  1 puff Inhalation Daily   Continuous Infusions:  sodium chloride Stopped (03/28/22 0200)   sodium chloride     PRN Meds: sodium chloride, acetaminophen, albuterol, nitroGLYCERIN, ondansetron (ZOFRAN) IV, mouth rinse, sodium chloride flush   Vital Signs    Vitals:   03/27/22 1750 03/27/22 1813 03/27/22 2054 03/28/22 0635  BP: 127/77 (!) 146/105 115/70 129/71  Pulse: 89 94 88 79  Resp: (!) '22 19 18 18  '$ Temp:   98.5 F (36.9 C) 98 F (36.7 C)  TempSrc:   Oral Oral  SpO2: 98% 98% 98% 96%  Weight:      Height:        Intake/Output Summary (Last 24 hours) at 03/28/2022 0736 Last data filed at 03/28/2022 0730 Gross per 24 hour  Intake 483.57 ml  Output 350 ml  Net 133.57 ml      03/27/2022    3:00 AM 03/26/2022    3:50 PM 03/25/2022    7:06 PM  Last 3 Weights  Weight (lbs) 141 lb 8.6 oz 139 lb 8.8 oz 149 lb  Weight (kg) 64.2 kg 63.3 kg 67.586 kg      Telemetry    Atrial fibrillation with rates in the 70s to 80s. - Personally Reviewed  ECG    Atrial fibrillation, rate 75 bpm, with non-specific ST/T changes. - Personally Reviewed  Physical Exam   GEN: No acute  distress.   Neck: No JVD. Cardiac: Irregularly irregular rhythm with normal rate. No murmurs, rubs, or gallops. Right femoral cath site soft with no bruits or signs of hematoma.  Respiratory: Clear to auscultation bilaterally. No wheezes, rhonchi, or rales. GI: Soft, non-distended, and non-tender.  MS: No lower extremity edema. No deformity.  Skin: Warm and dry. Neuro:  No focal deficits. Psych: Normal affect. Responds appropriately.  Labs    High Sensitivity Troponin:   Recent Labs  Lab 03/25/22 1909 03/25/22 2118 03/26/22 0514  TROPONINIHS 21* 20* 17     Chemistry Recent Labs  Lab 03/25/22 1909 03/26/22 0514 03/27/22 0408 03/27/22 1436 03/27/22 1437 03/27/22 1445 03/28/22 0340  NA 141 139 140   < > 141 139 139  K 3.1* 3.5 3.5   < > 4.5 4.4 3.6  CL 101 104 108  --   --   --  109  CO2 '31 28 26  '$ --   --   --  23  GLUCOSE 129* 100* 91  --   --   --  84  BUN 7* 8 12  --   --   --  14  CREATININE 1.08 0.95 0.92  --   --   --  1.21  CALCIUM 9.4 8.8* 8.3*  --   --   --  7.8*  MG 1.1* 1.7  --   --   --   --   --   PROT 7.7 6.5  --   --   --   --   --   ALBUMIN 3.9 3.3*  --   --   --   --   --   AST 37 29  --   --   --   --   --   ALT 25 22  --   --   --   --   --   ALKPHOS 107 92  --   --   --   --   --   BILITOT 1.1 1.1  --   --   --   --   --   GFRNONAA >60 >60 >60  --   --   --  >60  ANIONGAP '9 7 6  '$ --   --   --  7   < > = values in this interval not displayed.    Lipids  Recent Labs  Lab 03/26/22 0514  CHOL 116  TRIG 108  HDL 43  LDLCALC 51  CHOLHDL 2.7    Hematology Recent Labs  Lab 03/25/22 1909 03/26/22 0514 03/27/22 1436 03/27/22 1437 03/27/22 1445 03/28/22 0340  WBC 8.0 6.2  --   --   --  5.2  RBC 3.82* 3.44*  --   --   --  2.86*  HGB 13.1 11.7*   < > 10.9* 10.2* 10.1*  HCT 37.0* 33.8*   < > 32.0* 30.0* 27.9*  MCV 96.9 98.3  --   --   --  97.6  MCH 34.3* 34.0  --   --   --  35.3*  MCHC 35.4 34.6  --   --   --  36.2*  RDW 13.9 13.9  --   --    --  13.7  PLT 193 175  --   --   --  135*   < > = values in this interval not displayed.   Thyroid No results for input(s): "TSH", "FREET4" in the last 168 hours.  BNPNo results for input(s): "BNP", "PROBNP" in the last 168 hours.  DDimer No results for input(s): "DDIMER" in the last 168 hours.   Radiology    CARDIAC CATHETERIZATION  Result Date: 03/27/2022   Ost LAD to Prox LAD lesion is 100% stenosed.   Mid LAD lesion is 100% stenosed.   Prox Cx lesion is 100% stenosed.   Prox RCA to Mid RCA lesion is 80% stenosed.   Mid RCA lesion is 100% stenosed.   Origin to Prox Graft lesion is 80% stenosed.   Origin to Prox Graft lesion is 100% stenosed.   A drug-eluting stent was successfully placed using a SYNERGY XD 4.0X12.   Post intervention, there is a 0% residual stenosis.   LIMA graft was visualized by angiography and is normal in caliber.   SVG graft was visualized by angiography and is normal in caliber.   SVG graft was visualized by angiography and is normal in caliber.   SVG graft was visualized by angiography.   The graft exhibits no disease.   The graft exhibits no disease.   LV end diastolic pressure is mildly elevated.   Hemodynamic findings consistent with mild pulmonary  hypertension. 3 vessel occlusive CAD Patent LIMA to the LAD Patent SVG to the first diagonal Patent SVG to OM1 with 80% focal stenosis in the proximal SVG Occluded SVG to RCA. The RCA fills by left to right collaterals. Mildly elevated LV filling pressures. Mild pulmonary HTN. Normal cardiac output 4.51 L/min with index 2.53. Successful PCI of the SVG to OM1 with DES Plan: DAPT with ASA for one month and Plavix for 12 months. Can resume oral anticoagulation in am if no bleeding noted.    Cardiac Studies   Echocardiogram 02/09/2022: Impressions:  1. Left ventricular ejection fraction, by estimation, is 40 to 45%. The  left ventricle has mildly decreased function. The left ventricle  demonstrates regional wall motion  abnormalities (see scoring  diagram/findings for description). There is mild  asymmetric left ventricular hypertrophy of the basal segment. Left  ventricular diastolic parameters are indeterminate. The average left  ventricular global longitudinal strain is -10.0 %. The global longitudinal  strain is abnormal.   2. Right ventricular systolic function is mildly reduced. The right  ventricular size is normal. There is normal pulmonary artery systolic  pressure. The estimated right ventricular systolic pressure is 70.3 mmHg.   3. Left atrial size was mildly dilated.   4. The mitral valve is grossly normal. Mild to moderate mitral valve  regurgitation.   5. Tricuspid valve regurgitation is mild to moderate.   6. The aortic valve is tricuspid. Aortic valve regurgitation is not  visualized.   7. The inferior vena cava is normal in size with greater than 50%  respiratory variability, suggesting right atrial pressure of 3 mmHg.   Comparison(s): Prior images reviewed side by side. LVEF has decreased in  comparison to prior study.   _______________  Right/Left Cardiac Catheterization 03/27/2022:   Ost LAD to Prox LAD lesion is 100% stenosed.   Mid LAD lesion is 100% stenosed.   Prox Cx lesion is 100% stenosed.   Prox RCA to Mid RCA lesion is 80% stenosed.   Mid RCA lesion is 100% stenosed.   Origin to Prox Graft lesion is 80% stenosed.   Origin to Prox Graft lesion is 100% stenosed.   A drug-eluting stent was successfully placed using a SYNERGY XD 4.0X12.   Post intervention, there is a 0% residual stenosis.   LIMA graft was visualized by angiography and is normal in caliber.   SVG graft was visualized by angiography and is normal in caliber.   SVG graft was visualized by angiography and is normal in caliber.   SVG graft was visualized by angiography.   The graft exhibits no disease.   The graft exhibits no disease.   LV end diastolic pressure is mildly elevated.   Hemodynamic findings  consistent with mild pulmonary hypertension.   3 vessel occlusive CAD Patent LIMA to the LAD Patent SVG to the first diagonal Patent SVG to OM1 with 80% focal stenosis in the proximal SVG Occluded SVG to RCA. The RCA fills by left to right collaterals.  Mildly elevated LV filling pressures.  Mild pulmonary HTN.  Normal cardiac output 4.51 L/min with index 2.53.  Successful PCI of the SVG to OM1 with DES   Plan: DAPT with ASA for one month and Plavix for 12 months. Can resume oral anticoagulation in am if no bleeding noted.   Diagnostic Dominance: Right  Intervention     Patient Profile     76 y.o. male with a history of CAD s/p remote CABG x4 in 2001 with  patent grafts in 6301, new systolic CHF with EF of 60-10% on Echo in 02/2022, persistent atrial fibrillation on Coumadin, hypertension, hyperlipidemia, COPD, and bladder/prostate cancer who presented to [facility] on 03[facility]25/2022 with atypical chest pain. High-sensitivity troponin minimally elevated and flat not consistent with ACS. However, given mild drop in LVEF and regional wall motion abnormalities noted on recent outpatient Echo last month, decision was made to transfer patient to [facility] for cardiac catheterization.  Assessment & Plan    Chest Pain CAD History of remote CABG x4 in 2001. Patient presented with atypical chest pain. EKG showed no acute ischemic changes. High-sensitivity High-sensitivity troponin minimally elevated and flat not consistent with ACS. However, given mild drop in LVEF and regional wall motion abnormalities noted on recent outpatient Echo last month, decision made to proceed with cardiac catheterization. R/LHC showed 3 vessel occlusive native CAD with patent LIMA to LAD, patent SVG to 1st Diag, patent SVG to OM1 with a 80% focal stenosis in the proximal SVG, and an occluded SVG to RCA (RCA fills by left to right collaterals. LV filling pressures were mildly elevated and he was noted to have mild  pulmonary HTN. Cardiac output normal. He underwent successful PCI with DES to SVG to OM1. - Doing well this morning. No recurrent chest pain. - Continue triple therapy with Aspirin '81mg'$  daily, Plavix '75mg'$  daily, and Coumadin for 1 month at which time Aspirin can be stopped. Plavix should be continued for at least 12 months. - Continue Toprol-XL '50mg'$  daily and Lipitor '80mg'$  daily.  Chronic Systolic CHF Echo in 04/3234 showed LVEF of 40-45% with hypokinesis of the antero-lateral wall, inferior wall, and posterior wall. - Euvolemic on exam.  - Previously had hyperkalemia on ACEi. Started on Losartan '25mg'$  daily yesterday and tolerating well so far. Potassium stable. - Continue Toprol-XL '50mg'$  daily. - Continue Spironolactone 12.'5mg'$  daily. - Started on Jardiance '10mg'$  daily today. - Will need repeat BMET at follow-up visit.  Persistent Atrial Fibrillation Looks like he has been in atrial fibrillation on every EKG since 01/2022. - Rate controlled. - Continue Toprol-XL '50mg'$  daily. - On chronic anticoagulation with Coumadin. Can restart this morning. Will defer dosing to Pharmacy. Patient already has an appointment in Coumadin Clinic scheduled for 04/05/2022 so we can recheck INR at that time.  Hypertension BP initially elevated but well controlled this morning. - Continue medications for CHF as above.  Hyperlipidemia Lipid panel this admission: Total Cholesterol 116, Triglycerides 108, HDL 43, LDL 51. - Continue Lipitor '80mg'$  daily.  Disposition: Patient is stable from a cardiac standpoint and is eager to go. Dr. Gwenlyn Found to see later this morning but this should be fine. Patient also needs to be seen by Cardiac Rehab prior to discharge. Follow-up visit has been arranged.  For questions or updates, please contact [place] Please consult [email or web address] for contact info under        Signed, Darreld Mclean, PA-C  03/28/2022, 7:36 AM     Agree with note by Sande Rives,  PA-C  76 year old patient of Dr. [name] is transferred from [facility] for diagnostic coronary angiography.  History of remote CABG as well as chronic systolic heart failure, persistent A-fib, hypertension hyperlipidemia.  He had a new decrease in EF by 2D echo and a wall motion abnormality along with symptoms.  He underwent right left heart cath by Dr. Martinique through the femoral approach yesterday revealing a high-grade focal proximal stenosis in the SVG to an obtuse marginal branch.  All 3 native vessels were occluded.  The vein to the RCA was occluded as well.  The LIMA to the LAD was patent as was the vein to the diagonal branch.  He underwent successful direct stenting to the circumflex obtuse marginal branch SVG with a 4 mm stent with excellent result.  He is pain-free today.  His groin is stable.  Okay for discharge on "triple therapy" including Coumadin, aspirin and clopidogrel.  Can discontinue aspirin 30 days.  Patient can discuss the possibility of transitioning from Coumadin to a DOAC with his primary cardiologist as an outpatient.   Lorretta Harp, M.D., [place], [facility], [place] [address]. [place], [place]  [id number]  [phone] 03/28/2022 9:32 AM

## 2022-03-28 NOTE — Plan of Care (Signed)
  Problem: Education: Goal: Understanding of cardiac disease, CV risk reduction, and recovery process will improve Outcome: Adequate for Discharge Goal: Individualized Educational Video(s) Outcome: Adequate for Discharge   Problem: Activity: Goal: Ability to tolerate increased activity will improve Outcome: Adequate for Discharge   Problem: Cardiac: Goal: Ability to achieve and maintain adequate cardiovascular perfusion will improve Outcome: Adequate for Discharge   Problem: Health Behavior/Discharge Planning: Goal: Ability to safely manage health-related needs after discharge will improve Outcome: Adequate for Discharge   Problem: Education: Goal: Knowledge of General Education information will improve Description: Including pain rating scale, medication(s)/side effects and non-pharmacologic comfort measures Outcome: Adequate for Discharge   Problem: Health Behavior/Discharge Planning: Goal: Ability to manage health-related needs will improve Outcome: Adequate for Discharge   Problem: Clinical Measurements: Goal: Ability to maintain clinical measurements within normal limits will improve Outcome: Adequate for Discharge Goal: Will remain free from infection Outcome: Adequate for Discharge Goal: Diagnostic test results will improve Outcome: Adequate for Discharge Goal: Respiratory complications will improve Outcome: Adequate for Discharge Goal: Cardiovascular complication will be avoided Outcome: Adequate for Discharge   Problem: Activity: Goal: Risk for activity intolerance will decrease Outcome: Adequate for Discharge   Problem: Nutrition: Goal: Adequate nutrition will be maintained Outcome: Adequate for Discharge   Problem: Coping: Goal: Level of anxiety will decrease Outcome: Adequate for Discharge   Problem: Elimination: Goal: Will not experience complications related to bowel motility Outcome: Adequate for Discharge Goal: Will not experience complications  related to urinary retention Outcome: Adequate for Discharge   Problem: Pain Managment: Goal: General experience of comfort will improve Outcome: Adequate for Discharge   Problem: Safety: Goal: Ability to remain free from injury will improve Outcome: Adequate for Discharge   Problem: Skin Integrity: Goal: Risk for impaired skin integrity will decrease Outcome: Adequate for Discharge   Problem: Education: Goal: Understanding of CV disease, CV risk reduction, and recovery process will improve Outcome: Adequate for Discharge Goal: Individualized Educational Video(s) Outcome: Adequate for Discharge   Problem: Activity: Goal: Ability to return to baseline activity level will improve Outcome: Adequate for Discharge   Problem: Cardiovascular: Goal: Ability to achieve and maintain adequate cardiovascular perfusion will improve Outcome: Adequate for Discharge Goal: Vascular access site(s) Level 0-1 will be maintained Outcome: Adequate for Discharge   Problem: Health Behavior/Discharge Planning: Goal: Ability to safely manage health-related needs after discharge will improve Outcome: Adequate for Discharge

## 2022-03-28 NOTE — Telephone Encounter (Signed)
Pharmacy Patient Advocate Encounter  Insurance verification completed.    The patient is insured through [employer]   The patient is currently admitted and ran test claims for the following: Farxiga, Jardiance, Entresto.  Copays and coinsurance results were relayed to Inpatient clinical team.

## 2022-03-28 NOTE — TOC Benefit Eligibility Note (Signed)
Patient Teacher, English as a foreign language completed.    The patient is currently admitted and upon discharge could be taking Entresto 24-26 mg.  The current 30 day co-pay is $45.00.   The patient is currently admitted and upon discharge could be taking Jardiance 10 mg.  The current 30 day co-pay is $45.00.   The patient is currently admitted and upon discharge could be taking Farxiga 10 mg.  The current 30 day co-pay is $95.00.   The patient is insured through [place], [place] Patient Advocate Specialist Deputy Patient Advocate Team Direct Number: [phone]  Fax: [phone]

## 2022-03-29 LAB — LIPOPROTEIN A (LPA): Lipoprotein (a): 46.5 nmol/L — ABNORMAL HIGH (ref <75.0)

## 2022-03-30 ENCOUNTER — Telehealth: Payer: Self-pay | Admitting: Family Medicine

## 2022-03-30 ENCOUNTER — Ambulatory Visit (INDEPENDENT_AMBULATORY_CARE_PROVIDER_SITE_OTHER): Payer: Medicare HMO | Admitting: Family Medicine

## 2022-03-30 ENCOUNTER — Encounter: Payer: Self-pay | Admitting: Family Medicine

## 2022-03-30 VITALS — BP 134/78 | HR 77 | Temp 97.3°F | Ht 69.0 in | Wt 145.2 lb

## 2022-03-30 DIAGNOSIS — I5021 Acute systolic (congestive) heart failure: Secondary | ICD-10-CM | POA: Diagnosis not present

## 2022-03-30 DIAGNOSIS — I4811 Longstanding persistent atrial fibrillation: Secondary | ICD-10-CM | POA: Diagnosis not present

## 2022-03-30 DIAGNOSIS — Z7901 Long term (current) use of anticoagulants: Secondary | ICD-10-CM | POA: Diagnosis not present

## 2022-03-30 DIAGNOSIS — Z5181 Encounter for therapeutic drug level monitoring: Secondary | ICD-10-CM | POA: Diagnosis not present

## 2022-03-30 LAB — PROTIME-INR: INR: 1.3 — AB (ref 0.80–1.20)

## 2022-03-30 NOTE — Progress Notes (Signed)
Subjective:  [name], male    DOB: [birth year]  Age: 76 y.o. MRN: [id number]  CC: Hospitalization Follow-up   HPI [name] presents for Transition of care .Hospitalized 8/19 to 7/67 for acute systolic heart failure. Also has A. Fib and CAD,  S/P CABG in 2001. Feeling better now after [place]. Now having much less DOE. Wearing nicotine patch to help him stop smoking.      03/30/2022    4:07 PM 03/30/2022    4:01 PM 11/28/2021    1:14 PM  Depression screen PHQ 2/9  Decreased Interest 2 0 0  Down, Depressed, Hopeless 2 0 0  PHQ - 2 Score 4 0 0  Altered sleeping 3  0  Tired, decreased energy 3  0  Change in appetite 0  2  Feeling bad or failure about yourself  0  0  Trouble concentrating 0  0  Moving slowly or fidgety/restless 0  0  Suicidal thoughts 0  0  PHQ-9 Score 10  2  Difficult doing work/chores Not difficult at all  Not difficult at all    History [name] has a past medical history of Anginal pain ([place]), Atrial fibrillation by electrocardiogram ([place]) (11/2019), Atrial flutter by electrocardiogram ([place]) (11/2019), Bladder cancer ([place]) (01/2020), Bladder tumor, BPH (benign prostatic hyperplasia), CAD (coronary artery disease), COPD (chronic obstructive pulmonary disease) ([place]), Dyspnea, ETOH abuse, GERD (gastroesophageal reflux disease), History of gout, Hypercholesterolemia, Hypertension, Myocardial infarction ([place]) (2013), Prostate cancer ([place]), and Tobacco abuse.   He has a past surgical history that includes Prostate surgery; Colonoscopy; Back surgery; Esophagogastroduodenoscopy (05/27/2012); Cardiac catheterization (2011); Coronary angioplasty; Coronary artery bypass graft; Anterior cervical decompression/discectomy fusion 4 level (N/A, 07/21/2016); Colonoscopy (N/A, 11/21/2017); polypectomy (11/21/2017); Transurethral resection of bladder tumor with mitomycin-c (Left, 01/06/2020); Cardioversion (N/A, 06/10/2020); Colonoscopy with propofol (N/A, 06/09/2021);  polypectomy (06/09/2021); RIGHT/LEFT HEART CATH AND CORONARY/GRAFT ANGIOGRAPHY (N/A, 03/27/2022); and CORONARY STENT INTERVENTION (N/A, 03/27/2022).   His family history includes Diabetes in his brother, brother, and mother; Healthy in his son; Heart disease in his brother and brother; Hypertension in his father; Multiple sclerosis in his daughter.He reports that he has been smoking cigarettes and e-cigarettes. He started smoking about 52 years ago. He has a 29.00 pack-year smoking history. He quit smokeless tobacco use about 10 years ago.  His smokeless tobacco use included snuff. He reports current alcohol use of about 2.0 - 4.0 standard drinks of alcohol per week. He reports that he does not use drugs.    ROS Review of Systems  Constitutional: Negative.   HENT: Negative.    Eyes:  Negative for visual disturbance.  Respiratory:  Negative for cough and shortness of breath.   Cardiovascular:  Negative for chest pain and leg swelling.  Gastrointestinal:  Negative for abdominal pain, diarrhea, nausea and vomiting.  Genitourinary:  Negative for difficulty urinating.  Musculoskeletal:  Negative for arthralgias and myalgias.  Skin:  Negative for rash.  Neurological:  Negative for headaches.  Psychiatric/Behavioral:  Negative for sleep disturbance.     Objective:  BP 134/78   Pulse 77   Temp (!) 97.3 F (36.3 C)   Ht '5\' 9"'$  (1.753 m)   Wt 145 lb 3.2 oz (65.9 kg)   SpO2 99%   BMI 21.44 kg/m   BP Readings from Last 3 Encounters:  03/30/22 134/78  03/28/22 (!) 144/99  03/08/22 104/70    Wt Readings from Last 3 Encounters:  03/30/22 145 lb 3.2 oz (65.9 kg)  03/27/22  141 lb 8.6 oz (64.2 kg)  03/08/22 146 lb 3.2 oz (66.3 kg)     Physical Exam Vitals reviewed.  Constitutional:      General: He is not in acute distress.    Appearance: He is well-developed.  HENT:     Head: Normocephalic and atraumatic.     Right Ear: External ear normal.     Left Ear: External ear normal.      Nose: Nose normal.     Mouth/Throat:     Pharynx: No oropharyngeal exudate or posterior oropharyngeal erythema.  Eyes:     Conjunctiva/sclera: Conjunctivae normal.     Pupils: Pupils are equal, round, and reactive to light.  Cardiovascular:     Rate and Rhythm: Normal rate and regular rhythm.     Heart sounds: Normal heart sounds. No murmur heard. Pulmonary:     Effort: Pulmonary effort is normal. No respiratory distress.     Breath sounds: Normal breath sounds. No wheezing or rales.  Abdominal:     Palpations: Abdomen is soft.     Tenderness: There is no abdominal tenderness.  Musculoskeletal:        General: Normal range of motion.     Cervical back: Normal range of motion and neck supple.  Skin:    General: Skin is warm and dry.  Neurological:     Mental Status: He is alert and oriented to person, place, and time.     Deep Tendon Reflexes: Reflexes are normal and symmetric.  Psychiatric:        Behavior: Behavior normal.        Thought Content: Thought content normal.        Judgment: Judgment normal.       Assessment & Plan:   [name] was seen today for hospitalization follow-up.  Diagnoses and all orders for this visit:  Acute systolic heart failure ([place])  Longstanding persistent atrial fibrillation ([place]) -     CoaguChek XS/INR Waived -     Protime-INR  Blood thinned due to long-term anticoagulant use -     Protime-INR  Encounter for therapeutic drug monitoring -     Protime-INR       I am having [name] maintain his Vitamin D3, acetaminophen, nitroGLYCERIN, fluticasone, B Complex Vitamins (VITAMIN B COMPLEX PO), allopurinol, atorvastatin, albuterol, Breztri Aerosphere, pantoprazole, furosemide, spironolactone, aspirin, losartan, metoprolol succinate, nicotine, empagliflozin, clopidogrel, and warfarin.  Allergies as of 03/30/2022   No Known Allergies      Medication List        Accurate as of March 30, 2022 11:59 PM. If you have any  questions, ask your nurse or doctor.          acetaminophen 500 MG tablet Commonly known as: TYLENOL Take 2 tablets (1,000 mg total) by mouth every 6 (six) hours as needed for moderate pain or headache.   albuterol 108 (90 Base) MCG/ACT inhaler Commonly known as: Ventolin HFA Inhale 2 puffs into the lungs every 6 (six) hours as needed for wheezing or shortness of breath.   allopurinol 100 MG tablet Commonly known as: ZYLOPRIM Take 1 tablet (100 mg total) by mouth daily.   Aspirin Low Dose 81 MG chewable tablet Generic drug: aspirin Chew 1 tablet (81 mg total) by mouth daily. Stop this after 30 days   atorvastatin 80 MG tablet Commonly known as: LIPITOR TAKE 1 TABLET EVERY DAY AT 6 PM What changed:  how much to take how to take this when to take  this additional instructions   Breztri Aerosphere 160-9-4.8 MCG/ACT Aero Generic drug: Budeson-Glycopyrrol-Formoterol Take 2 puffs by mouth in the morning and at bedtime. Dispense 3 inhalers   clopidogrel 75 MG tablet Commonly known as: PLAVIX Take 1 tablet (75 mg total) by mouth daily. Continue this for 1 year   fluticasone 50 MCG/ACT nasal spray Commonly known as: FLONASE Place 2 sprays into both nostrils daily as needed for allergies.   furosemide 20 MG tablet Commonly known as: LASIX Take 1 tablet (20 mg total) by mouth as needed (swelling).   Jardiance 10 MG Tabs tablet Generic drug: empagliflozin Take 1 tablet (10 mg total) by mouth daily.   losartan 25 MG tablet Commonly known as: COZAAR Take 1 tablet (25 mg total) by mouth daily.   metoprolol succinate 50 MG 24 hr tablet Commonly known as: Toprol XL Take 1 tablet (50 mg total) by mouth daily.   nicotine 14 mg/24hr patch Commonly known as: NICODERM CQ - dosed in mg/24 hours Place 1 patch (14 mg total) onto the skin daily.   nitroGLYCERIN 0.4 MG SL tablet Commonly known as: NITROSTAT Place 1 tablet (0.4 mg total) under the tongue every 5 (five) minutes as  needed for chest pain.   pantoprazole 40 MG tablet Commonly known as: PROTONIX Take 1 tablet (40 mg total) by mouth daily.   spironolactone 25 MG tablet Commonly known as: Aldactone Take 0.5 tablets (12.5 mg total) by mouth daily.   VITAMIN B COMPLEX PO Take 1 tablet by mouth daily.   Vitamin D3 125 MCG (5000 UT) Caps Take 5,000 Units by mouth daily.   warfarin 5 MG tablet Commonly known as: COUMADIN Take as directed by the anticoagulation clinic. If you are unsure how to take this medication, talk to your nurse or doctor. Original instructions: Take 1-2 tablets (5-10 mg total) by mouth See admin instructions. Take '10mg'$  for 2days then resume usual regimen of 5 mg on Monday, 2.5 mg all other days Please get INR checked on Friday 8/25         Follow-up: Return in about 1 month (around 04/30/2022).  [name], M.D.

## 2022-03-30 NOTE — Telephone Encounter (Signed)
Hospital follow up scheduled for 03/30/22 @ 3:55pm.

## 2022-03-31 LAB — COAGUCHEK XS/INR WAIVED
INR: 1.3 — ABNORMAL HIGH (ref 0.9–1.1)
Prothrombin Time: 15.6 s

## 2022-04-03 ENCOUNTER — Telehealth: Payer: Self-pay | Admitting: Emergency Medicine

## 2022-04-03 DIAGNOSIS — F17210 Nicotine dependence, cigarettes, uncomplicated: Secondary | ICD-10-CM

## 2022-04-03 DIAGNOSIS — Z122 Encounter for screening for malignant neoplasm of respiratory organs: Secondary | ICD-10-CM

## 2022-04-03 DIAGNOSIS — Z87891 Personal history of nicotine dependence: Secondary | ICD-10-CM

## 2022-04-04 ENCOUNTER — Ambulatory Visit: Payer: Medicare HMO | Admitting: Family Medicine

## 2022-04-04 ENCOUNTER — Ambulatory Visit: Payer: Medicare HMO | Attending: Medical | Admitting: Medical

## 2022-04-04 ENCOUNTER — Encounter: Payer: Self-pay | Admitting: Medical

## 2022-04-04 VITALS — BP 124/74 | HR 87 | Ht 66.0 in | Wt 144.5 lb

## 2022-04-04 DIAGNOSIS — I214 Non-ST elevation (NSTEMI) myocardial infarction: Secondary | ICD-10-CM | POA: Diagnosis not present

## 2022-04-04 DIAGNOSIS — I5022 Chronic systolic (congestive) heart failure: Secondary | ICD-10-CM | POA: Diagnosis not present

## 2022-04-04 DIAGNOSIS — E782 Mixed hyperlipidemia: Secondary | ICD-10-CM

## 2022-04-04 DIAGNOSIS — I10 Essential (primary) hypertension: Secondary | ICD-10-CM

## 2022-04-04 DIAGNOSIS — I2510 Atherosclerotic heart disease of native coronary artery without angina pectoris: Secondary | ICD-10-CM

## 2022-04-04 MED ORDER — METOPROLOL SUCCINATE ER 50 MG PO TB24: 50.0000 mg | ORAL_TABLET | Freq: Every day | ORAL | 3 refills | Status: DC

## 2022-04-04 MED ORDER — CLOPIDOGREL BISULFATE 75 MG PO TABS
75.0000 mg | ORAL_TABLET | Freq: Every day | ORAL | 11 refills | Status: DC
Start: 2022-04-04 — End: 2022-05-12

## 2022-04-04 MED ORDER — LOSARTAN POTASSIUM 25 MG PO TABS: 25.0000 mg | ORAL_TABLET | Freq: Every day | ORAL | 3 refills | Status: DC

## 2022-04-04 NOTE — Telephone Encounter (Signed)
ATC [name] (D[facility]) patient's wife.  LM to call back.  Per Dr. Agustina Caroli LOV 06/02/21- Instructions  Please continue Breztri 2 puffs twice a day.  Rinse and gargle after using. Keep your albuterol available use 2 puffs when you need it for short of breath, chest tightness, wheezing. Try using guaifenesin 600 mg (Mucinex) 1-2 times daily to help loosen mucus We reviewed your CT scan of the chest today.  Your pulmonary nodule is improved.  We will transition over into the lung cancer screening program.  Your next scan will need to be in September 2023.  We will try to get this done at [facility] Flu shot up-to-date Consider getting the new COVID-19 booster this fall Continue to work on decreasing your cigarettes. Follow with Dr. Lamonte Sakai in 12 months or sooner if you have any problems

## 2022-04-04 NOTE — Patient Instructions (Addendum)
Medication Instructions:  Your physician recommends that you continue on your current medications as directed. Please refer to the Current Medication list given to you today.   Labwork: BMET  Testing/Procedures: None  Follow-Up: Keep follow up with [name].   Any Other Special Instructions Will Be Listed Below (If Applicable).  You have been referred to Cardiac Rehab. They will call you with your first appointment.    If you need a refill on your cardiac medications before your next appointment, please call your pharmacy.

## 2022-04-04 NOTE — Progress Notes (Signed)
Cardiology Office Note:    Date:  04/04/2022   ID:  DATHAN ATTIA, DOB [birth year], MRN [id number]  PCP:  [name], MD  [facility] Cardiologist:  [name], MD  [facility] Electrophysiologist:  None   Referring MD: [name], MD   Chief Complaint: Hospital follow-up  History of Present Illness:    [name] is a 76 y.o. male with a hx of chronic systolic and diastolic heart failure, CAD prior MI in 1997 and CABG x4 in 2001, patent grafts 2011, HTN, HLD, PAF on coumadin, COPD. Patient has new systolic CHF 04/3817 LVEF 29-93% being treated medically.  Cath in 2011 showed patent grafts. Echo 04/2013 showed LVEF 60%. Myoview Lexiscan 12/2012 showed old inferolateral scar, no ischemia.   Echo 02/2022 showed LVEF 40-45%, mild RV dysfunction, mild to mod MR. Amlodipine was stopped and he was started on spironolactone.   Admitted 03/27/22 for chest pain. HS trop 21>20. EKG without acute change. However, given mild drop in LVEF and regional wall motion abnormalities noted on recent outpatient Echo last month, decision made to proceed with cardiac catheterization. R/LHC showed 3 vessel occlusive native CAD with patent LIMA to LAD, patent SVG to 1st Diag, patent SVG to OM1 with a 80% focal stenosis in the proximal SVG, and an occluded SVG to RCA (RCA fills by left to right collaterals. LV filling pressures were mildly elevated and he was noted to have mild pulmonary HTN. Cardiac output normal. He underwent successful PCI with DES to SVG to OM1. Started on triple therapy with ASA, plavix and coumadin for 1 month. After 1 month, can stop ASA and continued Plavix. Started on Jardiance.   Today, the patient is overall doing well. He is taking it easy at home. The cath site is stable. He is taking ASA, plavix and coumadin. Ok to stop ASA after 1 month of triple therapy, after September 21st.[facility] through [facility]. BP and HR good. Patient is willing to  cardiac rehab. Mo chest pain, SOB, LLE, orthopnea, pnd, palpitations, lightheadedness.    Past Medical History:  Diagnosis Date   Anginal pain ([place])    Atrial fibrillation by electrocardiogram ([place]) 11/2019   Atrial flutter by electrocardiogram [facility]) 11/2019   Bladder cancer ([place]) 01/2020   Bladder tumor    BPH (benign prostatic hyperplasia)    CAD (coronary artery disease)    a. s/p INF MI 1997;  b. s/p CABG 2001;  c. [place] 11/2009:  3v CAD, S-PDA ok with 40% mid, S-OM ok, S-Dx ok, L-LAD ok, EF 50%;  d.  Lex MV 5/14:  Inferolateral scar, EF 46%, no ischemia   COPD (chronic obstructive pulmonary disease) (HCC)    Dyspnea    ETOH abuse    GERD (gastroesophageal reflux disease)    History of gout    Hypercholesterolemia    Hypertension    Myocardial infarction [facility]) 2013   Prostate cancer ([place])    Tobacco abuse     Past Surgical History:  Procedure Laterality Date   ANTERIOR CERVICAL DECOMPRESSION/DISCECTOMY FUSION 4 LEVELS N/A 07/21/2016   Procedure: ANTERIOR CERVICAL DECOMPRESSION/DISCECTOMY FUSION CERVICAL TWO-THREE, CERVICAL THREE-FOUR,. CERVICAL FOUR-FIVE, CERVICAL  FIVE-SIX;  Surgeon: Ashok Pall, MD;  Location: [place];  Service: Neurosurgery;  Laterality: N/A;   BACK SURGERY     CARDIAC CATHETERIZATION  2011   CARDIOVERSION N/A 06/10/2020   Procedure: CARDIOVERSION;  Surgeon: Arnoldo Lenis, MD;  [facility];  Service: Endoscopy;  Laterality: N/A;   COLONOSCOPY     in remote past, Dr. Sharlett Iles. Obtaining records.    COLONOSCOPY N/A 11/21/2017   Surgeon: Daneil Dolin, MD; Three 4-6 mm polyps in the rectum and splenic flexure resected and retrieved (tubular adenomas), nonbleeding internal hemorrhoids.  Repeat in 5 years.   COLONOSCOPY WITH PROPOFOL N/A 06/09/2021   Surgeon: Daneil Dolin, MD;  Three 3-7 mm polyps in the ascending colon resected and retrieved, mild radiation proctitis, otherwise normal exam. Pathology revealed tubular adenomas.  Recommended  repeat colonoscopy in 5 years.   CORONARY ANGIOPLASTY     CORONARY ARTERY BYPASS GRAFT     CORONARY STENT INTERVENTION N/A 03/27/2022   Procedure: CORONARY STENT INTERVENTION;  Surgeon: Martinique, Peter M, MD;  Location: [place] [facility];  Service: Cardiovascular;  Laterality: N/A;   ESOPHAGOGASTRODUODENOSCOPY  05/27/2012   ZOX:WRUEAV esophagus, stomach and duodenum s/p dilator   POLYPECTOMY  11/21/2017   Procedure: POLYPECTOMY;  Surgeon: Daneil Dolin, MD;  [facility];  Service: Endoscopy;;  colon   POLYPECTOMY  06/09/2021   Procedure: POLYPECTOMY;  Surgeon: Daneil Dolin, MD;  [facility];  Service: Endoscopy;;   PROSTATE SURGERY     RIGHT/LEFT HEART CATH AND CORONARY/GRAFT ANGIOGRAPHY N/A 03/27/2022   Procedure: RIGHT/LEFT HEART CATH AND CORONARY/GRAFT ANGIOGRAPHY;  Surgeon: Martinique, Peter M, MD;  Location: [place] [facility];  Service: Cardiovascular;  Laterality: N/A;   TRANSURETHRAL RESECTION OF BLADDER TUMOR WITH MITOMYCIN-C Left 01/06/2020   Procedure: CYSTOSCOPY TRANSURETHRAL RESECTION OF BLADDER TUMOR,LEFT URETERSCOPY AND GEMCITABINE;  Surgeon: Irine Seal, MD;  Location: [facility];  Service: Urology;  Laterality: Left;    Current Medications: Current Meds  Medication Sig   acetaminophen (TYLENOL) 500 MG tablet Take 2 tablets (1,000 mg total) by mouth every 6 (six) hours as needed for moderate pain or headache.   albuterol (VENTOLIN HFA) 108 (90 Base) MCG/ACT inhaler Inhale 2 puffs into the lungs every 6 (six) hours as needed for wheezing or shortness of breath.   allopurinol (ZYLOPRIM) 100 MG tablet Take 1 tablet (100 mg total) by mouth daily.   aspirin 81 MG chewable tablet Chew 1 tablet (81 mg total) by mouth daily. Stop this after 30 days   atorvastatin (LIPITOR) 80 MG tablet TAKE 1 TABLET EVERY DAY AT 6 PM (Patient taking differently: Take 80 mg by mouth daily at 6 PM.)   B Complex Vitamins (VITAMIN B COMPLEX PO) Take 1 tablet by mouth daily.    Budeson-Glycopyrrol-Formoterol (BREZTRI AEROSPHERE) 160-9-4.8 MCG/ACT AERO Take 2 puffs by mouth in the morning and at bedtime. Dispense 3 inhalers   Cholecalciferol (VITAMIN D3) 125 MCG (5000 UT) CAPS Take 5,000 Units by mouth daily.   empagliflozin (JARDIANCE) 10 MG TABS tablet Take 1 tablet (10 mg total) by mouth daily.   fluticasone (FLONASE) 50 MCG/ACT nasal spray Place 2 sprays into both nostrils daily as needed for allergies.   furosemide (LASIX) 20 MG tablet Take 1 tablet (20 mg total) by mouth as needed (swelling).   nicotine (NICODERM CQ - DOSED IN MG/24 HOURS) 14 mg/24hr patch Place 1 patch (14 mg total) onto the skin daily.   nitroGLYCERIN (NITROSTAT) 0.4 MG SL tablet Place 1 tablet (0.4 mg total) under the tongue every 5 (five) minutes as needed for chest pain.   pantoprazole (PROTONIX) 40 MG tablet Take 1 tablet (40 mg total) by mouth daily.   spironolactone (ALDACTONE) 25 MG tablet Take 0.5 tablets (12.5 mg total) by mouth  daily.   warfarin (COUMADIN) 5 MG tablet Take 1-2 tablets (5-10 mg total) by mouth See admin instructions. Take '10mg'$  for 2days then resume usual regimen of 5 mg on Monday, 2.5 mg all other days Please get INR checked on Friday 8/25   [DISCONTINUED] clopidogrel (PLAVIX) 75 MG tablet Take 1 tablet (75 mg total) by mouth daily. Continue this for 1 year   [DISCONTINUED] losartan (COZAAR) 25 MG tablet Take 1 tablet (25 mg total) by mouth daily.   [DISCONTINUED] metoprolol succinate (TOPROL XL) 50 MG 24 hr tablet Take 1 tablet (50 mg total) by mouth daily.     Allergies:   Patient has no known allergies.   Social History   Socioeconomic History   Marital status: Married    Spouse name: Not on file   Number of children: 2   Years of education: Not on file   Highest education level: Not on file  Occupational History    Comment: retired  Tobacco Use   Smoking status: Every Day    Packs/day: 0.50    Years: 58.00    Total pack years: 29.00    Types: Cigarettes,  E-cigarettes    Start date: 03/08/1970   Smokeless tobacco: Former    Types: Snuff    Quit date: 03/08/2012   Tobacco comments:    1/2 ppd  Vaping Use   Vaping Use: Every day  Substance and Sexual Activity   Alcohol use: Yes    Alcohol/week: 2.0 - 4.0 standard drinks of alcohol    Types: 2 - 4 Cans of beer per week    Comment: 2-4 beers per day   Drug use: No   Sexual activity: Yes    Partners: Female  Other Topics Concern   Not on file  Social History Narrative   His brother lives with him and his wife. Daughter lives in [place], son lives nearby.   Social Determinants of Health   Financial Resource Strain: Low Risk  (11/28/2021)   Overall Financial Resource Strain (CARDIA)    Difficulty of Paying Living Expenses: Not hard at all  Food Insecurity: No Food Insecurity (11/28/2021)   Hunger Vital Sign    Worried About Running Out of Food in the Last Year: Never true    Ran Out of Food in the Last Year: Never true  Transportation Needs: No Transportation Needs (11/28/2021)   PRAPARE - Hydrologist (Medical): No    Lack of Transportation (Non-Medical): No  Physical Activity: Insufficiently Active (11/28/2021)   Exercise Vital Sign    Days of Exercise per Week: 7 days    Minutes of Exercise per Session: 20 min  Stress: No Stress Concern Present (11/28/2021)   [place]    Feeling of Stress : Not at all  Social Connections: Moderately Isolated (11/28/2021)   Social Connection and Isolation Panel [NHANES]    Frequency of Communication with Friends and Family: More than three times a week    Frequency of Social Gatherings with Friends and Family: More than three times a week    Attends Religious Services: Never    Marine scientist or Organizations: No    Attends Music therapist: Never    Marital Status: Married     Family History: The patient's family history includes  Diabetes in his brother, brother, and mother; Healthy in his son; Heart disease in his brother and brother; Hypertension in his father;  Multiple sclerosis in his daughter. There is no history of Colon cancer or Inflammatory bowel disease.  ROS:   Please see the history of present illness.     All other systems reviewed and are negative.  EKGs/Labs/Other Studies Reviewed:    The following studies were reviewed today:  Echocardiogram 02/09/2022: Impressions:  1. Left ventricular ejection fraction, by estimation, is 40 to 45%. The  left ventricle has mildly decreased function. The left ventricle  demonstrates regional wall motion abnormalities (see scoring  diagram/findings for description). There is mild  asymmetric left ventricular hypertrophy of the basal segment. Left  ventricular diastolic parameters are indeterminate. The average left  ventricular global longitudinal strain is -10.0 %. The global longitudinal  strain is abnormal.   2. Right ventricular systolic function is mildly reduced. The right  ventricular size is normal. There is normal pulmonary artery systolic  pressure. The estimated right ventricular systolic pressure is 17.4 mmHg.   3. Left atrial size was mildly dilated.   4. The mitral valve is grossly normal. Mild to moderate mitral valve  regurgitation.   5. Tricuspid valve regurgitation is mild to moderate.   6. The aortic valve is tricuspid. Aortic valve regurgitation is not  visualized.   7. The inferior vena cava is normal in size with greater than 50%  respiratory variability, suggesting right atrial pressure of 3 mmHg.   Comparison(s): Prior images reviewed side by side. LVEF has decreased in  comparison to prior study.    _______________   Right/Left Cardiac Catheterization 03/27/2022:   Ost LAD to Prox LAD lesion is 100% stenosed.   Mid LAD lesion is 100% stenosed.   Prox Cx lesion is 100% stenosed.   Prox RCA to Mid RCA lesion is 80% stenosed.   Mid  RCA lesion is 100% stenosed.   Origin to Prox Graft lesion is 80% stenosed.   Origin to Prox Graft lesion is 100% stenosed.   A drug-eluting stent was successfully placed using a SYNERGY XD 4.0X12.   Post intervention, there is a 0% residual stenosis.   LIMA graft was visualized by angiography and is normal in caliber.   SVG graft was visualized by angiography and is normal in caliber.   SVG graft was visualized by angiography and is normal in caliber.   SVG graft was visualized by angiography.   The graft exhibits no disease.   The graft exhibits no disease.   LV end diastolic pressure is mildly elevated.   Hemodynamic findings consistent with mild pulmonary hypertension.   3 vessel occlusive CAD Patent LIMA to the LAD Patent SVG to the first diagonal Patent SVG to OM1 with 80% focal stenosis in the proximal SVG Occluded SVG to RCA. The RCA fills by left to right collaterals.  Mildly elevated LV filling pressures.  Mild pulmonary HTN.  Normal cardiac output 4.51 L/min with index 2.53.  Successful PCI of the SVG to OM1 with DES   Plan: DAPT with ASA for one month and Plavix for 12 months. Can resume oral anticoagulation in am if no bleeding noted.    Diagnostic Dominance: Right  Intervention         EKG:  EKG is not ordered today.   Recent Labs: 11/28/2021: BNP 146.4 03/26/2022: ALT 22; Magnesium 1.7 03/28/2022: BUN 14; Creatinine, Ser 1.21; Hemoglobin 10.1; Platelets 135; Potassium 3.6; Sodium 139  Recent Lipid Panel    Component Value Date/Time   CHOL 116 03/26/2022 0514   CHOL 120 11/28/2021 1345  TRIG 108 03/26/2022 0514   HDL 43 03/26/2022 0514   HDL 49 11/28/2021 1345   CHOLHDL 2.7 03/26/2022 0514   VLDL 22 03/26/2022 0514   LDLCALC 51 03/26/2022 0514   LDLCALC 50 11/28/2021 1345     Physical Exam:    VS:  BP 124/74   Pulse 87   Ht '5\' 6"'$  (1.676 m)   Wt 144 lb 8 oz (65.5 kg)   SpO2 97%   BMI 23.32 kg/m     Wt Readings from Last 3 Encounters:   04/04/22 144 lb 8 oz (65.5 kg)  03/30/22 145 lb 3.2 oz (65.9 kg)  03/27/22 141 lb 8.6 oz (64.2 kg)     GEN:  Well nourished, well developed in no acute distress HEENT: Normal NECK: No JVD; No carotid bruits LYMPHATICS: No lymphadenopathy CARDIAC: Irreg Irreg, no murmurs, rubs, gallops RESPIRATORY:  Clear to auscultation without rales, wheezing or rhonchi  ABDOMEN: Soft, non-tender, non-distended MUSCULOSKELETAL:  No edema; No deformity  SKIN: Warm and dry NEUROLOGIC:  Alert and oriented x 3 PSYCHIATRIC:  Normal affect   ASSESSMENT:    1. NSTEMI (non-ST elevated myocardial infarction) ([place])   2. Coronary artery disease involving native coronary artery of native heart without angina pectoris   3. Chronic systolic heart failure ([place])   4. Essential hypertension   5. Mixed hyperlipidemia    PLAN:    In order of problems listed above:  CAD s/p remote CABG and recent stenting NSTEMI Recent admission for chest pain with minimally elevated troponin. Prior echo showed reduced EF and patient was taken for R/L heat cath. 3 vessel occlusive native CAD with patent LIMA to LAD, patent SVG to 1st Diag, patent SVG to OM1 with a 80% focal stenosis in the proximal SVG, and an occluded SVG to RCA (RCA fills by left to right collaterals. LV filling pressures were mildly elevated and he was noted to have mild pulmonary HTN. Cardiac output normal. He underwent successful PCI with DES to SVG to OM1. He is recovering well at home. Right groin cath site is healing well. He is on triple therapy with ASA, Plavix and Coumadin x 1 month, after September 21st he can switch to dual therapy with Plavix and coumadin. I will refer the patient to cardiac rehab. Continue statin and BB therapy.  Chronic systolic CHF Echo showed LVEF 40-45%. He was started on Jardiance '10mg'$  daily, Losartan '25mg'$  daily, Toprol '50mg'$  daily and spironolactone 12.'5mg'$  daily. He still has not received Jardiance, patient assistance form was  filled out today. Continue lasix '20mg'$  PRN. I will check a BMET today. At follow-up can consider transition from Losartan to [facility].   Persistent Afib Rate controlled with Toprol. Continue coumadin for stroke ppx. He follows at the coumadin clinic. CHADSVASC at least 4.   HTN BP is normal today, continue current medications.   HLD LDL 51. Continue Lipitor '80mg'$  daily.   Disposition: Follow up in 1 month(s) with MD/APP    Signed, Annika Selke Ninfa Meeker, PA-C  04/04/2022 4:24 PM     [facility]

## 2022-04-05 ENCOUNTER — Ambulatory Visit: Payer: Medicare HMO | Attending: Cardiology | Admitting: *Deleted

## 2022-04-05 DIAGNOSIS — Z5181 Encounter for therapeutic drug level monitoring: Secondary | ICD-10-CM | POA: Diagnosis not present

## 2022-04-05 DIAGNOSIS — D649 Anemia, unspecified: Secondary | ICD-10-CM | POA: Diagnosis not present

## 2022-04-05 DIAGNOSIS — I4891 Unspecified atrial fibrillation: Secondary | ICD-10-CM | POA: Diagnosis not present

## 2022-04-05 LAB — CBC WITH DIFFERENTIAL/PLATELET
Absolute Monocytes: 380 cells/uL (ref 200–950)
Basophils Absolute: 30 cells/uL (ref 0–200)
Basophils Relative: 0.6 %
Eosinophils Absolute: 90 cells/uL (ref 15–500)
Eosinophils Relative: 1.8 %
HCT: 34 % — ABNORMAL LOW (ref 38.5–50.0)
Hemoglobin: 11.6 g/dL — ABNORMAL LOW (ref 13.2–17.1)
Lymphs Abs: 2020 cells/uL (ref 850–3900)
MCH: 34.7 pg — ABNORMAL HIGH (ref 27.0–33.0)
MCHC: 34.1 g/dL (ref 32.0–36.0)
MCV: 101.8 fL — ABNORMAL HIGH (ref 80.0–100.0)
MPV: 9.7 fL (ref 7.5–12.5)
Monocytes Relative: 7.6 %
Neutro Abs: 2480 cells/uL (ref 1500–7800)
Neutrophils Relative %: 49.6 %
Platelets: 261 10*3/uL (ref 140–400)
RBC: 3.34 10*6/uL — ABNORMAL LOW (ref 4.20–5.80)
RDW: 14.2 % (ref 11.0–15.0)
Total Lymphocyte: 40.4 %
WBC: 5 10*3/uL (ref 3.8–10.8)

## 2022-04-05 LAB — POCT INR: INR: 2.8 (ref 2.0–3.0)

## 2022-04-05 NOTE — Patient Instructions (Signed)
Can stop ASA after 04/27/22 Restart warfarin 1/2 tablet daily except 1 tablet on Mondays Recheck in 1 wk

## 2022-04-06 NOTE — Telephone Encounter (Signed)
[name] is calling back to speak with [name].  Please call her to discuss results when available.

## 2022-04-06 NOTE — Telephone Encounter (Signed)
No order has been placed for the scan to be done September 2023.  Routing this to Trent so she can get the proper order placed.

## 2022-04-06 NOTE — Telephone Encounter (Signed)
Spoke with pt's spouse [name] [facility]) and scheduled f/u lung screening CT for 04/11/22 3:00 at [facility]. ( Pt must formerly followed in the [place] screening program) .

## 2022-04-11 ENCOUNTER — Ambulatory Visit (HOSPITAL_COMMUNITY)
Admission: RE | Admit: 2022-04-11 | Discharge: 2022-04-11 | Disposition: A | Discharge status: Home / Self Care | Payer: Medicare HMO | Source: Ambulatory Visit | Attending: Acute Care | Admitting: Acute Care

## 2022-04-11 ENCOUNTER — Other Ambulatory Visit: Payer: Self-pay | Admitting: Acute Care

## 2022-04-11 DIAGNOSIS — Z87891 Personal history of nicotine dependence: Secondary | ICD-10-CM | POA: Diagnosis not present

## 2022-04-11 DIAGNOSIS — F17210 Nicotine dependence, cigarettes, uncomplicated: Secondary | ICD-10-CM | POA: Diagnosis not present

## 2022-04-11 DIAGNOSIS — Z122 Encounter for screening for malignant neoplasm of respiratory organs: Secondary | ICD-10-CM | POA: Diagnosis not present

## 2022-04-13 ENCOUNTER — Ambulatory Visit: Payer: Medicare HMO | Attending: Cardiology | Admitting: *Deleted

## 2022-04-13 DIAGNOSIS — I4891 Unspecified atrial fibrillation: Secondary | ICD-10-CM | POA: Diagnosis not present

## 2022-04-13 DIAGNOSIS — Z5181 Encounter for therapeutic drug level monitoring: Secondary | ICD-10-CM | POA: Diagnosis not present

## 2022-04-13 LAB — POCT INR: INR: 1.8 — AB (ref 2.0–3.0)

## 2022-04-13 NOTE — Patient Instructions (Signed)
Can stop ASA after 04/27/22 Take warfarin 1 tablet tonight then resume 1/2 tablet daily except 1 tablet on Mondays Recheck in 10 days

## 2022-04-14 ENCOUNTER — Other Ambulatory Visit: Payer: Self-pay

## 2022-04-14 ENCOUNTER — Telehealth: Payer: Self-pay | Admitting: *Deleted

## 2022-04-14 DIAGNOSIS — Z122 Encounter for screening for malignant neoplasm of respiratory organs: Secondary | ICD-10-CM

## 2022-04-14 DIAGNOSIS — F17210 Nicotine dependence, cigarettes, uncomplicated: Secondary | ICD-10-CM

## 2022-04-14 DIAGNOSIS — Z87891 Personal history of nicotine dependence: Secondary | ICD-10-CM

## 2022-04-14 NOTE — Telephone Encounter (Signed)
Received fax from [facility] that patient has been approved until 08/06/22. Wife notified.

## 2022-04-24 ENCOUNTER — Encounter: Payer: Self-pay | Admitting: *Deleted

## 2022-04-24 ENCOUNTER — Ambulatory Visit: Payer: Medicare HMO | Attending: Cardiology | Admitting: *Deleted

## 2022-04-24 DIAGNOSIS — Z5181 Encounter for therapeutic drug level monitoring: Secondary | ICD-10-CM | POA: Diagnosis not present

## 2022-04-24 DIAGNOSIS — I4891 Unspecified atrial fibrillation: Secondary | ICD-10-CM

## 2022-04-24 LAB — POCT INR: INR: 2 (ref 2.0–3.0)

## 2022-04-24 NOTE — Patient Instructions (Signed)
Can stop ASA after 04/27/22 Increase warfarin to 1/2 tablet daily except 1 tablet on Mondays and Thursdays Recheck in 2 wks

## 2022-05-04 ENCOUNTER — Other Ambulatory Visit: Payer: Self-pay | Admitting: Family Medicine

## 2022-05-04 DIAGNOSIS — R1030 Lower abdominal pain, unspecified: Secondary | ICD-10-CM

## 2022-05-04 DIAGNOSIS — K625 Hemorrhage of anus and rectum: Secondary | ICD-10-CM

## 2022-05-04 DIAGNOSIS — K921 Melena: Secondary | ICD-10-CM

## 2022-05-08 ENCOUNTER — Ambulatory Visit: Payer: Medicare HMO | Attending: Cardiology | Admitting: *Deleted

## 2022-05-08 DIAGNOSIS — Z5181 Encounter for therapeutic drug level monitoring: Secondary | ICD-10-CM

## 2022-05-08 DIAGNOSIS — I4891 Unspecified atrial fibrillation: Secondary | ICD-10-CM

## 2022-05-08 LAB — POCT INR: INR: 2.4 (ref 2.0–3.0)

## 2022-05-08 NOTE — Patient Instructions (Signed)
Continue warfarin 1/2 tablet daily except 1 tablet on Mondays and Thursdays Recheck in 4 wks 

## 2022-05-12 ENCOUNTER — Encounter: Payer: Self-pay | Admitting: Cardiology

## 2022-05-12 ENCOUNTER — Ambulatory Visit: Payer: Medicare HMO | Attending: Cardiology | Admitting: Cardiology

## 2022-05-12 VITALS — BP 120/72 | HR 83 | Ht 66.0 in | Wt 146.0 lb

## 2022-05-12 DIAGNOSIS — I4891 Unspecified atrial fibrillation: Secondary | ICD-10-CM

## 2022-05-12 DIAGNOSIS — I2510 Atherosclerotic heart disease of native coronary artery without angina pectoris: Secondary | ICD-10-CM

## 2022-05-12 DIAGNOSIS — I10 Essential (primary) hypertension: Secondary | ICD-10-CM | POA: Diagnosis not present

## 2022-05-12 DIAGNOSIS — I5022 Chronic systolic (congestive) heart failure: Secondary | ICD-10-CM | POA: Diagnosis not present

## 2022-05-12 MED ORDER — SPIRONOLACTONE 25 MG PO TABS: 12.5000 mg | ORAL_TABLET | Freq: Every day | ORAL | 3 refills | Status: DC

## 2022-05-12 MED ORDER — METOPROLOL SUCCINATE ER 50 MG PO TB24: 75.0000 mg | ORAL_TABLET | Freq: Every day | ORAL | 3 refills | Status: DC

## 2022-05-12 MED ORDER — CLOPIDOGREL BISULFATE 75 MG PO TABS: 75.0000 mg | ORAL_TABLET | Freq: Every day | ORAL | 3 refills | Status: DC

## 2022-05-12 MED ORDER — LOSARTAN POTASSIUM 25 MG PO TABS: 25.0000 mg | ORAL_TABLET | Freq: Every day | ORAL | 3 refills | Status: DC

## 2022-05-12 MED ORDER — APIXABAN 5 MG PO TABS: 5.0000 mg | ORAL_TABLET | Freq: Two times a day (BID) | ORAL | 6 refills | Status: DC

## 2022-05-12 NOTE — Progress Notes (Signed)
Clinical Summary Mr. [name] is a 76 y.o.male seen today for follow up of the following medical rpoblems.   1. LE edema/New diagnosis chronic systolic HF - started about 2 months ago, which is new for him - 12/2019 echo LVEF 53%, indet diastolic fxn.  - 11/2021 BNP 146   02/2022 echo LVEF 40-45%, mild RV dysfunction, mild to mod MR  - breathing is improving - taken meds daily, was able to get jardiance.  - no recent edema     2. CAD   - prior MI in 1997, prior CABG  - cath 2011 showed patent grafts. 04/2013 echo shows LVEF 60%   - myoview 12/2012 shows old inferolateral scar, no ischemia    -admit 03/2022 with chest pain. Atypical but given recent drop in LVEF pursued cath - 03/27/22 cath as reported below, PCI to SVG-OM1.     -no chest pains. Completed one month triple therapy, off asa and just on plavix and coumadin.      3. HTN    - lisionpril stopped by pcp per patient report. He did have some elevation of K after starting.     - compliant with meds   4. HL     05/2021 TC 121 TG 120 HDL 58 LDL 42 11/2021 TC 120 TG 117 HDL 49 LDL 50     4. COPD - followed by pcp - significant cough starting Friday - +cough, some SOB brownish. Some wheezing.    5. AAA screen - negative [place] 06/2016   6. Afib - new diagnosis during 11/2019 preop evaluation  08/11/19 DCCV succesful   - no recent palpitaiotns - compliant with coumadin, no recent bleeding.  - eliquis previously was too expensive Past Medical History:  Diagnosis Date   Anginal pain ([place])    Atrial fibrillation by electrocardiogram ([place]) 11/2019   Atrial flutter by electrocardiogram ([place]) 11/2019   Bladder cancer ([place]) 01/2020   Bladder tumor    BPH (benign prostatic hyperplasia)    CAD (coronary artery disease)    a. s/p INF MI 1997;  b. s/p CABG 2001;  c. [place] 11/2009:  3v CAD, S-PDA ok with 40% mid, S-OM ok, S-Dx ok, L-LAD ok, EF 50%;  d.  Lex MV 5/14:  Inferolateral scar, EF 46%, no ischemia   COPD (chronic  obstructive pulmonary disease) (HCC)    Dyspnea    ETOH abuse    GERD (gastroesophageal reflux disease)    History of gout    Hypercholesterolemia    Hypertension    Myocardial infarction [facility]) 2013   Prostate cancer (HCC)    Tobacco abuse      No Known Allergies   Current Outpatient Medications  Medication Sig Dispense Refill   acetaminophen (TYLENOL) 500 MG tablet Take 2 tablets (1,000 mg total) by mouth every 6 (six) hours as needed for moderate pain or headache. 90 tablet 0   albuterol (VENTOLIN HFA) 108 (90 Base) MCG/ACT inhaler Inhale 2 puffs into the lungs every 6 (six) hours as needed for wheezing or shortness of breath. 3 each 3   allopurinol (ZYLOPRIM) 100 MG tablet Take 1 tablet (100 mg total) by mouth daily. 90 tablet 3   atorvastatin (LIPITOR) 80 MG tablet TAKE 1 TABLET EVERY DAY AT 6 PM (Patient taking differently: Take 80 mg by mouth daily at 6 PM.) 90 tablet 3   B Complex Vitamins (VITAMIN B COMPLEX PO) Take 1 tablet by mouth daily.     Budeson-Glycopyrrol-Formoterol (  BREZTRI AEROSPHERE) 160-9-4.8 MCG/ACT AERO Take 2 puffs by mouth in the morning and at bedtime. Dispense 3 inhalers 32.1 g 5   Cholecalciferol (VITAMIN D3) 125 MCG (5000 UT) CAPS Take 5,000 Units by mouth daily.     clopidogrel (PLAVIX) 75 MG tablet Take 1 tablet (75 mg total) by mouth daily. Continue this for 1 year 30 tablet 11   empagliflozin (JARDIANCE) 10 MG TABS tablet Take 1 tablet (10 mg total) by mouth daily. 30 tablet 0   fluticasone (FLONASE) 50 MCG/ACT nasal spray Place 2 sprays into both nostrils daily as needed for allergies. 48 g 2   furosemide (LASIX) 20 MG tablet Take 1 tablet (20 mg total) by mouth as needed (swelling). 30 tablet 2   losartan (COZAAR) 25 MG tablet Take 1 tablet (25 mg total) by mouth daily. 90 tablet 3   metoprolol succinate (TOPROL XL) 50 MG 24 hr tablet Take 1 tablet (50 mg total) by mouth daily. 90 tablet 3   nicotine (NICODERM CQ - DOSED IN MG/24 HOURS) 14 mg/24hr  patch Place 1 patch (14 mg total) onto the skin daily. 28 patch 0   nitroGLYCERIN (NITROSTAT) 0.4 MG SL tablet Place 1 tablet (0.4 mg total) under the tongue every 5 (five) minutes as needed for chest pain. 75 tablet 2   pantoprazole (PROTONIX) 40 MG tablet TAKE 1 TABLET (40 MG TOTAL) BY MOUTH DAILY. 90 tablet 0   spironolactone (ALDACTONE) 25 MG tablet Take 0.5 tablets (12.5 mg total) by mouth daily. 15 tablet 0   warfarin (COUMADIN) 5 MG tablet TAKE 1 TABLET EVERY DAY 90 tablet 0   No current facility-administered medications for this visit.     Past Surgical History:  Procedure Laterality Date   ANTERIOR CERVICAL DECOMPRESSION/DISCECTOMY FUSION 4 LEVELS N/A 07/21/2016   Procedure: ANTERIOR CERVICAL DECOMPRESSION/DISCECTOMY FUSION CERVICAL TWO-THREE, CERVICAL THREE-FOUR,. CERVICAL FOUR-FIVE, CERVICAL  FIVE-SIX;  Surgeon: Ashok Pall, MD;  Location: [facility];  Service: Neurosurgery;  Laterality: N/A;   BACK SURGERY     CARDIAC CATHETERIZATION  2011   CARDIOVERSION N/A 06/10/2020   Procedure: CARDIOVERSION;  Surgeon: Arnoldo Lenis, MD;  [facility];  Service: Endoscopy;  Laterality: N/A;   COLONOSCOPY     in remote past, Dr. Sharlett Iles. Obtaining records.    COLONOSCOPY N/A 11/21/2017   Surgeon: Daneil Dolin, MD; Three 4-6 mm polyps in the rectum and splenic flexure resected and retrieved (tubular adenomas), nonbleeding internal hemorrhoids.  Repeat in 5 years.   COLONOSCOPY WITH PROPOFOL N/A 06/09/2021   Surgeon: Daneil Dolin, MD;  Three 3-7 mm polyps in the ascending colon resected and retrieved, mild radiation proctitis, otherwise normal exam. Pathology revealed tubular adenomas.  Recommended repeat colonoscopy in 5 years.   CORONARY ANGIOPLASTY     CORONARY ARTERY BYPASS GRAFT     CORONARY STENT INTERVENTION N/A 03/27/2022   Procedure: CORONARY STENT INTERVENTION;  Surgeon: Martinique, Peter M, MD;  Location: [facility];  Service: Cardiovascular;  Laterality:  N/A;   ESOPHAGOGASTRODUODENOSCOPY  05/27/2012   MOQ:HUTMLY esophagus, stomach and duodenum s/p dilator   POLYPECTOMY  11/21/2017   Procedure: POLYPECTOMY;  Surgeon: Daneil Dolin, MD;  [facility];  Service: Endoscopy;;  colon   POLYPECTOMY  06/09/2021   Procedure: POLYPECTOMY;  Surgeon: Daneil Dolin, MD;  [facility];  Service: Endoscopy;;   PROSTATE SURGERY     RIGHT/LEFT HEART CATH AND CORONARY/GRAFT ANGIOGRAPHY N/A 03/27/2022   Procedure: RIGHT/LEFT HEART CATH  AND CORONARY/GRAFT ANGIOGRAPHY;  Surgeon: Martinique, Peter M, MD;  Location: [facility];  Service: Cardiovascular;  Laterality: N/A;   TRANSURETHRAL RESECTION OF BLADDER TUMOR WITH MITOMYCIN-C Left 01/06/2020   Procedure: CYSTOSCOPY TRANSURETHRAL RESECTION OF BLADDER TUMOR,LEFT URETERSCOPY AND GEMCITABINE;  Surgeon: Irine Seal, MD;  Location: [facility];  Service: Urology;  Laterality: Left;     No Known Allergies    Family History  Problem Relation Age of Onset   Diabetes Mother    Hypertension Father    Heart disease Brother    Diabetes Brother    Heart disease Brother    Diabetes Brother    Multiple sclerosis Daughter    Healthy Son    Colon cancer Neg Hx    Inflammatory bowel disease Neg Hx      Social History Mr. Vuncannon reports that he has been smoking cigarettes and e-cigarettes. He started smoking about 52 years ago. He has a 29.00 pack-year smoking history. He quit smokeless tobacco use about 10 years ago.  His smokeless tobacco use included snuff. Mr. Bangura reports current alcohol use of about 2.0 - 4.0 standard drinks of alcohol per week.   Review of Systems CONSTITUTIONAL: No weight loss, fever, chills, weakness or fatigue.  HEENT: Eyes: No visual loss, blurred vision, double vision or yellow sclerae.No hearing loss, sneezing, congestion, runny nose or sore throat.  SKIN: No rash or itching.  CARDIOVASCULAR: per hpi RESPIRATORY: No shortness of breath, cough or sputum.   GASTROINTESTINAL: No anorexia, nausea, vomiting or diarrhea. No abdominal pain or blood.  GENITOURINARY: No burning on urination, no polyuria NEUROLOGICAL: No headache, dizziness, syncope, paralysis, ataxia, numbness or tingling in the extremities. No change in bowel or bladder control.  MUSCULOSKELETAL: No muscle, back pain, joint pain or stiffness.  LYMPHATICS: No enlarged nodes. No history of splenectomy.  PSYCHIATRIC: No history of depression or anxiety.  ENDOCRINOLOGIC: No reports of sweating, cold or heat intolerance. No polyuria or polydipsia.  Marland Kitchen   Physical Examination Today's Vitals   05/12/22 1501  BP: 120/72  Pulse: 83  SpO2: 97%  Weight: 146 lb (66.2 kg)  Height: '5\' 6"'$  (1.676 m)   Body mass index is 23.57 kg/m.  Gen: resting comfortably, no acute distress HEENT: no scleral icterus, pupils equal round and reactive, no palptable cervical adenopathy,  CV: irreg,no m/rg, no jvd Resp: Clear to auscultation bilaterally GI: abdomen is soft, non-tender, non-distended, normal bowel sounds, no hepatosplenomegaly MSK: extremities are warm, no edema.  Skin: warm, no rash Neuro:  no focal deficits Psych: appropriate affect   Diagnostic Studies  11/25/2009:   Cardiac Cath Findings: Left ventricular angiogram was performed in the RAO projection and   showed normal left ventricular systolic function with ejection   fraction estimated at 50%. Mild mitral regurgitation was noted.   10.Aortic root angiogram was performed and did not show enlargement of   the aortic root.   IMPRESSION:   1. Severe triple-vessel coronary artery disease.   2. Patent bypass grafts 4/4.   3. Low normal left ventricular systolic function.     12/2012 Myoview Inferolateral scar, no active ischemia, LVEF 46%.     05/01/13 Echo: LVEF 55-60%, grade II diastolic dysfunction, multiple WMAs, mild MR,     06/2016 AAA [place] No aneurysm     02/2022 echo IMPRESSIONS     1. Left ventricular ejection  fraction, by estimation, is 40 to 45%. The  left ventricle has mildly decreased function. The left ventricle  demonstrates regional  wall motion abnormalities (see scoring  diagram/findings for description). There is mild  asymmetric left ventricular hypertrophy of the basal segment. Left  ventricular diastolic parameters are indeterminate. The average left  ventricular global longitudinal strain is -10.0 %. The global longitudinal  strain is abnormal.   2. Right ventricular systolic function is mildly reduced. The right  ventricular size is normal. There is normal pulmonary artery systolic  pressure. The estimated right ventricular systolic pressure is 24.2 mmHg.   3. Left atrial size was mildly dilated.   4. The mitral valve is grossly normal. Mild to moderate mitral valve  regurgitation.   5. Tricuspid valve regurgitation is mild to moderate.   6. The aortic valve is tricuspid. Aortic valve regurgitation is not  visualized.   7. The inferior vena cava is normal in size with greater than 50%  respiratory variability, suggesting right atrial pressure of 3 mmHg.      03/2022 RHC/LHC  Ost LAD to Prox LAD lesion is 100% stenosed.   Mid LAD lesion is 100% stenosed.   Prox Cx lesion is 100% stenosed.   Prox RCA to Mid RCA lesion is 80% stenosed.   Mid RCA lesion is 100% stenosed.   Origin to Prox Graft lesion is 80% stenosed.   Origin to Prox Graft lesion is 100% stenosed.   A drug-eluting stent was successfully placed using a SYNERGY XD 4.0X12.   Post intervention, there is a 0% residual stenosis.   LIMA graft was visualized by angiography and is normal in caliber.   SVG graft was visualized by angiography and is normal in caliber.   SVG graft was visualized by angiography and is normal in caliber.   SVG graft was visualized by angiography.   The graft exhibits no disease.   The graft exhibits no disease.   LV end diastolic pressure is mildly elevated.   Hemodynamic findings  consistent with mild pulmonary hypertension.   3 vessel occlusive CAD Patent LIMA to the LAD Patent SVG to the first diagonal Patent SVG to OM1 with 80% focal stenosis in the proximal SVG Occluded SVG to RCA. The RCA fills by left to right collaterals.  Mildly elevated LV filling pressures.  Mild pulmonary HTN.  Normal cardiac output 4.51 L/min with index 2.53.  Successful PCI of the SVG to OM1 with DES   Plan: DAPT with ASA for one month and Plavix for 12 months. Can resume oral anticoagulation in am if no bleeding noted.     Assessment and Plan  1.HFrEF, chronic - no recent symptoms - increase toprol to '75mg'$  daily. Prior high K on ACEi, have recently tried low dose ARB. Needs repeat bmet, may titrate ARB pending K. If K stable over time consider transition to entresto  2. CAD - recent stent as reported  above - no symptoms, continue plavix and coumadin  3. AFib - no symptoms - checked with pharmacy, 45$ copay for eliquis. Previous price was too high, patient ok with this charge. We will d/c coumadin, wait 3 days then start eliquis.   **wants to finish the coumadin he has at home, will update [place] when he is ready to switch   Arnoldo Lenis, M.D.

## 2022-05-12 NOTE — Patient Instructions (Addendum)
Medication Instructions:  Stop Warfarin (Coumadin) After 3 days, begin Eliquis '5mg'$  twice a day   Increase Toprol XL to '75mg'$  daily  Continue all other medications.     Labwork: BMET - order given today  Please do on Monday, 05/15/2022 Office will contact with results via phone, letter or mychart.     Testing/Procedures: none  Follow-Up:  6 weeks   Any Other Special Instructions Will Be Listed Below (If Applicable).   If you need a refill on your cardiac medications before your next appointment, please call your pharmacy.

## 2022-05-15 ENCOUNTER — Other Ambulatory Visit: Payer: Medicare HMO

## 2022-05-15 DIAGNOSIS — I4891 Unspecified atrial fibrillation: Secondary | ICD-10-CM | POA: Diagnosis not present

## 2022-05-15 DIAGNOSIS — I10 Essential (primary) hypertension: Secondary | ICD-10-CM | POA: Diagnosis not present

## 2022-05-15 DIAGNOSIS — I5022 Chronic systolic (congestive) heart failure: Secondary | ICD-10-CM | POA: Diagnosis not present

## 2022-05-16 LAB — BASIC METABOLIC PANEL
BUN/Creatinine Ratio: 10 (ref 10–24)
BUN: 10 mg/dL (ref 8–27)
CO2: 24 mmol/L (ref 20–29)
Calcium: 9.4 mg/dL (ref 8.6–10.2)
Chloride: 106 mmol/L (ref 96–106)
Creatinine, Ser: 1.05 mg/dL (ref 0.76–1.27)
Glucose: 97 mg/dL (ref 70–99)
Potassium: 3.9 mmol/L (ref 3.5–5.2)
Sodium: 141 mmol/L (ref 134–144)
eGFR: 74 mL/min/{1.73_m2} (ref >59)

## 2022-05-18 ENCOUNTER — Encounter: Payer: Self-pay | Admitting: Emergency Medicine

## 2022-05-18 ENCOUNTER — Ambulatory Visit (INDEPENDENT_AMBULATORY_CARE_PROVIDER_SITE_OTHER): Payer: Medicare HMO | Admitting: Emergency Medicine

## 2022-05-18 DIAGNOSIS — F17200 Nicotine dependence, unspecified, uncomplicated: Secondary | ICD-10-CM | POA: Diagnosis not present

## 2022-05-18 DIAGNOSIS — K219 Gastro-esophageal reflux disease without esophagitis: Secondary | ICD-10-CM | POA: Diagnosis not present

## 2022-05-18 DIAGNOSIS — J449 Chronic obstructive pulmonary disease, unspecified: Secondary | ICD-10-CM

## 2022-05-18 DIAGNOSIS — J301 Allergic rhinitis due to pollen: Secondary | ICD-10-CM | POA: Diagnosis not present

## 2022-05-18 NOTE — Progress Notes (Signed)
Subjective:    [name], male    DOB: 07/14/1946, 76 y.o.   MRN: [id number]  HPI  ROV 06/02/21 --76 year old active smoker who follows up today for COPD and pulmonary nodule on CT scan of the chest.  Past medical history also significant for CAD/CABG, hypertension, A. fib/flutter, GERD, rhinitis, bladder cancer and prostate cancer (Dr. Jeffie Pollock).  Last seen in April. Today he reports that he is doing well. He is able to walk his dog, sometimes has to uses his albuterol. Also occasionally at night. Her coughs in the am to clear mucous, thick white  Currently managed on Breztri, flonase, uses albuterol most days.  Still smoking about 10 cig a day  Pulmonary function testing performed today, reviewed by me, show mild obstruction without a bronchodilator response, hyperinflated lung volumes, decreased diffusion capacity consistent with emphysematous COPD  CT scan of the chest 04/27/2021 reviewed by me, shows that his anterior medial right upper lobe pulmonary nodule has decreased in size from 6 mm to 4 mm.  No hilar or mediastinal adenopathy.  ROV 05/18/2022 --follow-up visit 76 year old gentleman with history of active tobacco use (60 pack years), CAD/CABG, hypertension, A-fib/flutter, bladder cancer, prostate cancer.  I have seen him for dyspnea with obstructive lung disease as well as pulmonary nodule.  He is right upper lobe nodule decreased in size on his scan in 04/2021 so we transitioned him over to the lung cancer screening program. We have been managing him on Breztri. He is using albuterol 0-2x a day. He has cough every morning, has white mucous. Recent URI. Smoking about 10 cig a day.  He was having increased exertional SOB over the last 6 months, underwent PCI to a blocked SVG from his CABG. He has improved some since then but is still having exertional SOB  LDCT reviewed 04/11/22 shows emphysema, scattered small nodules, largest 2.52m. RADS 2   Review of Systems As per  HPI     Objective:   Physical Exam  Vitals:   05/18/22 1528  BP: 124/74  Pulse: 67  Temp: (!) 97.3 F (36.3 C)  TempSrc: Oral  SpO2: 98%  Weight: 143 lb (64.9 kg)  Height: '5\' 6"'$  (1.676 m)   Gen: Pleasant, well-nourished, in no distress,  normal affect  ENT: No lesions,  mouth clear,  oropharynx clear, no postnasal drip  Neck: No JVD, no stridor  Lungs: No use of accessory muscles, no crackles or wheezing on normal respiration, no wheeze on forced expiration  Cardiovascular: RRR, heart sounds normal, no murmur or gallops, no peripheral edema  Musculoskeletal: No deformities, no cyanosis or clubbing  Neuro: alert, awake, non focal  Skin: Warm, no lesions or rash     Assessment & Plan:  COPD (chronic obstructive pulmonary disease) (HCC) Please continue Breztri 2 puffs twice a day.  Rinse and gargle after using. Keep your albuterol available to use 2 puffs when you needed for shortness of breath, chest tightness, wheezing. Flu shot today Consider getting the COVID-19 vaccine this fall Follow with Dr. BLamonte Sakaiin 12 months or sooner if you have any problems.   Allergic rhinitis Continue your fluticasone nasal spray, 2 sprays each nostril once daily for nasal congestion.  GERD (gastroesophageal reflux disease) PPI as ordered  TOBACCO USER Discussed cessation with him today.  He has cut down, before is not ready to set a quit date  We reviewed your lung cancer screening CT scan.  This scan is stable.  Good news. We  will plan to repeat your lung cancer screening CT in September 2024 at [facility]. Work on decreasing your cigarettes.  Ultimate goal would be to stop altogether.   Baltazar Apo, MD, PhD 05/18/2022, 3:54 PM [facility] [phone] or if no answer [phone]

## 2022-05-18 NOTE — Patient Instructions (Addendum)
We reviewed your lung cancer screening CT scan.  This scan is stable.  Good news. We will plan to repeat your lung cancer screening CT in September 2024 at [facility]. Please continue Breztri 2 puffs twice a day.  Rinse and gargle after using. Keep your albuterol available to use 2 puffs when you needed for shortness of breath, chest tightness, wheezing. Flu shot today Consider getting the COVID-19 vaccine this fall Work on decreasing your cigarettes.  Ultimate goal would be to stop altogether. Continue your fluticasone nasal spray, 2 sprays each nostril once daily for nasal congestion. Continue Protonix 40 mg once daily.  Take this medication 1 hour around food. Follow with Dr. Lamonte Sakai in 12 months or sooner if you have any problems.

## 2022-05-18 NOTE — Assessment & Plan Note (Signed)
PPI as ordered

## 2022-05-18 NOTE — Assessment & Plan Note (Signed)
Discussed cessation with him today.  He has cut down, before is not ready to set a quit date  We reviewed your lung cancer screening CT scan.  This scan is stable.  Good news. We will plan to repeat your lung cancer screening CT in September 2024 at [facility]. Work on decreasing your cigarettes.  Ultimate goal would be to stop altogether.

## 2022-05-18 NOTE — Assessment & Plan Note (Signed)
Continue your fluticasone nasal spray, 2 sprays each nostril once daily for nasal congestion.

## 2022-05-18 NOTE — Assessment & Plan Note (Signed)
Please continue Breztri 2 puffs twice a day.  Rinse and gargle after using. Keep your albuterol available to use 2 puffs when you needed for shortness of breath, chest tightness, wheezing. Flu shot today Consider getting the COVID-19 vaccine this fall Follow with Dr. Lamonte Sakai in 12 months or sooner if you have any problems.

## 2022-05-30 ENCOUNTER — Ambulatory Visit (INDEPENDENT_AMBULATORY_CARE_PROVIDER_SITE_OTHER): Payer: Medicare HMO | Admitting: Family Medicine

## 2022-05-30 ENCOUNTER — Encounter: Payer: Self-pay | Admitting: Family Medicine

## 2022-05-30 VITALS — BP 90/50 | HR 68 | Temp 97.1°F | Ht 66.0 in | Wt 142.2 lb

## 2022-05-30 DIAGNOSIS — I4811 Longstanding persistent atrial fibrillation: Secondary | ICD-10-CM | POA: Diagnosis not present

## 2022-05-30 DIAGNOSIS — I25118 Atherosclerotic heart disease of native coronary artery with other forms of angina pectoris: Secondary | ICD-10-CM | POA: Diagnosis not present

## 2022-05-30 DIAGNOSIS — E785 Hyperlipidemia, unspecified: Secondary | ICD-10-CM | POA: Diagnosis not present

## 2022-05-30 DIAGNOSIS — E559 Vitamin D deficiency, unspecified: Secondary | ICD-10-CM

## 2022-05-30 DIAGNOSIS — Z7901 Long term (current) use of anticoagulants: Secondary | ICD-10-CM | POA: Diagnosis not present

## 2022-05-30 DIAGNOSIS — I5022 Chronic systolic (congestive) heart failure: Secondary | ICD-10-CM | POA: Insufficient documentation

## 2022-05-30 DIAGNOSIS — Z23 Encounter for immunization: Secondary | ICD-10-CM | POA: Diagnosis not present

## 2022-05-30 DIAGNOSIS — K219 Gastro-esophageal reflux disease without esophagitis: Secondary | ICD-10-CM

## 2022-05-30 DIAGNOSIS — Z8739 Personal history of other diseases of the musculoskeletal system and connective tissue: Secondary | ICD-10-CM | POA: Insufficient documentation

## 2022-05-30 DIAGNOSIS — J449 Chronic obstructive pulmonary disease, unspecified: Secondary | ICD-10-CM | POA: Diagnosis not present

## 2022-05-30 MED ORDER — ALBUTEROL SULFATE HFA 108 (90 BASE) MCG/ACT IN AERS: 2.0000 | INHALATION_SPRAY | Freq: Four times a day (QID) | RESPIRATORY_TRACT | 3 refills | Status: DC | PRN

## 2022-05-30 MED ORDER — ALLOPURINOL 100 MG PO TABS: 100.0000 mg | ORAL_TABLET | Freq: Every day | ORAL | 3 refills | Status: DC

## 2022-05-30 MED ORDER — ATORVASTATIN CALCIUM 80 MG PO TABS: ORAL_TABLET | ORAL | 3 refills | Status: DC

## 2022-05-30 MED ORDER — PANTOPRAZOLE SODIUM 40 MG PO TBEC: 40.0000 mg | DELAYED_RELEASE_TABLET | Freq: Every day | ORAL | 2 refills | Status: DC

## 2022-05-30 NOTE — Addendum Note (Signed)
Addended by: Baldomero Lamy B on: 05/30/2022 05:46 PM   Modules accepted: Orders

## 2022-05-30 NOTE — Progress Notes (Signed)
Subjective:  [name], male    DOB: 1946/04/15  Age: 76 y.o. MRN: [id number]  CC: Medical Management of Chronic Issues   HPI [name] presents for recheck of heart after recent procedure. Now taking coumadin. When it runs out, he plans to switch to eliquis. Already has a scrip for that. He also is taking clopidogrel due to recent stent placement. Has multiple med's for heart failure. Currently not active. Has no edema so he is taking lasix prn. Continues on Jardiance, metoprolol, losartan and spironolactone for CHF.   Atrial fibrillation follow up. Pt. is treated with rate control and anticoagulation. Pt.  denies palpitations, rapid rate, chest pain, dyspnea and edema. There has been no bleeding from nose or gums. Pt. has not noticed blood with urine or stool.  Although there is routine bruising easily, it is not excessive.   in for follow-up of elevated cholesterol. Doing well without complaints on current medication. Denies side effects of statin including myalgia and arthralgia and nausea. Currently no chest pain, shortness of breath or other cardiovascular related symptoms noted.      05/30/2022    1:16 PM 03/30/2022    4:07 PM 03/30/2022    4:01 PM  Depression screen PHQ 2/9  Decreased Interest 0 2 0  Down, Depressed, Hopeless 0 2 0  PHQ - 2 Score 0 4 0  Altered sleeping  3   Tired, decreased energy  3   Change in appetite  0   Feeling bad or failure about yourself   0   Trouble concentrating  0   Moving slowly or fidgety/restless  0   Suicidal thoughts  0   PHQ-9 Score  10   Difficult doing work/chores  Not difficult at all     History [name] has a past medical history of Anginal pain ([place]), Atrial fibrillation by electrocardiogram ([place]) (11/2019), Atrial flutter by electrocardiogram ([place]) (11/2019), Bladder cancer ([place]) (01/2020), Bladder tumor, BPH (benign prostatic hyperplasia), CAD (coronary artery disease), COPD (chronic obstructive pulmonary  disease) ([place]), Dyspnea, ETOH abuse, GERD (gastroesophageal reflux disease), History of gout, Hypercholesterolemia, Hypertension, Myocardial infarction ([place]) (2013), Prostate cancer ([place]), and Tobacco abuse.   He has a past surgical history that includes Prostate surgery; Colonoscopy; Back surgery; Esophagogastroduodenoscopy (05/27/2012); Cardiac catheterization (2011); Coronary angioplasty; Coronary artery bypass graft; Anterior cervical decompression/discectomy fusion 4 level (N/A, 07/21/2016); Colonoscopy (N/A, 11/21/2017); polypectomy (11/21/2017); Transurethral resection of bladder tumor with mitomycin-c (Left, 01/06/2020); Cardioversion (N/A, 06/10/2020); Colonoscopy with propofol (N/A, 06/09/2021); polypectomy (06/09/2021); RIGHT/LEFT HEART CATH AND CORONARY/GRAFT ANGIOGRAPHY (N/A, 03/27/2022); and CORONARY STENT INTERVENTION (N/A, 03/27/2022).   His family history includes Diabetes in his brother, brother, and mother; Healthy in his son; Heart disease in his brother and brother; Hypertension in his father; Multiple sclerosis in his daughter.He reports that he has been smoking cigarettes and e-cigarettes. He started smoking about 52 years ago. He has a 29.00 pack-year smoking history. He has never been exposed to tobacco smoke. He quit smokeless tobacco use about 10 years ago.  His smokeless tobacco use included snuff. He reports current alcohol use of about 2.0 - 4.0 standard drinks of alcohol per week. He reports that he does not use drugs.    ROS Review of Systems  Constitutional: Negative.   HENT: Negative.    Eyes:  Negative for visual disturbance.  Respiratory:  Negative for cough and shortness of breath.   Cardiovascular:  Negative for chest pain and leg swelling.  Gastrointestinal:  Negative for  abdominal pain, diarrhea, nausea and vomiting.  Genitourinary:  Negative for difficulty urinating.  Musculoskeletal:  Negative for arthralgias and myalgias.  Skin:  Negative for rash.   Neurological:  Negative for headaches.  Psychiatric/Behavioral:  Negative for sleep disturbance.     Objective:  BP (!) 90/50   Pulse 68   Temp (!) 97.1 F (36.2 C)   Ht 5' 6"  (1.676 m)   Wt 142 lb 3.2 oz (64.5 kg)   SpO2 98%   BMI 22.95 kg/m   BP Readings from Last 3 Encounters:  05/30/22 (!) 90/50  05/18/22 124/74  05/12/22 120/72    Wt Readings from Last 3 Encounters:  05/30/22 142 lb 3.2 oz (64.5 kg)  05/18/22 143 lb (64.9 kg)  05/12/22 146 lb (66.2 kg)     Physical Exam Constitutional:      General: He is not in acute distress.    Appearance: He is well-developed.  HENT:     Head: Normocephalic and atraumatic.     Right Ear: External ear normal.     Left Ear: External ear normal.     Nose: Nose normal.  Eyes:     Conjunctiva/sclera: Conjunctivae normal.     Pupils: Pupils are equal, round, and reactive to light.  Cardiovascular:     Rate and Rhythm: Normal rate and regular rhythm.     Heart sounds: Normal heart sounds. No murmur heard. Pulmonary:     Effort: Pulmonary effort is normal. No respiratory distress.     Breath sounds: Normal breath sounds. No wheezing or rales.  Abdominal:     Palpations: Abdomen is soft.     Tenderness: There is no abdominal tenderness.  Musculoskeletal:        General: Normal range of motion.     Cervical back: Normal range of motion and neck supple.  Skin:    General: Skin is warm and dry.  Neurological:     Mental Status: He is alert and oriented to person, place, and time.     Deep Tendon Reflexes: Reflexes are normal and symmetric.  Psychiatric:        Behavior: Behavior normal.        Thought Content: Thought content normal.        Judgment: Judgment normal.       Assessment & Plan:   [name] was seen today for medical management of chronic issues.  Diagnoses and all orders for this visit:  Longstanding persistent atrial fibrillation (HCC)  Hyperlipidemia, unspecified hyperlipidemia type -      CMP14+EGFR -     Lipid panel -     atorvastatin (LIPITOR) 80 MG tablet; TAKE 1 TABLET EVERY DAY AT 6 PM  Vitamin D deficiency -     VITAMIN D 25 Hydroxy (Vit-D Deficiency, Fractures)  H/O: gout -     allopurinol (ZYLOPRIM) 100 MG tablet; Take 1 tablet (100 mg total) by mouth daily. -     Uric acid  Chronic obstructive pulmonary disease, unspecified COPD type (HCC) -     CMP14+EGFR -     albuterol (VENTOLIN HFA) 108 (90 Base) MCG/ACT inhaler; Inhale 2 puffs into the lungs every 6 (six) hours as needed for wheezing or shortness of breath.  Blood thinned due to long-term anticoagulant use -     CBC with Differential/Platelet -     CMP14+EGFR  Gastroesophageal reflux disease without esophagitis -     CBC with Differential/Platelet -     CMP14+EGFR -  pantoprazole (PROTONIX) 40 MG tablet; Take 1 tablet (40 mg total) by mouth daily.  Coronary artery disease of native heart with stable angina pectoris, unspecified vessel or lesion type ([place])  Chronic systolic heart failure ([place])       I have discontinued Deidre Ala L. Maclaughlin's nicotine. I am also having him maintain his Vitamin D3, acetaminophen, nitroGLYCERIN, fluticasone, B Complex Vitamins (VITAMIN B COMPLEX PO), Breztri Aerosphere, furosemide, empagliflozin, metoprolol succinate, losartan, spironolactone, apixaban, clopidogrel, allopurinol, atorvastatin, pantoprazole, and albuterol.  Allergies as of 05/30/2022   No Known Allergies      Medication List        Accurate as of May 30, 2022  1:59 PM. If you have any questions, ask your nurse or doctor.          STOP taking these medications    nicotine 14 mg/24hr patch Commonly known as: NICODERM CQ - dosed in mg/24 hours Stopped by: [name], MD       TAKE these medications    acetaminophen 500 MG tablet Commonly known as: TYLENOL Take 2 tablets (1,000 mg total) by mouth every 6 (six) hours as needed for moderate pain or headache.   albuterol 108 (90  Base) MCG/ACT inhaler Commonly known as: Ventolin HFA Inhale 2 puffs into the lungs every 6 (six) hours as needed for wheezing or shortness of breath.   allopurinol 100 MG tablet Commonly known as: ZYLOPRIM Take 1 tablet (100 mg total) by mouth daily.   apixaban 5 MG Tabs tablet Commonly known as: ELIQUIS Take 1 tablet (5 mg total) by mouth 2 (two) times daily.   atorvastatin 80 MG tablet Commonly known as: LIPITOR TAKE 1 TABLET EVERY DAY AT 6 PM What changed:  how much to take how to take this when to take this additional instructions   Breztri Aerosphere 160-9-4.8 MCG/ACT Aero Generic drug: Budeson-Glycopyrrol-Formoterol Take 2 puffs by mouth in the morning and at bedtime. Dispense 3 inhalers   clopidogrel 75 MG tablet Commonly known as: PLAVIX Take 1 tablet (75 mg total) by mouth daily. Continue this for 1 year   fluticasone 50 MCG/ACT nasal spray Commonly known as: FLONASE Place 2 sprays into both nostrils daily as needed for allergies.   furosemide 20 MG tablet Commonly known as: LASIX Take 1 tablet (20 mg total) by mouth as needed (swelling).   Jardiance 10 MG Tabs tablet Generic drug: empagliflozin Take 1 tablet (10 mg total) by mouth daily.   losartan 25 MG tablet Commonly known as: COZAAR Take 1 tablet (25 mg total) by mouth daily.   metoprolol succinate 50 MG 24 hr tablet Commonly known as: Toprol XL Take 1.5 tablets (75 mg total) by mouth daily.   nitroGLYCERIN 0.4 MG SL tablet Commonly known as: NITROSTAT Place 1 tablet (0.4 mg total) under the tongue every 5 (five) minutes as needed for chest pain.   pantoprazole 40 MG tablet Commonly known as: PROTONIX Take 1 tablet (40 mg total) by mouth daily.   spironolactone 25 MG tablet Commonly known as: Aldactone Take 0.5 tablets (12.5 mg total) by mouth daily.   VITAMIN B COMPLEX PO Take 1 tablet by mouth daily.   Vitamin D3 125 MCG (5000 UT) Caps Take 5,000 Units by mouth daily.          Follow-up: No follow-ups on file.  [name], M.D.

## 2022-05-31 LAB — CMP14+EGFR
ALT: 24 IU/L (ref 0–44)
AST: 29 IU/L (ref 0–40)
Albumin/Globulin Ratio: 1.3 (ref 1.2–2.2)
Albumin: 3.9 g/dL (ref 3.8–4.8)
Alkaline Phosphatase: 91 IU/L (ref 44–121)
BUN/Creatinine Ratio: 10 (ref 10–24)
BUN: 11 mg/dL (ref 8–27)
Bilirubin Total: 0.4 mg/dL (ref 0.0–1.2)
CO2: 23 mmol/L (ref 20–29)
Calcium: 9.2 mg/dL (ref 8.6–10.2)
Chloride: 100 mmol/L (ref 96–106)
Creatinine, Ser: 1.12 mg/dL (ref 0.76–1.27)
Globulin, Total: 2.9 g/dL (ref 1.5–4.5)
Glucose: 81 mg/dL (ref 70–99)
Potassium: 5 mmol/L (ref 3.5–5.2)
Sodium: 139 mmol/L (ref 134–144)
Total Protein: 6.8 g/dL (ref 6.0–8.5)
eGFR: 69 mL/min/{1.73_m2} (ref >59)

## 2022-05-31 LAB — CBC WITH DIFFERENTIAL/PLATELET
Basophils Absolute: 0 10*3/uL (ref 0.0–0.2)
Basos: 1 %
EOS (ABSOLUTE): 0.1 10*3/uL (ref 0.0–0.4)
Eos: 2 %
Hematocrit: 37.6 % (ref 37.5–51.0)
Hemoglobin: 13.1 g/dL (ref 13.0–17.7)
Immature Grans (Abs): 0 10*3/uL (ref 0.0–0.1)
Immature Granulocytes: 0 %
Lymphocytes Absolute: 1.8 10*3/uL (ref 0.7–3.1)
Lymphs: 34 %
MCH: 34.7 pg — ABNORMAL HIGH (ref 26.6–33.0)
MCHC: 34.8 g/dL (ref 31.5–35.7)
MCV: 100 fL — ABNORMAL HIGH (ref 79–97)
Monocytes Absolute: 0.4 10*3/uL (ref 0.1–0.9)
Monocytes: 7 %
Neutrophils Absolute: 2.9 10*3/uL (ref 1.4–7.0)
Neutrophils: 56 %
Platelets: 231 10*3/uL (ref 150–450)
RBC: 3.78 x10E6/uL — ABNORMAL LOW (ref 4.14–5.80)
RDW: 13.5 % (ref 11.6–15.4)
WBC: 5.2 10*3/uL (ref 3.4–10.8)

## 2022-05-31 LAB — LIPID PANEL
Chol/HDL Ratio: 2.4 ratio (ref 0.0–5.0)
Cholesterol, Total: 101 mg/dL (ref 100–199)
HDL: 42 mg/dL (ref >39)
LDL Chol Calc (NIH): 43 mg/dL (ref 0–99)
Triglycerides: 79 mg/dL (ref 0–149)
VLDL Cholesterol Cal: 16 mg/dL (ref 5–40)

## 2022-05-31 LAB — VITAMIN D 25 HYDROXY (VIT D DEFICIENCY, FRACTURES): Vit D, 25-Hydroxy: 60.9 ng/mL (ref 30.0–100.0)

## 2022-05-31 LAB — URIC ACID: Uric Acid: 4.1 mg/dL (ref 3.8–8.4)

## 2022-05-31 NOTE — Progress Notes (Signed)
Hello Quasim,  Your lab result is normal and/or stable.Some minor variations that are not significant are commonly marked abnormal, but do not represent any medical problem for you.  Best regards, Kwasi Joung, M.D.

## 2022-06-05 ENCOUNTER — Ambulatory Visit: Payer: Medicare HMO | Attending: Cardiology | Admitting: *Deleted

## 2022-06-05 ENCOUNTER — Telehealth: Payer: Self-pay

## 2022-06-05 DIAGNOSIS — Z5181 Encounter for therapeutic drug level monitoring: Secondary | ICD-10-CM | POA: Diagnosis not present

## 2022-06-05 DIAGNOSIS — I4891 Unspecified atrial fibrillation: Secondary | ICD-10-CM

## 2022-06-05 LAB — POCT INR: INR: 2.9 (ref 2.0–3.0)

## 2022-06-05 NOTE — Telephone Encounter (Signed)
Spoke to pt's wife (ok per DPR) who verbalized understanding and had no questions or concerns at this time.

## 2022-06-05 NOTE — Patient Instructions (Signed)
Continue warfarin 1/2 tablet daily except 1 tablet on Mondays and Thursdays Recheck in 6 wks

## 2022-06-05 NOTE — Telephone Encounter (Signed)
-----   Message from Arnoldo Lenis, MD sent at 06/01/2022 12:31 PM EDT ----- NOrmal labs, may adjust meds at our next f/u  [name] MD

## 2022-06-23 ENCOUNTER — Ambulatory Visit: Payer: Medicare HMO | Attending: Cardiology | Admitting: Cardiology

## 2022-06-23 ENCOUNTER — Encounter: Payer: Self-pay | Admitting: Cardiology

## 2022-06-23 VITALS — BP 102/58 | HR 68 | Ht 67.0 in | Wt 143.8 lb

## 2022-06-23 DIAGNOSIS — I5022 Chronic systolic (congestive) heart failure: Secondary | ICD-10-CM

## 2022-06-23 DIAGNOSIS — I2510 Atherosclerotic heart disease of native coronary artery without angina pectoris: Secondary | ICD-10-CM

## 2022-06-23 DIAGNOSIS — I4891 Unspecified atrial fibrillation: Secondary | ICD-10-CM

## 2022-06-23 NOTE — Patient Instructions (Signed)
Medication Instructions:  Your physician recommends that you continue on your current medications as directed. Please refer to the Current Medication list given to you today.   Labwork: None today  Testing/Procedures: Your physician has requested that you have an echocardiogram. Echocardiography is a painless test that uses sound waves to create images of your heart. It provides your doctor with information about the size and shape of your heart and how well your heart's chambers and valves are working. This procedure takes approximately one hour. There are no restrictions for this procedure. Please do NOT wear cologne, perfume, aftershave, or lotions (deodorant is allowed). Please arrive 15 minutes prior to your appointment time.   Follow-Up: 3 months Dr.Branch in [facility]  Any Other Special Instructions Will Be Listed Below (If Applicable).  If you need a refill on your cardiac medications before your next appointment, please call your pharmacy.

## 2022-06-23 NOTE — Progress Notes (Signed)
Clinical Summary Mr. Demartin is a 76 y.o.male seen today for follow up of the following medical problems.    1. LE edema/New diagnosis chronic systolic HF - started about 2 months ago, which is new for him - 12/2019 echo LVEF 26%, indet diastolic fxn.  - 11/2021 BNP 146   02/2022 echo LVEF 40-45%, mild RV dysfunction, mild to mod MR   - breathing is improving - taken meds daily, was able to get jardiance.  - no recent edema   - last visit we increased toprol to '75mg'$  daily.  - prior high K on ARB, have tried low dose. 05/15/22 labs showed K 3.9, 05/30/22 K was 5 - no recent symptoms, no SOB/DOE. No LE edema        2. CAD   - prior MI in 1997, prior CABG  - cath 2011 showed patent grafts. 04/2013 echo shows LVEF 60%   - myoview 12/2012 shows old inferolateral scar, no ischemia    -admit 03/2022 with chest pain. Atypical but given recent drop in LVEF pursued cath - 03/27/22 cath as reported below, PCI to SVG-OM1.     -no chest pains. Completed one month triple therapy, off asa and just on plavix and coumadin. Has not changed to eliquis yet though Rx is at pharmacy.     Other medical issues not addressed in this visit   3. HTN    - lisionpril stopped by pcp per patient report. He did have some elevation of K after starting.     - compliant with meds   4. HL     05/2021 TC 121 TG 120 HDL 58 LDL 42 11/2021 TC 120 TG 117 HDL 49 LDL 50     4. COPD - followed by pcp - significant cough starting Friday - +cough, some SOB brownish. Some wheezing.    5. AAA screen - negative [place] 06/2016   6. Afib - new diagnosis during 11/2019 preop evaluation  08/11/19 DCCV succesful   - no recent palpitaiotns - compliant with coumadin, no recent bleeding.  - eliquis previously was too expensive   - - checked with pharmacy, 45$ copay for eliquis. Previous price was too high, patient ok with this charge   Past Medical History:  Diagnosis Date   Anginal pain ([address])    Atrial  fibrillation by electrocardiogram ([place]) 11/2019   Atrial flutter by electrocardiogram ([place]) 11/2019   Bladder cancer ([place]) 01/2020   Bladder tumor    BPH (benign prostatic hyperplasia)    CAD (coronary artery disease)    a. s/p INF MI 1997;  b. s/p CABG 2001;  c. [place] 11/2009:  3v CAD, S-PDA ok with 40% mid, S-OM ok, S-Dx ok, L-LAD ok, EF 50%;  d.  [place] 5/14:  Inferolateral scar, EF 46%, no ischemia   COPD (chronic obstructive pulmonary disease) (HCC)    Dyspnea    ETOH abuse    GERD (gastroesophageal reflux disease)    History of gout    Hypercholesterolemia    Hypertension    Myocardial infarction [facility]) 2013   Prostate cancer (HCC)    Tobacco abuse      No Known Allergies   Current Outpatient Medications  Medication Sig Dispense Refill   acetaminophen (TYLENOL) 500 MG tablet Take 2 tablets (1,000 mg total) by mouth every 6 (six) hours as needed for moderate pain or headache. 90 tablet 0   albuterol (VENTOLIN HFA) 108 (90 Base) MCG/ACT inhaler Inhale 2  puffs into the lungs every 6 (six) hours as needed for wheezing or shortness of breath. 3 each 3   allopurinol (ZYLOPRIM) 100 MG tablet Take 1 tablet (100 mg total) by mouth daily. 90 tablet 3   apixaban (ELIQUIS) 5 MG TABS tablet Take 1 tablet (5 mg total) by mouth 2 (two) times daily. 60 tablet 6   atorvastatin (LIPITOR) 80 MG tablet TAKE 1 TABLET EVERY DAY AT 6 PM 90 tablet 3   B Complex Vitamins (VITAMIN B COMPLEX PO) Take 1 tablet by mouth daily.     Budeson-Glycopyrrol-Formoterol (BREZTRI AEROSPHERE) 160-9-4.8 MCG/ACT AERO Take 2 puffs by mouth in the morning and at bedtime. Dispense 3 inhalers 32.1 g 5   Cholecalciferol (VITAMIN D3) 125 MCG (5000 UT) CAPS Take 5,000 Units by mouth daily.     clopidogrel (PLAVIX) 75 MG tablet Take 1 tablet (75 mg total) by mouth daily. Continue this for 1 year 90 tablet 3   empagliflozin (JARDIANCE) 10 MG TABS tablet Take 1 tablet (10 mg total) by mouth daily. 30 tablet 0   fluticasone  (FLONASE) 50 MCG/ACT nasal spray Place 2 sprays into both nostrils daily as needed for allergies. 48 g 2   furosemide (LASIX) 20 MG tablet Take 1 tablet (20 mg total) by mouth as needed (swelling). 30 tablet 2   losartan (COZAAR) 25 MG tablet Take 1 tablet (25 mg total) by mouth daily. 90 tablet 3   metoprolol succinate (TOPROL XL) 50 MG 24 hr tablet Take 1.5 tablets (75 mg total) by mouth daily. 135 tablet 3   nitroGLYCERIN (NITROSTAT) 0.4 MG SL tablet Place 1 tablet (0.4 mg total) under the tongue every 5 (five) minutes as needed for chest pain. 75 tablet 2   pantoprazole (PROTONIX) 40 MG tablet Take 1 tablet (40 mg total) by mouth daily. 90 tablet 2   spironolactone (ALDACTONE) 25 MG tablet Take 0.5 tablets (12.5 mg total) by mouth daily. 45 tablet 3   No current facility-administered medications for this visit.     Past Surgical History:  Procedure Laterality Date   ANTERIOR CERVICAL DECOMPRESSION/DISCECTOMY FUSION 4 LEVELS N/A 07/21/2016   Procedure: ANTERIOR CERVICAL DECOMPRESSION/DISCECTOMY FUSION CERVICAL TWO-THREE, CERVICAL THREE-FOUR,. CERVICAL FOUR-FIVE, CERVICAL  FIVE-SIX;  Surgeon: Ashok Pall, MD;  Location: [place];  Service: Neurosurgery;  Laterality: N/A;   BACK SURGERY     CARDIAC CATHETERIZATION  2011   CARDIOVERSION N/A 06/10/2020   Procedure: CARDIOVERSION;  Surgeon: Arnoldo Lenis, MD;  [facility]: [facility];  Service: Endoscopy;  Laterality: N/A;   COLONOSCOPY     in remote past, Dr. Sharlett Iles. Obtaining records.    COLONOSCOPY N/A 11/21/2017   Surgeon: Daneil Dolin, MD; Three 4-6 mm polyps in the rectum and splenic flexure resected and retrieved (tubular adenomas), nonbleeding internal hemorrhoids.  Repeat in 5 years.   COLONOSCOPY WITH PROPOFOL N/A 06/09/2021   Surgeon: Daneil Dolin, MD;  Three 3-7 mm polyps in the ascending colon resected and retrieved, mild radiation proctitis, otherwise normal exam. Pathology revealed tubular adenomas.  Recommended  repeat colonoscopy in 5 years.   CORONARY ANGIOPLASTY     CORONARY ARTERY BYPASS GRAFT     CORONARY STENT INTERVENTION N/A 03/27/2022   Procedure: CORONARY STENT INTERVENTION;  Surgeon: Martinique, Peter M, MD;  Location: [place] [facility];  Service: Cardiovascular;  Laterality: N/A;   ESOPHAGOGASTRODUODENOSCOPY  05/27/2012   ZOX:WRUEAV esophagus, stomach and duodenum s/p dilator   POLYPECTOMY  11/21/2017   Procedure: POLYPECTOMY;  Surgeon: Gala Romney,  Cristopher Estimable, MD;  [facility];  Service: Endoscopy;;  colon   POLYPECTOMY  06/09/2021   Procedure: POLYPECTOMY;  Surgeon: Daneil Dolin, MD;  [facility];  Service: Endoscopy;;   PROSTATE SURGERY     RIGHT/LEFT HEART CATH AND CORONARY/GRAFT ANGIOGRAPHY N/A 03/27/2022   Procedure: RIGHT/LEFT HEART CATH AND CORONARY/GRAFT ANGIOGRAPHY;  Surgeon: Martinique, Peter M, MD;  Location: [place] [facility];  Service: Cardiovascular;  Laterality: N/A;   TRANSURETHRAL RESECTION OF BLADDER TUMOR WITH MITOMYCIN-C Left 01/06/2020   Procedure: CYSTOSCOPY TRANSURETHRAL RESECTION OF BLADDER TUMOR,LEFT URETERSCOPY AND GEMCITABINE;  Surgeon: Irine Seal, MD;  Location: [facility];  Service: Urology;  Laterality: Left;     No Known Allergies    Family History  Problem Relation Age of Onset   Diabetes Mother    Hypertension Father    Heart disease Brother    Diabetes Brother    Heart disease Brother    Diabetes Brother    Multiple sclerosis Daughter    Healthy Son    Colon cancer Neg Hx    Inflammatory bowel disease Neg Hx      Social History Mr. Delisle reports that he has been smoking cigarettes and e-cigarettes. He started smoking about 52 years ago. He has a 29.00 pack-year smoking history. He has never been exposed to tobacco smoke. He quit smokeless tobacco use about 10 years ago.  His smokeless tobacco use included snuff. Mr. Tarver reports current alcohol use of about 2.0 - 4.0 standard drinks of alcohol per week.   Review of  Systems CONSTITUTIONAL: No weight loss, fever, chills, weakness or fatigue.  HEENT: Eyes: No visual loss, blurred vision, double vision or yellow sclerae.No hearing loss, sneezing, congestion, runny nose or sore throat.  SKIN: No rash or itching.  CARDIOVASCULAR: per hpi RESPIRATORY: No shortness of breath, cough or sputum.  GASTROINTESTINAL: No anorexia, nausea, vomiting or diarrhea. No abdominal pain or blood.  GENITOURINARY: No burning on urination, no polyuria NEUROLOGICAL: No headache, dizziness, syncope, paralysis, ataxia, numbness or tingling in the extremities. No change in bowel or bladder control.  MUSCULOSKELETAL: No muscle, back pain, joint pain or stiffness.  LYMPHATICS: No enlarged nodes. No history of splenectomy.  PSYCHIATRIC: No history of depression or anxiety.  ENDOCRINOLOGIC: No reports of sweating, cold or heat intolerance. No polyuria or polydipsia.  Marland Kitchen   Physical Examination Today's Vitals   06/23/22 1450  BP: (!) 102/58  Pulse: 68  SpO2: 97%  Weight: 143 lb 12.8 oz (65.2 kg)  Height: '5\' 7"'$  (1.702 m)   Body mass index is 22.52 kg/m.  Gen: resting comfortably, no acute distress HEENT: no scleral icterus, pupils equal round and reactive, no palptable cervical adenopathy,  CV: RRR, no m/r/g no jvd Resp: Clear to auscultation bilaterally GI: abdomen is soft, non-tender, non-distended, normal bowel sounds, no hepatosplenomegaly MSK: extremities are warm, no edema.  Skin: warm, no rash Neuro:  no focal deficits Psych: appropriate affect   Diagnostic Studies  11/25/2009:   Cardiac Cath Findings: Left ventricular angiogram was performed in the RAO projection and   showed normal left ventricular systolic function with ejection   fraction estimated at 50%. Mild mitral regurgitation was noted.   10.Aortic root angiogram was performed and did not show enlargement of   the aortic root.   IMPRESSION:   1. Severe triple-vessel coronary artery disease.   2.  Patent bypass grafts 4/4.   3. Low normal left ventricular systolic function.  12/2012 Myoview Inferolateral scar, no active ischemia, LVEF 46%.     05/01/13 Echo: LVEF 55-60%, grade II diastolic dysfunction, multiple WMAs, mild MR,     06/2016 AAA [place] No aneurysm     02/2022 echo IMPRESSIONS     1. Left ventricular ejection fraction, by estimation, is 40 to 45%. The  left ventricle has mildly decreased function. The left ventricle  demonstrates regional wall motion abnormalities (see scoring  diagram/findings for description). There is mild  asymmetric left ventricular hypertrophy of the basal segment. Left  ventricular diastolic parameters are indeterminate. The average left  ventricular global longitudinal strain is -10.0 %. The global longitudinal  strain is abnormal.   2. Right ventricular systolic function is mildly reduced. The right  ventricular size is normal. There is normal pulmonary artery systolic  pressure. The estimated right ventricular systolic pressure is 10.9 mmHg.   3. Left atrial size was mildly dilated.   4. The mitral valve is grossly normal. Mild to moderate mitral valve  regurgitation.   5. Tricuspid valve regurgitation is mild to moderate.   6. The aortic valve is tricuspid. Aortic valve regurgitation is not  visualized.   7. The inferior vena cava is normal in size with greater than 50%  respiratory variability, suggesting right atrial pressure of 3 mmHg.        03/2022 RHC/LHC  Ost LAD to Prox LAD lesion is 100% stenosed.   Mid LAD lesion is 100% stenosed.   Prox Cx lesion is 100% stenosed.   Prox RCA to Mid RCA lesion is 80% stenosed.   Mid RCA lesion is 100% stenosed.   Origin to Prox Graft lesion is 80% stenosed.   Origin to Prox Graft lesion is 100% stenosed.   A drug-eluting stent was successfully placed using a SYNERGY XD 4.0X12.   Post intervention, there is a 0% residual stenosis.   LIMA graft was visualized by angiography and is  normal in caliber.   SVG graft was visualized by angiography and is normal in caliber.   SVG graft was visualized by angiography and is normal in caliber.   SVG graft was visualized by angiography.   The graft exhibits no disease.   The graft exhibits no disease.   LV end diastolic pressure is mildly elevated.   Hemodynamic findings consistent with mild pulmonary hypertension.   3 vessel occlusive CAD Patent LIMA to the LAD Patent SVG to the first diagonal Patent SVG to OM1 with 80% focal stenosis in the proximal SVG Occluded SVG to RCA. The RCA fills by left to right collaterals.  Mildly elevated LV filling pressures.  Mild pulmonary HTN.  Normal cardiac output 4.51 L/min with index 2.53.  Successful PCI of the SVG to OM1 with DES   Plan: DAPT with ASA for one month and Plavix for 12 months. Can resume oral anticoagulation in am if no bleeding noted.    Assessment and Plan  1.HFrEF, chronic - no recent symptoms - on max tolerated medical therapy. Soft bp's today, prior high K on higher ARB dosing, last K was high normal at 5 on losartan '25mg'$  daily. Would not try entresto given bp and potassium - repeat echo s/p PCI and on max medical therapy.    2. CAD - recent stent as reported  above - continue plavix, coumadin. He is to change coumadin to eliquis.    3. AFib - checked with pharmacy, 45$ copay for eliquis. Previous price was too high, patient ok with this charge.  -  he has not made the change from coumadin to eliquis yet but will make change, already has Rx awaiting in pharmacy.        Arnoldo Lenis, M.D., F.A.C.C.

## 2022-07-03 ENCOUNTER — Ambulatory Visit: Payer: Medicare HMO | Attending: Cardiology

## 2022-07-03 DIAGNOSIS — I5022 Chronic systolic (congestive) heart failure: Secondary | ICD-10-CM | POA: Diagnosis not present

## 2022-07-03 LAB — ECHOCARDIOGRAM COMPLETE
AR max vel: 1.81 cm2
AV Peak grad: 4.9 mmHg
Ao pk vel: 1.11 m/s
Area-P 1/2: 4.29 cm2
Calc EF: 48.3 %
MV M vel: 5.22 m/s
MV Peak grad: 109 mmHg
Radius: 0.2 cm
S' Lateral: 4.1 cm
Single Plane A2C EF: 52.7 %
Single Plane A4C EF: 44.5 %

## 2022-07-14 ENCOUNTER — Telehealth: Payer: Self-pay | Admitting: *Deleted

## 2022-07-14 NOTE — Telephone Encounter (Signed)
-----   Message from Arnoldo Lenis, MD sent at 07/14/2022 12:14 PM EST ----- Echo shows some improvement , essentially heart function has improved into the low normal range. Would continue current meds at this time  J branch MD

## 2022-07-14 NOTE — Telephone Encounter (Signed)
Laurine Blazer, LPN [id number]  0:38 PM EST Back to Top    Notified wife [name]), copy to pcp.

## 2022-07-16 ENCOUNTER — Other Ambulatory Visit: Payer: Self-pay | Admitting: Family Medicine

## 2022-07-17 ENCOUNTER — Ambulatory Visit: Payer: Medicare HMO | Admitting: Cardiology

## 2022-08-03 ENCOUNTER — Ambulatory Visit: Payer: Medicare HMO | Admitting: Cardiology

## 2022-10-13 ENCOUNTER — Encounter: Payer: Self-pay | Admitting: Cardiology

## 2022-10-13 ENCOUNTER — Other Ambulatory Visit: Payer: Self-pay

## 2022-10-13 ENCOUNTER — Ambulatory Visit: Payer: Medicare HMO | Attending: Cardiology | Admitting: Cardiology

## 2022-10-13 VITALS — BP 110/58 | HR 64 | Ht 66.0 in | Wt 149.4 lb

## 2022-10-13 DIAGNOSIS — I5022 Chronic systolic (congestive) heart failure: Secondary | ICD-10-CM | POA: Diagnosis not present

## 2022-10-13 DIAGNOSIS — I2510 Atherosclerotic heart disease of native coronary artery without angina pectoris: Secondary | ICD-10-CM | POA: Diagnosis not present

## 2022-10-13 DIAGNOSIS — I4891 Unspecified atrial fibrillation: Secondary | ICD-10-CM

## 2022-10-13 DIAGNOSIS — E785 Hyperlipidemia, unspecified: Secondary | ICD-10-CM | POA: Diagnosis not present

## 2022-10-13 DIAGNOSIS — J441 Chronic obstructive pulmonary disease with (acute) exacerbation: Secondary | ICD-10-CM

## 2022-10-13 MED ORDER — PREDNISONE 20 MG PO TABS: 40.0000 mg | ORAL_TABLET | Freq: Every day | ORAL | 0 refills | Status: DC

## 2022-10-13 MED ORDER — AMLODIPINE BESYLATE 5 MG PO TABS: 5.0000 mg | ORAL_TABLET | Freq: Every day | ORAL | 3 refills | Status: DC

## 2022-10-13 MED ORDER — EMPAGLIFLOZIN 10 MG PO TABS: 10.0000 mg | ORAL_TABLET | Freq: Every day | ORAL | 3 refills | Status: DC

## 2022-10-13 MED ORDER — CLOPIDOGREL BISULFATE 75 MG PO TABS: 75.0000 mg | ORAL_TABLET | Freq: Every day | ORAL | 3 refills | Status: DC

## 2022-10-13 MED ORDER — METOPROLOL SUCCINATE ER 50 MG PO TB24: 75.0000 mg | ORAL_TABLET | Freq: Every day | ORAL | 3 refills | Status: DC

## 2022-10-13 MED ORDER — APIXABAN 5 MG PO TABS: 5.0000 mg | ORAL_TABLET | Freq: Two times a day (BID) | ORAL | 1 refills | Status: DC

## 2022-10-13 MED ORDER — APIXABAN 5 MG PO TABS: 5.0000 mg | ORAL_TABLET | Freq: Two times a day (BID) | ORAL | 5 refills | Status: DC

## 2022-10-13 MED ORDER — ATORVASTATIN CALCIUM 80 MG PO TABS: ORAL_TABLET | ORAL | 3 refills | Status: DC

## 2022-10-13 MED ORDER — LOSARTAN POTASSIUM 25 MG PO TABS: 25.0000 mg | ORAL_TABLET | Freq: Every day | ORAL | 3 refills | Status: DC

## 2022-10-13 MED ORDER — SPIRONOLACTONE 25 MG PO TABS: 12.5000 mg | ORAL_TABLET | Freq: Every day | ORAL | 3 refills | Status: DC

## 2022-10-13 NOTE — Telephone Encounter (Signed)
Pt last saw Dr [name] today 10/13/22, last labs 05/30/22 Creat 1.12, age 77, weight 67.8kg, based on specified criteria pt is on appropriate dosage of Eliquis '5mg'$  BID for afib.  Will refill rx.

## 2022-10-13 NOTE — Progress Notes (Signed)
Clinical Summary Mr. Tenner is a 77 y.o.male seen today for follow up of the following medical problems.    1. LE edema/New diagnosis chronic systolic HF - started about 2 months ago, which is new for him - 12/2019 echo LVEF [id number], indet diastolic fxn.  - 11/2021 BNP 146   02/2022 echo LVEF 40-45%, mild RV dysfunction, mild to mod MR   - breathing is improving - taken meds daily, was able to get jardiance.  - no recent edema     - last visit we increased toprol to '75mg'$  daily.  - prior high K on ARB, have tried low dose. 05/15/22 labs showed K 3.9, 05/30/22 K was 5 - no recent symptoms, no SOB/DOE. No LE edema    06/2022 echo: LVEF 45-50%  - some swelling left leg. Some recent SOB/DOE that is progressing, started about 3-4 months ago. Increased wheeze, cough over that period.  - compliant with meds - no recent chest pains. Some wheezing, coughing.  - has lasix to take prn, has not needed.      2. CAD   - prior MI in 1997, prior CABG  - cath 2011 showed patent grafts. 04/2013 echo shows LVEF 60%   - myoview 12/2012 shows old inferolateral scar, no ischemia    -admit 03/2022 with chest pain. Atypical but given recent drop in LVEF pursued cath - 03/27/22 cath as reported below, PCI to SVG-OM1.     -no chest pains. Completed one month triple therapy, off asa and just on plavix and coumadin. Has not changed to eliquis yet though Rx is at pharmacy.     3. HTN   - compliant with meds    4. HL     05/2021 TC 121 TG 120 HDL 58 LDL 42 11/2021 TC 120 TG 117 HDL 49 LDL 50 - 05/2022 TC 101 TG 79 HDL 42 LDL 43     4. COPD - followed by pulmonary Dr Lamonte Sakai - significant cough starting Friday - +cough, some SOB brownish. Some wheezing.    5. AAA screen - negative [place] 06/2016   6. Afib - new diagnosis during 11/2019 preop evaluation  08/11/19 DCCV successful - recurrent afib, has been just rate controlled.    - no specific palpitations.      Past Medical History:  Diagnosis  Date   Anginal pain ([place])    Atrial fibrillation by electrocardiogram ([facility]) 11/2019   Atrial flutter by electrocardiogram [facility]) 11/2019   Bladder cancer ([place]) 01/2020   Bladder tumor    BPH (benign prostatic hyperplasia)    CAD (coronary artery disease)    a. s/p INF MI 1997;  b. s/p CABG 2001;  c. [place] 11/2009:  3v CAD, S-PDA ok with 40% mid, S-OM ok, S-Dx ok, L-LAD ok, EF 50%;  d.  [facility] 5/14:  Inferolateral scar, EF 46%, no ischemia   COPD (chronic obstructive pulmonary disease) (HCC)    Dyspnea    ETOH abuse    GERD (gastroesophageal reflux disease)    History of gout    Hypercholesterolemia    Hypertension    Myocardial infarction [facility]) 2013   Prostate cancer (HCC)    Tobacco abuse      No Known Allergies   Current Outpatient Medications  Medication Sig Dispense Refill   acetaminophen (TYLENOL) 500 MG tablet Take 2 tablets (1,000 mg total) by mouth every 6 (six) hours as needed for moderate pain or headache. 90 tablet 0  albuterol (VENTOLIN HFA) 108 (90 Base) MCG/ACT inhaler Inhale 2 puffs into the lungs every 6 (six) hours as needed for wheezing or shortness of breath. 3 each 3   allopurinol (ZYLOPRIM) 100 MG tablet Take 1 tablet (100 mg total) by mouth daily. 90 tablet 3   apixaban (ELIQUIS) 5 MG TABS tablet Take 1 tablet (5 mg total) by mouth 2 (two) times daily. 60 tablet 6   atorvastatin (LIPITOR) 80 MG tablet TAKE 1 TABLET EVERY DAY AT 6 PM 90 tablet 3   B Complex Vitamins (VITAMIN B COMPLEX PO) Take 1 tablet by mouth daily.     Budeson-Glycopyrrol-Formoterol (BREZTRI AEROSPHERE) 160-9-4.8 MCG/ACT AERO Take 2 puffs by mouth in the morning and at bedtime. Dispense 3 inhalers 32.1 g 5   Cholecalciferol (VITAMIN D3) 125 MCG (5000 UT) CAPS Take 5,000 Units by mouth daily.     clopidogrel (PLAVIX) 75 MG tablet Take 1 tablet (75 mg total) by mouth daily. Continue this for 1 year 90 tablet 3   empagliflozin (JARDIANCE) 10 MG TABS tablet Take 1 tablet (10 mg total) by mouth  daily. 30 tablet 0   fluticasone (FLONASE) 50 MCG/ACT nasal spray Place 2 sprays into both nostrils daily as needed for allergies. 48 g 2   furosemide (LASIX) 20 MG tablet Take 1 tablet (20 mg total) by mouth as needed (swelling). 30 tablet 2   losartan (COZAAR) 25 MG tablet Take 1 tablet (25 mg total) by mouth daily. 90 tablet 3   metoprolol succinate (TOPROL XL) 50 MG 24 hr tablet Take 1.5 tablets (75 mg total) by mouth daily. 135 tablet 3   nitroGLYCERIN (NITROSTAT) 0.4 MG SL tablet Place 1 tablet (0.4 mg total) under the tongue every 5 (five) minutes as needed for chest pain. 75 tablet 2   pantoprazole (PROTONIX) 40 MG tablet Take 1 tablet (40 mg total) by mouth daily. 90 tablet 2   spironolactone (ALDACTONE) 25 MG tablet Take 0.5 tablets (12.5 mg total) by mouth daily. 45 tablet 3   No current facility-administered medications for this visit.     Past Surgical History:  Procedure Laterality Date   ANTERIOR CERVICAL DECOMPRESSION/DISCECTOMY FUSION 4 LEVELS N/A 07/21/2016   Procedure: ANTERIOR CERVICAL DECOMPRESSION/DISCECTOMY FUSION CERVICAL TWO-THREE, CERVICAL THREE-FOUR,. CERVICAL FOUR-FIVE, CERVICAL  FIVE-SIX;  Surgeon: Ashok Pall, MD;  Location: [facility];  Service: Neurosurgery;  Laterality: N/A;   BACK SURGERY     CARDIAC CATHETERIZATION  2011   CARDIOVERSION N/A 06/10/2020   Procedure: CARDIOVERSION;  Surgeon: Arnoldo Lenis, MD;  [facility];  Service: Endoscopy;  Laterality: N/A;   COLONOSCOPY     in remote past, Dr. Sharlett Iles. Obtaining records.    COLONOSCOPY N/A 11/21/2017   Surgeon: Daneil Dolin, MD; Three 4-6 mm polyps in the rectum and splenic flexure resected and retrieved (tubular adenomas), nonbleeding internal hemorrhoids.  Repeat in 5 years.   COLONOSCOPY WITH PROPOFOL N/A 06/09/2021   Surgeon: Daneil Dolin, MD;  Three 3-7 mm polyps in the ascending colon resected and retrieved, mild radiation proctitis, otherwise normal exam. Pathology revealed  tubular adenomas.  Recommended repeat colonoscopy in 5 years.   CORONARY ANGIOPLASTY     CORONARY ARTERY BYPASS GRAFT     CORONARY STENT INTERVENTION N/A 03/27/2022   Procedure: CORONARY STENT INTERVENTION;  Surgeon: Martinique, Peter M, MD;  Location: [facility];  Service: Cardiovascular;  Laterality: N/A;   ESOPHAGOGASTRODUODENOSCOPY  05/27/2012   [id number] esophagus, stomach and duodenum s/p dilator  POLYPECTOMY  11/21/2017   Procedure: POLYPECTOMY;  Surgeon: Daneil Dolin, MD;  [facility];  Service: Endoscopy;;  colon   POLYPECTOMY  06/09/2021   Procedure: POLYPECTOMY;  Surgeon: Daneil Dolin, MD;  [facility];  Service: Endoscopy;;   PROSTATE SURGERY     RIGHT/LEFT HEART CATH AND CORONARY/GRAFT ANGIOGRAPHY N/A 03/27/2022   Procedure: RIGHT/LEFT HEART CATH AND CORONARY/GRAFT ANGIOGRAPHY;  Surgeon: Martinique, Peter M, MD;  Location: [facility];  Service: Cardiovascular;  Laterality: N/A;   TRANSURETHRAL RESECTION OF BLADDER TUMOR WITH MITOMYCIN-C Left 01/06/2020   Procedure: CYSTOSCOPY TRANSURETHRAL RESECTION OF BLADDER TUMOR,LEFT URETERSCOPY AND GEMCITABINE;  Surgeon: Irine Seal, MD;  Location: [facility];  Service: Urology;  Laterality: Left;     No Known Allergies    Family History  Problem Relation Age of Onset   Diabetes Mother    Hypertension Father    Heart disease Brother    Diabetes Brother    Heart disease Brother    Diabetes Brother    Multiple sclerosis Daughter    Healthy Son    Colon cancer Neg Hx    Inflammatory bowel disease Neg Hx      Social History Mr. Cherry reports that he has been smoking cigarettes and e-cigarettes. He started smoking about 52 years ago. He has a 29.00 pack-year smoking history. He has never been exposed to tobacco smoke. He quit smokeless tobacco use about 10 years ago.  His smokeless tobacco use included snuff. Mr. Sampley reports current alcohol use of about 2.0 - 4.0 standard drinks of alcohol per  week.   Review of Systems CONSTITUTIONAL: No weight loss, fever, chills, weakness or fatigue.  HEENT: Eyes: No visual loss, blurred vision, double vision or yellow sclerae.No hearing loss, sneezing, congestion, runny nose or sore throat.  SKIN: No rash or itching.  CARDIOVASCULAR: per hpi RESPIRATORY: per hpi GASTROINTESTINAL: No anorexia, nausea, vomiting or diarrhea. No abdominal pain or blood.  GENITOURINARY: No burning on urination, no polyuria NEUROLOGICAL: No headache, dizziness, syncope, paralysis, ataxia, numbness or tingling in the extremities. No change in bowel or bladder control.  MUSCULOSKELETAL: No muscle, back pain, joint pain or stiffness.  LYMPHATICS: No enlarged nodes. No history of splenectomy.  PSYCHIATRIC: No history of depression or anxiety.  ENDOCRINOLOGIC: No reports of sweating, cold or heat intolerance. No polyuria or polydipsia.  Marland Kitchen   Physical Examination Today's Vitals   10/13/22 1322  BP: (!) 110/58  Pulse: 64  SpO2: 97%  Weight: 149 lb 6.4 oz (67.8 kg)  Height: '5\' 6"'$  (1.676 m)   Body mass index is 24.11 kg/m.  Gen: resting comfortably, no acute distress HEENT: no scleral icterus, pupils equal round and reactive, no palptable cervical adenopathy,  CV: RRR, no m/rg, no jvd Resp: Clear to auscultation bilaterally GI: abdomen is soft, non-tender, non-distended, normal bowel sounds, no hepatosplenomegaly MSK: extremities are warm, no edema.  Skin: warm, no rash Neuro:  no focal deficits Psych: appropriate affect   Diagnostic Studies 11/25/2009:   Cardiac Cath Findings: Left ventricular angiogram was performed in the RAO projection and   showed normal left ventricular systolic function with ejection   fraction estimated at 50%. Mild mitral regurgitation was noted.   10.Aortic root angiogram was performed and did not show enlargement of   the aortic root.   IMPRESSION:   1. Severe triple-vessel coronary artery disease.   2. Patent bypass grafts  4/4.   3. Low normal left ventricular systolic function.  12/2012 Myoview Inferolateral scar, no active ischemia, LVEF 46%.     05/01/13 Echo: LVEF 55-60%, grade II diastolic dysfunction, multiple WMAs, mild MR,     06/2016 AAA [place] No aneurysm     02/2022 echo IMPRESSIONS     1. Left ventricular ejection fraction, by estimation, is 40 to 45%. The  left ventricle has mildly decreased function. The left ventricle  demonstrates regional wall motion abnormalities (see scoring  diagram/findings for description). There is mild  asymmetric left ventricular hypertrophy of the basal segment. Left  ventricular diastolic parameters are indeterminate. The average left  ventricular global longitudinal strain is -10.0 %. The global longitudinal  strain is abnormal.   2. Right ventricular systolic function is mildly reduced. The right  ventricular size is normal. There is normal pulmonary artery systolic  pressure. The estimated right ventricular systolic pressure is [id number] mmHg.   3. Left atrial size was mildly dilated.   4. The mitral valve is grossly normal. Mild to moderate mitral valve  regurgitation.   5. Tricuspid valve regurgitation is mild to moderate.   6. The aortic valve is tricuspid. Aortic valve regurgitation is not  visualized.   7. The inferior vena cava is normal in size with greater than 50%  respiratory variability, suggesting right atrial pressure of 3 mmHg.        03/2022 RHC/LHC  Ost LAD to Prox LAD lesion is 100% stenosed.   Mid LAD lesion is 100% stenosed.   Prox Cx lesion is 100% stenosed.   Prox RCA to Mid RCA lesion is 80% stenosed.   Mid RCA lesion is 100% stenosed.   Origin to Prox Graft lesion is 80% stenosed.   Origin to Prox Graft lesion is 100% stenosed.   A drug-eluting stent was successfully placed using a SYNERGY XD 4.0X12.   Post intervention, there is a 0% residual stenosis.   LIMA graft was visualized by angiography and is normal in caliber.   SVG  graft was visualized by angiography and is normal in caliber.   SVG graft was visualized by angiography and is normal in caliber.   SVG graft was visualized by angiography.   The graft exhibits no disease.   The graft exhibits no disease.   LV end diastolic pressure is mildly elevated.   Hemodynamic findings consistent with mild pulmonary hypertension.   3 vessel occlusive CAD Patent LIMA to the LAD Patent SVG to the first diagonal Patent SVG to OM1 with 80% focal stenosis in the proximal SVG Occluded SVG to RCA. The RCA fills by left to right collaterals.  Mildly elevated LV filling pressures.  Mild pulmonary HTN.  Normal cardiac output 4.51 L/min with index 2.53.  Successful PCI of the SVG to OM1 with DES   Plan: DAPT with ASA for one month and Plavix for 12 months. Can resume oral anticoagulation in am if no bleeding noted.    06/2022 echo  1. Left ventricular ejection fraction, by estimation, is 45 to 50%. The  left ventricle has mildly decreased function. The left ventricle  demonstrates regional wall motion abnormalities (see scoring  diagram/findings for description). There is mild  asymmetric left ventricular hypertrophy of the basal segment. Left  ventricular diastolic parameters are indeterminate.   2. Right ventricular systolic function is normal. The right ventricular  size is normal. There is normal pulmonary artery systolic pressure. The  estimated right ventricular systolic pressure is [id number] mmHg.   3. Left atrial size was mild to moderately dilated.  4. The mitral valve is grossly normal. Mild to moderate mitral valve  regurgitation.   5. The aortic valve is tricuspid. Aortic valve regurgitation is not  visualized.   6. The inferior vena cava is normal in size with greater than 50%  respiratory variability, suggesting right atrial pressure of 3 mmHg.   Comparison(s): Prior images reviewed side by side. LVEF improved somewhat  at 45-50% with wall motion  abnormalities consistent with ischemic  cardiomyopathy.   Assessment and Plan   1.HFrEF, chronic - on max tolerated medical therapy. Soft bp's today, prior high K on higher ARB dosing, last K was high normal at 5 on losartan '25mg'$  daily. Would not try entresto given bp and potassium - euvolemic today, continue current meds   2. CAD - recent stent as reported  above - continue plavix, eliquis. Likely can d/c plavix in Aug   3. AFib - no symptoms,continue current meds  4. COPD - some recent cough, wheezing, SOB - give 5 day course of prednisone '40mg'$ , f/u with pulmonary     Arnoldo Lenis, M.D.

## 2022-10-13 NOTE — Patient Instructions (Signed)
Medication Instructions:  Your physician has recommended you make the following change in your medication:  - Start Prednisone 40 mg tablets once daily for 5 days.    Labwork: None  Testing/Procedures: None  Follow-Up: Follow up with [name] or APP in 3 months.   Any Other Special Instructions Will Be Listed Below (If Applicable).     If you need a refill on your cardiac medications before your next appointment, please call your pharmacy.

## 2022-10-13 NOTE — Addendum Note (Signed)
Addended by: Brynda Peon on: 10/13/2022 01:57 PM   Modules accepted: Orders

## 2022-10-16 ENCOUNTER — Encounter: Payer: Self-pay | Admitting: *Deleted

## 2022-10-23 ENCOUNTER — Telehealth: Payer: Self-pay | Admitting: Cardiology

## 2022-10-23 MED ORDER — EMPAGLIFLOZIN 10 MG PO TABS: 10.0000 mg | ORAL_TABLET | Freq: Every day | ORAL | 0 refills | Status: DC

## 2022-10-23 NOTE — Telephone Encounter (Signed)
Pt c/o medication issue:  1. Name of Medication: empagliflozin (JARDIANCE) 10 MG TABS tablet   2. How are you currently taking this medication (dosage and times per day)? 1 tablet daily  3. Are you having a reaction (difficulty breathing--STAT)? no  4. What is your medication issue? Patient's wife states the patient was getting jardiance for free. She says he has 2 days left of the medication and it would take 10 days for them to get the application to re-enroll in patient assistance. She would like to know if the office has an application or if the office can fill it out online. Please advise.

## 2022-10-23 NOTE — Telephone Encounter (Signed)
Spoke with wife who states that pt is down to his last 2 pill of Jardiance 10 mg. She reports that pt needs to re-enroll in [employer] [facility] patient assistance. Will print pt assist forms for Jardiance and Eliquis and place at front desk for pick up.

## 2022-10-24 ENCOUNTER — Ambulatory Visit: Payer: Self-pay | Admitting: *Deleted

## 2022-11-04 NOTE — Progress Notes (Unsigned)
Referring Provider: [name], MD Primary Care Physician:  [name], MD Primary GI Physician: Dr. Gala Romney  No chief complaint on file.   HPI:   [name] is a 77 y.o. male with with history significant for atrial fibrillation/atrial flutter s/p cardioversion 06/2020 on warfarin, COPD, CAD, MI s/p CABG, HTN, GERD, bladder cancer, and prostate cancer , GERD, chronic intermittent toilet tissue hematochezia, radiation proctitis, adenomatous colon polyps, presenting today ***  Last seen in our office 10/24/2021.  Reported rectal bleeding resolved.  GERD well-controlled on Protonix 40 mg daily.  Continue with liquid but no food dysphagia, not ready for evaluation.  Patient reported B12 and folate deficiency diagnosed in April 2022.  Planned to update labs, continue Protonix 40 mg daily, monitor dysphagia and for recurrent rectal bleeding, follow-up with PCP on B12 and folate deficiency, follow-up with [place] in 6 months.  Labs completed and showed a hemoglobin of 13.1, iron 137, saturation 58% (H).   Most recent labs on file 05/30/2022 with hemoglobin 13.1 with macrocytic indices.   Last Colonoscopy 06/09/2021:  Three 3-7 mm polyps in the ascending colon resected and retrieved, mild radiation proctitis, otherwise normal exam.  Notably, patient reported bleeding had resolved.  Pathology revealed tubular adenomas.  Recommended repeat colonoscopy in 5 years.   Last EGD 05/27/2012: Normal exam s/p empiric esophageal dilation.  Today:   Past Medical History:  Diagnosis Date   Anginal pain ([place])    Atrial fibrillation by electrocardiogram ([place]) 11/2019   Atrial flutter by electrocardiogram [facility]) 11/2019   Bladder cancer ([place]) 01/2020   Bladder tumor    BPH (benign prostatic hyperplasia)    CAD (coronary artery disease)    a. s/p INF MI 1997;  b. s/p CABG 2001;  c. [place] 11/2009:  3v CAD, S-PDA ok with 40% mid, S-OM ok, S-Dx ok, L-LAD ok, EF 50%;  d.  [place] 5/14:  Inferolateral scar,  EF 46%, no ischemia   COPD (chronic obstructive pulmonary disease) (HCC)    Dyspnea    ETOH abuse    GERD (gastroesophageal reflux disease)    History of gout    Hypercholesterolemia    Hypertension    Myocardial infarction [facility]) 2013   Prostate cancer ([place])    Tobacco abuse     Past Surgical History:  Procedure Laterality Date   ANTERIOR CERVICAL DECOMPRESSION/DISCECTOMY FUSION 4 LEVELS N/A 07/21/2016   Procedure: ANTERIOR CERVICAL DECOMPRESSION/DISCECTOMY FUSION CERVICAL TWO-THREE, CERVICAL THREE-FOUR,. CERVICAL FOUR-FIVE, CERVICAL  FIVE-SIX;  Surgeon: Ashok Pall, MD;  Location: [place];  Service: Neurosurgery;  Laterality: N/A;   BACK SURGERY     CARDIAC CATHETERIZATION  2011   CARDIOVERSION N/A 06/10/2020   Procedure: CARDIOVERSION;  Surgeon: Arnoldo Lenis, MD;  [facility];  Service: Endoscopy;  Laterality: N/A;   COLONOSCOPY     in remote past, Dr. Sharlett Iles. Obtaining records.    COLONOSCOPY N/A 11/21/2017   Surgeon: Daneil Dolin, MD; Three 4-6 mm polyps in the rectum and splenic flexure resected and retrieved (tubular adenomas), nonbleeding internal hemorrhoids.  Repeat in 5 years.   COLONOSCOPY WITH PROPOFOL N/A 06/09/2021   Surgeon: Daneil Dolin, MD;  Three 3-7 mm polyps in the ascending colon resected and retrieved, mild radiation proctitis, otherwise normal exam. Pathology revealed tubular adenomas.  Recommended repeat colonoscopy in 5 years.   CORONARY ANGIOPLASTY     CORONARY ARTERY BYPASS GRAFT     CORONARY STENT INTERVENTION N/A 03/27/2022   Procedure: CORONARY STENT  INTERVENTION;  Surgeon: Martinique, Peter M, MD;  Location: [place] [facility];  Service: Cardiovascular;  Laterality: N/A;   ESOPHAGOGASTRODUODENOSCOPY  05/27/2012   [id number] esophagus, stomach and duodenum s/p dilator   POLYPECTOMY  11/21/2017   Procedure: POLYPECTOMY;  Surgeon: Daneil Dolin, MD;  [facility];  Service: Endoscopy;;  colon   POLYPECTOMY  06/09/2021    Procedure: POLYPECTOMY;  Surgeon: Daneil Dolin, MD;  [facility];  Service: Endoscopy;;   PROSTATE SURGERY     RIGHT/LEFT HEART CATH AND CORONARY/GRAFT ANGIOGRAPHY N/A 03/27/2022   Procedure: RIGHT/LEFT HEART CATH AND CORONARY/GRAFT ANGIOGRAPHY;  Surgeon: Martinique, Peter M, MD;  Location: [place] [facility];  Service: Cardiovascular;  Laterality: N/A;   TRANSURETHRAL RESECTION OF BLADDER TUMOR WITH MITOMYCIN-C Left 01/06/2020   Procedure: CYSTOSCOPY TRANSURETHRAL RESECTION OF BLADDER TUMOR,LEFT URETERSCOPY AND GEMCITABINE;  Surgeon: Irine Seal, MD;  Location: [facility];  Service: Urology;  Laterality: Left;    Current Outpatient Medications  Medication Sig Dispense Refill   acetaminophen (TYLENOL) 500 MG tablet Take 2 tablets (1,000 mg total) by mouth every 6 (six) hours as needed for moderate pain or headache. 90 tablet 0   albuterol (VENTOLIN HFA) 108 (90 Base) MCG/ACT inhaler Inhale 2 puffs into the lungs every 6 (six) hours as needed for wheezing or shortness of breath. 3 each 3   allopurinol (ZYLOPRIM) 100 MG tablet Take 1 tablet (100 mg total) by mouth daily. 90 tablet 3   amLODipine (NORVASC) 5 MG tablet Take 1 tablet (5 mg total) by mouth daily. 90 tablet 3   apixaban (ELIQUIS) 5 MG TABS tablet Take 1 tablet (5 mg total) by mouth 2 (two) times daily. 60 tablet 5   atorvastatin (LIPITOR) 80 MG tablet TAKE 1 TABLET EVERY DAY AT 6 PM 90 tablet 3   B Complex Vitamins (VITAMIN B COMPLEX PO) Take 1 tablet by mouth daily.     Budeson-Glycopyrrol-Formoterol (BREZTRI AEROSPHERE) 160-9-4.8 MCG/ACT AERO Take 2 puffs by mouth in the morning and at bedtime. Dispense 3 inhalers 32.1 g 5   Cholecalciferol (VITAMIN D3) 125 MCG (5000 UT) CAPS Take 5,000 Units by mouth daily.     clopidogrel (PLAVIX) 75 MG tablet Take 1 tablet (75 mg total) by mouth daily. Continue this for 1 year 90 tablet 3   empagliflozin (JARDIANCE) 10 MG TABS tablet Take 1 tablet (10 mg total) by mouth daily. 14 tablet 0    fluticasone (FLONASE) 50 MCG/ACT nasal spray Place 2 sprays into both nostrils daily as needed for allergies. 48 g 2   furosemide (LASIX) 20 MG tablet Take 1 tablet (20 mg total) by mouth as needed (swelling). 30 tablet 2   losartan (COZAAR) 25 MG tablet Take 1 tablet (25 mg total) by mouth daily. 90 tablet 3   metoprolol succinate (TOPROL XL) 50 MG 24 hr tablet Take 1.5 tablets (75 mg total) by mouth daily. 135 tablet 3   nitroGLYCERIN (NITROSTAT) 0.4 MG SL tablet Place 1 tablet (0.4 mg total) under the tongue every 5 (five) minutes as needed for chest pain. 75 tablet 2   pantoprazole (PROTONIX) 40 MG tablet Take 1 tablet (40 mg total) by mouth daily. 90 tablet 2   predniSONE (DELTASONE) 20 MG tablet Take 2 tablets (40 mg total) by mouth daily with breakfast. 10 tablet 0   spironolactone (ALDACTONE) 25 MG tablet Take 0.5 tablets (12.5 mg total) by mouth daily. 45 tablet 3   No current facility-administered medications for this visit.  Allergies as of 11/06/2022   (No Known Allergies)    Family History  Problem Relation Age of Onset   Diabetes Mother    Hypertension Father    Heart disease Brother    Diabetes Brother    Heart disease Brother    Diabetes Brother    Multiple sclerosis Daughter    Healthy Son    Colon cancer Neg Hx    Inflammatory bowel disease Neg Hx     Social History   Socioeconomic History   Marital status: Married    Spouse name: Not on file   Number of children: 2   Years of education: Not on file   Highest education level: Not on file  Occupational History    Comment: retired  Tobacco Use   Smoking status: Every Day    Packs/day: 0.50    Years: 58.00    Additional pack years: 0.00    Total pack years: 29.00    Types: Cigarettes, E-cigarettes    Start date: 03/08/1970    Passive exposure: Never   Smokeless tobacco: Former    Types: Snuff    Quit date: 03/08/2012   Tobacco comments:    1/2 ppd 05/18/22 [facility]   Vaping Use   Vaping Use: Former   Substance and Sexual Activity   Alcohol use: Yes    Alcohol/week: 2.0 - 4.0 standard drinks of alcohol    Types: 2 - 4 Cans of beer per week    Comment: 2-4 beers per day   Drug use: No   Sexual activity: Yes    Partners: Female  Other Topics Concern   Not on file  Social History Narrative   His brother lives with him and his wife. Daughter lives in [place], son lives nearby.   Social Determinants of Health   Financial Resource Strain: Low Risk  (11/28/2021)   Overall Financial Resource Strain (CARDIA)    Difficulty of Paying Living Expenses: Not hard at all  Food Insecurity: No Food Insecurity (11/28/2021)   Hunger Vital Sign    Worried About Running Out of Food in the Last Year: Never true    Ran Out of Food in the Last Year: Never true  Transportation Needs: No Transportation Needs (11/28/2021)   PRAPARE - Hydrologist (Medical): No    Lack of Transportation (Non-Medical): No  Physical Activity: Insufficiently Active (11/28/2021)   Exercise Vital Sign    Days of Exercise per Week: 7 days    Minutes of Exercise per Session: 20 min  Stress: No Stress Concern Present (11/28/2021)   [place]    Feeling of Stress : Not at all  Social Connections: Moderately Isolated (11/28/2021)   Social Connection and Isolation Panel [NHANES]    Frequency of Communication with Friends and Family: More than three times a week    Frequency of Social Gatherings with Friends and Family: More than three times a week    Attends Religious Services: Never    Marine scientist or Organizations: No    Attends Archivist Meetings: Never    Marital Status: Married    Review of Systems: Gen: Denies fever, chills, anorexia. Denies fatigue, weakness, weight loss.  CV: Denies chest pain, palpitations, syncope, peripheral edema, and claudication. Resp: Denies dyspnea at rest, cough, wheezing, coughing up  blood, and pleurisy. GI: Denies vomiting blood, jaundice, and fecal incontinence.   Denies dysphagia or odynophagia. Derm: Denies  rash, itching, dry skin Psych: Denies depression, anxiety, memory loss, confusion. No homicidal or suicidal ideation.  Heme: Denies bruising, bleeding, and enlarged lymph nodes.  Physical Exam: There were no vitals taken for this visit. General:   Alert and oriented. No distress noted. Pleasant and cooperative.  Head:  Normocephalic and atraumatic. Eyes:  Conjuctiva clear without scleral icterus. Heart:  S1, S2 present without murmurs appreciated. Lungs:  Clear to auscultation bilaterally. No wheezes, rales, or rhonchi. No distress.  Abdomen:  +BS, soft, non-tender and non-distended. No rebound or guarding. No HSM or masses noted. Msk:  Symmetrical without gross deformities. Normal posture. Extremities:  Without edema. Neurologic:  Alert and  oriented x4 Psych:  Normal mood and affect.    Assessment:     Plan:  ***   Aliene Altes, PA-C [facility] Gastroenterology 11/06/2022

## 2022-11-06 ENCOUNTER — Ambulatory Visit: Payer: Medicare HMO | Admitting: Gastroenterology

## 2022-11-06 ENCOUNTER — Encounter: Payer: Self-pay | Admitting: Gastroenterology

## 2022-11-06 VITALS — BP 118/78 | HR 68 | Temp 97.5°F | Ht 66.0 in | Wt 148.0 lb

## 2022-11-06 DIAGNOSIS — K219 Gastro-esophageal reflux disease without esophagitis: Secondary | ICD-10-CM

## 2022-11-06 DIAGNOSIS — K625 Hemorrhage of anus and rectum: Secondary | ICD-10-CM

## 2022-11-06 NOTE — Patient Instructions (Addendum)
Monitor for recurrent rectal bleeding and let me know if this occurs.  For reflux/indigestion: Continue taking pantoprazole 40 mg daily 30 minutes before breakfast. You can use Tums or over-the-counter Pepcid 20 mg as needed for breakthrough. Follow a GERD diet:  Avoid fried, fatty, greasy, spicy, citrus foods. Avoid caffeine and carbonated beverages. Avoid chocolate. Try eating 4-6 small meals a day rather than 3 large meals. Do not eat within 3 hours of laying down. Prop head of bed up on wood or bricks to create a 6 inch incline.  We will follow-up with you in 6 months or sooner if needed.  It was good to see you again today!   Aliene Altes, PA-C [facility] Gastroenterology

## 2022-11-08 ENCOUNTER — Telehealth: Payer: Self-pay | Admitting: Cardiology

## 2022-11-08 MED ORDER — EMPAGLIFLOZIN 10 MG PO TABS: 10.0000 mg | ORAL_TABLET | Freq: Every day | ORAL | 0 refills | Status: DC

## 2022-11-08 NOTE — Telephone Encounter (Signed)
Wife is following-up on patient's Jardiance application.  Wife states company has not received the documentation as yet.  Wife states patient is now out of this medication and will need samples.

## 2022-11-08 NOTE — Telephone Encounter (Signed)
Spoke with [name] at [facility]) who states that they do not have pt's application. Application re-faxed to [phone]. [name] states that it takes 24-48 hours to receive a fax and 5-7 days to process. Pt given 2 weeks of samples.

## 2022-11-08 NOTE — Telephone Encounter (Signed)
Pt wife notified and voiced understanding  

## 2022-11-09 ENCOUNTER — Telehealth: Payer: Self-pay | Admitting: *Deleted

## 2022-11-09 ENCOUNTER — Telehealth: Payer: Self-pay | Admitting: Cardiology

## 2022-11-09 MED ORDER — EMPAGLIFLOZIN 10 MG PO TABS: 10.0000 mg | ORAL_TABLET | Freq: Every day | ORAL | 11 refills | Status: DC

## 2022-11-09 NOTE — Telephone Encounter (Signed)
Returned call to wife. No answer. Left msg to call back.  

## 2022-11-09 NOTE — Telephone Encounter (Signed)
Wife in office to pick up samples of Jardiance. She states that the patient would like to switch from Jardiance to Coumadin.

## 2022-11-09 NOTE — Telephone Encounter (Signed)
Pt c/o medication issue:  1. Name of Medication: warfarin (COUMADIN) 5 MG tablet   2. How are you currently taking this medication (dosage and times per day)?   3. Are you having a reaction (difficulty breathing--STAT)? No  4. What is your medication issue? Pt's wife called stating she would like to have the pt start taking his Warfarin medication again. Pt's wife wants to know if he needs to go back and get his finger pricked every month. Please advise

## 2022-11-09 NOTE — Telephone Encounter (Signed)
Received a fax from [facility] stating that pt has been denied for pt assistance at this time d/t having private drug coverage. Pt's wife notified and voiced understanding.

## 2022-11-09 NOTE — Telephone Encounter (Addendum)
Have called pt's wife back twice.  Went to voice mail both time.  Left message I would call her back.  11/16/22  Spoke with wife.  Patient is trying to decide if he wants to change from Eliquis to coumadin due to cost.  He has a week of Eliquis left. Told her he would need to overlap Eliquis and Warfarin x 3 days if he does decide to switch. They will talk about it over the weekend and call me back next week.

## 2022-11-16 ENCOUNTER — Telehealth: Payer: Self-pay | Admitting: Cardiology

## 2022-11-16 MED ORDER — EMPAGLIFLOZIN 10 MG PO TABS: 10.0000 mg | ORAL_TABLET | Freq: Every day | ORAL | 11 refills | Status: DC

## 2022-11-16 NOTE — Telephone Encounter (Signed)
Refilled to local pharmacy 

## 2022-11-16 NOTE — Telephone Encounter (Signed)
*  STAT* If patient is at the pharmacy, call can be transferred to refill team.   1. Which medications need to be refilled? (please list name of each medication and dose if known) empagliflozin (JARDIANCE) 10 MG TABS tablet   2. Which pharmacy/location (including street and city if local pharmacy) is medication to be sent to?  [facility] [place], [place] - [address]  3. Do they need a 30 day or 90 day supply? 30 day   Pt out of medication

## 2022-11-16 NOTE — Telephone Encounter (Signed)
Wife called back and pt wants to go ahead and change back to warfarin.  Appt made for 11/20/22 and she was in agreement.

## 2022-11-20 ENCOUNTER — Ambulatory Visit: Payer: Medicare HMO | Attending: Internal Medicine | Admitting: *Deleted

## 2022-11-20 DIAGNOSIS — Z5181 Encounter for therapeutic drug level monitoring: Secondary | ICD-10-CM | POA: Diagnosis not present

## 2022-11-20 DIAGNOSIS — I4891 Unspecified atrial fibrillation: Secondary | ICD-10-CM

## 2022-11-20 LAB — POCT INR: INR: 1.3 — AB (ref 2.0–3.0)

## 2022-11-20 NOTE — Patient Instructions (Signed)
Here to change from Eliquis to warfarin due to cost.   Will start tonight and overlap Eliquis 5mg  twice daily and Warfarin 2.5mg  daily except 5mg  on Mondays and Thursdays x 3 days (Monday, Tuesday and Wednesday) then stop Eliquis and continue only warfarin. Recheck INR in 2 weeks.

## 2022-11-30 ENCOUNTER — Ambulatory Visit (INDEPENDENT_AMBULATORY_CARE_PROVIDER_SITE_OTHER): Payer: Medicare HMO | Admitting: Family Medicine

## 2022-11-30 ENCOUNTER — Ambulatory Visit (INDEPENDENT_AMBULATORY_CARE_PROVIDER_SITE_OTHER): Payer: Medicare HMO

## 2022-11-30 ENCOUNTER — Encounter: Payer: Self-pay | Admitting: Family Medicine

## 2022-11-30 VITALS — BP 101/60 | HR 78 | Temp 97.8°F | Ht 66.0 in | Wt 147.4 lb

## 2022-11-30 VITALS — Wt 148.0 lb

## 2022-11-30 DIAGNOSIS — J449 Chronic obstructive pulmonary disease, unspecified: Secondary | ICD-10-CM

## 2022-11-30 DIAGNOSIS — Z0000 Encounter for general adult medical examination without abnormal findings: Secondary | ICD-10-CM

## 2022-11-30 DIAGNOSIS — I4811 Longstanding persistent atrial fibrillation: Secondary | ICD-10-CM

## 2022-11-30 DIAGNOSIS — E782 Mixed hyperlipidemia: Secondary | ICD-10-CM | POA: Diagnosis not present

## 2022-11-30 DIAGNOSIS — G4762 Sleep related leg cramps: Secondary | ICD-10-CM | POA: Diagnosis not present

## 2022-11-30 DIAGNOSIS — I110 Hypertensive heart disease with heart failure: Secondary | ICD-10-CM

## 2022-11-30 DIAGNOSIS — M545 Low back pain, unspecified: Secondary | ICD-10-CM | POA: Diagnosis not present

## 2022-11-30 DIAGNOSIS — D649 Anemia, unspecified: Secondary | ICD-10-CM | POA: Diagnosis not present

## 2022-11-30 DIAGNOSIS — M109 Gout, unspecified: Secondary | ICD-10-CM

## 2022-11-30 DIAGNOSIS — I5022 Chronic systolic (congestive) heart failure: Secondary | ICD-10-CM

## 2022-11-30 DIAGNOSIS — I10 Essential (primary) hypertension: Secondary | ICD-10-CM

## 2022-11-30 LAB — URINALYSIS
Bilirubin, UA: NEGATIVE
Ketones, UA: NEGATIVE
Leukocytes,UA: NEGATIVE
Nitrite, UA: NEGATIVE
Protein,UA: NEGATIVE
Specific Gravity, UA: 1.01 (ref 1.005–1.030)
Urobilinogen, Ur: 0.2 mg/dL (ref 0.2–1.0)
pH, UA: 6.5 (ref 5.0–7.5)

## 2022-11-30 NOTE — Progress Notes (Signed)
Subjective:   [name] is a 77 y.o. male who presents for Medicare Annual/Subsequent preventive examination.  I connected with  [name] on 12/01/22 by a audio enabled telemedicine application and verified that I am speaking with the correct person using two identifiers.  Patient Location: Home  Provider Location: Home Office  I discussed the limitations of evaluation and management by telemedicine. The patient expressed understanding and agreed to proceed.   Review of Systems     Cardiac Risk Factors include: advanced age (>38men, >67 women);hypertension     Objective:    Today's Vitals   11/30/22 1032  Weight: 148 lb (67.1 kg)   Body mass index is 23.89 kg/m.     11/30/2022   10:40 AM 03/26/2022    4:35 PM 03/26/2022    3:54 PM 03/25/2022    7:07 PM 11/28/2021   12:05 PM 06/09/2021    1:47 PM 06/07/2021    1:24 PM  Advanced Directives  Does Patient Have a Medical Advance Directive? No  No No No No No  Would patient like information on creating a medical advance directive? Yes (Inpatient - patient defers creating a medical advance directive and declines information at this time) No - Patient declined   No - Patient declined No - Patient declined No - Patient declined    Current Medications (verified) Outpatient Encounter Medications as of 11/30/2022  Medication Sig   acetaminophen (TYLENOL) 500 MG tablet Take 2 tablets (1,000 mg total) by mouth every 6 (six) hours as needed for moderate pain or headache.   albuterol (VENTOLIN HFA) 108 (90 Base) MCG/ACT inhaler Inhale 2 puffs into the lungs every 6 (six) hours as needed for wheezing or shortness of breath.   allopurinol (ZYLOPRIM) 100 MG tablet Take 1 tablet (100 mg total) by mouth daily.   amLODipine (NORVASC) 5 MG tablet Take 1 tablet (5 mg total) by mouth daily.   atorvastatin (LIPITOR) 80 MG tablet TAKE 1 TABLET EVERY DAY AT 6 PM   B Complex Vitamins (VITAMIN B COMPLEX PO) Take 1 tablet by mouth daily.    Budeson-Glycopyrrol-Formoterol (BREZTRI AEROSPHERE) 160-9-4.8 MCG/ACT AERO Take 2 puffs by mouth in the morning and at bedtime. Dispense 3 inhalers   Cholecalciferol (VITAMIN D3) 125 MCG (5000 UT) CAPS Take 5,000 Units by mouth daily.   clopidogrel (PLAVIX) 75 MG tablet Take 1 tablet (75 mg total) by mouth daily. Continue this for 1 year   empagliflozin (JARDIANCE) 10 MG TABS tablet Take 1 tablet (10 mg total) by mouth daily before breakfast.   fluticasone (FLONASE) 50 MCG/ACT nasal spray Place 2 sprays into both nostrils daily as needed for allergies.   furosemide (LASIX) 20 MG tablet Take 1 tablet (20 mg total) by mouth as needed (swelling).   losartan (COZAAR) 25 MG tablet Take 1 tablet (25 mg total) by mouth daily.   metoprolol succinate (TOPROL XL) 50 MG 24 hr tablet Take 1.5 tablets (75 mg total) by mouth daily.   nitroGLYCERIN (NITROSTAT) 0.4 MG SL tablet Place 1 tablet (0.4 mg total) under the tongue every 5 (five) minutes as needed for chest pain.   pantoprazole (PROTONIX) 40 MG tablet Take 1 tablet (40 mg total) by mouth daily.   spironolactone (ALDACTONE) 25 MG tablet Take 0.5 tablets (12.5 mg total) by mouth daily.   warfarin (COUMADIN) 5 MG tablet Take 5 mg by mouth daily. Take 1/2 - to 1 tablet daily or as directed by coumadin clinic.   No facility-administered  encounter medications on file as of 11/30/2022.    Allergies (verified) Patient has no known allergies.   History: Past Medical History:  Diagnosis Date   Anginal pain (HCC)    Atrial fibrillation by electrocardiogram (HCC) 11/2019   Atrial flutter by electrocardiogram [facility]) 11/2019   Bladder cancer (HCC) 01/2020   Bladder tumor    BPH (benign prostatic hyperplasia)    CAD (coronary artery disease)    a. s/p INF MI 1997;  b. s/p CABG 2001;  c. LHC 11/2009:  3v CAD, S-PDA ok with 40% mid, S-OM ok, S-Dx ok, L-LAD ok, EF 50%;  d.  Lex MV 5/14:  Inferolateral scar, EF 46%, no ischemia   COPD (chronic obstructive pulmonary  disease) (HCC)    Dyspnea    ETOH abuse    GERD (gastroesophageal reflux disease)    History of gout    Hypercholesterolemia    Hypertension    Myocardial infarction [facility] 2013   Prostate cancer (HCC)    Tobacco abuse    Past Surgical History:  Procedure Laterality Date   ANTERIOR CERVICAL DECOMPRESSION/DISCECTOMY FUSION 4 LEVELS N/A 07/21/2016   Procedure: ANTERIOR CERVICAL DECOMPRESSION/DISCECTOMY FUSION CERVICAL TWO-THREE, CERVICAL THREE-FOUR,. CERVICAL FOUR-FIVE, CERVICAL  FIVE-SIX;  Surgeon: Coletta Memos, MD;  Location: [facility] OR;  Service: Neurosurgery;  Laterality: N/A;   BACK SURGERY     CARDIAC CATHETERIZATION  2011   CARDIOVERSION N/A 06/10/2020   Procedure: CARDIOVERSION;  Surgeon: Antoine Poche, MD;  Location: [facility] SUITE;  Service: Endoscopy;  Laterality: N/A;   COLONOSCOPY     in remote past, Dr. Jarold Motto. Obtaining records.    COLONOSCOPY N/A 11/21/2017   Surgeon: Corbin Ade, MD; Three 4-6 mm polyps in the rectum and splenic flexure resected and retrieved (tubular adenomas), nonbleeding internal hemorrhoids.  Repeat in 5 years.   COLONOSCOPY WITH PROPOFOL N/A 06/09/2021   Surgeon: Corbin Ade, MD;  Three 3-7 mm polyps in the ascending colon resected and retrieved, mild radiation proctitis, otherwise normal exam. Pathology revealed tubular adenomas.  Recommended repeat colonoscopy in 5 years.   CORONARY ANGIOPLASTY     CORONARY ARTERY BYPASS GRAFT     CORONARY STENT INTERVENTION N/A 03/27/2022   Procedure: CORONARY STENT INTERVENTION;  Surgeon: Swaziland, Peter M, MD;  Location: [facility] INVASIVE CV LAB;  Service: Cardiovascular;  Laterality: N/A;   ESOPHAGOGASTRODUODENOSCOPY  05/27/2012   ZOX:WRUEAV esophagus, stomach and duodenum s/p dilator   POLYPECTOMY  11/21/2017   Procedure: POLYPECTOMY;  Surgeon: Corbin Ade, MD;  Location: [facility];  Service: Endoscopy;;  colon   POLYPECTOMY  06/09/2021   Procedure: POLYPECTOMY;  Surgeon: Corbin Ade, MD;   Location: [facility];  Service: Endoscopy;;   PROSTATE SURGERY     RIGHT/LEFT HEART CATH AND CORONARY/GRAFT ANGIOGRAPHY N/A 03/27/2022   Procedure: RIGHT/LEFT HEART CATH AND CORONARY/GRAFT ANGIOGRAPHY;  Surgeon: Swaziland, Peter M, MD;  Location[facility] INVASIVE CV LAB;  Service: Cardiovascular;  Laterality: N/A;   TRANSURETHRAL RESECTION OF BLADDER TUMOR WITH MITOMYCIN-C Left 01/06/2020   Procedure: CYSTOSCOPY TRANSURETHRAL RESECTION OF BLADDER TUMOR,LEFT URETERSCOPY AND GEMCITABINE;  Surgeon: Bjorn Pippin, MD;  Location: [facility] ORS;  Service: Urology;  Laterality: Left;   Family History  Problem Relation Age of Onset   Diabetes Mother    Hypertension Father    Heart disease Brother    Diabetes Brother    Heart disease Brother    Diabetes Brother    Multiple sclerosis Daughter    Healthy Son  Colon cancer Neg Hx    Inflammatory bowel disease Neg Hx    Social History   Socioeconomic History   Marital status: Married    Spouse name: Not on file   Number of children: 2   Years of education: Not on file   Highest education level: Not on file  Occupational History    Comment: retired  Tobacco Use   Smoking status: Every Day    Packs/day: 0.50    Years: 58.00    Additional pack years: 0.00    Total pack years: 29.00    Types: Cigarettes, E-cigarettes    Start date: 03/08/1970    Passive exposure: Never   Smokeless tobacco: Former    Types: Snuff    Quit date: 03/08/2012   Tobacco comments:    1/2 ppd 05/18/22 ARJ   Vaping Use   Vaping Use: Former  Substance and Sexual Activity   Alcohol use: Yes    Alcohol/week: 2.0 - 4.0 standard drinks of alcohol    Types: 2 - 4 Cans of beer per week    Comment: 2-4 beers per day   Drug use: No   Sexual activity: Yes    Partners: Female  Other Topics Concern   Not on file  Social History Narrative   His brother lives with him and his wife. Daughter lives in [place], son lives nearby.   Social Determinants of Health   Financial Resource  Strain: Low Risk  (11/30/2022)   Overall Financial Resource Strain (CARDIA)    Difficulty of Paying Living Expenses: Not hard at all  Food Insecurity: No Food Insecurity (11/30/2022)   Hunger Vital Sign    Worried About Running Out of Food in the Last Year: Never true    Ran Out of Food in the Last Year: Never true  Transportation Needs: No Transportation Needs (11/30/2022)   PRAPARE - Administrator, Civil Service (Medical): No    Lack of Transportation (Non-Medical): No  Physical Activity: Sufficiently Active (11/30/2022)   Exercise Vital Sign    Days of Exercise per Week: 3 days    Minutes of Exercise per Session: 60 min  Stress: No Stress Concern Present (11/30/2022)   [employer] of Occupational Health - Occupational Stress Questionnaire    Feeling of Stress : Not at all  Social Connections: Socially Integrated (11/30/2022)   Social Connection and Isolation Panel [NHANES]    Frequency of Communication with Friends and Family: More than three times a week    Frequency of Social Gatherings with Friends and Family: More than three times a week    Attends Religious Services: More than 4 times per year    Active Member of [employer] or Organizations: Yes    Attends Engineer, structural: More than 4 times per year    Marital Status: Married    Tobacco Counseling Ready to quit: No Counseling given: Yes Tobacco comments: 1/2 ppd 05/18/22 ARJ    Clinical Intake:  Pre-visit preparation completed: Yes  Pain : No/denies pain     BMI - recorded: 23.89 Nutritional Status: BMI of 19-24  Normal Nutritional Risks: None Diabetes: No  How often do you need to have someone help you when you read instructions, pamphlets, or other written materials from your doctor or pharmacy?: 1 - Never  Diabetic?no  Interpreter Needed?: No  Information entered by :: Fredirick Maudlin   Activities of Daily Living    11/30/2022   10:42 AM 03/26/2022  3:54 PM  In your  present state of health, do you have any difficulty performing the following activities:  Hearing? 0 0  Vision? 0 0  Difficulty concentrating or making decisions? 0 0  Walking or climbing stairs? 0 0  Dressing or bathing? 0 0  Doing errands, shopping? 0 0  Preparing Food and eating ? N   Using the Toilet? N   In the past six months, have you accidently leaked urine? N   Do you have problems with loss of bowel control? N   Managing your Medications? N   Managing your Finances? N   Housekeeping or managing your Housekeeping? N     Patient Care Team: Mechele Claude, MD as PCP - General (Family Medicine) Wyline Mood, Dorothe Pea, MD as PCP - Cardiology (Cardiology) Jena Gauss Gerrit Friends, MD (Gastroenterology) Wyline Mood Dorothe Pea, MD as Consulting Physician (Cardiology) Michaelle Copas, MD as Referring Physician (Optometry) Cresenciano Genre, Lilla Shook, [facility] as Pharmacist (Family Medicine) Leslye Peer, MD as Consulting Physician (Pulmonary Disease) Bjorn Pippin, MD as Attending Physician (Urology) Jonelle Sidle, MD as Consulting Physician (Cardiology)  Indicate any recent Medical Services you may have received from other than [facility] providers in the past year (date may be approximate).     Assessment:   This is a routine wellness examination for Kordel.  Hearing/Vision screen Hearing Screening - Comments:: Pt wears hearing aides in both ears  Vision Screening - Comments:: [place] ,[place] ,[place]   Dietary issues and exercise activities discussed: Current Exercise Habits: The patient does not participate in regular exercise at present, Type of exercise: Other - see comments (walks dog at home), Time (Minutes): 10, Frequency (Times/Week): 4, Weekly Exercise (Minutes/Week): 40, Intensity: Mild, Exercise limited by: cardiac condition(s)   Goals Addressed             This Visit's Progress    DIET - EAT MORE FRUITS AND VEGETABLES   On track    Patient Stated       Patient said that keep up with diet        Depression Screen    11/30/2022    1:14 PM 11/30/2022    1:07 PM 11/30/2022   10:42 AM 11/30/2022   10:36 AM 05/30/2022    1:16 PM 03/30/2022    4:07 PM 03/30/2022    4:01 PM  PHQ 2/9 Scores  PHQ - 2 Score 3 0 0 0 0 4 0  PHQ- 9 Score 6     10     Fall Risk    11/30/2022    1:07 PM 11/30/2022   10:42 AM 05/30/2022    1:15 PM 03/30/2022    4:01 PM 11/28/2021   11:58 AM  Fall Risk   Falls in the past year? 0 0 0 0 0  Number falls in past yr:  0   0  Injury with Fall?  0   0  Risk for fall due to :  No Fall Risks   Orthopedic patient;Impaired balance/gait  Follow up  Falls prevention discussed   Falls prevention discussed    FALL RISK PREVENTION PERTAINING TO THE HOME:  Any stairs in or around the home? No  If so, are there any without handrails? No  Home free of loose throw rugs in walkways, pet beds, electrical cords, etc? Yes  Adequate lighting in your home to reduce risk of falls? Yes   ASSISTIVE DEVICES UTILIZED TO PREVENT FALLS:  Life alert? No  Use of a  cane, walker or w/c? No  Grab bars in the bathroom? No  Shower chair or bench in shower? Yes  Elevated toilet seat or a handicapped toilet? Yes   TIMED UP AND GO:  Was the test performed? No .televisit      Cognitive Function:    04/24/2018    3:40 PM 01/19/2017    3:40 PM  MMSE - Mini Mental State Exam  Not completed:  Unable to complete  Orientation to time 5 5  Orientation to Place 5   Registration 3   Attention/ Calculation 0   Recall 3   Language- name 2 objects 2   Language- repeat 1   Language- follow 3 step command 3   Language- read & follow direction 0   Write a sentence 0   Copy design 1   Total score 23         11/30/2022   10:37 AM 11/28/2021   12:05 PM 11/26/2019   11:24 AM  6CIT Screen  What Year? 0 points 0 points 0 points  What month? 0 points 0 points 0 points  What time? 0 points 0 points 0 points  Count back from 20 0 points 0 points 0 points  Months in reverse 4  points 4 points 4 points  Repeat phrase 4 points 2 points 8 points  Total Score 8 points 6 points 12 points    Immunizations Immunization History  Administered Date(s) Administered   Fluad Quad(high Dose 65+) 05/12/2019, 05/26/2020, 05/30/2021, 05/30/2022   Influenza, High Dose Seasonal PF 06/25/2018   Influenza,inj,Quad PF,6+ Mos 05/08/2016, 07/23/2017   Moderna Sars-Covid-2 Vaccination 11/06/2019, 12/04/2019, 07/20/2020   Pneumococcal Conjugate-13 01/19/2017   Pneumococcal Polysaccharide-23 11/27/2012   Tdap 05/26/2020   Zoster Recombinat (Shingrix) 11/28/2021, 05/30/2022    TDAP status: Up to date  Flu Vaccine status: Up to date  Pneumococcal vaccine status: Up to date  Covid-19 vaccine status: Information provided on how to obtain vaccines.   Qualifies for Shingles Vaccine? Yes   Zostavax completed Yes   Shingrix Completed?: Yes  Screening Tests Health Maintenance  Topic Date Due   COVID-19 Vaccine (4 - 2023-24 season) 12/16/2022 (Originally 04/07/2022)   INFLUENZA VACCINE  03/08/2023   Lung Cancer Screening  04/12/2023   Medicare Annual Wellness (AWV)  11/30/2023   COLONOSCOPY (Pts 45-74yrs Insurance coverage will need to be confirmed)  06/09/2026   DTaP/Tdap/Td (2 - Td or Tdap) 05/26/2030   Pneumonia Vaccine 30+ Years old  Completed   Hepatitis C Screening  Completed   Zoster Vaccines- Shingrix  Completed   HPV VACCINES  Aged Out    Health Maintenance  There are no preventive care reminders to display for this patient.   Colorectal cancer screening: Type of screening: Colonoscopy. Completed 06/09/2021. Repeat every 10 years  Lung Cancer Screening: (Low Dose CT Chest recommended if Age 84-80 years, 30 pack-year currently smoking OR have quit w/in 15years.) does qualify.   Lung Cancer Screening Referral: will discuss at his appointment on 11/30/2022  Additional Screening:  Hepatitis C Screening: does qualify; Completed 10/29/2015  Vision Screening:  Recommended annual ophthalmology exams for early detection of glaucoma and other disorders of the eye. Is the patient up to date with their annual eye exam?  Yes  Who is the provider or what is the name of the office in which the patient attends annual eye exams? Pt is going make an appointment with his wife eye doctor in [facility] If pt is not established with a  provider, would they like to be referred to a provider to establish care? No .   Dental Screening: Recommended annual dental exams for proper oral hygiene  Community Resource Referral / Chronic Care Management: CRR required this visit?  No   CCM required this visit?  Yes      Plan:     I have personally reviewed and noted the following in the patient's chart:   Medical and social history Use of alcohol, tobacco or illicit drugs  Current medications and supplements including opioid prescriptions. Patient is not currently taking opioid prescriptions. Functional ability and status Nutritional status Physical activity Advanced directives List of other physicians Hospitalizations, surgeries, and ER visits in previous 12 months Vitals Screenings to include cognitive, depression, and falls Referrals and appointments  In addition, I have reviewed and discussed with patient certain preventive protocols, quality metrics, and best practice recommendations. A written personalized care plan for preventive services as well as general preventive health recommendations were provided to patient.     Annabell Sabal, CMA   12/01/2022   Nurse Notes: None , 6 CITwas abnormal at 8  patient has a follow up with PCP on today 11/30/22

## 2022-11-30 NOTE — Progress Notes (Addendum)
Subjective:  [name], male    DOB: December 02, 1945  Age: 77 y.o. MRN: [id number]  CC: Medical Management of Chronic Issues   HPI ARTHAS VASIL presents for  presents for  follow-up of hypertension. Patient has no history of headache chest pain or shortness of breath or recent cough. Patient also denies symptoms of TIA such as focal numbness or weakness. Patient denies side effects from medication. States taking it regularly.   in for follow-up of elevated cholesterol. Doing well without complaints on current medication. Denies side effects of statin including myalgia and arthralgia and nausea. Currently no chest pain, shortness of breath or other cardiovascular related symptoms noted.  Atrial fibrillation follow up. Pt. is treated with rate control and coumadin plus plavix for anticoagulation. Pt.  denies palpitations, rapid rate, chest pain, dyspnea and edema. There has been no bleeding from nose or gums. Pt. has not noticed blood with urine or stool.  Although there is routine bruising easily, it is not excessive.  Denies edema of legs. Energy is adequate. Stable on Jardiance, diuresis At baseline for dyspnea. Has a combination of COPD and CHF causing his dyspnea. Currently has no noted exacerbations of either. Exercise tolerance at baseline. Not whee      11/30/2022    1:14 PM 11/30/2022    1:07 PM 11/30/2022   10:42 AM  Depression screen PHQ 2/9  Decreased Interest 3 0 0  Down, Depressed, Hopeless 0 0 0  PHQ - 2 Score 3 0 0  Altered sleeping 0    Tired, decreased energy 3    Change in appetite 0    Feeling bad or failure about yourself  0    Trouble concentrating 0    Moving slowly or fidgety/restless 0    Suicidal thoughts 0    PHQ-9 Score 6    Difficult doing work/chores Not difficult at all      History Demontay has a past medical history of Anginal pain (HCC), Atrial fibrillation by electrocardiogram (HCC) (11/2019), Atrial flutter by electrocardiogram (HCC)  (11/2019), Bladder cancer (HCC) (01/2020), Bladder tumor, BPH (benign prostatic hyperplasia), CAD (coronary artery disease), COPD (chronic obstructive pulmonary disease) (HCC), Dyspnea, ETOH abuse, GERD (gastroesophageal reflux disease), History of gout, Hypercholesterolemia, Hypertension, Myocardial infarction (HCC) (2013), Prostate cancer (HCC), and Tobacco abuse.   He has a past surgical history that includes Prostate surgery; Colonoscopy; Back surgery; Esophagogastroduodenoscopy (05/27/2012); Cardiac catheterization (2011); Coronary angioplasty; Coronary artery bypass graft; Anterior cervical decompression/discectomy fusion 4 level (N/A, 07/21/2016); Colonoscopy (N/A, 11/21/2017); polypectomy (11/21/2017); Transurethral resection of bladder tumor with mitomycin-c (Left, 01/06/2020); Cardioversion (N/A, 06/10/2020); Colonoscopy with propofol (N/A, 06/09/2021); polypectomy (06/09/2021); RIGHT/LEFT HEART CATH AND CORONARY/GRAFT ANGIOGRAPHY (N/A, 03/27/2022); and CORONARY STENT INTERVENTION (N/A, 03/27/2022).   His family history includes Diabetes in his brother, brother, and mother; Healthy in his son; Heart disease in his brother and brother; Hypertension in his father; Multiple sclerosis in his daughter.He reports that he has been smoking cigarettes and e-cigarettes. He started smoking about 52 years ago. He has a 58.00 pack-year smoking history. He has never been exposed to tobacco smoke. He quit smokeless tobacco use about 10 years ago.  His smokeless tobacco use included snuff. He reports current alcohol use of about 2.0 - 4.0 standard drinks of alcohol per week. He reports that he does not use drugs.    ROS Review of Systems  Constitutional:  Negative for fever.  Respiratory:  Positive for shortness of breath.   Cardiovascular:  Negative  for chest pain.  Musculoskeletal:  Positive for arthralgias (knee), back pain and myalgias (leg cramps, mostly at night. Awaken him).  Skin:  Negative for rash.     Objective:  BP 101/60   Pulse 78   Temp 97.8 F (36.6 C)   Ht 5\' 6"  (1.676 m)   Wt 147 lb 6.4 oz (66.9 kg)   SpO2 98%   BMI 23.79 kg/m   BP Readings from Last 3 Encounters:  12/07/22 124/70  11/30/22 101/60  11/06/22 118/78    Wt Readings from Last 3 Encounters:  12/07/22 147 lb (66.7 kg)  11/30/22 147 lb 6.4 oz (66.9 kg)  11/30/22 148 lb (67.1 kg)     Physical Exam Constitutional:      General: He is not in acute distress.    Appearance: He is well-developed.  HENT:     Head: Normocephalic and atraumatic.     Right Ear: External ear normal.     Left Ear: External ear normal.     Nose: Nose normal.  Eyes:     Conjunctiva/sclera: Conjunctivae normal.     Pupils: Pupils are equal, round, and reactive to light.  Cardiovascular:     Rate and Rhythm: Normal rate and regular rhythm.     Heart sounds: Normal heart sounds. No murmur heard. Pulmonary:     Effort: Pulmonary effort is normal. No respiratory distress.     Breath sounds: Normal breath sounds. No wheezing or rales.  Abdominal:     Palpations: Abdomen is soft.     Tenderness: There is no abdominal tenderness.  Musculoskeletal:        General: Normal range of motion.     Cervical back: Normal range of motion and neck supple.  Skin:    General: Skin is warm and dry.  Neurological:     Mental Status: He is alert and oriented to person, place, and time.     Deep Tendon Reflexes: Reflexes are normal and symmetric.  Psychiatric:        Behavior: Behavior normal.        Thought Content: Thought content normal.        Judgment: Judgment normal.       Assessment & Plan:   Ashutosh was seen today for medical management of chronic issues.  Diagnoses and all orders for this visit:  Essential hypertension -     CBC with Differential/Platelet -     CMP14+EGFR -     Urinalysis  Chronic systolic heart failure (HCC) -     CBC with Differential/Platelet -     CMP14+EGFR  Chronic obstructive pulmonary  disease, unspecified COPD type (HCC) -     CBC with Differential/Platelet -     CMP14+EGFR  Anemia, unspecified type -     CBC with Differential/Platelet -     CMP14+EGFR  Leg cramps, sleep related -     CBC with Differential/Platelet -     CMP14+EGFR -     Magnesium -     Phosphorus  Mixed hyperlipidemia -     Lipid panel  Acute gout of left knee, unspecified cause -     Uric acid  Longstanding persistent atrial fibrillation (HCC)  Acute midline low back pain without sciatica -     DG Lumbar Spine 2-3 Views; Future       I am having [name] maintain his Vitamin D3, acetaminophen, nitroGLYCERIN, fluticasone, B Complex Vitamins (VITAMIN B COMPLEX PO), Breztri Aerosphere, furosemide, allopurinol, pantoprazole,  albuterol, amLODipine, atorvastatin, clopidogrel, losartan, metoprolol succinate, spironolactone, empagliflozin, and warfarin.  Allergies as of 11/30/2022   No Known Allergies      Medication List        Accurate as of November 30, 2022 11:59 PM. If you have any questions, ask your nurse or doctor.          acetaminophen 500 MG tablet Commonly known as: TYLENOL Take 2 tablets (1,000 mg total) by mouth every 6 (six) hours as needed for moderate pain or headache.   albuterol 108 (90 Base) MCG/ACT inhaler Commonly known as: Ventolin HFA Inhale 2 puffs into the lungs every 6 (six) hours as needed for wheezing or shortness of breath.   allopurinol 100 MG tablet Commonly known as: ZYLOPRIM Take 1 tablet (100 mg total) by mouth daily.   amLODipine 5 MG tablet Commonly known as: NORVASC Take 1 tablet (5 mg total) by mouth daily.   atorvastatin 80 MG tablet Commonly known as: LIPITOR TAKE 1 TABLET EVERY DAY AT 6 PM   Breztri Aerosphere 160-9-4.8 MCG/ACT Aero Generic drug: Budeson-Glycopyrrol-Formoterol Take 2 puffs by mouth in the morning and at bedtime. Dispense 3 inhalers   clopidogrel 75 MG tablet Commonly known as: PLAVIX Take 1 tablet (75 mg  total) by mouth daily. Continue this for 1 year   empagliflozin 10 MG Tabs tablet Commonly known as: Jardiance Take 1 tablet (10 mg total) by mouth daily before breakfast.   fluticasone 50 MCG/ACT nasal spray Commonly known as: FLONASE Place 2 sprays into both nostrils daily as needed for allergies.   furosemide 20 MG tablet Commonly known as: LASIX Take 1 tablet (20 mg total) by mouth as needed (swelling).   losartan 25 MG tablet Commonly known as: COZAAR Take 1 tablet (25 mg total) by mouth daily.   metoprolol succinate 50 MG 24 hr tablet Commonly known as: Toprol XL Take 1.5 tablets (75 mg total) by mouth daily.   nitroGLYCERIN 0.4 MG SL tablet Commonly known as: NITROSTAT Place 1 tablet (0.4 mg total) under the tongue every 5 (five) minutes as needed for chest pain.   pantoprazole 40 MG tablet Commonly known as: PROTONIX Take 1 tablet (40 mg total) by mouth daily.   spironolactone 25 MG tablet Commonly known as: Aldactone Take 0.5 tablets (12.5 mg total) by mouth daily.   VITAMIN B COMPLEX PO Take 1 tablet by mouth daily.   Vitamin D3 125 MCG (5000 UT) Caps Take 5,000 Units by mouth daily.   warfarin 5 MG tablet Commonly known as: COUMADIN Take as directed by the anticoagulation clinic. If you are unsure how to take this medication, talk to your nurse or doctor. Original instructions: Take 5 mg by mouth daily. Take 1/2 - to 1 tablet daily or as directed by coumadin clinic.         Follow-up: Return in about 6 months (around 06/01/2023) for hypertension.  Mechele Claude, M.D.

## 2022-11-30 NOTE — Patient Instructions (Addendum)
[name] , Thank you for taking time to come for your Medicare Wellness Visit. I appreciate your ongoing commitment to your health goals. Please review the following plan we discussed and let me know if I can assist you in the future.   These are the goals we discussed:  Goals      DIET - EAT MORE FRUITS AND VEGETABLES     DIET - INCREASE WATER INTAKE     Increase water intake to 48 oz per day      Exercise 150 minutes per week (moderate activity)     Have 3 meals a day     Patient Stated     Patient said that keep up with diet      Quit smoking / using tobacco     Goals Addressed             This Visit's Progress    Quit smoking / using tobacco   Not on track              This is a list of the screening recommended for you and due dates:  Health Maintenance  Topic Date Due   COVID-19 Vaccine (4 - 2023-24 season) 04/07/2022   Flu Shot  03/08/2023   Screening for Lung Cancer  04/12/2023   Medicare Annual Wellness Visit  11/30/2023   Colon Cancer Screening  06/09/2026   DTaP/Tdap/Td vaccine (2 - Td or Tdap) 05/26/2030   Pneumonia Vaccine  Completed   Hepatitis C Screening: USPSTF Recommendation to screen - Ages 16-79 yo.  Completed   Zoster (Shingles) Vaccine  Completed   HPV Vaccine  Aged Out    Advanced directives: Advance directive discussed with you today. I have provided a copy for you to complete at home and have notarized. Once this is complete please bring a copy in to our office so we can scan it into your chart.   Conditions/risks identified: Aim for 30 minutes of exercise or brisk walking, 6-8 glasses of water, and 5 servings of fruits and vegetables each day.   Next appointment: Follow up in one year for your annual wellness visit.  Preventive Care 31 Years and Older, Male  Preventive care refers to lifestyle choices and visits with your health care provider that can promote health and wellness. What does preventive care include? A yearly physical  exam. This is also called an annual well check. Dental exams once or twice a year. Routine eye exams. Ask your health care provider how often you should have your eyes checked. Personal lifestyle choices, including: Daily care of your teeth and gums. Regular physical activity. Eating a healthy diet. Avoiding tobacco and drug use. Limiting alcohol use. Practicing safe sex. Taking low doses of aspirin every day. Taking vitamin and mineral supplements as recommended by your health care provider. What happens during an annual well check? The services and screenings done by your health care provider during your annual well check will depend on your age, overall health, lifestyle risk factors, and family history of disease. Counseling  Your health care provider may ask you questions about your: Alcohol use. Tobacco use. Drug use. Emotional well-being. Home and relationship well-being. Sexual activity. Eating habits. History of falls. Memory and ability to understand (cognition). Work and work Astronomer. Screening  You may have the following tests or measurements: Height, weight, and BMI. Blood pressure. Lipid and cholesterol levels. These may be checked every 5 years, or more frequently if you are over 50 years  old. Skin check. Lung cancer screening. You may have this screening every year starting at age 10 if you have a 30-pack-year history of smoking and currently smoke or have quit within the past 15 years. Fecal occult blood test (FOBT) of the stool. You may have this test every year starting at age 38. Flexible sigmoidoscopy or colonoscopy. You may have a sigmoidoscopy every 5 years or a colonoscopy every 10 years starting at age 23. Prostate cancer screening. Recommendations will vary depending on your family history and other risks. Hepatitis C blood test. Hepatitis B blood test. Sexually transmitted disease (STD) testing. Diabetes screening. This is done by checking your  blood sugar (glucose) after you have not eaten for a while (fasting). You may have this done every 1-3 years. Abdominal aortic aneurysm (AAA) screening. You may need this if you are a current or former smoker. Osteoporosis. You may be screened starting at age 73 if you are at high risk. Talk with your health care provider about your test results, treatment options, and if necessary, the need for more tests. Vaccines  Your health care provider may recommend certain vaccines, such as: Influenza vaccine. This is recommended every year. Tetanus, diphtheria, and acellular pertussis (Tdap, Td) vaccine. You may need a Td booster every 10 years. Zoster vaccine. You may need this after age 88. Pneumococcal 13-valent conjugate (PCV13) vaccine. One dose is recommended after age 64. Pneumococcal polysaccharide (PPSV23) vaccine. One dose is recommended after age 17. Talk to your health care provider about which screenings and vaccines you need and how often you need them. This information is not intended to replace advice given to you by your health care provider. Make sure you discuss any questions you have with your health care provider. Document Released: 08/20/2015 Document Revised: 04/12/2016 Document Reviewed: 05/25/2015 Elsevier Interactive Patient Education  2017 [employer].  Fall Prevention in the Home Falls can cause injuries. They can happen to people of all ages. There are many things you can do to make your home safe and to help prevent falls. What can I do on the outside of my home? Regularly fix the edges of walkways and driveways and fix any cracks. Remove anything that might make you trip as you walk through a door, such as a raised step or threshold. Trim any bushes or trees on the path to your home. Use bright outdoor lighting. Clear any walking paths of anything that might make someone trip, such as rocks or tools. Regularly check to see if handrails are loose or broken. Make sure  that both sides of any steps have handrails. Any raised decks and porches should have guardrails on the edges. Have any leaves, snow, or ice cleared regularly. Use sand or salt on walking paths during winter. Clean up any spills in your garage right away. This includes oil or grease spills. What can I do in the bathroom? Use night lights. Install grab bars by the toilet and in the tub and shower. Do not use towel bars as grab bars. Use non-skid mats or decals in the tub or shower. If you need to sit down in the shower, use a plastic, non-slip stool. Keep the floor dry. Clean up any water that spills on the floor as soon as it happens. Remove soap buildup in the tub or shower regularly. Attach bath mats securely with double-sided non-slip rug tape. Do not have throw rugs and other things on the floor that can make you trip. What can I do  in the bedroom? Use night lights. Make sure that you have a light by your bed that is easy to reach. Do not use any sheets or blankets that are too big for your bed. They should not hang down onto the floor. Have a firm chair that has side arms. You can use this for support while you get dressed. Do not have throw rugs and other things on the floor that can make you trip. What can I do in the kitchen? Clean up any spills right away. Avoid walking on wet floors. Keep items that you use a lot in easy-to-reach places. If you need to reach something above you, use a strong step stool that has a grab bar. Keep electrical cords out of the way. Do not use floor polish or wax that makes floors slippery. If you must use wax, use non-skid floor wax. Do not have throw rugs and other things on the floor that can make you trip. What can I do with my stairs? Do not leave any items on the stairs. Make sure that there are handrails on both sides of the stairs and use them. Fix handrails that are broken or loose. Make sure that handrails are as long as the  stairways. Check any carpeting to make sure that it is firmly attached to the stairs. Fix any carpet that is loose or worn. Avoid having throw rugs at the top or bottom of the stairs. If you do have throw rugs, attach them to the floor with carpet tape. Make sure that you have a light switch at the top of the stairs and the bottom of the stairs. If you do not have them, ask someone to add them for you. What else can I do to help prevent falls? Wear shoes that: Do not have high heels. Have rubber bottoms. Are comfortable and fit you well. Are closed at the toe. Do not wear sandals. If you use a stepladder: Make sure that it is fully opened. Do not climb a closed stepladder. Make sure that both sides of the stepladder are locked into place. Ask someone to hold it for you, if possible. Clearly mark and make sure that you can see: Any grab bars or handrails. First and last steps. Where the edge of each step is. Use tools that help you move around (mobility aids) if they are needed. These include: Canes. Walkers. Scooters. Crutches. Turn on the lights when you go into a dark area. Replace any light bulbs as soon as they burn out. Set up your furniture so you have a clear path. Avoid moving your furniture around. If any of your floors are uneven, fix them. If there are any pets around you, be aware of where they are. Review your medicines with your doctor. Some medicines can make you feel dizzy. This can increase your chance of falling. Ask your doctor what other things that you can do to help prevent falls. This information is not intended to replace advice given to you by your health care provider. Make sure you discuss any questions you have with your health care provider. Document Released: 05/20/2009 Document Revised: 12/30/2015 Document Reviewed: 08/28/2014 Elsevier Interactive Patient Education  2017 [employer].

## 2022-12-01 LAB — CMP14+EGFR
ALT: 23 IU/L (ref 0–44)
AST: 32 IU/L (ref 0–40)
Albumin/Globulin Ratio: 1.6 (ref 1.2–2.2)
Albumin: 4.1 g/dL (ref 3.8–4.8)
Alkaline Phosphatase: 107 IU/L (ref 44–121)
BUN/Creatinine Ratio: 8 — ABNORMAL LOW (ref 10–24)
BUN: 9 mg/dL (ref 8–27)
Bilirubin Total: 0.3 mg/dL (ref 0.0–1.2)
CO2: 21 mmol/L (ref 20–29)
Calcium: 9.5 mg/dL (ref 8.6–10.2)
Chloride: 101 mmol/L (ref 96–106)
Creatinine, Ser: 1.1 mg/dL (ref 0.76–1.27)
Globulin, Total: 2.6 g/dL (ref 1.5–4.5)
Glucose: 94 mg/dL (ref 70–99)
Potassium: 4.4 mmol/L (ref 3.5–5.2)
Sodium: 142 mmol/L (ref 134–144)
Total Protein: 6.7 g/dL (ref 6.0–8.5)
eGFR: 70 mL/min/{1.73_m2} (ref >59)

## 2022-12-01 LAB — CBC WITH DIFFERENTIAL/PLATELET
Basophils Absolute: 0.1 10*3/uL (ref 0.0–0.2)
Basos: 1 %
EOS (ABSOLUTE): 0.1 10*3/uL (ref 0.0–0.4)
Eos: 1 %
Hematocrit: 39.7 % (ref 37.5–51.0)
Hemoglobin: 13.7 g/dL (ref 13.0–17.7)
Immature Grans (Abs): 0 10*3/uL (ref 0.0–0.1)
Immature Granulocytes: 1 %
Lymphocytes Absolute: 2.2 10*3/uL (ref 0.7–3.1)
Lymphs: 25 %
MCH: 34.1 pg — ABNORMAL HIGH (ref 26.6–33.0)
MCHC: 34.5 g/dL (ref 31.5–35.7)
MCV: 99 fL — ABNORMAL HIGH (ref 79–97)
Monocytes Absolute: 0.6 10*3/uL (ref 0.1–0.9)
Monocytes: 7 %
Neutrophils Absolute: 5.8 10*3/uL (ref 1.4–7.0)
Neutrophils: 65 %
Platelets: 235 10*3/uL (ref 150–450)
RBC: 4.02 x10E6/uL — ABNORMAL LOW (ref 4.14–5.80)
RDW: 13.8 % (ref 11.6–15.4)
WBC: 8.8 10*3/uL (ref 3.4–10.8)

## 2022-12-01 LAB — PHOSPHORUS: Phosphorus: 2.6 mg/dL — ABNORMAL LOW (ref 2.8–4.1)

## 2022-12-01 LAB — LIPID PANEL
Chol/HDL Ratio: 2.8 ratio (ref 0.0–5.0)
Cholesterol, Total: 141 mg/dL (ref 100–199)
HDL: 50 mg/dL (ref >39)
LDL Chol Calc (NIH): 66 mg/dL (ref 0–99)
Triglycerides: 143 mg/dL (ref 0–149)
VLDL Cholesterol Cal: 25 mg/dL (ref 5–40)

## 2022-12-01 LAB — MAGNESIUM: Magnesium: 1.7 mg/dL (ref 1.6–2.3)

## 2022-12-01 LAB — URIC ACID: Uric Acid: 3.6 mg/dL — ABNORMAL LOW (ref 3.8–8.4)

## 2022-12-04 ENCOUNTER — Ambulatory Visit: Payer: Medicare HMO | Attending: Cardiology | Admitting: *Deleted

## 2022-12-04 DIAGNOSIS — I4891 Unspecified atrial fibrillation: Secondary | ICD-10-CM | POA: Diagnosis not present

## 2022-12-04 DIAGNOSIS — Z5181 Encounter for therapeutic drug level monitoring: Secondary | ICD-10-CM | POA: Diagnosis not present

## 2022-12-04 LAB — POCT INR: INR: 4.3 — AB (ref 2.0–3.0)

## 2022-12-04 NOTE — Patient Instructions (Signed)
Hold warfarin tonight then decrease dose to 1/2 tablet daily Recheck in 2 weeks.

## 2022-12-05 ENCOUNTER — Other Ambulatory Visit: Payer: Self-pay | Admitting: Family Medicine

## 2022-12-05 MED ORDER — PHOSPHORUS SUPPLEMENT 280-160-250 MG PO PACK: 1.0000 | PACK | Freq: Every day | ORAL | 1 refills | Status: DC

## 2022-12-07 ENCOUNTER — Encounter: Payer: Self-pay | Admitting: Adult Health

## 2022-12-07 ENCOUNTER — Ambulatory Visit (INDEPENDENT_AMBULATORY_CARE_PROVIDER_SITE_OTHER): Payer: Medicare HMO

## 2022-12-07 ENCOUNTER — Ambulatory Visit: Payer: Medicare HMO | Admitting: Adult Health

## 2022-12-07 ENCOUNTER — Encounter: Payer: Self-pay | Admitting: Family Medicine

## 2022-12-07 VITALS — BP 124/70 | HR 73 | Temp 97.7°F | Ht 66.0 in | Wt 147.0 lb

## 2022-12-07 DIAGNOSIS — J449 Chronic obstructive pulmonary disease, unspecified: Secondary | ICD-10-CM | POA: Diagnosis not present

## 2022-12-07 DIAGNOSIS — R5381 Other malaise: Secondary | ICD-10-CM | POA: Insufficient documentation

## 2022-12-07 DIAGNOSIS — J441 Chronic obstructive pulmonary disease with (acute) exacerbation: Secondary | ICD-10-CM | POA: Diagnosis not present

## 2022-12-07 DIAGNOSIS — J439 Emphysema, unspecified: Secondary | ICD-10-CM | POA: Diagnosis not present

## 2022-12-07 DIAGNOSIS — I5022 Chronic systolic (congestive) heart failure: Secondary | ICD-10-CM | POA: Diagnosis not present

## 2022-12-07 MED ORDER — PREDNISONE 10 MG PO TABS: ORAL_TABLET | ORAL | 0 refills | Status: DC

## 2022-12-07 NOTE — Assessment & Plan Note (Signed)
Appears euvolemic on exam.  Continue current regimen and follow-up with cardiology 

## 2022-12-07 NOTE — Progress Notes (Signed)
@Patient  ID: [name], male    DOB: 07-07-1946, 77 y.o.   MRN: [id number]  Chief Complaint  Patient presents with   Follow-up    Referring provider: Mechele Claude, MD  HPI: 77 year old male active smoker followed for COPD with emphysema and lung nodule Participates in the lung cancer CT chest screening program History significant for coronary artery disease status post CABG, A-fib/a flutter on coumadin , congestive heart failure bladder and prostate cancer  TEST/EVENTS :  Lung cancer screening CT done July 26, 2020 showed a new 6.8 mm right apical pulmonary nodule, stable emphysema.   PFTs June 02, 2021 showed mild obstruction and decreased diffusing capacity (FEV1 79%, ratio 68, FVC 84%, no significant bronchodilator response, DLCO 61%  2D echo November 2023 shows improved EF at 45 to 50%, normal pulmonary artery systolic pressure, left atrium moderately dilated (previous EF was 40 to 45%)  12/07/2022 Acute OV  ; COPD emphysema, lung nodule Patient presents for a work in visit.  Patient has COPD with emphysema complains of 2 months of increased shortness of breath, decreased activity tolerance. Wears out easily. Has hard time walking any amount of distance. Over last year has slowly been going down hill.  Has chronic daily cough in am. Has daily wheezing, uses albuterol inhaler 3-4 times a day.  Patient remains on Breztri twice daily.  Has been seen by cardiology and primary care and told that his issues are with his lungs.  Participates in the Lung cancer CT chest screening program CT chest April 11, 2022 showed moderate emphysema, several scattered small pulmonary nodules largest measuring 2.8 mm in the right apex no suspicious nodules identified. Patient continues smoke.  We discussed smoking cessation. Can mow grass on riding lawn mower. Just can't walk any distance without giving out. Legs get weak and heavy . Walk test in office shows no significant desats with O2  sats 96%  with ambulation  on room air and 100% at rest  on room air.  Discussed pulmonary rehab referral declines at this time.  On coumadin for A Fib- last INR 4.3 on 12/04/22 . Coumadin clinic adjusted dose. H/H ok on 11/30/22.    No Known Allergies  Immunization History  Administered Date(s) Administered   Fluad Quad(high Dose 65+) 05/12/2019, 05/26/2020, 05/30/2021, 05/30/2022   Influenza, High Dose Seasonal PF 06/25/2018   Influenza,inj,Quad PF,6+ Mos 05/08/2016, 07/23/2017   Moderna Sars-Covid-2 Vaccination 11/06/2019, 12/04/2019, 07/20/2020   Pneumococcal Conjugate-13 01/19/2017   Pneumococcal Polysaccharide-23 11/27/2012   Tdap 05/26/2020   Zoster Recombinat (Shingrix) 11/28/2021, 05/30/2022    Past Medical History:  Diagnosis Date   Anginal pain (HCC)    Atrial fibrillation by electrocardiogram (HCC) 11/2019   Atrial flutter by electrocardiogram (HCC) 11/2019   Bladder cancer (HCC) 01/2020   Bladder tumor    BPH (benign prostatic hyperplasia)    CAD (coronary artery disease)    a. s/p INF MI 1997;  b. s/p CABG 2001;  c. LHC 11/2009:  3v CAD, S-PDA ok with 40% mid, S-OM ok, S-Dx ok, L-LAD ok, EF 50%;  d.  Lex MV 5/14:  Inferolateral scar, EF 46%, no ischemia   COPD (chronic obstructive pulmonary disease) (HCC)    Dyspnea    ETOH abuse    GERD (gastroesophageal reflux disease)    History of gout    Hypercholesterolemia    Hypertension    Myocardial infarction [facility]) 2013   Prostate cancer (HCC)    Tobacco abuse  Tobacco History: Social History   Tobacco Use  Smoking Status Every Day   Packs/day: 1.00   Years: 58.00   Additional pack years: 0.00   Total pack years: 58.00   Types: Cigarettes, E-cigarettes   Start date: 03/08/1970   Passive exposure: Never  Smokeless Tobacco Former   Types: Snuff   Quit date: 03/08/2012  Tobacco Comments   1/2 ppd 12/07/22 ep   Ready to quit: No Counseling given: Yes Tobacco comments: 1/2 ppd 12/07/22 ep   Outpatient  Medications Prior to Visit  Medication Sig Dispense Refill   acetaminophen (TYLENOL) 500 MG tablet Take 2 tablets (1,000 mg total) by mouth every 6 (six) hours as needed for moderate pain or headache. 90 tablet 0   albuterol (VENTOLIN HFA) 108 (90 Base) MCG/ACT inhaler Inhale 2 puffs into the lungs every 6 (six) hours as needed for wheezing or shortness of breath. 3 each 3   allopurinol (ZYLOPRIM) 100 MG tablet Take 1 tablet (100 mg total) by mouth daily. 90 tablet 3   amLODipine (NORVASC) 5 MG tablet Take 1 tablet (5 mg total) by mouth daily. 90 tablet 3   atorvastatin (LIPITOR) 80 MG tablet TAKE 1 TABLET EVERY DAY AT 6 PM 90 tablet 3   B Complex Vitamins (VITAMIN B COMPLEX PO) Take 1 tablet by mouth daily.     Budeson-Glycopyrrol-Formoterol (BREZTRI AEROSPHERE) 160-9-4.8 MCG/ACT AERO Take 2 puffs by mouth in the morning and at bedtime. Dispense 3 inhalers 32.1 g 5   Cholecalciferol (VITAMIN D3) 125 MCG (5000 UT) CAPS Take 5,000 Units by mouth daily.     clopidogrel (PLAVIX) 75 MG tablet Take 1 tablet (75 mg total) by mouth daily. Continue this for 1 year 90 tablet 3   empagliflozin (JARDIANCE) 10 MG TABS tablet Take 1 tablet (10 mg total) by mouth daily before breakfast. 30 tablet 11   fluticasone (FLONASE) 50 MCG/ACT nasal spray Place 2 sprays into both nostrils daily as needed for allergies. 48 g 2   furosemide (LASIX) 20 MG tablet Take 1 tablet (20 mg total) by mouth as needed (swelling). 30 tablet 2   losartan (COZAAR) 25 MG tablet Take 1 tablet (25 mg total) by mouth daily. 90 tablet 3   metoprolol succinate (TOPROL XL) 50 MG 24 hr tablet Take 1.5 tablets (75 mg total) by mouth daily. 135 tablet 3   nitroGLYCERIN (NITROSTAT) 0.4 MG SL tablet Place 1 tablet (0.4 mg total) under the tongue every 5 (five) minutes as needed for chest pain. 75 tablet 2   pantoprazole (PROTONIX) 40 MG tablet Take 1 tablet (40 mg total) by mouth daily. 90 tablet 2   Potassium & Sodium Phosphates (PHOSPHORUS  SUPPLEMENT) 280-160-250 MG PACK Take 1 packet by mouth daily with breakfast. 90 each 1   spironolactone (ALDACTONE) 25 MG tablet Take 0.5 tablets (12.5 mg total) by mouth daily. 45 tablet 3   warfarin (COUMADIN) 5 MG tablet Take 5 mg by mouth daily. Take 1/2 - to 1 tablet daily or as directed by coumadin clinic.     No facility-administered medications prior to visit.     Review of Systems:   Constitutional:   No  weight loss, night sweats,  Fevers, chills,  +fatigue, or  lassitude.  HEENT:   No headaches,  Difficulty swallowing,  Tooth/dental problems, or  Sore throat,                No sneezing, itching, ear ache, nasal congestion, post nasal drip,  CV:  No chest pain,  Orthopnea, PND, swelling in lower extremities, anasarca, dizziness, palpitations, syncope.   GI  No heartburn, indigestion, abdominal pain, nausea, vomiting, diarrhea, change in bowel habits, loss of appetite, bloody stools.   Resp:  No chest wall deformity  Skin: no rash or lesions.  GU: no dysuria, change in color of urine, no urgency or frequency.  No flank pain, no hematuria   MS:  No joint pain or swelling.  No decreased range of motion.  No back pain.    Physical Exam  BP 124/70 (BP Location: Left Arm, Patient Position: Sitting, Cuff Size: Normal)   Pulse 73   Temp 97.7 F (36.5 C) (Oral)   Ht 5\' 6"  (1.676 m)   Wt 147 lb (66.7 kg)   SpO2 100% Comment: RA  BMI 23.73 kg/m   GEN: A/Ox3; pleasant , NAD, well nourished    HEENT:  [place]/AT,   NOSE-clear, THROAT-clear, no lesions, no postnasal drip or exudate noted.   NECK:  Supple w/ fair ROM; no JVD; normal carotid impulses w/o bruits; no thyromegaly or nodules palpated; no lymphadenopathy.    RESP  Clear  P & A; w/o, wheezes/ rales/ or rhonchi. no accessory muscle use, no dullness to percussion  CARD:  RRR, no m/r/g, no peripheral edema, pulses intact, no cyanosis or clubbing.  GI:   Soft & nt; nml bowel sounds; no organomegaly or masses detected.    Musco: Warm bil, no deformities or joint swelling noted.   Neuro: alert, no focal deficits noted.    Skin: Warm, no lesions or rashes    Lab Results:  CBC    Component Value Date/Time   WBC 8.8 11/30/2022 1352   WBC 5.0 04/05/2022 1354   RBC 4.02 (L) 11/30/2022 1352   RBC 3.34 (L) 04/05/2022 1354   HGB 13.7 11/30/2022 1352   HCT 39.7 11/30/2022 1352   PLT 235 11/30/2022 1352   MCV 99 (H) 11/30/2022 1352   MCH 34.1 (H) 11/30/2022 1352   MCH 34.7 (H) 04/05/2022 1354   MCHC 34.5 11/30/2022 1352   MCHC 34.1 04/05/2022 1354   RDW 13.8 11/30/2022 1352   LYMPHSABS 2.2 11/30/2022 1352   MONOABS 0.4 03/25/2022 1909   EOSABS 0.1 11/30/2022 1352   BASOSABS 0.1 11/30/2022 1352    BMET    Component Value Date/Time   NA 142 11/30/2022 1352   K 4.4 11/30/2022 1352   CL 101 11/30/2022 1352   CO2 21 11/30/2022 1352   GLUCOSE 94 11/30/2022 1352   GLUCOSE 84 03/28/2022 0340   BUN 9 11/30/2022 1352   CREATININE 1.10 11/30/2022 1352   CREATININE 0.88 03/12/2015 1033   CALCIUM 9.5 11/30/2022 1352   GFRNONAA >60 03/28/2022 0340   GFRAA 97 05/26/2020 1106    BNP    Component Value Date/Time   BNP 146.4 (H) 11/28/2021 1345    ProBNP No results found for: "PROBNP"  Imaging: DG Chest 2 View  Result Date: 12/07/2022 CLINICAL DATA:  COPD exacerbation EXAM: CHEST - 2 VIEW COMPARISON:  03/25/2022 FINDINGS: Normal heart size post CABG. Mediastinal contours and pulmonary vascularity normal. Emphysematous and bronchitic changes consistent with COPD. Linear scarring RIGHT upper lobe. BILATERAL nipple shadows unchanged since 03/03/2008. No acute infiltrate, pleural effusion, or pneumothorax. Prior cervical spine fusion. No acute osseous findings. IMPRESSION: COPD changes with linear scarring in RIGHT upper lobe. No acute abnormalities. Emphysema (ICD10-J43.9). Electronically Signed   By: Ulyses Southward M.D.   On: 12/07/2022 10:26  DG Lumbar Spine 2-3 Views  Result Date:  12/04/2022 CLINICAL DATA:  Low back pain. EXAM: LUMBAR SPINE - 2-3 VIEW COMPARISON:  CT abdomen pelvis 10/16/2019 FINDINGS: Leftward curvature lumbar spine. Stable height loss of the L3 and L4 vertebral bodies. Multilevel lower lumbar spine degenerative disc disease most pronounced L4-5 and L5-S1. Lower lumbar spine facet degenerative changes. SI joints are unremarkable. Aortic atherosclerosis. IMPRESSION: 1. Lower lumbar spine degenerative disc and facet disease. 2. Chronic height loss of the L3 and L4 vertebral bodies. Electronically Signed   By: Annia Belt M.D.   On: 12/04/2022 11:20         Latest Ref Rng & Units 06/02/2021    2:30 PM  PFT Results  FVC-Pre L 3.00   FVC-Predicted Pre % 82   FVC-Post L 3.10   FVC-Predicted Post % 84   Pre FEV1/FVC % % 70   Post FEV1/FCV % % 68   FEV1-Pre L 2.11   FEV1-Predicted Pre % 79   FEV1-Post L 2.11   DLCO uncorrected ml/min/mmHg 13.86   DLCO UNC% % 61   DLCO corrected ml/min/mmHg 13.86   DLCO COR %Predicted % 61   DLVA Predicted % 56   TLC L 6.51   TLC % Predicted % 104   RV % Predicted % 150     No results found for: "NITRICOXIDE"      Assessment & Plan:   COPD (chronic obstructive pulmonary disease) (HCC) Possible COPD exacerbation plus or minus progressive COPD along with deconditioning..  Previous PFTs in 2022 showed only minimal airflow obstruction.  Decreased diffusing capacity consistent with COPD with emphysema.  Low suspicion for possible PE as supratherapeutic on Coumadin. H/H ok -no anemia . Echo in Nov was improved. Exam does not appear to be acute volume overload.  Will treat with empiric course of steroids..  Check spirometry with DLCO on return.  Check chest x-ray today. Declines pulmonary rehab at this time. If symptoms persist -consider arterial Dopplers as patient complains of leg issues with ambulation  Plan  Patient Instructions  Chest xray today  Prednisone taper over next week.  Continue on BREZTRI 2 puffs  Twice daily  , rinse after use.  Albuterol inhaler As needed   Activity as tolerated.  Work on not smoking  Call back if you change your mind on pulmonary rehab  Follow up with Dr. Delton Coombes in 6 weeks with Spirometry with DLCO . and As needed   Please contact office for sooner follow up if symptoms do not improve or worsen or seek emergency care      Chronic systolic heart failure (HCC) Appears euvolemic on exam.  Continue current regimen and follow-up with cardiology  Physical deconditioning Physical deconditioning.  We discussed pulmonary rehab however he declines at this time.  Advised on activity as tolerated. Seems to be limited by back and leg issues.  If continues would recommend follow back up with primary care plus or minus referral to orthopedics.  Also could consider arterial Dopplers to rule out peripheral artery disease.    I spent   43 minutes dedicated to the care of this patient on the date of this encounter to include pre-visit review of records, face-to-face time with the patient discussing conditions above, post visit ordering of testing, clinical documentation with the electronic health record, making appropriate referrals as documented, and communicating necessary findings to members of the patients care team.   Rubye Oaks, NP 12/07/2022

## 2022-12-07 NOTE — Assessment & Plan Note (Signed)
Physical deconditioning.  We discussed pulmonary rehab however he declines at this time.  Advised on activity as tolerated. Seems to be limited by back and leg issues.  If continues would recommend follow back up with primary care plus or minus referral to orthopedics.  Also could consider arterial Dopplers to rule out peripheral artery disease.

## 2022-12-07 NOTE — Assessment & Plan Note (Addendum)
Possible COPD exacerbation plus or minus progressive COPD along with deconditioning..  Previous PFTs in 2022 showed only minimal airflow obstruction.  Decreased diffusing capacity consistent with COPD with emphysema.  Low suspicion for possible PE as supratherapeutic on Coumadin. H/H ok -no anemia . Echo in Nov was improved. Exam does not appear to be acute volume overload.  Will treat with empiric course of steroids..  Check spirometry with DLCO on return.  Check chest x-ray today. Declines pulmonary rehab at this time. If symptoms persist -consider arterial Dopplers as patient complains of leg issues with ambulation  Plan  Patient Instructions  Chest xray today  Prednisone taper over next week.  Continue on BREZTRI 2 puffs Twice daily  , rinse after use.  Albuterol inhaler As needed   Activity as tolerated.  Work on not smoking  Call back if you change your mind on pulmonary rehab  Follow up with Dr. Delton Coombes in 6 weeks with Spirometry with DLCO . and As needed   Please contact office for sooner follow up if symptoms do not improve or worsen or seek emergency care

## 2022-12-07 NOTE — Patient Instructions (Addendum)
Chest xray today  Prednisone taper over next week.  Continue on BREZTRI 2 puffs Twice daily  , rinse after use.  Albuterol inhaler As needed   Activity as tolerated.  Work on not smoking  Call back if you change your mind on pulmonary rehab  Follow up with Dr. Delton Coombes in 6 weeks with Spirometry with DLCO . and As needed   Please contact office for sooner follow up if symptoms do not improve or worsen or seek emergency care

## 2022-12-11 ENCOUNTER — Other Ambulatory Visit: Payer: Self-pay | Admitting: *Deleted

## 2022-12-12 ENCOUNTER — Telehealth: Payer: Self-pay | Admitting: Family Medicine

## 2022-12-12 ENCOUNTER — Other Ambulatory Visit: Payer: Self-pay | Admitting: *Deleted

## 2022-12-12 NOTE — Telephone Encounter (Signed)
SPOKE WITH WIFE AND ADVISED PHOSPHORUS

## 2022-12-13 ENCOUNTER — Telehealth: Payer: Self-pay | Admitting: Family Medicine

## 2022-12-13 ENCOUNTER — Other Ambulatory Visit: Payer: Self-pay | Admitting: Family Medicine

## 2022-12-13 MED ORDER — PHOSPHORUS SUPPLEMENT 280-160-250 MG PO PACK: 1.0000 | PACK | Freq: Two times a day (BID) | ORAL | 1 refills | Status: DC

## 2022-12-13 NOTE — Telephone Encounter (Signed)
Please let the patient know that I sent their prescription to their pharmacy. Thanks, WS 

## 2022-12-13 NOTE — Telephone Encounter (Signed)
Wife says no where local has PHOSPHORUS. Wants to know if there is something else that pt can take that would be at a local store?

## 2022-12-14 ENCOUNTER — Other Ambulatory Visit: Payer: Self-pay | Admitting: Family Medicine

## 2022-12-14 NOTE — Telephone Encounter (Signed)
Patient said they can't afford this medication and wants to know if anything else can be called in.

## 2022-12-14 NOTE — Telephone Encounter (Signed)
Any over the counter version of phosphorous supplement is fine.

## 2022-12-14 NOTE — Telephone Encounter (Signed)
THIS MEDICATION IS NOT SOLD OTC IN STORES, ONLINE ONLY

## 2022-12-15 NOTE — Telephone Encounter (Signed)
It is okay for hi to order online. On [employer] I saw one from [facility] called Phos-NaK a poder in a packet. Take one BID

## 2022-12-18 ENCOUNTER — Ambulatory Visit: Payer: Medicare HMO | Attending: Cardiology | Admitting: *Deleted

## 2022-12-18 DIAGNOSIS — I4891 Unspecified atrial fibrillation: Secondary | ICD-10-CM | POA: Diagnosis not present

## 2022-12-18 DIAGNOSIS — Z5181 Encounter for therapeutic drug level monitoring: Secondary | ICD-10-CM

## 2022-12-18 LAB — POCT INR: INR: 2.3 (ref 2.0–3.0)

## 2022-12-18 NOTE — Patient Instructions (Signed)
Continue warfarin 1/2 tablet daily Recheck in 3 weeks.

## 2022-12-18 NOTE — Telephone Encounter (Signed)
CALLED WIFE, NO ANSWER, LEFT DETAILED VOICE MESSAGE

## 2023-01-02 ENCOUNTER — Other Ambulatory Visit: Payer: Medicare HMO

## 2023-01-03 LAB — PHOSPHORUS: Phosphorus: 3.5 mg/dL (ref 2.8–4.1)

## 2023-01-03 LAB — POTASSIUM: Potassium: 3.9 mmol/L (ref 3.5–5.2)

## 2023-01-03 NOTE — Progress Notes (Signed)
Hello [name],  Your lab result is normal and/or stable.Some minor variations that are not significant are commonly marked abnormal, but do not represent any medical problem for you.  Best regards, Kathleena Freeman, M.D.

## 2023-01-09 ENCOUNTER — Ambulatory Visit: Payer: Medicare HMO | Attending: Cardiology | Admitting: *Deleted

## 2023-01-09 DIAGNOSIS — Z5181 Encounter for therapeutic drug level monitoring: Secondary | ICD-10-CM | POA: Diagnosis not present

## 2023-01-09 DIAGNOSIS — I4891 Unspecified atrial fibrillation: Secondary | ICD-10-CM | POA: Diagnosis not present

## 2023-01-09 LAB — POCT INR: INR: 2.2 (ref 2.0–3.0)

## 2023-01-09 NOTE — Patient Instructions (Signed)
Continue warfarin 1/2 tablet daily Recheck in 4 weeks 

## 2023-01-15 ENCOUNTER — Ambulatory Visit: Payer: Medicare HMO | Attending: Nurse Practitioner | Admitting: Nurse Practitioner

## 2023-01-15 ENCOUNTER — Encounter: Payer: Self-pay | Admitting: Nurse Practitioner

## 2023-01-15 VITALS — BP 96/60 | HR 71 | Ht 66.0 in | Wt 148.8 lb

## 2023-01-15 DIAGNOSIS — Z720 Tobacco use: Secondary | ICD-10-CM | POA: Diagnosis not present

## 2023-01-15 DIAGNOSIS — R0609 Other forms of dyspnea: Secondary | ICD-10-CM | POA: Diagnosis not present

## 2023-01-15 DIAGNOSIS — I25119 Atherosclerotic heart disease of native coronary artery with unspecified angina pectoris: Secondary | ICD-10-CM

## 2023-01-15 DIAGNOSIS — I4891 Unspecified atrial fibrillation: Secondary | ICD-10-CM

## 2023-01-15 DIAGNOSIS — E785 Hyperlipidemia, unspecified: Secondary | ICD-10-CM | POA: Diagnosis not present

## 2023-01-15 DIAGNOSIS — J449 Chronic obstructive pulmonary disease, unspecified: Secondary | ICD-10-CM | POA: Diagnosis not present

## 2023-01-15 DIAGNOSIS — I10 Essential (primary) hypertension: Secondary | ICD-10-CM | POA: Diagnosis not present

## 2023-01-15 DIAGNOSIS — I5022 Chronic systolic (congestive) heart failure: Secondary | ICD-10-CM

## 2023-01-15 DIAGNOSIS — I959 Hypotension, unspecified: Secondary | ICD-10-CM

## 2023-01-15 NOTE — Progress Notes (Signed)
Office Visit    Patient Name: [name] Date of Encounter: 01/15/2023  PCP:  Mechele Claude, MD   [facility]  Cardiologist:  Dina Rich, MD  Advanced Practice Provider:  No care team member to display Electrophysiologist:  None   Chief Complaint    [name] is a 77 y.o. male with a hx of chronic HFrEF, leg edema, CAD, s/p MI in 46 and CABG, hypertension, hyperlipidemia, A-fib, and history of COPD, who presents today for scheduled follow-up.  Past Medical History    Past Medical History:  Diagnosis Date   Anginal pain (HCC)    Atrial fibrillation by electrocardiogram (HCC) 11/2019   Atrial flutter by electrocardiogram [facility]) 11/2019   Bladder cancer (HCC) 01/2020   Bladder tumor    BPH (benign prostatic hyperplasia)    CAD (coronary artery disease)    a. s/p INF MI 1997;  b. s/p CABG 2001;  c. LHC 11/2009:  3v CAD, S-PDA ok with 40% mid, S-OM ok, S-Dx ok, L-LAD ok, EF 50%;  d.  Lex MV 5/14:  Inferolateral scar, EF 46%, no ischemia   COPD (chronic obstructive pulmonary disease) (HCC)    Dyspnea    ETOH abuse    GERD (gastroesophageal reflux disease)    History of gout    Hypercholesterolemia    Hypertension    Myocardial infarction [facility]) 2013   Prostate cancer (HCC)    Tobacco abuse    Past Surgical History:  Procedure Laterality Date   ANTERIOR CERVICAL DECOMPRESSION/DISCECTOMY FUSION 4 LEVELS N/A 07/21/2016   Procedure: ANTERIOR CERVICAL DECOMPRESSION/DISCECTOMY FUSION CERVICAL TWO-THREE, CERVICAL THREE-FOUR,. CERVICAL FOUR-FIVE, CERVICAL  FIVE-SIX;  Surgeon: Coletta Memos, MD;  Location: [facility] OR;  Service: Neurosurgery;  Laterality: N/A;   BACK SURGERY     CARDIAC CATHETERIZATION  2011   CARDIOVERSION N/A 06/10/2020   Procedure: CARDIOVERSION;  Surgeon: Antoine Poche, MD;  Location[facility];  Service: Endoscopy;  Laterality: N/A;   COLONOSCOPY     in remote past, Dr. Jarold Motto. Obtaining records.    COLONOSCOPY N/A  11/21/2017   Surgeon: Corbin Ade, MD; Three 4-6 mm polyps in the rectum and splenic flexure resected and retrieved (tubular adenomas), nonbleeding internal hemorrhoids.  Repeat in 5 years.   COLONOSCOPY WITH PROPOFOL N/A 06/09/2021   Surgeon: Corbin Ade, MD;  Three 3-7 mm polyps in the ascending colon resected and retrieved, mild radiation proctitis, otherwise normal exam. Pathology revealed tubular adenomas.  Recommended repeat colonoscopy in 5 years.   CORONARY ANGIOPLASTY     CORONARY ARTERY BYPASS GRAFT     CORONARY STENT INTERVENTION N/A 03/27/2022   Procedure: CORONARY STENT INTERVENTION;  Surgeon: Swaziland, Peter M, MD;  Location: [facility] INVASIVE CV LAB;  Service: Cardiovascular;  Laterality: N/A;   ESOPHAGOGASTRODUODENOSCOPY  05/27/2012   ZOX:WRUEAV esophagus, stomach and duodenum s/p dilator   POLYPECTOMY  11/21/2017   Procedure: POLYPECTOMY;  Surgeon: Corbin Ade, MD;  Location: [facility];  Service: Endoscopy;;  colon   POLYPECTOMY  06/09/2021   Procedure: POLYPECTOMY;  Surgeon: Corbin Ade, MD;  Location: [facility];  Service: Endoscopy;;   PROSTATE SURGERY     RIGHT/LEFT HEART CATH AND CORONARY/GRAFT ANGIOGRAPHY N/A 03/27/2022   Procedure: RIGHT/LEFT HEART CATH AND CORONARY/GRAFT ANGIOGRAPHY;  Surgeon: Swaziland, Peter M, MD;  Location: [facility] INVASIVE CV LAB;  Service: Cardiovascular;  Laterality: N/A;   TRANSURETHRAL RESECTION OF BLADDER TUMOR WITH MITOMYCIN-C Left 01/06/2020   Procedure: CYSTOSCOPY TRANSURETHRAL  RESECTION OF BLADDER TUMOR,LEFT URETERSCOPY AND GEMCITABINE;  Surgeon: Bjorn Pippin, MD;  Location: [facility] ORS;  Service: Urology;  Laterality: Left;    Allergies  No Known Allergies  History of Present Illness    [name] is a 77 y.o. male with a PMH as mentioned above.  Previous cardiovascular history of prior MI in 42, history of CABG.  Cardiac catheterization in 2011 showed patent grafts.  Myoview in 2014 revealed old inferolateral scar, no  ischemia.  Hospital admission in 2023 with atypical chest pain, given his recent drop in EF, cardiac catheterization was pursued.  Report noted below, received PCI to SVG-OM1.  Last seen by Dr. Dina Rich on October 13, 2022.  Dr. Wyline Mood stated he was on max tolerated GDMT.  Advised not to try Entresto given his blood pressures that were running soft and his history of hyperkalemia.  Stated Plavix could be discontinued in August after his last recent stent.  Patient noted some recent cough/wheezing/shortness of breath, was given 5-day course of prednisone at 40 mg daily, was told to follow-up with pulmonary.  Today he presents for 101-month follow-up with his wife. Admits to chronic intermittent shortness of breath with exertion, admits to aching sensation in his mid chest with this, says "feels like my lungs are smothering my heart." Says taking his albuterol inhaler helps and wife says he has been using that more frequently, seeing his pulmonologist this Friday. Smokes 1/2 PPD. Wife notes an episode a little over a week ago where he had a ? Passing out episode or fell asleep, pt couldn't remember, wife checked BP after event and was noted to be 84/48, denies any recurrent episodes. Denies any palpitations, syncope, presyncope, dizziness, orthopnea, PND, swelling or significant weight changes, acute bleeding, or claudication.  EKGs/Labs/Other Studies Reviewed:   The following studies were reviewed today:   EKG:  EKG is not ordered today.    Echo 06/2022:  1. Left ventricular ejection fraction, by estimation, is 45 to 50%. The  left ventricle has mildly decreased function. The left ventricle  demonstrates regional wall motion abnormalities (see scoring  diagram/findings for description). There is mild  asymmetric left ventricular hypertrophy of the basal segment. Left  ventricular diastolic parameters are indeterminate.   2. Right ventricular systolic function is normal. The right ventricular   size is normal. There is normal pulmonary artery systolic pressure. The  estimated right ventricular systolic pressure is 24.3 mmHg.   3. Left atrial size was mild to moderately dilated.   4. The mitral valve is grossly normal. Mild to moderate mitral valve  regurgitation.   5. The aortic valve is tricuspid. Aortic valve regurgitation is not  visualized.   6. The inferior vena cava is normal in size with greater than 50%  respiratory variability, suggesting right atrial pressure of 3 mmHg.   Comparison(s): Prior images reviewed side by side. LVEF improved somewhat  at 45-50% with wall motion abnormalities consistent with ischemic  cardiomyopathy.  Recent Labs: 11/30/2022: ALT 23; BUN 9; Creatinine, Ser 1.10; Hemoglobin 13.7; Magnesium 1.7; Platelets 235; Sodium 142 01/02/2023: Potassium 3.9  Recent Lipid Panel    Component Value Date/Time   CHOL 141 11/30/2022 1352   TRIG 143 11/30/2022 1352   HDL 50 11/30/2022 1352   CHOLHDL 2.8 11/30/2022 1352   CHOLHDL 2.7 03/26/2022 0514   VLDL 22 03/26/2022 0514   LDLCALC 66 11/30/2022 1352    Risk Assessment/Calculations:   CHA2DS2-VASc Score = 4  This indicates a 4.8%  annual risk of stroke. The patient's score is based upon: CHF History: 1 HTN History: 1 Diabetes History: 0 Stroke History: 0 Vascular Disease History: 1 Age Score: 1 Gender Score: 0   Home Medications   Current Meds  Medication Sig   acetaminophen (TYLENOL) 500 MG tablet Take 2 tablets (1,000 mg total) by mouth every 6 (six) hours as needed for moderate pain or headache.   albuterol (VENTOLIN HFA) 108 (90 Base) MCG/ACT inhaler Inhale 2 puffs into the lungs every 6 (six) hours as needed for wheezing or shortness of breath.   allopurinol (ZYLOPRIM) 100 MG tablet Take 1 tablet (100 mg total) by mouth daily.   atorvastatin (LIPITOR) 80 MG tablet TAKE 1 TABLET EVERY DAY AT 6 PM   B Complex Vitamins (VITAMIN B COMPLEX PO) Take 1 tablet by mouth daily.    Budeson-Glycopyrrol-Formoterol (BREZTRI AEROSPHERE) 160-9-4.8 MCG/ACT AERO Take 2 puffs by mouth in the morning and at bedtime. Dispense 3 inhalers   Cholecalciferol (VITAMIN D3) 125 MCG (5000 UT) CAPS Take 5,000 Units by mouth daily.   clopidogrel (PLAVIX) 75 MG tablet Take 1 tablet (75 mg total) by mouth daily. Continue this for 1 year   empagliflozin (JARDIANCE) 10 MG TABS tablet Take 1 tablet (10 mg total) by mouth daily before breakfast.   fluticasone (FLONASE) 50 MCG/ACT nasal spray Place 2 sprays into both nostrils daily as needed for allergies.   furosemide (LASIX) 20 MG tablet Take 1 tablet (20 mg total) by mouth as needed (swelling).   losartan (COZAAR) 25 MG tablet Take 1 tablet (25 mg total) by mouth daily.   metoprolol succinate (TOPROL XL) 50 MG 24 hr tablet Take 1.5 tablets (75 mg total) by mouth daily.   nitroGLYCERIN (NITROSTAT) 0.4 MG SL tablet Place 1 tablet (0.4 mg total) under the tongue every 5 (five) minutes as needed for chest pain.   pantoprazole (PROTONIX) 40 MG tablet Take 1 tablet (40 mg total) by mouth daily.   Potassium & Sodium Phosphates (PHOSPHORUS SUPPLEMENT) 280-160-250 MG PACK Take 1 packet by mouth 2 (two) times daily. In water or juice   spironolactone (ALDACTONE) 25 MG tablet Take 0.5 tablets (12.5 mg total) by mouth daily.   warfarin (COUMADIN) 5 MG tablet Take 5 mg by mouth daily. Take 1/2 - to 1 tablet daily or as directed by coumadin clinic.   amLODipine (NORVASC) 5 MG tablet Take 1 tablet (5 mg total) by mouth daily.     Review of Systems    All other systems reviewed and are otherwise negative except as noted above.  Physical Exam    VS:  BP 96/60   Pulse 71   Ht 5\' 6"  (1.676 m)   Wt 148 lb 12.8 oz (67.5 kg)   SpO2 97%   BMI 24.02 kg/m  , BMI Body mass index is 24.02 kg/m.  Wt Readings from Last 3 Encounters:  01/15/23 148 lb 12.8 oz (67.5 kg)  12/07/22 147 lb (66.7 kg)  11/30/22 147 lb 6.4 oz (66.9 kg)     GEN: Well nourished, well  developed, in no acute distress. HEENT: normal. Neck: Supple, no JVD, carotid bruits, or masses. Cardiac: S1/S2, RRR, no murmurs, rubs, or gallops. No clubbing, cyanosis, edema.  Radials/PT 2+ and equal bilaterally.  Respiratory:  Respirations regular and unlabored, clear to auscultation bilaterally. MS: No deformity or atrophy. Skin: Warm and dry, no rash. Neuro:  Strength and sensation are intact. Psych: Normal affect.  Assessment & Plan    Chronic  HFmrEF Stage C, NYHA class II-III symptoms. Echo 06/2022 revealed EF 45-50%. Weight stable. Euvolemic and well compensated on exam. Will stop amlodipine - see below. Continue Jardiance, Lasix, Losartan, Metoprolol succinate, Aldactone, and potassium supplement. Low sodium diet, fluid restriction <2L, and daily weights encouraged. Educated to contact our office for weight gain of 2 lbs overnight or 5 lbs in one week.  CAD, s/p MI in 11 and CABG Chest pain does not sound cardiac in etiology and sounds r/t COPD, see below. Pt also agrees that his CP was different when he received his stent. No indication for ischemic evaluation at this time. Not on ASA d/t being on Coumadin. Continue current medication regimen. Heart healthy diet encouraged. ED precautions discussed.   HTN, with episodes of hypotension Episodic low BP and BP 96/60 today. Will stop Amlodipine. No other medication changes at this time. Given BP log and discussed to monitor BP at home at least 2 hours after medications and sitting for 5-10 minutes. Heart healthy diet encouraged. If no improvement by next OV, plan to reduce Toprol XL.   HLD Continue atorvastatin. Heart healthy diet encouraged.   A-fib  Denies any tachycardia or palpitations. Continue Toprol XL. Continue to follow-up at [facility], denies any bleeding issues on coumadin. Heart healthy diet encouraged.   COPD, DOE Admits to shortness of breath, sounds as though pt's symptoms are not well managed on maintenance  medications, having to use rescue inhaler more often. No medication changes at this time. Follow-up with pulmonology as scheduled.   Tobacco abuse Smoking cessation encouraged and discussed.   Disposition: Follow up in 8 week(s) with Dina Rich, MD or APP.  Signed, Sharlene Dory, NP 01/15/2023, 2:27 PM [facility]

## 2023-01-15 NOTE — Patient Instructions (Signed)
Medication Instructions:   Stop Amlodipine (Norvasc) Continue all other medications.     Labwork:  none  Testing/Procedures:  none  Follow-Up:  8 weeks   Any Other Special Instructions Will Be Listed Below (If Applicable).  BP log Smoking cessation   If you need a refill on your cardiac medications before your next appointment, please call your pharmacy.

## 2023-01-19 ENCOUNTER — Encounter: Payer: Self-pay | Admitting: Emergency Medicine

## 2023-01-19 ENCOUNTER — Ambulatory Visit (INDEPENDENT_AMBULATORY_CARE_PROVIDER_SITE_OTHER): Payer: Medicare HMO | Admitting: Emergency Medicine

## 2023-01-19 ENCOUNTER — Ambulatory Visit: Payer: Medicare HMO | Admitting: Emergency Medicine

## 2023-01-19 VITALS — BP 130/74 | HR 72 | Temp 98.1°F

## 2023-01-19 DIAGNOSIS — F17200 Nicotine dependence, unspecified, uncomplicated: Secondary | ICD-10-CM

## 2023-01-19 DIAGNOSIS — J449 Chronic obstructive pulmonary disease, unspecified: Secondary | ICD-10-CM

## 2023-01-19 LAB — PULMONARY FUNCTION TEST
DL/VA % pred: 60 %
DL/VA: 2.44 ml/min/mmHg/L
DLCO cor % pred: 57 %
DLCO cor: 13.24 ml/min/mmHg
DLCO unc % pred: 55 %
DLCO unc: 12.89 ml/min/mmHg
FEF 25-75 Pre: 0.87 L/sec
FEF2575-%Pred-Pre: 44 %
FEV1-%Pred-Pre: 64 %
FEV1-Pre: 1.74 L
FEV1FVC-%Pred-Pre: 90 %
FEV6-%Pred-Pre: 73 %
FEV6-Pre: 2.59 L
FEV6FVC-%Pred-Pre: 104 %
FVC-%Pred-Pre: 70 %
FVC-Pre: 2.66 L
Pre FEV1/FVC ratio: 66 %
Pre FEV6/FVC Ratio: 97 %

## 2023-01-19 NOTE — Progress Notes (Signed)
Spiro/DLCO performed today.  

## 2023-01-19 NOTE — Patient Instructions (Signed)
We reviewed your pulmonary function testing today.  There has been progression of your COPD compared with October 2022.   Your walking oxygen test on 12/07/2022 showed that at this time you do not need to use oxygen when you are exerting yourself. Please continue Breztri 2 puffs twice a day.  Rinse and gargle after using. Keep your albuterol available to use 2 puffs when needed for shortness of breath, chest tightness, wheezing. You need to work hard on decreasing your cigarettes.  Ultimate goal will be to stop altogether.  This is significantly impacting the progression of your shortness of breath. You will be due for your repeat screening CT scan of the chest in September 2024 Follow Dr. Delton Coombes in 6 months, sooner if you have any problems.

## 2023-01-19 NOTE — Patient Instructions (Signed)
Spiro/DLCO performed today.  

## 2023-01-19 NOTE — Assessment & Plan Note (Signed)
You need to work hard on decreasing your cigarettes.  Ultimate goal will be to stop altogether.  This is significantly impacting the progression of your shortness of breath. You will be due for your repeat screening CT scan of the chest in September 2024

## 2023-01-19 NOTE — Assessment & Plan Note (Addendum)
Progressive shortness of breath and decreased functional capacity over several months time.  He has coexisting CAD, had stenting to an SVG and 03/2022 that gave him some temporary relief and dyspnea.  Certainly possible that his coronary disease is impacting his functional capacity but I think it is more likely that this is progression of his obstructive lung disease.  His pulmonary function testing today confirms that his COPD is worse.  Unfortunately he is on maximal medical therapy, no real indication to change his Breztri.  I could consider adding scheduled prednisone at some point going forward.  We reviewed your pulmonary function testing today.  There has been progression of your COPD compared with October 2022.   Your walking oxygen test on 12/07/2022 showed that at this time you do not need to use oxygen when you are exerting yourself. Please continue Breztri 2 puffs twice a day.  Rinse and gargle after using. Keep your albuterol available to use 2 puffs when needed for shortness of breath, chest tightness, wheezing. Follow Dr. Delton Coombes in 6 months, sooner if you have any problems.

## 2023-01-19 NOTE — Progress Notes (Signed)
   Subjective:    [name], male    DOB: 05-12-1946, 77 y.o.   MRN: [id number]  HPI  ROV 01/19/2023 --[name] is 77, has a history of tobacco use (61 pack years), COPD with associated emphysema, hyperinflation and mild obstruction on pulmonary function testing.  PMH: CAD/CABG, hypertension, A-fib/flutter, bladder cancer and prostate cancer.  He has been experiencing progressive exertional shortness of breath.  Had PTCI to a blocked SVG from his CABG in 03/2022.  Currently managed on Breztri, uses albuterol about 2-3x a day. He is unsure whether Markus Daft is helping him.  He reports SOB, believes that this is mainly w exertion but his wife notes some at rest. He has cough especially in the am, less so during the day. He hears some wheeze at night Walking oximetry at his last visit 12/07/2022 did not show any desaturations on room air. Due for repeat lung cancer screening CT chest in September 2024.  Pulmonary function testing performed today and reviewed by me shows moderately severe obstruction.  Bronchodilator responsiveness was not tested.  Diffusion capacity is decreased.  FEV1 1.74 L (64%) down from 2.11 L (79% predicted).    Review of Systems As per HPI     Objective:   Physical Exam  Vitals:   01/19/23 1525  BP: 130/74  Pulse: 72  Temp: 98.1 F (36.7 C)  TempSrc: Oral  SpO2: 96%   Gen: Pleasant, well-nourished, in no distress,  normal affect  ENT: No lesions,  mouth clear,  oropharynx clear, no postnasal drip  Neck: No JVD, no stridor  Lungs: No use of accessory muscles, no crackles or wheezing on normal respiration, no wheeze on forced expiration  Cardiovascular: RRR, heart sounds normal, no murmur or gallops, no peripheral edema  Musculoskeletal: No deformities, no cyanosis or clubbing  Neuro: alert, awake, non focal  Skin: Warm, no lesions or rash     Assessment & Plan:  COPD (chronic obstructive pulmonary disease) (HCC) Progressive shortness of  breath and decreased functional capacity over several months time.  He has coexisting CAD, had stenting to an SVG and 03/2022 that gave him some temporary relief and dyspnea.  Certainly possible that his coronary disease is impacting his functional capacity but I think it is more likely that this is progression of his obstructive lung disease.  His pulmonary function testing today confirms that his COPD is worse.  Unfortunately he is on maximal medical therapy, no real indication to change his Breztri.  I could consider adding scheduled prednisone at some point going forward.  We reviewed your pulmonary function testing today.  There has been progression of your COPD compared with October 2022.   Your walking oxygen test on 12/07/2022 showed that at this time you do not need to use oxygen when you are exerting yourself. Please continue Breztri 2 puffs twice a day.  Rinse and gargle after using. Keep your albuterol available to use 2 puffs when needed for shortness of breath, chest tightness, wheezing. Follow Dr. Delton Coombes in 6 months, sooner if you have any problems.  TOBACCO USER You need to work hard on decreasing your cigarettes.  Ultimate goal will be to stop altogether.  This is significantly impacting the progression of your shortness of breath. You will be due for your repeat screening CT scan of the chest in September 2024   [name], MD, PhD 01/19/2023, 3:52 PM [facility] [phone] or if no answer [phone]

## 2023-02-05 ENCOUNTER — Other Ambulatory Visit: Payer: Self-pay | Admitting: Family Medicine

## 2023-02-05 DIAGNOSIS — K219 Gastro-esophageal reflux disease without esophagitis: Secondary | ICD-10-CM

## 2023-02-06 ENCOUNTER — Ambulatory Visit: Payer: Medicare HMO | Attending: Cardiology | Admitting: *Deleted

## 2023-02-06 DIAGNOSIS — Z5181 Encounter for therapeutic drug level monitoring: Secondary | ICD-10-CM | POA: Diagnosis not present

## 2023-02-06 DIAGNOSIS — I4891 Unspecified atrial fibrillation: Secondary | ICD-10-CM

## 2023-02-06 LAB — POCT INR: INR: 1.6 — AB (ref 2.0–3.0)

## 2023-02-06 NOTE — Patient Instructions (Signed)
Take 1 tablet tonight and tomorrow night then resume 1/2 tablet daily Recheck in 3 weeks 

## 2023-02-27 ENCOUNTER — Ambulatory Visit: Payer: Medicare HMO | Attending: Cardiology | Admitting: *Deleted

## 2023-02-27 DIAGNOSIS — I4891 Unspecified atrial fibrillation: Secondary | ICD-10-CM

## 2023-02-27 DIAGNOSIS — Z5181 Encounter for therapeutic drug level monitoring: Secondary | ICD-10-CM

## 2023-02-27 LAB — POCT INR: INR: 1.9 — AB (ref 2.0–3.0)

## 2023-02-27 NOTE — Patient Instructions (Signed)
Increase dose to 1/2 tablet daily except 1 tablet on Tuesdays Recheck in 4 weeks.

## 2023-03-20 ENCOUNTER — Ambulatory Visit: Payer: Medicare HMO | Attending: Nurse Practitioner | Admitting: Nurse Practitioner

## 2023-03-20 ENCOUNTER — Encounter: Payer: Self-pay | Admitting: Nurse Practitioner

## 2023-03-20 VITALS — BP 140/80 | HR 74 | Ht 66.0 in | Wt 149.6 lb

## 2023-03-20 DIAGNOSIS — I4891 Unspecified atrial fibrillation: Secondary | ICD-10-CM | POA: Diagnosis not present

## 2023-03-20 DIAGNOSIS — Z79899 Other long term (current) drug therapy: Secondary | ICD-10-CM | POA: Diagnosis not present

## 2023-03-20 DIAGNOSIS — E785 Hyperlipidemia, unspecified: Secondary | ICD-10-CM

## 2023-03-20 DIAGNOSIS — I5022 Chronic systolic (congestive) heart failure: Secondary | ICD-10-CM

## 2023-03-20 DIAGNOSIS — Z720 Tobacco use: Secondary | ICD-10-CM | POA: Diagnosis not present

## 2023-03-20 DIAGNOSIS — I10 Essential (primary) hypertension: Secondary | ICD-10-CM

## 2023-03-20 DIAGNOSIS — I2510 Atherosclerotic heart disease of native coronary artery without angina pectoris: Secondary | ICD-10-CM

## 2023-03-20 DIAGNOSIS — R0609 Other forms of dyspnea: Secondary | ICD-10-CM

## 2023-03-20 DIAGNOSIS — J449 Chronic obstructive pulmonary disease, unspecified: Secondary | ICD-10-CM

## 2023-03-20 MED ORDER — FUROSEMIDE 20 MG PO TABS: 20.0000 mg | ORAL_TABLET | ORAL | 0 refills | Status: DC | PRN

## 2023-03-20 MED ORDER — EMPAGLIFLOZIN 10 MG PO TABS: 10.0000 mg | ORAL_TABLET | Freq: Every day | ORAL | 1 refills | Status: DC

## 2023-03-20 MED ORDER — LOSARTAN POTASSIUM 25 MG PO TABS: 25.0000 mg | ORAL_TABLET | Freq: Every day | ORAL | 1 refills | Status: DC

## 2023-03-20 MED ORDER — CLOPIDOGREL BISULFATE 75 MG PO TABS: 75.0000 mg | ORAL_TABLET | Freq: Every day | ORAL | 1 refills | Status: DC

## 2023-03-20 MED ORDER — METOPROLOL SUCCINATE ER 50 MG PO TB24: 75.0000 mg | ORAL_TABLET | Freq: Every day | ORAL | 1 refills | Status: DC

## 2023-03-20 MED ORDER — ATORVASTATIN CALCIUM 80 MG PO TABS: 80.0000 mg | ORAL_TABLET | Freq: Every day | ORAL | 1 refills | Status: DC

## 2023-03-20 MED ORDER — SPIRONOLACTONE 25 MG PO TABS: 12.5000 mg | ORAL_TABLET | Freq: Two times a day (BID) | ORAL | 1 refills | Status: DC

## 2023-03-20 NOTE — Progress Notes (Unsigned)
Office Visit    Patient Name: [name] Date of Encounter: 03/20/2023 PCP:  Mechele Claude, MD [facility]  Cardiologist:  Dina Rich, MD  Advanced Practice Provider:  No care team member to display Electrophysiologist:  None   Chief Complaint and HPI    [name] is a 77 y.o. male with a hx of chronic HFrEF, leg edema, CAD, s/p MI in 35 and CABG, hypertension, hyperlipidemia, A-fib, and history of COPD, who presents today for scheduled follow-up.  Previous cardiovascular history of prior MI in 13, history of CABG.  Cardiac catheterization in 2011 showed patent grafts.  Myoview in 2014 revealed old inferolateral scar, no ischemia.  Hospital admission in 2023 with atypical chest pain, given his recent drop in EF, cardiac catheterization was pursued.  Report noted below, received PCI to SVG-OM1.  Last seen by Dr. Dina Rich on October 13, 2022.  Dr. Wyline Mood stated he was on max tolerated GDMT.  Advised not to try Entresto given his blood pressures that were running soft and his history of hyperkalemia.  Stated Plavix could be discontinued in August after his last recent stent.  Patient noted some recent cough/wheezing/shortness of breath, was given 5-day course of prednisone at 40 mg daily, was told to follow-up with pulmonary.  Today he presents for follow-up with his wife. Continues to admit to chronic intermittent shortness of breath with exertion, wife says his pulmonologist says his COPD has progressed, may require . Patient denies any chest pain, palpitations, syncope, presyncope, dizziness, orthopnea, PND, swelling or significant weight changes, acute bleeding, or claudication. BP and HR log show overall stable readings.   EKGs/Labs/Other Studies Reviewed:   The following studies were reviewed today:  EKG:   EKG Interpretation Date/Time:  Tuesday March 20 2023 13:09:32 EDT Ventricular Rate:  69 PR Interval:    QRS Duration:  112 QT  Interval:  418 QTC Calculation: 447 R Axis:   -29  Text Interpretation: Atrial fibrillation Low voltage QRS Cannot rule out Anterior infarct , age undetermined ST & T wave abnormality, consider inferolateral ischemia When compared with ECG of 28-Mar-2022 06:43, Minimal criteria for Anterior infarct are now Present Inverted T waves have replaced nonspecific T wave abnormality in Inferior leads Confirmed by Sharlene Dory [phone]) on 03/21/2023 8:43:21 PM   Echo 06/2022:  1. Left ventricular ejection fraction, by estimation, is 45 to 50%. The  left ventricle has mildly decreased function. The left ventricle  demonstrates regional wall motion abnormalities (see scoring  diagram/findings for description). There is mild  asymmetric left ventricular hypertrophy of the basal segment. Left  ventricular diastolic parameters are indeterminate.   2. Right ventricular systolic function is normal. The right ventricular  size is normal. There is normal pulmonary artery systolic pressure. The  estimated right ventricular systolic pressure is 24.3 mmHg.   3. Left atrial size was mild to moderately dilated.   4. The mitral valve is grossly normal. Mild to moderate mitral valve  regurgitation.   5. The aortic valve is tricuspid. Aortic valve regurgitation is not  visualized.   6. The inferior vena cava is normal in size with greater than 50%  respiratory variability, suggesting right atrial pressure of 3 mmHg.   Comparison(s): Prior images reviewed side by side. LVEF improved somewhat  at 45-50% with wall motion abnormalities consistent with ischemic  cardiomyopathy.  R/Left heart cath 03/2022:   Ost LAD to Prox LAD lesion is 100% stenosed.   Mid LAD lesion  is 100% stenosed.   Prox Cx lesion is 100% stenosed.   Prox RCA to Mid RCA lesion is 80% stenosed.   Mid RCA lesion is 100% stenosed.   Origin to Prox Graft lesion is 80% stenosed.   Origin to Prox Graft lesion is 100% stenosed.   A drug-eluting  stent was successfully placed using a SYNERGY XD 4.0X12.   Post intervention, there is a 0% residual stenosis.   LIMA graft was visualized by angiography and is normal in caliber.   SVG graft was visualized by angiography and is normal in caliber.   SVG graft was visualized by angiography and is normal in caliber.   SVG graft was visualized by angiography.   The graft exhibits no disease.   The graft exhibits no disease.   LV end diastolic pressure is mildly elevated.   Hemodynamic findings consistent with mild pulmonary hypertension.   3 vessel occlusive CAD Patent LIMA to the LAD Patent SVG to the first diagonal Patent SVG to OM1 with 80% focal stenosis in the proximal SVG Occluded SVG to RCA. The RCA fills by left to right collaterals.  Mildly elevated LV filling pressures.  Mild pulmonary HTN.  Normal cardiac output 4.51 L/min with index 2.53.  Successful PCI of the SVG to OM1 with DES   Plan: DAPT with ASA for one month and Plavix for 12 months. Can resume oral anticoagulation in am if no bleeding noted.   Risk Assessment/Calculations:   CHA2DS2-VASc Score = 4  This indicates a 4.8% annual risk of stroke. The patient's score is based upon: CHF History: 1 HTN History: 1 Diabetes History: 0 Stroke History: 0 Vascular Disease History: 1 Age Score: 1 Gender Score: 0   Review of Systems    All other systems reviewed and are otherwise negative except as noted above.  Physical Exam    VS:  BP (!) 140/80   Pulse 74   Ht 5\' 6"  (1.676 m)   Wt 149 lb 9.6 oz (67.9 kg)   SpO2 98%   BMI 24.15 kg/m  , BMI Body mass index is 24.15 kg/m.  Wt Readings from Last 3 Encounters:  03/20/23 149 lb 9.6 oz (67.9 kg)  01/15/23 148 lb 12.8 oz (67.5 kg)  12/07/22 147 lb (66.7 kg)     GEN: Well nourished, well developed, in no acute distress. HEENT: normal. Neck: Supple, no JVD, carotid bruits, or masses. Cardiac: S1/S2, RRR, no murmurs, rubs, or gallops. No clubbing, cyanosis,  edema.  Radials/PT 2+ and equal bilaterally.  Respiratory:  Respirations regular and unlabored, clear to auscultation bilaterally. MS: No deformity or atrophy. Skin: Warm and dry, no rash. Neuro:  Strength and sensation are intact. Psych: Normal affect.  Assessment & Plan    Chronic HFmrEF, DOE, medication management Stage C, NYHA class II-III symptoms. Echo 06/2022 revealed EF 45-50%. Weight stable. Euvolemic and well compensated on exam. Continue Jardiance, Lasix, Losartan, Metoprolol succinate, and potassium supplement. Will increase Aldactone to 0.5 mg BID. Will obtain BMET, BNP and Mag to be drawn in 1 week. Low sodium diet, fluid restriction <2L, and daily weights encouraged. Educated to contact our office for weight gain of 2 lbs overnight or 5 lbs in one week.  CAD, s/p MI in 41 and CABG Stable with no anginal symptoms. No indication for ischemic evaluation.  Not on ASA d/t being on Coumadin. Continue current medication regimen. Heart healthy diet encouraged. ED precautions discussed. Plan to d/c Plavix at next OV.  HTN BP  overall stable. Given BP log and discussed to monitor BP at home at least 2 hours after medications and sitting for 5-10 minutes. Instructed to take Aldactone 0.5 tablet BID. Heart healthy diet encouraged.   HLD Continue atorvastatin. Heart healthy diet encouraged.   A-fib  Denies any tachycardia or palpitations. Continue Toprol XL. Continue to [facility], denies any bleeding issues on coumadin. Heart healthy diet encouraged.   COPD, DOE Admits to shortness of breath, and progressive DOE d/t patient's COPD. Increasing Aldactone as mentioned above. No other medication changes at this time. Follow-up with Dr. Delton Coombes as scheduled.   Tobacco abuse Smoking cessation encouraged and discussed.   Disposition: Will refill cardiac medications per patient's request. Follow up in 8 week(s) with Dina Rich, MD or APP.  Signed, Sharlene Dory,  NP 03/21/2023, 8:46 PM [facility]

## 2023-03-20 NOTE — Patient Instructions (Addendum)
Medication Instructions:   Increase Spironolactone to 1/2 tab twice a day   Metoprolol, Plavix, Losartan, Furosemide & Atorvastatin refilled today  Continue all other medications.     Labwork:  BMET, BNP, Mg - orders given  Please do in 1 week  Office will contact with results via phone, letter or mychart.     Testing/Procedures:  none  Follow-Up:  2 months   Any Other Special Instructions Will Be Listed Below (If Applicable).   If you need a refill on your cardiac medications before your next appointment, please call your pharmacy.

## 2023-03-26 ENCOUNTER — Telehealth: Payer: Self-pay | Admitting: Cardiology

## 2023-03-26 ENCOUNTER — Other Ambulatory Visit: Payer: Medicare HMO

## 2023-03-26 DIAGNOSIS — Z79899 Other long term (current) drug therapy: Secondary | ICD-10-CM | POA: Diagnosis not present

## 2023-03-26 DIAGNOSIS — R0609 Other forms of dyspnea: Secondary | ICD-10-CM | POA: Diagnosis not present

## 2023-03-26 NOTE — Telephone Encounter (Signed)
Pt c/o medication issue:  1. Name of Medication:  furosemide (LASIX) 20 MG tablet   2. How are you currently taking this medication (dosage and times per day)?   3. Are you having a reaction (difficulty breathing--STAT)?   4. What is your medication issue?   Judeth Cornfield with [facility] is requesting clarification on medication instructions.

## 2023-03-26 NOTE — Telephone Encounter (Signed)
Spoke with Qui Pharmacist at [facility]-clarification needed on lasix refill take daily as needed for swelling

## 2023-03-27 ENCOUNTER — Ambulatory Visit: Payer: Medicare HMO | Attending: Cardiology | Admitting: *Deleted

## 2023-03-27 DIAGNOSIS — Z5181 Encounter for therapeutic drug level monitoring: Secondary | ICD-10-CM | POA: Diagnosis not present

## 2023-03-27 DIAGNOSIS — I4891 Unspecified atrial fibrillation: Secondary | ICD-10-CM | POA: Diagnosis not present

## 2023-03-27 LAB — POCT INR: INR: 1.7 — AB (ref 2.0–3.0)

## 2023-03-27 LAB — BASIC METABOLIC PANEL
BUN/Creatinine Ratio: 8 — ABNORMAL LOW (ref 10–24)
BUN: 9 mg/dL (ref 8–27)
Potassium: 3.7 mmol/L (ref 3.5–5.2)
Sodium: 141 mmol/L (ref 134–144)

## 2023-03-27 NOTE — Patient Instructions (Signed)
Increase dose to 1/2 tablet daily except 1 tablet on Tuesdays, Thursdays and Saturdays Recheck in 2 weeks.

## 2023-03-29 ENCOUNTER — Telehealth: Payer: Self-pay

## 2023-03-29 MED ORDER — MAGNESIUM OXIDE 400 MG PO CAPS: 1.0000 | ORAL_CAPSULE | Freq: Every day | ORAL | 1 refills | Status: DC

## 2023-03-29 NOTE — Telephone Encounter (Signed)
Spoke with wife(Judy)DPR. Verbalized understanding and sent copy to PCP

## 2023-03-29 NOTE — Telephone Encounter (Signed)
-----   Message from Sharlene Dory sent at 03/28/2023  5:01 PM EDT ----- Labs reviewed. Overall unremarkable except for magnesium level. Please instruct patient to take Magnesium Oxide 400 mg daily and please repeat Magnesium level in 1 week for diagnosis of hypomagnesemia.   Thanks!   Sharlene Dory, AGNP-C

## 2023-04-06 ENCOUNTER — Other Ambulatory Visit (INDEPENDENT_AMBULATORY_CARE_PROVIDER_SITE_OTHER): Payer: Medicare HMO

## 2023-04-06 DIAGNOSIS — I4891 Unspecified atrial fibrillation: Secondary | ICD-10-CM

## 2023-04-06 DIAGNOSIS — Z5181 Encounter for therapeutic drug level monitoring: Secondary | ICD-10-CM

## 2023-04-06 DIAGNOSIS — Z79899 Other long term (current) drug therapy: Secondary | ICD-10-CM

## 2023-04-07 LAB — MAGNESIUM: Magnesium: 1.6 mg/dL (ref 1.6–2.3)

## 2023-04-10 MED ORDER — MAGNESIUM OXIDE 400 MG PO CAPS: 1.0000 | ORAL_CAPSULE | Freq: Two times a day (BID) | ORAL | 1 refills | Status: DC

## 2023-04-12 ENCOUNTER — Ambulatory Visit: Payer: Medicare HMO | Attending: Cardiology | Admitting: *Deleted

## 2023-04-12 DIAGNOSIS — I4891 Unspecified atrial fibrillation: Secondary | ICD-10-CM

## 2023-04-12 DIAGNOSIS — Z5181 Encounter for therapeutic drug level monitoring: Secondary | ICD-10-CM | POA: Diagnosis not present

## 2023-04-12 LAB — POCT INR: INR: 4.1 — AB (ref 2.0–3.0)

## 2023-04-12 NOTE — Patient Instructions (Signed)
Hold warfarin tonight then decrease dose to 1/2 tablet daily except 1 tablet on Tuesdays and Saturdays Recheck in 2 weeks.

## 2023-04-13 ENCOUNTER — Ambulatory Visit (HOSPITAL_COMMUNITY)
Admission: RE | Admit: 2023-04-13 | Discharge: 2023-04-13 | Disposition: A | Discharge status: Home / Self Care | Payer: Medicare HMO | Source: Ambulatory Visit | Attending: Acute Care | Admitting: Acute Care

## 2023-04-13 DIAGNOSIS — Z122 Encounter for screening for malignant neoplasm of respiratory organs: Secondary | ICD-10-CM | POA: Insufficient documentation

## 2023-04-13 DIAGNOSIS — F17210 Nicotine dependence, cigarettes, uncomplicated: Secondary | ICD-10-CM | POA: Diagnosis not present

## 2023-04-13 DIAGNOSIS — Z87891 Personal history of nicotine dependence: Secondary | ICD-10-CM | POA: Insufficient documentation

## 2023-04-19 ENCOUNTER — Other Ambulatory Visit: Payer: Self-pay | Admitting: Family Medicine

## 2023-04-19 DIAGNOSIS — Z8739 Personal history of other diseases of the musculoskeletal system and connective tissue: Secondary | ICD-10-CM

## 2023-04-23 ENCOUNTER — Other Ambulatory Visit: Payer: Medicare HMO

## 2023-04-23 DIAGNOSIS — Z79899 Other long term (current) drug therapy: Secondary | ICD-10-CM | POA: Diagnosis not present

## 2023-04-24 ENCOUNTER — Other Ambulatory Visit: Payer: Self-pay | Admitting: Acute Care

## 2023-04-24 ENCOUNTER — Encounter: Payer: Self-pay | Admitting: Family Medicine

## 2023-04-24 DIAGNOSIS — Z87891 Personal history of nicotine dependence: Secondary | ICD-10-CM

## 2023-04-24 DIAGNOSIS — F17210 Nicotine dependence, cigarettes, uncomplicated: Secondary | ICD-10-CM

## 2023-04-24 DIAGNOSIS — Z122 Encounter for screening for malignant neoplasm of respiratory organs: Secondary | ICD-10-CM

## 2023-04-24 DIAGNOSIS — I700 Atherosclerosis of aorta: Secondary | ICD-10-CM | POA: Insufficient documentation

## 2023-04-24 LAB — MAGNESIUM: Magnesium: 1.8 mg/dL (ref 1.6–2.3)

## 2023-04-26 ENCOUNTER — Ambulatory Visit: Payer: Medicare HMO | Attending: Cardiology

## 2023-04-26 DIAGNOSIS — Z5181 Encounter for therapeutic drug level monitoring: Secondary | ICD-10-CM | POA: Diagnosis not present

## 2023-04-26 DIAGNOSIS — I4891 Unspecified atrial fibrillation: Secondary | ICD-10-CM | POA: Diagnosis not present

## 2023-04-26 LAB — POCT INR: INR: 2 (ref 2.0–3.0)

## 2023-04-26 NOTE — Patient Instructions (Signed)
Description   Continue on same dosage of Warfarin 1/2 tablet daily except 1 tablet on Tuesdays and Saturdays Recheck in 3 weeks.

## 2023-04-30 ENCOUNTER — Telehealth: Payer: Self-pay | Admitting: Cardiology

## 2023-04-30 ENCOUNTER — Other Ambulatory Visit: Payer: Self-pay | Admitting: Nurse Practitioner

## 2023-04-30 ENCOUNTER — Ambulatory Visit: Payer: Medicare HMO | Attending: Nurse Practitioner

## 2023-04-30 ENCOUNTER — Telehealth: Payer: Self-pay | Admitting: Nurse Practitioner

## 2023-04-30 ENCOUNTER — Other Ambulatory Visit: Payer: Self-pay

## 2023-04-30 DIAGNOSIS — R002 Palpitations: Secondary | ICD-10-CM

## 2023-04-30 DIAGNOSIS — I5022 Chronic systolic (congestive) heart failure: Secondary | ICD-10-CM

## 2023-04-30 DIAGNOSIS — Z79899 Other long term (current) drug therapy: Secondary | ICD-10-CM

## 2023-04-30 MED ORDER — LOSARTAN POTASSIUM 50 MG PO TABS: 50.0000 mg | ORAL_TABLET | Freq: Every day | ORAL | 5 refills | Status: DC

## 2023-04-30 NOTE — Telephone Encounter (Signed)
Checking percert on the following patient for    LONG TERM MONITOR

## 2023-04-30 NOTE — Telephone Encounter (Signed)
Pt c/o BP issue: STAT if pt c/o blurred vision, one-sided weakness or slurred speech  1. What are your last 5 BP readings?  9/19: 13082 79 9/20: 143/84 85 9/21: 145/83 76 9/22: 153/90 63 9/23: 144/93 71  2. Are you having any other symptoms (ex. Dizziness, headache, blurred vision, passed out)?  Palpitations (felt like heart was jumping out of chest)  3. What is your BP issue?  Patient's wife states patient's BP has been elevated the past 3 days +  last night he felt like his heart was beating out of his chest.

## 2023-04-30 NOTE — Telephone Encounter (Signed)
Patient agreered to this will come by the office today to pick up labs and have monitor placed

## 2023-05-09 ENCOUNTER — Ambulatory Visit: Payer: Medicare HMO | Admitting: Gastroenterology

## 2023-05-10 ENCOUNTER — Other Ambulatory Visit: Payer: Medicare HMO

## 2023-05-10 DIAGNOSIS — I5022 Chronic systolic (congestive) heart failure: Secondary | ICD-10-CM | POA: Diagnosis not present

## 2023-05-10 DIAGNOSIS — Z79899 Other long term (current) drug therapy: Secondary | ICD-10-CM | POA: Diagnosis not present

## 2023-05-11 LAB — BASIC METABOLIC PANEL
BUN/Creatinine Ratio: 12 (ref 10–24)
BUN: 19 mg/dL (ref 8–27)
CO2: 23 mmol/L (ref 20–29)
Calcium: 9.9 mg/dL (ref 8.6–10.2)
Chloride: 98 mmol/L (ref 96–106)
Creatinine, Ser: 1.6 mg/dL — ABNORMAL HIGH (ref 0.76–1.27)
Glucose: 112 mg/dL — ABNORMAL HIGH (ref 70–99)
Potassium: 4.2 mmol/L (ref 3.5–5.2)
Sodium: 136 mmol/L (ref 134–144)
eGFR: 45 mL/min/{1.73_m2} — ABNORMAL LOW (ref >59)

## 2023-05-12 ENCOUNTER — Other Ambulatory Visit: Payer: Self-pay | Admitting: Family Medicine

## 2023-05-12 DIAGNOSIS — Z8739 Personal history of other diseases of the musculoskeletal system and connective tissue: Secondary | ICD-10-CM

## 2023-05-14 ENCOUNTER — Observation Stay (HOSPITAL_COMMUNITY)
Admission: EM | Admit: 2023-05-14 | Discharge: 2023-05-15 | Disposition: A | Discharge status: Home / Self Care | Payer: Medicare HMO | Attending: Internal Medicine | Admitting: Internal Medicine

## 2023-05-14 ENCOUNTER — Encounter (HOSPITAL_COMMUNITY): Payer: Self-pay | Admitting: Emergency Medicine

## 2023-05-14 ENCOUNTER — Telehealth: Payer: Self-pay | Admitting: *Deleted

## 2023-05-14 ENCOUNTER — Emergency Department (HOSPITAL_COMMUNITY): Payer: Medicare HMO

## 2023-05-14 ENCOUNTER — Other Ambulatory Visit: Payer: Self-pay

## 2023-05-14 DIAGNOSIS — E782 Mixed hyperlipidemia: Secondary | ICD-10-CM | POA: Diagnosis present

## 2023-05-14 DIAGNOSIS — Z7901 Long term (current) use of anticoagulants: Secondary | ICD-10-CM | POA: Diagnosis not present

## 2023-05-14 DIAGNOSIS — R7989 Other specified abnormal findings of blood chemistry: Secondary | ICD-10-CM | POA: Insufficient documentation

## 2023-05-14 DIAGNOSIS — R770 Abnormality of albumin: Secondary | ICD-10-CM | POA: Insufficient documentation

## 2023-05-14 DIAGNOSIS — R55 Syncope and collapse: Secondary | ICD-10-CM | POA: Diagnosis not present

## 2023-05-14 DIAGNOSIS — I110 Hypertensive heart disease with heart failure: Secondary | ICD-10-CM | POA: Diagnosis not present

## 2023-05-14 DIAGNOSIS — E860 Dehydration: Secondary | ICD-10-CM | POA: Diagnosis not present

## 2023-05-14 DIAGNOSIS — J449 Chronic obstructive pulmonary disease, unspecified: Secondary | ICD-10-CM | POA: Diagnosis present

## 2023-05-14 DIAGNOSIS — Z8546 Personal history of malignant neoplasm of prostate: Secondary | ICD-10-CM | POA: Diagnosis not present

## 2023-05-14 DIAGNOSIS — F17290 Nicotine dependence, other tobacco product, uncomplicated: Secondary | ICD-10-CM | POA: Diagnosis not present

## 2023-05-14 DIAGNOSIS — R001 Bradycardia, unspecified: Secondary | ICD-10-CM | POA: Diagnosis not present

## 2023-05-14 DIAGNOSIS — K219 Gastro-esophageal reflux disease without esophagitis: Secondary | ICD-10-CM | POA: Diagnosis present

## 2023-05-14 DIAGNOSIS — I4819 Other persistent atrial fibrillation: Secondary | ICD-10-CM | POA: Diagnosis present

## 2023-05-14 DIAGNOSIS — E8809 Other disorders of plasma-protein metabolism, not elsewhere classified: Secondary | ICD-10-CM | POA: Insufficient documentation

## 2023-05-14 DIAGNOSIS — Z951 Presence of aortocoronary bypass graft: Secondary | ICD-10-CM | POA: Diagnosis not present

## 2023-05-14 DIAGNOSIS — I2510 Atherosclerotic heart disease of native coronary artery without angina pectoris: Secondary | ICD-10-CM | POA: Diagnosis present

## 2023-05-14 DIAGNOSIS — I10 Essential (primary) hypertension: Secondary | ICD-10-CM | POA: Diagnosis not present

## 2023-05-14 DIAGNOSIS — E46 Unspecified protein-calorie malnutrition: Secondary | ICD-10-CM | POA: Insufficient documentation

## 2023-05-14 DIAGNOSIS — J40 Bronchitis, not specified as acute or chronic: Secondary | ICD-10-CM | POA: Diagnosis not present

## 2023-05-14 DIAGNOSIS — J439 Emphysema, unspecified: Secondary | ICD-10-CM | POA: Diagnosis not present

## 2023-05-14 DIAGNOSIS — I5022 Chronic systolic (congestive) heart failure: Secondary | ICD-10-CM | POA: Diagnosis present

## 2023-05-14 DIAGNOSIS — E440 Moderate protein-calorie malnutrition: Secondary | ICD-10-CM | POA: Insufficient documentation

## 2023-05-14 DIAGNOSIS — I4891 Unspecified atrial fibrillation: Secondary | ICD-10-CM | POA: Diagnosis not present

## 2023-05-14 DIAGNOSIS — Z79899 Other long term (current) drug therapy: Secondary | ICD-10-CM | POA: Insufficient documentation

## 2023-05-14 DIAGNOSIS — Z8551 Personal history of malignant neoplasm of bladder: Secondary | ICD-10-CM | POA: Diagnosis not present

## 2023-05-14 DIAGNOSIS — I5030 Unspecified diastolic (congestive) heart failure: Secondary | ICD-10-CM | POA: Diagnosis not present

## 2023-05-14 DIAGNOSIS — Z720 Tobacco use: Secondary | ICD-10-CM | POA: Diagnosis present

## 2023-05-14 DIAGNOSIS — I959 Hypotension, unspecified: Secondary | ICD-10-CM | POA: Diagnosis not present

## 2023-05-14 LAB — URINALYSIS, ROUTINE W REFLEX MICROSCOPIC
Bacteria, UA: NONE SEEN
Bilirubin Urine: NEGATIVE
Glucose, UA: NEGATIVE mg/dL
Hgb urine dipstick: NEGATIVE
Ketones, ur: NEGATIVE mg/dL
Leukocytes,Ua: NEGATIVE
Nitrite: NEGATIVE
Protein, ur: 30 mg/dL — AB
Specific Gravity, Urine: 1.009 (ref 1.005–1.030)
pH: 6 (ref 5.0–8.0)

## 2023-05-14 LAB — CBC
HCT: 36.9 % — ABNORMAL LOW (ref 39.0–52.0)
Hemoglobin: 12.5 g/dL — ABNORMAL LOW (ref 13.0–17.0)
MCH: 34.2 pg — ABNORMAL HIGH (ref 26.0–34.0)
MCHC: 33.9 g/dL (ref 30.0–36.0)
MCV: 100.8 fL — ABNORMAL HIGH (ref 80.0–100.0)
Platelets: 152 10*3/uL (ref 150–400)
RBC: 3.66 MIL/uL — ABNORMAL LOW (ref 4.22–5.81)
RDW: 13.3 % (ref 11.5–15.5)
WBC: 6.7 10*3/uL (ref 4.0–10.5)
nRBC: 0 % (ref 0.0–0.2)

## 2023-05-14 LAB — BASIC METABOLIC PANEL
Anion gap: 7 (ref 5–15)
BUN: 12 mg/dL (ref 8–23)
CO2: 22 mmol/L (ref 22–32)
Calcium: 7.9 mg/dL — ABNORMAL LOW (ref 8.9–10.3)
Chloride: 102 mmol/L (ref 98–111)
Creatinine, Ser: 1.19 mg/dL (ref 0.61–1.24)
GFR, Estimated: >60 mL/min (ref >60)
Glucose, Bld: 80 mg/dL (ref 70–99)
Potassium: 3.6 mmol/L (ref 3.5–5.1)
Sodium: 131 mmol/L — ABNORMAL LOW (ref 135–145)

## 2023-05-14 LAB — PROTIME-INR
INR: 2.7 — ABNORMAL HIGH (ref 0.8–1.2)
Prothrombin Time: 29 s — ABNORMAL HIGH (ref 11.4–15.2)

## 2023-05-14 LAB — HEPATIC FUNCTION PANEL
ALT: 33 U/L (ref 0–44)
AST: 60 U/L — ABNORMAL HIGH (ref 15–41)
Albumin: 3.3 g/dL — ABNORMAL LOW (ref 3.5–5.0)
Alkaline Phosphatase: 68 U/L (ref 38–126)
Bilirubin, Direct: 0.1 mg/dL (ref 0.0–0.2)
Indirect Bilirubin: 0.3 mg/dL (ref 0.3–0.9)
Total Bilirubin: 0.4 mg/dL (ref 0.3–1.2)
Total Protein: 6.1 g/dL — ABNORMAL LOW (ref 6.5–8.1)

## 2023-05-14 LAB — MAGNESIUM: Magnesium: 1.6 mg/dL — ABNORMAL LOW (ref 1.7–2.4)

## 2023-05-14 LAB — BRAIN NATRIURETIC PEPTIDE: B Natriuretic Peptide: 144 pg/mL — ABNORMAL HIGH (ref 0.0–100.0)

## 2023-05-14 LAB — CBG MONITORING, ED: Glucose-Capillary: 77 mg/dL (ref 70–99)

## 2023-05-14 LAB — TROPONIN I (HIGH SENSITIVITY)
Troponin I (High Sensitivity): 13 ng/L (ref <18)
Troponin I (High Sensitivity): 13 ng/L (ref <18)

## 2023-05-14 MED ORDER — ALBUTEROL SULFATE (2.5 MG/3ML) 0.083% IN NEBU: 2.5000 mg | INHALATION_SOLUTION | Freq: Four times a day (QID) | RESPIRATORY_TRACT | Status: DC | PRN

## 2023-05-14 MED ORDER — LACTATED RINGERS IV SOLN: INTRAVENOUS | Status: DC

## 2023-05-14 MED ORDER — ACETAMINOPHEN 325 MG PO TABS: 650.0000 mg | ORAL_TABLET | Freq: Four times a day (QID) | ORAL | Status: DC | PRN

## 2023-05-14 MED ORDER — BUDESON-GLYCOPYRROL-FORMOTEROL 160-9-4.8 MCG/ACT IN AERO: 2.0000 | INHALATION_SPRAY | Freq: Two times a day (BID) | RESPIRATORY_TRACT | Status: DC

## 2023-05-14 MED ORDER — ATORVASTATIN CALCIUM 40 MG PO TABS
80.0000 mg | ORAL_TABLET | Freq: Every day | ORAL | Status: DC
  Administered 2023-05-15: 80 mg via ORAL
  Filled 2023-05-14: qty 2

## 2023-05-14 MED ORDER — ONDANSETRON HCL 4 MG PO TABS: 4.0000 mg | ORAL_TABLET | Freq: Four times a day (QID) | ORAL | Status: DC | PRN

## 2023-05-14 MED ORDER — CLOPIDOGREL BISULFATE 75 MG PO TABS
75.0000 mg | ORAL_TABLET | Freq: Every day | ORAL | Status: DC
  Administered 2023-05-15: 75 mg via ORAL
  Filled 2023-05-14: qty 1

## 2023-05-14 MED ORDER — MAGNESIUM SULFATE 2 GM/50ML IV SOLN
2.0000 g | Freq: Once | INTRAVENOUS | Status: AC
  Administered 2023-05-14: 2 g via INTRAVENOUS
  Filled 2023-05-14: qty 50

## 2023-05-14 MED ORDER — MOMETASONE FURO-FORMOTEROL FUM 200-5 MCG/ACT IN AERO
2.0000 | INHALATION_SPRAY | Freq: Two times a day (BID) | RESPIRATORY_TRACT | Status: DC
  Administered 2023-05-15: 2 via RESPIRATORY_TRACT
  Filled 2023-05-14: qty 8.8

## 2023-05-14 MED ORDER — ACETAMINOPHEN 325 MG PO TABS
650.0000 mg | ORAL_TABLET | Freq: Once | ORAL | Status: AC
  Administered 2023-05-14: 650 mg via ORAL
  Filled 2023-05-14: qty 2

## 2023-05-14 MED ORDER — METOPROLOL SUCCINATE ER 50 MG PO TB24
75.0000 mg | ORAL_TABLET | Freq: Every day | ORAL | Status: DC
  Administered 2023-05-15: 75 mg via ORAL
  Filled 2023-05-14: qty 1

## 2023-05-14 MED ORDER — ALBUTEROL SULFATE HFA 108 (90 BASE) MCG/ACT IN AERS: 2.0000 | INHALATION_SPRAY | Freq: Four times a day (QID) | RESPIRATORY_TRACT | Status: DC | PRN

## 2023-05-14 MED ORDER — LOSARTAN POTASSIUM 50 MG PO TABS
50.0000 mg | ORAL_TABLET | Freq: Every day | ORAL | Status: DC
  Administered 2023-05-15: 50 mg via ORAL
  Filled 2023-05-14: qty 1

## 2023-05-14 MED ORDER — UMECLIDINIUM BROMIDE 62.5 MCG/ACT IN AEPB
1.0000 | INHALATION_SPRAY | Freq: Every day | RESPIRATORY_TRACT | Status: DC
  Administered 2023-05-15: 1 via RESPIRATORY_TRACT
  Filled 2023-05-14: qty 7

## 2023-05-14 MED ORDER — EMPAGLIFLOZIN 10 MG PO TABS
10.0000 mg | ORAL_TABLET | Freq: Every day | ORAL | Status: DC
  Administered 2023-05-15: 10 mg via ORAL
  Filled 2023-05-14 (×3): qty 1

## 2023-05-14 MED ORDER — WARFARIN SODIUM 5 MG PO TABS: 5.0000 mg | ORAL_TABLET | Freq: Every day | ORAL | Status: DC

## 2023-05-14 MED ORDER — PANTOPRAZOLE SODIUM 40 MG PO TBEC
40.0000 mg | DELAYED_RELEASE_TABLET | Freq: Every day | ORAL | Status: DC
  Administered 2023-05-15: 40 mg via ORAL
  Filled 2023-05-14: qty 1

## 2023-05-14 MED ORDER — ENSURE ENLIVE PO LIQD
237.0000 mL | Freq: Two times a day (BID) | ORAL | Status: DC
  Filled 2023-05-14 (×2): qty 237

## 2023-05-14 MED ORDER — ONDANSETRON HCL 4 MG/2ML IJ SOLN: 4.0000 mg | Freq: Four times a day (QID) | INTRAMUSCULAR | Status: DC | PRN

## 2023-05-14 MED ORDER — ACETAMINOPHEN 650 MG RE SUPP: 650.0000 mg | Freq: Four times a day (QID) | RECTAL | Status: DC | PRN

## 2023-05-14 NOTE — Telephone Encounter (Signed)
Called pt to relay test results.  Informed by wife that pt just had 2 syncopal episodes and headache all day. When she checked his BP it was noted to be 93/53 Hr. 65. Wife reports that pt is currently wearing a monitor that is d/t come off at 3 pm. Encouraged wife to call EMS and have pt seen in ER.

## 2023-05-14 NOTE — Telephone Encounter (Signed)
-----   Message from Sharlene Dory sent at 05/13/2023  6:13 PM EDT ----- Kidney function is elevated, most likely due to dehydration. Please encourage patient to stay well hydrated and repeat BMET in 2 weeks for diagnosis of elevated serum creatinine.   Thanks!  Sharlene Dory, AGNP-C

## 2023-05-14 NOTE — ED Provider Notes (Signed)
[place] EMERGENCY DEPARTMENT AT [facility] Provider Note   CSN: [id number] Arrival date & time: 05/14/23  1558     History  Chief Complaint  Patient presents with   Loss of Consciousness    [name] is a 77 y.o. male.   Loss of Consciousness Associated symptoms: headaches and weakness   Patient presents for syncope.  Medical history includes CAD, HTN, GERD, COPD, BPH, atrial fibrillation, bladder cancer, prostate cancer, CHF.  Home medications include statin, Lasix, losartan, metoprolol, spironolactone, warfarin.  He did take his morning medications today.  At around 1 PM, he developed a generalized headache.  At around 2 PM, while getting out of his truck, he fell to the ground.  He describes this as an acute onset of generalized weakness.  He does not feel that he passed out at the time.  His wife witnessed the event.  Patient was able to stand back up.  As he was walking on the front of the truck, he had a second episode where he fell to the ground.  This time, he was unresponsive.  LOC lasted for 1 to 2 minutes.  When he came to, he required 2 person assist to get inside the home.  As he got inside the home, his cardiology office called.  Patient has recently been wearing a cardiac monitor.  He recently underwent outpatient lab work.  Cardiology office informed him that he had an AKI and advised him to hydrate.  His wife informed cardiology office of the syncopal episodes.  They advised her to call 911.  Patient was noted to be hypotensive on scene.  He received 1500 cc IVF prior to arrival with normalization of blood pressure.  Currently, he endorses a very mild headache.  He denies any other current symptoms.  He denies any areas of pain or suspected injury since his falls.    Home Medications Prior to Admission medications   Medication Sig Start Date End Date Taking? Authorizing Provider  acetaminophen (TYLENOL) 500 MG tablet Take 2 tablets (1,000 mg total) by  mouth every 6 (six) hours as needed for moderate pain or headache. 03/03/20   Mechele Claude, MD  albuterol (VENTOLIN HFA) 108 (90 Base) MCG/ACT inhaler Inhale 2 puffs into the lungs every 6 (six) hours as needed for wheezing or shortness of breath. 05/30/22   Mechele Claude, MD  allopurinol (ZYLOPRIM) 100 MG tablet Take 1 tablet (100 mg total) by mouth daily. 05/14/23   Mechele Claude, MD  atorvastatin (LIPITOR) 80 MG tablet Take 1 tablet (80 mg total) by mouth daily. 03/20/23   Sharlene Dory, NP  B Complex Vitamins (VITAMIN B COMPLEX PO) Take 1 tablet by mouth daily.    [provider]  Budeson-Glycopyrrol-Formoterol (BREZTRI AEROSPHERE) 160-9-4.8 MCG/ACT AERO Take 2 puffs by mouth in the morning and at bedtime. Dispense 3 inhalers 12/06/21   Mechele Claude, MD  Cholecalciferol (VITAMIN D3) 125 MCG (5000 UT) CAPS Take 5,000 Units by mouth daily.    [provider]  clopidogrel (PLAVIX) 75 MG tablet Take 1 tablet (75 mg total) by mouth daily. Continue this for 1 year 03/20/23   Sharlene Dory, NP  empagliflozin (JARDIANCE) 10 MG TABS tablet Take 1 tablet (10 mg total) by mouth daily before breakfast. 03/20/23   Sharlene Dory, NP  fluticasone [facility]) 50 MCG/ACT nasal spray Place 2 sprays into both nostrils daily as needed for allergies. 11/24/20   Mechele Claude, MD  furosemide (LASIX) 20 MG tablet Take 1  tablet (20 mg total) by mouth as needed (swelling). 03/20/23   Sharlene Dory, NP  losartan (COZAAR) 50 MG tablet Take 1 tablet (50 mg total) by mouth daily. 04/30/23   Sharlene Dory, NP  Magnesium Oxide 400 MG CAPS Take 1 capsule (400 mg total) by mouth 2 (two) times daily at 10 AM and 5 PM. 04/10/23   Sharlene Dory, NP  metoprolol succinate (TOPROL XL) 50 MG 24 hr tablet Take 1.5 tablets (75 mg total) by mouth daily. 03/20/23   Sharlene Dory, NP  nitroGLYCERIN (NITROSTAT) 0.4 MG SL tablet Place 1 tablet (0.4 mg total) under the tongue every 5 (five) minutes as needed for chest  pain. 11/24/20   Mechele Claude, MD  pantoprazole (PROTONIX) 40 MG tablet TAKE 1 TABLET EVERY DAY 02/05/23   Mechele Claude, MD  Potassium & Sodium Phosphates (PHOSPHORUS SUPPLEMENT) 280-160-250 MG PACK Take 1 packet by mouth 2 (two) times daily. In water or juice 12/13/22   Mechele Claude, MD  spironolactone (ALDACTONE) 25 MG tablet Take 0.5 tablets (12.5 mg total) by mouth 2 (two) times daily. 03/20/23   Sharlene Dory, NP  warfarin (COUMADIN) 5 MG tablet Take 5 mg by mouth daily. Take 1/2 - to 1 tablet daily or as directed by coumadin clinic.    [provider]      Allergies    Patient has no known allergies.    Review of Systems   Review of Systems  Cardiovascular:  Positive for syncope.  Neurological:  Positive for syncope, weakness and headaches.  All other systems reviewed and are negative.   Physical Exam Updated Vital Signs BP 137/76   Pulse (!) 59   Temp 98.6 F (37 C) (Oral)   Resp 15   Ht 5\' 6"  (1.676 m)   Wt 67.6 kg   SpO2 97%   BMI 24.05 kg/m  Physical Exam Vitals and nursing note reviewed.  Constitutional:      General: He is not in acute distress.    Appearance: Normal appearance. He is well-developed. He is not ill-appearing, toxic-appearing or diaphoretic.  HENT:     Head: Normocephalic and atraumatic.     Right Ear: External ear normal.     Left Ear: External ear normal.     Nose: Nose normal.     Mouth/Throat:     Mouth: Mucous membranes are moist.  Eyes:     Extraocular Movements: Extraocular movements intact.     Conjunctiva/sclera: Conjunctivae normal.  Cardiovascular:     Rate and Rhythm: Normal rate. Rhythm irregular.     Heart sounds: No murmur heard. Pulmonary:     Effort: Pulmonary effort is normal. No respiratory distress.     Breath sounds: Normal breath sounds. No wheezing or rales.  Chest:     Chest wall: No tenderness.  Abdominal:     General: There is no distension.     Palpations: Abdomen is soft.     Tenderness: There is  no abdominal tenderness.  Musculoskeletal:        General: No swelling. Normal range of motion.     Cervical back: Normal range of motion and neck supple.     Right lower leg: No edema.     Left lower leg: No edema.  Skin:    General: Skin is warm and dry.     Coloration: Skin is not jaundiced or pale.  Neurological:     General: No focal deficit present.     Mental Status: He is  alert and oriented to person, place, and time.     Cranial Nerves: No cranial nerve deficit.     Sensory: No sensory deficit.     Motor: No weakness.     Coordination: Coordination normal.  Psychiatric:        Mood and Affect: Mood normal.        Behavior: Behavior normal.     ED Results / Procedures / Treatments   Labs (all labs ordered are listed, but only abnormal results are displayed) Labs Reviewed  BASIC METABOLIC PANEL - Abnormal; Notable for the following components:      Result Value   Sodium 131 (*)    Calcium 7.9 (*)    All other components within normal limits  CBC - Abnormal; Notable for the following components:   RBC 3.66 (*)    Hemoglobin 12.5 (*)    HCT 36.9 (*)    MCV 100.8 (*)    MCH 34.2 (*)    All other components within normal limits  URINALYSIS, ROUTINE W REFLEX MICROSCOPIC - Abnormal; Notable for the following components:   Protein, ur 30 (*)    All other components within normal limits  PROTIME-INR - Abnormal; Notable for the following components:   Prothrombin Time 29.0 (*)    INR 2.7 (*)    All other components within normal limits  BRAIN NATRIURETIC PEPTIDE - Abnormal; Notable for the following components:   B Natriuretic Peptide 144.0 (*)    All other components within normal limits  HEPATIC FUNCTION PANEL - Abnormal; Notable for the following components:   Total Protein 6.1 (*)    Albumin 3.3 (*)    AST 60 (*)    All other components within normal limits  MAGNESIUM - Abnormal; Notable for the following components:   Magnesium 1.6 (*)    All other components  within normal limits  CBG MONITORING, ED  TROPONIN I (HIGH SENSITIVITY)  TROPONIN I (HIGH SENSITIVITY)    EKG EKG Interpretation Date/Time:  Monday May 14 2023 16:24:15 EDT Ventricular Rate:  65 PR Interval:    QRS Duration:  121 QT Interval:  454 QTC Calculation: 454 R Axis:   -76  Text Interpretation: Atrial fibrillation Nonspecific IVCD with LAD LVH with secondary repolarization abnormality Anterior Q waves, possibly due to LVH Confirmed by Gloris Manchester ([id number]) on 05/14/2023 5:04:33 PM  Radiology CT Head Wo Contrast  Result Date: 05/14/2023 CLINICAL DATA:  Syncope/presyncope, cerebrovascular cause suspected. EXAM: CT HEAD WITHOUT CONTRAST TECHNIQUE: Contiguous axial images were obtained from the base of the skull through the vertex without intravenous contrast. RADIATION DOSE REDUCTION: This exam was performed according to the departmental dose-optimization program which includes automated exposure control, adjustment of the mA and/or kV according to patient size and/or use of iterative reconstruction technique. COMPARISON:  None Available. FINDINGS: Brain: No acute hemorrhage. Cortical gray-white differentiation is preserved. Patchy hypoattenuation of the cerebral white matter, most consistent with mild chronic small-vessel disease. Prominence of the ventricles and sulci within normal limits for age. No extra-axial collection. Basilar cisterns are patent. Vascular: No hyperdense vessel or unexpected calcification. Skull: No calvarial fracture or suspicious bone lesion. Skull base is unremarkable. Sinuses/Orbits: No acute finding. Other: None. IMPRESSION: 1. No acute intracranial abnormality. 2. Mild chronic small-vessel disease. Electronically Signed   By: Orvan Falconer M.D.   On: 05/14/2023 18:54    Procedures Procedures    Medications Ordered in ED Medications  magnesium sulfate IVPB 2 g 50 mL (0 g Intravenous Stopped  05/14/23 1912)  acetaminophen (TYLENOL) tablet 650 mg (650 mg  Oral Given 05/14/23 1803)    ED Course/ Medical Decision Making/ A&P                                 Medical Decision Making Amount and/or Complexity of Data Reviewed Labs: ordered. Radiology: ordered.  Risk OTC drugs. Prescription drug management.   This patient presents to the ED for concern of syncope, this involves an extensive number of treatment options, and is a complaint that carries with it a high risk of complications and morbidity.  The differential diagnosis includes arrhythmia, vasovagal episode, anemia, metabolic derangements, CVA, TIA, seizure   Co morbidities that complicate the patient evaluation  CAD, HTN, GERD, COPD, BPH, atrial fibrillation, bladder cancer, prostate cancer, CHF   Additional history obtained:  Additional history obtained from patient's wife External records from outside source obtained and reviewed including EMR   Lab Tests:  I Ordered, and personally interpreted labs.  The pertinent results include: Normal kidney function, hypomagnesemia with otherwise normal electrolytes, normal troponin, mildly elevated BNP, baseline anemia, no leukocytosis, therapeutic INR   Imaging Studies ordered:  I ordered imaging studies including CT head I independently visualized and interpreted imaging which showed no acute findings I agree with the radiologist interpretation   Cardiac Monitoring: / EKG:  The patient was maintained on a cardiac monitor.  I personally viewed and interpreted the cardiac monitored which showed an underlying rhythm of: Atrial flutter with variable conduction  Problem List / ED Course / Critical interventions / Medication management  Patient presents for syncope.  He has extensive cardiac history.  He has had palpitations recently and has been wearing a Zio patch.  Earlier today he felt to be in his normal state of health.  He had a witnessed syncopal episode by his wife.  Duration of LOC was 1 to 2 minutes.  Afterwards, he  required 2 person assist to stand and walk.  He did have hypotension following this episode which resolved with IV fluids that he received with EMS.  On arrival, he is normotensive.  He is mildly bradycardic with a rhythm of atrial flutter with variable conduction.  He was placed on bedside cardiac monitor.  On exam, he has no focal neurologic deficits.  His breathing is currently unlabored.  Workup was initiated.  Lab work is notable for hypomagnesemia.  Although he did have a recent diagnosis of AKI, this does appear improved on today's lab work.  Replacement magnesium was ordered.  While on monitor, he remained in atrial flutter.  He had one 4 beat run of PVCs.  Patient is agreeable to admission for high risk syncope. I ordered medication including Tylenol for headache; magnesium sulfate for hypomagnesemia Reevaluation of the patient after these medicines showed that the patient improved I have reviewed the patients home medicines and have made adjustments as needed   Social Determinants of Health:  Has access to outpatient care        Final Clinical Impression(s) / ED Diagnoses Final diagnoses:  Syncope and collapse    Rx / DC Orders ED Discharge Orders     None         Gloris Manchester, MD 05/14/23 [phone]

## 2023-05-14 NOTE — H&P (Signed)
History and Physical    Patient: [name]:[id number] DOB: June 10, 1946 DOA: 05/14/2023 DOS: the patient was seen and examined on 05/14/2023 PCP: Ralph Claude, MD  Patient coming from: Home  Chief Complaint:  Chief Complaint  Patient presents with   Loss of Consciousness   HPI: [name] is a 77 y.o. male with medical history significant of hypertension, persistent atrial fibrillation on warfarin, chronic HFrEF, CAD s/p CABG, COPD, GERD, tobacco use disorder, alcohol use who presents to the emergency department due to transitory loss of consciousness.  Patient states that he fell to the ground while getting out of his truck around 2 PM today, he complained of sudden acute onset generalized weakness and he did not think that he lost consciousness, he was able to get up any assist.  While walking in front of the truck, he fell to the ground again, but this time he lost consciousness for about 1 to 2 minutes and required 2 person assist to get into the house.  Apparently, patient has been wearing his Zio patch for few weeks.  Shortly after being inside the house, cardiology office called to notify him that he had an acute kidney injury from recent labs done and was advised to hydrate himself.  While still on the phone with the cardiology office, wife, who witnessed both falls told the office about the syncopal episodes and was advised to activate EMS.  On arrival of EMS team, patient was noted to be hypotensive and 1.5 L of IV fluid was provided PTA with improvement in blood pressure.  Patient was then taken to the ED for further evaluation and management.  ED Course:  In the emergency department, he was bradycardic, but other vital signs were within normal range.  Workup in the ED showed MCV 100.8, hemoglobin 12.5, hematocrit 36.9, platelets 152.  BMP was normal except for sodium of 131.  Magnesium 1.6 albumin 3.3, AST 60, ALT 33, BNP 144, urinalysis was normal. CT head without contrast  showed no acute intracranial abnormality. Tylenol was given, magnesium was replenished. Hospitalist was asked to admit patient for further evaluation and management.  Review of Systems: Review of systems as noted in the HPI. All other systems reviewed and are negative.   Past Medical History:  Diagnosis Date   Anginal pain (HCC)    Atrial fibrillation by electrocardiogram (HCC) 11/2019   Atrial flutter by electrocardiogram [facility]) 11/2019   Bladder cancer (HCC) 01/2020   Bladder tumor    BPH (benign prostatic hyperplasia)    CAD (coronary artery disease)    a. s/p INF MI 1997;  b. s/p CABG 2001;  c. LHC 11/2009:  3v CAD, S-PDA ok with 40% mid, S-OM ok, S-Dx ok, L-LAD ok, EF 50%;  d.  Lex MV 5/14:  Inferolateral scar, EF 46%, no ischemia   COPD (chronic obstructive pulmonary disease) (HCC)    Dyspnea    ETOH abuse    GERD (gastroesophageal reflux disease)    History of gout    Hypercholesterolemia    Hypertension    Myocardial infarction [facility]) 2013   Prostate cancer (HCC)    Tobacco abuse    Past Surgical History:  Procedure Laterality Date   ANTERIOR CERVICAL DECOMPRESSION/DISCECTOMY FUSION 4 LEVELS N/A 07/21/2016   Procedure: ANTERIOR CERVICAL DECOMPRESSION/DISCECTOMY FUSION CERVICAL TWO-THREE, CERVICAL THREE-FOUR,. CERVICAL FOUR-FIVE, CERVICAL  FIVE-SIX;  Surgeon: Ralph Memos, MD;  Location: [facility] OR;  Service: Neurosurgery;  Laterality: N/A;   BACK SURGERY     CARDIAC  CATHETERIZATION  2011   CARDIOVERSION N/A 06/10/2020   Procedure: CARDIOVERSION;  Surgeon: Ralph Poche, MD;  Location: [facility] ENDO SUITE;  Service: Endoscopy;  Laterality: N/A;   COLONOSCOPY     in remote past, Dr. Jarold [name]. Obtaining records.    COLONOSCOPY N/A 11/21/2017   Surgeon: Ralph Ade, MD; Three 4-6 mm polyps in the rectum and splenic flexure resected and retrieved (tubular adenomas), nonbleeding internal hemorrhoids.  Repeat in 5 years.   COLONOSCOPY WITH PROPOFOL N/A 06/09/2021   Surgeon:  Ralph Ade, MD;  Three 3-7 mm polyps in the ascending colon resected and retrieved, mild radiation proctitis, otherwise normal exam. Pathology revealed tubular adenomas.  Recommended repeat colonoscopy in 5 years.   CORONARY ANGIOPLASTY     CORONARY ARTERY BYPASS GRAFT     CORONARY STENT INTERVENTION N/A 03/27/2022   Procedure: CORONARY STENT INTERVENTION;  Surgeon: [name], Ralph M, MD;  Location: [facility] INVASIVE CV LAB;  Service: Cardiovascular;  Laterality: N/A;   ESOPHAGOGASTRODUODENOSCOPY  05/27/2012   [name]:WRUEAV esophagus, stomach and duodenum s/p dilator   POLYPECTOMY  11/21/2017   Procedure: POLYPECTOMY;  Surgeon: Ralph Ade, MD;  Location: [facility] SUITE;  Service: Endoscopy;;  colon   POLYPECTOMY  06/09/2021   Procedure: POLYPECTOMY;  Surgeon: Ralph Ade, MD;  Location: [facility] SUITE;  Service: Endoscopy;;   PROSTATE SURGERY     RIGHT/LEFT HEART CATH AND CORONARY/GRAFT ANGIOGRAPHY N/A 03/27/2022   Procedure: RIGHT/LEFT HEART CATH AND CORONARY/GRAFT ANGIOGRAPHY;  Surgeon: [name], Ralph M, MD;  Location: [facility] INVASIVE CV LAB;  Service: Cardiovascular;  Laterality: N/A;   TRANSURETHRAL RESECTION OF BLADDER TUMOR WITH MITOMYCIN-C Left 01/06/2020   Procedure: CYSTOSCOPY TRANSURETHRAL RESECTION OF BLADDER TUMOR,LEFT URETERSCOPY AND GEMCITABINE;  Surgeon: Ralph Pippin, MD;  Location: [facility] ORS;  Service: Urology;  Laterality: Left;    Social History:  reports that he has been smoking cigarettes and e-cigarettes. He started smoking about 53 years ago. He has a 58.2 pack-year smoking history. He has never been exposed to tobacco smoke. He quit smokeless tobacco use about 11 years ago.  His smokeless tobacco use included snuff. He reports current alcohol use of about 2.0 - 4.0 standard drinks of alcohol per week. He reports that he does not use drugs.   No Known Allergies  Family History  Problem Relation Age of Onset   Diabetes Mother    Hypertension Father    Heart disease Brother     Diabetes Brother    Heart disease Brother    Diabetes Brother    Multiple sclerosis Daughter    Healthy Son    Colon cancer Neg Hx    Inflammatory bowel disease Neg Hx      Prior to Admission medications   Medication Sig Start Date End Date Taking? Authorizing Provider  acetaminophen (TYLENOL) 500 MG tablet Take 2 tablets (1,000 mg total) by mouth every 6 (six) hours as needed for moderate pain or headache. 03/03/20   Ralph Claude, MD  albuterol (VENTOLIN HFA) 108 (90 Base) MCG/ACT inhaler Inhale 2 puffs into the lungs every 6 (six) hours as needed for wheezing or shortness of breath. 05/30/22   Ralph Claude, MD  allopurinol (ZYLOPRIM) 100 MG tablet Take 1 tablet (100 mg total) by mouth daily. 05/14/23   Ralph Claude, MD  atorvastatin (LIPITOR) 80 MG tablet Take 1 tablet (80 mg total) by mouth daily. 03/20/23   Sharlene Dory, NP  B Complex Vitamins (VITAMIN B COMPLEX PO) Take 1 tablet by mouth daily.  [provider]  Budeson-Glycopyrrol-Formoterol (BREZTRI AEROSPHERE) 160-9-4.8 MCG/ACT AERO Take 2 puffs by mouth in the morning and at bedtime. Dispense 3 inhalers 12/06/21   Ralph Claude, MD  Cholecalciferol (VITAMIN D3) 125 MCG (5000 UT) CAPS Take 5,000 Units by mouth daily.    [provider]  clopidogrel (PLAVIX) 75 MG tablet Take 1 tablet (75 mg total) by mouth daily. Continue this for 1 year 03/20/23   Sharlene Dory, NP  empagliflozin (JARDIANCE) 10 MG TABS tablet Take 1 tablet (10 mg total) by mouth daily before breakfast. 03/20/23   Sharlene Dory, NP  fluticasone [facility]) 50 MCG/ACT nasal spray Place 2 sprays into both nostrils daily as needed for allergies. 11/24/20   Ralph Claude, MD  furosemide (LASIX) 20 MG tablet Take 1 tablet (20 mg total) by mouth as needed (swelling). 03/20/23   Sharlene Dory, NP  losartan (COZAAR) 50 MG tablet Take 1 tablet (50 mg total) by mouth daily. 04/30/23   Sharlene Dory, NP  Magnesium Oxide 400 MG CAPS Take 1 capsule (400  mg total) by mouth 2 (two) times daily at 10 AM and 5 PM. 04/10/23   Sharlene Dory, NP  metoprolol succinate (TOPROL XL) 50 MG 24 hr tablet Take 1.5 tablets (75 mg total) by mouth daily. 03/20/23   Sharlene Dory, NP  nitroGLYCERIN (NITROSTAT) 0.4 MG SL tablet Place 1 tablet (0.4 mg total) under the tongue every 5 (five) minutes as needed for chest pain. 11/24/20   Ralph Claude, MD  pantoprazole (PROTONIX) 40 MG tablet TAKE 1 TABLET EVERY DAY 02/05/23   Ralph Claude, MD  Potassium & Sodium Phosphates (PHOSPHORUS SUPPLEMENT) 280-160-250 MG PACK Take 1 packet by mouth 2 (two) times daily. In water or juice 12/13/22   Ralph Claude, MD  spironolactone (ALDACTONE) 25 MG tablet Take 0.5 tablets (12.5 mg total) by mouth 2 (two) times daily. 03/20/23   Sharlene Dory, NP  warfarin (COUMADIN) 5 MG tablet Take 5 mg by mouth daily. Take 1/2 - to 1 tablet daily or as directed by coumadin clinic.    [provider]    Physical Exam: BP 139/82   Pulse (!) 59   Temp 98.6 F (37 C) (Oral)   Resp 14   Ht 5\' 6"  (1.676 [name])   Wt 67.6 kg   SpO2 97%   BMI 24.05 kg/[name]   General: 77 y.o. year-old male well developed well nourished in no acute distress.  Alert and oriented x3. HEENT: NCAT, EOMI Neck: Supple, trachea medial Cardiovascular: Irregular rhythm, normal rate with no rubs or gallops.  No thyromegaly or JVD noted.  No lower extremity edema. 2/4 pulses in all 4 extremities. Respiratory: Clear to auscultation with no wheezes or rales. Good inspiratory effort. Abdomen: Soft, nontender nondistended with normal bowel sounds x4 quadrants. Muskuloskeletal: No cyanosis, clubbing or edema noted bilaterally Neuro: CN II-XII intact, strength 5/5 x 4, sensation, reflexes intact Skin: No ulcerative lesions noted or rashes Psychiatry: Judgement and insight appear normal. Mood is appropriate for condition and setting          Labs on Admission:  Basic Metabolic Panel: Recent Labs  Lab 05/10/23 1253  05/14/23 1610  NA 136 131*  K 4.2 3.6  CL 98 102  CO2 23 22  GLUCOSE 112* 80  BUN 19 12  CREATININE 1.60* 1.19  CALCIUM 9.9 7.9*  MG  --  1.6*   Liver Function Tests: Recent Labs  Lab 05/14/23 1610  AST 60*  ALT 33  ALKPHOS 68  BILITOT 0.4  PROT 6.1*  ALBUMIN 3.3*   No results for input(s): "LIPASE", "AMYLASE" in the last 168 hours. No results for input(s): "AMMONIA" in the last 168 hours. CBC: Recent Labs  Lab 05/14/23 1610  WBC 6.7  HGB 12.5*  HCT 36.9*  MCV 100.8*  PLT 152   Cardiac Enzymes: No results for input(s): "CKTOTAL", "CKMB", "CKMBINDEX", "TROPONINI" in the last 168 hours.  BNP (last 3 results) Recent Labs    03/26/23 0922 05/14/23 1610  BNP 170.6* 144.0*    ProBNP (last 3 results) No results for input(s): "PROBNP" in the last 8760 hours.  CBG: Recent Labs  Lab 05/14/23 1628  GLUCAP 77    Radiological Exams on Admission: DG Chest Portable 1 View  Result Date: 05/14/2023 CLINICAL DATA:  Syncope EXAM: PORTABLE CHEST 1 VIEW COMPARISON:  12/07/2022 FINDINGS: Hardware in the cervical spine. Post sternotomy changes. Emphysema and bronchitic changes. No consolidation, pleural effusion or pneumothorax. Stable cardiomediastinal silhouette IMPRESSION: Emphysema and bronchitic changes. Electronically Signed   By: Jasmine Pang [name].D.   On: 05/14/2023 20:50   CT Head Wo Contrast  Result Date: 05/14/2023 CLINICAL DATA:  Syncope/presyncope, cerebrovascular cause suspected. EXAM: CT HEAD WITHOUT CONTRAST TECHNIQUE: Contiguous axial images were obtained from the base of the skull through the vertex without intravenous contrast. RADIATION DOSE REDUCTION: This exam was performed according to the departmental dose-optimization program which includes automated exposure control, adjustment of the mA and/or kV according to patient size and/or use of iterative reconstruction technique. COMPARISON:  None Available. FINDINGS: Brain: No acute hemorrhage. Cortical  gray-white differentiation is preserved. Patchy hypoattenuation of the cerebral white matter, most consistent with mild chronic small-vessel disease. Prominence of the ventricles and sulci within normal limits for age. No extra-axial collection. Basilar cisterns are patent. Vascular: No hyperdense vessel or unexpected calcification. Skull: No calvarial fracture or suspicious bone lesion. Skull base is unremarkable. Sinuses/Orbits: No acute finding. Other: None. IMPRESSION: 1. No acute intracranial abnormality. 2. Mild chronic small-vessel disease. Electronically Signed   By: Orvan Falconer [name].D.   On: 05/14/2023 18:54    EKG: I independently viewed the EKG done and my findings are as followed: Atrial fibrillation with rate control  Assessment/Plan Present on Admission:  Syncope  Chronic HFrEF (heart failure with reduced ejection fraction) (HCC)  Persistent atrial fibrillation (HCC)  COPD (chronic obstructive pulmonary disease) (HCC)  Mixed hyperlipidemia  Essential hypertension  CAD (coronary artery disease)  GERD (gastroesophageal reflux disease)  Tobacco abuse  Principal Problem:   Syncope Active Problems:   Mixed hyperlipidemia   Tobacco abuse   GERD (gastroesophageal reflux disease)   CAD (coronary artery disease)   Essential hypertension   COPD (chronic obstructive pulmonary disease) (HCC)   Persistent atrial fibrillation (HCC)   Chronic HFrEF (heart failure with reduced ejection fraction) (HCC)   Hypomagnesemia   Hypoalbuminemia due to protein-calorie malnutrition (HCC)   Elevated brain natriuretic peptide (BNP) level  Acute syncopal episode Continue telemetry and watch for arrhythmias Troponins was flat at 13 EKG personally reviewed showed atrial fibrillation with rate control Echocardiogram done in November 2023 showed LVEF of 45 to 50%.  Positive RWMA.  Mild asymmetric LVH.  Echocardiogram will be done to rule out significant aortic stenosis or other outflow obstruction,  and also to evaluate EF and to rule out segmental/Regional wall motion abnormalities.  Carotid artery Dopplers will be done to rule out hemodynamically significant stenosis  Hypomagnesemia Magnesium 1.6, this was replenished  Hypoalbuminemia possibly secondary to mild protein calorie  malnutrition Albumin 3.3, protein supplement will be provided  Chronic HFrEF Elevated BNP BNP 144 Continue total input/output, daily weights and fluid restriction Continue heart healthy diet  Continue home meds Echocardiogram in the morning   Persistent atrial fibrillation Continue Toprol-XL, warfarin  Mixed hyperlipidemia Continue Lipitor  COPD Continue albuterol, Breztri  Essential hypertension Continue losartan, Toprol-XL  GERD Continue PPI  CAD Continue Plavix, Lipitor, Toprol-XL  Tobacco abuse Patient was counseled on tobacco abuse cessation  Goals of care: Palliative care will be consulted   DVT prophylaxis: Warfarin  Advance Care Planning:   Code Status: Full Code   Consults: None  Family Communication: Son at bedside  Severity of Illness: The appropriate patient status for this patient is OBSERVATION. Observation status is judged to be reasonable and necessary in order to provide the required intensity of service to ensure the patient's safety. The patient's presenting symptoms, physical exam findings, and initial radiographic and laboratory data in the context of their medical condition is felt to place them at decreased risk for further clinical deterioration. Furthermore, it is anticipated that the patient will be medically stable for discharge from the hospital within 2 midnights of admission.   Author: Frankey Shown, DO 05/14/2023 10:34 PM  For on call review [email or web address].

## 2023-05-14 NOTE — ED Triage Notes (Signed)
Syncope x 2 today. Was on home ca monitor. Cbg-85, bp 90/60 pta, denies injury, pta-bp145/95. A and O x 4.

## 2023-05-14 NOTE — ED Notes (Signed)
Urinal at bedside and pt is aware of a sample is needed.

## 2023-05-14 NOTE — Telephone Encounter (Signed)
Wife notified.

## 2023-05-15 ENCOUNTER — Observation Stay (HOSPITAL_BASED_OUTPATIENT_CLINIC_OR_DEPARTMENT_OTHER): Payer: Medicare HMO

## 2023-05-15 ENCOUNTER — Encounter (HOSPITAL_COMMUNITY): Payer: Self-pay | Admitting: Internal Medicine

## 2023-05-15 ENCOUNTER — Observation Stay (HOSPITAL_COMMUNITY): Payer: Medicare HMO

## 2023-05-15 DIAGNOSIS — I6523 Occlusion and stenosis of bilateral carotid arteries: Secondary | ICD-10-CM | POA: Diagnosis not present

## 2023-05-15 DIAGNOSIS — I10 Essential (primary) hypertension: Secondary | ICD-10-CM | POA: Diagnosis not present

## 2023-05-15 DIAGNOSIS — R55 Syncope and collapse: Secondary | ICD-10-CM | POA: Diagnosis not present

## 2023-05-15 DIAGNOSIS — I4819 Other persistent atrial fibrillation: Secondary | ICD-10-CM | POA: Diagnosis not present

## 2023-05-15 DIAGNOSIS — I5022 Chronic systolic (congestive) heart failure: Secondary | ICD-10-CM | POA: Diagnosis not present

## 2023-05-15 DIAGNOSIS — Z515 Encounter for palliative care: Secondary | ICD-10-CM | POA: Diagnosis not present

## 2023-05-15 DIAGNOSIS — Z7189 Other specified counseling: Secondary | ICD-10-CM

## 2023-05-15 DIAGNOSIS — E440 Moderate protein-calorie malnutrition: Secondary | ICD-10-CM | POA: Insufficient documentation

## 2023-05-15 DIAGNOSIS — I25810 Atherosclerosis of coronary artery bypass graft(s) without angina pectoris: Secondary | ICD-10-CM | POA: Diagnosis not present

## 2023-05-15 LAB — CBC
HCT: 35.9 % — ABNORMAL LOW (ref 39.0–52.0)
Hemoglobin: 11.7 g/dL — ABNORMAL LOW (ref 13.0–17.0)
MCH: 33 pg (ref 26.0–34.0)
MCHC: 32.6 g/dL (ref 30.0–36.0)
MCV: 101.1 fL — ABNORMAL HIGH (ref 80.0–100.0)
Platelets: 157 10*3/uL (ref 150–400)
RBC: 3.55 MIL/uL — ABNORMAL LOW (ref 4.22–5.81)
RDW: 13.2 % (ref 11.5–15.5)
WBC: 5.7 10*3/uL (ref 4.0–10.5)
nRBC: 0 % (ref 0.0–0.2)

## 2023-05-15 LAB — COMPREHENSIVE METABOLIC PANEL
ALT: 31 U/L (ref 0–44)
AST: 39 U/L (ref 15–41)
Albumin: 3.2 g/dL — ABNORMAL LOW (ref 3.5–5.0)
Alkaline Phosphatase: 68 U/L (ref 38–126)
Anion gap: 8 (ref 5–15)
BUN: 13 mg/dL (ref 8–23)
CO2: 23 mmol/L (ref 22–32)
Calcium: 8.3 mg/dL — ABNORMAL LOW (ref 8.9–10.3)
Chloride: 103 mmol/L (ref 98–111)
Creatinine, Ser: 1.24 mg/dL (ref 0.61–1.24)
GFR, Estimated: >60 mL/min (ref >60)
Glucose, Bld: 76 mg/dL (ref 70–99)
Potassium: 3.9 mmol/L (ref 3.5–5.1)
Sodium: 134 mmol/L — ABNORMAL LOW (ref 135–145)
Total Bilirubin: 0.8 mg/dL (ref 0.3–1.2)
Total Protein: 5.9 g/dL — ABNORMAL LOW (ref 6.5–8.1)

## 2023-05-15 LAB — ECHOCARDIOGRAM COMPLETE
AR max vel: 2.06 cm2
AV Area VTI: 2.63 cm2
AV Area mean vel: 2.31 cm2
AV Mean grad: 1 mm[Hg]
AV Peak grad: 3.2 mm[Hg]
Ao pk vel: 0.89 m/s
Area-P 1/2: 3.56 cm2
Est EF: 50
Height: 66 in
S' Lateral: 3.9 cm
Weight: 2447.99 [oz_av]

## 2023-05-15 LAB — MAGNESIUM: Magnesium: 2 mg/dL (ref 1.7–2.4)

## 2023-05-15 LAB — PHOSPHORUS: Phosphorus: 2.4 mg/dL — ABNORMAL LOW (ref 2.5–4.6)

## 2023-05-15 MED ORDER — K PHOS MONO-SOD PHOS DI & MONO 155-852-130 MG PO TABS
250.0000 mg | ORAL_TABLET | Freq: Two times a day (BID) | ORAL | Status: DC
  Administered 2023-05-15: 250 mg via ORAL
  Filled 2023-05-15: qty 1

## 2023-05-15 MED ORDER — LOSARTAN POTASSIUM 50 MG PO TABS: 25.0000 mg | ORAL_TABLET | Freq: Every day | ORAL | Status: DC

## 2023-05-15 MED ORDER — LOSARTAN POTASSIUM 25 MG PO TABS: 25.0000 mg | ORAL_TABLET | Freq: Every day | ORAL | 0 refills | Status: DC

## 2023-05-15 MED ORDER — WARFARIN SODIUM 5 MG PO TABS
5.0000 mg | ORAL_TABLET | Freq: Once | ORAL | Status: AC
  Administered 2023-05-15: 5 mg via ORAL
  Filled 2023-05-15: qty 1

## 2023-05-15 MED ORDER — ADULT MULTIVITAMIN W/MINERALS CH
1.0000 | ORAL_TABLET | Freq: Every day | ORAL | Status: DC
  Administered 2023-05-15: 1 via ORAL
  Filled 2023-05-15: qty 1

## 2023-05-15 MED ORDER — WARFARIN - PHARMACIST DOSING INPATIENT: Freq: Every day | Status: DC

## 2023-05-15 NOTE — Progress Notes (Signed)
PHARMACY - ANTICOAGULATION CONSULT NOTE  Pharmacy Consult for warfarin Indication: atrial fibrillation  No Known Allergies  Patient Measurements: Height: 5\' 6"  (167.6 cm) Weight: 69.4 kg (153 lb) IBW/kg (Calculated) : 63.8 Heparin Dosing Weight:   Vital Signs: Temp: 97.9 F (36.6 C) (10/08 0532) Temp Source: Oral (10/08 0532) BP: 130/84 (10/08 0532) Pulse Rate: 62 (10/08 0532)  Labs: Recent Labs    05/14/23 1610 05/14/23 1806 05/15/23 0413  HGB 12.5*  --  11.7*  HCT 36.9*  --  35.9*  PLT 152  --  157  LABPROT 29.0*  --   --   INR 2.7*  --   --   CREATININE 1.19  --  1.24  TROPONINIHS 13 13  --     Estimated Creatinine Clearance: 46.4 mL/min (by C-G formula based on SCr of 1.24 mg/dL).   Medical History: Past Medical History:  Diagnosis Date   Anginal pain (HCC)    Atrial fibrillation by electrocardiogram (HCC) 11/2019   Atrial flutter by electrocardiogram [facility]) 11/2019   Bladder cancer (HCC) 01/2020   Bladder tumor    BPH (benign prostatic hyperplasia)    CAD (coronary artery disease)    a. s/p INF MI 1997;  b. s/p CABG 2001;  c. LHC 11/2009:  3v CAD, S-PDA ok with 40% mid, S-OM ok, S-Dx ok, L-LAD ok, EF 50%;  d.  Lex MV 5/14:  Inferolateral scar, EF 46%, no ischemia   COPD (chronic obstructive pulmonary disease) (HCC)    Dyspnea    ETOH abuse    GERD (gastroesophageal reflux disease)    History of gout    Hypercholesterolemia    Hypertension    Myocardial infarction [facility]) 2013   Prostate cancer (HCC)    Tobacco abuse     Medications:  Medications Prior to Admission  Medication Sig Dispense Refill Last Dose   allopurinol (ZYLOPRIM) 100 MG tablet Take 1 tablet (100 mg total) by mouth daily. 90 tablet 0 05/14/2023   atorvastatin (LIPITOR) 80 MG tablet Take 1 tablet (80 mg total) by mouth daily. 90 tablet 1 05/14/2023   B Complex Vitamins (VITAMIN B COMPLEX PO) Take 1 tablet by mouth daily.   05/14/2023   Budeson-Glycopyrrol-Formoterol (BREZTRI AEROSPHERE)  160-9-4.8 MCG/ACT AERO Take 2 puffs by mouth in the morning and at bedtime. Dispense 3 inhalers 32.1 g 5 05/15/2023   Cholecalciferol (VITAMIN D3) 125 MCG (5000 UT) CAPS Take 5,000 Units by mouth daily.   05/14/2023   clopidogrel (PLAVIX) 75 MG tablet Take 1 tablet (75 mg total) by mouth daily. Continue this for 1 year 90 tablet 1 05/14/2023   empagliflozin (JARDIANCE) 10 MG TABS tablet Take 1 tablet (10 mg total) by mouth daily before breakfast. 90 tablet 1 05/14/2023   fluticasone (FLONASE) 50 MCG/ACT nasal spray Place 2 sprays into both nostrils daily as needed for allergies. 48 g 2 05/14/2023   losartan (COZAAR) 50 MG tablet Take 1 tablet (50 mg total) by mouth daily. 30 tablet 5 05/14/2023   Magnesium Oxide 400 MG CAPS Take 1 capsule (400 mg total) by mouth 2 (two) times daily at 10 AM and 5 PM. 180 capsule 1 05/14/2023   metoprolol succinate (TOPROL XL) 50 MG 24 hr tablet Take 1.5 tablets (75 mg total) by mouth daily. 135 tablet 1 05/14/2023   spironolactone (ALDACTONE) 25 MG tablet Take 0.5 tablets (12.5 mg total) by mouth 2 (two) times daily. 90 tablet 1 05/15/2023   warfarin (COUMADIN) 5 MG tablet Take 5 mg  by mouth daily. Take 1/2 - to 1 tablet daily or as directed by coumadin clinic.   05/14/2023   acetaminophen (TYLENOL) 500 MG tablet Take 2 tablets (1,000 mg total) by mouth every 6 (six) hours as needed for moderate pain or headache. 90 tablet 0 Unknown   albuterol (VENTOLIN HFA) 108 (90 Base) MCG/ACT inhaler Inhale 2 puffs into the lungs every 6 (six) hours as needed for wheezing or shortness of breath. 3 each 3    furosemide (LASIX) 20 MG tablet Take 1 tablet (20 mg total) by mouth as needed (swelling). 90 tablet 0 Unknown   nitroGLYCERIN (NITROSTAT) 0.4 MG SL tablet Place 1 tablet (0.4 mg total) under the tongue every 5 (five) minutes as needed for chest pain. 75 tablet 2    pantoprazole (PROTONIX) 40 MG tablet TAKE 1 TABLET EVERY DAY 90 tablet 3 Unknown   Potassium & Sodium Phosphates  (PHOSPHORUS SUPPLEMENT) 280-160-250 MG PACK Take 1 packet by mouth 2 (two) times daily. In water or juice 180 each 1 Unknown    Assessment: Pharmacy consulted to dose warfarin in patient with atrial fibrillation.  Patient's INR on admission therapeutic at 2.7. Home dose listed as 5 mg on Tue + Sat and 2.5 mg ROW. Last dose 10/7.  CBC WNL  Goal of Therapy:  INR 2-3 Monitor platelets by anticoagulation protocol: Yes   Plan:  Warfarin 5 mg x 1 dose. Monitor daily INR and s/s of bleeding.  Judeth Cornfield, PharmD Clinical Pharmacist 05/15/2023 10:26 AM

## 2023-05-15 NOTE — TOC CM/SW Note (Signed)
Transition of Care [facility]) - Inpatient Brief Assessment   Patient Details  Name: [name] MRN: [id number] Date of Birth: 17-Feb-1946  Transition of Care [facility]) CM/SW Contact:    Villa Herb, LCSWA Phone Number: 05/15/2023, 1:04 PM   Clinical Narrative: Transition of Care Department [facility]) has reviewed patient and no TOC needs have been identified at this time. We will continue to monitor patient advancement through interdisciplinary progression rounds. If new patient transition needs arise, please place a TOC consult.  Transition of Care Asessment: Insurance and Status: Insurance coverage has been reviewed Patient has primary care physician: Yes Home environment has been reviewed: from home Prior level of function:: independent Prior/Current Home Services: No current home services Social Determinants of Health Reivew: SDOH reviewed no interventions necessary Readmission risk has been reviewed: Yes Transition of care needs: no transition of care needs at this time

## 2023-05-15 NOTE — Progress Notes (Signed)
Mobility Specialist Progress Note:    05/15/23 1100  Mobility  Activity Ambulated with assistance in hallway  Level of Assistance Contact guard assist, steadying assist  Assistive Device None  Distance Ambulated (ft) 70 ft  Range of Motion/Exercises Active;All extremities  Activity Response Tolerated well  Mobility Referral Yes  $Mobility charge 1 Mobility  Mobility Specialist Start Time (ACUTE ONLY) 1100  Mobility Specialist Stop Time (ACUTE ONLY) 1110  Mobility Specialist Time Calculation (min) (ACUTE ONLY) 10 min   Pt received in bed, family in room. Agreeable to mobility, required CGA to stand and ambulate with no AD. Tolerated well, pt denies any dizziness or lightheadedness. Returned pt to room, left supine. All needs met.   [name] Mobility Specialist Please contact via Special educational needs teacher or  Rehab office at [phone]

## 2023-05-15 NOTE — Care Management Obs Status (Signed)
MEDICARE OBSERVATION STATUS NOTIFICATION   Patient Details  Name: [name] MRN: [id number] Date of Birth: 1945-09-05   Medicare Observation Status Notification Given:  Yes    Corey Harold 05/15/2023, 3:28 PM

## 2023-05-15 NOTE — Consult Note (Signed)
Cardiology Consultation   [name] MRN: [id number]; DOB: 1945-12-02  Admit date: 05/14/2023 Date of Consult: 05/15/2023  PCP:  Mechele Claude, MD   [facility] Providers Cardiologist:  Dina Rich, MD        Patient Profile:   [name] is a 77 y.o. male with a hx of CAD (s/p CABG in 2001 with patent grafts by cath in 11/2009, cath in 03/2022 showing patent LIMA-LAD, patent SVG-D1, occluded SVG-RCA and RCA filled by collaterals and patent SVG-OM1 with 80% focal stenosis and treated with DES placement), paroxysmal atrial fibrillation, chronic HFmrEF (EF 40-45% by echo in 02/2022, 45-50% by echo in 06/2022), HTN, HLD, COPD and history of prostate cancer who is being seen 05/15/2023 for the evaluation of syncope at the request of Dr. Jonathon Bellows.  History of Present Illness:   [name] was evaluated by Sharlene Dory, NP in 03/2023 and reported having intermittent dyspnea on exertion and had been notified by Pulmonology that his COPD had progressed. He denied any associated chest pain or palpitations at that time. In regards to his cardiomyopathy, he was continued on Jardiance, Lasix, Losartan and Toprol-XL with Spironolactone being increased to 12.5mg  BID  The patient and his wife contacted the office on 04/30/2023 reporting palpitations for the past 3 days. A 2-week Zio patch was recommended for further evaluation. BP had also been above goal and Losartan was increased to 50 mg daily. He did have follow-up labs on 05/10/2023 and this showed that his creatinine had increased from 1.11 to 1.60.  He was encouraged to increase his hydration but when he was called with his results, his wife reported that he had 2 syncopal episodes earlier in the day and had been having a headache throughout the entire day. Therefore, it was recommend that he go to the ED for further evaluation.  He was hypotensive at 90/60 upon initial check by EMS and received 1500 cc with improvement to  145/95. He reported being in his normal state of health until that afternoon when he developed a headache and then went outside and had an episode of weakness when getting out of his truck. This persisted as he was walking around the vehicle and he had a syncopal episode with estimated LOC at 1 to 2 minutes. Denied any associated chest pain or palpitations at that time but had experienced palpitations in the past. His wife reports they have been checking his heart rate at home and the lowest readings are in the 50's but has been elevated at times and she is unable to recall specific numbers for this. He did wear his heart monitor and mailed this back yesterday after the episodes occurred.  Reports not drinking any water on a typical basis as he mostly consumes sodas.  Initial labs showed WBC 6.7, Hgb 12.5, platelets 152, Na+ 131, K+ 3.6 and creatinine 1.19.  INR 2.7.  BNP mildly elevated at 144. Initial and repeat troponin values negative at 13. CXR showed emphysema and bronchitic changes. CT Head showed no acute intracranial abnormalities. Was noted to have mild chronic small vessel disease. EKG showed rate-controlled atrial fibrillation, heart rate 65 with LVH.  He was admitted for further observation and received additional fluids which were discontinued last night. He has been continued on Toprol-XL 75 mg daily and Losartan 50 mg daily with Spironolactone and PRN Lasix currently being held.   Past Medical History:  Diagnosis Date   Anginal pain (HCC)    Atrial  fibrillation by electrocardiogram (HCC) 11/2019   Atrial flutter by electrocardiogram [facility]) 11/2019   Bladder cancer (HCC) 01/2020   Bladder tumor    BPH (benign prostatic hyperplasia)    CAD (coronary artery disease)    a. s/p INF MI 1997;  b. s/p CABG 2001;  c. LHC 11/2009:  3v CAD, S-PDA ok with 40% mid, S-OM ok, S-Dx ok, L-LAD ok, EF 50%;  d.  Lex MV 5/14:  Inferolateral scar, EF 46%, no ischemia   COPD (chronic obstructive pulmonary  disease) (HCC)    Dyspnea    ETOH abuse    GERD (gastroesophageal reflux disease)    History of gout    Hypercholesterolemia    Hypertension    Myocardial infarction [facility]) 2013   Prostate cancer (HCC)    Tobacco abuse     Past Surgical History:  Procedure Laterality Date   ANTERIOR CERVICAL DECOMPRESSION/DISCECTOMY FUSION 4 LEVELS N/A 07/21/2016   Procedure: ANTERIOR CERVICAL DECOMPRESSION/DISCECTOMY FUSION CERVICAL TWO-THREE, CERVICAL THREE-FOUR,. CERVICAL FOUR-FIVE, CERVICAL  FIVE-SIX;  Surgeon: Coletta Memos, MD;  Location: [facility] OR;  Service: Neurosurgery;  Laterality: N/A;   BACK SURGERY     CARDIAC CATHETERIZATION  2011   CARDIOVERSION N/A 06/10/2020   Procedure: CARDIOVERSION;  Surgeon: Antoine Poche, MD;  Location: [facility];  Service: Endoscopy;  Laterality: N/A;   COLONOSCOPY     in remote past, Dr. Jarold Motto. Obtaining records.    COLONOSCOPY N/A 11/21/2017   Surgeon: Corbin Ade, MD; Three 4-6 mm polyps in the rectum and splenic flexure resected and retrieved (tubular adenomas), nonbleeding internal hemorrhoids.  Repeat in 5 years.   COLONOSCOPY WITH PROPOFOL N/A 06/09/2021   Surgeon: Corbin Ade, MD;  Three 3-7 mm polyps in the ascending colon resected and retrieved, mild radiation proctitis, otherwise normal exam. Pathology revealed tubular adenomas.  Recommended repeat colonoscopy in 5 years.   CORONARY ANGIOPLASTY     CORONARY ARTERY BYPASS GRAFT     CORONARY STENT INTERVENTION N/A 03/27/2022   Procedure: CORONARY STENT INTERVENTION;  Surgeon: Swaziland, Peter M, MD;  Location: [facility] INVASIVE CV LAB;  Service: Cardiovascular;  Laterality: N/A;   ESOPHAGOGASTRODUODENOSCOPY  05/27/2012   ZOX:WRUEAV esophagus, stomach and duodenum s/p dilator   POLYPECTOMY  11/21/2017   Procedure: POLYPECTOMY;  Surgeon: Corbin Ade, MD;  Location: [facility];  Service: Endoscopy;;  colon   POLYPECTOMY  06/09/2021   Procedure: POLYPECTOMY;  Surgeon: Corbin Ade, MD;   Location: [facility];  Service: Endoscopy;;   PROSTATE SURGERY     RIGHT/LEFT HEART CATH AND CORONARY/GRAFT ANGIOGRAPHY N/A 03/27/2022   Procedure: RIGHT/LEFT HEART CATH AND CORONARY/GRAFT ANGIOGRAPHY;  Surgeon: Swaziland, Peter M, MD;  Location: [facility] INVASIVE CV LAB;  Service: Cardiovascular;  Laterality: N/A;   TRANSURETHRAL RESECTION OF BLADDER TUMOR WITH MITOMYCIN-C Left 01/06/2020   Procedure: CYSTOSCOPY TRANSURETHRAL RESECTION OF BLADDER TUMOR,LEFT URETERSCOPY AND GEMCITABINE;  Surgeon: Bjorn Pippin, MD;  Location: [facility] ORS;  Service: Urology;  Laterality: Left;     Home Medications:  Prior to Admission medications   Medication Sig Start Date End Date Taking? Authorizing Provider  allopurinol (ZYLOPRIM) 100 MG tablet Take 1 tablet (100 mg total) by mouth daily. 05/14/23  Yes Mechele Claude, MD  atorvastatin (LIPITOR) 80 MG tablet Take 1 tablet (80 mg total) by mouth daily. 03/20/23  Yes Sharlene Dory, NP  B Complex Vitamins (VITAMIN B COMPLEX PO) Take 1 tablet by mouth daily.   Yes [provider]  Budeson-Glycopyrrol-Formoterol (BREZTRI AEROSPHERE) 160-9-4.8  MCG/ACT AERO Take 2 puffs by mouth in the morning and at bedtime. Dispense 3 inhalers 12/06/21  Yes Stacks, Broadus John, MD  Cholecalciferol (VITAMIN D3) 125 MCG (5000 UT) CAPS Take 5,000 Units by mouth daily.   Yes [provider]  clopidogrel (PLAVIX) 75 MG tablet Take 1 tablet (75 mg total) by mouth daily. Continue this for 1 year 03/20/23  Yes Sharlene Dory, NP  empagliflozin (JARDIANCE) 10 MG TABS tablet Take 1 tablet (10 mg total) by mouth daily before breakfast. 03/20/23  Yes Sharlene Dory, NP  fluticasone [facility]) 50 MCG/ACT nasal spray Place 2 sprays into both nostrils daily as needed for allergies. 11/24/20  Yes Mechele Claude, MD  losartan (COZAAR) 50 MG tablet Take 1 tablet (50 mg total) by mouth daily. 04/30/23  Yes Sharlene Dory, NP  Magnesium Oxide 400 MG CAPS Take 1 capsule (400 mg total) by mouth 2 (two) times  daily at 10 AM and 5 PM. 04/10/23  Yes Sharlene Dory, NP  metoprolol succinate (TOPROL XL) 50 MG 24 hr tablet Take 1.5 tablets (75 mg total) by mouth daily. 03/20/23  Yes Sharlene Dory, NP  spironolactone (ALDACTONE) 25 MG tablet Take 0.5 tablets (12.5 mg total) by mouth 2 (two) times daily. 03/20/23  Yes Sharlene Dory, NP  warfarin (COUMADIN) 5 MG tablet Take 5 mg by mouth daily. Take 1/2 - to 1 tablet daily or as directed by coumadin clinic.   Yes [provider]  acetaminophen (TYLENOL) 500 MG tablet Take 2 tablets (1,000 mg total) by mouth every 6 (six) hours as needed for moderate pain or headache. 03/03/20   Mechele Claude, MD  albuterol (VENTOLIN HFA) 108 (90 Base) MCG/ACT inhaler Inhale 2 puffs into the lungs every 6 (six) hours as needed for wheezing or shortness of breath. 05/30/22   Mechele Claude, MD  furosemide (LASIX) 20 MG tablet Take 1 tablet (20 mg total) by mouth as needed (swelling). 03/20/23   Sharlene Dory, NP  nitroGLYCERIN (NITROSTAT) 0.4 MG SL tablet Place 1 tablet (0.4 mg total) under the tongue every 5 (five) minutes as needed for chest pain. 11/24/20   Mechele Claude, MD  pantoprazole (PROTONIX) 40 MG tablet TAKE 1 TABLET EVERY DAY 02/05/23   Mechele Claude, MD  Potassium & Sodium Phosphates (PHOSPHORUS SUPPLEMENT) 280-160-250 MG PACK Take 1 packet by mouth 2 (two) times daily. In water or juice 12/13/22   Mechele Claude, MD    Inpatient Medications: Scheduled Meds:  atorvastatin  80 mg Oral Daily   clopidogrel  75 mg Oral Daily   empagliflozin  10 mg Oral QAC breakfast   feeding supplement  237 mL Oral BID BM   [START ON 05/16/2023] losartan  25 mg Oral Daily   metoprolol succinate  75 mg Oral Daily   umeclidinium bromide  1 puff Inhalation Daily   And   mometasone-formoterol  2 puff Inhalation BID   pantoprazole  40 mg Oral Daily   phosphorus  250 mg Oral BID   warfarin  5 mg Oral ONCE-1600   Warfarin - Pharmacist Dosing Inpatient   Does not apply q1600    Continuous Infusions:  PRN Meds: acetaminophen **OR** acetaminophen, albuterol, ondansetron **OR** ondansetron (ZOFRAN) IV  Allergies:   No Known Allergies  Social History:   Social History   Socioeconomic History   Marital status: Married    Spouse name: Not on file   Number of children: 2   Years of education: Not on file   Highest education level: Not on  file  Occupational History    Comment: retired  Tobacco Use   Smoking status: Every Day    Current packs/day: 1.00    Average packs/day: 1 pack/day for 58.2 years (58.2 ttl pk-yrs)    Types: Cigarettes, E-cigarettes    Start date: 03/08/1970    Passive exposure: Never   Smokeless tobacco: Former    Types: Snuff    Quit date: 03/08/2012   Tobacco comments:    10 - 15 cigarettes smoked daily [facility] 01/19/23  Vaping Use   Vaping status: Former  Substance and Sexual Activity   Alcohol use: Yes    Alcohol/week: 2.0 - 4.0 standard drinks of alcohol    Types: 2 - 4 Cans of beer per week    Comment: 2-4 beers per day   Drug use: No   Sexual activity: Yes    Partners: Female  Other Topics Concern   Not on file  Social History Narrative   His brother lives with him and his wife. Daughter lives in [place], son lives nearby.     Family History:    Family History  Problem Relation Age of Onset   Diabetes Mother    Hypertension Father    Heart disease Brother    Diabetes Brother    Heart disease Brother    Diabetes Brother    Multiple sclerosis Daughter    Healthy Son    Colon cancer Neg Hx    Inflammatory bowel disease Neg Hx      ROS:  Please see the history of present illness.   All other ROS reviewed and negative.     Physical Exam/Data:   Vitals:   05/15/23 0153 05/15/23 0449 05/15/23 0532 05/15/23 0753  BP:   130/84   Pulse: 67  62   Resp: 20     Temp: (!) 97.3 F (36.3 C)  97.9 F (36.6 C)   TempSrc: Oral  Oral   SpO2: 99%   96%  Weight:  69.4 kg    Height:        Intake/Output Summary (Last 24  hours) at 05/15/2023 1144 Last data filed at 05/15/2023 0212 Gross per 24 hour  Intake --  Output 350 ml  Net -350 ml      05/15/2023    4:49 AM 05/14/2023    4:05 PM 03/20/2023   12:59 PM  Last 3 Weights  Weight (lbs) 153 lb 149 lb 149 lb 9.6 oz  Weight (kg) 69.4 kg 67.586 kg 67.858 kg     Body mass index is 24.69 kg/m.  General:  Well nourished, well developed male appearing in no acute distress. HEENT: normal Neck: no JVD Vascular: No carotid bruits; Distal pulses 2+ bilaterally Cardiac:  normal S1, S2; Irregularly irregular Lungs:  clear to auscultation bilaterally, no wheezing, rhonchi or rales  Abd: soft, nontender, no hepatomegaly  Ext: no pitting edema Musculoskeletal:  No deformities, BUE and BLE strength normal and equal Skin: warm and dry  Neuro:  CNs 2-12 intact, no focal abnormalities noted Psych:  Normal affect   EKG:  The EKG was personally reviewed and demonstrates: Rate-controlled atrial fibrillation, heart rate 65 with LVH.  Telemetry:  Telemetry was personally reviewed and demonstrates: Atrial fibrillation, HR in 60's to 70's. Occasionally into the 110's. No significant pauses.   Relevant CV Studies:  R/LHC: 03/2022   Ost LAD to Prox LAD lesion is 100% stenosed.   Mid LAD lesion is 100% stenosed.   Prox Cx lesion is 100%  stenosed.   Prox RCA to Mid RCA lesion is 80% stenosed.   Mid RCA lesion is 100% stenosed.   Origin to Prox Graft lesion is 80% stenosed.   Origin to Prox Graft lesion is 100% stenosed.   A drug-eluting stent was successfully placed using a SYNERGY XD 4.0X12.   Post intervention, there is a 0% residual stenosis.   LIMA graft was visualized by angiography and is normal in caliber.   SVG graft was visualized by angiography and is normal in caliber.   SVG graft was visualized by angiography and is normal in caliber.   SVG graft was visualized by angiography.   The graft exhibits no disease.   The graft exhibits no disease.   LV end  diastolic pressure is mildly elevated.   Hemodynamic findings consistent with mild pulmonary hypertension.   3 vessel occlusive CAD Patent LIMA to the LAD Patent SVG to the first diagonal Patent SVG to OM1 with 80% focal stenosis in the proximal SVG Occluded SVG to RCA. The RCA fills by left to right collaterals.  Mildly elevated LV filling pressures.  Mild pulmonary HTN.  Normal cardiac output 4.51 L/min with index 2.53.  Successful PCI of the SVG to OM1 with DES   Plan: DAPT with ASA for one month and Plavix for 12 months. Can resume oral anticoagulation in am if no bleeding noted.   Echocardiogram: 06/2022 IMPRESSIONS     1. Left ventricular ejection fraction, by estimation, is 45 to 50%. The  left ventricle has mildly decreased function. The left ventricle  demonstrates regional wall motion abnormalities (see scoring  diagram/findings for description). There is mild  asymmetric left ventricular hypertrophy of the basal segment. Left  ventricular diastolic parameters are indeterminate.   2. Right ventricular systolic function is normal. The right ventricular  size is normal. There is normal pulmonary artery systolic pressure. The  estimated right ventricular systolic pressure is 24.3 mmHg.   3. Left atrial size was mild to moderately dilated.   4. The mitral valve is grossly normal. Mild to moderate mitral valve  regurgitation.   5. The aortic valve is tricuspid. Aortic valve regurgitation is not  visualized.   6. The inferior vena cava is normal in size with greater than 50%  respiratory variability, suggesting right atrial pressure of 3 mmHg.   Comparison(s): Prior images reviewed side by side. LVEF improved somewhat  at 45-50% with wall motion abnormalities consistent with ischemic  cardiomyopathy.   Laboratory Data:  High Sensitivity Troponin:   Recent Labs  Lab 05/14/23 1610 05/14/23 1806  TROPONINIHS 13 13     Chemistry Recent Labs  Lab 05/10/23 1253  05/14/23 1610 05/15/23 0413  NA 136 131* 134*  K 4.2 3.6 3.9  CL 98 102 103  CO2 23 22 23   GLUCOSE 112* 80 76  BUN 19 12 13   CREATININE 1.60* 1.19 1.24  CALCIUM 9.9 7.9* 8.3*  MG  --  1.6* 2.0  GFRNONAA  --  >60 >60  ANIONGAP  --  7 8    Recent Labs  Lab 05/14/23 1610 05/15/23 0413  PROT 6.1* 5.9*  ALBUMIN 3.3* 3.2*  AST 60* 39  ALT 33 31  ALKPHOS 68 68  BILITOT 0.4 0.8   Lipids No results for input(s): "CHOL", "TRIG", "HDL", "LABVLDL", "LDLCALC", "CHOLHDL" in the last 168 hours.  Hematology Recent Labs  Lab 05/14/23 1610 05/15/23 0413  WBC 6.7 5.7  RBC 3.66* 3.55*  HGB 12.5* 11.7*  HCT 36.9*  35.9*  MCV 100.8* 101.1*  MCH 34.2* 33.0  MCHC 33.9 32.6  RDW 13.3 13.2  PLT 152 157   Thyroid No results for input(s): "TSH", "FREET4" in the last 168 hours.  BNP Recent Labs  Lab 05/14/23 1610  BNP 144.0*    DDimer No results for input(s): "DDIMER" in the last 168 hours.   Radiology/Studies:  DG Chest Portable 1 View  Result Date: 05/14/2023 CLINICAL DATA:  Syncope EXAM: PORTABLE CHEST 1 VIEW COMPARISON:  12/07/2022 FINDINGS: Hardware in the cervical spine. Post sternotomy changes. Emphysema and bronchitic changes. No consolidation, pleural effusion or pneumothorax. Stable cardiomediastinal silhouette IMPRESSION: Emphysema and bronchitic changes. Electronically Signed   By: Jasmine Pang M.D.   On: 05/14/2023 20:50   CT Head Wo Contrast  Result Date: 05/14/2023 CLINICAL DATA:  Syncope/presyncope, cerebrovascular cause suspected. EXAM: CT HEAD WITHOUT CONTRAST TECHNIQUE: Contiguous axial images were obtained from the base of the skull through the vertex without intravenous contrast. RADIATION DOSE REDUCTION: This exam was performed according to the departmental dose-optimization program which includes automated exposure control, adjustment of the mA and/or kV according to patient size and/or use of iterative reconstruction technique. COMPARISON:  None Available.  FINDINGS: Brain: No acute hemorrhage. Cortical gray-white differentiation is preserved. Patchy hypoattenuation of the cerebral white matter, most consistent with mild chronic small-vessel disease. Prominence of the ventricles and sulci within normal limits for age. No extra-axial collection. Basilar cisterns are patent. Vascular: No hyperdense vessel or unexpected calcification. Skull: No calvarial fracture or suspicious bone lesion. Skull base is unremarkable. Sinuses/Orbits: No acute finding. Other: None. IMPRESSION: 1. No acute intracranial abnormality. 2. Mild chronic small-vessel disease. Electronically Signed   By: Orvan Falconer M.D.   On: 05/14/2023 18:54     Assessment and Plan:   1. Syncope - Likely due to dehydration as the episode occurred in the setting of being outside in the heat and he reports consuming minimal fluids. Telemetry has been unrevealing thus far showing rate controlled atrial fibrillation with no significant pauses. He was wearing a monitor yesterday when the episode occurred and we will have this expedited so results can be reviewed as an outpatient. - A repeat echocardiogram was already ordered by the admitting team and we can follow-up on results once available. Would check orthostatics today. Losartan was just recently titrated to 50 mg daily would reduce this to his prior dose of 25 mg daily.   2. CAD - He is s/p CABG in 2001 and most recent cath in 03/2022 showed patent LIMA-LAD, patent SVG-D1, occluded SVG-RCA and RCA filled by collaterals and patent SVG-OM1 with 80% focal stenosis and treated with DES placement.  - He denies any recent anginal symptoms. Continue current medical therapy with Atorvastatin 80 mg daily and Toprol-XL 75 mg daily. It was recommended at the time of his intervention in 03/2022 that he should be on Plavix for 12 months. Can likely stop at this time and will review with MD.  3. Persistent Atrial Fibrillation - Rates have overall been  well-controlled this admission and he has been continued on Toprol-XL 75 mg daily.  Would not anticipate dose changes at this time. Can follow-up on monitor results once available and we will have this expedited. - He remains on Coumadin for anticoagulation with no reports of active bleeding. Dosing is being managed by pharmacy.  4. Chronic HFmrEF  - His EF was at 45-50% by echo in 06/2022. While BNP was mildly elevated at 144 upon admission, he appears euvolemic  by examination today. Repeat echocardiogram was ordered by the admitting team and is pending at this time. Continue Jardiance 10 mg daily and Toprol-XL 75 mg daily. Will reduce Losartan to his prior dose of 25 mg daily. Would not switch to Entresto at this time given his variable BP but can readdress in the future. Can resume Spironolactone at 12.5 mg daily at the time of discharge  5. HTN - He was hypotensive upon arrival but BP has improved with administration of IV fluids. BP at 130/84 on most recent check. He has been continued on Losartan 50 mg daily and Toprol-XL 75 mg daily. Will reduce Losartan to his prior dose of 25 mg daily.  6. COPD - Followed by [facility] as an outpatient.    Risk Assessment/Risk Scores:    CHA2DS2-VASc Score = 5   This indicates a 7.2% annual risk of stroke. The patient's score is based upon: CHF History: 1 HTN History: 1 Diabetes History: 0 Stroke History: 0 Vascular Disease History: 1 Age Score: 2 Gender Score: 0  For questions or updates, please contact [facility] Please consult [email or web address] for contact info under    Signed, Ellsworth Lennox, PA-C  05/15/2023 11:44 AM

## 2023-05-15 NOTE — Plan of Care (Signed)
Pt progressing

## 2023-05-15 NOTE — Consult Note (Signed)
Consultation Note Date: 05/15/2023   Patient Name: [name]  DOB: 13-Aug-1945  MRN: [id number]  Age / Sex: 77 y.o., male  PCP: Mechele Claude, MD Referring Physician: Lanae Boast, MD  Reason for Consultation: Establishing goals of care  HPI/Patient Profile: 77 y.o. male  with past medical history of chronic HFrEF, CAD status post CABG, persistent A-fib on warfarin, HTN, COPD, GERD, tobacco/alcohol use, history of bladder cancer in 2021, admitted on 05/14/2023 with syncope likely due to dehydration, CAD.   Clinical Assessment and Goals of Care: I have reviewed medical records including EPIC notes, labs and imaging, received report from RN, assessed the patient.  [name] is moving freely around the room.  He greets me, making and somewhat keeping eye contact.  He is alert and oriented, able to make his needs known.  His wife of 18 years, Darel Hong, is present at bedside.   We meet at the bedside to discuss diagnosis prognosis, GOC, EOL wishes, disposition and options.  I introduced Palliative Medicine as specialized medical care for people living with serious illness. It focuses on providing relief from the symptoms and stress of a serious illness. The goal is to improve quality of life for both the patient and the family.  We discussed a brief life review of the patient.  Mr. and Mrs. Defauw have been married for 18 years.  He has 2 children a boy and a girl and 2 stepchildren.  He has been independent with ADLs and had been doing some yard work outside the home.  He no longer drives.   We focused on their current illness.  Overall, [name] and his wife seem knowledgeable about what brought him to the hospital, the treatment plan, follow-ups.  We talked about dehydration and p.o. intake.  We talked about finding a balance between fluid overload on the heart and keeping the kidneys wet.  Again, Mr. and Mrs. Running are  able to accurately repeat the plan.  The natural disease trajectory and expectations at EOL were discussed.  Advanced directives, concepts specific to code status, artifical feeding and hydration, and rehospitalization were considered and discussed.  [name] readily endorses DNR.  His wife agrees.  Discussed the importance of continued conversation with family and the medical providers regarding overall plan of care and treatment options, ensuring decisions are within the context of the patient's values and GOCs. Questions and concerns were addressed.  The patient and family were encouraged to call with questions or concerns.  PMT will continue to support holistically.  Conference with attending, bedside nursing staff, transition of care team related to patient condition, needs, goals of care, disposition.   HCPOA NEXT OF KIN -wife of 18 years, Demauri Kawada    SUMMARY OF RECOMMENDATIONS   Continue to treat the treatable but no CPR or intubation Follow up outpatient No needs at this time.   Code Status/Advance Care Planning: DNR  Symptom Management:  Per hospitalist, no additional needs at this time.  Palliative Prophylaxis:  Per hospitalist, no  additional needs at this time.  Additional Recommendations (Limitations, Scope, Preferences): Continue to treat but no CPR or intubation  Psycho-social/Spiritual:  Desire for further Chaplaincy support:no Additional Recommendations: Caregiving  Support/Resources  Prognosis:  Unable to determine -based on outcomes.  1 year or more would not be surprising  Discharge Planning: Anticipate Home, no services needed      Primary Diagnoses: Present on Admission:  Syncope  Chronic HFrEF (heart failure with reduced ejection fraction) (HCC)  Persistent atrial fibrillation (HCC)  COPD (chronic obstructive pulmonary disease) (HCC)  Mixed hyperlipidemia  Essential hypertension  CAD (coronary artery disease)  GERD (gastroesophageal reflux  disease)  Tobacco abuse   I have reviewed the medical record, interviewed the patient and family, and examined the patient. The following aspects are pertinent.  Past Medical History:  Diagnosis Date   Anginal pain (HCC)    Atrial fibrillation by electrocardiogram (HCC) 11/2019   Atrial flutter by electrocardiogram [facility]) 11/2019   Bladder cancer (HCC) 01/2020   Bladder tumor    BPH (benign prostatic hyperplasia)    CAD (coronary artery disease)    a. s/p INF MI 1997;  b. s/p CABG 2001;  c. LHC 11/2009:  3v CAD, S-PDA ok with 40% mid, S-OM ok, S-Dx ok, L-LAD ok, EF 50%;  d.  Lex MV 5/14:  Inferolateral scar, EF 46%, no ischemia   COPD (chronic obstructive pulmonary disease) (HCC)    Dyspnea    ETOH abuse    GERD (gastroesophageal reflux disease)    History of gout    Hypercholesterolemia    Hypertension    Myocardial infarction [facility]) 2013   Prostate cancer (HCC)    Tobacco abuse    Social History   Socioeconomic History   Marital status: Married    Spouse name: Not on file   Number of children: 2   Years of education: Not on file   Highest education level: Not on file  Occupational History    Comment: retired  Tobacco Use   Smoking status: Every Day    Current packs/day: 1.00    Average packs/day: 1 pack/day for 58.2 years (58.2 ttl pk-yrs)    Types: Cigarettes, E-cigarettes    Start date: 03/08/1970    Passive exposure: Never   Smokeless tobacco: Former    Types: Snuff    Quit date: 03/08/2012   Tobacco comments:    10 - 15 cigarettes smoked daily [facility] 01/19/23  Vaping Use   Vaping status: Former  Substance and Sexual Activity   Alcohol use: Yes    Alcohol/week: 2.0 - 4.0 standard drinks of alcohol    Types: 2 - 4 Cans of beer per week    Comment: 2-4 beers per day   Drug use: No   Sexual activity: Yes    Partners: Female  Other Topics Concern   Not on file  Social History Narrative   His brother lives with him and his wife. Daughter lives in [place], son lives  nearby.   Social Determinants of Health   Financial Resource Strain: Low Risk  (11/30/2022)   Overall Financial Resource Strain (CARDIA)    Difficulty of Paying Living Expenses: Not hard at all  Food Insecurity: Patient Declined (05/15/2023)   Hunger Vital Sign    Worried About Running Out of Food in the Last Year: Patient declined    Ran Out of Food in the Last Year: Patient declined  Transportation Needs: Patient Declined (05/15/2023)   PRAPARE - Transportation    Lack  of Transportation (Medical): Patient declined    Lack of Transportation (Non-Medical): Patient declined  Physical Activity: Sufficiently Active (11/30/2022)   Exercise Vital Sign    Days of Exercise per Week: 3 days    Minutes of Exercise per Session: 60 min  Stress: No Stress Concern Present (11/30/2022)   [employer] of Occupational Health - Occupational Stress Questionnaire    Feeling of Stress : Not at all  Social Connections: Socially Integrated (11/30/2022)   Social Connection and Isolation Panel [NHANES]    Frequency of Communication with Friends and Family: More than three times a week    Frequency of Social Gatherings with Friends and Family: More than three times a week    Attends Religious Services: More than 4 times per year    Active Member of [employer] or Organizations: Yes    Attends Engineer, structural: More than 4 times per year    Marital Status: Married   Family History  Problem Relation Age of Onset   Diabetes Mother    Hypertension Father    Heart disease Brother    Diabetes Brother    Heart disease Brother    Diabetes Brother    Multiple sclerosis Daughter    Healthy Son    Colon cancer Neg Hx    Inflammatory bowel disease Neg Hx    Scheduled Meds:  atorvastatin  80 mg Oral Daily   clopidogrel  75 mg Oral Daily   empagliflozin  10 mg Oral QAC breakfast   feeding supplement  237 mL Oral BID BM   [START ON 05/16/2023] losartan  25 mg Oral Daily   metoprolol succinate  75 mg  Oral Daily   umeclidinium bromide  1 puff Inhalation Daily   And   mometasone-formoterol  2 puff Inhalation BID   multivitamin with minerals  1 tablet Oral Daily   pantoprazole  40 mg Oral Daily   phosphorus  250 mg Oral BID   warfarin  5 mg Oral ONCE-1600   Warfarin - Pharmacist Dosing Inpatient   Does not apply q1600   Continuous Infusions: PRN Meds:.acetaminophen **OR** acetaminophen, albuterol, ondansetron **OR** ondansetron (ZOFRAN) IV Medications Prior to Admission:  Prior to Admission medications   Medication Sig Start Date End Date Taking? Authorizing Provider  allopurinol (ZYLOPRIM) 100 MG tablet Take 1 tablet (100 mg total) by mouth daily. 05/14/23  Yes Mechele Claude, MD  atorvastatin (LIPITOR) 80 MG tablet Take 1 tablet (80 mg total) by mouth daily. 03/20/23  Yes Sharlene Dory, NP  B Complex Vitamins (VITAMIN B COMPLEX PO) Take 1 tablet by mouth daily.   Yes [provider]  Budeson-Glycopyrrol-Formoterol (BREZTRI AEROSPHERE) 160-9-4.8 MCG/ACT AERO Take 2 puffs by mouth in the morning and at bedtime. Dispense 3 inhalers 12/06/21  Yes Stacks, Broadus John, MD  Cholecalciferol (VITAMIN D3) 125 MCG (5000 UT) CAPS Take 5,000 Units by mouth daily.   Yes [provider]  clopidogrel (PLAVIX) 75 MG tablet Take 1 tablet (75 mg total) by mouth daily. Continue this for 1 year 03/20/23  Yes Sharlene Dory, NP  empagliflozin (JARDIANCE) 10 MG TABS tablet Take 1 tablet (10 mg total) by mouth daily before breakfast. 03/20/23  Yes Sharlene Dory, NP  fluticasone [facility]) 50 MCG/ACT nasal spray Place 2 sprays into both nostrils daily as needed for allergies. 11/24/20  Yes Mechele Claude, MD  losartan (COZAAR) 50 MG tablet Take 1 tablet (50 mg total) by mouth daily. 04/30/23  Yes Sharlene Dory, NP  Magnesium Oxide 400 MG  CAPS Take 1 capsule (400 mg total) by mouth 2 (two) times daily at 10 AM and 5 PM. 04/10/23  Yes Sharlene Dory, NP  metoprolol succinate (TOPROL XL) 50 MG 24 hr tablet  Take 1.5 tablets (75 mg total) by mouth daily. 03/20/23  Yes Sharlene Dory, NP  spironolactone (ALDACTONE) 25 MG tablet Take 0.5 tablets (12.5 mg total) by mouth 2 (two) times daily. 03/20/23  Yes Sharlene Dory, NP  warfarin (COUMADIN) 5 MG tablet Take 5 mg by mouth daily. Take 1/2 - to 1 tablet daily or as directed by coumadin clinic.   Yes [provider]  acetaminophen (TYLENOL) 500 MG tablet Take 2 tablets (1,000 mg total) by mouth every 6 (six) hours as needed for moderate pain or headache. 03/03/20   Mechele Claude, MD  albuterol (VENTOLIN HFA) 108 (90 Base) MCG/ACT inhaler Inhale 2 puffs into the lungs every 6 (six) hours as needed for wheezing or shortness of breath. 05/30/22   Mechele Claude, MD  furosemide (LASIX) 20 MG tablet Take 1 tablet (20 mg total) by mouth as needed (swelling). 03/20/23   Sharlene Dory, NP  nitroGLYCERIN (NITROSTAT) 0.4 MG SL tablet Place 1 tablet (0.4 mg total) under the tongue every 5 (five) minutes as needed for chest pain. 11/24/20   Mechele Claude, MD  pantoprazole (PROTONIX) 40 MG tablet TAKE 1 TABLET EVERY DAY 02/05/23   Mechele Claude, MD  Potassium & Sodium Phosphates (PHOSPHORUS SUPPLEMENT) 280-160-250 MG PACK Take 1 packet by mouth 2 (two) times daily. In water or juice 12/13/22   Mechele Claude, MD   No Known Allergies Review of Systems  Unable to perform ROS: Other    Physical Exam Vitals and nursing note reviewed.  Constitutional:      General: He is not in acute distress.    Appearance: He is not ill-appearing.  HENT:     Mouth/Throat:     Mouth: Mucous membranes are moist.  Cardiovascular:     Rate and Rhythm: Normal rate.  Pulmonary:     Effort: Pulmonary effort is normal. No respiratory distress.  Neurological:     Mental Status: He is alert and oriented to person, place, and time.  Psychiatric:        Mood and Affect: Mood normal.        Behavior: Behavior normal.     Vital Signs: BP 130/84 (BP Location: Right Arm)   Pulse  62   Temp 97.9 F (36.6 C) (Oral)   Resp 20   Ht 5\' 6"  (1.676 m)   Wt 69.4 kg   SpO2 96%   BMI 24.69 kg/m  Pain Scale: 0-10   Pain Score: 0-No pain   SpO2: SpO2: 96 % O2 Device:SpO2: 96 % O2 Flow Rate: .O2 Flow Rate (L/min): 0 L/min  IO: Intake/output summary:  Intake/Output Summary (Last 24 hours) at 05/15/2023 1315 Last data filed at 05/15/2023 1206 Gross per 24 hour  Intake --  Output 825 ml  Net -825 ml    LBM: Last BM Date : 05/14/23 Baseline Weight: Weight: 67.6 kg Most recent weight: Weight: 69.4 kg     Palliative Assessment/Data:     Time In: 0930 Time Out: 1025 Time Total: 55 minute  Greater than 50%  of this time was spent counseling and coordinating care related to the above assessment and plan.  Signed by: Katheran Awe, NP   Please contact Palliative Medicine Team phone at [phone] for questions and concerns.  For individual provider: See Loretha Stapler

## 2023-05-15 NOTE — Hospital Course (Addendum)
77 y.o.m w/ history of  HTN,persistent atrial fibrillation on warfarin, chronic HFrEF, CAD s/p CABG, COPD, GERD, tobacco use disorder, alcohol use who presented to ED due to transitory loss of consciousness.  Patient states that he fell to the ground while getting out of his truck around 2 PM 05/14/23. he complained of sudden acute onset generalized weakness and he did not think that he lost consciousness, he was able to get up w/o assist.  While walking in front of the truck, he fell to the ground again, but this time he lost consciousness for about 1 to 2 minutes and required 2 person assist to get into the house.Apparently, patient has been wearing his Zio patch for few weeks.  Shortly after being inside the house, cardiology office called to notify him that he had an acute kidney injury from recent labs done and was advised to hydrate himself.  While still on the phone with the cardiology office, wife, who witnessed both falls told the office about the syncopal episodes and was advised to activate EMS.On arrival of EMS team, patient was noted to be hypotensive and 1.5 L of IV fluid was provided PTA with improvement in blood pressure.   ED Course:  VS-bradycardia, but otherwise stable blood pressure,  labs:hemoglobin 12.5, sodium of 131.  Magnesium 1.6 albumin 3.3, AST 60, ALT 33, BNP 144, urinalysis was normal. CT head without contrast>>no acute intracranial abnormality. CXR> emphysema with bronchitic changes. Patient is admitted for further management Seen by PT OT did well ambulating 70 feet in hallway. Carotid ultrasound showed less than 50% stenosis, echocardiogram obtained-shows EF 50%, has WMA, right ventricle systolic function is normal mitral valve is normal aortic valve is not well-visualized and no significant change from prior study.He will be discharged home cleared by cardiology

## 2023-05-15 NOTE — Progress Notes (Signed)
Ng Discharge Note  Admit Date:  05/14/2023 Discharge date: 05/15/2023   ENRRIQUE MIERZWA to be D/C'd Home per MD order.  AVS completed. Patient/caregiver able to verbalize understanding.  Discharge Medication: Allergies as of 05/15/2023   No Known Allergies      Medication List     STOP taking these medications    amLODipine 5 MG tablet Commonly known as: NORVASC   clopidogrel 75 MG tablet Commonly known as: PLAVIX   ibuprofen 200 MG tablet Commonly known as: ADVIL       TAKE these medications    acetaminophen 500 MG tablet Commonly known as: TYLENOL Take 2 tablets (1,000 mg total) by mouth every 6 (six) hours as needed for moderate pain or headache.   albuterol 108 (90 Base) MCG/ACT inhaler Commonly known as: Ventolin HFA Inhale 2 puffs into the lungs every 6 (six) hours as needed for wheezing or shortness of breath.   allopurinol 100 MG tablet Commonly known as: ZYLOPRIM Take 1 tablet (100 mg total) by mouth daily.   atorvastatin 80 MG tablet Commonly known as: LIPITOR Take 1 tablet (80 mg total) by mouth daily.   Breztri Aerosphere 160-9-4.8 MCG/ACT Aero Generic drug: Budeson-Glycopyrrol-Formoterol Take 2 puffs by mouth in the morning and at bedtime. Dispense 3 inhalers   empagliflozin 10 MG Tabs tablet Commonly known as: Jardiance Take 1 tablet (10 mg total) by mouth daily before breakfast.   fluticasone 50 MCG/ACT nasal spray Commonly known as: FLONASE Place 2 sprays into both nostrils daily as needed for allergies.   furosemide 20 MG tablet Commonly known as: LASIX Take 1 tablet (20 mg total) by mouth as needed (swelling).   losartan 25 MG tablet Commonly known as: COZAAR Take 1 tablet (25 mg total) by mouth daily. Start taking on: May 16, 2023 What changed:  medication strength how much to take   magnesium oxide 400 (240 Mg) MG tablet Commonly known as: MAG-OX Take 1 tablet by mouth daily.   metoprolol succinate 50 MG 24 hr  tablet Commonly known as: Toprol XL Take 1.5 tablets (75 mg total) by mouth daily.   nitroGLYCERIN 0.4 MG SL tablet Commonly known as: NITROSTAT Place 1 tablet (0.4 mg total) under the tongue every 5 (five) minutes as needed for chest pain.   pantoprazole 40 MG tablet Commonly known as: PROTONIX TAKE 1 TABLET EVERY DAY   Phosphorus Supplement 280-160-250 MG Pack Take 1 packet by mouth 2 (two) times daily. In water or juice   spironolactone 25 MG tablet Commonly known as: Aldactone Take 0.5 tablets (12.5 mg total) by mouth 2 (two) times daily.   VITAMIN B COMPLEX PO Take 1 tablet by mouth daily.   Vitamin D3 125 MCG (5000 UT) Caps Take 5,000 Units by mouth daily.   warfarin 5 MG tablet Commonly known as: COUMADIN Take as directed. If you are unsure how to take this medication, talk to your nurse or doctor. Original instructions: Take 5 mg by mouth daily. Take 1/2 - to 1 tablet daily or as directed by coumadin clinic. Tuesday and Saturday whole tab        Discharge Assessment: Vitals:   05/15/23 0753 05/15/23 1445  BP:  (!) 151/80  Pulse:  63  Resp:  14  Temp:    SpO2: 96% 100%   Skin clean, dry and intact without evidence of skin break down, no evidence of skin tears noted. IV catheter discontinued intact. Site without signs and symptoms of complications - no redness  or edema noted at insertion site, patient denies c/o pain - only slight tenderness at site.  Dressing with slight pressure applied.  D/c Instructions-Education: Discharge instructions given to patient/family with verbalized understanding. D/c education completed with patient/family including follow up instructions, medication list, d/c activities limitations if indicated, with other d/c instructions as indicated by MD - patient able to verbalize understanding, all questions fully answered. Patient instructed to return to ED, call 911, or call MD for any changes in condition.  Patient escorted via WC, and  D/C home via private auto.  Cristal Ford, LPN 16/08/958 4:54 PM

## 2023-05-15 NOTE — Discharge Summary (Signed)
Physician Discharge Summary  [name] ZOX:[id number] DOB: [birth year] DOA: 05/14/2023  PCP: Mechele Claude, MD  Admit date: 05/14/2023 Discharge date: 05/15/2023 Recommendations for Outpatient Follow-up:  Follow up with PCP in 1 weeks-call for appointment Please obtain BMP/CBC in one week F/u with cardiology as OP.  Discharge Dispo: Home Discharge Condition: Stable Code Status:   Code Status: Limited: Do not attempt resuscitation (DNR) -DNR-LIMITED -Do Not Intubate/DNI  Diet recommendation:  Diet Order             Diet Heart Room service appropriate? Yes; Fluid consistency: Thin  Diet effective now                  Brief/Interim Summary: 77 y.o.m w/ history of  HTN,persistent atrial fibrillation on warfarin, chronic HFrEF, CAD s/p CABG, COPD, GERD, tobacco use disorder, alcohol use who presented to ED due to transitory loss of consciousness.  Patient states that he fell to the ground while getting out of his truck around 2 PM 05/14/23. he complained of sudden acute onset generalized weakness and he did not think that he lost consciousness, he was able to get up w/o assist.  While walking in front of the truck, he fell to the ground again, but this time he lost consciousness for about 1 to 2 minutes and required 2 person assist to get into the house.Apparently, patient has been wearing his Zio patch for few weeks.  Shortly after being inside the house, cardiology office called to notify him that he had an acute kidney injury from recent labs done and was advised to hydrate himself.  While still on the phone with the cardiology office, wife, who witnessed both falls told the office about the syncopal episodes and was advised to activate EMS.On arrival of EMS team, patient was noted to be hypotensive and 1.5 L of IV fluid was provided PTA with improvement in blood pressure.   ED Course:  VS-bradycardia, but otherwise stable blood pressure,  labs:hemoglobin 12.5, sodium of 131.  Magnesium 1.6  albumin 3.3, AST 60, ALT 33, BNP 144, urinalysis was normal. CT head without contrast>>no acute intracranial abnormality. CXR> emphysema with bronchitic changes. Patient is admitted for further management Seen by PT OT did well ambulating 70 feet in hallway. Carotid ultrasound showed less than 50% stenosis, echocardiogram obtained-shows EF 50%, has WMA, right ventricle systolic function is normal mitral valve is normal aortic valve is not well-visualized and no significant change from prior study.He will be discharged home cleared by cardiology   Discharge Diagnoses:  Principal Problem:   Syncope Active Problems:   Mixed hyperlipidemia   Tobacco abuse   GERD (gastroesophageal reflux disease)   CAD (coronary artery disease)   Essential hypertension   COPD (chronic obstructive pulmonary disease) (HCC)   Persistent atrial fibrillation (HCC)   Chronic HFrEF (heart failure with reduced ejection fraction) (HCC)   Hypomagnesemia   Hypoalbuminemia due to protein-calorie malnutrition (HCC)   Elevated brain natriuretic peptide (BNP) level   Malnutrition of moderate degree  Syncope:Likely in the setting of hypotensive as noted by EMS and was given 1.5 L crystalloid in the field.  Rule out other etiology given his significant cardiac history, interestingly he has a Zio patch on- WILL ask cardiology to review.  Troponin flat EKG A-fib, vitals appears stable.  Renal function stable although had AKI on recent outpatient lab on 10/3. TTE 06/2022 showed LVEF of 45 to 50%.Positive RWMA.  Mild asymmetric LVH. unremakable carotid Doppler.  Orthostatic vitals are negative.  Physician Discharge Summary  [name] ZOX:[id number] DOB: [birth year] DOA: 05/14/2023  PCP: Mechele Claude, MD  Admit date: 05/14/2023 Discharge date: 05/15/2023 Recommendations for Outpatient Follow-up:  Follow up with PCP in 1 weeks-call for appointment Please obtain BMP/CBC in one week F/u with cardiology as OP.  Discharge Dispo: Home Discharge Condition: Stable Code Status:   Code Status: Limited: Do not attempt resuscitation (DNR) -DNR-LIMITED -Do Not Intubate/DNI  Diet recommendation:  Diet Order             Diet Heart Room service appropriate? Yes; Fluid consistency: Thin  Diet effective now                  Brief/Interim Summary: 77 y.o.m w/ history of  HTN,persistent atrial fibrillation on warfarin, chronic HFrEF, CAD s/p CABG, COPD, GERD, tobacco use disorder, alcohol use who presented to ED due to transitory loss of consciousness.  Patient states that he fell to the ground while getting out of his truck around 2 PM 05/14/23. he complained of sudden acute onset generalized weakness and he did not think that he lost consciousness, he was able to get up w/o assist.  While walking in front of the truck, he fell to the ground again, but this time he lost consciousness for about 1 to 2 minutes and required 2 person assist to get into the house.Apparently, patient has been wearing his Zio patch for few weeks.  Shortly after being inside the house, cardiology office called to notify him that he had an acute kidney injury from recent labs done and was advised to hydrate himself.  While still on the phone with the cardiology office, wife, who witnessed both falls told the office about the syncopal episodes and was advised to activate EMS.On arrival of EMS team, patient was noted to be hypotensive and 1.5 L of IV fluid was provided PTA with improvement in blood pressure.   ED Course:  VS-bradycardia, but otherwise stable blood pressure,  labs:hemoglobin 12.5, sodium of 131.  Magnesium 1.6  albumin 3.3, AST 60, ALT 33, BNP 144, urinalysis was normal. CT head without contrast>>no acute intracranial abnormality. CXR> emphysema with bronchitic changes. Patient is admitted for further management Seen by PT OT did well ambulating 70 feet in hallway. Carotid ultrasound showed less than 50% stenosis, echocardiogram obtained-shows EF 50%, has WMA, right ventricle systolic function is normal mitral valve is normal aortic valve is not well-visualized and no significant change from prior study.He will be discharged home cleared by cardiology   Discharge Diagnoses:  Principal Problem:   Syncope Active Problems:   Mixed hyperlipidemia   Tobacco abuse   GERD (gastroesophageal reflux disease)   CAD (coronary artery disease)   Essential hypertension   COPD (chronic obstructive pulmonary disease) (HCC)   Persistent atrial fibrillation (HCC)   Chronic HFrEF (heart failure with reduced ejection fraction) (HCC)   Hypomagnesemia   Hypoalbuminemia due to protein-calorie malnutrition (HCC)   Elevated brain natriuretic peptide (BNP) level   Malnutrition of moderate degree  Syncope:Likely in the setting of hypotensive as noted by EMS and was given 1.5 L crystalloid in the field.  Rule out other etiology given his significant cardiac history, interestingly he has a Zio patch on- WILL ask cardiology to review.  Troponin flat EKG A-fib, vitals appears stable.  Renal function stable although had AKI on recent outpatient lab on 10/3. TTE 06/2022 showed LVEF of 45 to 50%.Positive RWMA.  Mild asymmetric LVH. unremakable carotid Doppler.  Orthostatic vitals are negative.  found for this or any previous visit (from the past 240 hour(s)).  Procedures/Studies: ECHOCARDIOGRAM COMPLETE  Result Date: 05/15/2023    ECHOCARDIOGRAM REPORT   Patient Name:   [name] Date of Exam: 05/15/2023 Medical Rec #:  [id number]     Height:       66.0 in Accession #:    [id number]    Weight:       153.0 lb Date of Birth:  1946-04-05     BSA:          1.785 m Patient Age:    77 years      BP:           130/80 mmHg Patient Gender: M             HR:           65 bpm. [facility] Procedure: 2D Echo, Cardiac Doppler and Color Doppler Indications:    Syncope  History:        Patient has prior history of Echocardiogram examinations, most                 recent 07/03/2022. Previous Myocardial Infarction and CAD, COPD;                 Arrythmias:Atrial Fibrillation.  Sonographer:    Darlys Gales Referring Phys: [id number] OLADAPO ADEFESO IMPRESSIONS  1. Left ventricular ejection fraction, by estimation, is 50%. The left ventricle has low normal function. The left ventricle demonstrates regional wall motion abnormalities (see scoring diagram/findings for description). Left ventricular diastolic function could not be evaluated.  2. Right ventricular systolic function is normal. The right ventricular size is normal. There is mildly elevated pulmonary artery  systolic pressure. The estimated right ventricular systolic pressure is 45.0 mmHg.  3. Left atrial size was mildly dilated.  4. The mitral valve is normal in structure. Mild mitral valve regurgitation. No evidence of mitral stenosis.  5. The aortic valve was not well visualized. Aortic valve regurgitation is not visualized. No aortic stenosis is present.  6. The inferior vena cava is dilated in size with >50% respiratory variability, suggesting right atrial pressure of 8 mmHg. Comparison(s): No significant change from prior study. Prior images reviewed side by side. FINDINGS  Left Ventricle: Left ventricular ejection fraction, by estimation, is 50%. The left ventricle has low normal function. The left ventricle demonstrates regional wall motion abnormalities. The left ventricular internal cavity size was normal in size. There is no left ventricular hypertrophy. Left ventricular diastolic function could not be evaluated due to atrial fibrillation. Left ventricular diastolic function could not be evaluated.  LV Wall Scoring: The entire inferior wall is hypokinetic. Right Ventricle: The right ventricular size is normal. No increase in right ventricular wall thickness. Right ventricular systolic function is normal. There is mildly elevated pulmonary artery systolic pressure. The tricuspid regurgitant velocity is 3.04  m/s, and with an assumed right atrial pressure of 8 mmHg, the estimated right ventricular systolic pressure is 45.0 mmHg. Left Atrium: Left atrial size was mildly dilated. Right Atrium: Right atrial size was normal in size. Pericardium: There is no evidence of pericardial effusion. Mitral Valve: The mitral valve is normal in structure. Mild mitral valve regurgitation. No evidence of mitral valve stenosis. Tricuspid Valve: The tricuspid valve is grossly normal. Tricuspid valve regurgitation is trivial. No evidence of tricuspid stenosis. Aortic Valve: The aortic valve was not well visualized. Aortic valve  regurgitation is not visualized. No aortic stenosis is present.  Physician Discharge Summary  [name] ZOX:[id number] DOB: [birth year] DOA: 05/14/2023  PCP: Mechele Claude, MD  Admit date: 05/14/2023 Discharge date: 05/15/2023 Recommendations for Outpatient Follow-up:  Follow up with PCP in 1 weeks-call for appointment Please obtain BMP/CBC in one week F/u with cardiology as OP.  Discharge Dispo: Home Discharge Condition: Stable Code Status:   Code Status: Limited: Do not attempt resuscitation (DNR) -DNR-LIMITED -Do Not Intubate/DNI  Diet recommendation:  Diet Order             Diet Heart Room service appropriate? Yes; Fluid consistency: Thin  Diet effective now                  Brief/Interim Summary: 77 y.o.m w/ history of  HTN,persistent atrial fibrillation on warfarin, chronic HFrEF, CAD s/p CABG, COPD, GERD, tobacco use disorder, alcohol use who presented to ED due to transitory loss of consciousness.  Patient states that he fell to the ground while getting out of his truck around 2 PM 05/14/23. he complained of sudden acute onset generalized weakness and he did not think that he lost consciousness, he was able to get up w/o assist.  While walking in front of the truck, he fell to the ground again, but this time he lost consciousness for about 1 to 2 minutes and required 2 person assist to get into the house.Apparently, patient has been wearing his Zio patch for few weeks.  Shortly after being inside the house, cardiology office called to notify him that he had an acute kidney injury from recent labs done and was advised to hydrate himself.  While still on the phone with the cardiology office, wife, who witnessed both falls told the office about the syncopal episodes and was advised to activate EMS.On arrival of EMS team, patient was noted to be hypotensive and 1.5 L of IV fluid was provided PTA with improvement in blood pressure.   ED Course:  VS-bradycardia, but otherwise stable blood pressure,  labs:hemoglobin 12.5, sodium of 131.  Magnesium 1.6  albumin 3.3, AST 60, ALT 33, BNP 144, urinalysis was normal. CT head without contrast>>no acute intracranial abnormality. CXR> emphysema with bronchitic changes. Patient is admitted for further management Seen by PT OT did well ambulating 70 feet in hallway. Carotid ultrasound showed less than 50% stenosis, echocardiogram obtained-shows EF 50%, has WMA, right ventricle systolic function is normal mitral valve is normal aortic valve is not well-visualized and no significant change from prior study.He will be discharged home cleared by cardiology   Discharge Diagnoses:  Principal Problem:   Syncope Active Problems:   Mixed hyperlipidemia   Tobacco abuse   GERD (gastroesophageal reflux disease)   CAD (coronary artery disease)   Essential hypertension   COPD (chronic obstructive pulmonary disease) (HCC)   Persistent atrial fibrillation (HCC)   Chronic HFrEF (heart failure with reduced ejection fraction) (HCC)   Hypomagnesemia   Hypoalbuminemia due to protein-calorie malnutrition (HCC)   Elevated brain natriuretic peptide (BNP) level   Malnutrition of moderate degree  Syncope:Likely in the setting of hypotensive as noted by EMS and was given 1.5 L crystalloid in the field.  Rule out other etiology given his significant cardiac history, interestingly he has a Zio patch on- WILL ask cardiology to review.  Troponin flat EKG A-fib, vitals appears stable.  Renal function stable although had AKI on recent outpatient lab on 10/3. TTE 06/2022 showed LVEF of 45 to 50%.Positive RWMA.  Mild asymmetric LVH. unremakable carotid Doppler.  Orthostatic vitals are negative.  found for this or any previous visit (from the past 240 hour(s)).  Procedures/Studies: ECHOCARDIOGRAM COMPLETE  Result Date: 05/15/2023    ECHOCARDIOGRAM REPORT   Patient Name:   [name] Date of Exam: 05/15/2023 Medical Rec #:  [id number]     Height:       66.0 in Accession #:    [id number]    Weight:       153.0 lb Date of Birth:  1946-04-05     BSA:          1.785 m Patient Age:    77 years      BP:           130/80 mmHg Patient Gender: M             HR:           65 bpm. [facility]am Location:  [facility] Procedure: 2D Echo, Cardiac Doppler and Color Doppler Indications:    Syncope  History:        Patient has prior history of Echocardiogram examinations, most                 recent 07/03/2022. Previous Myocardial Infarction and CAD, COPD;                 Arrythmias:Atrial Fibrillation.  Sonographer:    Darlys Gales Referring Phys: [id number] OLADAPO ADEFESO IMPRESSIONS  1. Left ventricular ejection fraction, by estimation, is 50%. The left ventricle has low normal function. The left ventricle demonstrates regional wall motion abnormalities (see scoring diagram/findings for description). Left ventricular diastolic function could not be evaluated.  2. Right ventricular systolic function is normal. The right ventricular size is normal. There is mildly elevated pulmonary artery  systolic pressure. The estimated right ventricular systolic pressure is 45.0 mmHg.  3. Left atrial size was mildly dilated.  4. The mitral valve is normal in structure. Mild mitral valve regurgitation. No evidence of mitral stenosis.  5. The aortic valve was not well visualized. Aortic valve regurgitation is not visualized. No aortic stenosis is present.  6. The inferior vena cava is dilated in size with >50% respiratory variability, suggesting right atrial pressure of 8 mmHg. Comparison(s): No significant change from prior study. Prior images reviewed side by side. FINDINGS  Left Ventricle: Left ventricular ejection fraction, by estimation, is 50%. The left ventricle has low normal function. The left ventricle demonstrates regional wall motion abnormalities. The left ventricular internal cavity size was normal in size. There is no left ventricular hypertrophy. Left ventricular diastolic function could not be evaluated due to atrial fibrillation. Left ventricular diastolic function could not be evaluated.  LV Wall Scoring: The entire inferior wall is hypokinetic. Right Ventricle: The right ventricular size is normal. No increase in right ventricular wall thickness. Right ventricular systolic function is normal. There is mildly elevated pulmonary artery systolic pressure. The tricuspid regurgitant velocity is 3.04  m/s, and with an assumed right atrial pressure of 8 mmHg, the estimated right ventricular systolic pressure is 45.0 mmHg. Left Atrium: Left atrial size was mildly dilated. Right Atrium: Right atrial size was normal in size. Pericardium: There is no evidence of pericardial effusion. Mitral Valve: The mitral valve is normal in structure. Mild mitral valve regurgitation. No evidence of mitral valve stenosis. Tricuspid Valve: The tricuspid valve is grossly normal. Tricuspid valve regurgitation is trivial. No evidence of tricuspid stenosis. Aortic Valve: The aortic valve was not well visualized. Aortic valve  regurgitation is not visualized. No aortic stenosis is present.  found for this or any previous visit (from the past 240 hour(s)).  Procedures/Studies: ECHOCARDIOGRAM COMPLETE  Result Date: 05/15/2023    ECHOCARDIOGRAM REPORT   Patient Name:   [name] Date of Exam: 05/15/2023 Medical Rec #:  [id number]     Height:       66.0 in Accession #:    [id number]    Weight:       153.0 lb Date of Birth:  1946-04-05     BSA:          1.785 m Patient Age:    77 years      BP:           130/80 mmHg Patient Gender: M             HR:           65 bpm. [facility] Location[facility] Procedure: 2D Echo, Cardiac Doppler and Color Doppler Indications:    Syncope  History:        Patient has prior history of Echocardiogram examinations, most                 recent 07/03/2022. Previous Myocardial Infarction and CAD, COPD;                 Arrythmias:Atrial Fibrillation.  Sonographer:    Darlys Gales Referring Phys: [id number] OLADAPO ADEFESO IMPRESSIONS  1. Left ventricular ejection fraction, by estimation, is 50%. The left ventricle has low normal function. The left ventricle demonstrates regional wall motion abnormalities (see scoring diagram/findings for description). Left ventricular diastolic function could not be evaluated.  2. Right ventricular systolic function is normal. The right ventricular size is normal. There is mildly elevated pulmonary artery  systolic pressure. The estimated right ventricular systolic pressure is 45.0 mmHg.  3. Left atrial size was mildly dilated.  4. The mitral valve is normal in structure. Mild mitral valve regurgitation. No evidence of mitral stenosis.  5. The aortic valve was not well visualized. Aortic valve regurgitation is not visualized. No aortic stenosis is present.  6. The inferior vena cava is dilated in size with >50% respiratory variability, suggesting right atrial pressure of 8 mmHg. Comparison(s): No significant change from prior study. Prior images reviewed side by side. FINDINGS  Left Ventricle: Left ventricular ejection fraction, by estimation, is 50%. The left ventricle has low normal function. The left ventricle demonstrates regional wall motion abnormalities. The left ventricular internal cavity size was normal in size. There is no left ventricular hypertrophy. Left ventricular diastolic function could not be evaluated due to atrial fibrillation. Left ventricular diastolic function could not be evaluated.  LV Wall Scoring: The entire inferior wall is hypokinetic. Right Ventricle: The right ventricular size is normal. No increase in right ventricular wall thickness. Right ventricular systolic function is normal. There is mildly elevated pulmonary artery systolic pressure. The tricuspid regurgitant velocity is 3.04  m/s, and with an assumed right atrial pressure of 8 mmHg, the estimated right ventricular systolic pressure is 45.0 mmHg. Left Atrium: Left atrial size was mildly dilated. Right Atrium: Right atrial size was normal in size. Pericardium: There is no evidence of pericardial effusion. Mitral Valve: The mitral valve is normal in structure. Mild mitral valve regurgitation. No evidence of mitral valve stenosis. Tricuspid Valve: The tricuspid valve is grossly normal. Tricuspid valve regurgitation is trivial. No evidence of tricuspid stenosis. Aortic Valve: The aortic valve was not well visualized. Aortic valve  regurgitation is not visualized. No aortic stenosis is present.  found for this or any previous visit (from the past 240 hour(s)).  Procedures/Studies: ECHOCARDIOGRAM COMPLETE  Result Date: 05/15/2023    ECHOCARDIOGRAM REPORT   Patient Name:   [name] Date of Exam: 05/15/2023 Medical Rec #:  [id number]     Height:       66.0 in Accession #:    [id number]    Weight:       153.0 lb Date of Birth:  1946-04-05     BSA:          1.785 m Patient Age:    77 years      BP:           130/80 mmHg Patient Gender: M             HR:           65 bpm. [facility] Location[facility] Procedure: 2D Echo, Cardiac Doppler and Color Doppler Indications:    Syncope  History:        Patient has prior history of Echocardiogram examinations, most                 recent 07/03/2022. Previous Myocardial Infarction and CAD, COPD;                 Arrythmias:Atrial Fibrillation.  Sonographer:    Darlys Gales Referring Phys: [id number] OLADAPO ADEFESO IMPRESSIONS  1. Left ventricular ejection fraction, by estimation, is 50%. The left ventricle has low normal function. The left ventricle demonstrates regional wall motion abnormalities (see scoring diagram/findings for description). Left ventricular diastolic function could not be evaluated.  2. Right ventricular systolic function is normal. The right ventricular size is normal. There is mildly elevated pulmonary artery  systolic pressure. The estimated right ventricular systolic pressure is 45.0 mmHg.  3. Left atrial size was mildly dilated.  4. The mitral valve is normal in structure. Mild mitral valve regurgitation. No evidence of mitral stenosis.  5. The aortic valve was not well visualized. Aortic valve regurgitation is not visualized. No aortic stenosis is present.  6. The inferior vena cava is dilated in size with >50% respiratory variability, suggesting right atrial pressure of 8 mmHg. Comparison(s): No significant change from prior study. Prior images reviewed side by side. FINDINGS  Left Ventricle: Left ventricular ejection fraction, by estimation, is 50%. The left ventricle has low normal function. The left ventricle demonstrates regional wall motion abnormalities. The left ventricular internal cavity size was normal in size. There is no left ventricular hypertrophy. Left ventricular diastolic function could not be evaluated due to atrial fibrillation. Left ventricular diastolic function could not be evaluated.  LV Wall Scoring: The entire inferior wall is hypokinetic. Right Ventricle: The right ventricular size is normal. No increase in right ventricular wall thickness. Right ventricular systolic function is normal. There is mildly elevated pulmonary artery systolic pressure. The tricuspid regurgitant velocity is 3.04  m/s, and with an assumed right atrial pressure of 8 mmHg, the estimated right ventricular systolic pressure is 45.0 mmHg. Left Atrium: Left atrial size was mildly dilated. Right Atrium: Right atrial size was normal in size. Pericardium: There is no evidence of pericardial effusion. Mitral Valve: The mitral valve is normal in structure. Mild mitral valve regurgitation. No evidence of mitral valve stenosis. Tricuspid Valve: The tricuspid valve is grossly normal. Tricuspid valve regurgitation is trivial. No evidence of tricuspid stenosis. Aortic Valve: The aortic valve was not well visualized. Aortic valve  regurgitation is not visualized. No aortic stenosis is present.

## 2023-05-15 NOTE — Progress Notes (Signed)
Initial Nutrition Assessment  DOCUMENTATION CODES:   Non-severe (moderate) malnutrition in context of chronic illness  INTERVENTION:   Chocolate Ensure Enlive po BID, each supplement provides 350 kcal and 20 grams of protein. MVI with minerals daily.  NUTRITION DIAGNOSIS:   Moderate Malnutrition related to chronic illness as evidenced by mild fat depletion, mild muscle depletion, moderate muscle depletion.  GOAL:   Patient will meet greater than or equal to 90% of their needs  MONITOR:   PO intake, Supplement acceptance  REASON FOR ASSESSMENT:   Consult Assessment of nutrition requirement/status  ASSESSMENT:   77 yo male admitted with syncope. PMH includes HF, HTN, CAD, HLD, ETOH abuse, tobacco abuse, COPD, GERD, MI, prostate cancer, bladder cancer.  Spoke with patient and his wife at bedside. Patient has had poor intake for several months d/t poor appetite and taste changes. He usually eats 2 boiled eggs for breakfast, nothing for lunch, and a meal that his wife cooks for dinner. Encouraged patient to eat a small meal/snack for lunch or at least drink a supplement. He says he has never tried one, but agreed to try an Ensure supplement while in the hospital. He is hoping to go home today.   Labs reviewed. Na 134, phos 2.4  Medications reviewed and include protonix, k-phos, coumadin.  Patient meets criteria for moderate malnutrition, given mild-moderate depletion of muscle mass and mild depletion of subcutaneous fat mass.  NUTRITION - FOCUSED PHYSICAL EXAM:  Flowsheet Row Most Recent Value  Orbital Region Mild depletion  Upper Arm Region Mild depletion  Thoracic and Lumbar Region Mild depletion  Buccal Region No depletion  Temple Region Moderate depletion  Clavicle Bone Region Moderate depletion  Clavicle and Acromion Bone Region Mild depletion  Scapular Bone Region Mild depletion  Dorsal Hand Mild depletion  Patellar Region Mild depletion  Anterior Thigh Region  Mild depletion  Posterior Calf Region Mild depletion  Edema (RD Assessment) None  Hair Reviewed  Eyes Reviewed  Mouth Reviewed  Skin Reviewed  Nails Reviewed       Diet Order:   Diet Order             Diet Heart Room service appropriate? Yes; Fluid consistency: Thin  Diet effective now                   EDUCATION NEEDS:   Education needs have been addressed  Skin:  Skin Assessment: Reviewed RN Assessment  Last BM:  10/8  Height:   Ht Readings from Last 1 Encounters:  05/14/23 5\' 6"  (1.676 m)    Weight:   Wt Readings from Last 1 Encounters:  05/15/23 69.4 kg    Ideal Body Weight:  64.5 kg  BMI:  Body mass index is 24.69 kg/m.  Estimated Nutritional Needs:   Kcal:  1900-2100  Protein:  85-100 gm  Fluid:  1.9-2.1 L   Gabriel Rainwater RD, LDN, CNSC Please refer to Amion for contact information.

## 2023-05-18 DIAGNOSIS — R002 Palpitations: Secondary | ICD-10-CM | POA: Diagnosis not present

## 2023-05-21 ENCOUNTER — Encounter: Payer: Self-pay | Admitting: Family Medicine

## 2023-05-21 ENCOUNTER — Ambulatory Visit: Payer: Medicare HMO | Attending: Nurse Practitioner | Admitting: Nurse Practitioner

## 2023-05-21 ENCOUNTER — Ambulatory Visit: Payer: Medicare HMO | Admitting: *Deleted

## 2023-05-21 ENCOUNTER — Encounter: Payer: Self-pay | Admitting: Nurse Practitioner

## 2023-05-21 ENCOUNTER — Ambulatory Visit: Payer: Medicare HMO | Admitting: Family Medicine

## 2023-05-21 VITALS — BP 108/74 | HR 80 | Temp 96.8°F | Ht 66.0 in | Wt 145.8 lb

## 2023-05-21 VITALS — BP 110/68 | HR 72 | Wt 148.8 lb

## 2023-05-21 DIAGNOSIS — I10 Essential (primary) hypertension: Secondary | ICD-10-CM | POA: Diagnosis not present

## 2023-05-21 DIAGNOSIS — I2510 Atherosclerotic heart disease of native coronary artery without angina pectoris: Secondary | ICD-10-CM

## 2023-05-21 DIAGNOSIS — Z5181 Encounter for therapeutic drug level monitoring: Secondary | ICD-10-CM | POA: Diagnosis not present

## 2023-05-21 DIAGNOSIS — I4819 Other persistent atrial fibrillation: Secondary | ICD-10-CM

## 2023-05-21 DIAGNOSIS — R55 Syncope and collapse: Secondary | ICD-10-CM | POA: Diagnosis not present

## 2023-05-21 DIAGNOSIS — Z87898 Personal history of other specified conditions: Secondary | ICD-10-CM

## 2023-05-21 DIAGNOSIS — E785 Hyperlipidemia, unspecified: Secondary | ICD-10-CM | POA: Diagnosis not present

## 2023-05-21 DIAGNOSIS — I272 Pulmonary hypertension, unspecified: Secondary | ICD-10-CM

## 2023-05-21 DIAGNOSIS — I4891 Unspecified atrial fibrillation: Secondary | ICD-10-CM | POA: Diagnosis not present

## 2023-05-21 DIAGNOSIS — I5032 Chronic diastolic (congestive) heart failure: Secondary | ICD-10-CM

## 2023-05-21 DIAGNOSIS — J449 Chronic obstructive pulmonary disease, unspecified: Secondary | ICD-10-CM | POA: Diagnosis not present

## 2023-05-21 DIAGNOSIS — R0609 Other forms of dyspnea: Secondary | ICD-10-CM | POA: Diagnosis not present

## 2023-05-21 DIAGNOSIS — I472 Ventricular tachycardia, unspecified: Secondary | ICD-10-CM | POA: Insufficient documentation

## 2023-05-21 DIAGNOSIS — Z720 Tobacco use: Secondary | ICD-10-CM

## 2023-05-21 DIAGNOSIS — Z23 Encounter for immunization: Secondary | ICD-10-CM

## 2023-05-21 LAB — BMP8+EGFR
BUN/Creatinine Ratio: 10 (ref 10–24)
BUN: 11 mg/dL (ref 8–27)
CO2: 23 mmol/L (ref 20–29)
Calcium: 10.1 mg/dL (ref 8.6–10.2)
Chloride: 102 mmol/L (ref 96–106)
Creatinine, Ser: 1.13 mg/dL (ref 0.76–1.27)
Glucose: 111 mg/dL — ABNORMAL HIGH (ref 70–99)
Potassium: 5 mmol/L (ref 3.5–5.2)
Sodium: 143 mmol/L (ref 134–144)
eGFR: 68 mL/min/{1.73_m2} (ref >59)

## 2023-05-21 LAB — CBC WITH DIFFERENTIAL/PLATELET
Basophils Absolute: 0 10*3/uL (ref 0.0–0.2)
Basos: 1 %
EOS (ABSOLUTE): 0.1 10*3/uL (ref 0.0–0.4)
Eos: 2 %
Hematocrit: 40.8 % (ref 37.5–51.0)
Hemoglobin: 13.7 g/dL (ref 13.0–17.7)
Immature Grans (Abs): 0 10*3/uL (ref 0.0–0.1)
Immature Granulocytes: 0 %
Lymphocytes Absolute: 2 10*3/uL (ref 0.7–3.1)
Lymphs: 31 %
MCH: 34 pg — ABNORMAL HIGH (ref 26.6–33.0)
MCHC: 33.6 g/dL (ref 31.5–35.7)
MCV: 101 fL — ABNORMAL HIGH (ref 79–97)
Monocytes Absolute: 0.5 10*3/uL (ref 0.1–0.9)
Monocytes: 8 %
Neutrophils Absolute: 3.7 10*3/uL (ref 1.4–7.0)
Neutrophils: 58 %
Platelets: 215 10*3/uL (ref 150–450)
RBC: 4.03 x10E6/uL — ABNORMAL LOW (ref 4.14–5.80)
RDW: 13.4 % (ref 11.6–15.4)
WBC: 6.3 10*3/uL (ref 3.4–10.8)

## 2023-05-21 LAB — POCT INR: INR: 1.3 — AB (ref 2.0–3.0)

## 2023-05-21 NOTE — Patient Instructions (Signed)
Take warfarin 1 1/2 tablets tonight and tomorrow night then resume 1/2 tablet daily except 1 tablet on Tuesdays and Saturdays Recheck in 1 week

## 2023-05-21 NOTE — Patient Instructions (Addendum)

## 2023-05-21 NOTE — Progress Notes (Signed)
Subjective:  [name], male    DOB: March 15, 1946  Age: 77 y.o. MRN: [id number]  CC: Hospitalization Follow-up   HPI [name] presents for hospital follow up of syncopal episode of 05/14/23. Admitted that day. He was discharged on 05/15/23. HE had been working in the heat, mostly just watching, but was not hydrating. He passed out and EMS was called. He has bee wearing ZIO for heart monitoring. It is noted on preliminary report there are 10 episodes of V. Tach, 5 beats being the fastest, longest 10.5 seconds There has been no chest pain or palpitations associated. Has cardiology appt. Later today.     05/21/2023    9:56 AM 05/21/2023    9:13 AM 11/30/2022    1:14 PM  Depression screen PHQ 2/9  Decreased Interest 3 0 3  Down, Depressed, Hopeless 0 0 0  PHQ - 2 Score 3 0 3  Altered sleeping 0  0  Tired, decreased energy 3  3  Change in appetite 3  0  Feeling bad or failure about yourself  0  0  Trouble concentrating 0  0  Moving slowly or fidgety/restless 0  0  Suicidal thoughts 0  0  PHQ-9 Score 9  6  Difficult doing work/chores Not difficult at all  Not difficult at all    History [name] has a past medical history of Anginal pain (HCC), Atrial fibrillation by electrocardiogram (HCC) (11/2019), Atrial flutter by electrocardiogram (HCC) (11/2019), Bladder cancer (HCC) (01/2020), Bladder tumor, BPH (benign prostatic hyperplasia), CAD (coronary artery disease), COPD (chronic obstructive pulmonary disease) (HCC), Dyspnea, ETOH abuse, GERD (gastroesophageal reflux disease), History of gout, Hypercholesterolemia, Hypertension, Myocardial infarction (HCC) (2013), Prostate cancer (HCC), and Tobacco abuse.   He has a past surgical history that includes Prostate surgery; Colonoscopy; Back surgery; Esophagogastroduodenoscopy (05/27/2012); Cardiac catheterization (2011); Coronary angioplasty; Coronary artery bypass graft; Anterior cervical decompression/discectomy fusion 4 level  (N/A, 07/21/2016); Colonoscopy (N/A, 11/21/2017); polypectomy (11/21/2017); Transurethral resection of bladder tumor with mitomycin-c (Left, 01/06/2020); Cardioversion (N/A, 06/10/2020); Colonoscopy with propofol (N/A, 06/09/2021); polypectomy (06/09/2021); RIGHT/LEFT HEART CATH AND CORONARY/GRAFT ANGIOGRAPHY (N/A, 03/27/2022); and CORONARY STENT INTERVENTION (N/A, 03/27/2022).   His family history includes Diabetes in his brother, brother, and mother; Healthy in his son; Heart disease in his brother and brother; Hypertension in his father; Multiple sclerosis in his daughter.He reports that he has been smoking cigarettes and e-cigarettes. He started smoking about 53 years ago. He has a 58.2 pack-year smoking history. He has never been exposed to tobacco smoke. He quit smokeless tobacco use about 11 years ago.  His smokeless tobacco use included snuff. He reports current alcohol use of about 2.0 - 4.0 standard drinks of alcohol per week. He reports that he does not use drugs.    ROS Review of Systems  Constitutional:  Negative for fever.  Respiratory:  Negative for shortness of breath.   Cardiovascular:  Negative for chest pain.  Musculoskeletal:  Negative for arthralgias.  Skin:  Negative for rash.    Objective:  BP 108/74   Pulse 80   Temp (!) 96.8 F (36 C)   Ht 5\' 6"  (1.676 m)   Wt 145 lb 12.8 oz (66.1 kg)   SpO2 98%   BMI 23.53 kg/m   BP Readings from Last 3 Encounters:  05/21/23 110/68  05/21/23 108/74  05/15/23 (!) 151/80    Wt Readings from Last 3 Encounters:  05/21/23 148 lb 12.8 oz (67.5 kg)  05/21/23 145 lb 12.8  oz (66.1 kg)  05/15/23 153 lb (69.4 kg)     Physical Exam Vitals reviewed.  Constitutional:      Appearance: He is well-developed.  HENT:     Head: Normocephalic and atraumatic.     Right Ear: External ear normal.     Left Ear: External ear normal.     Mouth/Throat:     Pharynx: No oropharyngeal exudate or posterior oropharyngeal erythema.  Eyes:      Pupils: Pupils are equal, round, and reactive to light.  Cardiovascular:     Rate and Rhythm: Normal rate and regular rhythm.     Heart sounds: No murmur heard. Pulmonary:     Effort: No respiratory distress.     Breath sounds: Normal breath sounds.  Musculoskeletal:     Cervical back: Normal range of motion and neck supple.  Neurological:     Mental Status: He is alert and oriented to person, place, and time.       Assessment & Plan:   [name] was seen today for hospitalization follow-up.  Diagnoses and all orders for this visit:  Syncope, unspecified syncope type  Ventricular tachycardia (HCC)  Essential hypertension -     BMP8+EGFR -     CBC with Differential/Platelet  Need for influenza vaccination -     Flu Vaccine Trivalent High Dose (Fluad)       I am having [name] maintain his Vitamin D3, acetaminophen, nitroGLYCERIN, fluticasone, B Complex Vitamins (VITAMIN B COMPLEX PO), Breztri Aerosphere, albuterol, warfarin, Phosphorus Supplement, pantoprazole, spironolactone, atorvastatin, furosemide, metoprolol succinate, empagliflozin, allopurinol, magnesium oxide, and losartan.  Allergies as of 05/21/2023   No Known Allergies      Medication List        Accurate as of May 21, 2023  6:14 PM. If you have any questions, ask your nurse or doctor.          acetaminophen 500 MG tablet Commonly known as: TYLENOL Take 2 tablets (1,000 mg total) by mouth every 6 (six) hours as needed for moderate pain or headache.   albuterol 108 (90 Base) MCG/ACT inhaler Commonly known as: Ventolin HFA Inhale 2 puffs into the lungs every 6 (six) hours as needed for wheezing or shortness of breath.   allopurinol 100 MG tablet Commonly known as: ZYLOPRIM Take 1 tablet (100 mg total) by mouth daily.   atorvastatin 80 MG tablet Commonly known as: LIPITOR Take 1 tablet (80 mg total) by mouth daily.   Breztri Aerosphere 160-9-4.8 MCG/ACT Aero Generic drug:  Budeson-Glycopyrrol-Formoterol Take 2 puffs by mouth in the morning and at bedtime. Dispense 3 inhalers   empagliflozin 10 MG Tabs tablet Commonly known as: Jardiance Take 1 tablet (10 mg total) by mouth daily before breakfast.   fluticasone 50 MCG/ACT nasal spray Commonly known as: FLONASE Place 2 sprays into both nostrils daily as needed for allergies.   furosemide 20 MG tablet Commonly known as: LASIX Take 1 tablet (20 mg total) by mouth as needed (swelling).   losartan 25 MG tablet Commonly known as: COZAAR Take 1 tablet (25 mg total) by mouth daily.   magnesium oxide 400 (240 Mg) MG tablet Commonly known as: MAG-OX Take 1 tablet by mouth daily.   metoprolol succinate 50 MG 24 hr tablet Commonly known as: Toprol XL Take 1.5 tablets (75 mg total) by mouth daily.   nitroGLYCERIN 0.4 MG SL tablet Commonly known as: NITROSTAT Place 1 tablet (0.4 mg total) under the tongue every 5 (five) minutes as needed  for chest pain.   pantoprazole 40 MG tablet Commonly known as: PROTONIX TAKE 1 TABLET EVERY DAY   Phosphorus Supplement 280-160-250 MG Pack Take 1 packet by mouth 2 (two) times daily. In water or juice   spironolactone 25 MG tablet Commonly known as: Aldactone Take 0.5 tablets (12.5 mg total) by mouth 2 (two) times daily.   VITAMIN B COMPLEX PO Take 1 tablet by mouth daily.   Vitamin D3 125 MCG (5000 UT) Caps Take 5,000 Units by mouth daily.   warfarin 5 MG tablet Commonly known as: COUMADIN Take as directed by the anticoagulation clinic. If you are unsure how to take this medication, talk to your nurse or doctor. Original instructions: Take 5 mg by mouth daily. Take 1/2 - to 1 tablet daily or as directed by coumadin clinic. Tuesday and Saturday whole tab         Follow-up: No follow-ups on file.  Mechele Claude, M.D.

## 2023-05-21 NOTE — Progress Notes (Signed)
Office Visit    Patient Name: [name] Date of Encounter: 05/21/2023 PCP:  Mechele Claude, MD [facility]  Cardiologist:  Dina Rich, MD  Advanced Practice Provider:  No care team member to display Electrophysiologist:  None   Chief Complaint and HPI    [name] is a 77 y.o. male with a hx of chronic HFrEF, leg edema, CAD, s/p MI in 66 and CABG, hypertension, hyperlipidemia, A-fib, and history of COPD, who presents today for scheduled follow-up.  Previous cardiovascular history of prior MI in 76, history of CABG.  Cardiac catheterization in 2011 showed patent grafts.  Myoview in 2014 revealed old inferolateral scar, no ischemia.  Hospital admission in 2023 with atypical chest pain, given his recent drop in EF, cardiac catheterization was pursued.  Report noted below, received PCI to SVG-OM1.  Last seen by Dr. Dina Rich on October 13, 2022.  Dr. Wyline Mood stated he was on max tolerated GDMT.  Advised not to try Entresto given his blood pressures that were running soft and his history of hyperkalemia.  Stated Plavix could be discontinued in August after his last recent stent.  Patient noted some recent cough/wheezing/shortness of breath, was given 5-day course of prednisone at 40 mg daily, was told to follow-up with pulmonary.  03/20/2023 - Today he presents for follow-up with his wife. Continues to admit to chronic intermittent shortness of breath with exertion, wife says his pulmonologist says his COPD has progressed, may require . Patient denies any chest pain, palpitations, syncope, presyncope, dizziness, orthopnea, PND, swelling or significant weight changes, acute bleeding, or claudication. BP and HR log show overall stable readings.   He contacted our office after office visit noting sensation of palpitations and elevated blood pressure readings.  2 weeks ZIO XT monitor was recommended, losartan increased to 50 mg daily.  Repeat labs revealed some  AKI due to dehydration, wife admitted due to syncopal episodes, admitted to hospital for further evaluation.  Patient was found to be hypotensive, received IV fluids.  Echocardiogram arranged-see report below.  CT of the head showed no acute abnormality.  Evaluated by PT/OT.  Carotid ultrasound revealed less than 50% stenosis.  Today he presents for follow-up.  He states he has not had any more recurrent syncopal episodes. Denies any chest pain, shortness of breath, palpitations, syncope, presyncope, dizziness, orthopnea, PND, swelling or significant weight changes, acute bleeding, or claudication.  Tolerating medications well.   EKGs/Labs/Other Studies Reviewed:   The following studies were reviewed today:  Echo 05/2023: 1. Left ventricular ejection fraction, by estimation, is 50%. The left  ventricle has low normal function. The left ventricle demonstrates  regional wall motion abnormalities (see scoring diagram/findings for  description). Left ventricular diastolic  function could not be evaluated.   2. Right ventricular systolic function is normal. The right ventricular  size is normal. There is mildly elevated pulmonary artery systolic  pressure. The estimated right ventricular systolic pressure is 45.0 mmHg.   3. Left atrial size was mildly dilated.   4. The mitral valve is normal in structure. Mild mitral valve  regurgitation. No evidence of mitral stenosis.   5. The aortic valve was not well visualized. Aortic valve regurgitation  is not visualized. No aortic stenosis is present.   6. The inferior vena cava is dilated in size with >50% respiratory  variability, suggesting right atrial pressure of 8 mmHg.   Comparison(s): No significant change from prior study. Prior images  reviewed side by  side.  Carotid Doppler 05/2023: IMPRESSION: 1. Bilateral carotid bifurcation plaque resulting in less than 50% diameter ICA stenosis. 2. Antegrade bilateral vertebral arterial flow.    Echo 06/2022:  1. Left ventricular ejection fraction, by estimation, is 45 to 50%. The  left ventricle has mildly decreased function. The left ventricle  demonstrates regional wall motion abnormalities (see scoring  diagram/findings for description). There is mild  asymmetric left ventricular hypertrophy of the basal segment. Left  ventricular diastolic parameters are indeterminate.   2. Right ventricular systolic function is normal. The right ventricular  size is normal. There is normal pulmonary artery systolic pressure. The  estimated right ventricular systolic pressure is 24.3 mmHg.   3. Left atrial size was mild to moderately dilated.   4. The mitral valve is grossly normal. Mild to moderate mitral valve  regurgitation.   5. The aortic valve is tricuspid. Aortic valve regurgitation is not  visualized.   6. The inferior vena cava is normal in size with greater than 50%  respiratory variability, suggesting right atrial pressure of 3 mmHg.   Comparison(s): Prior images reviewed side by side. LVEF improved somewhat  at 45-50% with wall motion abnormalities consistent with ischemic  cardiomyopathy.  R/Left heart cath 03/2022:   Ost LAD to Prox LAD lesion is 100% stenosed.   Mid LAD lesion is 100% stenosed.   Prox Cx lesion is 100% stenosed.   Prox RCA to Mid RCA lesion is 80% stenosed.   Mid RCA lesion is 100% stenosed.   Origin to Prox Graft lesion is 80% stenosed.   Origin to Prox Graft lesion is 100% stenosed.   A drug-eluting stent was successfully placed using a SYNERGY XD 4.0X12.   Post intervention, there is a 0% residual stenosis.   LIMA graft was visualized by angiography and is normal in caliber.   SVG graft was visualized by angiography and is normal in caliber.   SVG graft was visualized by angiography and is normal in caliber.   SVG graft was visualized by angiography.   The graft exhibits no disease.   The graft exhibits no disease.   LV end diastolic pressure is  mildly elevated.   Hemodynamic findings consistent with mild pulmonary hypertension.   3 vessel occlusive CAD Patent LIMA to the LAD Patent SVG to the first diagonal Patent SVG to OM1 with 80% focal stenosis in the proximal SVG Occluded SVG to RCA. The RCA fills by left to right collaterals.  Mildly elevated LV filling pressures.  Mild pulmonary HTN.  Normal cardiac output 4.51 L/min with index 2.53.  Successful PCI of the SVG to OM1 with DES   Plan: DAPT with ASA for one month and Plavix for 12 months. Can resume oral anticoagulation in am if no bleeding noted.   Risk Assessment/Calculations:   CHA2DS2-VASc Score = 5  This indicates a 7.2% annual risk of stroke. The patient's score is based upon: CHF History: 1 HTN History: 1 Diabetes History: 0 Stroke History: 0 Vascular Disease History: 1 Age Score: 2 Gender Score: 0   Review of Systems    All other systems reviewed and are otherwise negative except as noted above.  Physical Exam    VS:  BP 110/68   Pulse 72   Wt 148 lb 12.8 oz (67.5 kg)   SpO2 97%   BMI 24.02 kg/m  , BMI Body mass index is 24.02 kg/m.  Wt Readings from Last 3 Encounters:  05/21/23 148 lb 12.8 oz (67.5 kg)  05/21/23 145 lb 12.8 oz (66.1 kg)  05/15/23 153 lb (69.4 kg)     GEN: Well nourished, well developed, in no acute distress. HEENT: normal. Neck: Supple, no JVD, carotid bruits, or masses. Cardiac: S1/S2, RRR, no murmurs, rubs, or gallops. No clubbing, cyanosis, edema.  Radials/PT 2+ and equal bilaterally.  Respiratory:  Respirations regular and unlabored, clear to auscultation bilaterally. MS: No deformity or atrophy. Skin: Warm and dry, no rash. Neuro:  Strength and sensation are intact. Psych: Normal affect.  Assessment & Plan    Chronic HFimpEF, DOE Stage C, NYHA class II-III symptoms. Echo 05/2023 revealed EF 50%. Weight stable. Euvolemic and well compensated on exam. Continue Jardiance, Lasix PRN, Losartan (would not suggest  increasing losartan to higher dose/change to [facility] as he would be symptomatic with this as he is at risk for dehydration from his PMH of syncope), Spironolactone, Metoprolol succinate, and potassium supplement. Low sodium diet, fluid restriction <2L, and daily weights encouraged. Educated to contact our office for weight gain of 2 lbs overnight or 5 lbs in one week.   2. Hx of syncope Denies any recurrent episodes. Was in setting of dehydration. Preliminary monitor report shows sinus rhythm with 10 VT episodes, nonsustained with longest episode lasting around 11 seconds, patient is asymptomatic during these episodes.  Remain in continuous A-fib.  He has pending labs with PCP that were recently obtained.  No medication changes at this time.  Encouraged him to stay well-hydrated. Continue to follow with PCP.  Echo overall benign, carotid Doppler revealed less than 50% plaque bilaterally. Orthostatics in hospital were negative.  3. CAD, s/p MI in 47 and CABG Stable with no anginal symptoms. No indication for ischemic evaluation.  Not on ASA d/t being on Coumadin. Continue current medication regimen. Heart healthy diet encouraged. ED precautions discussed.   4. HTN BP overall stable.  Blood pressure log overall shows stable BPs.  Given BP log and discussed to monitor BP at home at least 2 hours after medications and sitting for 5-10 minutes.  No medication changes at this time.  Avoid aggressive measures to lower BP due to past history of syncope.  Heart healthy diet encouraged.   5. HLD Continue atorvastatin. Heart healthy diet encouraged.   6. A-fib  Denies any tachycardia or palpitations. Continue Toprol XL. Continue to follow-up at [facility], denies any bleeding issues on coumadin. Heart healthy diet encouraged.   7. COPD, pulmonary HTN Admits to stable shortness of breath. No medication changes at this time.  Etiology of pulmonary hypertension is most likely group 2/group 3.  Follow-up  with Dr. Delton Coombes as scheduled.   8. Tobacco abuse Smoking cessation encouraged and discussed.   Disposition: Follow up in 3 months with Dina Rich, MD or APP.  Signed, Sharlene Dory, NP 05/21/2023, 2:28 PM [facility]

## 2023-05-24 NOTE — Progress Notes (Signed)
Hello Henson,  Your lab result is normal and/or stable.Some minor variations that are not significant are commonly marked abnormal, but do not represent any medical problem for you.  Best regards, Mechele Claude, M.D.

## 2023-05-30 ENCOUNTER — Ambulatory Visit: Payer: Medicare HMO | Attending: Cardiology | Admitting: *Deleted

## 2023-05-30 ENCOUNTER — Ambulatory Visit: Payer: Medicare HMO | Admitting: Family Medicine

## 2023-05-30 DIAGNOSIS — I4891 Unspecified atrial fibrillation: Secondary | ICD-10-CM | POA: Diagnosis not present

## 2023-05-30 DIAGNOSIS — Z5181 Encounter for therapeutic drug level monitoring: Secondary | ICD-10-CM

## 2023-05-30 LAB — POCT INR: INR: 1.2 — AB (ref 2.0–3.0)

## 2023-05-30 NOTE — Patient Instructions (Signed)
Increase dose to 1 tablet daily except 1/2 tablet on Mondays and Fridays Recheck in 1 week Has quit ETOH

## 2023-05-31 ENCOUNTER — Ambulatory Visit: Payer: Medicare HMO | Admitting: Family Medicine

## 2023-06-06 ENCOUNTER — Telehealth: Payer: Self-pay | Admitting: Cardiology

## 2023-06-06 ENCOUNTER — Ambulatory Visit: Payer: Medicare HMO | Attending: Cardiology | Admitting: *Deleted

## 2023-06-06 DIAGNOSIS — I4891 Unspecified atrial fibrillation: Secondary | ICD-10-CM | POA: Diagnosis not present

## 2023-06-06 DIAGNOSIS — Z5181 Encounter for therapeutic drug level monitoring: Secondary | ICD-10-CM

## 2023-06-06 LAB — POCT INR: INR: 1.4 — AB (ref 2.0–3.0)

## 2023-06-06 MED ORDER — EMPAGLIFLOZIN 10 MG PO TABS: 10.0000 mg | ORAL_TABLET | Freq: Every day | ORAL | 0 refills | Status: DC

## 2023-06-06 NOTE — Telephone Encounter (Signed)
Pt is needs some more samples of his empagliflozin (JARDIANCE) 10 MG TABS tablet    He is here seeing Misty Stanley now.

## 2023-06-06 NOTE — Patient Instructions (Signed)
Take warfarin 1 1/2 tablets tonight then increase dose to 1 tablet daily Recheck in 1 week Has quit ETOH

## 2023-06-06 NOTE — Telephone Encounter (Signed)
21 days of samples given I filed PAF on 10/23

## 2023-06-18 ENCOUNTER — Ambulatory Visit: Payer: Medicare HMO | Attending: Cardiology | Admitting: *Deleted

## 2023-06-18 DIAGNOSIS — Z5181 Encounter for therapeutic drug level monitoring: Secondary | ICD-10-CM

## 2023-06-18 DIAGNOSIS — I4891 Unspecified atrial fibrillation: Secondary | ICD-10-CM

## 2023-06-18 LAB — POCT INR: INR: 2.7 (ref 2.0–3.0)

## 2023-06-18 NOTE — Patient Instructions (Signed)
Continue warfarin 1 tablet daily Recheck in 3 week

## 2023-06-20 ENCOUNTER — Ambulatory Visit: Payer: Medicare HMO | Admitting: Family Medicine

## 2023-06-20 ENCOUNTER — Encounter: Payer: Self-pay | Admitting: Family Medicine

## 2023-06-20 VITALS — BP 103/69 | HR 76 | Temp 96.4°F | Ht 66.0 in | Wt 148.4 lb

## 2023-06-20 DIAGNOSIS — E785 Hyperlipidemia, unspecified: Secondary | ICD-10-CM

## 2023-06-20 DIAGNOSIS — I2510 Atherosclerotic heart disease of native coronary artery without angina pectoris: Secondary | ICD-10-CM | POA: Diagnosis not present

## 2023-06-20 DIAGNOSIS — Z8739 Personal history of other diseases of the musculoskeletal system and connective tissue: Secondary | ICD-10-CM

## 2023-06-20 DIAGNOSIS — I4819 Other persistent atrial fibrillation: Secondary | ICD-10-CM

## 2023-06-20 DIAGNOSIS — J449 Chronic obstructive pulmonary disease, unspecified: Secondary | ICD-10-CM

## 2023-06-20 MED ORDER — ALLOPURINOL 100 MG PO TABS
100.0000 mg | ORAL_TABLET | Freq: Every day | ORAL | 3 refills | Status: DC
Start: 2023-06-20 — End: 2024-04-24

## 2023-06-20 MED ORDER — FUROSEMIDE 20 MG PO TABS: 20.0000 mg | ORAL_TABLET | ORAL | 0 refills | Status: DC | PRN

## 2023-06-20 MED ORDER — ALBUTEROL SULFATE HFA 108 (90 BASE) MCG/ACT IN AERS: 2.0000 | INHALATION_SPRAY | Freq: Four times a day (QID) | RESPIRATORY_TRACT | 3 refills | Status: DC | PRN

## 2023-06-20 MED ORDER — ATORVASTATIN CALCIUM 80 MG PO TABS
80.0000 mg | ORAL_TABLET | Freq: Every day | ORAL | 1 refills | Status: DC
Start: 2023-06-20 — End: 2023-08-21

## 2023-06-20 MED ORDER — NITROGLYCERIN 0.4 MG SL SUBL: 0.4000 mg | SUBLINGUAL_TABLET | SUBLINGUAL | 2 refills | Status: DC | PRN

## 2023-06-20 NOTE — Progress Notes (Signed)
Subjective:  [name], male    DOB: 11-13-1945  Age: 77 y.o. MRN: [id number]  CC: Medical Management of Chronic Issues   HPI [name] presents for Atrial fibrillation follow up. Pt. is treated with rate control and anticoagulation. Pt.  denies palpitations, rapid rate, chest pain, dyspnea and edema. There has been no bleeding from nose or gums. Pt. has not noticed blood with urine or stool.  Although there is routine bruising easily, it is not excessive.  Pt. Dyspneic with exertion, but controlled with current regimen. Treated for both CHF and COPD.   Patient in for follow-up of GERD. Currently asymptomatic taking  PPI daily. There is no chest pain or heartburn. No hematemesis and no melena. No dysphagia or choking. Onset is remote. Progression is stable. Complicating factors, none.   in for follow-up of elevated cholesterol. Doing well without complaints on current medication. Denies side effects of statin including myalgia and arthralgia and nausea. Currently no chest pain, shortness of breath or other cardiovascular related symptoms noted.        06/20/2023   11:08 AM 06/20/2023   11:03 AM 05/21/2023    9:56 AM  Depression screen PHQ 2/9  Decreased Interest 2 0 3  Down, Depressed, Hopeless 0 0 0  PHQ - 2 Score 2 0 3  Altered sleeping 1  0  Tired, decreased energy 2  3  Change in appetite 1  3  Feeling bad or failure about yourself  0  0  Trouble concentrating 0  0  Moving slowly or fidgety/restless 0  0  Suicidal thoughts 0  0  PHQ-9 Score 6  9  Difficult doing work/chores Not difficult at all  Not difficult at all    History Tennis has a past medical history of Anginal pain (HCC), Atrial fibrillation by electrocardiogram (HCC) (11/2019), Atrial flutter by electrocardiogram (HCC) (11/2019), Bladder cancer (HCC) (01/2020), Bladder tumor, BPH (benign prostatic hyperplasia), CAD (coronary artery disease), COPD (chronic obstructive pulmonary disease)  (HCC), Dyspnea, ETOH abuse, GERD (gastroesophageal reflux disease), History of gout, Hypercholesterolemia, Hypertension, Myocardial infarction (HCC) (2013), Prostate cancer (HCC), and Tobacco abuse.   He has a past surgical history that includes Prostate surgery; Colonoscopy; Back surgery; Esophagogastroduodenoscopy (05/27/2012); Cardiac catheterization (2011); Coronary angioplasty; Coronary artery bypass graft; Anterior cervical decompression/discectomy fusion 4 level (N/A, 07/21/2016); Colonoscopy (N/A, 11/21/2017); polypectomy (11/21/2017); Transurethral resection of bladder tumor with mitomycin-c (Left, 01/06/2020); Cardioversion (N/A, 06/10/2020); Colonoscopy with propofol (N/A, 06/09/2021); polypectomy (06/09/2021); RIGHT/LEFT HEART CATH AND CORONARY/GRAFT ANGIOGRAPHY (N/A, 03/27/2022); and CORONARY STENT INTERVENTION (N/A, 03/27/2022).   His family history includes Diabetes in his brother, brother, and mother; Healthy in his son; Heart disease in his brother and brother; Hypertension in his father; Multiple sclerosis in his daughter.He reports that he has been smoking cigarettes and e-cigarettes. He started smoking about 53 years ago. He has a 58.3 pack-year smoking history. He has never been exposed to tobacco smoke. He quit smokeless tobacco use about 11 years ago.  His smokeless tobacco use included snuff. He reports current alcohol use of about 2.0 - 4.0 standard drinks of alcohol per week. He reports that he does not use drugs.    ROS Review of Systems  Constitutional:  Negative for fever.  Respiratory:  Negative for shortness of breath.   Cardiovascular:  Negative for chest pain.  Musculoskeletal:  Negative for arthralgias.  Skin:  Negative for rash.    Objective:  BP 103/69   Pulse 76   Temp Marland Kitchen)  96.4 F (35.8 C)   Ht 5\' 6"  (1.676 m)   Wt 148 lb 6.4 oz (67.3 kg)   SpO2 97%   BMI 23.95 kg/m   BP Readings from Last 3 Encounters:  06/20/23 103/69  05/21/23 110/68  05/21/23  108/74    Wt Readings from Last 3 Encounters:  06/20/23 148 lb 6.4 oz (67.3 kg)  05/21/23 148 lb 12.8 oz (67.5 kg)  05/21/23 145 lb 12.8 oz (66.1 kg)     Physical Exam Vitals reviewed.  Constitutional:      Appearance: He is well-developed.  HENT:     Head: Normocephalic and atraumatic.     Right Ear: External ear normal.     Left Ear: External ear normal.     Mouth/Throat:     Pharynx: No oropharyngeal exudate or posterior oropharyngeal erythema.  Eyes:     Pupils: Pupils are equal, round, and reactive to light.  Cardiovascular:     Rate and Rhythm: Normal rate and regular rhythm.     Heart sounds: No murmur heard. Pulmonary:     Effort: No respiratory distress.     Breath sounds: Normal breath sounds.  Musculoskeletal:     Cervical back: Normal range of motion and neck supple.  Neurological:     Mental Status: He is alert and oriented to person, place, and time.       Assessment & Plan:   [name] was seen today for medical management of chronic issues.  Diagnoses and all orders for this visit:  Persistent atrial fibrillation (HCC) -     Phosphorus -     Magnesium -     BMP8+EGFR  H/O: gout -     allopurinol (ZYLOPRIM) 100 MG tablet; Take 1 tablet (100 mg total) by mouth daily. -     Uric acid  Chronic obstructive pulmonary disease, unspecified COPD type (HCC) -     albuterol (VENTOLIN HFA) 108 (90 Base) MCG/ACT inhaler; Inhale 2 puffs into the lungs every 6 (six) hours as needed for wheezing or shortness of breath.  Hyperlipidemia, unspecified hyperlipidemia type -     atorvastatin (LIPITOR) 80 MG tablet; Take 1 tablet (80 mg total) by mouth daily.  Coronary artery disease involving native heart without angina pectoris, unspecified vessel or lesion type -     nitroGLYCERIN (NITROSTAT) 0.4 MG SL tablet; Place 1 tablet (0.4 mg total) under the tongue every 5 (five) minutes as needed for chest pain.  Other orders -     furosemide (LASIX) 20 MG tablet; Take 1  tablet (20 mg total) by mouth as needed (swelling).       I have discontinued [name]'s losartan. I am also having him maintain his Vitamin D3, acetaminophen, fluticasone, B Complex Vitamins (VITAMIN B COMPLEX PO), Breztri Aerosphere, warfarin, Phosphorus Supplement, pantoprazole, spironolactone, metoprolol succinate, magnesium oxide, empagliflozin, allopurinol, albuterol, atorvastatin, furosemide, and nitroGLYCERIN.  Allergies as of 06/20/2023   No Known Allergies      Medication List        Accurate as of June 20, 2023 11:59 PM. If you have any questions, ask your nurse or doctor.          STOP taking these medications    losartan 25 MG tablet Commonly known as: COZAAR Stopped by: Smriti Barkow       TAKE these medications    acetaminophen 500 MG tablet Commonly known as: TYLENOL Take 2 tablets (1,000 mg total) by mouth every 6 (six) hours as needed  for moderate pain or headache.   albuterol 108 (90 Base) MCG/ACT inhaler Commonly known as: Ventolin HFA Inhale 2 puffs into the lungs every 6 (six) hours as needed for wheezing or shortness of breath.   allopurinol 100 MG tablet Commonly known as: ZYLOPRIM Take 1 tablet (100 mg total) by mouth daily.   atorvastatin 80 MG tablet Commonly known as: LIPITOR Take 1 tablet (80 mg total) by mouth daily.   Breztri Aerosphere 160-9-4.8 MCG/ACT Aero Generic drug: Budeson-Glycopyrrol-Formoterol Take 2 puffs by mouth in the morning and at bedtime. Dispense 3 inhalers   empagliflozin 10 MG Tabs tablet Commonly known as: Jardiance Take 1 tablet (10 mg total) by mouth daily before breakfast.   fluticasone 50 MCG/ACT nasal spray Commonly known as: FLONASE Place 2 sprays into both nostrils daily as needed for allergies.   furosemide 20 MG tablet Commonly known as: LASIX Take 1 tablet (20 mg total) by mouth as needed (swelling).   magnesium oxide 400 (240 Mg) MG tablet Commonly known as: MAG-OX Take 1  tablet by mouth daily.   metoprolol succinate 50 MG 24 hr tablet Commonly known as: Toprol XL Take 1.5 tablets (75 mg total) by mouth daily.   nitroGLYCERIN 0.4 MG SL tablet Commonly known as: NITROSTAT Place 1 tablet (0.4 mg total) under the tongue every 5 (five) minutes as needed for chest pain.   pantoprazole 40 MG tablet Commonly known as: PROTONIX TAKE 1 TABLET EVERY DAY   Phosphorus Supplement 280-160-250 MG Pack Take 1 packet by mouth 2 (two) times daily. In water or juice   spironolactone 25 MG tablet Commonly known as: Aldactone Take 0.5 tablets (12.5 mg total) by mouth 2 (two) times daily.   VITAMIN B COMPLEX PO Take 1 tablet by mouth daily.   Vitamin D3 125 MCG (5000 UT) Caps Take 5,000 Units by mouth daily.   warfarin 5 MG tablet Commonly known as: COUMADIN Take as directed by the anticoagulation clinic. If you are unsure how to take this medication, talk to your nurse or doctor. Original instructions: Take 5 mg by mouth daily. Take 1/2 - to 1 tablet daily or as directed by coumadin clinic. Tuesday and Saturday whole tab         Follow-up: Return in about 3 months (around 09/20/2023) for A fib/ coumadin.  Mechele Claude, M.D.

## 2023-06-21 LAB — PHOSPHORUS: Phosphorus: 2.6 mg/dL — ABNORMAL LOW (ref 2.8–4.1)

## 2023-06-21 LAB — BMP8+EGFR
BUN/Creatinine Ratio: 13 (ref 10–24)
BUN: 12 mg/dL (ref 8–27)
CO2: 24 mmol/L (ref 20–29)
Calcium: 9.6 mg/dL (ref 8.6–10.2)
Chloride: 102 mmol/L (ref 96–106)
Creatinine, Ser: 0.96 mg/dL (ref 0.76–1.27)
Glucose: 86 mg/dL (ref 70–99)
Potassium: 4.9 mmol/L (ref 3.5–5.2)
Sodium: 140 mmol/L (ref 134–144)
eGFR: 82 mL/min/{1.73_m2} (ref >59)

## 2023-06-21 LAB — URIC ACID: Uric Acid: 3.7 mg/dL — ABNORMAL LOW (ref 3.8–8.4)

## 2023-06-21 LAB — MAGNESIUM: Magnesium: 1.8 mg/dL (ref 1.6–2.3)

## 2023-07-09 ENCOUNTER — Ambulatory Visit: Payer: Medicare HMO | Attending: Cardiology | Admitting: *Deleted

## 2023-07-09 DIAGNOSIS — Z5181 Encounter for therapeutic drug level monitoring: Secondary | ICD-10-CM | POA: Diagnosis not present

## 2023-07-09 DIAGNOSIS — I4891 Unspecified atrial fibrillation: Secondary | ICD-10-CM

## 2023-07-09 LAB — POCT INR: INR: 3.3 — AB (ref 2.0–3.0)

## 2023-07-09 NOTE — Patient Instructions (Signed)
Decrease warfarin to 1 tablet daily except 1/2 tablet on Mondays Recheck in 2 weeks

## 2023-07-16 ENCOUNTER — Telehealth: Payer: Self-pay | Admitting: Family Medicine

## 2023-07-16 NOTE — Telephone Encounter (Signed)
Copied from CRM [id number]. Topic: Clinical - Medication Question >> Jul 16, 2023 10:35 AM Maxwell Marion wrote: Reason for CRM: Pt's wife would like a call from [place] to discuss patient's Budeson-Glycopyrrol-Formoterol (BREZTRI AEROSPHERE) 160-9-4.8 MCG/ACT AERO, she says Raynelle Fanning helps them get the med from a pharmacy for free and she thinks it's time for a renewal. Also stated the pt hasn't received anything in the mail from them in a while

## 2023-07-16 NOTE — Telephone Encounter (Signed)
Wife aware

## 2023-07-20 ENCOUNTER — Telehealth: Payer: Self-pay

## 2023-07-24 ENCOUNTER — Ambulatory Visit: Payer: Medicare HMO | Attending: Cardiology | Admitting: *Deleted

## 2023-07-24 DIAGNOSIS — I4891 Unspecified atrial fibrillation: Secondary | ICD-10-CM | POA: Diagnosis not present

## 2023-07-24 DIAGNOSIS — Z5181 Encounter for therapeutic drug level monitoring: Secondary | ICD-10-CM | POA: Diagnosis not present

## 2023-07-24 LAB — POCT INR: INR: 3.4 — AB (ref 2.0–3.0)

## 2023-07-24 NOTE — Patient Instructions (Signed)
Hold warfarin tonight then decrease dose to 1 tablet daily except 1/2 tablet on Mondays and Thursdays Recheck in 3 weeks 

## 2023-07-25 NOTE — Progress Notes (Signed)
Pharmacy Medication Assistance Program Note    08/06/2023  [name], male   DOB: July 29, 1946, 77 y.o.   MRN: [id number]     07/20/2023  [employer]reach Med[employer]  Manufacturer Medication One Nurse, adult Drugs Bretztri  Type of Radiographer, therapeutic Assistance  Date Application Sent to Patient 07/25/2023  Application Items Requested Application  Date Application Received From Patient 08/02/2023  Date Application Submitted to Manufacturer 08/06/2023  Method Application Sent to Manufacturer Fax    Application faxed.  Please send a new RX to [facility] pharmacy for renewal. Thanks!

## 2023-07-26 NOTE — Telephone Encounter (Signed)
Copied from CRM [id number]. Topic: Clinical - Medication Question >> Jul 26, 2023  1:04 PM Ivette P wrote: Reason for CRM: Patient said she was supposed to fill out some paper work for a medication request but has not received anything in the mail.

## 2023-07-30 ENCOUNTER — Other Ambulatory Visit: Payer: Self-pay | Admitting: Cardiology

## 2023-08-02 NOTE — Telephone Encounter (Signed)
Pts wife dropped off med assistance forms that were signed. They have been faxed to [facility].  Wife requested samples of Breztri because pt is completely out. Pt was given to boxes per nurse Paula Compton approval.

## 2023-08-15 ENCOUNTER — Ambulatory Visit: Payer: Medicare HMO | Attending: Cardiology | Admitting: *Deleted

## 2023-08-15 DIAGNOSIS — I4891 Unspecified atrial fibrillation: Secondary | ICD-10-CM

## 2023-08-15 DIAGNOSIS — Z5181 Encounter for therapeutic drug level monitoring: Secondary | ICD-10-CM | POA: Diagnosis not present

## 2023-08-15 LAB — POCT INR: INR: 2.3 (ref 2.0–3.0)

## 2023-08-15 NOTE — Patient Instructions (Signed)
Continue warfarin 1 tablet daily except 1/2 tablet on Mondays and Thursdays Recheck in 4 weeks 

## 2023-08-19 ENCOUNTER — Other Ambulatory Visit: Payer: Self-pay | Admitting: Nurse Practitioner

## 2023-08-21 ENCOUNTER — Encounter: Payer: Self-pay | Admitting: Nurse Practitioner

## 2023-08-21 ENCOUNTER — Ambulatory Visit: Payer: Medicare HMO | Attending: Nurse Practitioner | Admitting: Nurse Practitioner

## 2023-08-21 VITALS — BP 122/72 | HR 65 | Ht 66.0 in | Wt 144.0 lb

## 2023-08-21 DIAGNOSIS — Z87898 Personal history of other specified conditions: Secondary | ICD-10-CM | POA: Diagnosis not present

## 2023-08-21 DIAGNOSIS — I272 Pulmonary hypertension, unspecified: Secondary | ICD-10-CM | POA: Diagnosis not present

## 2023-08-21 DIAGNOSIS — I5032 Chronic diastolic (congestive) heart failure: Secondary | ICD-10-CM

## 2023-08-21 DIAGNOSIS — J449 Chronic obstructive pulmonary disease, unspecified: Secondary | ICD-10-CM

## 2023-08-21 DIAGNOSIS — E785 Hyperlipidemia, unspecified: Secondary | ICD-10-CM | POA: Diagnosis not present

## 2023-08-21 DIAGNOSIS — I2510 Atherosclerotic heart disease of native coronary artery without angina pectoris: Secondary | ICD-10-CM

## 2023-08-21 DIAGNOSIS — R0609 Other forms of dyspnea: Secondary | ICD-10-CM

## 2023-08-21 DIAGNOSIS — I4819 Other persistent atrial fibrillation: Secondary | ICD-10-CM | POA: Diagnosis not present

## 2023-08-21 DIAGNOSIS — K219 Gastro-esophageal reflux disease without esophagitis: Secondary | ICD-10-CM

## 2023-08-21 DIAGNOSIS — I10 Essential (primary) hypertension: Secondary | ICD-10-CM

## 2023-08-21 DIAGNOSIS — Z720 Tobacco use: Secondary | ICD-10-CM

## 2023-08-21 MED ORDER — PANTOPRAZOLE SODIUM 40 MG PO TBEC: 40.0000 mg | DELAYED_RELEASE_TABLET | Freq: Every day | ORAL | 1 refills | Status: DC

## 2023-08-21 MED ORDER — ATORVASTATIN CALCIUM 80 MG PO TABS: 80.0000 mg | ORAL_TABLET | Freq: Every day | ORAL | 1 refills | Status: DC

## 2023-08-21 MED ORDER — NITROGLYCERIN 0.4 MG SL SUBL: 0.4000 mg | SUBLINGUAL_TABLET | SUBLINGUAL | 2 refills | Status: AC | PRN

## 2023-08-21 MED ORDER — FUROSEMIDE 20 MG PO TABS: 20.0000 mg | ORAL_TABLET | ORAL | 1 refills | Status: DC | PRN

## 2023-08-21 MED ORDER — SPIRONOLACTONE 25 MG PO TABS: 12.5000 mg | ORAL_TABLET | Freq: Two times a day (BID) | ORAL | 1 refills | Status: DC

## 2023-08-21 MED ORDER — AMLODIPINE BESYLATE 5 MG PO TABS: 5.0000 mg | ORAL_TABLET | Freq: Every day | ORAL | 1 refills | Status: DC

## 2023-08-21 NOTE — Patient Instructions (Addendum)

## 2023-08-21 NOTE — Progress Notes (Signed)
 Office Visit    Patient Name: [name] Date of Encounter: 08/21/2023 PCP:  Zollie Lowers, MD [facility]  Cardiologist:  Alvan Carrier, MD  Advanced Practice Provider:  No care team member to display Electrophysiologist:  None   Chief Complaint and HPI    [name] is a 78 y.o. male with a hx of chronic HFrEF, leg edema, CAD, s/p MI in 77 and CABG, hypertension, hyperlipidemia, A-fib, and history of COPD, who presents today for scheduled follow-up.  Previous cardiovascular history of prior MI in 60, history of CABG.  Cardiac catheterization in 2011 showed patent grafts.  Myoview in 2014 revealed old inferolateral scar, no ischemia.  Hospital admission in 2023 with atypical chest pain, given his recent drop in EF, cardiac catheterization was pursued.  Report noted below, received PCI to SVG-OM1.  Last seen by Dr. Carrier Alvan on October 13, 2022.  Dr. Alvan stated he was on max tolerated GDMT.  Advised not to try Entresto given his blood pressures that were running soft and his history of hyperkalemia.  Stated Plavix  could be discontinued in August after his last recent stent.  Patient noted some recent cough/wheezing/shortness of breath, was given 5-day course of prednisone  at 40 mg daily, was told to follow-up with pulmonary.  I last saw patient on May 21, 2023 for follow-up shortly after patient was discharged from hospital due to syncopal episodes, patient was found to be hypotensive in the hospital received IV fluids.  Echocardiogram was arranged-see full report below.  Carotid ultrasound revealed bilateral ICA stenosis less than 50%, CT of the head was negative for anything acute.  At follow-up in October, he told me he had not had any more recurrent syncopal episodes.  He denied any chest pain at that time.  Today he presents for follow-up.  He noticed chronic, stable shortness of breath with exertion. Denies any chest pain, palpitations,  syncope, presyncope, dizziness, orthopnea, PND, swelling or significant weight changes, acute bleeding, or claudication.  EKGs/Labs/Other Studies Reviewed:   The following studies were reviewed today:  Cardiac monitor 06/2023:   14 day monitor   Afib throughout study (100% burden). Rates 41 to 131, average 67 bpm.   Rare ventricular ectopy in the form of isolated PVCs, couplets, triplets. 10 runs of NSVT longest 10.5 seconds   No reported symptoms     Patch Wear Time:  13 days and 23 hours (2024-09-23T14:39:43-0400 to 2024-10-07T14:39:35-0400)   10 Ventricular Tachycardia runs occurred, the run with the fastest interval lasting 5 beats with a max rate of 179 bpm, the longest lasting 10.5 secs with an avg rate of 124 bpm. Atrial Fibrillation occurred continuously (100% burden), ranging from  41-131 bpm (avg of 67 bpm). Isolated VEs were rare (<1.0%, 6304), VE Couplets were rare (<1.0%, 121), and VE Triplets were rare (<1.0%, 11).    Echo 05/2023: 1. Left ventricular ejection fraction, by estimation, is 50%. The left  ventricle has low normal function. The left ventricle demonstrates  regional wall motion abnormalities (see scoring diagram/findings for  description). Left ventricular diastolic  function could not be evaluated.   2. Right ventricular systolic function is normal. The right ventricular  size is normal. There is mildly elevated pulmonary artery systolic  pressure. The estimated right ventricular systolic pressure is 45.0 mmHg.   3. Left atrial size was mildly dilated.   4. The mitral valve is normal in structure. Mild mitral valve  regurgitation. No evidence of mitral stenosis.  5. The aortic valve was not well visualized. Aortic valve regurgitation  is not visualized. No aortic stenosis is present.   6. The inferior vena cava is dilated in size with >50% respiratory  variability, suggesting right atrial pressure of 8 mmHg.   Comparison(s): No significant change from  prior study. Prior images  reviewed side by side.  Carotid Doppler 05/2023: IMPRESSION: 1. Bilateral carotid bifurcation plaque resulting in less than 50% diameter ICA stenosis. 2. Antegrade bilateral vertebral arterial flow.   Echo 06/2022:  1. Left ventricular ejection fraction, by estimation, is 45 to 50%. The  left ventricle has mildly decreased function. The left ventricle  demonstrates regional wall motion abnormalities (see scoring  diagram/findings for description). There is mild  asymmetric left ventricular hypertrophy of the basal segment. Left  ventricular diastolic parameters are indeterminate.   2. Right ventricular systolic function is normal. The right ventricular  size is normal. There is normal pulmonary artery systolic pressure. The  estimated right ventricular systolic pressure is 24.3 mmHg.   3. Left atrial size was mild to moderately dilated.   4. The mitral valve is grossly normal. Mild to moderate mitral valve  regurgitation.   5. The aortic valve is tricuspid. Aortic valve regurgitation is not  visualized.   6. The inferior vena cava is normal in size with greater than 50%  respiratory variability, suggesting right atrial pressure of 3 mmHg.   Comparison(s): Prior images reviewed side by side. LVEF improved somewhat  at 45-50% with wall motion abnormalities consistent with ischemic  cardiomyopathy.  R/Left heart cath 03/2022:   Ost LAD to Prox LAD lesion is 100% stenosed.   Mid LAD lesion is 100% stenosed.   Prox Cx lesion is 100% stenosed.   Prox RCA to Mid RCA lesion is 80% stenosed.   Mid RCA lesion is 100% stenosed.   Origin to Prox Graft lesion is 80% stenosed.   Origin to Prox Graft lesion is 100% stenosed.   A drug-eluting stent was successfully placed using a SYNERGY XD 4.0X12.   Post intervention, there is a 0% residual stenosis.   LIMA graft was visualized by angiography and is normal in caliber.   SVG graft was visualized by angiography and  is normal in caliber.   SVG graft was visualized by angiography and is normal in caliber.   SVG graft was visualized by angiography.   The graft exhibits no disease.   The graft exhibits no disease.   LV end diastolic pressure is mildly elevated.   Hemodynamic findings consistent with mild pulmonary hypertension.   3 vessel occlusive CAD Patent LIMA to the LAD Patent SVG to the first diagonal Patent SVG to OM1 with 80% focal stenosis in the proximal SVG Occluded SVG to RCA. The RCA fills by left to right collaterals.  Mildly elevated LV filling pressures.  Mild pulmonary HTN.  Normal cardiac output 4.51 L/min with index 2.53.  Successful PCI of the SVG to OM1 with DES   Plan: DAPT with ASA for one month and Plavix  for 12 months. Can resume oral anticoagulation in am if no bleeding noted.   Risk Assessment/Calculations:   CHA2DS2-VASc Score = 5  This indicates a 7.2% annual risk of stroke. The patient's score is based upon: CHF History: 1 HTN History: 1 Diabetes History: 0 Stroke History: 0 Vascular Disease History: 1 Age Score: 2 Gender Score: 0   Review of Systems    All other systems reviewed and are otherwise negative except as noted  above.  Physical Exam    VS:  BP 122/72   Pulse 65   Ht 5' 6 (1.676 m)   Wt 144 lb (65.3 kg)   SpO2 98%   BMI 23.24 kg/m  , BMI Body mass index is 23.24 kg/m.  Wt Readings from Last 3 Encounters:  08/21/23 144 lb (65.3 kg)  06/20/23 148 lb 6.4 oz (67.3 kg)  05/21/23 148 lb 12.8 oz (67.5 kg)     GEN: Well nourished, well developed, in no acute distress. HEENT: normal. Neck: Supple, no JVD, carotid bruits, or masses. Cardiac: S1/S2, irregularly irregular rhythm, no murmurs, rubs, or gallops. No clubbing, cyanosis, edema.  Radials/PT 2+ and equal bilaterally.  Respiratory:  Respirations regular and unlabored, clear to auscultation bilaterally. MS: No deformity or atrophy. Skin: Warm and dry, no rash. Neuro:  Strength and  sensation are intact. Psych: Normal affect.  Assessment & Plan    Chronic HFimpEF, DOE Stage C, NYHA class II-III symptoms. Echo 05/2023 revealed EF 50%. Weight stable. Euvolemic and well compensated on exam. Continue current GDMT.  Low sodium diet, fluid restriction <2L, and daily weights encouraged. Educated to contact our office for weight gain of 2 lbs overnight or 5 lbs in one week.   2. Hx of syncope Denies any recurrent episodes. Was in setting of dehydration. Monitor report shows sinus rhythm with 10 VT episodes, nonsustained with longest episode lasting around 11 seconds, patient asymptomatic during these episodes.  Remain in continuous A-fib.   No medication changes at this time.  Encouraged him to stay well-hydrated. Continue to follow with PCP.  Echo overall benign, carotid Doppler revealed less than 50% plaque bilaterally. Orthostatics in hospital were negative.  Care and ED precautions discussed.  3. CAD, s/p MI in 66 and CABG Stable with no anginal symptoms. No indication for ischemic evaluation.  Not on ASA d/t being on Coumadin . Continue current medication regimen. Heart healthy diet encouraged. ED precautions discussed.   4. HTN BP overall stable. Discussed to monitor BP at home at least 2 hours after medications and sitting for 5-10 minutes.  No medication changes at this time.  Avoid aggressive measures to lower BP due to past history of syncope.  Heart healthy diet encouraged.   5. HLD Continue atorvastatin . Heart healthy diet encouraged.   6. A-fib  Denies any tachycardia or palpitations. Continue Toprol  XL. Continue to follow-up at Coumadin  Clinic, denies any bleeding issues on coumadin . Heart healthy diet encouraged.   7. COPD, pulmonary HTN Admits to stable shortness of breath. No medication changes at this time.  Etiology of pulmonary hypertension is most likely group 2/group 3.  Follow-up with Dr. Shelah as scheduled.  Dr. Shelah stated in June 2024 that he felt that  his shortness of breath with exertion was more than likely due to progression of his COPD.  I am in agreement with this.  Patient and wife state he will be due for repeat PFTs at follow-up with pulmonology later this year.  8. Tobacco abuse Smoking cessation encouraged and discussed.   9. GERD Denies any recent symptoms, however will provide refill of pantoprazole  per patient's request. Continue to follow with PCP.  Disposition: Will provide refills per patient request.  Follow up in 6 months with Alvan Carrier, MD or APP.  Signed, Almarie Crate, NP 08/21/2023, 1:16 PM [facility]

## 2023-09-11 ENCOUNTER — Other Ambulatory Visit: Payer: Self-pay | Admitting: Cardiology

## 2023-09-12 ENCOUNTER — Ambulatory Visit: Payer: Medicare HMO | Attending: Cardiology | Admitting: *Deleted

## 2023-09-12 DIAGNOSIS — Z5181 Encounter for therapeutic drug level monitoring: Secondary | ICD-10-CM | POA: Diagnosis not present

## 2023-09-12 DIAGNOSIS — I4891 Unspecified atrial fibrillation: Secondary | ICD-10-CM

## 2023-09-12 LAB — POCT INR: POC INR: 3

## 2023-09-12 NOTE — Patient Instructions (Signed)
 Description   Continue warfarin 1 tablet daily except 1/2 tablet on Mondays and Thursdays Recheck in 5 weeks

## 2023-09-21 ENCOUNTER — Telehealth: Payer: Self-pay | Admitting: Family Medicine

## 2023-09-21 ENCOUNTER — Telehealth: Payer: Self-pay

## 2023-09-21 NOTE — Telephone Encounter (Signed)
I'm sorry to say I do not remember anything about this.

## 2023-09-21 NOTE — Telephone Encounter (Signed)
Copied from CRM [id number]. Topic: General - Other >> Sep 21, 2023 11:28 AM Geroge Baseman wrote: Reason for CRM: Please call patients wife back ASAP to follow up on paperwork that was turned in for husbands medication per her request. States she has not heard anything since papers were dropped off.

## 2023-09-21 NOTE — Telephone Encounter (Signed)
Do you know anything about this?

## 2023-09-21 NOTE — Telephone Encounter (Signed)
Pts wife calling to check status of this. Durward Mallard, can you advise?

## 2023-09-21 NOTE — Telephone Encounter (Signed)
This is for his [name], it has been about a month ago, which was faxed.

## 2023-09-21 NOTE — Telephone Encounter (Signed)
Copied from CRM [id number]. Topic: Clinical - Prescription Issue >> Sep 21, 2023  2:36 PM Ivette P wrote: Reason for CRM: Please call patients wife back ASAP to follow up on paperwork that was turned in for husbands medication per her request.   Pt states no one has reached out to him about this medication   Budeson-Glycopyrrol-Formoterol (BREZTRI AEROSPHERE) 160-9-4.8 MCG/ACT AERO.    Call back Number [phone]

## 2023-09-24 ENCOUNTER — Other Ambulatory Visit: Payer: Self-pay | Admitting: Family Medicine

## 2023-09-24 ENCOUNTER — Other Ambulatory Visit: Payer: Self-pay | Admitting: Nurse Practitioner

## 2023-09-24 ENCOUNTER — Ambulatory Visit: Payer: Medicare HMO | Admitting: Family Medicine

## 2023-09-24 DIAGNOSIS — J449 Chronic obstructive pulmonary disease, unspecified: Secondary | ICD-10-CM

## 2023-09-24 MED ORDER — BREZTRI AEROSPHERE 160-9-4.8 MCG/ACT IN AERO: 2.0000 | INHALATION_SPRAY | Freq: Two times a day (BID) | RESPIRATORY_TRACT | 3 refills | Status: DC

## 2023-09-24 NOTE — Progress Notes (Unsigned)
Pharmacy Medication Assistance Program Note    09/24/2023  [name], male   DOB: 1946-02-01, 78 y.o.   MRN: [id number]     07/20/2023  Outreach Medication [employer]  Manufacturer Medication One Nurse, adult Drugs Bretztri  Type of Radiographer, therapeutic Assistance  Date Application Sent to Patient 07/25/2023  Application Items Requested Application  Date Application Received From Patient 08/02/2023  Date Application Submitted to Manufacturer 08/06/2023  Method Application Sent to Manufacturer Fax  Patient Assistance Determination Approved  Approval End Date 08/06/2024     Patient approved but new RX is needed. Please send a 90 day RX to [facility] pharmacy. Thnks!

## 2023-09-24 NOTE — Telephone Encounter (Signed)
 Please let the patient know that I sent their prescription to their pharmacy. Thanks, WS

## 2023-09-25 ENCOUNTER — Encounter: Payer: Self-pay | Admitting: Pharmacist

## 2023-09-25 NOTE — Telephone Encounter (Signed)
Lm on vm stating labs were called in. LS

## 2023-09-25 NOTE — Telephone Encounter (Signed)
 Message sent to patient via mychart

## 2023-09-25 NOTE — Telephone Encounter (Addendum)
[name], CPhT    Patient approved for 2025, but will need new RX send to [facility]. Routed as well on PAP telephone note today.   Pt aware this has been taken care of with pt assistance and Bretzi refilled to [facility] by PCP on 09/24/23

## 2023-09-25 NOTE — Telephone Encounter (Signed)
[facility] rx for breztri sent

## 2023-10-04 ENCOUNTER — Encounter: Payer: Self-pay | Admitting: Emergency Medicine

## 2023-10-04 ENCOUNTER — Ambulatory Visit: Payer: Medicare HMO | Admitting: Emergency Medicine

## 2023-10-04 VITALS — BP 128/78 | HR 62 | Temp 97.4°F | Ht 66.0 in | Wt 144.4 lb

## 2023-10-04 DIAGNOSIS — J449 Chronic obstructive pulmonary disease, unspecified: Secondary | ICD-10-CM

## 2023-10-04 DIAGNOSIS — Z720 Tobacco use: Secondary | ICD-10-CM

## 2023-10-04 MED ORDER — BREZTRI AEROSPHERE 160-9-4.8 MCG/ACT IN AERO: 2.0000 | INHALATION_SPRAY | Freq: Two times a day (BID) | RESPIRATORY_TRACT | Status: DC

## 2023-10-04 NOTE — Assessment & Plan Note (Signed)
 No flares.  Flu shot is up-to-date.  He still is significantly symptomatic.  He did not desaturate on ambulation at our last visit.  He gets his Breztri with Dr. Darlyn Read and is due for shipment.  Will give him a sample today to tide him over until it arrives.  Please continue Breztri 2 puffs twice a day.  Rinse and gargle after using. Keep albuterol available to use 2 puffs up to every 4 hours if needed for shortness of breath, chest tightness, wheezing.  Please follow Dr. Delton Coombes in 1 year, sooner if you have any problems.

## 2023-10-04 NOTE — Progress Notes (Signed)
   Subjective:    [name], male    DOB: 17-Apr-1946, 78 y.o.   MRN: [id number]  HPI  ROV 10/04/2023 --78 year old gentleman with a history of active tobacco use and associated emphysematous COPD.  He has mild obstruction on PFT.  Also with a history of CAD/CABG, hypertension, A-fib/flutter, prostate cancer, bladder cancer.  He participates in lung cancer screening program and had his CT chest done 04/2023. Currently managed on Breztri He can get exertional SOB with walking to his trash cans - about 50 ft. He will often use his albuterol with this exertion. He has a morning cough, clears tan phlegm. No hemoptysis. Rare wheeze. No flares since I last saw him. Smokes 10-20 cig a day. Flu shot is up to date, did not get COVID or RSV.    Lung cancer screening CT chest 04/13/2023 reviewed by me shows biapical pleural-parenchymal scarring and centrilobular emphysema, respiratory bronchiolitis with some scattered nodules all less than 3 mm in size, no new pulmonary nodules.  Stable.  RADS 2 study   Review of Systems As per HPI     Objective:   Physical Exam  Vitals:   10/04/23 0900  BP: 128/78  Pulse: 62  Temp: (!) 97.4 F (36.3 C)  TempSrc: Temporal  SpO2: 98%  Weight: 144 lb 6.4 oz (65.5 kg)  Height: 5\' 6"  (1.676 m)   Gen: Pleasant, well-nourished, in no distress,  normal affect  ENT: No lesions,  mouth clear,  oropharynx clear, no postnasal drip  Neck: No JVD, no stridor  Lungs: No use of accessory muscles, no crackles or wheezing on normal respiration, he has cough and some mild wheeze on forced expiration  Cardiovascular: RRR, heart sounds normal, no murmur or gallops, no peripheral edema  Musculoskeletal: No deformities, no cyanosis or clubbing  Neuro: alert, awake, non focal  Skin: Warm, no lesions or rash     Assessment & Plan:  COPD (chronic obstructive pulmonary disease) (HCC) No flares.  Flu shot is up-to-date.  He still is significantly symptomatic.   He did not desaturate on ambulation at our last visit.  He gets his Breztri with Dr. Darlyn Read and is due for shipment.  Will give him a sample today to tide him over until it arrives.  Please continue Breztri 2 puffs twice a day.  Rinse and gargle after using. Keep albuterol available to use 2 puffs up to every 4 hours if needed for shortness of breath, chest tightness, wheezing.  Please follow Dr. Delton Coombes in 1 year, sooner if you have any problems.  Tobacco abuse Contemplating cutting down and cessation but is not ready to set a quit date.  Acknowledges that is difficult because people around him still smoke.  We will try to get down to 10 cigarettes daily or less by his next visit.  He participates in lung cancer screening program, next scan due September 2025.    [name], MD, PhD 10/04/2023, 9:22 AM [facility] [phone] or if no answer [phone]

## 2023-10-04 NOTE — Addendum Note (Signed)
 Addended by: Aundra Dubin on: 10/04/2023 09:39 AM   Modules accepted: Orders

## 2023-10-04 NOTE — Assessment & Plan Note (Signed)
 Contemplating cutting down and cessation but is not ready to set a quit date.  Acknowledges that is difficult because people around him still smoke.  We will try to get down to 10 cigarettes daily or less by his next visit.  He participates in lung cancer screening program, next scan due September 2025.

## 2023-10-04 NOTE — Patient Instructions (Signed)
 We reviewed your lung cancer screening CT chest today.  This was stable.  Good news.  You will need a repeat scan in September 2025. Please continue Breztri 2 puffs twice a day.  Rinse and gargle after using. Keep albuterol available to use 2 puffs up to every 4 hours if needed for shortness of breath, chest tightness, wheezing.  Continue to work hard on decreasing your cigarettes.  We set a goal today to get down to less than 10 cigarettes daily. Please follow Dr. Delton Coombes in 1 year, sooner if you have any problems.

## 2023-10-17 ENCOUNTER — Ambulatory Visit: Payer: Medicare HMO | Attending: Cardiology | Admitting: *Deleted

## 2023-10-17 DIAGNOSIS — Z5181 Encounter for therapeutic drug level monitoring: Secondary | ICD-10-CM | POA: Diagnosis not present

## 2023-10-17 DIAGNOSIS — I4891 Unspecified atrial fibrillation: Secondary | ICD-10-CM | POA: Diagnosis not present

## 2023-10-17 LAB — POCT INR: INR: 2.9 (ref 2.0–3.0)

## 2023-10-17 NOTE — Patient Instructions (Signed)
Continue warfarin 1 tablet daily except 1/2 tablet on Mondays and Thursdays.   Recheck in 6 weeks.

## 2023-10-23 ENCOUNTER — Ambulatory Visit (INDEPENDENT_AMBULATORY_CARE_PROVIDER_SITE_OTHER): Payer: Medicare HMO | Admitting: Family Medicine

## 2023-10-23 ENCOUNTER — Encounter: Payer: Self-pay | Admitting: Family Medicine

## 2023-10-23 VITALS — BP 101/65 | HR 78 | Temp 98.3°F | Wt 143.0 lb

## 2023-10-23 DIAGNOSIS — E782 Mixed hyperlipidemia: Secondary | ICD-10-CM

## 2023-10-23 DIAGNOSIS — J449 Chronic obstructive pulmonary disease, unspecified: Secondary | ICD-10-CM | POA: Diagnosis not present

## 2023-10-23 DIAGNOSIS — J3089 Other allergic rhinitis: Secondary | ICD-10-CM | POA: Diagnosis not present

## 2023-10-23 DIAGNOSIS — I10 Essential (primary) hypertension: Secondary | ICD-10-CM

## 2023-10-23 DIAGNOSIS — Z7901 Long term (current) use of anticoagulants: Secondary | ICD-10-CM | POA: Diagnosis not present

## 2023-10-23 NOTE — Progress Notes (Signed)
 Subjective:  [name], male    DOB: [birth year]  Age: 78 y.o. MRN: [id number]  CC: Medical Management of Chronic Issues   HPI [name] presents for  presents for  follow-up of hypertension. Patient has no history of headache chest pain or shortness of breath or recent cough. Patient also denies symptoms of TIA such as focal numbness or weakness. Patient denies side effects from medication. States taking it regularly.   in for follow-up of elevated cholesterol. Doing well without complaints on current medication. Denies side effects of statin including myalgia and arthralgia and nausea. Currently no chest pain, shortness of breath or other cardiovascular related symptoms noted.  Continues to use inhalers for COPD. Some DOE. Increased by current allergy sx. Meds overall effectively covering sx      06/20/2023   11:08 AM 06/20/2023   11:03 AM 05/21/2023    9:56 AM  Depression screen PHQ 2/9  Decreased Interest 2 0 3  Down, Depressed, Hopeless 0 0 0  PHQ - 2 Score 2 0 3  Altered sleeping 1  0  Tired, decreased energy 2  3  Change in appetite 1  3  Feeling bad or failure about yourself  0  0  Trouble concentrating 0  0  Moving slowly or fidgety/restless 0  0  Suicidal thoughts 0  0  PHQ-9 Score 6  9  Difficult doing work/chores Not difficult at all  Not difficult at all    History [name] has a past medical history of Anginal pain (HCC), Atrial fibrillation by electrocardiogram (HCC) (11/2019), Atrial flutter by electrocardiogram (HCC) (11/2019), Bladder cancer (HCC) (01/2020), Bladder tumor, BPH (benign prostatic hyperplasia), CAD (coronary artery disease), COPD (chronic obstructive pulmonary disease) (HCC), Dyspnea, ETOH abuse, GERD (gastroesophageal reflux disease), History of gout, Hypercholesterolemia, Hypertension, Myocardial infarction (HCC) (2013), Prostate cancer (HCC), and Tobacco abuse.   He has a past surgical history that includes Prostate surgery;  Colonoscopy; Back surgery; Esophagogastroduodenoscopy (05/27/2012); Cardiac catheterization (2011); Coronary angioplasty; Coronary artery bypass graft; Anterior cervical decompression/discectomy fusion 4 level (N/A, 07/21/2016); Colonoscopy (N/A, 11/21/2017); polypectomy (11/21/2017); Transurethral resection of bladder tumor with mitomycin-c (Left, 01/06/2020); Cardioversion (N/A, 06/10/2020); Colonoscopy with propofol (N/A, 06/09/2021); polypectomy (06/09/2021); RIGHT/LEFT HEART CATH AND CORONARY/GRAFT ANGIOGRAPHY (N/A, 03/27/2022); and CORONARY STENT INTERVENTION (N/A, 03/27/2022).   His family history includes Diabetes in his brother, brother, and mother; Healthy in his son; Heart disease in his brother and brother; Hypertension in his father; Multiple sclerosis in his daughter.He reports that he has been smoking cigarettes and e-cigarettes. He started smoking about 53 years ago. He has a 58.6 pack-year smoking history. He has never been exposed to tobacco smoke. He quit smokeless tobacco use about 11 years ago.  His smokeless tobacco use included snuff. He reports current alcohol use of about 2.0 - 4.0 standard drinks of alcohol per week. He reports that he does not use drugs.    ROS Review of Systems  Constitutional:  Negative for fever.  HENT:  Positive for rhinorrhea and sore throat (like something rubbing).   Respiratory:  Negative for shortness of breath.   Cardiovascular:  Negative for chest pain.  Musculoskeletal:  Negative for arthralgias.  Skin:  Negative for rash.    Objective:  BP 101/65   Pulse 78   Temp 98.3 F (36.8 C)   Wt 143 lb (64.9 kg)   SpO2 98%   BMI 23.08 kg/m   BP Readings from Last 3 Encounters:  10/23/23 101/65  10/04/23  128/78  08/21/23 122/72    Wt Readings from Last 3 Encounters:  10/23/23 143 lb (64.9 kg)  10/04/23 144 lb 6.4 oz (65.5 kg)  08/21/23 144 lb (65.3 kg)     Physical Exam Vitals reviewed.  Constitutional:      Appearance: He is  well-developed.  HENT:     Head: Normocephalic and atraumatic.     Right Ear: External ear normal.     Left Ear: External ear normal.     Mouth/Throat:     Mouth: Mucous membranes are moist.     Pharynx: Oropharynx is clear. No oropharyngeal exudate or posterior oropharyngeal erythema.  Eyes:     Pupils: Pupils are equal, round, and reactive to light.  Cardiovascular:     Rate and Rhythm: Normal rate and regular rhythm.     Heart sounds: No murmur heard. Pulmonary:     Effort: No respiratory distress.     Breath sounds: Normal breath sounds.  Musculoskeletal:     Cervical back: Normal range of motion and neck supple.  Neurological:     Mental Status: He is alert and oriented to person, place, and time.       Assessment & Plan:   [name] was seen today for medical management of chronic issues.  Diagnoses and all orders for this visit:  Chronic obstructive pulmonary disease, unspecified COPD type (HCC) -     CBC with Differential/Platelet -     CMP14+EGFR -     Lipid panel  Blood thinned due to long-term anticoagulant use -     CBC with Differential/Platelet -     CMP14+EGFR -     Lipid panel  Mixed hyperlipidemia -     CBC with Differential/Platelet -     CMP14+EGFR -     Lipid panel  Essential hypertension -     CBC with Differential/Platelet -     CMP14+EGFR -     Lipid panel  Non-seasonal allergic rhinitis, unspecified trigger -     Ambulatory referral to ENT       I am having [name] maintain his Vitamin D3, acetaminophen, fluticasone, B Complex Vitamins (VITAMIN B COMPLEX PO), warfarin, Phosphorus Supplement, metoprolol succinate, magnesium oxide, empagliflozin, allopurinol, albuterol, amLODipine, atorvastatin, furosemide, pantoprazole, spironolactone, nitroGLYCERIN, and Breztri Aerosphere.  Allergies as of 10/23/2023   No Known Allergies      Medication List        Accurate as of October 23, 2023 11:59 PM. If you have any questions, ask  your nurse or doctor.          acetaminophen 500 MG tablet Commonly known as: TYLENOL Take 2 tablets (1,000 mg total) by mouth every 6 (six) hours as needed for moderate pain or headache.   albuterol 108 (90 Base) MCG/ACT inhaler Commonly known as: Ventolin HFA Inhale 2 puffs into the lungs every 6 (six) hours as needed for wheezing or shortness of breath.   allopurinol 100 MG tablet Commonly known as: ZYLOPRIM Take 1 tablet (100 mg total) by mouth daily.   amLODipine 5 MG tablet Commonly known as: NORVASC Take 1 tablet (5 mg total) by mouth daily.   atorvastatin 80 MG tablet Commonly known as: LIPITOR Take 1 tablet (80 mg total) by mouth daily.   Breztri Aerosphere 160-9-4.8 MCG/ACT Aero Generic drug: budeson-glycopyrrolate-formoterol Inhale 2 puffs into the lungs in the morning and at bedtime. What changed: Another medication with the same name was removed. Continue taking this medication, and follow the  directions you see here. Changed by: Titianna Loomis   empagliflozin 10 MG Tabs tablet Commonly known as: Jardiance Take 1 tablet (10 mg total) by mouth daily before breakfast.   fluticasone 50 MCG/ACT nasal spray Commonly known as: FLONASE Place 2 sprays into both nostrils daily as needed for allergies.   furosemide 20 MG tablet Commonly known as: LASIX Take 1 tablet (20 mg total) by mouth as needed (swelling).   magnesium oxide 400 (240 Mg) MG tablet Commonly known as: MAG-OX Take 1 tablet by mouth daily.   metoprolol succinate 50 MG 24 hr tablet Commonly known as: Toprol XL Take 1.5 tablets (75 mg total) by mouth daily.   nitroGLYCERIN 0.4 MG SL tablet Commonly known as: NITROSTAT Place 1 tablet (0.4 mg total) under the tongue every 5 (five) minutes as needed for chest pain.   pantoprazole 40 MG tablet Commonly known as: PROTONIX Take 1 tablet (40 mg total) by mouth daily.   Phosphorus Supplement 280-160-250 MG Pack Take 1 packet by mouth 2 (two) times  daily. In water or juice   spironolactone 25 MG tablet Commonly known as: ALDACTONE Take 0.5 tablets (12.5 mg total) by mouth 2 (two) times daily.   VITAMIN B COMPLEX PO Take 1 tablet by mouth daily.   Vitamin D3 125 MCG (5000 UT) Caps Take 5,000 Units by mouth daily.   warfarin 5 MG tablet Commonly known as: COUMADIN Take as directed by the anticoagulation clinic. If you are unsure how to take this medication, talk to your nurse or doctor. Original instructions: Take 5 mg by mouth daily. Take 1/2 - to 1 tablet daily or as directed by coumadin clinic. Tuesday and Saturday whole tab         Follow-up: No follow-ups on file.  Mechele Claude, M.D.

## 2023-10-24 ENCOUNTER — Encounter: Payer: Self-pay | Admitting: Family Medicine

## 2023-10-24 LAB — CBC WITH DIFFERENTIAL/PLATELET
Basophils Absolute: 0.1 10*3/uL (ref 0.0–0.2)
Basos: 1 %
EOS (ABSOLUTE): 0.1 10*3/uL (ref 0.0–0.4)
Eos: 1 %
Hematocrit: 42.9 % (ref 37.5–51.0)
Hemoglobin: 14.2 g/dL (ref 13.0–17.7)
Immature Grans (Abs): 0 10*3/uL (ref 0.0–0.1)
Immature Granulocytes: 0 %
Lymphocytes Absolute: 2.5 10*3/uL (ref 0.7–3.1)
Lymphs: 32 %
MCH: 31.6 pg (ref 26.6–33.0)
MCHC: 33.1 g/dL (ref 31.5–35.7)
MCV: 95 fL (ref 79–97)
Monocytes Absolute: 0.6 10*3/uL (ref 0.1–0.9)
Monocytes: 8 %
Neutrophils Absolute: 4.6 10*3/uL (ref 1.4–7.0)
Neutrophils: 58 %
Platelets: 207 10*3/uL (ref 150–450)
RBC: 4.5 x10E6/uL (ref 4.14–5.80)
RDW: 13.9 % (ref 11.6–15.4)
WBC: 7.9 10*3/uL (ref 3.4–10.8)

## 2023-10-24 LAB — CMP14+EGFR
ALT: 28 IU/L (ref 0–44)
AST: 25 IU/L (ref 0–40)
Albumin: 4.2 g/dL (ref 3.8–4.8)
Alkaline Phosphatase: 115 IU/L (ref 44–121)
BUN/Creatinine Ratio: 12 (ref 10–24)
BUN: 14 mg/dL (ref 8–27)
Bilirubin Total: 0.3 mg/dL (ref 0.0–1.2)
CO2: 24 mmol/L (ref 20–29)
Calcium: 9.4 mg/dL (ref 8.6–10.2)
Chloride: 100 mmol/L (ref 96–106)
Creatinine, Ser: 1.15 mg/dL (ref 0.76–1.27)
Globulin, Total: 2.2 g/dL (ref 1.5–4.5)
Glucose: 76 mg/dL (ref 70–99)
Potassium: 5 mmol/L (ref 3.5–5.2)
Sodium: 137 mmol/L (ref 134–144)
Total Protein: 6.4 g/dL (ref 6.0–8.5)
eGFR: 66 mL/min/{1.73_m2} (ref >59)

## 2023-10-24 LAB — LIPID PANEL
Chol/HDL Ratio: 3.5 ratio (ref 0.0–5.0)
Cholesterol, Total: 147 mg/dL (ref 100–199)
HDL: 42 mg/dL (ref >39)
LDL Chol Calc (NIH): 81 mg/dL (ref 0–99)
Triglycerides: 138 mg/dL (ref 0–149)
VLDL Cholesterol Cal: 24 mg/dL (ref 5–40)

## 2023-10-24 NOTE — Progress Notes (Signed)
Hello Henson,  Your lab result is normal and/or stable.Some minor variations that are not significant are commonly marked abnormal, but do not represent any medical problem for you.  Best regards, Mechele Claude, M.D.

## 2023-10-30 ENCOUNTER — Telehealth: Payer: Self-pay | Admitting: Family Medicine

## 2023-10-30 NOTE — Telephone Encounter (Signed)
 Copied from CRM [id number]. Topic: Clinical - Prescription Issue >> Oct 30, 2023 10:48 AM Eunice Blase wrote: Reason for CRM: Pt's wife called regarding Budeson-Glycopyrrol-Formoterol (BREZTRI AEROSPHERE) 160-9-4.8 MCG/ACT AERO, she call phone number provided for medication assistance but only hears recording about other product. Please call Darel Hong at [phone].

## 2023-11-08 ENCOUNTER — Ambulatory Visit: Payer: Self-pay

## 2023-11-08 ENCOUNTER — Telehealth: Payer: Self-pay

## 2023-11-08 NOTE — Telephone Encounter (Signed)
 Copied from CRM [id number]. Topic: Clinical - Prescription Issue >> Nov 08, 2023  9:14 AM Ivette P wrote: Reason for CRM: Darel Hong called in to report that pt has a refill coming for Budeson-Glycopyrrol-Formoterol (BREZTRI AEROSPHERE) 160-9-4.8 MCG/ACT AERO, was told will take up to 10 days, pt does not have enough medication to last for 10 days for medication. wanted to see if sample is available to pick up at office to be able to hold pt over.    PT callback [phone]

## 2023-11-08 NOTE — Telephone Encounter (Signed)
 Left detailed message stating we have no samples at this time. LS

## 2023-11-08 NOTE — Telephone Encounter (Signed)
 2nd attempt. No answer.

## 2023-11-08 NOTE — Telephone Encounter (Signed)
 3rd attempt to contact patient/ wife regarding medication Chief Complaint: medication problem Symptoms: na  Frequency: na Pertinent Negatives: Patient denies na Disposition: [] ED /[] Urgent Care (no appt availability in office) / [] Appointment(In office/virtual)/ []  [place] [facility]/ [] Home Care/ [] Refused Recommended Disposition /[] [facility]/ [x]  Follow-up with PCP Additional Notes:   No success to reach patient or wife regarding medication questions , what to do when patient runs out of medication Breztri. Please advise .   Called patient no answer, LVMTCB     Reason for Disposition  Third attempt to contact caller AND no contact made. Phone number verified.  Answer Assessment - Initial Assessment Questions N/A Patient's wife called to request what to do when patient runs out of medication Budeson- Glycopyrrol-Formoterol (Breztri Aerosphere) 160-9-4.8 mcg/act aero . Medication just sent from mail delivery pharmacy and is 10 days out and practice has no more samples.  Protocols used: No Contact or Duplicate Contact Call-A-AH

## 2023-11-08 NOTE — Telephone Encounter (Signed)
 Spoke to patients wife per signed DPR.  Informed sample is available for patient up in front office.

## 2023-11-08 NOTE — Telephone Encounter (Signed)
 1st attempt, left voicemail to return call. Need to find out how many doses are left on dose counter for [employer].   Copied from CRM [id number]. Topic: Clinical - Medical Advice >> Nov 08, 2023 11:37 AM Higinio Roger wrote: Reason for CRM: Patient's wife, Manu, Rubey is wondering what patient should do when he runs out of Budeson-Glycopyrrol-Formoterol (BREZTRI AEROSPHERE) 160-9-4.8 MCG/ACT AERO. The clinic stated they do not have any samples. The mail order from the pharmacy stated the medication will take 10 business days and it was just mailed out yesterday, 11/07/23  Callback #: [phone]

## 2023-11-28 ENCOUNTER — Ambulatory Visit: Attending: Cardiology | Admitting: *Deleted

## 2023-11-28 DIAGNOSIS — I4891 Unspecified atrial fibrillation: Secondary | ICD-10-CM | POA: Diagnosis not present

## 2023-11-28 DIAGNOSIS — Z5181 Encounter for therapeutic drug level monitoring: Secondary | ICD-10-CM | POA: Diagnosis not present

## 2023-11-28 LAB — POCT INR: INR: 3.7 — AB (ref 2.0–3.0)

## 2023-11-28 NOTE — Patient Instructions (Signed)
 Hold warfarin tonight then resume 1 tablet daily except 1/2 tablet on Mondays and Thursdays Recheck in 3 weeks

## 2023-12-05 ENCOUNTER — Ambulatory Visit: Payer: Medicare HMO

## 2023-12-05 VITALS — BP 101/65 | HR 78 | Ht 66.0 in | Wt 143.0 lb

## 2023-12-05 DIAGNOSIS — Z0000 Encounter for general adult medical examination without abnormal findings: Secondary | ICD-10-CM

## 2023-12-05 NOTE — Patient Instructions (Signed)
 Mr. [name] , Thank you for taking time to come for your Medicare Wellness Visit. I appreciate your ongoing commitment to your health goals. Please review the following plan we discussed and let me know if I can assist you in the future.   Referrals/Orders/Follow-Ups/Clinician Recommendations: n/a  This is a list of the screening recommended for you and due dates:  Health Maintenance  Topic Date Due   COVID-19 Vaccine (4 - 2024-25 season) 12/20/2024*   Flu Shot  03/07/2024   Screening for Lung Cancer  04/12/2024   Medicare Annual Wellness Visit  12/04/2024   Colon Cancer Screening  06/09/2026   DTaP/Tdap/Td vaccine (2 - Td or Tdap) 05/26/2030   Pneumonia Vaccine  Completed   Hepatitis C Screening  Completed   Zoster (Shingles) Vaccine  Completed   HPV Vaccine  Aged Out   Meningitis B Vaccine  Aged Out  *Topic was postponed. The date shown is not the original due date.    Advanced directives: (Declined) Advance directive discussed with you today. Even though you declined this today, please call our office should you change your mind, and we can give you the proper paperwork for you to fill out.  Next Medicare Annual Wellness Visit scheduled for next year: Yes

## 2023-12-05 NOTE — Progress Notes (Signed)
 Subjective:   [name] is a 78 y.o. who presents for a Medicare Wellness preventive visit.  Visit Complete: Virtual I connected with  Sharma Dears on 12/05/23 by a audio enabled telemedicine application and verified that I am speaking with the correct person using two identifiers.  Patient Location: Home  Provider Location: Home Office  I discussed the limitations of evaluation and management by telemedicine. The patient expressed understanding and agreed to proceed.  Vital Signs: Because this visit was a virtual/telehealth visit, some criteria may be missing or patient reported. Any vitals not documented were not able to be obtained and vitals that have been documented are patient reported.  VideoDeclined- This patient declined Librarian, academic. Therefore the visit was completed with audio only.  Persons Participating in Visit: Patient.  AWV Questionnaire: No: Patient Medicare AWV questionnaire was not completed prior to this visit.  Cardiac Risk Factors include: advanced age (>14men, >98 women);male gender;dyslipidemia;hypertension;smoking/ tobacco exposure (BRADYCARDIA , COPD)     Objective:    Today's Vitals   12/05/23 1003  BP: 101/65  Pulse: 78  Weight: 143 lb (64.9 kg)  Height: 5\' 6"  (1.676 m)   Body mass index is 23.08 kg/m.     12/05/2023   10:07 AM 05/15/2023    2:03 AM 05/14/2023    4:06 PM 11/30/2022   10:40 AM 03/26/2022    4:35 PM 03/26/2022    3:54 PM 03/25/2022    7:07 PM  Advanced Directives  Does Patient Have a Medical Advance Directive? No  No No  No No  Would patient like information on creating a medical advance directive?  No - Patient declined  Yes (Inpatient - patient defers creating a medical advance directive and declines information at this time) No - Patient declined      Current Medications (verified) Outpatient Encounter Medications as of 12/05/2023  Medication Sig   acetaminophen  (TYLENOL ) 500 MG tablet  Take 2 tablets (1,000 mg total) by mouth every 6 (six) hours as needed for moderate pain or headache.   albuterol  (VENTOLIN  HFA) 108 (90 Base) MCG/ACT inhaler Inhale 2 puffs into the lungs every 6 (six) hours as needed for wheezing or shortness of breath.   allopurinol  (ZYLOPRIM ) 100 MG tablet Take 1 tablet (100 mg total) by mouth daily.   amLODipine  (NORVASC ) 5 MG tablet Take 1 tablet (5 mg total) by mouth daily.   atorvastatin  (LIPITOR ) 80 MG tablet Take 1 tablet (80 mg total) by mouth daily.   B Complex Vitamins (VITAMIN B COMPLEX PO) Take 1 tablet by mouth daily.   Budeson-Glycopyrrol-Formoterol  (BREZTRI  AEROSPHERE) 160-9-4.8 MCG/ACT AERO Inhale 2 puffs into the lungs in the morning and at bedtime.   Cholecalciferol (VITAMIN D3) 125 MCG (5000 UT) CAPS Take 5,000 Units by mouth daily.   empagliflozin  (JARDIANCE ) 10 MG TABS tablet Take 1 tablet (10 mg total) by mouth daily before breakfast.   fluticasone  (FLONASE ) 50 MCG/ACT nasal spray Place 2 sprays into both nostrils daily as needed for allergies.   furosemide  (LASIX ) 20 MG tablet Take 1 tablet (20 mg total) by mouth as needed (swelling).   magnesium  oxide (MAG-OX) 400 (240 Mg) MG tablet Take 1 tablet by mouth daily.   metoprolol  succinate (TOPROL  XL) 50 MG 24 hr tablet Take 1.5 tablets (75 mg total) by mouth daily.   nitroGLYCERIN  (NITROSTAT ) 0.4 MG SL tablet Place 1 tablet (0.4 mg total) under the tongue every 5 (five) minutes as needed for chest pain.  pantoprazole  (PROTONIX ) 40 MG tablet Take 1 tablet (40 mg total) by mouth daily.   Potassium & Sodium Phosphates (PHOSPHORUS  SUPPLEMENT) 280-160-250 MG PACK Take 1 packet by mouth 2 (two) times daily. In water  or juice   spironolactone  (ALDACTONE ) 25 MG tablet Take 0.5 tablets (12.5 mg total) by mouth 2 (two) times daily.   warfarin (COUMADIN ) 5 MG tablet Take 5 mg by mouth daily. Take 1/2 - to 1 tablet daily or as directed by coumadin  clinic. Tuesday and Saturday whole tab   No  facility-administered encounter medications on file as of 12/05/2023.    Allergies (verified) Patient has no known allergies.   History: Past Medical History:  Diagnosis Date   Anginal pain (HCC)    Atrial fibrillation by electrocardiogram (HCC) 11/2019   Atrial flutter by electrocardiogram [facility]) 11/2019   Bladder cancer (HCC) 01/2020   Bladder tumor    BPH (benign prostatic hyperplasia)    CAD (coronary artery disease)    a. s/p INF MI 1997;  b. s/p CABG 2001;  c. LHC 11/2009:  3v CAD, S-PDA ok with 40% mid, S-OM ok, S-Dx ok, L-LAD ok, EF 50%;  d.  Lex MV 5/14:  Inferolateral scar, EF 46%, no ischemia   COPD (chronic obstructive pulmonary disease) (HCC)    Dyspnea    ETOH abuse    GERD (gastroesophageal reflux disease)    History of gout    Hypercholesterolemia    Hypertension    Myocardial infarction [facility]) 2013   Prostate cancer (HCC)    Tobacco abuse    Past Surgical History:  Procedure Laterality Date   ANTERIOR CERVICAL DECOMPRESSION/DISCECTOMY FUSION 4 LEVELS N/A 07/21/2016   Procedure: ANTERIOR CERVICAL DECOMPRESSION/DISCECTOMY FUSION CERVICAL TWO-THREE, CERVICAL THREE-FOUR,. CERVICAL FOUR-FIVE, CERVICAL  FIVE-SIX;  Surgeon: Audie Bleacher, MD;  Location: [facility] OR;  Service: Neurosurgery;  Laterality: N/A;   BACK SURGERY     CARDIAC CATHETERIZATION  2011   CARDIOVERSION N/A 06/10/2020   Procedure: CARDIOVERSION;  Surgeon: Laurann Pollock, MD;  Location: [facility] SUITE;  Service: Endoscopy;  Laterality: N/A;   COLONOSCOPY     in remote past, Dr. Adan Holms. Obtaining records.    COLONOSCOPY N/A 11/21/2017   Surgeon: Suzette Espy, MD; Three 4-6 mm polyps in the rectum and splenic flexure resected and retrieved (tubular adenomas), nonbleeding internal hemorrhoids.  Repeat in 5 years.   COLONOSCOPY WITH PROPOFOL  N/A 06/09/2021   Surgeon: Suzette Espy, MD;  Three 3-7 mm polyps in the ascending colon resected and retrieved, mild radiation proctitis, otherwise normal exam.  Pathology revealed tubular adenomas.  Recommended repeat colonoscopy in 5 years.   CORONARY ANGIOPLASTY     CORONARY ARTERY BYPASS GRAFT     CORONARY STENT INTERVENTION N/A 03/27/2022   Procedure: CORONARY STENT INTERVENTION;  Surgeon: Swaziland, Peter M, MD;  Location: [facility] INVASIVE CV LAB;  Service: Cardiovascular;  Laterality: N/A;   ESOPHAGOGASTRODUODENOSCOPY  05/27/2012   BJY:NWGNFA esophagus, stomach and duodenum s/p dilator   POLYPECTOMY  11/21/2017   Procedure: POLYPECTOMY;  Surgeon: Suzette Espy, MD;  Location: [facility];  Service: Endoscopy;;  colon   POLYPECTOMY  06/09/2021   Procedure: POLYPECTOMY;  Surgeon: Suzette Espy, MD;  Location: [facility];  Service: Endoscopy;;   PROSTATE SURGERY     RIGHT/LEFT HEART CATH AND CORONARY/GRAFT ANGIOGRAPHY N/A 03/27/2022   Procedure: RIGHT/LEFT HEART CATH AND CORONARY/GRAFT ANGIOGRAPHY;  Surgeon: Swaziland, Peter M, MD;  Location: [facility] INVASIVE CV LAB;  Service: Cardiovascular;  Laterality: N/A;  TRANSURETHRAL RESECTION OF BLADDER TUMOR WITH MITOMYCIN -C Left 01/06/2020   Procedure: CYSTOSCOPY TRANSURETHRAL RESECTION OF BLADDER TUMOR,LEFT URETERSCOPY AND GEMCITABINE ;  Surgeon: Homero Luster, MD;  Location: [facility] ORS;  Service: Urology;  Laterality: Left;   Family History  Problem Relation Age of Onset   Diabetes Mother    Hypertension Father    Heart disease Brother    Diabetes Brother    Heart disease Brother    Diabetes Brother    Multiple sclerosis Daughter    Healthy Son    Colon cancer Neg Hx    Inflammatory bowel disease Neg Hx    Social History   Socioeconomic History   Marital status: Married    Spouse name: Not on file   Number of children: 2   Years of education: Not on file   Highest education level: Not on file  Occupational History    Comment: retired  Tobacco Use   Smoking status: Every Day    Current packs/day: 1.00    Average packs/day: 1 pack/day for 58.7 years (58.7 ttl pk-yrs)    Types: Cigarettes,  E-cigarettes    Start date: 03/08/1970    Passive exposure: Never   Smokeless tobacco: Former    Types: Snuff    Quit date: 03/08/2012   Tobacco comments:    10 - 15 cigarettes smoked daily ARJ 01/19/23  Vaping Use   Vaping status: Former  Substance and Sexual Activity   Alcohol use: Yes    Alcohol/week: 2.0 - 4.0 standard drinks of alcohol    Types: 2 - 4 Cans of beer per week    Comment: 2-4 beers per day   Drug use: No   Sexual activity: Yes    Partners: Female  Other Topics Concern   Not on file  Social History Narrative   His brother lives with him and his wife. Daughter lives in [place], son lives nearby.   Social Drivers of Corporate investment banker Strain: Low Risk  (12/05/2023)   Overall Financial Resource Strain (CARDIA)    Difficulty of Paying Living Expenses: Not hard at all  Food Insecurity: No Food Insecurity (12/05/2023)   Hunger Vital Sign    Worried About Running Out of Food in the Last Year: Never true    Ran Out of Food in the Last Year: Never true  Transportation Needs: No Transportation Needs (12/05/2023)   PRAPARE - Administrator, Civil Service (Medical): No    Lack of Transportation (Non-Medical): No  Physical Activity: Unknown (12/05/2023)   Exercise Vital Sign    Days of Exercise per Week: Not on file    Minutes of Exercise per Session: Patient declined  Stress: No Stress Concern Present (12/05/2023)   [employer] of Occupational Health - Occupational Stress Questionnaire    Feeling of Stress : Only a little  Social Connections: Socially Isolated (12/05/2023)   Social Connection and Isolation Panel [NHANES]    Frequency of Communication with Friends and Family: Once a week    Frequency of Social Gatherings with Friends and Family: Once a week    Attends Religious Services: Never    Database administrator or Organizations: No    Attends Engineer, structural: Never    Marital Status: Married    Tobacco Counseling Ready to quit:  No Counseling given: Yes Tobacco comments: 10 - 15 cigarettes smoked daily ARJ 01/19/23    Clinical Intake:  Pre-visit preparation completed: Yes  Pain : No/denies pain  BMI - recorded: 23.08 Nutritional Status: BMI of 19-24  Normal Nutritional Risks: None Diabetes: No  Lab Results  Component Value Date   HGBA1C 5.5 10/29/2015   HGBA1C 5.9 (H) 03/12/2015     How often do you need to have someone help you when you read instructions, pamphlets, or other written materials from your doctor or pharmacy?: 5 - Always (pt's wife help with reading materials)  Interpreter Needed?: No  Information entered by :: Alia t/cma   Activities of Daily Living     12/05/2023   10:05 AM 05/15/2023    1:46 AM  In your present state of health, do you have any difficulty performing the following activities:  Hearing? 1   Comment pt uses hearing   Vision? 0   Difficulty concentrating or making decisions? 0   Walking or climbing stairs? 0   Dressing or bathing? 0   Doing errands, shopping? 1 1  Comment pt's wife takes pt to the dr office   Preparing Food and eating ? N   Using the Toilet? N   In the past six months, have you accidently leaked urine? Y   Do you have problems with loss of bowel control? N   Managing your Medications? N   Managing your Finances? N   Housekeeping or managing your Housekeeping? N     Patient Care Team: Roise Cleaver, MD as PCP - General (Family Medicine) Amanda Jungling, Joyceann No, MD as PCP - Cardiology (Cardiology) Riley Cheadle Windsor Hatcher, MD (Gastroenterology) Amanda Jungling Joyceann No, MD as Consulting Physician (Cardiology) Hyland Mailman, MD as Referring Physician (Optometry) Alpheus Arvin, Donata Fryer, [facility] as Pharmacist (Family Medicine) Denson Flake, MD as Consulting Physician (Pulmonary Disease) Homero Luster, MD as Attending Physician (Urology) Gerard Knight, MD as Consulting Physician (Cardiology)  Indicate any recent Medical Services you may have received from  other than [facility] providers in the past year (date may be approximate).     Assessment:   This is a routine wellness examination for Estus.  Hearing/Vision screen Hearing Screening - Comments:: Pt uses hearing aids Vision Screening - Comments:: Pt denies vision, it has been a little since last vision exam, if need to pt goes to [facility] center in [place], [place]   Goals Addressed             This Visit's Progress    DIET - EAT MORE FRUITS AND VEGETABLES   On track    Have 3 meals a day   On track      Depression Screen     12/05/2023   10:09 AM 06/20/2023   11:08 AM 06/20/2023   11:03 AM 05/21/2023    9:56 AM 05/21/2023    9:13 AM 11/30/2022    1:14 PM 11/30/2022    1:07 PM  PHQ 2/9 Scores  PHQ - 2 Score 0 2 0 3 0 3 0  PHQ- 9 Score 0 6  9  6      Fall Risk     12/05/2023   10:09 AM 06/20/2023   11:03 AM 05/21/2023    9:17 AM 05/21/2023    9:13 AM 11/30/2022    1:07 PM  Fall Risk   Falls in the past year? 0 1 1 0 0  Number falls in past yr: 0 1 1    Injury with Fall? 0 1 1    Risk for fall due to : No Fall Risks History of fall(s) Other (Comment)    Risk for fall  due to: Comment   syncope episodes    Follow up Falls prevention discussed;Falls evaluation completed Falls evaluation completed Falls evaluation completed      MEDICARE RISK AT HOME:  Medicare Risk at Home Any stairs in or around the home?: Yes If so, are there any without handrails?: Yes Home free of loose throw rugs in walkways, pet beds, electrical cords, etc?: Yes Adequate lighting in your home to reduce risk of falls?: Yes Life alert?: No Use of a cane, walker or w/c?: No Grab bars in the bathroom?: No Shower chair or bench in shower?: No Elevated toilet seat or a handicapped toilet?: No  TIMED UP AND GO:  Was the test performed?  no  Cognitive Function: 6CIT completed    04/24/2018    3:40 PM 01/19/2017    3:40 PM  MMSE - Mini Mental State Exam  Not completed:  Unable to complete   Orientation to time 5 5  Orientation to Place 5   Registration 3   Attention/ Calculation 0   Recall 3   Language- name 2 objects 2   Language- repeat 1   Language- follow 3 step command 3   Language- read & follow direction 0   Write a sentence 0   Copy design 1   Total score 23         12/05/2023   10:17 AM 11/30/2022   10:37 AM 11/28/2021   12:05 PM 11/26/2019   11:24 AM  6CIT Screen  What Year? 0 points 0 points 0 points 0 points  What month? 0 points 0 points 0 points 0 points  What time? 0 points 0 points 0 points 0 points  Count back from 20 0 points 0 points 0 points 0 points  Months in reverse 4 points 4 points 4 points 4 points  Repeat phrase 4 points 4 points 2 points 8 points  Total Score 8 points 8 points 6 points 12 points    Immunizations Immunization History  Administered Date(s) Administered   Fluad Quad(high Dose 65+) 05/12/2019, 05/26/2020, 05/30/2021, 05/30/2022   Fluad Trivalent(High Dose 65+) 05/21/2023   Influenza, High Dose Seasonal PF 06/25/2018   Influenza,inj,Quad PF,6+ Mos 05/08/2016, 07/23/2017   Moderna Sars-Covid-2 Vaccination 11/06/2019, 12/04/2019, 07/20/2020   Pneumococcal Conjugate-13 01/19/2017   Pneumococcal Polysaccharide-23 11/27/2012   Tdap 05/26/2020   Zoster Recombinant(Shingrix ) 11/28/2021, 05/30/2022    Screening Tests Health Maintenance  Topic Date Due   COVID-19 Vaccine (4 - 2024-25 season) 12/20/2024 (Originally 04/08/2023)   INFLUENZA VACCINE  03/07/2024   Lung Cancer Screening  04/12/2024   Medicare Annual Wellness (AWV)  12/04/2024   Colonoscopy  06/09/2026   DTaP/Tdap/Td (2 - Td or Tdap) 05/26/2030   Pneumonia Vaccine 63+ Years old  Completed   Hepatitis C Screening  Completed   Zoster Vaccines- Shingrix   Completed   HPV VACCINES  Aged Out   Meningococcal B Vaccine  Aged Out    Health Maintenance  There are no preventive care reminders to display for this patient. Health Maintenance Items Addressed: See  Nurse Notes  Additional Screening:  Vision Screening: Recommended annual ophthalmology exams for early detection of glaucoma and other disorders of the eye.  Dental Screening: Recommended annual dental exams for proper oral hygiene  Community Resource Referral / Chronic Care Management: CRR required this visit?  No   CCM required this visit?  No     Plan:     I have personally reviewed and noted the following in the patient's  chart:   Medical and social history Use of alcohol, tobacco or illicit drugs  Current medications and supplements including opioid prescriptions. Patient is not currently taking opioid prescriptions. Functional ability and status Nutritional status Physical activity Advanced directives List of other physicians Hospitalizations, surgeries, and ER visits in previous 12 months Vitals Screenings to include cognitive, depression, and falls Referrals and appointments  In addition, I have reviewed and discussed with patient certain preventive protocols, quality metrics, and best practice recommendations. A written personalized care plan for preventive services as well as general preventive health recommendations were provided to patient.     Michaelle Adolphus, CMA   12/05/2023   After Visit Summary: (MyChart) Due to this being a telephonic visit, the after visit summary with patients personalized plan was offered to patient via MyChart   Notes: Pt score a 8 on 6CIT

## 2023-12-19 ENCOUNTER — Ambulatory Visit: Attending: Cardiology | Admitting: *Deleted

## 2023-12-19 DIAGNOSIS — I4891 Unspecified atrial fibrillation: Secondary | ICD-10-CM | POA: Diagnosis not present

## 2023-12-19 DIAGNOSIS — Z5181 Encounter for therapeutic drug level monitoring: Secondary | ICD-10-CM

## 2023-12-19 LAB — POCT INR: INR: 3.1 — AB (ref 2.0–3.0)

## 2023-12-19 NOTE — Patient Instructions (Signed)
 Take warfarin 1/2 tablet tonight then resume 1 tablet daily except 1/2 tablet on Mondays and Thursdays Recheck in 4 weeks

## 2024-01-10 ENCOUNTER — Ambulatory Visit (INDEPENDENT_AMBULATORY_CARE_PROVIDER_SITE_OTHER): Admitting: Otolaryngology

## 2024-01-10 ENCOUNTER — Encounter (INDEPENDENT_AMBULATORY_CARE_PROVIDER_SITE_OTHER): Payer: Self-pay | Admitting: Otolaryngology

## 2024-01-10 VITALS — BP 115/68 | HR 86 | Ht 66.0 in | Wt 149.0 lb

## 2024-01-10 DIAGNOSIS — R1314 Dysphagia, pharyngoesophageal phase: Secondary | ICD-10-CM

## 2024-01-10 DIAGNOSIS — R09A2 Foreign body sensation, throat: Secondary | ICD-10-CM

## 2024-01-10 DIAGNOSIS — L989 Disorder of the skin and subcutaneous tissue, unspecified: Secondary | ICD-10-CM | POA: Diagnosis not present

## 2024-01-10 DIAGNOSIS — F17210 Nicotine dependence, cigarettes, uncomplicated: Secondary | ICD-10-CM | POA: Diagnosis not present

## 2024-01-10 DIAGNOSIS — R0982 Postnasal drip: Secondary | ICD-10-CM | POA: Diagnosis not present

## 2024-01-10 DIAGNOSIS — B379 Candidiasis, unspecified: Secondary | ICD-10-CM

## 2024-01-10 DIAGNOSIS — R053 Chronic cough: Secondary | ICD-10-CM

## 2024-01-10 DIAGNOSIS — F17200 Nicotine dependence, unspecified, uncomplicated: Secondary | ICD-10-CM

## 2024-01-10 DIAGNOSIS — R0981 Nasal congestion: Secondary | ICD-10-CM

## 2024-01-10 MED ORDER — FLUCONAZOLE 150 MG PO TABS: 150.0000 mg | ORAL_TABLET | Freq: Every day | ORAL | 0 refills | Status: AC

## 2024-01-10 MED ORDER — AZELASTINE HCL 0.1 % NA SOLN: 2.0000 | Freq: Two times a day (BID) | NASAL | 12 refills | Status: AC

## 2024-01-10 MED ORDER — NYSTATIN 100000 UNIT/ML MT SUSP: 5.0000 mL | Freq: Four times a day (QID) | OROMUCOSAL | 0 refills | Status: AC

## 2024-01-10 NOTE — Progress Notes (Signed)
 Dear Dr. Veleta Gerold, Here is my assessment for our mutual patient, [name]. Thank you for allowing me the opportunity to care for your patient. Please do not hesitate to contact me should you have any other questions. Sincerely, Dr. Milon Aloe  Otolaryngology Clinic Note Referring provider: Dr. Veleta Gerold HPI:  [name] is a 78 y.o. male kindly referred by Dr. Veleta Gerold for evaluation of post nasal drip, cough, and dysphagia.  Initial visit (01/2024): Patient reports: ongoing post nasal drip and feels like there is something "hung up" in his throat - things feel as if they are sticking in his throat ("lump in the throat"). This is constant, not just when he eats. He also generally has these symptoms worst first thing in the morning when he has to cough up mucus in the morning. During the day, he is generally not coughing and does not have significant phlegm. Drainage is mucoid, not purulent or green. Ongoing for 3-4 years. Some dysphagia with liquids more so than solids with intermittent aspiration. Does not regurgitate. Patient otherwise denies: -  odynophagia, aspiration episodes or PNA, need for Heimlich, unintentional weight loss - changes in voice, worsening shortness of breath, hemoptysis  - ear pain, neck masses No facial pressure/pain, hyposmia. Denies frequent sinus infections. No seasonal allergies. Uses flonase  intermittently, no other spray use. Mild nasal congestion b/l  H&N Surgery: ACDF Personal or FHx of bleeding dz or anesthesia difficulty: no  GLP-1: no AP/AC: Warfarin  Tobacco: current use (~1 PPD - 60 pack year). Alcohol: prior 12 pack of beer/day, quit Oct 2024  PMHx: COPD, HLD, HTN, A-fib, CABG, Prostate and bladder Ca, CHF  Independent Review of Additional Tests or Records:  Dr. Veleta Gerold (10/23/2023): noted symptoms of rhinitis; Dx: Rhinitis; Rx: ref to ENT Dr. Baldwin Levee (10/04/2023) Pulm: noted COPD; exertional SOB, morning cough and clear phlegm; Dx: Cough and COPD; Rx:  continue breztri  CBC and CMP 10/23/2023: Eos 100; WBC 7.9; BUN/Cr 14/1.15 CTH 05/14/2023 independently interpreted with respect to sinuses: left septal deviation, paranasal sinuses otherwise clear PMH/Meds/All/SocHx/FamHx/ROS:   Past Medical History:  Diagnosis Date   Anginal pain (HCC)    Atrial fibrillation by electrocardiogram (HCC) 11/2019   Atrial flutter by electrocardiogram (HCC) 11/2019   Bladder cancer (HCC) 01/2020   Bladder tumor    BPH (benign prostatic hyperplasia)    CAD (coronary artery disease)    a. s/p INF MI 1997;  b. s/p CABG 2001;  c. LHC 11/2009:  3v CAD, S-PDA ok with 40% mid, S-OM ok, S-Dx ok, L-LAD ok, EF 50%;  d.  Lex MV 5/14:  Inferolateral scar, EF 46%, no ischemia   COPD (chronic obstructive pulmonary disease) (HCC)    Dyspnea    ETOH abuse    GERD (gastroesophageal reflux disease)    History of gout    Hypercholesterolemia    Hypertension    Myocardial infarction [facility]) 2013   Prostate cancer (HCC)    Tobacco abuse      Past Surgical History:  Procedure Laterality Date   ANTERIOR CERVICAL DECOMPRESSION/DISCECTOMY FUSION 4 LEVELS N/A 07/21/2016   Procedure: ANTERIOR CERVICAL DECOMPRESSION/DISCECTOMY FUSION CERVICAL TWO-THREE, CERVICAL THREE-FOUR,. CERVICAL FOUR-FIVE, CERVICAL  FIVE-SIX;  Surgeon: Audie Bleacher, MD;  Location: [facility];  Service: Neurosurgery;  Laterality: N/A;   BACK SURGERY     CARDIAC CATHETERIZATION  2011   CARDIOVERSION N/A 06/10/2020   Procedure: CARDIOVERSION;  Surgeon: Laurann Pollock, MD;  [facility];  Service: Endoscopy;  Laterality: N/A;   COLONOSCOPY  in remote past, Dr. Adan Holms. Obtaining records.    COLONOSCOPY N/A 11/21/2017   Surgeon: Suzette Espy, MD; Three 4-6 mm polyps in the rectum and splenic flexure resected and retrieved (tubular adenomas), nonbleeding internal hemorrhoids.  Repeat in 5 years.   COLONOSCOPY WITH PROPOFOL  N/A 06/09/2021   Surgeon: Suzette Espy, MD;  Three 3-7 mm polyps in the  ascending colon resected and retrieved, mild radiation proctitis, otherwise normal exam. Pathology revealed tubular adenomas.  Recommended repeat colonoscopy in 5 years.   CORONARY ANGIOPLASTY     CORONARY ARTERY BYPASS GRAFT     CORONARY STENT INTERVENTION N/A 03/27/2022   Procedure: CORONARY STENT INTERVENTION;  Surgeon: Swaziland, Peter M, MD;  Location: [facility] INVASIVE CV LAB;  Service: Cardiovascular;  Laterality: N/A;   ESOPHAGOGASTRODUODENOSCOPY  05/27/2012   VHQ:IONGEX esophagus, stomach and duodenum s/p dilator   POLYPECTOMY  11/21/2017   Procedure: POLYPECTOMY;  Surgeon: Suzette Espy, MD;  Location: [facility];  Service: Endoscopy;;  colon   POLYPECTOMY  06/09/2021   Procedure: POLYPECTOMY;  Surgeon: Suzette Espy, MD;  [facility]: [facility];  Service: Endoscopy;;   PROSTATE SURGERY     RIGHT/LEFT HEART CATH AND CORONARY/GRAFT ANGIOGRAPHY N/A 03/27/2022   Procedure: RIGHT/LEFT HEART CATH AND CORONARY/GRAFT ANGIOGRAPHY;  Surgeon: Swaziland, Peter M, MD;  Location: [facility] INVASIVE CV LAB;  Service: Cardiovascular;  Laterality: N/A;   TRANSURETHRAL RESECTION OF BLADDER TUMOR WITH MITOMYCIN -C Left 01/06/2020   Procedure: CYSTOSCOPY TRANSURETHRAL RESECTION OF BLADDER TUMOR,LEFT URETERSCOPY AND GEMCITABINE ;  Surgeon: Homero Luster, MD;  Location: [facility] ORS;  Service: Urology;  Laterality: Left;    Family History  Problem Relation Age of Onset   Diabetes Mother    Hypertension Father    Heart disease Brother    Diabetes Brother    Heart disease Brother    Diabetes Brother    Multiple sclerosis Daughter    Healthy Son    Colon cancer Neg Hx    Inflammatory bowel disease Neg Hx      Social Connections: Socially Isolated (12/05/2023)   Social Connection and Isolation Panel [NHANES]    Frequency of Communication with Friends and Family: Once a week    Frequency of Social Gatherings with Friends and Family: Once a week    Attends Religious Services: Never    Database administrator or  Organizations: No    Attends Engineer, structural: Never    Marital Status: Married      Current Outpatient Medications:    acetaminophen  (TYLENOL ) 500 MG tablet, Take 2 tablets (1,000 mg total) by mouth every 6 (six) hours as needed for moderate pain or headache., Disp: 90 tablet, Rfl: 0   albuterol  (VENTOLIN  HFA) 108 (90 Base) MCG/ACT inhaler, Inhale 2 puffs into the lungs every 6 (six) hours as needed for wheezing or shortness of breath., Disp: 3 each, Rfl: 3   allopurinol  (ZYLOPRIM ) 100 MG tablet, Take 1 tablet (100 mg total) by mouth daily., Disp: 90 tablet, Rfl: 3   amLODipine  (NORVASC ) 5 MG tablet, Take 1 tablet (5 mg total) by mouth daily., Disp: 90 tablet, Rfl: 1   atorvastatin  (LIPITOR ) 80 MG tablet, Take 1 tablet (80 mg total) by mouth daily., Disp: 90 tablet, Rfl: 1   azelastine (ASTELIN) 0.1 % nasal spray, Place 2 sprays into both nostrils 2 (two) times daily. Use in each nostril as directed, Disp: 30 mL, Rfl: 12   B Complex Vitamins (VITAMIN B COMPLEX PO), Take 1 tablet by mouth  daily., Disp: , Rfl:    Budeson-Glycopyrrol-Formoterol  (BREZTRI  AEROSPHERE) 160-9-4.8 MCG/ACT AERO, Inhale 2 puffs into the lungs in the morning and at bedtime., Disp: , Rfl:    Cholecalciferol (VITAMIN D3) 125 MCG (5000 UT) CAPS, Take 5,000 Units by mouth daily., Disp: , Rfl:    empagliflozin  (JARDIANCE ) 10 MG TABS tablet, Take 1 tablet (10 mg total) by mouth daily before breakfast., Disp: 21 tablet, Rfl: 0   fluconazole (DIFLUCAN) 150 MG tablet, Take 1 tablet (150 mg total) by mouth daily for 7 days., Disp: 7 tablet, Rfl: 0   fluticasone  (FLONASE ) 50 MCG/ACT nasal spray, Place 2 sprays into both nostrils daily as needed for allergies., Disp: 48 g, Rfl: 2   furosemide  (LASIX ) 20 MG tablet, Take 1 tablet (20 mg total) by mouth as needed (swelling)., Disp: 90 tablet, Rfl: 1   magnesium  oxide (MAG-OX) 400 (240 Mg) MG tablet, Take 1 tablet by mouth daily., Disp: , Rfl:    metoprolol  succinate (TOPROL   XL) 50 MG 24 hr tablet, Take 1.5 tablets (75 mg total) by mouth daily., Disp: 135 tablet, Rfl: 1   nitroGLYCERIN  (NITROSTAT ) 0.4 MG SL tablet, Place 1 tablet (0.4 mg total) under the tongue every 5 (five) minutes as needed for chest pain., Disp: 25 tablet, Rfl: 2   nystatin (MYCOSTATIN) 100000 UNIT/ML suspension, Take 5 mLs (500,000 Units total) by mouth 4 (four) times daily for 14 days., Disp: 400 mL, Rfl: 0   pantoprazole  (PROTONIX ) 40 MG tablet, Take 1 tablet (40 mg total) by mouth daily., Disp: 90 tablet, Rfl: 1   Potassium & Sodium Phosphates (PHOSPHORUS  SUPPLEMENT) 280-160-250 MG PACK, Take 1 packet by mouth 2 (two) times daily. In water  or juice, Disp: 180 each, Rfl: 1   spironolactone  (ALDACTONE ) 25 MG tablet, Take 0.5 tablets (12.5 mg total) by mouth 2 (two) times daily., Disp: 90 tablet, Rfl: 1   warfarin (COUMADIN ) 5 MG tablet, Take 5 mg by mouth daily. Take 1/2 - to 1 tablet daily or as directed by coumadin  clinic. Tuesday and Saturday whole tab, Disp: , Rfl:    Physical Exam:   BP 115/68   Pulse 86   Ht 5\' 6"  (1.676 m)   Wt 149 lb (67.6 kg)   SpO2 98%   BMI 24.05 kg/m   Salient findings:  CN II-XII intact  Bilateral EAC clear and TM intact with well pneumatized middle ear spaces Anterior rhinoscopy: Septum relatively midline; bilateral inferior turbinates without significant hypertrophy; Nasal endoscopy was indicated to better evaluate the nose and paranasal sinuses, given the patient's history and exam findings, and is detailed below. No lesions of oral cavity/oropharynx; edentulous; trace soft palate white plaques that are able to be scraped off consistent with candidate No obviously palpable neck masses/lymphadenopathy/thyromegaly No respiratory distress or stridor; TFL was indicated to better evaluate the proximal airway, given the patient's history and exam findings, and is detailed below. White plaque over just to left midline nasal dorsum with some hyperpigmentation  surrounding  Seprately Identifiable Procedures:  Prior to initiating any procedures, risks/benefits/alternatives were explained to the patient and verbal consent obtained. PROCEDURE: Bilateral Diagnostic Rigid Nasal Endoscopy Pre-procedure diagnosis: post nasal drip, nasal congestion, rule out structural lesion, Post-procedure diagnosis: same Indication: See pre-procedure diagnosis and physical exam above Complications: None apparent EBL: 0 mL Anesthesia: Lidocaine  4% and topical decongestant was topically sprayed in each nasal cavity  Description of Procedure:  Patient was identified. A rigid 30 degree endoscope was utilized to evaluate the sinonasal cavities, mucosa,  sinus ostia and turbinates and septum.  Overall, signs of mucosal inflammation are not noted.  No mucopurulence, polyps, or masses noted.   Right Middle meatus: clear Right SE Recess: clear Left MM: clear Left SE Recess: clear Photodocumentation was obtained.  CPT CODE -- 16109 - Mod 25  Procedure Note Pre-procedure diagnosis:  Dysphagia, cough Post-procedure diagnosis: Same Procedure: Transnasal Fiberoptic Laryngoscopy, CPT 31575 - Mod 25 Indication: see above Complications: None apparent EBL: 0 mL  The procedure was undertaken to further evaluate the patient's complaint above, with mirror exam inadequate for appropriate examination due to gag reflex and poor patient tolerance  Procedure:  Patient was identified as correct patient. Verbal consent was obtained. The nose was sprayed with oxymetazoline and 4% lidocaine . The The flexible laryngoscope was passed through the nose to view the nasal cavity, pharynx (oropharynx, hypopharynx) and larynx.  The larynx was examined at rest and during multiple phonatory tasks. Documentation was obtained and reviewed with patient. The scope was removed. The patient tolerated the procedure well.  Findings: The nasal cavity and nasopharynx did not reveal any masses or lesions, mucosa  appeared to be without obvious lesions. The tongue base, pharyngeal walls, piriform sinuses, vallecula, epiglottis and postcricoid region are normal in appearance EXCEPT: slight hemorrhagic area (does not look worrisome, cough related?) over right cuneiform/cricoid complex, white plaques consistent with pyriform candidiasis The visualized portion of the subglottis and proximal trachea is widely patent. The vocal folds are mobile bilaterally. There are no lesions on the free edge of the vocal folds nor elsewhere in the larynx worrisome for malignancy.        Electronically signed by: Evelina Hippo, MD 01/10/2024 4:08 PM   Impression & Plans:  [name] is a 77 y.o. male with h/o COPD:  1. Pharyngoesophageal dysphagia   2. Globus pharyngeus   3. Chronic cough   4. Candidiasis   5. Nasal congestion   6. Post-nasal drip   7. Lesion of nasal dorsum   8. Tobacco use disorder    Multiple complaints today: Dysphagia and globus -- TFL overall reassuring without large masses; will get MBS and esophagram Chronic cough: mostly in AM - do think this appears to be due to stasis due to tobacco use. Advised cessation. Could be related to PND which we'll treat but symptoms sound consistent with tobacco use and stasis overnight with coughing during AM PND: no AR sx, will start with astelin BID and saline rinses Candidiasis: could be exacerbating his globus and dysphagia sx and will Rx with fluconazole (only 1 week given interaction with warfarin potential) and nystatin BID Nasal dorsum lesion: ref to Derm  Given the patient's tobacco use, I also discussed cessation and options for cessation, including counseling. Counseled patient on the dangers of tobacco use, advised patient to stop smoking, and reviewed strategies to maximize success. Patient is not ready to quit, and declined further treatment. Total time spent with this was 5 minutes.   F/u 6 weeks with swallow tests  See below regarding exact  medications prescribed this encounter including dosages and route: Meds ordered this encounter  Medications   nystatin (MYCOSTATIN) 100000 UNIT/ML suspension    Sig: Take 5 mLs (500,000 Units total) by mouth 4 (four) times daily for 14 days.    Dispense:  400 mL    Refill:  0   fluconazole (DIFLUCAN) 150 MG tablet    Sig: Take 1 tablet (150 mg total) by mouth daily for 7 days.    Dispense:  7 tablet    Refill:  0   azelastine (ASTELIN) 0.1 % nasal spray    Sig: Place 2 sprays into both nostrils 2 (two) times daily. Use in each nostril as directed    Dispense:  30 mL    Refill:  12      Thank you for allowing me the opportunity to care for your patient. Please do not hesitate to contact me should you have any other questions.  Sincerely, Milon Aloe, MD Otolaryngologist (ENT), [facility] Phone: [phone] Fax: [phone]  01/10/2024, 4:08 PM   I have personally spent 64 minutes involved in face-to-face and non-face-to-face activities for this patient on the day of the visit.  Professional time spent excludes any procedures performed but includes the following activities, in addition to those noted in the documentation: preparing to see the patient (review of outside documentation and results), performing a medically appropriate examination, counseling (except smoking), ordering medications (astelin, nystatin, fluconazole), documenting in the electronic health record, independently interpreting results (CT).

## 2024-01-10 NOTE — Patient Instructions (Signed)
 Take fluconazole tablet once daily for 7 days Use nystatin mouthwash 4 times per day  I have ordered an imaging study for you to complete prior to your next visit. Please call [facility] Scheduling at [phone] to schedule your imaging if you have not received a call within 24 hours. If you are unable to complete your imaging study prior to your next scheduled visit please call our office to let us  know.   Use astelin nasal spray two sprays each nostril twice per day

## 2024-01-11 ENCOUNTER — Other Ambulatory Visit (HOSPITAL_COMMUNITY): Payer: Self-pay | Admitting: Otolaryngology

## 2024-01-11 DIAGNOSIS — R059 Cough, unspecified: Secondary | ICD-10-CM

## 2024-01-11 DIAGNOSIS — R1310 Dysphagia, unspecified: Secondary | ICD-10-CM

## 2024-01-14 ENCOUNTER — Other Ambulatory Visit: Payer: Self-pay | Admitting: Cardiology

## 2024-01-16 ENCOUNTER — Telehealth: Payer: Self-pay | Admitting: Cardiology

## 2024-01-16 ENCOUNTER — Other Ambulatory Visit: Payer: Self-pay | Admitting: Cardiology

## 2024-01-16 ENCOUNTER — Ambulatory Visit: Attending: Cardiology | Admitting: *Deleted

## 2024-01-16 ENCOUNTER — Other Ambulatory Visit: Payer: Self-pay

## 2024-01-16 DIAGNOSIS — Z5181 Encounter for therapeutic drug level monitoring: Secondary | ICD-10-CM

## 2024-01-16 DIAGNOSIS — I4891 Unspecified atrial fibrillation: Secondary | ICD-10-CM

## 2024-01-16 LAB — POCT INR: INR: 8 — AB (ref 2.0–3.0)

## 2024-01-16 MED ORDER — AMLODIPINE BESYLATE 5 MG PO TABS: 5.0000 mg | ORAL_TABLET | Freq: Every day | ORAL | 2 refills | Status: DC

## 2024-01-16 NOTE — Patient Instructions (Addendum)
 POC INR >8.0   Pt refused lab draw. Been taking nystatin  (MYCOSTATIN ) 100000 UNIT/ML suspension x 14 days and fluconazole  (DIFLUCAN ) 150 MG tablet daily x 7 days.  Finished diflucan  today.  Has 1 wk left of Nystatin    Hold warfarin x 4 days then resume 1 tablet daily except 1/2 tablet on Mondays and Thursdays Recheck in 1 week Pt denies any S/S of bleeding or excessive bruising.  Bleeding and fall precautions discussed with pt and he verbalized understanding.

## 2024-01-16 NOTE — Telephone Encounter (Signed)
*  STAT* If patient is at the pharmacy, call can be transferred to refill team.   1. Which medications need to be refilled? (please list name of each medication and dose if known) metoprolol  succinate (TOPROL  XL) 50 MG 24 hr tablet    2. Would you like to learn more about the convenience, safety, & potential cost savings by using the [facility]?     3. Are you open to using the [facility] (Type [facility].  ).   4. Which pharmacy/location (including street and city if local pharmacy) is medication to be sent to? [facility] [place], [place] - [address]    5. Do they need a 30 day or 90 day supply? 90 day   Pt out of medication

## 2024-01-23 ENCOUNTER — Ambulatory Visit (HOSPITAL_COMMUNITY)
Admission: RE | Admit: 2024-01-23 | Discharge: 2024-01-23 | Disposition: A | Discharge status: Home / Self Care | Source: Ambulatory Visit | Attending: Family Medicine | Admitting: Family Medicine

## 2024-01-23 ENCOUNTER — Ambulatory Visit

## 2024-01-23 ENCOUNTER — Ambulatory Visit (HOSPITAL_COMMUNITY)
Admission: RE | Admit: 2024-01-23 | Discharge: 2024-01-23 | Disposition: A | Discharge status: Home / Self Care | Source: Ambulatory Visit | Attending: Otolaryngology | Admitting: Otolaryngology

## 2024-01-23 ENCOUNTER — Other Ambulatory Visit (INDEPENDENT_AMBULATORY_CARE_PROVIDER_SITE_OTHER): Payer: Self-pay | Admitting: Otolaryngology

## 2024-01-23 DIAGNOSIS — K224 Dyskinesia of esophagus: Secondary | ICD-10-CM | POA: Diagnosis not present

## 2024-01-23 DIAGNOSIS — I4819 Other persistent atrial fibrillation: Secondary | ICD-10-CM

## 2024-01-23 DIAGNOSIS — B379 Candidiasis, unspecified: Secondary | ICD-10-CM

## 2024-01-23 DIAGNOSIS — R09A2 Foreign body sensation, throat: Secondary | ICD-10-CM | POA: Insufficient documentation

## 2024-01-23 DIAGNOSIS — I4891 Unspecified atrial fibrillation: Secondary | ICD-10-CM

## 2024-01-23 DIAGNOSIS — K222 Esophageal obstruction: Secondary | ICD-10-CM | POA: Diagnosis not present

## 2024-01-23 DIAGNOSIS — Z5181 Encounter for therapeutic drug level monitoring: Secondary | ICD-10-CM

## 2024-01-23 DIAGNOSIS — K449 Diaphragmatic hernia without obstruction or gangrene: Secondary | ICD-10-CM | POA: Insufficient documentation

## 2024-01-23 DIAGNOSIS — R053 Chronic cough: Secondary | ICD-10-CM

## 2024-01-23 DIAGNOSIS — R1310 Dysphagia, unspecified: Secondary | ICD-10-CM

## 2024-01-23 DIAGNOSIS — R059 Cough, unspecified: Secondary | ICD-10-CM | POA: Diagnosis not present

## 2024-01-23 DIAGNOSIS — R0981 Nasal congestion: Secondary | ICD-10-CM

## 2024-01-23 DIAGNOSIS — F17290 Nicotine dependence, other tobacco product, uncomplicated: Secondary | ICD-10-CM | POA: Insufficient documentation

## 2024-01-23 DIAGNOSIS — F17200 Nicotine dependence, unspecified, uncomplicated: Secondary | ICD-10-CM

## 2024-01-23 DIAGNOSIS — R0982 Postnasal drip: Secondary | ICD-10-CM

## 2024-01-23 DIAGNOSIS — L989 Disorder of the skin and subcutaneous tissue, unspecified: Secondary | ICD-10-CM

## 2024-01-23 DIAGNOSIS — R1314 Dysphagia, pharyngoesophageal phase: Secondary | ICD-10-CM

## 2024-01-23 LAB — POCT INR: INR: 2.6 (ref 2.0–3.0)

## 2024-01-23 NOTE — Patient Instructions (Signed)
 Description   Continue on same dosage of Warfarin 1 tablet daily except 1/2 tablet on Mondays and Thursdays Recheck in 3 weeks.  Call with any new medications.

## 2024-01-23 NOTE — Progress Notes (Signed)
 Modified Barium Swallow Study  Patient Details  Name: [name] MRN: [id number] Date of Birth: 09-21-1945  Today's Date: 01/23/2024  Modified Barium Swallow completed.  Full report located under Chart Review in the Imaging Section.  History of Present Illness Pt is a 32 year olf male referred by ENT for OP MBS due to complaint of chronic globus and cough. Pt also with ongoing tobacco use disorder. Laryngoscopy on 01/10/24 showed normal findings except for slight hemorrhagic area (does not look worrisome, cough related?) over right cuneiform/cricoid complex, white plaques consistent with pyriform candidiasis The visualized portion of the subglottis and proximal trachea is widely patent. Additional hx of ACDF C2-6.    Clinical Impression Pt demosntrates no oropharyngeal dysphagia, no residue, no aspiration. Pt was noted to have prominent cricopharyngeus though it did not impede bolus flow. SLP used video feedback to point out CP bar and show pt absence of residue in pharynx though he was actively reporting globus. Advised pt to continue recommendations from ENT and any prevention methods for GERD if possible. No further SLP interventions needed. Factors that may increase risk of adverse event in presence of aspiration [name] & [name] 2021):    Swallow Evaluation Recommendations Recommendations: PO diet PO Diet Recommendation: Regular;Thin liquids (Level 0) Liquid Administration via: Cup;Straw Medication Administration: Whole meds with liquid Supervision: Patient able to self-feed      [name], [name] 01/23/2024,12:46 PM

## 2024-01-28 ENCOUNTER — Other Ambulatory Visit: Payer: Self-pay | Admitting: Nurse Practitioner

## 2024-01-28 DIAGNOSIS — K219 Gastro-esophageal reflux disease without esophagitis: Secondary | ICD-10-CM

## 2024-01-28 DIAGNOSIS — E785 Hyperlipidemia, unspecified: Secondary | ICD-10-CM

## 2024-02-13 ENCOUNTER — Ambulatory Visit: Attending: Cardiology | Admitting: *Deleted

## 2024-02-13 DIAGNOSIS — Z5181 Encounter for therapeutic drug level monitoring: Secondary | ICD-10-CM | POA: Diagnosis not present

## 2024-02-13 DIAGNOSIS — I4891 Unspecified atrial fibrillation: Secondary | ICD-10-CM | POA: Diagnosis not present

## 2024-02-13 LAB — POCT INR: INR: 2.5 (ref 2.0–3.0)

## 2024-02-13 NOTE — Progress Notes (Signed)
Please see anticoagulation encounter.

## 2024-02-13 NOTE — Patient Instructions (Signed)
 Continue on same dosage of Warfarin 1 tablet daily except 1/2 tablet on Mondays and Thursdays Recheck in 4 weeks.  Call with any new medications.

## 2024-02-21 ENCOUNTER — Telehealth: Payer: Self-pay | Admitting: Cardiology

## 2024-02-21 MED ORDER — METOPROLOL SUCCINATE ER 25 MG PO TB24: 25.0000 mg | ORAL_TABLET | Freq: Every day | ORAL | 1 refills | Status: DC

## 2024-02-21 NOTE — Telephone Encounter (Signed)
 Pt's medication was sent to pt's pharmacy as requested. Confirmation received.

## 2024-02-21 NOTE — Telephone Encounter (Signed)
*  STAT* If patient is at the pharmacy, call can be transferred to refill team.   1. Which medications need to be refilled? (please list name of each medication and dose if known) metoprolol  succinate (TOPROL -XL) 25 MG 24 hr tablet    2. Would you like to learn more about the convenience, safety, & potential cost savings by using the [facility]? No   3. Are you open to using the [facility] (Type [facility]. ) No   4. Which pharmacy/location (including street and city if local pharmacy) is medication to be sent to?  [facility] [place], [place] - [address] Phone: [phone]  Fax: [phone]       5. Do they need a 30 day or 90 day supply? 90 day

## 2024-02-22 ENCOUNTER — Ambulatory Visit (INDEPENDENT_AMBULATORY_CARE_PROVIDER_SITE_OTHER): Admitting: Otolaryngology

## 2024-02-22 ENCOUNTER — Encounter (INDEPENDENT_AMBULATORY_CARE_PROVIDER_SITE_OTHER): Payer: Self-pay | Admitting: Otolaryngology

## 2024-02-22 VITALS — BP 106/66 | HR 96

## 2024-02-22 DIAGNOSIS — R0981 Nasal congestion: Secondary | ICD-10-CM | POA: Diagnosis not present

## 2024-02-22 DIAGNOSIS — B379 Candidiasis, unspecified: Secondary | ICD-10-CM

## 2024-02-22 DIAGNOSIS — R1314 Dysphagia, pharyngoesophageal phase: Secondary | ICD-10-CM

## 2024-02-22 DIAGNOSIS — K224 Dyskinesia of esophagus: Secondary | ICD-10-CM | POA: Diagnosis not present

## 2024-02-22 DIAGNOSIS — F17210 Nicotine dependence, cigarettes, uncomplicated: Secondary | ICD-10-CM | POA: Diagnosis not present

## 2024-02-22 DIAGNOSIS — R0982 Postnasal drip: Secondary | ICD-10-CM | POA: Diagnosis not present

## 2024-02-22 DIAGNOSIS — F17200 Nicotine dependence, unspecified, uncomplicated: Secondary | ICD-10-CM

## 2024-02-22 DIAGNOSIS — R09A2 Foreign body sensation, throat: Secondary | ICD-10-CM

## 2024-02-22 DIAGNOSIS — R053 Chronic cough: Secondary | ICD-10-CM

## 2024-02-22 MED ORDER — IPRATROPIUM BROMIDE 0.06 % NA SOLN: 2.0000 | Freq: Two times a day (BID) | NASAL | 12 refills | Status: DC | PRN

## 2024-02-22 NOTE — Patient Instructions (Signed)
 Aureliano Med Nasal Saline Rinse - use daily; stop 3 days before CT scan - start nasal saline rinses with NeilMed Bottle available over the counter    Nasal Saline Irrigation instructions: If you choose to make your own salt water  solution, You will need: Salt (kosher, canning, or pickling salt) Baking soda Nasal irrigation bottle (i.e. Aureliano Med Sinus Rinse) Measuring spoon ( teaspoon) Distilled / boiled water    Mix solution Mix 1 teaspoon of salt, 1/2 teaspoon of baking soda and 1 cup of water  into irrigation bottle ** May use saline packet instead of homemade recipe for this step if you prefer If medicine was prescribed to be mixed with solution, place this into bottle Examples 2 inches of 2% mupirocin  ointment Budesonide  solution Position your head: Lean over sink (about 45 degrees) Rotate head (about 45 degrees) so that one nostril is above the other Irrigate Insert tip of irrigation bottle into upper nostril so it forms a comfortable seal Irrigate while breathing through your mouth May remove the straw from the bottle in order to irrigate the entire solution (important if medicine was added) Exhale through nose when finished and blow nose as necessary  Repeat on opposite side with other 1/2 of solution (120 mL) or remake solution if all 240 mL was used on first side Wash irrigation bottle regularly, replace every 3 months

## 2024-02-22 NOTE — Progress Notes (Signed)
 Dear Dr. Zollie, Here is my assessment for our mutual patient, [name]. Thank you for allowing me the opportunity to care for your patient. Please do not hesitate to contact me should you have any other questions. Sincerely, Dr. Eldora Blanch  Otolaryngology Clinic Note Referring provider: Dr. Zollie HPI:  [name] is a 78 y.o. male kindly referred by Dr. Zollie for evaluation of post nasal drip, cough, and dysphagia.  Initial visit (01/2024): Patient reports: ongoing post nasal drip and feels like there is something hung up in his throat - things feel as if they are sticking in his throat (lump in the throat). This is constant, not just when he eats. He also generally has these symptoms worst first thing in the morning when he has to cough up mucus in the morning. During the day, he is generally not coughing and does not have significant phlegm. Drainage is mucoid, not purulent or green. Ongoing for 3-4 years. Some dysphagia with liquids more so than solids with intermittent aspiration. Does not regurgitate. Patient otherwise denies: -  odynophagia, aspiration episodes or PNA, need for Heimlich, unintentional weight loss - changes in voice, worsening shortness of breath, hemoptysis  - ear pain, neck masses No facial pressure/pain, hyposmia. Denies frequent sinus infections. No seasonal allergies. Uses flonase  intermittently, no other spray use. Mild nasal congestion b/l  --------------------------------------------------------- 02/22/2024 Returns for follow up. Continues to report dysphagia and food getting stuck mid-chest. He has not been using his sprays consistently, consistently reporting PND and some coughing in the morning. Still smoking. We discussed his swallow study results.    H&N Surgery: ACDF Personal or FHx of bleeding dz or anesthesia difficulty: no  GLP-1: no AP/AC: Warfarin  Tobacco: current use (~1 PPD - 60 pack year). Alcohol: prior 12 pack of beer/day, quit  Oct 2024  PMHx: COPD, HLD, HTN, A-fib, CABG, Prostate and bladder Ca, CHF  Independent Review of Additional Tests or Records:  Dr. Zollie (10/23/2023): noted symptoms of rhinitis; Dx: Rhinitis; Rx: ref to ENT Dr. Shelah (10/04/2023) Pulm: noted COPD; exertional SOB, morning cough and clear phlegm; Dx: Cough and COPD; Rx: continue breztri  CBC and CMP 10/23/2023: Eos 100; WBC 7.9; BUN/Cr 14/1.15 CTH 05/14/2023 independently interpreted with respect to sinuses: left septal deviation, paranasal sinuses otherwise clear MBS 01/23/2024 and Esophagram 01/23/2024 independently interpreted: no noted aspiration across consistency; CP bar but no significant trouble with passage of boluses; appears to have some distal esophageal narrowing and modest esophageal dysmotility with tertiary contractions; tablet lodged just above GE junction and then lodged again.  PMH/Meds/All/SocHx/FamHx/ROS:   Past Medical History:  Diagnosis Date   Anginal pain (HCC)    Atrial fibrillation by electrocardiogram (HCC) 11/2019   Atrial flutter by electrocardiogram [facility]) 11/2019   Bladder cancer (HCC) 01/2020   Bladder tumor    BPH (benign prostatic hyperplasia)    CAD (coronary artery disease)    a. s/p INF MI 1997;  b. s/p CABG 2001;  c. LHC 11/2009:  3v CAD, S-PDA ok with 40% mid, S-OM ok, S-Dx ok, L-LAD ok, EF 50%;  d.  Lex MV 5/14:  Inferolateral scar, EF 46%, no ischemia   COPD (chronic obstructive pulmonary disease) (HCC)    Dyspnea    ETOH abuse    GERD (gastroesophageal reflux disease)    History of gout    Hypercholesterolemia    Hypertension    Myocardial infarction [facility]) 2013   Prostate cancer (HCC)    Tobacco abuse  Past Surgical History:  Procedure Laterality Date   ANTERIOR CERVICAL DECOMPRESSION/DISCECTOMY FUSION 4 LEVELS N/A 07/21/2016   Procedure: ANTERIOR CERVICAL DECOMPRESSION/DISCECTOMY FUSION CERVICAL TWO-THREE, CERVICAL THREE-FOUR,. CERVICAL FOUR-FIVE, CERVICAL  FIVE-SIX;  Surgeon: Rockey Peru,  MD;  Location: [facility] OR;  Service: Neurosurgery;  Laterality: N/A;   BACK SURGERY     CARDIAC CATHETERIZATION  2011   CARDIOVERSION N/A 06/10/2020   Procedure: CARDIOVERSION;  Surgeon: Alvan Dorn FALCON, MD;  Location: [facility] SUITE;  Service: Endoscopy;  Laterality: N/A;   COLONOSCOPY     in remote past, Dr. Jakie. Obtaining records.    COLONOSCOPY N/A 11/21/2017   Surgeon: Shaaron Lamar HERO, MD; Three 4-6 mm polyps in the rectum and splenic flexure resected and retrieved (tubular adenomas), nonbleeding internal hemorrhoids.  Repeat in 5 years.   COLONOSCOPY WITH PROPOFOL  N/A 06/09/2021   Surgeon: Shaaron Lamar HERO, MD;  Three 3-7 mm polyps in the ascending colon resected and retrieved, mild radiation proctitis, otherwise normal exam. Pathology revealed tubular adenomas.  Recommended repeat colonoscopy in 5 years.   CORONARY ANGIOPLASTY     CORONARY ARTERY BYPASS GRAFT     CORONARY STENT INTERVENTION N/A 03/27/2022   Procedure: CORONARY STENT INTERVENTION;  Surgeon: Swaziland, Peter M, MD;  Location: [facility] INVASIVE CV LAB;  Service: Cardiovascular;  Laterality: N/A;   ESOPHAGOGASTRODUODENOSCOPY  05/27/2012   MFM:Wnmfjo esophagus, stomach and duodenum s/p dilator   POLYPECTOMY  11/21/2017   Procedure: POLYPECTOMY;  Surgeon: Shaaron Lamar HERO, MD;  Location: [facility];  Service: Endoscopy;;  colon   POLYPECTOMY  06/09/2021   Procedure: POLYPECTOMY;  Surgeon: Shaaron Lamar HERO, MD;  Location: [facility];  Service: Endoscopy;;   PROSTATE SURGERY     RIGHT/LEFT HEART CATH AND CORONARY/GRAFT ANGIOGRAPHY N/A 03/27/2022   Procedure: RIGHT/LEFT HEART CATH AND CORONARY/GRAFT ANGIOGRAPHY;  Surgeon: Swaziland, Peter M, MD;  Location: [facility] INVASIVE CV LAB;  Service: Cardiovascular;  Laterality: N/A;   TRANSURETHRAL RESECTION OF BLADDER TUMOR WITH MITOMYCIN -C Left 01/06/2020   Procedure: CYSTOSCOPY TRANSURETHRAL RESECTION OF BLADDER TUMOR,LEFT URETERSCOPY AND GEMCITABINE ;  Surgeon: Watt Rush, MD;  Location: [facility]  ORS;  Service: Urology;  Laterality: Left;    Family History  Problem Relation Age of Onset   Diabetes Mother    Hypertension Father    Heart disease Brother    Diabetes Brother    Heart disease Brother    Diabetes Brother    Multiple sclerosis Daughter    Healthy Son    Colon cancer Neg Hx    Inflammatory bowel disease Neg Hx      Social Connections: Socially Isolated (12/05/2023)   Social Connection and Isolation Panel    Frequency of Communication with Friends and Family: Once a week    Frequency of Social Gatherings with Friends and Family: Once a week    Attends Religious Services: Never    Database administrator or Organizations: No    Attends Engineer, structural: Never    Marital Status: Married      Current Outpatient Medications:    acetaminophen  (TYLENOL ) 500 MG tablet, Take 2 tablets (1,000 mg total) by mouth every 6 (six) hours as needed for moderate pain or headache., Disp: 90 tablet, Rfl: 0   albuterol  (VENTOLIN  HFA) 108 (90 Base) MCG/ACT inhaler, Inhale 2 puffs into the lungs every 6 (six) hours as needed for wheezing or shortness of breath., Disp: 3 each, Rfl: 3   allopurinol  (ZYLOPRIM ) 100 MG tablet, Take 1 tablet (100 mg total) by  mouth daily., Disp: 90 tablet, Rfl: 3   amLODipine  (NORVASC ) 5 MG tablet, Take 1 tablet (5 mg total) by mouth daily., Disp: 90 tablet, Rfl: 2   atorvastatin  (LIPITOR ) 80 MG tablet, Take 1 tablet (80 mg total) by mouth daily., Disp: 90 tablet, Rfl: 1   azelastine  (ASTELIN ) 0.1 % nasal spray, Place 2 sprays into both nostrils 2 (two) times daily. Use in each nostril as directed, Disp: 30 mL, Rfl: 12   B Complex Vitamins (VITAMIN B COMPLEX PO), Take 1 tablet by mouth daily., Disp: , Rfl:    Budeson-Glycopyrrol-Formoterol  (BREZTRI  AEROSPHERE) 160-9-4.8 MCG/ACT AERO, Inhale 2 puffs into the lungs in the morning and at bedtime., Disp: , Rfl:    Cholecalciferol (VITAMIN D3) 125 MCG (5000 UT) CAPS, Take 5,000 Units by mouth daily.,  Disp: , Rfl:    empagliflozin  (JARDIANCE ) 10 MG TABS tablet, Take 1 tablet (10 mg total) by mouth daily before breakfast., Disp: 21 tablet, Rfl: 0   fluticasone  (FLONASE ) 50 MCG/ACT nasal spray, Place 2 sprays into both nostrils daily as needed for allergies., Disp: 48 g, Rfl: 2   furosemide  (LASIX ) 20 MG tablet, Take 1 tablet (20 mg total) by mouth as needed (swelling)., Disp: 90 tablet, Rfl: 1   magnesium  oxide (MAG-OX) 400 (240 Mg) MG tablet, Take 1 tablet by mouth daily., Disp: , Rfl:    metoprolol  succinate (TOPROL  XL) 50 MG 24 hr tablet, Take 1.5 tablets (75 mg total) by mouth daily., Disp: 135 tablet, Rfl: 1   metoprolol  succinate (TOPROL -XL) 25 MG 24 hr tablet, Take 1 tablet (25 mg total) by mouth daily., Disp: 90 tablet, Rfl: 1   nitroGLYCERIN  (NITROSTAT ) 0.4 MG SL tablet, Place 1 tablet (0.4 mg total) under the tongue every 5 (five) minutes as needed for chest pain., Disp: 25 tablet, Rfl: 2   pantoprazole  (PROTONIX ) 40 MG tablet, Take 1 tablet (40 mg total) by mouth daily., Disp: 90 tablet, Rfl: 1   Potassium & Sodium Phosphates (PHOSPHORUS  SUPPLEMENT) 280-160-250 MG PACK, Take 1 packet by mouth 2 (two) times daily. In water  or juice, Disp: 180 each, Rfl: 1   spironolactone  (ALDACTONE ) 25 MG tablet, Take 0.5 tablets (12.5 mg total) by mouth 2 (two) times daily., Disp: 90 tablet, Rfl: 0   warfarin (COUMADIN ) 5 MG tablet, Take 5 mg by mouth daily. Take 1/2 - to 1 tablet daily or as directed by coumadin  clinic. Tuesday and Saturday whole tab, Disp: , Rfl:    Physical Exam:   BP 106/66 (BP Location: Left Arm, Patient Position: Sitting, Cuff Size: Normal)   Pulse 96   SpO2 93%   Salient findings:  CN II-XII intact  Bilateral EAC clear and TM intact with well pneumatized middle ear spaces Anterior rhinoscopy: Septum relatively midline; bilateral inferior turbinates without significant hypertrophy No obviously palpable neck masses/lymphadenopathy/thyromegaly No respiratory distress or  stridor White plaque over just to left midline nasal dorsum with some hyperpigmentation surrounding  Seprately Identifiable Procedures:  Prior to initiating any procedures, risks/benefits/alternatives were explained to the patient and verbal consent obtained. None today Electronically signed by: Eldora KATHEE Blanch, MD 02/22/2024 1:11 PM   Impression & Plans:  Lamonte Hartt is a 78 y.o. male with h/o COPD:  1. Pharyngoesophageal dysphagia   2. Globus pharyngeus   3. Chronic cough   4. Candidiasis   5. Nasal congestion   6. Post-nasal drip   7. Tobacco use disorder   8. Esophageal dysmotility    Multiple complaints today: Dysphagia and globus --  TFL overall reassuring without large masses; MBS shows some dysmotility and lower esophageal stenosis - will refer to GI (he has one that he reports will contact) Chronic cough and PND: mostly in AM - do think this appears to be due to stasis due to tobacco use. Advised cessation. We discussed symptomatic measures for this including rinses which he reports will try PND: no AR sx, did not improve with astelin  so can trial atrovent BID PRN especially at night - if no improvement, can discontinue Candidiasis: could be exacerbating his globus and dysphagia sx and will Rx with fluconazole  (only 1 week given interaction with warfarin potential) and nystatin  BID -- improved.  Nasal dorsum lesion: ref to Derm - upcoming appointment in July 2025  - Continued to stress smoking cessation  F/u PRN with me, certainly if sx do not improve  See below regarding exact medications prescribed this encounter including dosages and route: No orders of the defined types were placed in this encounter.     Thank you for allowing me the opportunity to care for your patient. Please do not hesitate to contact me should you have any other questions.  Sincerely, Eldora Blanch, MD Otolaryngologist (ENT), [facility] Phone: [phone] Fax:  [phone]  02/22/2024, 1:11 PM   MDM:  Level 4: 99214 Complexity/Problems addressed: mod - chronic problems, stable Data complexity: mod - independent interpretation of imaging - Morbidity: mod  - Prescription Drug prescribed or managed: y

## 2024-02-25 ENCOUNTER — Encounter: Payer: Self-pay | Admitting: Dermatology

## 2024-02-25 ENCOUNTER — Ambulatory Visit: Admitting: Dermatology

## 2024-02-25 VITALS — BP 141/65 | HR 68

## 2024-02-25 DIAGNOSIS — D492 Neoplasm of unspecified behavior of bone, soft tissue, and skin: Secondary | ICD-10-CM | POA: Diagnosis not present

## 2024-02-25 DIAGNOSIS — C434 Malignant melanoma of scalp and neck: Secondary | ICD-10-CM | POA: Diagnosis not present

## 2024-02-25 DIAGNOSIS — L720 Epidermal cyst: Secondary | ICD-10-CM | POA: Diagnosis not present

## 2024-02-25 DIAGNOSIS — L578 Other skin changes due to chronic exposure to nonionizing radiation: Secondary | ICD-10-CM

## 2024-02-25 DIAGNOSIS — I4819 Other persistent atrial fibrillation: Secondary | ICD-10-CM

## 2024-02-25 DIAGNOSIS — L570 Actinic keratosis: Secondary | ICD-10-CM

## 2024-02-25 DIAGNOSIS — W908XXA Exposure to other nonionizing radiation, initial encounter: Secondary | ICD-10-CM | POA: Diagnosis not present

## 2024-02-25 DIAGNOSIS — D485 Neoplasm of uncertain behavior of skin: Secondary | ICD-10-CM

## 2024-02-25 NOTE — Progress Notes (Signed)
 Follow-Up Visit   Subjective  [name] is a 78 y.o. male who presents for the following: spot on the nose. Lesion has been present for over 1 year. Denies pain or bleeding. Positive for itching. Accompanied by wife.   The following portions of the chart were reviewed this encounter and updated as appropriate: medications, allergies, medical history  Review of Systems:  No other skin or systemic complaints except as noted in HPI or Assessment and Plan.  Objective  Well appearing patient in no apparent distress; mood and affect are within normal limits.  A focused examination was performed of the following areas: Nose Arms scalp  Relevant exam findings are noted in the Assessment and Plan.  Right Anterior Neck 6mm brown papule  Left Forearm - Anterior, Left Malar Cheek, Left Nasal Sidewall, Right Upper Arm - Anterior Erythematous thin papules/macules with gritty scale.   Assessment & Plan   ACTINIC KERATOSIS Exam: Erythematous thin papules/macules with gritty scale  Actinic keratoses are precancerous spots that appear secondary to cumulative UV radiation exposure/sun exposure over time. They are chronic with expected duration over 1 year. A portion of actinic keratoses will progress to squamous cell carcinoma of the skin. It is not possible to reliably predict which spots will progress to skin cancer and so treatment is recommended to prevent development of skin cancer.  Recommend daily broad spectrum sunscreen SPF 30+ to sun-exposed areas, reapply every 2 hours as needed.  Recommend staying in the shade or wearing long sleeves, sun glasses (UVA+UVB protection) and wide brim hats (4-inch brim around the entire circumference of the hat). Call for new or changing lesions.  Treatment Plan:  Prior to procedure, discussed risks of blister formation, small wound, skin dyspigmentation, or rare scar following cryotherapy. Recommend Vaseline ointment to treated areas while  healing.  Destruction Procedure Note Destruction method: cryotherapy   Informed consent: discussed and consent obtained   Lesion destroyed using liquid nitrogen: Yes   Outcome: patient tolerated procedure well with no complications   Post-procedure details: wound care instructions given   Locations: left nasal sidewall and left anterior forearm, right upper arm # of Lesions Treated: 4  EPIDERMAL INCLUSION CYST Exam: Subcutaneous nodule at right clavicular area  Benign-appearing. Exam most consistent with an epidermal inclusion cyst. Discussed that a cyst is a benign growth that can grow over time and sometimes get irritated or inflamed. Recommend observation if it is not bothersome. Discussed option of surgical excision to remove it if it is growing, symptomatic, or other changes noted. Please call for new or changing lesions so they can be evaluated.   ACTINIC DAMAGE - chronic, secondary to cumulative UV radiation exposure/sun exposure over time - diffuse scaly erythematous macules with underlying dyspigmentation - Recommend daily broad spectrum sunscreen SPF 30+ to sun-exposed areas, reapply every 2 hours as needed.  - Recommend staying in the shade or wearing long sleeves, sun glasses (UVA+UVB protection) and wide brim hats (4-inch brim around the entire circumference of the hat). - Call for new or changing lesions.  NEOPLASM OF UNCERTAIN BEHAVIOR OF SKIN Right Anterior Neck Skin / nail biopsy Type of biopsy: tangential   Informed consent: discussed and consent obtained   Timeout: patient name, date of birth, surgical site, and procedure verified   Procedure prep:  Patient was prepped and draped in usual sterile fashion Prep type:  Isopropyl alcohol Anesthesia: the lesion was anesthetized in a standard fashion   Anesthetic:  1% lidocaine  w/ epinephrine  1-100,000 buffered  w/ 8.4% NaHCO3 Instrument used: DermaBlade   Outcome: patient tolerated procedure well   Post-procedure  details: sterile dressing applied and wound care instructions given   Dressing type: bandage and petrolatum    Specimen 1 - Surgical pathology Differential Diagnosis: r/o MM vs DN  Check Margins: No AK (ACTINIC KERATOSIS) (4) Left Forearm - Anterior, Left Malar Cheek, Left Nasal Sidewall, Right Upper Arm - Anterior Destruction of lesion - Left Forearm - Anterior, Left Malar Cheek, Left Nasal Sidewall, Right Upper Arm - Anterior Complexity: simple   Destruction method: cryotherapy   Informed consent: discussed and consent obtained   Lesion destroyed using liquid nitrogen: Yes   Region frozen until ice ball extended beyond lesion: Yes   Cryotherapy cycles:  2 Outcome: patient tolerated procedure well with no complications   Post-procedure details: wound care instructions given     Return in about 2 months (around 04/27/2024) for ak follow up.  LILLETTE Rollene Gobble, RN, am acting as scribe for RUFUS CHRISTELLA HOLY, MD .   Documentation: I have reviewed the above documentation for accuracy and completeness, and I agree with the above.  RUFUS CHRISTELLA HOLY, MD

## 2024-02-25 NOTE — Patient Instructions (Addendum)
 For areas treated with Liquid Nitrogen:  Keep clean with soap and water.  Apply Vaseline or Aquaphor twice daily.    Patient Handout: Wound Care for Skin Biopsy Site  Taking Care of Your Skin Biopsy Site  Proper care of the biopsy site is essential for promoting healing and minimizing scarring. This handout provides instructions on how to care for your biopsy site to ensure optimal recovery.  1. Cleaning the Wound:  Clean the biopsy site daily with gentle soap and water. Gently pat the area dry with a clean, soft towel. Avoid harsh scrubbing or rubbing the area, as this can irritate the skin and delay healing.  2. Applying Aquaphor and Bandage:  After cleaning the wound, apply a thin layer of Aquaphor ointment to the biopsy site. Cover the area with a sterile bandage to protect it from dirt, bacteria, and friction. Change the bandage daily or as needed if it becomes soiled or wet.  3. Continued Care for One Week:  Repeat the cleaning, Aquaphor application, and bandaging process daily for one week following the biopsy procedure. Keeping the wound clean and moist during this initial healing period will help prevent infection and promote optimal healing.  4. Massaging Aquaphor into the Area:  ---After one week, discontinue the use of bandages but continue to apply Aquaphor to the biopsy site. ----Gently massage the Aquaphor into the area using circular motions. ---Massaging the skin helps to promote circulation and prevent the formation of scar tissue.   Additional Tips:  Avoid exposing the biopsy site to direct sunlight during the healing process, as this can cause hyperpigmentation or worsen scarring. If you experience any signs of infection, such as increased redness, swelling, warmth, or drainage from the wound, contact your healthcare provider immediately. Follow any additional instructions provided by your healthcare provider for caring for the biopsy site and managing any  discomfort. Conclusion:  Taking proper care of your skin biopsy site is crucial for ensuring optimal healing and minimizing scarring. By following these instructions for cleaning, applying Aquaphor, and massaging the area, you can promote a smooth and successful recovery. If you have any questions or concerns about caring for your biopsy site, don't hesitate to contact your healthcare provider for guidance.   Important Information  Due to recent changes in healthcare laws, you may see results of your pathology and/or laboratory studies on MyChart before the doctors have had a chance to review them. We understand that in some cases there may be results that are confusing or concerning to you. Please understand that not all results are received at the same time and often the doctors may need to interpret multiple results in order to provide you with the best plan of care or course of treatment. Therefore, we ask that you please give us  2 business days to thoroughly review all your results before contacting the office for clarification. Should we see a critical lab result, you will be contacted sooner.   If You Need Anything After Your Visit  If you have any questions or concerns for your doctor, please call our main line at [phone] If no one answers, please leave a voicemail as directed and we will return your call as soon as possible. Messages left after 4 pm will be answered the following business day.   You may also send us  a message via MyChart. We typically respond to MyChart messages within 1-2 business days.  For prescription refills, please ask your pharmacy to contact our office. Our fax  number is [phone].  If you have an urgent issue when the clinic is closed that cannot wait until the next business day, you can page your doctor at the number below.    Please note that while we do our best to be available for urgent issues outside of office hours, we are not available 24/7.    If you have an urgent issue and are unable to reach us , you may choose to seek medical care at your doctor's office, retail clinic, urgent care center, or emergency room.  If you have a medical emergency, please immediately call 911 or go to the emergency department. In the event of inclement weather, please call our main line at [phone] for an update on the status of any delays or closures.  Dermatology Medication Tips: Please keep the boxes that topical medications come in in order to help keep track of the instructions about where and how to use these. Pharmacies typically print the medication instructions only on the boxes and not directly on the medication tubes.   If your medication is too expensive, please contact our office at [phone] or send us  a message through MyChart.   We are unable to tell what your co-pay for medications will be in advance as this is different depending on your insurance coverage. However, we may be able to find a substitute medication at lower cost or fill out paperwork to get insurance to cover a needed medication.   If a prior authorization is required to get your medication covered by your insurance company, please allow us  1-2 business days to complete this process.  Drug prices often vary depending on where the prescription is filled and some pharmacies may offer cheaper prices.  The website www.goodrx.com contains coupons for medications through different pharmacies. The prices here do not account for what the cost may be with help from insurance (it may be cheaper with your insurance), but the website can give you the price if you did not use any insurance.  - You can print the associated coupon and take it with your prescription to the pharmacy.  - You may also stop by our office during regular business hours and pick up a GoodRx coupon card.  - If you need your prescription sent electronically to a different pharmacy, notify our office  through [facility] or by phone at [phone]

## 2024-02-27 LAB — SURGICAL PATHOLOGY

## 2024-02-28 ENCOUNTER — Ambulatory Visit: Payer: Self-pay | Admitting: Dermatology

## 2024-03-01 ENCOUNTER — Other Ambulatory Visit: Payer: Self-pay | Admitting: Nurse Practitioner

## 2024-03-03 ENCOUNTER — Encounter: Payer: Self-pay | Admitting: *Deleted

## 2024-03-03 ENCOUNTER — Encounter: Payer: Self-pay | Admitting: Cardiology

## 2024-03-03 ENCOUNTER — Ambulatory Visit: Attending: Cardiology | Admitting: Cardiology

## 2024-03-03 VITALS — BP 105/55 | HR 76 | Ht 66.0 in | Wt 145.2 lb

## 2024-03-03 DIAGNOSIS — I2510 Atherosclerotic heart disease of native coronary artery without angina pectoris: Secondary | ICD-10-CM | POA: Diagnosis not present

## 2024-03-03 DIAGNOSIS — I4891 Unspecified atrial fibrillation: Secondary | ICD-10-CM

## 2024-03-03 DIAGNOSIS — I5032 Chronic diastolic (congestive) heart failure: Secondary | ICD-10-CM

## 2024-03-03 NOTE — Progress Notes (Signed)
 Clinical Summary [name] is a 78 y.o.male seen today for follow up of the following medical problems.    1. HFimpEF - started about 2 months ago, which is new for him - 12/2019 echo LVEF 55%, indet diastolic fxn.  - 11/2021 BNP 146  02/2022 echo LVEF 40-45%, mild RV dysfunction, mild to mod MR - 06/2022 echo: LVEF 45-50% - 05/2023 echo: LVEF 50%, no WMAs, indet diastolic. Normal RV, mild MR   - prior high K on ARB, have tried low dose.      - some swelling left leg. Some recent SOB/DOE that is progressing, started about 3-4 months ago. Increased wheeze, cough over that period.  - compliant with meds - no recent chest pains. Some wheezing, coughing.  - has lasix  to take prn, has not needed.   - chronic SOb/DOE. Increased, cough wheeze - no recent edema - compliant with meds     2. CAD   - prior MI in 1997, prior CABG  - cath 2011 showed patent grafts. 04/2013 echo shows LVEF 60%   - myoview 12/2012 shows old inferolateral scar, no ischemia    -admit 03/2022 with chest pain. Atypical but given recent drop in LVEF pursued cath - 03/27/22 cath as reported below, PCI to SVG-OM1.     - no recent chest pains - compliant with meds   3. HTN   - he is compliant with meds     4. HLD   05/2021 TC 121 TG 120 HDL 58 LDL 42 11/2021 TC 120 TG 117 HDL 49 LDL 50 - 05/2022 TC 101 TG 79 HDL 42 LDL 43 - 10/2023 TC 852 TG 861 HDL 42 LDL 81     4. COPD - followed by pulmonary Dr Shelah - significant cough starting Friday - +cough, some SOB brownish. Some wheezing.    5. AAA screen - negative US  06/2016   6. Afib - new diagnosis during 11/2019 preop evaluation  08/11/19 DCCV successful - recurrent afib, has been just rate controlled.    - no recent palpitations - no bleeding on coumadin .  - eliquis  is too expensive.   7. Syncope - admit 05/2023, hypotension which responded to IVFs - secondary to dehydration Past Medical History:  Diagnosis Date   Anginal pain (HCC)     Atrial fibrillation by electrocardiogram (HCC) 11/2019   Atrial flutter by electrocardiogram (HCC) 11/2019   Bladder cancer (HCC) 01/2020   Bladder tumor    BPH (benign prostatic hyperplasia)    CAD (coronary artery disease)    a. s/p INF MI 1997;  b. s/p CABG 2001;  c. LHC 11/2009:  3v CAD, S-PDA ok with 40% mid, S-OM ok, S-Dx ok, L-LAD ok, EF 50%;  d.  Lex MV 5/14:  Inferolateral scar, EF 46%, no ischemia   COPD (chronic obstructive pulmonary disease) (HCC)    Dyspnea    ETOH abuse    GERD (gastroesophageal reflux disease)    History of gout    Hypercholesterolemia    Hypertension    Myocardial infarction [facility]) 2013   Prostate cancer (HCC)    Tobacco abuse      No Known Allergies   Current Outpatient Medications  Medication Sig Dispense Refill   acetaminophen  (TYLENOL ) 500 MG tablet Take 2 tablets (1,000 mg total) by mouth every 6 (six) hours as needed for moderate pain or headache. 90 tablet 0   albuterol  (VENTOLIN  HFA) 108 (90 Base) MCG/ACT inhaler Inhale 2 puffs into  the lungs every 6 (six) hours as needed for wheezing or shortness of breath. 3 each 3   allopurinol  (ZYLOPRIM ) 100 MG tablet Take 1 tablet (100 mg total) by mouth daily. 90 tablet 3   amLODipine  (NORVASC ) 5 MG tablet Take 1 tablet (5 mg total) by mouth daily. 90 tablet 2   atorvastatin  (LIPITOR ) 80 MG tablet Take 1 tablet (80 mg total) by mouth daily. 90 tablet 1   azelastine  (ASTELIN ) 0.1 % nasal spray Place 2 sprays into both nostrils 2 (two) times daily. Use in each nostril as directed 30 mL 12   B Complex Vitamins (VITAMIN B COMPLEX PO) Take 1 tablet by mouth daily.     Budeson-Glycopyrrol-Formoterol  (BREZTRI  AEROSPHERE) 160-9-4.8 MCG/ACT AERO Inhale 2 puffs into the lungs in the morning and at bedtime.     Cholecalciferol (VITAMIN D3) 125 MCG (5000 UT) CAPS Take 5,000 Units by mouth daily.     empagliflozin  (JARDIANCE ) 10 MG TABS tablet Take 1 tablet (10 mg total) by mouth daily before breakfast. 21 tablet 0    fluticasone  (FLONASE ) 50 MCG/ACT nasal spray Place 2 sprays into both nostrils daily as needed for allergies. 48 g 2   furosemide  (LASIX ) 20 MG tablet Take 1 tablet (20 mg total) by mouth as needed (swelling). 90 tablet 1   ipratropium (ATROVENT ) 0.06 % nasal spray Place 2 sprays into both nostrils 2 (two) times daily as needed. 15 mL 12   magnesium  oxide (MAG-OX) 400 (240 Mg) MG tablet Take 1 tablet by mouth daily.     metoprolol  succinate (TOPROL  XL) 50 MG 24 hr tablet Take 1.5 tablets (75 mg total) by mouth daily. 135 tablet 1   metoprolol  succinate (TOPROL -XL) 25 MG 24 hr tablet Take 1 tablet (25 mg total) by mouth daily. 90 tablet 1   nitroGLYCERIN  (NITROSTAT ) 0.4 MG SL tablet Place 1 tablet (0.4 mg total) under the tongue every 5 (five) minutes as needed for chest pain. 25 tablet 2   pantoprazole  (PROTONIX ) 40 MG tablet Take 1 tablet (40 mg total) by mouth daily. 90 tablet 1   Potassium & Sodium Phosphates (PHOSPHORUS  SUPPLEMENT) 280-160-250 MG PACK Take 1 packet by mouth 2 (two) times daily. In water  or juice 180 each 1   spironolactone  (ALDACTONE ) 25 MG tablet Take 0.5 tablets (12.5 mg total) by mouth 2 (two) times daily. 90 tablet 0   warfarin (COUMADIN ) 5 MG tablet Take 5 mg by mouth daily. Take 1/2 - to 1 tablet daily or as directed by coumadin  clinic. Tuesday and Saturday whole tab     No current facility-administered medications for this visit.     Past Surgical History:  Procedure Laterality Date   ANTERIOR CERVICAL DECOMPRESSION/DISCECTOMY FUSION 4 LEVELS N/A 07/21/2016   Procedure: ANTERIOR CERVICAL DECOMPRESSION/DISCECTOMY FUSION CERVICAL TWO-THREE, CERVICAL THREE-FOUR,. CERVICAL FOUR-FIVE, CERVICAL  FIVE-SIX;  Surgeon: Rockey Peru, MD;  Location: [facility] OR;  Service: Neurosurgery;  Laterality: N/A;   BACK SURGERY     CARDIAC CATHETERIZATION  2011   CARDIOVERSION N/A 06/10/2020   Procedure: CARDIOVERSION;  Surgeon: Alvan Dorn FALCON, MD;  Location: [facility] SUITE;  Service:  Endoscopy;  Laterality: N/A;   COLONOSCOPY     in remote past, Dr. Jakie. Obtaining records.    COLONOSCOPY N/A 11/21/2017   Surgeon: Shaaron Lamar HERO, MD; Three 4-6 mm polyps in the rectum and splenic flexure resected and retrieved (tubular adenomas), nonbleeding internal hemorrhoids.  Repeat in 5 years.   COLONOSCOPY WITH PROPOFOL  N/A 06/09/2021   Surgeon:  Shaaron Lamar HERO, MD;  Three 3-7 mm polyps in the ascending colon resected and retrieved, mild radiation proctitis, otherwise normal exam. Pathology revealed tubular adenomas.  Recommended repeat colonoscopy in 5 years.   CORONARY ANGIOPLASTY     CORONARY ARTERY BYPASS GRAFT     CORONARY STENT INTERVENTION N/A 03/27/2022   Procedure: CORONARY STENT INTERVENTION;  Surgeon: Swaziland, Peter M, MD;  Location: [facility] INVASIVE CV LAB;  Service: Cardiovascular;  Laterality: N/A;   ESOPHAGOGASTRODUODENOSCOPY  05/27/2012   MFM:Wnmfjo esophagus, stomach and duodenum s/p dilator   POLYPECTOMY  11/21/2017   Procedure: POLYPECTOMY;  Surgeon: Shaaron Lamar HERO, MD;  Location: [facility];  Service: Endoscopy;;  colon   POLYPECTOMY  06/09/2021   Procedure: POLYPECTOMY;  Surgeon: Shaaron Lamar HERO, MD;  Location: [facility];  Service: Endoscopy;;   PROSTATE SURGERY     RIGHT/LEFT HEART CATH AND CORONARY/GRAFT ANGIOGRAPHY N/A 03/27/2022   Procedure: RIGHT/LEFT HEART CATH AND CORONARY/GRAFT ANGIOGRAPHY;  Surgeon: Swaziland, Peter M, MD;  Location: [facility] INVASIVE CV LAB;  Service: Cardiovascular;  Laterality: N/A;   TRANSURETHRAL RESECTION OF BLADDER TUMOR WITH MITOMYCIN -C Left 01/06/2020   Procedure: CYSTOSCOPY TRANSURETHRAL RESECTION OF BLADDER TUMOR,LEFT URETERSCOPY AND GEMCITABINE ;  Surgeon: Watt Rush, MD;  Location: [facility] ORS;  Service: Urology;  Laterality: Left;     No Known Allergies    Family History  Problem Relation Age of Onset   Diabetes Mother    Hypertension Father    Heart disease Brother    Diabetes Brother    Heart disease Brother     Diabetes Brother    Skin cancer Brother    Multiple sclerosis Daughter    Healthy Son    Colon cancer Neg Hx    Inflammatory bowel disease Neg Hx      Social History [name] reports that he has been smoking cigarettes and e-cigarettes. He started smoking about 54 years ago. He has a 59 pack-year smoking history. He has never been exposed to tobacco smoke. He quit smokeless tobacco use about 11 years ago.  His smokeless tobacco use included snuff. [name] reports current alcohol use of about 2.0 - 4.0 standard drinks of alcohol per week.     Physical Examination Today's Vitals   03/03/24 1353 03/03/24 1419  BP: (!) 140/78 (!) 105/55  Pulse: 76   SpO2: 98%   Weight: 145 lb 3.2 oz (65.9 kg)   Height: 5' 6 (1.676 m)    Body mass index is 23.44 kg/m.  Gen: resting comfortably, no acute distress HEENT: no scleral icterus, pupils equal round and reactive, no palptable cervical adenopathy,  CV: irreg, no mrg, no jvd Resp: Clear to auscultation bilaterally GI: abdomen is soft, non-tender, non-distended, normal bowel sounds, no hepatosplenomegaly MSK: extremities are warm, no edema.  Skin: warm, no rash Neuro:  no focal deficits Psych: appropriate affect   Diagnostic Studies  11/25/2009:   Cardiac Cath Findings: Left ventricular angiogram was performed in the RAO projection and   showed normal left ventricular systolic function with ejection   fraction estimated at 50%. Mild mitral regurgitation was noted.   10.Aortic root angiogram was performed and did not show enlargement of   the aortic root.   IMPRESSION:   1. Severe triple-vessel coronary artery disease.   2. Patent bypass grafts 4/4.   3. Low normal left ventricular systolic function.     12/2012 Myoview Inferolateral scar, no active ischemia, LVEF 46%.     05/01/13 Echo: LVEF 55-60%, grade II  diastolic dysfunction, multiple WMAs, mild MR,     06/2016 AAA US  No aneurysm     02/2022 echo IMPRESSIONS      1. Left ventricular ejection fraction, by estimation, is 40 to 45%. The  left ventricle has mildly decreased function. The left ventricle  demonstrates regional wall motion abnormalities (see scoring  diagram/findings for description). There is mild  asymmetric left ventricular hypertrophy of the basal segment. Left  ventricular diastolic parameters are indeterminate. The average left  ventricular global longitudinal strain is -10.0 %. The global longitudinal  strain is abnormal.   2. Right ventricular systolic function is mildly reduced. The right  ventricular size is normal. There is normal pulmonary artery systolic  pressure. The estimated right ventricular systolic pressure is 22.9 mmHg.   3. Left atrial size was mildly dilated.   4. The mitral valve is grossly normal. Mild to moderate mitral valve  regurgitation.   5. Tricuspid valve regurgitation is mild to moderate.   6. The aortic valve is tricuspid. Aortic valve regurgitation is not  visualized.   7. The inferior vena cava is normal in size with greater than 50%  respiratory variability, suggesting right atrial pressure of 3 mmHg.        03/2022 RHC/LHC  Ost LAD to Prox LAD lesion is 100% stenosed.   Mid LAD lesion is 100% stenosed.   Prox Cx lesion is 100% stenosed.   Prox RCA to Mid RCA lesion is 80% stenosed.   Mid RCA lesion is 100% stenosed.   Origin to Prox Graft lesion is 80% stenosed.   Origin to Prox Graft lesion is 100% stenosed.   A drug-eluting stent was successfully placed using a SYNERGY XD 4.0X12.   Post intervention, there is a 0% residual stenosis.   LIMA graft was visualized by angiography and is normal in caliber.   SVG graft was visualized by angiography and is normal in caliber.   SVG graft was visualized by angiography and is normal in caliber.   SVG graft was visualized by angiography.   The graft exhibits no disease.   The graft exhibits no disease.   LV end diastolic pressure is mildly  elevated.   Hemodynamic findings consistent with mild pulmonary hypertension.   3 vessel occlusive CAD Patent LIMA to the LAD Patent SVG to the first diagonal Patent SVG to OM1 with 80% focal stenosis in the proximal SVG Occluded SVG to RCA. The RCA fills by left to right collaterals.  Mildly elevated LV filling pressures.  Mild pulmonary HTN.  Normal cardiac output 4.51 L/min with index 2.53.  Successful PCI of the SVG to OM1 with DES   Plan: DAPT with ASA for one month and Plavix  for 12 months. Can resume oral anticoagulation in am if no bleeding noted.      06/2022 echo   1. Left ventricular ejection fraction, by estimation, is 45 to 50%. The  left ventricle has mildly decreased function. The left ventricle  demonstrates regional wall motion abnormalities (see scoring  diagram/findings for description). There is mild  asymmetric left ventricular hypertrophy of the basal segment. Left  ventricular diastolic parameters are indeterminate.   2. Right ventricular systolic function is normal. The right ventricular  size is normal. There is normal pulmonary artery systolic pressure. The  estimated right ventricular systolic pressure is 24.3 mmHg.   3. Left atrial size was mild to moderately dilated.   4. The mitral valve is grossly normal. Mild to moderate mitral valve  regurgitation.  5. The aortic valve is tricuspid. Aortic valve regurgitation is not  visualized.   6. The inferior vena cava is normal in size with greater than 50%  respiratory variability, suggesting right atrial pressure of 3 mmHg.   Comparison(s): Prior images reviewed side by side. LVEF improved somewhat  at 45-50% with wall motion abnormalities consistent with ischemic  cardiomyopathy.    Assessment and Plan  1.HFimpEF - prior high K on higher ARB dosing, has tolerated low dose losartan . For this reason did not progress to entresto.  - no symptoms, continue current meds   2. CAD -no symptoms, continue  current meds   3. AFib - denies any symptoms - continue current meds. Eliquis  was too expensive, has remained on coumadin .        Dorn PHEBE Ross, M.D.

## 2024-03-03 NOTE — Patient Instructions (Addendum)
 Medication Instructions:  Continue all current medications.   Labwork: none  Testing/Procedures: none  Follow-Up: 6 months   Any Other Special Instructions Will Be Listed Below (If Applicable).   If you need a refill on your cardiac medications before your next appointment, please call your pharmacy.

## 2024-03-12 ENCOUNTER — Ambulatory Visit: Attending: Cardiology | Admitting: *Deleted

## 2024-03-12 DIAGNOSIS — Z5181 Encounter for therapeutic drug level monitoring: Secondary | ICD-10-CM

## 2024-03-12 DIAGNOSIS — I4891 Unspecified atrial fibrillation: Secondary | ICD-10-CM

## 2024-03-12 LAB — POCT INR: INR: 2.7 (ref 2.0–3.0)

## 2024-03-12 NOTE — Progress Notes (Signed)
 INR 2.7  Please see anticoagulation encounter

## 2024-03-12 NOTE — Patient Instructions (Signed)
 Continue on same dosage of Warfarin 1 tablet daily except 1/2 tablet on Mondays and Thursdays Recheck in 6 weeks.  Call with any new medications.

## 2024-03-13 NOTE — Progress Notes (Unsigned)
 Referring Provider: Zollie Lowers, MD Primary Care Physician:  Zollie Lowers, MD Primary GI Physician: Dr. Shaaron  No chief complaint on file.   HPI:   [name] is a 78 y.o. male with with history significant for atrial fibrillation/atrial flutter s/p cardioversion 06/2020 on Eliquis , COPD, CAD, MI s/p CABG on Plavix , HTN, GERD, bladder cancer, and prostate cancer , GERD, chronic intermittent toilet tissue hematochezia, radiation proctitis, adenomatous colon polyps, presenting today further evaluation of dysphagia.    Last EGD 05/27/2012: Normal exam s/p empiric esophageal dilation.  Barium pill esophagram 01/23/2024 with smooth stricture of the distal esophagus just above the GE junction about 1 cm in length.  13 mm barium tablet became lodged in this vicinity, but eventually passed.  No irregularity to specifically indicate a malignant stricture, but endoscopy is likely warranted for further characterization and potential treatment.  Nonspecific esophageal dysmotility disorder.  Small type I hiatal hernia.  Modified barium swallow 01/23/2024: Normal exam aside from prominent cricopharyngeus though it did not impede bolus flow.   Today:       Last Colonoscopy 06/09/2021:  Three 3-7 mm polyps in the ascending colon resected and retrieved, mild radiation proctitis, otherwise normal exam.  Notably, patient reported bleeding had resolved.  Pathology revealed tubular adenomas.  Recommended repeat colonoscopy in 5 years.   Past Medical History:  Diagnosis Date   Anginal pain (HCC)    Atrial fibrillation by electrocardiogram (HCC) 11/2019   Atrial flutter by electrocardiogram [facility]) 11/2019   Bladder cancer (HCC) 01/2020   Bladder tumor    BPH (benign prostatic hyperplasia)    CAD (coronary artery disease)    a. s/p INF MI 1997;  b. s/p CABG 2001;  c. LHC 11/2009:  3v CAD, S-PDA ok with 40% mid, S-OM ok, S-Dx ok, L-LAD ok, EF 50%;  d.  Lex MV 5/14:  Inferolateral scar, EF 46%, no  ischemia   COPD (chronic obstructive pulmonary disease) (HCC)    Dyspnea    ETOH abuse    GERD (gastroesophageal reflux disease)    History of gout    Hypercholesterolemia    Hypertension    Myocardial infarction [facility]) 2013   Prostate cancer (HCC)    Tobacco abuse     Past Surgical History:  Procedure Laterality Date   ANTERIOR CERVICAL DECOMPRESSION/DISCECTOMY FUSION 4 LEVELS N/A 07/21/2016   Procedure: ANTERIOR CERVICAL DECOMPRESSION/DISCECTOMY FUSION CERVICAL TWO-THREE, CERVICAL THREE-FOUR,. CERVICAL FOUR-FIVE, CERVICAL  FIVE-SIX;  Surgeon: Rockey Peru, MD;  Location: [facility] OR;  Service: Neurosurgery;  Laterality: N/A;   BACK SURGERY     CARDIAC CATHETERIZATION  2011   CARDIOVERSION N/A 06/10/2020   Procedure: CARDIOVERSION;  Surgeon: Alvan Dorn FALCON, MD;  Location: [facility] SUITE;  Service: Endoscopy;  Laterality: N/A;   COLONOSCOPY     in remote past, Dr. Jakie. Obtaining records.    COLONOSCOPY N/A 11/21/2017   Surgeon: Shaaron Lamar HERO, MD; Three 4-6 mm polyps in the rectum and splenic flexure resected and retrieved (tubular adenomas), nonbleeding internal hemorrhoids.  Repeat in 5 years.   COLONOSCOPY WITH PROPOFOL  N/A 06/09/2021   Surgeon: Shaaron Lamar HERO, MD;  Three 3-7 mm polyps in the ascending colon resected and retrieved, mild radiation proctitis, otherwise normal exam. Pathology revealed tubular adenomas.  Recommended repeat colonoscopy in 5 years.   CORONARY ANGIOPLASTY     CORONARY ARTERY BYPASS GRAFT     CORONARY STENT INTERVENTION N/A 03/27/2022   Procedure: CORONARY STENT INTERVENTION;  Surgeon: Swaziland, Peter M, MD;  Location: [facility] INVASIVE CV LAB;  Service: Cardiovascular;  Laterality: N/A;   ESOPHAGOGASTRODUODENOSCOPY  05/27/2012   MFM:Wnmfjo esophagus, stomach and duodenum s/p dilator   POLYPECTOMY  11/21/2017   Procedure: POLYPECTOMY;  Surgeon: Shaaron Lamar HERO, MD;  Location: [facility];  Service: Endoscopy;;  colon   POLYPECTOMY  06/09/2021   Procedure:  POLYPECTOMY;  Surgeon: Shaaron Lamar HERO, MD;  Location: [facility];  Service: Endoscopy;;   PROSTATE SURGERY     RIGHT/LEFT HEART CATH AND CORONARY/GRAFT ANGIOGRAPHY N/A 03/27/2022   Procedure: RIGHT/LEFT HEART CATH AND CORONARY/GRAFT ANGIOGRAPHY;  Surgeon: Swaziland, Peter M, MD;  Location: [facility] INVASIVE CV LAB;  Service: Cardiovascular;  Laterality: N/A;   TRANSURETHRAL RESECTION OF BLADDER TUMOR WITH MITOMYCIN -C Left 01/06/2020   Procedure: CYSTOSCOPY TRANSURETHRAL RESECTION OF BLADDER TUMOR,LEFT URETERSCOPY AND GEMCITABINE ;  Surgeon: Watt Rush, MD;  Location: [facility] ORS;  Service: Urology;  Laterality: Left;    Current Outpatient Medications  Medication Sig Dispense Refill   acetaminophen  (TYLENOL ) 500 MG tablet Take 2 tablets (1,000 mg total) by mouth every 6 (six) hours as needed for moderate pain or headache. 90 tablet 0   albuterol  (VENTOLIN  HFA) 108 (90 Base) MCG/ACT inhaler Inhale 2 puffs into the lungs every 6 (six) hours as needed for wheezing or shortness of breath. 3 each 3   allopurinol  (ZYLOPRIM ) 100 MG tablet Take 1 tablet (100 mg total) by mouth daily. 90 tablet 3   amLODipine  (NORVASC ) 5 MG tablet Take 1 tablet (5 mg total) by mouth daily. 90 tablet 2   atorvastatin  (LIPITOR ) 80 MG tablet Take 1 tablet (80 mg total) by mouth daily. 90 tablet 1   azelastine  (ASTELIN ) 0.1 % nasal spray Place 2 sprays into both nostrils 2 (two) times daily. Use in each nostril as directed 30 mL 12   B Complex Vitamins (VITAMIN B COMPLEX PO) Take 1 tablet by mouth daily.     Budeson-Glycopyrrol-Formoterol  (BREZTRI  AEROSPHERE) 160-9-4.8 MCG/ACT AERO Inhale 2 puffs into the lungs in the morning and at bedtime.     Cholecalciferol (VITAMIN D3) 125 MCG (5000 UT) CAPS Take 5,000 Units by mouth daily.     empagliflozin  (JARDIANCE ) 10 MG TABS tablet Take 1 tablet (10 mg total) by mouth daily before breakfast. 21 tablet 0   fluticasone  (FLONASE ) 50 MCG/ACT nasal spray Place 2 sprays into both nostrils daily as  needed for allergies. 48 g 2   furosemide  (LASIX ) 20 MG tablet Take 1 tablet (20 mg total) by mouth as needed (swelling). 90 tablet 1   ipratropium (ATROVENT ) 0.06 % nasal spray Place 2 sprays into both nostrils 2 (two) times daily as needed. 15 mL 12   magnesium  oxide (MAG-OX) 400 (240 Mg) MG tablet Take 1 tablet by mouth daily.     metoprolol  succinate (TOPROL  XL) 50 MG 24 hr tablet Take 1.5 tablets (75 mg total) by mouth daily. 135 tablet 1   metoprolol  succinate (TOPROL -XL) 25 MG 24 hr tablet Take 1 tablet (25 mg total) by mouth daily. 90 tablet 1   nitroGLYCERIN  (NITROSTAT ) 0.4 MG SL tablet Place 1 tablet (0.4 mg total) under the tongue every 5 (five) minutes as needed for chest pain. 25 tablet 2   pantoprazole  (PROTONIX ) 40 MG tablet Take 1 tablet (40 mg total) by mouth daily. 90 tablet 1   Potassium & Sodium Phosphates (PHOSPHORUS  SUPPLEMENT) 280-160-250 MG PACK Take 1 packet by mouth 2 (two) times daily. In water  or juice 180 each 1   spironolactone  (ALDACTONE ) 25 MG  tablet Take 0.5 tablets (12.5 mg total) by mouth 2 (two) times daily. 90 tablet 0   warfarin (COUMADIN ) 5 MG tablet Take 5 mg by mouth daily. Take 1/2 - to 1 tablet daily or as directed by coumadin  clinic. Tuesday and Saturday whole tab     No current facility-administered medications for this visit.    Allergies as of 03/14/2024   (No Known Allergies)    Family History  Problem Relation Age of Onset   Diabetes Mother    Hypertension Father    Heart disease Brother    Diabetes Brother    Heart disease Brother    Diabetes Brother    Skin cancer Brother    Multiple sclerosis Daughter    Healthy Son    Colon cancer Neg Hx    Inflammatory bowel disease Neg Hx     Social History   Socioeconomic History   Marital status: Married    Spouse name: Not on file   Number of children: 2   Years of education: Not on file   Highest education level: Not on file  Occupational History    Comment: retired  Tobacco Use    Smoking status: Every Day    Current packs/day: 1.00    Average packs/day: 1 pack/day for 59.0 years (59.0 ttl pk-yrs)    Types: Cigarettes, E-cigarettes    Start date: 03/08/1970    Passive exposure: Never   Smokeless tobacco: Former    Types: Snuff    Quit date: 03/08/2012   Tobacco comments:    10 - 15 cigarettes smoked daily [facility] 01/19/23  Vaping Use   Vaping status: Former  Substance and Sexual Activity   Alcohol use: Yes    Alcohol/week: 2.0 - 4.0 standard drinks of alcohol    Types: 2 - 4 Cans of beer per week    Comment: 2-4 beers per day   Drug use: No   Sexual activity: Yes    Partners: Female  Other Topics Concern   Not on file  Social History Narrative   His brother lives with him and his wife. Daughter lives in [place], son lives nearby.   Social Drivers of Corporate investment banker Strain: Low Risk  (12/05/2023)   Overall Financial Resource Strain (CARDIA)    Difficulty of Paying Living Expenses: Not hard at all  Food Insecurity: No Food Insecurity (12/05/2023)   Hunger Vital Sign    Worried About Running Out of Food in the Last Year: Never true    Ran Out of Food in the Last Year: Never true  Transportation Needs: No Transportation Needs (12/05/2023)   PRAPARE - Administrator, Civil Service (Medical): No    Lack of Transportation (Non-Medical): No  Physical Activity: Unknown (12/05/2023)   Exercise Vital Sign    Days of Exercise per Week: Not on file    Minutes of Exercise per Session: Patient declined  Stress: No Stress Concern Present (12/05/2023)   [employer] of Occupational Health - Occupational Stress Questionnaire    Feeling of Stress : Only a little  Social Connections: Socially Isolated (12/05/2023)   Social Connection and Isolation Panel    Frequency of Communication with Friends and Family: Once a week    Frequency of Social Gatherings with Friends and Family: Once a week    Attends Religious Services: Never    Database administrator  or Organizations: No    Attends Banker Meetings: Never  Marital Status: Married    Review of Systems: Gen: Denies fever, chills, anorexia. Denies fatigue, weakness, weight loss.  CV: Denies chest pain, palpitations, syncope, peripheral edema, and claudication. Resp: Denies dyspnea at rest, cough, wheezing, coughing up blood, and pleurisy. GI: Denies vomiting blood, jaundice, and fecal incontinence.   Denies dysphagia or odynophagia. Derm: Denies rash, itching, dry skin Psych: Denies depression, anxiety, memory loss, confusion. No homicidal or suicidal ideation.  Heme: Denies bruising, bleeding, and enlarged lymph nodes.  Physical Exam: There were no vitals taken for this visit. General:   Alert and oriented. No distress noted. Pleasant and cooperative.  Head:  Normocephalic and atraumatic. Eyes:  Conjuctiva clear without scleral icterus. Heart:  S1, S2 present without murmurs appreciated. Lungs:  Clear to auscultation bilaterally. No wheezes, rales, or rhonchi. No distress.  Abdomen:  +BS, soft, non-tender and non-distended. No rebound or guarding. No HSM or masses noted. Msk:  Symmetrical without gross deformities. Normal posture. Extremities:  Without edema. Neurologic:  Alert and  oriented x4 Psych:  Normal mood and affect.    Assessment:     Plan:  ***   Josette Centers, PA-C [facility] 03/14/2024

## 2024-03-14 ENCOUNTER — Encounter: Payer: Self-pay | Admitting: Gastroenterology

## 2024-03-14 ENCOUNTER — Ambulatory Visit: Admitting: Gastroenterology

## 2024-03-14 VITALS — BP 113/71 | HR 80 | Temp 97.8°F | Ht 66.0 in | Wt 146.8 lb

## 2024-03-14 DIAGNOSIS — K219 Gastro-esophageal reflux disease without esophagitis: Secondary | ICD-10-CM | POA: Diagnosis not present

## 2024-03-14 DIAGNOSIS — R1310 Dysphagia, unspecified: Secondary | ICD-10-CM | POA: Diagnosis not present

## 2024-03-14 DIAGNOSIS — K449 Diaphragmatic hernia without obstruction or gangrene: Secondary | ICD-10-CM

## 2024-03-14 NOTE — Patient Instructions (Signed)
 We will get you scheduled for an upper endoscopy with stretching of your esophagus in the near future with Dr. Shaaron at [facility]. We are reaching out to your cardiologist to get approval to hold Coumadin  for 5 days prior to your procedure. You will also need to hold Jardiance  for 3 days prior to your procedure.  Continue pantoprazole  40 mg daily.  Swallowing precautions:  Eat slowly, take small bites, chew thoroughly, drink plenty of liquids throughout meals.  Avoid trough textures All meats should be chopped finely.  If something gets hung in your esophagus and will not come up or go down, proceed to the emergency room.   Josette Centers, PA-C [facility]

## 2024-03-17 ENCOUNTER — Telehealth: Payer: Self-pay | Admitting: *Deleted

## 2024-03-17 ENCOUNTER — Encounter: Payer: Self-pay | Admitting: Dermatology

## 2024-03-17 ENCOUNTER — Ambulatory Visit: Admitting: Dermatology

## 2024-03-17 VITALS — BP 112/64 | HR 70 | Temp 97.9°F

## 2024-03-17 DIAGNOSIS — L814 Other melanin hyperpigmentation: Secondary | ICD-10-CM | POA: Diagnosis not present

## 2024-03-17 DIAGNOSIS — L579 Skin changes due to chronic exposure to nonionizing radiation, unspecified: Secondary | ICD-10-CM

## 2024-03-17 DIAGNOSIS — C434 Malignant melanoma of scalp and neck: Secondary | ICD-10-CM

## 2024-03-17 DIAGNOSIS — C439 Malignant melanoma of skin, unspecified: Secondary | ICD-10-CM

## 2024-03-17 NOTE — Telephone Encounter (Signed)
  Request for patient to stop medication prior to procedure or is needing cleareance  03/17/24  [name] [birth year]  What type of surgery is being performed? EGD/DILATION  When is surgery scheduled? TBD  What type of clearance is required (medical or pharmacy to hold medication or both? MEDICATION  Are there any medications that need to be held prior to surgery and how long? COUMADIN  X 5 DAYS PRIOR  Name of physician performing surgery?  Dr. Shaaron [facility] at [facility] Phone: [phone] Fax: [phone]  Anethesia type (none, local, MAC, general)? MAC

## 2024-03-17 NOTE — Progress Notes (Signed)
 Follow-Up Visit   Subjective  [name] is a 78 y.o. male who presents for the following: Mohs of on an Invasive Melanoma (0.05 mm) on the right anterior neck, biopsied by Dr. Corey. Patient is accompanied by his wife.   The following portions of the chart were reviewed this encounter and updated as appropriate: medications, allergies, medical history  Review of Systems:  No other skin or systemic complaints except as noted in HPI or Assessment and Plan.  Objective  Well appearing patient in no apparent distress; mood and affect are within normal limits.  A focused examination was performed of the following areas: Right anterior neck Relevant physical exam findings are noted in the Assessment and Plan.   Right Anterior Neck Healing biopsy site   Assessment & Plan   MALIGNANT MELANOMA OF SKIN [facility]) Right Anterior Neck Mohs surgery  Consent obtained: written  Anticoagulation: Was the anticoagulation regimen changed prior to Mohs? No    Anesthesia: Anesthesia method: local infiltration Local anesthetic: lidocaine  1% WITH epi  Procedure Details: Timeout: pre-procedure verification complete Procedure Prep: patient was prepped and draped in usual sterile fashion Prep type: chlorhexidine  Pre-Op diagnosis: melanoma Melanoma subtype: invasive Melanoma Breslow depth (mm): 1 MohsAIQ Surgical site (if tumor spans multiple areas, please select predominant area): neck Surgery side: right Surgical site (from skin exam): Right Anterior Neck Pre-operative length (cm): 0.7 Pre-operative width (cm): 0.6 Indications for Mohs surgery: anatomic location where tissue conservation is critical and aggressive histology  Micrographic Surgery Details: Post-operative length (cm): 1.8 Post-operative width (cm): 1.3 Number of Mohs stages: 1 Post surgery depth of defect: subcutaneous fat  Stage 1    Tumor features identified on Mohs section: no tumor identified  Reconstruction: Was  the defect reconstructed? Yes   Was reconstruction performed by the same Mohs surgeon? Yes   Setting of reconstruction: outpatient office When was reconstruction performed? same day Type of reconstruction: linear Linear reconstruction: complex  Skin repair Complexity:  Complex Final length (cm):  3.8 Informed consent: discussed and consent obtained   Timeout: patient name, date of birth, surgical site, and procedure verified   Procedure prep:  Patient was prepped and draped in usual sterile fashion Prep type:  Chlorhexidine  Anesthesia: the lesion was anesthetized in a standard fashion   Anesthetic:  1% lidocaine  w/ epinephrine  1-100,000 buffered w/ 8.4% NaHCO3 Reason for type of repair: reduce tension to allow closure, allow closure of the large defect, preserve normal anatomy and allow side-to-side closure without requiring a flap or graft   Undermining: area extensively undermined   Subcutaneous layers (deep stitches):  Suture size:  4-0 Suture type: Vicryl (polyglactin 910)   Stitches:  Buried vertical mattress Fine/surface layer approximation (top stitches):  Suture size:  6-0 Suture type: fast-absorbing plain gut   Stitches: simple running   Hemostasis achieved with: suture, pressure and electrodesiccation Outcome: patient tolerated procedure well with no complications   Post-procedure details: sterile dressing applied and wound care instructions given   Dressing type: bandage and pressure dressing      Return in about 4 weeks (around 04/14/2024) for wound check.  [name], CMA, am acting as scribe for RUFUS CHRISTELLA COREY, MD.    03/17/2024  HISTORY OF PRESENT ILLNESS  [name] is seen in consultation at the request of Dr. Corey for biopsy-proven Invasive Melanoma (0.5 mm) of the right neck. They note that the area has been present for about 1 year increasing in size with time.  There  is no history of previous treatment.  Reports no other new or changing lesions and  has no other complaints today.  Medications and allergies: see patient chart.  Review of systems: Reviewed 8 systems and notable for the above skin cancer.  All other systems reviewed are unremarkable/negative, unless noted in the HPI. Past medical history, surgical history, family history, social history were also reviewed and are noted in the chart/questionnaire.    PHYSICAL EXAMINATION  General: Well-appearing, in no acute distress, alert and oriented x 4. Vitals reviewed in chart (if available).   Skin: Exam reveals a 0.7 x 0.6 cm erythematous papule and biopsy scar on the right neck. There are rhytids, telangiectasias, and lentigines, consistent with photodamage.  Biopsy report(s) reviewed, confirming the diagnosis.   ASSESSMENT  1) Invasive Melanoma of the right neck 2) photodamage 3) solar lentigines   PLAN   1. Due to location, size, histology, or recurrence and the likelihood of subclinical extension as well as the need to conserve normal surrounding tissue, the patient was deemed acceptable for Mohs micrographic surgery (MMS).  The nature and purpose of the procedure, associated benefits and risks including recurrence and scarring, possible complications such as pain, infection, and bleeding, and alternative methods of treatment if appropriate were discussed with the patient during consent. The lesion location was verified by the patient, by reviewing previous notes, pathology reports, and by photographs as well as angulation measurements if available.  Informed consent was reviewed and signed by the patient, and timeout was performed at 8:15 AM. See op note below.  2. For the photodamage and solar lentigines, sun protection discussed/information given on OTC sunscreens, and we recommend continued regular follow-up with primary dermatologist every 6 months or sooner for any growing, bleeding, or changing lesions. 3. Prognosis and future surveillance discussed. 4. Letter with  treatment outcome sent to referring provider. 5. Pain acetaminophen /ibuprofen   MOHS MICROGRAPHIC SURGERY AND RECONSTRUCTION  Initial size:   0.7 x 0.6 cm Surgical defect/wound size: 1.8 x 1.3 cm Anesthesia:    0.33% lidocaine  with 1:200,000 epinephrine  EBL:    <5 mL Complications:  None Repair type:   Complex SQ suture:   5-0 Monocryl Cutaneous suture:  6-0 Plain gut Final size of the repair: 3.8 cm  Stages:1    STAGE I: Anesthesia achieved with 0.5% lidocaine  with 1:200,000 epinephrine . ChloraPrep applied. 1 section(s) excised using Mohs technique (this includes total peripheral and deep tissue margin excision and evaluation with frozen sections, excised and interpreted by the same physician). The tumor was first debulked and then excised with an approx. 2mm margin.  Hemostasis was achieved with electrocautery as needed.  The specimen was then oriented, subdivided/relaxed, inked, and processed using Mohs technique.  Tissue was stained with H&E and MART-1 with 2 chromogens (2 immunostains).  Frozen section analysis revealed a clear deep and peripheral margin.   Reconstruction  The surgical wound was then cleaned, prepped, and re-anesthetized as above. Wound edges were undermined extensively along at least one entire edge and at a distance equal to or greater than the width of the defect (see wound defect size above) in order to achieve closure and decrease wound tension and anatomic distortion. Redundant tissue repair including standing cone removal was performed. Hemostasis was achieved with electrocautery. Subcutaneous and epidermal tissues were approximated with the above sutures. The surgical site was then lightly scrubbed with sterile, saline-soaked gauze. the area was then bandaged using Vaseline ointment, non-adherent gauze, gauze pads, and tape to provide an adequate  pressure dressing. The patient tolerated the procedure well, was given detailed written and verbal wound care  instructions, and was discharged in good condition.   The patient will follow-up: 4 weeks.    Documentation: I have reviewed the above documentation for accuracy and completeness, and I agree with the above.  RUFUS CHRISTELLA HOLY, MD

## 2024-03-17 NOTE — Patient Instructions (Signed)

## 2024-03-20 ENCOUNTER — Other Ambulatory Visit: Payer: Self-pay | Admitting: Nurse Practitioner

## 2024-03-23 NOTE — Telephone Encounter (Signed)
 Patient with diagnosis of atrial fibrillation on warfarin for anticoagulation.    What type of surgery is being performed? EGD/DILATION   When is surgery scheduled? TBD   CHA2DS2-VASc Score = 5   This indicates a 7.2% annual risk of stroke. The patient's score is based upon: CHF History: 1 HTN History: 1 Diabetes History: 0 Stroke History: 0 Vascular Disease History: 1 Age Score: 2 Gender Score: 0   CrCl 51 Platelet count 207  Patient has not had an Afib/aflutter ablation within the last 3 months or DCCV within the last 30 days  Per office protocol, patient can hold warfarin for 5 days prior to procedure.   Patient will not need bridging with Lovenox  (enoxaparin ) around procedure.  **This guidance is not considered finalized until pre-operative APP has relayed final recommendations.**

## 2024-03-24 NOTE — Telephone Encounter (Signed)
 Patient has been cleared to hold warfarin for 5 days prior to procedure.  No need for bridging.  Please proceed with scheduling.

## 2024-03-24 NOTE — Telephone Encounter (Signed)
   Patient Name: [name]  DOB: July 04, 1946 MRN: [id number]  Primary Cardiologist: Ralph Carrier, MD  Clinical pharmacists have reviewed the patient's past medical history, labs, and current medications as part of preoperative protocol coverage. The following recommendations have been made:   Patient has not had an Afib/aflutter ablation within the last 3 months or DCCV within the last 30 days   Per office protocol, patient can hold warfarin for 5 days prior to procedure.   Patient will not need bridging with Lovenox  (enoxaparin ) around procedure.   I will route this recommendation to the requesting party via Epic fax function and remove from pre-op pool.  Please call with questions.  Ralph LITTIE Louis, NP 03/24/2024, 8:19 AM

## 2024-03-24 NOTE — Telephone Encounter (Signed)
LMOVM to call back for pt ?

## 2024-03-25 ENCOUNTER — Encounter: Payer: Self-pay | Admitting: Dermatology

## 2024-03-25 ENCOUNTER — Encounter: Payer: Self-pay | Admitting: *Deleted

## 2024-03-25 NOTE — Telephone Encounter (Signed)
 Pt spouse called back. Scheduled for 9/17. Aware will call back with pre-op appt. Instructions to be mailed. Aware medications to hold.

## 2024-03-28 ENCOUNTER — Other Ambulatory Visit: Payer: Self-pay | Admitting: Cardiology

## 2024-04-14 ENCOUNTER — Ambulatory Visit (HOSPITAL_COMMUNITY)
Admission: RE | Admit: 2024-04-14 | Discharge: 2024-04-14 | Disposition: A | Discharge status: Home / Self Care | Source: Ambulatory Visit | Attending: Family Medicine | Admitting: Family Medicine

## 2024-04-14 DIAGNOSIS — Z87891 Personal history of nicotine dependence: Secondary | ICD-10-CM | POA: Insufficient documentation

## 2024-04-14 DIAGNOSIS — F17210 Nicotine dependence, cigarettes, uncomplicated: Secondary | ICD-10-CM | POA: Diagnosis present

## 2024-04-14 DIAGNOSIS — Z122 Encounter for screening for malignant neoplasm of respiratory organs: Secondary | ICD-10-CM | POA: Insufficient documentation

## 2024-04-16 ENCOUNTER — Ambulatory Visit: Admitting: Dermatology

## 2024-04-17 NOTE — Patient Instructions (Signed)
 20    Your procedure is scheduled on: 04/23/2024  Report to [facility] Main Entrance at    7:00 AM.  Call this number if you have problems the morning of surgery: [phone]   Remember:   Follow instructions on letter from office regarding when to stop eating and drinking  Hold Coumadin  5 days as instructed in letter Hold Jardiance  3 days as instructed in letter         No Smoking the day of procedure  Use inhalers if need      Take these medicines the morning of surgery with A SIP OF WATER : Amlodipine , Metoprolol , and Pantoprazole    Do not wear jewelry, make-up or nail polish.  Do not wear lotions, powders, or perfumes. You may wear deodorant.                Do not bring valuables to the hospital.  Contacts, dentures or bridgework may not be worn into surgery.  Leave suitcase in the car. After surgery it may be brought to your room.  For patients admitted to the hospital, checkout time is 11:00 AM the day of discharge.   Patients discharged the day of surgery will not be allowed to drive home. Upper Endoscopy, Adult Upper endoscopy is a procedure to look inside the upper GI (gastrointestinal) tract. The upper GI tract is made up of: The part of the body that moves food from your mouth to your stomach (esophagus). The stomach. The first part of your small intestine (duodenum). This procedure is also called esophagogastroduodenoscopy (EGD) or gastroscopy. In this procedure, your health care provider passes a thin, flexible tube (endoscope) through your mouth and down your esophagus into your stomach. A small camera is attached to the end of the tube. Images from the camera appear on a monitor in the exam room. During this procedure, your health care provider may also remove a small piece of tissue to be sent to a lab and examined under a microscope (biopsy). Your health care provider may do an upper endoscopy to diagnose cancers of the upper GI tract. You may also have this procedure to  find the cause of other conditions, such as: Stomach pain. Heartburn. Pain or problems when swallowing. Nausea and vomiting. Stomach bleeding. Stomach ulcers. Tell a health care provider about: Any allergies you have. All medicines you are taking, including vitamins, herbs, eye drops, creams, and over-the-counter medicines. Any problems you or family members have had with anesthetic medicines. Any blood disorders you have. Any surgeries you have had. Any medical conditions you have. Whether you are pregnant or may be pregnant. What are the risks? Generally, this is a safe procedure. However, problems may occur, including: Infection. Bleeding. Allergic reactions to medicines. A tear or hole (perforation) in the esophagus, stomach, or duodenum. What happens before the procedure? Staying hydrated Follow instructions from your health care provider about hydration, which may include: Up to 4 hours before the procedure - you may continue to drink clear liquids, such as water , clear fruit juice, black coffee, and plain tea.   Medicines Ask your health care provider about: Changing or stopping your regular medicines. This is especially important if you are taking diabetes medicines or blood thinners. Taking medicines such as aspirin  and ibuprofen. These medicines can thin your blood. Do not take these medicines unless your health care provider tells you to take them. Taking over-the-counter medicines, vitamins, herbs, and supplements. General instructions Plan to have someone take you home from the  hospital or clinic. If you will be going home right after the procedure, plan to have someone with you for 24 hours. Ask your health care provider what steps will be taken to help prevent infection. What happens during the procedure?  An IV will be inserted into one of your veins. You may be given one or more of the following: A medicine to help you relax (sedative). A medicine to numb the  throat (local anesthetic). You will lie on your left side on an exam table. Your health care provider will pass the endoscope through your mouth and down your esophagus. Your health care provider will use the scope to check the inside of your esophagus, stomach, and duodenum. Biopsies may be taken. The endoscope will be removed. The procedure may vary among health care providers and hospitals. What happens after the procedure? Your blood pressure, heart rate, breathing rate, and blood oxygen level will be monitored until you leave the hospital or clinic. Do not drive for 24 hours if you were given a sedative during your procedure. When your throat is no longer numb, you may be given some fluids to drink. It is up to you to get the results of your procedure. Ask your health care provider, or the department that is doing the procedure, when your results will be ready. Summary Upper endoscopy is a procedure to look inside the upper GI tract. During the procedure, an IV will be inserted into one of your veins. You may be given a medicine to help you relax. A medicine will be used to numb your throat. The endoscope will be passed through your mouth and down your esophagus. This information is not intended to replace advice given to you by your health care provider. Make sure you discuss any questions you have with your health care provider. Document Revised: 01/16/2018 Document Reviewed: 12/24/2017 Elsevier Patient Education  2020 [employer]                                                                                                                                      EndoscopyCare After  Please read the instructions outlined below and refer to this sheet in the next few weeks. These discharge instructions provide you with general information on caring for yourself after you leave the hospital. Your doctor may also give you specific instructions. While your treatment has been planned  according to the most current medical practices available, unavoidable complications occasionally occur. If you have any problems or questions after discharge, please call your doctor. HOME CARE INSTRUCTIONS Activity You may resume your regular activity but move at a slower pace for the next 24 hours.  Take frequent rest periods for the next 24 hours.  Walking will help expel (get rid of) the air and reduce the bloated feeling in your abdomen.  No driving for 24 hours (because of the anesthesia (medicine) used during the test).  You may shower.  Do not sign any important legal documents or operate any machinery for 24 hours (because of the anesthesia used during the test).  Nutrition Drink plenty of fluids.  You may resume your normal diet.  Begin with a light meal and progress to your normal diet.  Avoid alcoholic beverages for 24 hours or as instructed by your caregiver.  Medications You may resume your normal medications unless your caregiver tells you otherwise. What you can expect today You may experience abdominal discomfort such as a feeling of fullness or gas pains.  You may experience a sore throat for 2 to 3 days. This is normal. Gargling with salt water  may help this.  Follow-up Your doctor will discuss the results of your test with you. SEEK IMMEDIATE MEDICAL CARE IF: You have excessive nausea (feeling sick to your stomach) and/or vomiting.  You have severe abdominal pain and distention (swelling).  You have trouble swallowing.  You have a temperature over 100 F (37.8 C).  You have rectal bleeding or vomiting of blood.  Document Released: 03/07/2004 Document Revised: 07/13/2011 Document Reviewed: 09/18/2007 Esophageal Dilatation Esophageal dilatation, or dilation, is done to stretch a blocked or narrowed part of your esophagus. The esophagus is the part of your body that moves food from your mouth to your stomach. You may need to have it stretched if: You have a lot of  scar tissue and it makes it hard or painful to swallow. You have cancer of the esophagus. There's a problem with how food moves through your esophagus. In some cases, you may need to have this procedure done more than once. Tell a health care provider about: Any allergies you have. All medicines you're taking, including vitamins, herbs, eye drops, creams, and over-the-counter medicines. Any problems you or family members have had with anesthesia. Any bleeding problems you have. Any surgeries you've had. Any medical conditions you have. Whether you're pregnant or may be pregnant. What are the risks? Your health care provider will talk with you about risks. These may include: Bleeding. A hole or tear in your esophagus. What happens before the procedure? When to stop eating and drinking Follow instructions from your provider about what you may eat and drink. These may include: 8 hours before your procedure Stop eating most foods. Do not eat meat, fried foods, or fatty foods. Eat only light foods, such as toast or crackers. All liquids are okay except energy drinks and alcohol. 6 hours before your procedure Stop eating. Drink only clear liquids, such as water , clear fruit juice, black coffee, plain tea, and sports drinks. Do not drink energy drinks or alcohol. 2 hours before your procedure Stop drinking all liquids. You may be allowed to take medicines with small sips of water . If you don't follow your provider's instructions, your procedure may be delayed or canceled. Medicines Ask your provider about: Changing or stopping your regular medicines. These include any diabetes medicines or blood thinners you take. Taking medicines such as aspirin  and ibuprofen. These medicines can thin your blood. Do not take them unless your provider tells you to. Taking over-the-counter medicines, vitamins, herbs, and supplements. General instructions If you'll be going home right after the procedure,  plan to have a responsible adult: Take you home from the hospital or clinic. You won't be allowed to drive. Care for you for the time you're told. What happens during the procedure? You may be given: A sedative. This helps you relax. Anesthesia. This keeps you from feeling  pain. It will numb certain areas of your body. The stretching may be done with: Simple dilators. These are tools put in your esophagus to stretch it. Guide wires. These wires are put in using a tube called an endoscope. A dilator is put over the wires to stretch your esophagus. Then the wires are taken out. A balloon. The balloon is on the end of a tube. It's inflated to stretch your esophagus. The procedure may vary among providers and hospitals. What can I expect after the procedure? Your blood pressure, heart rate, breathing rate, and blood oxygen level will be monitored until you leave the hospital or clinic. Your throat may feel sore and numb. This will get better over time. You won't be allowed to eat or drink until your throat is no longer numb. You may be able to go home when you can: Drink. Pee. Sit on the edge of the bed without nausea or dizziness. Follow these instructions at home: Activity If you were given a sedative during the procedure, it can affect you for several hours. Do not drive or operate machinery until your provider says it's safe. Return to your normal activities as told by your provider. Ask your provider what activities are safe for you. General instructions Take over-the-counter and prescription medicines only as told by your provider. Follow instructions from your provider about what you may eat and drink. Do not use any products that contain nicotine  or tobacco. These products include cigarettes, chewing tobacco, and vaping devices, such as e-cigarettes. If you need help quitting, ask your provider. Keep all follow-up visits. Your provider will make sure the procedure worked. Where to  find more information American Society for Gastrointestinal Endoscopy (ASGE): asge.org Contact a health care provider if: You have trouble swallowing. You have a fever. Your pain doesn't get better with medicine. Get help right away if: You have chest pain. You have trouble breathing. You vomit blood. Your poop is: Black. Tarry. Bloody. These symptoms may be an emergency. Get help right away. Call 911. Do not wait to see if the symptoms will go away. Do not drive yourself to the hospital. This information is not intended to replace advice given to you by your health care provider. Make sure you discuss any questions you have with your health care provider. Document Revised: 10/20/2022 Document Reviewed: 10/20/2022 Elsevier Patient Education  2024 [employer].

## 2024-04-18 ENCOUNTER — Encounter (HOSPITAL_COMMUNITY): Payer: Self-pay

## 2024-04-18 ENCOUNTER — Encounter (HOSPITAL_COMMUNITY)
Admission: RE | Admit: 2024-04-18 | Discharge: 2024-04-18 | Disposition: A | Discharge status: Home / Self Care | Source: Ambulatory Visit | Attending: Internal Medicine | Admitting: Internal Medicine

## 2024-04-18 VITALS — BP 86/67 | HR 68 | Resp 18 | Ht 66.0 in | Wt 147.0 lb

## 2024-04-18 DIAGNOSIS — Z79899 Other long term (current) drug therapy: Secondary | ICD-10-CM | POA: Insufficient documentation

## 2024-04-18 DIAGNOSIS — D649 Anemia, unspecified: Secondary | ICD-10-CM | POA: Insufficient documentation

## 2024-04-18 DIAGNOSIS — Z01812 Encounter for preprocedural laboratory examination: Secondary | ICD-10-CM | POA: Diagnosis present

## 2024-04-18 LAB — BASIC METABOLIC PANEL WITH GFR
Anion gap: 9 (ref 5–15)
BUN: 16 mg/dL (ref 8–23)
CO2: 23 mmol/L (ref 22–32)
Calcium: 9 mg/dL (ref 8.9–10.3)
Chloride: 102 mmol/L (ref 98–111)
Creatinine, Ser: 1.11 mg/dL (ref 0.61–1.24)
GFR, Estimated: >60 mL/min (ref >60)
Glucose, Bld: 83 mg/dL (ref 70–99)
Potassium: 4.1 mmol/L (ref 3.5–5.1)
Sodium: 134 mmol/L — ABNORMAL LOW (ref 135–145)

## 2024-04-18 LAB — CBC WITH DIFFERENTIAL/PLATELET
Abs Immature Granulocytes: 0.03 K/uL (ref 0.00–0.07)
Basophils Absolute: 0 K/uL (ref 0.0–0.1)
Basophils Relative: 1 %
Eosinophils Absolute: 0.2 K/uL (ref 0.0–0.5)
Eosinophils Relative: 2 %
HCT: 40.6 % (ref 39.0–52.0)
Hemoglobin: 13.6 g/dL (ref 13.0–17.0)
Immature Granulocytes: 0 %
Lymphocytes Relative: 33 %
Lymphs Abs: 2.4 K/uL (ref 0.7–4.0)
MCH: 33.7 pg (ref 26.0–34.0)
MCHC: 33.5 g/dL (ref 30.0–36.0)
MCV: 100.5 fL — ABNORMAL HIGH (ref 80.0–100.0)
Monocytes Absolute: 0.5 K/uL (ref 0.1–1.0)
Monocytes Relative: 7 %
Neutro Abs: 4.2 K/uL (ref 1.7–7.7)
Neutrophils Relative %: 57 %
Platelets: 199 K/uL (ref 150–400)
RBC: 4.04 MIL/uL — ABNORMAL LOW (ref 4.22–5.81)
RDW: 14.3 % (ref 11.5–15.5)
WBC: 7.4 K/uL (ref 4.0–10.5)
nRBC: 0 % (ref 0.0–0.2)

## 2024-04-21 ENCOUNTER — Encounter

## 2024-04-21 ENCOUNTER — Telehealth: Payer: Self-pay | Admitting: Internal Medicine

## 2024-04-21 NOTE — Telephone Encounter (Signed)
 Pt wife called in and stated that pt is having surgery Thurs  and wanted to know is he need to come in today for his Coum appt?    Best number [phone]

## 2024-04-21 NOTE — Telephone Encounter (Signed)
 L.Reid,RN has handled

## 2024-04-21 NOTE — Telephone Encounter (Signed)
 I sent secure chat to L.Reid,RN

## 2024-04-23 ENCOUNTER — Encounter

## 2024-04-23 ENCOUNTER — Ambulatory Visit (HOSPITAL_COMMUNITY)
Admission: RE | Admit: 2024-04-23 | Discharge: 2024-04-23 | Disposition: A | Discharge status: Home / Self Care | Attending: Internal Medicine | Admitting: Internal Medicine

## 2024-04-23 ENCOUNTER — Other Ambulatory Visit: Payer: Self-pay

## 2024-04-23 ENCOUNTER — Encounter (HOSPITAL_COMMUNITY): Payer: Self-pay | Admitting: Internal Medicine

## 2024-04-23 ENCOUNTER — Ambulatory Visit (HOSPITAL_COMMUNITY): Admitting: Anesthesiology

## 2024-04-23 ENCOUNTER — Encounter (HOSPITAL_COMMUNITY)
Admission: RE | Disposition: A | Discharge status: Home / Self Care | Payer: Self-pay | Source: Home / Self Care | Attending: Internal Medicine

## 2024-04-23 DIAGNOSIS — Z8551 Personal history of malignant neoplasm of bladder: Secondary | ICD-10-CM | POA: Insufficient documentation

## 2024-04-23 DIAGNOSIS — E7800 Pure hypercholesterolemia, unspecified: Secondary | ICD-10-CM | POA: Diagnosis not present

## 2024-04-23 DIAGNOSIS — K2289 Other specified disease of esophagus: Secondary | ICD-10-CM

## 2024-04-23 DIAGNOSIS — I2510 Atherosclerotic heart disease of native coronary artery without angina pectoris: Secondary | ICD-10-CM | POA: Insufficient documentation

## 2024-04-23 DIAGNOSIS — I4891 Unspecified atrial fibrillation: Secondary | ICD-10-CM | POA: Diagnosis not present

## 2024-04-23 DIAGNOSIS — I10 Essential (primary) hypertension: Secondary | ICD-10-CM | POA: Diagnosis not present

## 2024-04-23 DIAGNOSIS — I110 Hypertensive heart disease with heart failure: Secondary | ICD-10-CM | POA: Diagnosis not present

## 2024-04-23 DIAGNOSIS — K219 Gastro-esophageal reflux disease without esophagitis: Secondary | ICD-10-CM | POA: Insufficient documentation

## 2024-04-23 DIAGNOSIS — K222 Esophageal obstruction: Secondary | ICD-10-CM

## 2024-04-23 DIAGNOSIS — K2950 Unspecified chronic gastritis without bleeding: Secondary | ICD-10-CM | POA: Diagnosis not present

## 2024-04-23 DIAGNOSIS — Z8546 Personal history of malignant neoplasm of prostate: Secondary | ICD-10-CM | POA: Diagnosis not present

## 2024-04-23 DIAGNOSIS — K2970 Gastritis, unspecified, without bleeding: Secondary | ICD-10-CM

## 2024-04-23 DIAGNOSIS — F17200 Nicotine dependence, unspecified, uncomplicated: Secondary | ICD-10-CM | POA: Insufficient documentation

## 2024-04-23 DIAGNOSIS — I4892 Unspecified atrial flutter: Secondary | ICD-10-CM | POA: Insufficient documentation

## 2024-04-23 DIAGNOSIS — J449 Chronic obstructive pulmonary disease, unspecified: Secondary | ICD-10-CM | POA: Diagnosis not present

## 2024-04-23 DIAGNOSIS — R1314 Dysphagia, pharyngoesophageal phase: Secondary | ICD-10-CM | POA: Diagnosis present

## 2024-04-23 DIAGNOSIS — I5022 Chronic systolic (congestive) heart failure: Secondary | ICD-10-CM

## 2024-04-23 DIAGNOSIS — K3189 Other diseases of stomach and duodenum: Secondary | ICD-10-CM | POA: Diagnosis not present

## 2024-04-23 DIAGNOSIS — R1310 Dysphagia, unspecified: Secondary | ICD-10-CM | POA: Diagnosis not present

## 2024-04-23 DIAGNOSIS — I252 Old myocardial infarction: Secondary | ICD-10-CM | POA: Diagnosis not present

## 2024-04-23 HISTORY — PX: ESOPHAGOGASTRODUODENOSCOPY: SHX5428

## 2024-04-23 HISTORY — PX: ESOPHAGEAL DILATION: SHX303

## 2024-04-23 SURGERY — EGD (ESOPHAGOGASTRODUODENOSCOPY)
Anesthesia: General

## 2024-04-23 MED ORDER — LIDOCAINE 2% (20 MG/ML) 5 ML SYRINGE
INTRAMUSCULAR | Status: DC | PRN
  Administered 2024-04-23: 50 mg via INTRAVENOUS

## 2024-04-23 MED ORDER — PHENYLEPHRINE 80 MCG/ML (10ML) SYRINGE FOR IV PUSH (FOR BLOOD PRESSURE SUPPORT)
PREFILLED_SYRINGE | INTRAVENOUS | Status: DC | PRN
  Administered 2024-04-23 (×2): 80 ug via INTRAVENOUS

## 2024-04-23 MED ORDER — PROPOFOL 500 MG/50ML IV EMUL
INTRAVENOUS | Status: DC | PRN
  Administered 2024-04-23: 150 ug/kg/min via INTRAVENOUS

## 2024-04-23 MED ORDER — PROPOFOL 10 MG/ML IV BOLUS
INTRAVENOUS | Status: DC | PRN
  Administered 2024-04-23: 60 mg via INTRAVENOUS

## 2024-04-23 MED ORDER — LACTATED RINGERS IV SOLN: INTRAVENOUS | Status: DC | PRN

## 2024-04-23 NOTE — Transfer of Care (Signed)
 Immediate Anesthesia Transfer of Care Note  Patient: [name]  Procedure(s) Performed: EGD (ESOPHAGOGASTRODUODENOSCOPY) DILATION, ESOPHAGUS  Patient Location: Short Stay  Anesthesia Type:General  Level of Consciousness: awake  Airway & Oxygen Therapy: Patient Spontanous Breathing  Post-op Assessment: Report given to RN  Post vital signs: Reviewed and stable  Last Vitals:  Vitals Value Taken Time  BP    Temp    Pulse    Resp    SpO2      Last Pain:  Vitals:   04/23/24 0931  TempSrc:   PainSc: 0-No pain      Patients Stated Pain Goal: 9 (04/23/24 0747)  Complications: No notable events documented.

## 2024-04-23 NOTE — Discharge Instructions (Addendum)
 EGD Discharge instructions Please read the instructions outlined below and refer to this sheet in the next few weeks. These discharge instructions provide you with general information on caring for yourself after you leave the hospital. Your doctor may also give you specific instructions. While your treatment has been planned according to the most current medical practices available, unavoidable complications occasionally occur. If you have any problems or questions after discharge, please call your doctor. ACTIVITY You may resume your regular activity but move at a slower pace for the next 24 hours.  Take frequent rest periods for the next 24 hours.  Walking will help expel (get rid of) the air and reduce the bloated feeling in your abdomen.  No driving for 24 hours (because of the anesthesia (medicine) used during the test).  You may shower.  Do not sign any important legal documents or operate any machinery for 24 hours (because of the anesthesia used during the test).  NUTRITION Drink plenty of fluids.  You may resume your normal diet.  Begin with a light meal and progress to your normal diet.  Avoid alcoholic beverages for 24 hours or as instructed by your caregiver.  MEDICATIONS You may resume your normal medications unless your caregiver tells you otherwise.  WHAT YOU CAN EXPECT TODAY You may experience abdominal discomfort such as a feeling of fullness or "gas" pains.  FOLLOW-UP Your doctor will discuss the results of your test with you.  SEEK IMMEDIATE MEDICAL ATTENTION IF ANY OF THE FOLLOWING OCCUR: Excessive nausea (feeling sick to your stomach) and/or vomiting.  Severe abdominal pain and distention (swelling).  Trouble swallowing.  Temperature over 101 F (37.8 C).  Rectal bleeding or vomiting of blood.       Your esophagus was stretched today.  Biopsies of your esophagus and stomach were taken  Further recommendations to follow  May resume Coumadin  tomorrow   at  patient request, I called Dowell Hoon at [phone] findings and recommendations

## 2024-04-23 NOTE — H&P (Signed)
 @LOGO @   Gastroenterology Progress Note    Primary Care Physician:  Zollie Lowers, MD Primary Gastroenterologist:  Dr. Shaaron  Pre-Procedure History & Physical: HPI:  [name] is a 78 y.o. male here for  further evaluation of  esophageal dysphagia  with a background history of chronic GERD.  Chronically anticoagulated on Coumadin .  Past Medical History:  Diagnosis Date   Anginal pain (HCC)    Atrial fibrillation by electrocardiogram (HCC) 11/2019   Atrial flutter by electrocardiogram [facility]) 11/2019   Bladder cancer (HCC) 01/2020   Bladder tumor    BPH (benign prostatic hyperplasia)    CAD (coronary artery disease)    a. s/p INF MI 1997;  b. s/p CABG 2001;  c. LHC 11/2009:  3v CAD, S-PDA ok with 40% mid, S-OM ok, S-Dx ok, L-LAD ok, EF 50%;  d.  Lex MV 5/14:  Inferolateral scar, EF 46%, no ischemia   COPD (chronic obstructive pulmonary disease) (HCC)    Dyspnea    ETOH abuse    GERD (gastroesophageal reflux disease)    History of gout    Hypercholesterolemia    Hypertension    Myocardial infarction [facility]) 2013   Prostate cancer (HCC)    Tobacco abuse     Past Surgical History:  Procedure Laterality Date   ANTERIOR CERVICAL DECOMPRESSION/DISCECTOMY FUSION 4 LEVELS N/A 07/21/2016   Procedure: ANTERIOR CERVICAL DECOMPRESSION/DISCECTOMY FUSION CERVICAL TWO-THREE, CERVICAL THREE-FOUR,. CERVICAL FOUR-FIVE, CERVICAL  FIVE-SIX;  Surgeon: Rockey Peru, MD;  Location: [facility] OR;  Service: Neurosurgery;  Laterality: N/A;   BACK SURGERY     CARDIAC CATHETERIZATION  2011   CARDIOVERSION N/A 06/10/2020   Procedure: CARDIOVERSION;  Surgeon: Alvan Dorn FALCON, MD;  Location: [facility] ENDO SUITE;  Service: Endoscopy;  Laterality: N/A;   COLONOSCOPY     in remote past, Dr. Jakie. Obtaining records.    COLONOSCOPY N/A 11/21/2017   Surgeon: Shaaron Lamar HERO, MD; Three 4-6 mm polyps in the rectum and splenic flexure resected and retrieved (tubular adenomas), nonbleeding internal hemorrhoids.   Repeat in 5 years.   COLONOSCOPY WITH PROPOFOL  N/A 06/09/2021   Surgeon: Shaaron Lamar HERO, MD;  Three 3-7 mm polyps in the ascending colon resected and retrieved, mild radiation proctitis, otherwise normal exam. Pathology revealed tubular adenomas.  Recommended repeat colonoscopy in 5 years.   CORONARY ANGIOPLASTY     CORONARY ARTERY BYPASS GRAFT     CORONARY STENT INTERVENTION N/A 03/27/2022   Procedure: CORONARY STENT INTERVENTION;  Surgeon: Swaziland, Peter M, MD;  Location[facility] INVASIVE CV LAB;  Service: Cardiovascular;  Laterality: N/A;   ESOPHAGOGASTRODUODENOSCOPY  05/27/2012   MFM:Wnmfjo esophagus, stomach and duodenum s/p dilator   POLYPECTOMY  11/21/2017   Procedure: POLYPECTOMY;  Surgeon: Shaaron Lamar HERO, MD;  Location: [facility] SUITE;  Service: Endoscopy;;  colon   POLYPECTOMY  06/09/2021   Procedure: POLYPECTOMY;  Surgeon: Shaaron Lamar HERO, MD;  Location: [facility] SUITE;  Service: Endoscopy;;   PROSTATE SURGERY     RIGHT/LEFT HEART CATH AND CORONARY/GRAFT ANGIOGRAPHY N/A 03/27/2022   Procedure: RIGHT/LEFT HEART CATH AND CORONARY/GRAFT ANGIOGRAPHY;  Surgeon: Swaziland, Peter M, MD;  Location: [facility] INVASIVE CV LAB;  Service: Cardiovascular;  Laterality: N/A;   TRANSURETHRAL RESECTION OF BLADDER TUMOR WITH MITOMYCIN -C Left 01/06/2020   Procedure: CYSTOSCOPY TRANSURETHRAL RESECTION OF BLADDER TUMOR,LEFT URETERSCOPY AND GEMCITABINE ;  Surgeon: Watt Rush, MD;  Location: [facility] ORS;  Service: Urology;  Laterality: Left;    Prior to Admission medications   Medication Sig Start Date End Date Taking?  Authorizing Provider  acetaminophen  (TYLENOL ) 500 MG tablet Take 2 tablets (1,000 mg total) by mouth every 6 (six) hours as needed for moderate pain or headache. 03/03/20  Yes Zollie Lowers, MD  albuterol  (VENTOLIN  HFA) 108 (90 Base) MCG/ACT inhaler Inhale 2 puffs into the lungs every 6 (six) hours as needed for wheezing or shortness of breath. 06/20/23  Yes Stacks, Lowers, MD  allopurinol  (ZYLOPRIM ) 100 MG  tablet Take 1 tablet (100 mg total) by mouth daily. 06/20/23  Yes Zollie Lowers, MD  amLODipine  (NORVASC ) 5 MG tablet Take 1 tablet (5 mg total) by mouth daily. 01/16/24  Yes Miriam Norris, NP  atorvastatin  (LIPITOR ) 80 MG tablet Take 1 tablet (80 mg total) by mouth daily. 08/21/23  Yes Miriam Norris, NP  B Complex Vitamins (VITAMIN B COMPLEX PO) Take 1 tablet by mouth daily.   Yes [provider]  Budeson-Glycopyrrol-Formoterol  (BREZTRI  AEROSPHERE) 160-9-4.8 MCG/ACT AERO Inhale 2 puffs into the lungs in the morning and at bedtime. 10/04/23  Yes Byrum, Evva Din S, MD  Cholecalciferol (VITAMIN D3) 125 MCG (5000 UT) CAPS Take 5,000 Units by mouth daily.   Yes [provider]  fluticasone  (FLONASE ) 50 MCG/ACT nasal spray Place 2 sprays into both nostrils daily as needed for allergies. 11/24/20  Yes Stacks, Lowers, MD  magnesium  oxide (MAG-OX) 400 (240 Mg) MG tablet Take 1 tablet by mouth daily. 03/29/23  Yes [provider]  metoprolol  succinate (TOPROL  XL) 50 MG 24 hr tablet Take 1.5 tablets (75 mg total) by mouth daily. 03/20/23  Yes Miriam Norris, NP  pantoprazole  (PROTONIX ) 40 MG tablet Take 1 tablet (40 mg total) by mouth daily. 08/21/23  Yes Miriam Norris, NP  Potassium & Sodium Phosphates (PHOSPHORUS  SUPPLEMENT) 280-160-250 MG PACK Take 1 packet by mouth 2 (two) times daily. In water  or juice 12/13/22  Yes Stacks, Lowers, MD  spironolactone  (ALDACTONE ) 25 MG tablet TAKE 1/2 TABLET TWICE DAILY (NEED MD APPOINTMENT FOR REFILLS) 03/28/24  Yes Branch, Dorn FALCON, MD  azelastine  (ASTELIN ) 0.1 % nasal spray Place 2 sprays into both nostrils 2 (two) times daily. Use in each nostril as directed 01/10/24 03/14/24  Tobie Eldora NOVAK, MD  empagliflozin  (JARDIANCE ) 10 MG TABS tablet Take 1 tablet (10 mg total) by mouth daily before breakfast. 06/06/23   Alvan Dorn FALCON, MD  furosemide  (LASIX ) 20 MG tablet Take 1 tablet (20 mg total) by mouth as needed (swelling). 08/21/23   Miriam Norris,  NP  ipratropium (ATROVENT ) 0.06 % nasal spray Place 2 sprays into both nostrils 2 (two) times daily as needed. 02/22/24   Tobie Eldora NOVAK, MD  metoprolol  succinate (TOPROL -XL) 25 MG 24 hr tablet Take 1 tablet (25 mg total) by mouth daily. 02/21/24   Miriam Norris, NP  nitroGLYCERIN  (NITROSTAT ) 0.4 MG SL tablet Place 1 tablet (0.4 mg total) under the tongue every 5 (five) minutes as needed for chest pain. 08/21/23   Miriam Norris, NP  warfarin (COUMADIN ) 5 MG tablet Take 5 mg by mouth daily. Take 1/2 - to 1 tablet daily or as directed by coumadin  clinic. Tuesday and Saturday whole tab    [provider]    Allergies as of 03/25/2024   (No Known Allergies)    Family History  Problem Relation Age of Onset   Diabetes Mother    Hypertension Father    Heart disease Brother    Diabetes Brother    Heart disease Brother    Diabetes Brother    Skin cancer Brother    Multiple  sclerosis Daughter    Healthy Son    Colon cancer Neg Hx    Inflammatory bowel disease Neg Hx     Social History   Socioeconomic History   Marital status: Married    Spouse name: Not on file   Number of children: 2   Years of education: Not on file   Highest education level: Not on file  Occupational History    Comment: retired  Tobacco Use   Smoking status: Every Day    Current packs/day: 1.00    Average packs/day: 1 pack/day for 59.1 years (59.1 ttl pk-yrs)    Types: Cigarettes, E-cigarettes    Start date: 03/08/1970    Passive exposure: Never   Smokeless tobacco: Former    Types: Snuff    Quit date: 03/08/2012   Tobacco comments:    10 - 15 cigarettes smoked daily [facility] 01/19/23  Vaping Use   Vaping status: Former  Substance and Sexual Activity   Alcohol use: Not Currently    Alcohol/week: 2.0 - 4.0 standard drinks of alcohol    Types: 2 - 4 Cans of beer per week    Comment: 2-4 beers per day   Drug use: No   Sexual activity: Yes    Partners: Female  Other Topics Concern   Not on file   Social History Narrative   His brother lives with him and his wife. Daughter lives in [place], son lives nearby.   Social Drivers of Corporate investment banker Strain: Low Risk  (12/05/2023)   Overall Financial Resource Strain (CARDIA)    Difficulty of Paying Living Expenses: Not hard at all  Food Insecurity: No Food Insecurity (12/05/2023)   Hunger Vital Sign    Worried About Running Out of Food in the Last Year: Never true    Ran Out of Food in the Last Year: Never true  Transportation Needs: No Transportation Needs (12/05/2023)   PRAPARE - Administrator, Civil Service (Medical): No    Lack of Transportation (Non-Medical): No  Physical Activity: Unknown (12/05/2023)   Exercise Vital Sign    Days of Exercise per Week: Not on file    Minutes of Exercise per Session: Patient declined  Stress: No Stress Concern Present (12/05/2023)   [employer] of Occupational Health - Occupational Stress Questionnaire    Feeling of Stress : Only a little  Social Connections: Socially Isolated (12/05/2023)   Social Connection and Isolation Panel    Frequency of Communication with Friends and Family: Once a week    Frequency of Social Gatherings with Friends and Family: Once a week    Attends Religious Services: Never    Database administrator or Organizations: No    Attends Banker Meetings: Never    Marital Status: Married  Catering manager Violence: Not At Risk (12/05/2023)   Humiliation, Afraid, Rape, and Kick questionnaire    Fear of Current or Ex-Partner: No    Emotionally Abused: No    Physically Abused: No    Sexually Abused: No    Review of Systems   See HPI, otherwise negative ROS  Physical Exam: BP 121/72   Pulse 67   Temp 97.7 F (36.5 C) (Oral)   Resp (!) 21   SpO2 100%  General:   Alert,  Well-developed, well-nourished, pleasant and cooperative in NAD Neck:  Supple; no masses or thyromegaly. No significant cervical adenopathy. Lungs:  Clear  throughout to auscultation.   No wheezes, crackles, or  rhonchi. No acute distress. Heart:  Regular rate and rhythm; no murmurs, clicks, rubs,  or gallops. Abdomen: Non-distended, normal bowel sounds.  Soft and nontender without appreciable mass or hepatosplenomegaly.  Pulses:  Normal pulses noted. Extremities:  Without clubbing or edema.   Impression/Plan:      78 year old gentleman with longstanding GERD now with esophageal dysphagia.  Here for EGD with esophageal dilation is feasible/appropriate.   Last Coumadin  6 days ago.  Notice: This dictation was prepared with Dragon dictation along with smaller phrase technology. Any transcriptional errors that result from this process are unintentional and may not be corrected upon review.

## 2024-04-23 NOTE — Anesthesia Preprocedure Evaluation (Signed)
 Anesthesia Evaluation  Patient identified by MRN, date of birth, ID band Patient awake    Reviewed: Allergy & Precautions, H&P , NPO status , Patient's Chart, lab work & pertinent test results, reviewed documented beta blocker date and time   Airway Mallampati: II  TM Distance: >3 FB Neck ROM: full    Dental no notable dental hx.    Pulmonary shortness of breath, COPD, Current Smoker   Pulmonary exam normal breath sounds clear to auscultation       Cardiovascular Exercise Tolerance: Good hypertension, + angina  + CAD and + Past MI   Rhythm:regular Rate:Normal     Neuro/Psych  PSYCHIATRIC DISORDERS       Neuromuscular disease    GI/Hepatic Neg liver ROS,GERD  ,,  Endo/Other  negative endocrine ROS    Renal/GU negative Renal ROS  negative genitourinary   Musculoskeletal   Abdominal   Peds  Hematology  (+) Blood dyscrasia, anemia   Anesthesia Other Findings   Reproductive/Obstetrics negative OB ROS                              Anesthesia Physical Anesthesia Plan  ASA: 3  Anesthesia Plan: General   Post-op Pain Management:    Induction:   PONV Risk Score and Plan: Propofol  infusion  Airway Management Planned:   Additional Equipment:   Intra-op Plan:   Post-operative Plan:   Informed Consent: I have reviewed the patients History and Physical, chart, labs and discussed the procedure including the risks, benefits and alternatives for the proposed anesthesia with the patient or authorized representative who has indicated his/her understanding and acceptance.     Dental Advisory Given  Plan Discussed with: CRNA  Anesthesia Plan Comments:          Anesthesia Quick Evaluation

## 2024-04-23 NOTE — Op Note (Signed)
 [facility] Patient Name: [name] Procedure Date: 04/23/2024 9:12 AM MRN: [id number] Date of Birth: 02/01/1946 Attending MD: [name] , MD, [id number] CSN: [id number] Age: 78 Admit Type: Outpatient Procedure:                Upper GI endoscopy Indications:              Dysphagia Providers:                [name], MD, Madelin Hunter, RN, Dorcas Lenis, Technician Referring MD:              Medicines:                Propofol  per Anesthesia Complications:            No immediate complications. Estimated Blood Loss:     Estimated blood loss was minimal. Procedure:                Pre-Anesthesia Assessment:                           - Prior to the procedure, a History and Physical                            was performed, and patient medications and                            allergies were reviewed. The patient's tolerance of                            previous anesthesia was also reviewed. The risks                            and benefits of the procedure and the sedation                            options and risks were discussed with the patient.                            All questions were answered, and informed consent                            was obtained. Prior Anticoagulants: The patient                            last took Coumadin  (warfarin) 6 days prior to the                            procedure. ASA Grade Assessment: III - A patient                            with severe systemic disease. After reviewing the  risks and benefits, the patient was deemed in                            satisfactory condition to undergo the procedure.                           After obtaining informed consent, the endoscope was                            passed under direct vision. Throughout the                            procedure, the patient's blood pressure, pulse, and                            oxygen  saturations were monitored continuously. The                            [id number] ([id number]) Upper was introduced through                            the mouth, and advanced to the second part of                            duodenum. The upper GI endoscopy was accomplished                            without difficulty. The patient tolerated the                            procedure well. Scope In: 9:42:26 AM Scope Out: 9:54:46 AM Total Procedure Duration: 0 hours 12 minutes 20 seconds  Findings:      Slightly ringed appearing esophagus but appeared widely patent. No tumor       seen. Scope passed without difficulty.      Gastric cavity empty. Mucosal changes suspicious for portal hypertensive       gastropathy?"moderate. No ulcer or infiltrating process seen. Pylorus       patent.      The duodenal bulb and second portion of the duodenum were normal. Scope       withdrawn and a 56 French Maloney dilator was passed to full insertion       with mild resistance. A look back revealed a tiny tear at the GE       junction. No apparent complication. Minimal blood loss.      Finally gastric biopsies were taken. Esophageal biopsies mid and distal       esophagus were taken. Impression:               - Slightly ringed appearing esophagus. Status post                            Maloney dilation and biopsy; normal duodenal bulb                            and second portion of the duodenum.                           -  Abnormal gastric mucosa of uncertain                            significance?"status post biopsy Moderate Sedation:      Moderate (conscious) sedation was personally administered by an       anesthesia professional. The following parameters were monitored: oxygen       saturation, heart rate, blood pressure, and response to care. Recommendation:           - Patient has a contact number available for                            emergencies. The signs and symptoms of potential                             delayed complications were discussed with the                            patient. Return to normal activities tomorrow.                            Written discharge instructions were provided to the                            patient.                           - Advance diet as tolerated. Follow-up on                            pathology. Continue daily PPI. Resume Coumadin                             tomorrow. Further recommendations to follow. Procedure Code(s):        --- Professional ---                           [id number], Esophagogastroduodenoscopy, flexible,                            transoral; diagnostic, including collection of                            specimen(s) by brushing or washing, when performed                            (separate procedure) Diagnosis Code(s):        --- Professional ---                           R13.10, Dysphagia, unspecified CPT copyright 2022 American Medical Association. All rights reserved. The codes documented in this report are preliminary and upon coder review may  be revised to meet current compliance requirements. [name], MD [name], MD 04/23/2024 10:04:36 AM This report has been signed electronically. Number of Addenda: 0

## 2024-04-23 NOTE — Anesthesia Postprocedure Evaluation (Signed)
 Anesthesia Post Note  Patient: [name]  Procedure(s) Performed: EGD (ESOPHAGOGASTRODUODENOSCOPY) DILATION, ESOPHAGUS  Patient location during evaluation: Short Stay Anesthesia Type: General Level of consciousness: awake and alert Pain management: pain level controlled Vital Signs Assessment: post-procedure vital signs reviewed and stable Respiratory status: spontaneous breathing Cardiovascular status: blood pressure returned to baseline and stable Postop Assessment: no apparent nausea or vomiting Anesthetic complications: no   No notable events documented.   Last Vitals:  Vitals:   04/23/24 1002 04/23/24 1007  BP: (!) 93/52 99/62  Pulse: 66 67  Resp: 20 19  Temp: 36.4 C   SpO2: 100% 100%    Last Pain:  Vitals:   04/23/24 1002  TempSrc: Oral  PainSc: Asleep                 Quaneshia Wareing

## 2024-04-24 ENCOUNTER — Encounter (HOSPITAL_COMMUNITY): Payer: Self-pay | Admitting: Internal Medicine

## 2024-04-24 ENCOUNTER — Ambulatory Visit: Admitting: Family Medicine

## 2024-04-24 VITALS — BP 106/67 | HR 65 | Temp 98.0°F | Ht 66.0 in | Wt 144.8 lb

## 2024-04-24 DIAGNOSIS — J449 Chronic obstructive pulmonary disease, unspecified: Secondary | ICD-10-CM | POA: Diagnosis not present

## 2024-04-24 DIAGNOSIS — Z8739 Personal history of other diseases of the musculoskeletal system and connective tissue: Secondary | ICD-10-CM | POA: Diagnosis not present

## 2024-04-24 DIAGNOSIS — Z23 Encounter for immunization: Secondary | ICD-10-CM

## 2024-04-24 DIAGNOSIS — E785 Hyperlipidemia, unspecified: Secondary | ICD-10-CM | POA: Diagnosis not present

## 2024-04-24 DIAGNOSIS — I700 Atherosclerosis of aorta: Secondary | ICD-10-CM

## 2024-04-24 DIAGNOSIS — K219 Gastro-esophageal reflux disease without esophagitis: Secondary | ICD-10-CM

## 2024-04-24 LAB — SURGICAL PATHOLOGY

## 2024-04-24 MED ORDER — ATORVASTATIN CALCIUM 80 MG PO TABS: 80.0000 mg | ORAL_TABLET | Freq: Every day | ORAL | 3 refills | Status: AC

## 2024-04-24 MED ORDER — METOPROLOL SUCCINATE ER 50 MG PO TB24: 75.0000 mg | ORAL_TABLET | Freq: Every day | ORAL | 1 refills | Status: DC

## 2024-04-24 MED ORDER — FLUTICASONE PROPIONATE 50 MCG/ACT NA SUSP: 2.0000 | Freq: Every day | NASAL | 2 refills | Status: AC | PRN

## 2024-04-24 MED ORDER — FUROSEMIDE 20 MG PO TABS: 20.0000 mg | ORAL_TABLET | ORAL | 1 refills | Status: DC | PRN

## 2024-04-24 MED ORDER — ALLOPURINOL 100 MG PO TABS: 100.0000 mg | ORAL_TABLET | Freq: Every day | ORAL | 3 refills | Status: AC

## 2024-04-24 MED ORDER — ALBUTEROL SULFATE HFA 108 (90 BASE) MCG/ACT IN AERS: 2.0000 | INHALATION_SPRAY | Freq: Four times a day (QID) | RESPIRATORY_TRACT | 3 refills | Status: AC | PRN

## 2024-04-24 MED ORDER — PANTOPRAZOLE SODIUM 40 MG PO TBEC: 40.0000 mg | DELAYED_RELEASE_TABLET | Freq: Every day | ORAL | 1 refills | Status: DC

## 2024-04-24 MED ORDER — WARFARIN SODIUM 5 MG PO TABS: 5.0000 mg | ORAL_TABLET | Freq: Every day | ORAL | 1 refills | Status: DC

## 2024-04-24 MED ORDER — SPIRONOLACTONE 25 MG PO TABS: 12.5000 mg | ORAL_TABLET | Freq: Two times a day (BID) | ORAL | 3 refills | Status: AC

## 2024-04-24 NOTE — Progress Notes (Signed)
 Subjective:  [name], male    DOB: Jan 16, 1946  Age: 78 y.o. MRN: [id number]  CC: Medical Management of Chronic Issues   HPI  History of Present Illness [name] is a 78 year old male with emphysema and aortic atherosclerosis who presents for follow-up on multiple chronic conditions.  He recently underwent an esophageal dilation which has improved his swallowing, as food now 'goes down better' and he has not experienced a cough since the procedure. Inflammation was noted in the esophagus, but no immediate treatment was initiated. He is currently taking pantoprazole  for esophageal inflammation and uses Tums occasionally for heartburn.  He is on metoprolol  for heart rate control, taking 75 mg daily. Initially prescribed 50 mg, he splits tablets to achieve the correct dose.  He has a history of gout, which is well-controlled with allopurinol , as he has not experienced any recent gout attacks.  He is on atorvastatin  for cholesterol management and has not reported any issues with this medication. He recently had an EKG, and Dr. Alvan told him his heart was stronger.  He experiences significant shortness of breath, particularly when walking to the mailbox, which is approximately 150 feet from his home. It takes about five minutes for him to recover his breath after this exertion. He continues to smoke and has a history of emphysema, confirmed by a recent CT scan. He uses Breztri  daily and has albuterol  as a backup inhaler.  A recent CT scan also revealed gallstones and aortic atherosclerosis, but no inflammation was noted in the gallbladder.  in for follow-up of elevated cholesterol. Doing well without complaints on current medication. Denies side effects of statin including myalgia and arthralgia and nausea. Currently no chest pain, shortness of breath or other cardiovascular related symptoms noted.  Patient in for follow-up of GERD. Currently asymptomatic taking  PPI  daily. There is no chest pain or heartburn. No hematemesis and no melena. No dysphagia or choking. Onset is remote. Progression is stable. Complicating factors, none.           04/24/2024   12:57 PM 12/05/2023   10:09 AM 06/20/2023   11:08 AM  Depression screen PHQ 2/9  Decreased Interest 0 0 2  Down, Depressed, Hopeless 0 0 0  PHQ - 2 Score 0 0 2  Altered sleeping 0 0 1  Tired, decreased energy 3 0 2  Change in appetite 0 0 1  Feeling bad or failure about yourself  0 0 0  Trouble concentrating 0 0 0  Moving slowly or fidgety/restless 0 0 0  Suicidal thoughts 0 0 0  PHQ-9 Score 3 0 6  Difficult doing work/chores Not difficult at all Not difficult at all Not difficult at all    History Kamarius has a past medical history of Anginal pain, Atrial fibrillation by electrocardiogram (HCC) (11/2019), Atrial flutter by electrocardiogram (HCC) (11/2019), Bladder cancer (HCC) (01/2020), Bladder tumor, BPH (benign prostatic hyperplasia), CAD (coronary artery disease), COPD (chronic obstructive pulmonary disease) (HCC), Dyspnea, ETOH abuse, GERD (gastroesophageal reflux disease), History of gout, Hypercholesterolemia, Hypertension, Melanoma (HCC), Myocardial infarction (HCC) (2013), Prostate cancer (HCC), and Tobacco abuse.   He has a past surgical history that includes Prostate surgery; Colonoscopy; Back surgery; Esophagogastroduodenoscopy (05/27/2012); Cardiac catheterization (2011); Coronary angioplasty; Coronary artery bypass graft; Anterior cervical decompression/discectomy fusion 4 level (N/A, 07/21/2016); Colonoscopy (N/A, 11/21/2017); polypectomy (11/21/2017); Transurethral resection of bladder tumor with mitomycin -c (Left, 01/06/2020); Cardioversion (N/A, 06/10/2020); Colonoscopy with propofol  (N/A, 06/09/2021); polypectomy (06/09/2021); RIGHT/LEFT HEART CATH  AND CORONARY/GRAFT ANGIOGRAPHY (N/A, 03/27/2022); CORONARY STENT INTERVENTION (N/A, 03/27/2022); Esophagogastroduodenoscopy (N/A,  04/23/2024); and Esophageal dilation (N/A, 04/23/2024).   His family history includes Diabetes in his brother, brother, and mother; Healthy in his son; Heart disease in his brother and brother; Hypertension in his father; Multiple sclerosis in his daughter; Skin cancer in his brother.He reports that he has been smoking cigarettes and e-cigarettes. He started smoking about 54 years ago. He has a 59.2 pack-year smoking history. He has never been exposed to tobacco smoke. He quit smokeless tobacco use about 12 years ago.  His smokeless tobacco use included snuff. He reports that he does not currently use alcohol after a past usage of about 2.0 - 4.0 standard drinks of alcohol per week. He reports that he does not use drugs.    ROS Review of Systems  Constitutional:  Negative for fever.  Respiratory:  Negative for shortness of breath.   Cardiovascular:  Negative for chest pain.  Musculoskeletal:  Negative for arthralgias.  Skin:  Negative for rash.    Objective:  BP 106/67   Pulse 65   Temp 98 F (36.7 C)   Ht 5' 6 (1.676 m)   Wt 144 lb 12.8 oz (65.7 kg)   SpO2 96%   BMI 23.37 kg/m   BP Readings from Last 3 Encounters:  04/28/24 (!) 141/68  04/24/24 106/67  04/23/24 99/62    Wt Readings from Last 3 Encounters:  04/24/24 144 lb 12.8 oz (65.7 kg)  04/18/24 147 lb 0.8 oz (66.7 kg)  03/14/24 146 lb 12.8 oz (66.6 kg)     Physical Exam Vitals reviewed.  Constitutional:      Appearance: He is well-developed.  HENT:     Head: Normocephalic and atraumatic.     Right Ear: External ear normal.     Left Ear: External ear normal.     Mouth/Throat:     Pharynx: No oropharyngeal exudate or posterior oropharyngeal erythema.  Eyes:     Pupils: Pupils are equal, round, and reactive to light.  Cardiovascular:     Rate and Rhythm: Normal rate and regular rhythm.     Heart sounds: No murmur heard. Pulmonary:     Effort: No respiratory distress.     Breath sounds: Normal breath sounds.   Musculoskeletal:     Cervical back: Normal range of motion and neck supple.  Neurological:     Mental Status: He is alert and oriented to person, place, and time.    Results for orders placed or performed in visit on 04/24/24  CBC with Differential/Platelet   Collection Time: 04/24/24  1:32 PM  Result Value Ref Range   WBC 7.5 3.4 - 10.8 x10E3/uL   RBC 4.15 4.14 - 5.80 x10E6/uL   Hemoglobin 13.4 13.0 - 17.7 g/dL   Hematocrit 59.0 62.4 - 51.0 %   MCV 99 (H) 79 - 97 fL   MCH 32.3 26.6 - 33.0 pg   MCHC 32.8 31.5 - 35.7 g/dL   RDW 85.9 88.3 - 84.5 %   Platelets 208 150 - 450 x10E3/uL   Neutrophils 53 Not Estab. %   Lymphs 37 Not Estab. %   Monocytes 7 Not Estab. %   Eos 2 Not Estab. %   Basos 1 Not Estab. %   Neutrophils Absolute 4.0 1.4 - 7.0 x10E3/uL   Lymphocytes Absolute 2.8 0.7 - 3.1 x10E3/uL   Monocytes Absolute 0.6 0.1 - 0.9 x10E3/uL   EOS (ABSOLUTE) 0.1 0.0 - 0.4 x10E3/uL  Basophils Absolute 0.0 0.0 - 0.2 x10E3/uL   Immature Granulocytes 0 Not Estab. %   Immature Grans (Abs) 0.0 0.0 - 0.1 x10E3/uL  CMP14+EGFR   Collection Time: 04/24/24  1:32 PM  Result Value Ref Range   Glucose 80 70 - 99 mg/dL   BUN 13 8 - 27 mg/dL   Creatinine, Ser 8.98 0.76 - 1.27 mg/dL   eGFR 77 >40 fO/fpw/8.26   BUN/Creatinine Ratio 13 10 - 24   Sodium 141 134 - 144 mmol/L   Potassium 4.4 3.5 - 5.2 mmol/L   Chloride 105 96 - 106 mmol/L   CO2 23 20 - 29 mmol/L   Calcium  9.7 8.6 - 10.2 mg/dL   Total Protein 6.0 6.0 - 8.5 g/dL   Albumin 3.8 3.8 - 4.8 g/dL   Globulin, Total 2.2 1.5 - 4.5 g/dL   Bilirubin Total 0.6 0.0 - 1.2 mg/dL   Alkaline Phosphatase 108 47 - 123 IU/L   AST 25 0 - 40 IU/L   ALT 18 0 - 44 IU/L  Lipid panel   Collection Time: 04/24/24  1:32 PM  Result Value Ref Range   Cholesterol, Total 108 100 - 199 mg/dL   Triglycerides 72 0 - 149 mg/dL   HDL 34 (L) >60 mg/dL   VLDL Cholesterol Cal 15 5 - 40 mg/dL   LDL Chol Calc (NIH) 59 0 - 99 mg/dL   Chol/HDL Ratio 3.2 0.0 -  5.0 ratio  Uric acid   Collection Time: 04/24/24  1:32 PM  Result Value Ref Range   Uric Acid 3.8 3.8 - 8.4 mg/dL     Assessment & Plan:  Aortic atherosclerosis -     CBC with Differential/Platelet -     CMP14+EGFR  Chronic obstructive pulmonary disease, unspecified COPD type (HCC) -     Albuterol  Sulfate HFA; Inhale 2 puffs into the lungs every 6 (six) hours as needed for wheezing or shortness of breath.  Dispense: 3 each; Refill: 3 -     Fluticasone  Propionate; Place 2 sprays into both nostrils daily as needed for allergies.  Dispense: 48 g; Refill: 2 -     CBC with Differential/Platelet -     CMP14+EGFR  H/O: gout -     Allopurinol ; Take 1 tablet (100 mg total) by mouth daily.  Dispense: 90 tablet; Refill: 3 -     CBC with Differential/Platelet -     CMP14+EGFR -     Uric acid  Hyperlipidemia, unspecified hyperlipidemia type -     Atorvastatin  Calcium ; Take 1 tablet (80 mg total) by mouth daily.  Dispense: 90 tablet; Refill: 3 -     CBC with Differential/Platelet -     CMP14+EGFR -     Lipid panel  Gastroesophageal reflux disease without esophagitis -     Pantoprazole  Sodium; Take 1 tablet (40 mg total) by mouth daily.  Dispense: 90 tablet; Refill: 1 -     CBC with Differential/Platelet -     CMP14+EGFR  Encounter for immunization -     Flu vaccine HIGH DOSE PF(Fluzone Trivalent)  Other orders -     Furosemide ; Take 1 tablet (20 mg total) by mouth as needed (swelling).  Dispense: 90 tablet; Refill: 1 -     Metoprolol  Succinate ER; Take 1.5 tablets (75 mg total) by mouth daily.  Dispense: 135 tablet; Refill: 1 -     Spironolactone ; Take 0.5 tablets (12.5 mg total) by mouth 2 (two) times daily.  Dispense: 90 tablet; Refill:  3 -     Warfarin Sodium ; Take 1 tablet (5 mg total) by mouth daily. Take 1/2 - to 1 tablet daily or as directed by coumadin  clinic. Tuesday and Saturday whole tab  Dispense: 90 tablet; Refill: 1    Assessment and Plan Assessment & Plan Chronic  obstructive pulmonary disease (COPD) with emphysema   COPD with emphysema is causing dyspnea on exertion, confirmed by a recent CT scan. He continues to smoke, worsening symptoms. Continue Breztri  inhaler, 2 puffs daily, and albuterol  as a rescue inhaler. Follow up with pulmonologist, Dr. Ruther, for further evaluation and management.  Tobacco use disorder   He continues to smoke despite known COPD and emphysema. Smoking cessation has not yet been initiated.  Gastroesophageal reflux disease (GERD) with esophageal inflammation   GERD with esophageal inflammation has improved following recent esophageal dilation. Inflammation was noted but not treated during the procedure. Continue pantoprazole  to control inflammation. Use Tums as needed for breakthrough symptoms, but if used more than 2-3 times a week, consider increasing pantoprazole  to twice daily.  Hyperlipidemia   Hyperlipidemia is managed with atorvastatin . The importance of cholesterol management to prevent cardiovascular events was discussed. Continue atorvastatin  and order blood work to assess cholesterol levels.  Aortic atherosclerosis   Aortic atherosclerosis is present, highlighting the need for cholesterol management to prevent progression. Continue atorvastatin  for cholesterol management.  Gout   Gout is well-controlled with allopurinol , with no recent attacks reported. Continue allopurinol  and order blood work to assess uric acid levels.  Gallstones without cholecystitis   Gallstones are present but asymptomatic and non-inflamed.       Follow-up: Return in about 6 months (around 10/22/2024), or if symptoms worsen or fail to improve.  Butler Der, M.D.

## 2024-04-25 LAB — CBC WITH DIFFERENTIAL/PLATELET
Basophils Absolute: 0 x10E3/uL (ref 0.0–0.2)
Basos: 1 %
EOS (ABSOLUTE): 0.1 x10E3/uL (ref 0.0–0.4)
Eos: 2 %
Hematocrit: 40.9 % (ref 37.5–51.0)
Hemoglobin: 13.4 g/dL (ref 13.0–17.7)
Immature Grans (Abs): 0 x10E3/uL (ref 0.0–0.1)
Immature Granulocytes: 0 %
Lymphocytes Absolute: 2.8 x10E3/uL (ref 0.7–3.1)
Lymphs: 37 %
MCH: 32.3 pg (ref 26.6–33.0)
MCHC: 32.8 g/dL (ref 31.5–35.7)
MCV: 99 fL — ABNORMAL HIGH (ref 79–97)
Monocytes Absolute: 0.6 x10E3/uL (ref 0.1–0.9)
Monocytes: 7 %
Neutrophils Absolute: 4 x10E3/uL (ref 1.4–7.0)
Neutrophils: 53 %
Platelets: 208 x10E3/uL (ref 150–450)
RBC: 4.15 x10E6/uL (ref 4.14–5.80)
RDW: 14 % (ref 11.6–15.4)
WBC: 7.5 x10E3/uL (ref 3.4–10.8)

## 2024-04-25 LAB — CMP14+EGFR
ALT: 18 IU/L (ref 0–44)
AST: 25 IU/L (ref 0–40)
Albumin: 3.8 g/dL (ref 3.8–4.8)
Alkaline Phosphatase: 108 IU/L (ref 47–123)
BUN/Creatinine Ratio: 13 (ref 10–24)
BUN: 13 mg/dL (ref 8–27)
Bilirubin Total: 0.6 mg/dL (ref 0.0–1.2)
CO2: 23 mmol/L (ref 20–29)
Calcium: 9.7 mg/dL (ref 8.6–10.2)
Chloride: 105 mmol/L (ref 96–106)
Creatinine, Ser: 1.01 mg/dL (ref 0.76–1.27)
Globulin, Total: 2.2 g/dL (ref 1.5–4.5)
Glucose: 80 mg/dL (ref 70–99)
Potassium: 4.4 mmol/L (ref 3.5–5.2)
Sodium: 141 mmol/L (ref 134–144)
Total Protein: 6 g/dL (ref 6.0–8.5)
eGFR: 77 mL/min/1.73 (ref >59)

## 2024-04-25 LAB — URIC ACID: Uric Acid: 3.8 mg/dL (ref 3.8–8.4)

## 2024-04-25 LAB — LIPID PANEL
Chol/HDL Ratio: 3.2 ratio (ref 0.0–5.0)
Cholesterol, Total: 108 mg/dL (ref 100–199)
HDL: 34 mg/dL — ABNORMAL LOW (ref >39)
LDL Chol Calc (NIH): 59 mg/dL (ref 0–99)
Triglycerides: 72 mg/dL (ref 0–149)
VLDL Cholesterol Cal: 15 mg/dL (ref 5–40)

## 2024-04-27 ENCOUNTER — Ambulatory Visit: Payer: Self-pay | Admitting: Internal Medicine

## 2024-04-28 ENCOUNTER — Encounter: Payer: Self-pay | Admitting: Dermatology

## 2024-04-28 ENCOUNTER — Ambulatory Visit (INDEPENDENT_AMBULATORY_CARE_PROVIDER_SITE_OTHER): Admitting: Dermatology

## 2024-04-28 ENCOUNTER — Other Ambulatory Visit: Payer: Self-pay | Admitting: Acute Care

## 2024-04-28 VITALS — BP 141/68 | HR 76

## 2024-04-28 DIAGNOSIS — Z87891 Personal history of nicotine dependence: Secondary | ICD-10-CM

## 2024-04-28 DIAGNOSIS — I4819 Other persistent atrial fibrillation: Secondary | ICD-10-CM

## 2024-04-28 DIAGNOSIS — C439 Malignant melanoma of skin, unspecified: Secondary | ICD-10-CM

## 2024-04-28 DIAGNOSIS — L905 Scar conditions and fibrosis of skin: Secondary | ICD-10-CM | POA: Diagnosis not present

## 2024-04-28 DIAGNOSIS — L281 Prurigo nodularis: Secondary | ICD-10-CM | POA: Diagnosis not present

## 2024-04-28 DIAGNOSIS — Z122 Encounter for screening for malignant neoplasm of respiratory organs: Secondary | ICD-10-CM

## 2024-04-28 DIAGNOSIS — L720 Epidermal cyst: Secondary | ICD-10-CM

## 2024-04-28 DIAGNOSIS — F17210 Nicotine dependence, cigarettes, uncomplicated: Secondary | ICD-10-CM

## 2024-04-28 DIAGNOSIS — Z85820 Personal history of malignant melanoma of skin: Secondary | ICD-10-CM

## 2024-04-28 NOTE — Progress Notes (Signed)
   Follow Up Visit   Subjective  [name] is a 78 y.o. male who presents for the following: follow up from Mohs surgery   The patient presents for follow up from Mohs surgery for a MM on the right anterior neck, treated on 03/17/24, repaired with linear closure . The patient has been bandaging the wound as directed. The endorse the following concerns: No questions or concerns at this time.  The following portions of the chart were reviewed this encounter and updated as appropriate: medications, allergies, medical history  Review of Systems:  No other skin or systemic complaints except as noted in HPI or Assessment and Plan.  Objective  Well appearing patient in no apparent distress; mood and affect are within normal limits.  A focal examination was performed including scalp, head, face and neck. All findings within normal limits unless otherwise noted below.  Healing wound with mild erythema  Relevant physical exam findings are noted in the Assessment and Plan.    Assessment & Plan   Healing Wound s/p Mohs for MM on the right neck, treated on 03/17/2024, repaired with linear repair - Reassured that wound is healing well - No evidence of infection - No swelling, induration, purulence, dehiscence, or tenderness out of proportion to the clinical exam, see photo above - Discussed that scars take up to 12 months to mature from the date of surgery - Recommend SPF 30+ to scar daily to prevent purple color from UV exposure during scar maturation process - Discussed that erythema and raised appearance of scar will fade over the next 4-6 months - OK to start scar massage at 4-6 weeks post-op - Can consider silicone based products for scar healing starting at 6 weeks post-op - Ok to discontinue ointment daily to wound.  HISTORY OF MELANOMA - No evidence of recurrence today - No lymphadenopathy - Recommend regular full body skin exams - Recommend daily broad spectrum sunscreen SPF 30+  to sun-exposed areas, reapply every 2 hours as needed.  - Call if any new or changing lesions are noted between office visits   EPIDERMAL INCLUSION CYST Exam: Subcutaneous nodule at right neck  Benign-appearing. Exam most consistent with an epidermal inclusion cyst. Discussed that a cyst is a benign growth that can grow over time and sometimes get irritated or inflamed. Patient would like it excised.   Prurigo Nodularis- Left lower leg - Reassured - Will monitor for changes, recommend against picking   Return for for cyst on neck and separate app for [facility] with whomever is availible soonest .  I, Berwyn Lesches, Surg Tech III, am acting as scribe for RUFUS CHRISTELLA HOLY, MD.   Documentation: I have reviewed the above documentation for accuracy and completeness, and I agree with the above.  RUFUS CHRISTELLA HOLY, MD

## 2024-04-28 NOTE — Patient Instructions (Signed)
       Post-Operative Scar Care: Education and Recommendations  Following your procedure, it's important to care for your scar to promote optimal healing and minimize its appearance. Proper post-operative care can help ensure that the scar heals well, and with time, it may become less noticeable. Below are key recommendations for scar care, including scar massage and the use of silicone scar gels or sheets.  1. General Scar Care Tips: -  Keep the wound clean and dry: Follow your healthcare provider's instructions for wound care, including cleaning the site and changing dressings as needed. -  Avoid sun exposure: Direct sunlight can darken scars and make them more noticeable. Once your wound has healed, apply sunscreen (SPF 30 or higher) to protect the scar from UV rays.  2. Scar Massage: - Start after healing: Wait until the scar has fully healed, with no scabs or open areas (usually 4-6 weeks after surgery). Your healthcare provider will give you specific guidance on when to begin. - Technique: Gently massage the scar in a circular motion for 5-10 minutes, 2-3 times per day. This helps to soften the tissue, reduce swelling, and improve the overall appearance of the scar. - Pressure: Apply gentle, firm pressure during the massage to break down the dense tissue that may form during healing. This helps to prevent the formation of keloids or hypertrophic scars. - Use lotion or ointment: Consider using a mild, fragrance-free lotion or vitamin E ointment to help lubricate the area during massage.  3. Silicone Scar Gels or Sheets: - When to start: Once your wound has healed completely, typically around 4-6 weeks, you can begin using silicone-based scar gels or sheets. These have been shown to improve scar appearance by hydrating the tissue and reducing inflammation. - How to use silicone gels: Apply a thin layer of the gel to the scar and allow it to dry before covering with clothing. You can use the  gel multiple times a day, depending on your provider's recommendation. - How to use silicone sheets: Cut the sheet to fit the size of your scar, and apply it directly to the healed scar. Wear it for 12-24 hours a day, and replace the sheet every few days as directed. - Benefits: Silicone helps reduce redness, flatten the scar, and improve its texture. Continued use over several months can lead to significant improvement in the appearance of the scar.  4. What to Expect: - Healing process: Scars generally take time to mature. The first few months may show redness or swelling, but this usually improves as healing progresses. - Long-term care: Scarring is a natural part of the healing process. While you cannot completely eliminate a scar, proper care can significantly improve its appearance over time. - Patience: It can take up to a year for a scar to fully mature, so it's important to be consistent with scar care and follow-up appointments with your provider.  5. When to Contact Your Healthcare Provider: - If you notice signs of infection (increased redness, warmth, drainage, or pain). - If your scar becomes unusually raised, itchy, or changes in color significantly. - If you have concerns about the appearance of your scar or experience unusual symptoms. - By following these guidelines, you can support your body's natural healing process and help ensure the best possible outcome for your scar. If you have any questions or concerns, please don't hesitate to contact our office.

## 2024-04-30 ENCOUNTER — Telehealth: Payer: Self-pay

## 2024-04-30 NOTE — Telephone Encounter (Signed)
 Received a refill request fax from [place] patient [place] company for patients BREZTRI  medication.   Please send a 90 day supply (with refills) to [facility] if applicable. Thanks!

## 2024-05-01 ENCOUNTER — Other Ambulatory Visit: Payer: Self-pay | Admitting: Family Medicine

## 2024-05-02 ENCOUNTER — Telehealth: Payer: Self-pay | Admitting: Pharmacist

## 2024-05-02 ENCOUNTER — Encounter: Payer: Self-pay | Admitting: Family Medicine

## 2024-05-02 ENCOUNTER — Ambulatory Visit: Payer: Self-pay | Admitting: Family Medicine

## 2024-05-02 MED ORDER — BREZTRI AEROSPHERE 160-9-4.8 MCG/ACT IN AERO: 2.0000 | INHALATION_SPRAY | Freq: Two times a day (BID) | RESPIRATORY_TRACT | 5 refills | Status: AC

## 2024-05-02 NOTE — Telephone Encounter (Signed)
    Patient [place] for Breztri  for 2025 Refills needed for continued therapy Medication will ship to patient's home RX escribed to [facility] with 5 refills Patient stable on current regimen   Mliss Tarry Griffin, PharmD, BCACP, CPP Clinical Pharmacist, [facility]

## 2024-05-02 NOTE — Progress Notes (Signed)
Hello Henson,  Your lab result is normal and/or stable.Some minor variations that are not significant are commonly marked abnormal, but do not represent any medical problem for you.  Best regards, Mechele Claude, M.D.

## 2024-05-05 ENCOUNTER — Ambulatory Visit: Payer: Self-pay | Attending: Cardiology | Admitting: *Deleted

## 2024-05-05 DIAGNOSIS — I4891 Unspecified atrial fibrillation: Secondary | ICD-10-CM | POA: Diagnosis not present

## 2024-05-05 DIAGNOSIS — Z5181 Encounter for therapeutic drug level monitoring: Secondary | ICD-10-CM

## 2024-05-05 LAB — POCT INR: INR: 3.5 — AB (ref 2.0–3.0)

## 2024-05-05 NOTE — Progress Notes (Signed)
 INR 3.5; Please see anticoagulation encounter

## 2024-05-05 NOTE — Patient Instructions (Signed)
 Hold warfarin tonight then resume 1 tablet daily except 1/2 tablet on Mondays and Thursdays Recheck in 3 weeks.  Call with any new medications.  Eat extra greens tonight

## 2024-05-12 ENCOUNTER — Other Ambulatory Visit: Payer: Self-pay

## 2024-05-12 ENCOUNTER — Telehealth: Payer: Self-pay | Admitting: Family Medicine

## 2024-05-12 ENCOUNTER — Telehealth: Payer: Self-pay

## 2024-05-12 MED ORDER — WARFARIN SODIUM 5 MG PO TABS: 5.0000 mg | ORAL_TABLET | Freq: Every day | ORAL | 1 refills | Status: DC

## 2024-05-12 MED ORDER — FUROSEMIDE 20 MG PO TABS: 20.0000 mg | ORAL_TABLET | Freq: Every day | ORAL | 1 refills | Status: DC | PRN

## 2024-05-12 NOTE — Telephone Encounter (Signed)
 Copied from CRM #[id number]. Topic: Clinical - Prescription Issue >> May 12, 2024 11:31 AM Delon T wrote: Reason for CRM: warfarin (COUMADIN ) 5 MG tablet- never received refill- supposed to be sent by [facility], has 2 week supply left

## 2024-05-13 ENCOUNTER — Other Ambulatory Visit: Payer: Self-pay | Admitting: Family Medicine

## 2024-05-13 MED ORDER — WARFARIN SODIUM 5 MG PO TABS: 5.0000 mg | ORAL_TABLET | Freq: Every day | ORAL | 1 refills | Status: DC

## 2024-05-13 NOTE — Telephone Encounter (Signed)
 Please let the patient know that I sent their prescription to their pharmacy. Thanks, WS

## 2024-05-13 NOTE — Telephone Encounter (Signed)
 Left detailed message making patients spouse, Dagoberto, aware that Dr Zollie sent Rx to pharmacy.

## 2024-05-15 ENCOUNTER — Encounter: Payer: Self-pay | Admitting: Dermatology

## 2024-05-15 ENCOUNTER — Ambulatory Visit (INDEPENDENT_AMBULATORY_CARE_PROVIDER_SITE_OTHER): Admitting: Dermatology

## 2024-05-15 VITALS — BP 124/79 | HR 82

## 2024-05-15 DIAGNOSIS — L720 Epidermal cyst: Secondary | ICD-10-CM | POA: Diagnosis not present

## 2024-05-15 DIAGNOSIS — Z48817 Encounter for surgical aftercare following surgery on the skin and subcutaneous tissue: Secondary | ICD-10-CM

## 2024-05-15 DIAGNOSIS — L905 Scar conditions and fibrosis of skin: Secondary | ICD-10-CM

## 2024-05-15 DIAGNOSIS — L281 Prurigo nodularis: Secondary | ICD-10-CM | POA: Diagnosis not present

## 2024-05-15 DIAGNOSIS — Z85820 Personal history of malignant melanoma of skin: Secondary | ICD-10-CM | POA: Diagnosis not present

## 2024-05-15 DIAGNOSIS — T8131XA Disruption of external operation (surgical) wound, not elsewhere classified, initial encounter: Secondary | ICD-10-CM

## 2024-05-15 NOTE — Patient Instructions (Signed)

## 2024-05-15 NOTE — Progress Notes (Unsigned)
   Follow Up Visit   Subjective  [name] is a 78 y.o. male who presents for the following: follow up from Mohs surgery   The patient presents for follow up from Mohs surgery for a MM on the right anterior neck, treated on 03/17/24, repaired with linear closure . The patient has been bandaging the wound as directed. The endorse the following concerns: Bump appeared on the suture line 2 weeks ago, it drained after applying warm compresses 1 week.   The following portions of the chart were reviewed this encounter and updated as appropriate: medications, allergies, medical history  Review of Systems:  No other skin or systemic complaints except as noted in HPI or Assessment and Plan.  Objective  Well appearing patient in no apparent distress; mood and affect are within normal limits.  A focal examination was performed including scalp, head, face and neck. All findings within normal limits unless otherwise noted below.  Healing wound with mild erythema  Relevant physical exam findings are noted in the Assessment and Plan.    Assessment & Plan   Healing Wound s/p Mohs for MM on the right neck, treated on 03/17/2024, repaired with linear repair - Reassured that wound is healing well - No evidence of infection - No swelling, induration, purulence, dehiscence, or tenderness out of proportion to the clinical exam, see photo above - Ok to discontinue ointment daily to wound. - reassured that spitting suture appears to be resolving.   HISTORY OF MELANOMA - No evidence of recurrence today - No lymphadenopathy - Recommend regular full body skin exams - Recommend daily broad spectrum sunscreen SPF 30+ to sun-exposed areas, reapply every 2 hours as needed.  - Call if any new or changing lesions are noted between office visits   EPIDERMAL INCLUSION CYST Exam: Subcutaneous nodule at right neck  Benign-appearing. Exam most consistent with an epidermal inclusion cyst. Discussed that a cyst  is a benign growth that can grow over time and sometimes get irritated or inflamed. Excision scheduled for 07/15/2024.   Prurigo Nodularis- Left lower leg- Improved.  - Reassured - Will monitor for changes, recommend against picking  Return for FBSE with Erminio and Excision with Dr. Cordarious Zeek.   Documentation: I have reviewed the above documentation for accuracy and completeness, and I agree with the above.  RUFUS CHRISTELLA HOLY, MD

## 2024-05-20 ENCOUNTER — Other Ambulatory Visit: Payer: Self-pay | Admitting: Family Medicine

## 2024-05-20 MED ORDER — WARFARIN SODIUM 5 MG PO TABS: 5.0000 mg | ORAL_TABLET | Freq: Every day | ORAL | 1 refills | Status: DC

## 2024-05-21 ENCOUNTER — Encounter: Payer: Self-pay | Admitting: Physician Assistant

## 2024-05-21 ENCOUNTER — Ambulatory Visit: Admitting: Physician Assistant

## 2024-05-21 VITALS — BP 121/88

## 2024-05-21 DIAGNOSIS — D0359 Melanoma in situ of other part of trunk: Secondary | ICD-10-CM | POA: Diagnosis not present

## 2024-05-21 DIAGNOSIS — L814 Other melanin hyperpigmentation: Secondary | ICD-10-CM

## 2024-05-21 DIAGNOSIS — D039 Melanoma in situ, unspecified: Secondary | ICD-10-CM

## 2024-05-21 DIAGNOSIS — Z1283 Encounter for screening for malignant neoplasm of skin: Secondary | ICD-10-CM | POA: Diagnosis not present

## 2024-05-21 DIAGNOSIS — L578 Other skin changes due to chronic exposure to nonionizing radiation: Secondary | ICD-10-CM

## 2024-05-21 DIAGNOSIS — C44729 Squamous cell carcinoma of skin of left lower limb, including hip: Secondary | ICD-10-CM | POA: Diagnosis not present

## 2024-05-21 DIAGNOSIS — C44629 Squamous cell carcinoma of skin of left upper limb, including shoulder: Secondary | ICD-10-CM | POA: Diagnosis not present

## 2024-05-21 DIAGNOSIS — C44622 Squamous cell carcinoma of skin of right upper limb, including shoulder: Secondary | ICD-10-CM

## 2024-05-21 DIAGNOSIS — W908XXA Exposure to other nonionizing radiation, initial encounter: Secondary | ICD-10-CM | POA: Diagnosis not present

## 2024-05-21 DIAGNOSIS — L570 Actinic keratosis: Secondary | ICD-10-CM

## 2024-05-21 DIAGNOSIS — Z85820 Personal history of malignant melanoma of skin: Secondary | ICD-10-CM

## 2024-05-21 DIAGNOSIS — D229 Melanocytic nevi, unspecified: Secondary | ICD-10-CM

## 2024-05-21 DIAGNOSIS — D485 Neoplasm of uncertain behavior of skin: Secondary | ICD-10-CM

## 2024-05-21 DIAGNOSIS — D1801 Hemangioma of skin and subcutaneous tissue: Secondary | ICD-10-CM

## 2024-05-21 DIAGNOSIS — L821 Other seborrheic keratosis: Secondary | ICD-10-CM

## 2024-05-21 HISTORY — DX: Melanoma in situ, unspecified: D03.9

## 2024-05-21 NOTE — Progress Notes (Signed)
 Follow-Up Visit   Subjective  [name] is a 78 y.o. male ESTABLISHED PATIENT who presents for the following: Skin Cancer Screening and Full Body Skin Exam - History of Melanoma of right neck 03/17/2024  The patient presents for Total-Body Skin Exam (TBSE) for skin cancer screening and mole check. The patient has spots, moles and lesions to be evaluated, some may be new or changing and the patient may have concern these could be cancer.  Patient has lesions on both forearms that he is concerned about --- got his forearms caught in the steering wheel of his tractor and tore the skin and they healed like they are seen today.     The following portions of the chart were reviewed this encounter and updated as appropriate: medications, allergies, medical history  Review of Systems:  No other skin or systemic complaints except as noted in HPI or Assessment and Plan.  Objective  Well appearing patient in no apparent distress; mood and affect are within normal limits.  A full examination was performed including scalp, head, eyes, ears, nose, lips, neck, chest, axillae, abdomen, back, buttocks, bilateral upper extremities, bilateral lower extremities, hands, feet, fingers, toes, fingernails, and toenails. All findings within normal limits unless otherwise noted below.   NO PALPABLE CERVICAL, AXILLARY OR INGUINAL LYMPHADENOPATHY.   Relevant physical exam findings are noted in the Assessment and Plan.  Scalp, face (10) Erythematous thin papules/macules with gritty scale.  Right mid forearm 3.0 cm hyperkeratotic plaque  Left proxiaml forearm 1.9 cm hyperkeratotic plaque  Left lat lower leg 1.3 cm hyperkeratotic papule  Right Lower Back 1.1 cm irregular brown patch   Assessment & Plan   SKIN CANCER SCREENING PERFORMED TODAY.  ACTINIC DAMAGE - Chronic condition, secondary to cumulative UV/sun exposure - diffuse scaly erythematous macules with underlying dyspigmentation -  Recommend daily broad spectrum sunscreen SPF 30+ to sun-exposed areas, reapply every 2 hours as needed.  - Staying in the shade or wearing long sleeves, sun glasses (UVA+UVB protection) and wide brim hats (4-inch brim around the entire circumference of the hat) are also recommended for sun protection.  - Call for new or changing lesions.  LENTIGINES, SEBORRHEIC KERATOSES, HEMANGIOMAS - Benign normal skin lesions - Benign-appearing - Call for any changes  MELANOCYTIC NEVI - Tan-brown and/or pink-flesh-colored symmetric macules and papules - Benign appearing on exam today - Observation - Call clinic for new or changing moles - Recommend daily use of broad spectrum spf 30+ sunscreen to sun-exposed areas.   HISTORY OF MELANOMA OF RIGHT NECK - No evidence of recurrence today - No lymphadenopathy - Recommend regular full body skin exams - Recommend daily broad spectrum sunscreen SPF 30+ to sun-exposed areas, reapply every 2 hours as needed.  - Call if any new or changing lesions are noted between office visits     AK (ACTINIC KERATOSIS) (10) Scalp, face (10) Destruction of lesion - Scalp, face (10) Complexity: simple   Destruction method: cryotherapy   Informed consent: discussed and consent obtained   Timeout:  patient name, date of birth, surgical site, and procedure verified Lesion destroyed using liquid nitrogen: Yes   Region frozen until ice ball extended beyond lesion: Yes   Outcome: patient tolerated procedure well with no complications   Post-procedure details: wound care instructions given    NEOPLASM OF UNCERTAIN BEHAVIOR OF SKIN (4) Right mid forearm Skin / nail biopsy Type of biopsy: tangential   Informed consent: discussed and consent obtained   Timeout: patient name,  date of birth, surgical site, and procedure verified   Procedure prep:  Patient was prepped and draped in usual sterile fashion Prep type:  Isopropyl alcohol Anesthesia: the lesion was anesthetized in  a standard fashion   Anesthetic:  1% lidocaine  w/ epinephrine  1-100,000 buffered w/ 8.4% NaHCO3 Instrument used: flexible razor blade   Hemostasis achieved with: pressure, aluminum chloride and electrodesiccation   Outcome: patient tolerated procedure well   Post-procedure details: sterile dressing applied and wound care instructions given   Dressing type: bandage and petrolatum    Specimen 1 - Surgical pathology Differential Diagnosis: SCC vs other  Check Margins: No Left proxiaml forearm Skin / nail biopsy Type of biopsy: tangential   Informed consent: discussed and consent obtained   Timeout: patient name, date of birth, surgical site, and procedure verified   Procedure prep:  Patient was prepped and draped in usual sterile fashion Prep type:  Isopropyl alcohol Anesthesia: the lesion was anesthetized in a standard fashion   Anesthetic:  1% lidocaine  w/ epinephrine  1-100,000 buffered w/ 8.4% NaHCO3 Instrument used: flexible razor blade   Hemostasis achieved with: pressure, aluminum chloride and electrodesiccation   Outcome: patient tolerated procedure well   Post-procedure details: sterile dressing applied and wound care instructions given   Dressing type: bandage and petrolatum    Specimen 2 - Surgical pathology Differential Diagnosis: SCC vs other   Check Margins: No Left lat lower leg Skin / nail biopsy Type of biopsy: tangential   Informed consent: discussed and consent obtained   Timeout: patient name, date of birth, surgical site, and procedure verified   Procedure prep:  Patient was prepped and draped in usual sterile fashion Prep type:  Isopropyl alcohol Anesthesia: the lesion was anesthetized in a standard fashion   Anesthetic:  1% lidocaine  w/ epinephrine  1-100,000 buffered w/ 8.4% NaHCO3 Instrument used: flexible razor blade   Hemostasis achieved with: pressure, aluminum chloride and electrodesiccation   Outcome: patient tolerated procedure well    Post-procedure details: sterile dressing applied and wound care instructions given   Dressing type: bandage and petrolatum    Specimen 3 - Surgical pathology Differential Diagnosis: SCC vs other   Check Margins: No Right Lower Back Epidermal / dermal shaving  Lesion diameter (cm):  1.1 Informed consent: discussed and consent obtained   Timeout: patient name, date of birth, surgical site, and procedure verified   Procedure prep:  Patient was prepped and draped in usual sterile fashion Prep type:  Isopropyl alcohol Anesthesia: the lesion was anesthetized in a standard fashion   Anesthetic:  1% lidocaine  w/ epinephrine  1-100,000 buffered w/ 8.4% NaHCO3 Instrument used: flexible razor blade   Hemostasis achieved with: pressure, aluminum chloride and electrodesiccation   Outcome: patient tolerated procedure well   Post-procedure details: sterile dressing applied and wound care instructions given   Dressing type: bandage and petrolatum    Specimen 4 - Surgical pathology Differential Diagnosis: Dysplastic nevus vs Melanoma  Check Margins: No SCREENING EXAM FOR SKIN CANCER   PERSONAL HISTORY OF MALIGNANT MELANOMA OF SKIN   ACTINIC SKIN DAMAGE   LENTIGINES   SEBORRHEIC KERATOSIS   CHERRY ANGIOMA   MULTIPLE BENIGN NEVI   Return for Follow up as scheduled surgery with Dr Corey, 3 month TBSE with Erminio.  I, Roseline Hutchinson, CMA, am acting as scribe for Shloka Baldridge K, PA-C .   Documentation: I have reviewed the above documentation for accuracy and completeness, and I agree with the above.  Shadow Stiggers K, PA-C

## 2024-05-21 NOTE — Patient Instructions (Addendum)

## 2024-05-23 ENCOUNTER — Ambulatory Visit: Payer: Self-pay | Admitting: Physician Assistant

## 2024-05-23 LAB — SURGICAL PATHOLOGY

## 2024-05-26 ENCOUNTER — Ambulatory Visit: Attending: Cardiology | Admitting: *Deleted

## 2024-05-26 DIAGNOSIS — I4891 Unspecified atrial fibrillation: Secondary | ICD-10-CM | POA: Diagnosis not present

## 2024-05-26 DIAGNOSIS — Z5181 Encounter for therapeutic drug level monitoring: Secondary | ICD-10-CM | POA: Diagnosis not present

## 2024-05-26 LAB — POCT INR: INR: 4.4 — AB (ref 2.0–3.0)

## 2024-05-26 NOTE — Progress Notes (Signed)
 INR 4.4 Please see anticoagulation encounter

## 2024-05-26 NOTE — Patient Instructions (Signed)
 Hold warfarin tonight then decrease dose to 1 tablet daily except 1/2 tablet on Mondays, Wednesdays and Fridays Recheck in 3 weeks.  Call with any new medications.  Eat extra greens tonight

## 2024-06-04 ENCOUNTER — Other Ambulatory Visit: Payer: Self-pay | Admitting: Family Medicine

## 2024-06-04 DIAGNOSIS — K219 Gastro-esophageal reflux disease without esophagitis: Secondary | ICD-10-CM

## 2024-06-09 ENCOUNTER — Telehealth: Payer: Self-pay | Admitting: Pharmacy Technician

## 2024-06-09 ENCOUNTER — Ambulatory Visit: Attending: Cardiology | Admitting: *Deleted

## 2024-06-09 DIAGNOSIS — I4891 Unspecified atrial fibrillation: Secondary | ICD-10-CM

## 2024-06-09 DIAGNOSIS — Z5181 Encounter for therapeutic drug level monitoring: Secondary | ICD-10-CM | POA: Diagnosis not present

## 2024-06-09 LAB — POCT INR: INR: 6.1 — AB (ref 2.0–3.0)

## 2024-06-09 NOTE — Patient Instructions (Addendum)
 Hold warfarin x 3 days then decrease dose to 1/2 tablet daily except 1 tablet on Tuesdays and Fridays. Recheck in 1 week.  Call with any new medications.  Eat extra greens tonight Pt denies s/s of bleeding or excessive bruising.  Bleeding and fall precautions discussed with pt and he verbalized understanding.

## 2024-06-09 NOTE — Progress Notes (Signed)
 INR 6.1; Please see anticoagulation encounter

## 2024-06-11 ENCOUNTER — Telehealth: Payer: Self-pay

## 2024-06-11 NOTE — Telephone Encounter (Signed)
 Patient is scheduled for Immuno Mohs tomorrow. He had an INR of 6.1 on 11/3. Per Dr. Corey, we will need to reschedule the surgery until his INR is stabilized. I spoke to the patient and his wife who understand and will be waiting for a call from the schedulers to reschedule.

## 2024-06-12 ENCOUNTER — Encounter: Admitting: Dermatology

## 2024-06-18 ENCOUNTER — Ambulatory Visit: Attending: Cardiology | Admitting: *Deleted

## 2024-06-18 DIAGNOSIS — Z5181 Encounter for therapeutic drug level monitoring: Secondary | ICD-10-CM

## 2024-06-18 DIAGNOSIS — I4891 Unspecified atrial fibrillation: Secondary | ICD-10-CM | POA: Diagnosis not present

## 2024-06-18 DIAGNOSIS — I4819 Other persistent atrial fibrillation: Secondary | ICD-10-CM | POA: Diagnosis not present

## 2024-06-18 LAB — POCT INR: INR: 1.5 — AB (ref 2.0–3.0)

## 2024-06-18 NOTE — Patient Instructions (Signed)
 Take warfarin 1 tablet tonight then increase dose to 1/2 tablets daily except 1 tablet on Tuesdays, Thursdays and Saturdays Recheck in 2 week.  Call with any new medications.

## 2024-06-18 NOTE — Progress Notes (Signed)
 INR-1.5; Please see anticoagulation encounter

## 2024-06-30 ENCOUNTER — Ambulatory Visit: Attending: Cardiology | Admitting: *Deleted

## 2024-06-30 DIAGNOSIS — Z5181 Encounter for therapeutic drug level monitoring: Secondary | ICD-10-CM

## 2024-06-30 DIAGNOSIS — I4891 Unspecified atrial fibrillation: Secondary | ICD-10-CM

## 2024-06-30 LAB — POCT INR: INR: 2.5 (ref 2.0–3.0)

## 2024-06-30 NOTE — Progress Notes (Signed)
 INR 2.5. Please see anticoagulation encounter

## 2024-06-30 NOTE — Patient Instructions (Signed)
 Continue warfarin 1/2 tablets daily except 1 tablet on Tuesdays, Thursdays and Saturdays Recheck in 3 weeks.  Call with any new medications.

## 2024-06-30 NOTE — Telephone Encounter (Signed)
 Pts wife dropped off application and asked us  to fax them! She expressed they never sent Almarie Moores part of the application to fill out.  Faxed paper work for patient.

## 2024-07-01 ENCOUNTER — Encounter: Payer: Self-pay | Admitting: Dermatology

## 2024-07-01 ENCOUNTER — Ambulatory Visit (INDEPENDENT_AMBULATORY_CARE_PROVIDER_SITE_OTHER): Admitting: Dermatology

## 2024-07-01 VITALS — BP 100/65 | HR 70 | Temp 97.9°F

## 2024-07-01 DIAGNOSIS — L814 Other melanin hyperpigmentation: Secondary | ICD-10-CM

## 2024-07-01 DIAGNOSIS — L578 Other skin changes due to chronic exposure to nonionizing radiation: Secondary | ICD-10-CM | POA: Diagnosis not present

## 2024-07-01 DIAGNOSIS — D0359 Melanoma in situ of other part of trunk: Secondary | ICD-10-CM | POA: Diagnosis not present

## 2024-07-01 DIAGNOSIS — C439 Malignant melanoma of skin, unspecified: Secondary | ICD-10-CM

## 2024-07-01 NOTE — Patient Instructions (Signed)

## 2024-07-01 NOTE — Progress Notes (Signed)
 Follow-Up Visit   Subjective  [name] is a 78 y.o. male who presents for the following: Mohs of Melanoma in Situ on the right lower back, referred by Erminio Like, PA-C.  The following portions of the chart were reviewed this encounter and updated as appropriate: medications, allergies, medical history  Review of Systems:  No other skin or systemic complaints except as noted in HPI or Assessment and Plan.  Objective  Well appearing patient in no apparent distress; mood and affect are within normal limits.  A focused examination was performed of the following areas: Right lower back Relevant physical exam findings are noted in the Assessment and Plan.   Right Lower Back Biopsy scar   Assessment & Plan   MALIGNANT MELANOMA OF SKIN (HCC) Right Lower Back Mohs surgery  Consent obtained: written  Anticoagulation: Was the anticoagulation regimen changed prior to Mohs? No    Anesthesia: Anesthesia method: local infiltration Local anesthetic: lidocaine  1% WITH epi  Procedure Details: Timeout: pre-procedure verification complete Procedure Prep: patient was prepped and draped in usual sterile fashion Prep type: chlorhexidine  Biopsy accession number: [id number] Pre-Op diagnosis: melanoma Melanoma subtype: in situ MohsAIQ Surgical site (if tumor spans multiple areas, please select predominant area): trunk (excluding nipple/areola) Surgery side: right Surgical site (from skin exam): Right Lower Back Pre-operative length (cm): 1.4 Pre-operative width (cm): 1.4 Indications for Mohs surgery: anatomic location where tissue conservation is critical and aggressive histology  Micrographic Surgery Details: Post-operative length (cm): 3 Post-operative width (cm): 2.5 Number of Mohs stages: 1 Post surgery depth of defect: dermis and subcutaneous fat  Stage 1    Tumor features identified on Mohs section: no tumor identified    Depth of tumor invasion after stage:  dermis  Reconstruction: Was the defect reconstructed? Yes   Was reconstruction performed by the same Mohs surgeon? Yes   Setting of reconstruction: outpatient office When was reconstruction performed? same day Type of reconstruction: linear Linear reconstruction: complex  Skin repair Complexity:  Complex Final length (cm):  7.1 Informed consent: discussed and consent obtained   Timeout: patient name, date of birth, surgical site, and procedure verified   Procedure prep:  Patient was prepped and draped in usual sterile fashion Prep type:  Chlorhexidine  Anesthesia: the lesion was anesthetized in a standard fashion   Anesthetic:  1% lidocaine  w/ epinephrine  1-100,000 buffered w/ 8.4% NaHCO3 Reason for type of repair: reduce tension to allow closure, allow closure of the large defect, preserve normal anatomy and enhance both functionality and cosmetic results   Undermining: area extensively undermined   Subcutaneous layers (deep stitches):  Suture size:  3-0 Suture type: PDS (polydioxanone)   Stitches:  Buried vertical mattress Fine/surface layer approximation (top stitches):  Suture type: cyanoacrylate tissue glue   Hemostasis achieved with: suture, pressure and electrodesiccation Outcome: patient tolerated procedure well with no complications   Post-procedure details: sterile dressing applied and wound care instructions given   Dressing type: bandage and pressure dressing      Return in about 1 month (around 07/31/2024) for wound check.  LILLETTE Darice Smock, CMA, am acting as scribe for RUFUS CHRISTELLA HOLY, MD.    07/01/2024  HISTORY OF PRESENT ILLNESS  [name] is seen in consultation at the request of Erminio Like, PA-C for biopsy-proven Melanoma in Situ of the right lower back. They note that the area has been present for about 1 year increasing in size with time.  There is no history of previous treatment.  Reports  no other new or changing lesions and has no other  complaints today.  Medications and allergies: see patient chart.  Review of systems: Reviewed 8 systems and notable for the above skin cancer.  All other systems reviewed are unremarkable/negative, unless noted in the HPI. Past medical history, surgical history, family history, social history were also reviewed and are noted in the chart/questionnaire.    PHYSICAL EXAMINATION  General: Well-appearing, in no acute distress, alert and oriented x 4. Vitals reviewed in chart (if available).   Skin: Exam reveals a 1.4 x 1.4 cm erythematous papule and biopsy scar on the right lower back. There are rhytids, telangiectasias, and lentigines, consistent with photodamage.  Biopsy report(s) reviewed, confirming the diagnosis.   ASSESSMENT  1) Melanoma in Situ of the right lower back 2) photodamage 3) solar lentigines   PLAN   1. Due to location, size, histology, or recurrence and the likelihood of subclinical extension as well as the need to conserve normal surrounding tissue, the patient was deemed acceptable for Mohs micrographic surgery (MMS).  The nature and purpose of the procedure, associated benefits and risks including recurrence and scarring, possible complications such as pain, infection, and bleeding, and alternative methods of treatment if appropriate were discussed with the patient during consent. The lesion location was verified by the patient, by reviewing previous notes, pathology reports, and by photographs as well as angulation measurements if available.  Informed consent was reviewed and signed by the patient, and timeout was performed at 8:15 AM. See op note below.  2. For the photodamage and solar lentigines, sun protection discussed/information given on OTC sunscreens, and we recommend continued regular follow-up with primary dermatologist every 6 months or sooner for any growing, bleeding, or changing lesions. 3. Prognosis and future surveillance discussed. 4. Letter with  treatment outcome sent to referring provider. 5. Pain acetaminophen /ibuprofen  MOHS MICROGRAPHIC SURGERY AND RECONSTRUCTION  Initial size:   1.4 x 1.4 cm Surgical defect/wound size: 3.0 x 2.5 cm Anesthesia:    0.33% lidocaine  with 1:200,000 epinephrine  EBL:    <5 mL Complications:  None Repair type:   Complex SQ suture:   3-0 PDS Cutaneous suture:  Cyanoacrylate and Steristrips Final size of the repair: 7.1 cm  Stages: 1  STAGE I: Anesthesia achieved with 0.5% lidocaine  with 1:200,000 epinephrine . ChloraPrep applied. 5 section(s) excised using Mohs technique (this includes total peripheral and deep tissue margin excision and evaluation with frozen sections, excised and interpreted by the same physician). The tumor was first debulked and then excised with an approx. 2mm margin.  Hemostasis was achieved with electrocautery as needed.  The specimen was then oriented, subdivided/relaxed, inked, and processed using Mohs technique.   Tissue was stained with H&E and MART-1 with 1 chromogen.   Frozen section analysis revealed a clear deep and peripheral margin.  Reconstruction  The surgical wound was then cleaned, prepped, and re-anesthetized as above. Wound edges were undermined extensively along at least one entire edge and at a distance equal to or greater than the width of the defect (see wound defect size above) in order to achieve closure and decrease wound tension and anatomic distortion. Redundant tissue repair including standing cone removal was performed. Hemostasis was achieved with electrocautery. Subcutaneous and epidermal tissues were approximated with the above sutures. The surgical site was then lightly scrubbed with sterile, saline-soaked gauze. Steri-strips were applied, and the area was then bandaged using Vaseline ointment, non-adherent gauze, gauze pads, and tape to provide an adequate pressure dressing. The patient  tolerated the procedure well, was given detailed written and  verbal wound care instructions, and was discharged in good condition.   The patient will follow-up: 4 weeks.    Documentation: I have reviewed the above documentation for accuracy and completeness, and I agree with the above.  RUFUS CHRISTELLA HOLY, MD

## 2024-07-02 NOTE — Telephone Encounter (Signed)
 [employer]  2026 RE-ENROLLMENT APPROVAL:

## 2024-07-15 ENCOUNTER — Encounter: Payer: Self-pay | Admitting: Dermatology

## 2024-07-15 ENCOUNTER — Ambulatory Visit: Admitting: Dermatology

## 2024-07-15 VITALS — BP 161/81 | HR 65 | Temp 97.9°F

## 2024-07-15 DIAGNOSIS — D485 Neoplasm of uncertain behavior of skin: Secondary | ICD-10-CM

## 2024-07-15 NOTE — Patient Instructions (Signed)

## 2024-07-15 NOTE — Progress Notes (Signed)
 Follow-Up Visit   Subjective  [name] is a 78 y.o. male who presents for the following: Excision of a subcutaneous nodule on the  right clavicular area.   The following portions of the chart were reviewed this encounter and updated as appropriate: medications, allergies, medical history  Review of Systems:  No other skin or systemic complaints except as noted in HPI or Assessment and Plan.  Objective  Well appearing patient in no apparent distress; mood and affect are within normal limits.  A focused examination was performed of the following areas: Right clavicular area Relevant physical exam findings are noted in the Assessment and Plan.   Right clavicular Area Subcutaneous nodule   Assessment & Plan   NEOPLASM OF UNCERTAIN BEHAVIOR OF SKIN Right clavicular Area Skin excision  Excision method:  elliptical Lesion length (cm):  1.7 Lesion width (cm):  1.4 Margin per side (cm):  0.1 Total excision diameter (cm):  1.9 Informed consent: discussed and consent obtained   Timeout: patient name, date of birth, surgical site, and procedure verified   Procedure prep:  Patient was prepped and draped in usual sterile fashion Prep type:  Chlorhexidine  Anesthesia: the lesion was anesthetized in a standard fashion   Anesthetic:  1% lidocaine  w/ epinephrine  1-100,000 buffered w/ 8.4% NaHCO3 Instrument used: #15 blade   Hemostasis achieved with: suture, pressure and electrodesiccation   Outcome: patient tolerated procedure well with no complications   Post-procedure details: sterile dressing applied and wound care instructions given   Dressing type: pressure dressing and bandage   Additional details:  3.6 final measurements  Skin repair Complexity:  Complex Final length (cm):  3.6 Informed consent: discussed and consent obtained   Timeout: patient name, date of birth, surgical site, and procedure verified   Procedure prep:  Patient was prepped and draped in usual sterile  fashion Prep type:  Chlorhexidine  Anesthesia: the lesion was anesthetized in a standard fashion   Reason for type of repair: allow closure of the large defect, preserve normal anatomy and compensate for the surrounding damaged skin   Undermining: area extensively undermined   Subcutaneous layers (deep stitches):  Suture size:  4-0 Suture type: Vicryl (polyglactin 910)   Stitches:  Buried vertical mattress Fine/surface layer approximation (top stitches):  Suture size:  6-0 Suture type: fast-absorbing plain gut   Stitches: simple running   Hemostasis achieved with: suture, pressure and electrodesiccation Outcome: patient tolerated procedure well with no complications   Post-procedure details: sterile dressing applied and wound care instructions given   Dressing type: bandage and pressure dressing    Specimen 1 - Surgical pathology Differential Diagnosis: R/O cyst vs other  Check Margins: No  The surgical wound was then cleaned, prepped, and re-anesthetized as above. Wound edges were undermined extensively along at least one entire edge and at a distance equal to or greater than the width of the defect (see wound defect size above) in order to achieve closure and decrease wound tension and anatomic distortion. Redundant tissue repair including standing cone removal was performed. Hemostasis was achieved with electrocautery. Subcutaneous and epidermal tissues were approximated with the above sutures. The surgical site was then lightly scrubbed with sterile, saline-soaked gauze. The area was then bandaged using Vaseline ointment, non-adherent gauze, gauze pads, and tape to provide an adequate pressure dressing. The patient tolerated the procedure well, was given detailed written and verbal wound care instructions, and was discharged in good condition.   The patient will follow-up: 4 weeks.  Return if  symptoms worsen or fail to improve.  I, Doyce Pan, CMA, am acting as scribe for RUFUS CHRISTELLA HOLY, MD.   Documentation: I have reviewed the above documentation for accuracy and completeness, and I agree with the above.  RUFUS CHRISTELLA HOLY, MD

## 2024-07-16 LAB — SURGICAL PATHOLOGY

## 2024-07-17 ENCOUNTER — Encounter: Payer: Self-pay | Admitting: Dermatology

## 2024-07-17 ENCOUNTER — Ambulatory Visit: Payer: Self-pay | Admitting: Dermatology

## 2024-07-20 ENCOUNTER — Other Ambulatory Visit: Payer: Self-pay | Admitting: Family Medicine

## 2024-07-21 ENCOUNTER — Telehealth: Payer: Self-pay | Admitting: Cardiology

## 2024-07-21 ENCOUNTER — Ambulatory Visit

## 2024-07-21 MED ORDER — EMPAGLIFLOZIN 10 MG PO TABS: 10.0000 mg | ORAL_TABLET | Freq: Every day | ORAL | 0 refills | Status: DC

## 2024-07-21 NOTE — Telephone Encounter (Signed)
 Patient calling the office for samples of medication:   1.  What medication and dosage are you requesting samples for?   Jardiance  10mg    2.  Are you currently out of this medication?   Pt is out and they will not receive any until after the 1st of the year

## 2024-07-21 NOTE — Telephone Encounter (Signed)
Wife picked up sample

## 2024-07-24 ENCOUNTER — Ambulatory Visit: Attending: Cardiology

## 2024-07-24 DIAGNOSIS — Z5181 Encounter for therapeutic drug level monitoring: Secondary | ICD-10-CM

## 2024-07-24 DIAGNOSIS — I4891 Unspecified atrial fibrillation: Secondary | ICD-10-CM | POA: Diagnosis not present

## 2024-07-24 LAB — POCT INR: INR: 1.9 — AB (ref 2.0–3.0)

## 2024-07-24 NOTE — Progress Notes (Signed)
 INR 1.9; Please see anticoagulation encounter

## 2024-07-24 NOTE — Patient Instructions (Signed)
 Take warfarin 1 1/2 tablets tonight then resume 1/2 tablet daily except 1 tablet on Tuesdays, Thursdays and Saturdays Recheck in 4 weeks.  Call with any new medications.

## 2024-07-25 NOTE — Telephone Encounter (Signed)
 Approval 08/07/24- 08/06/25- attached in media

## 2024-07-29 ENCOUNTER — Ambulatory Visit: Admitting: Dermatology

## 2024-07-29 ENCOUNTER — Encounter: Payer: Self-pay | Admitting: Dermatology

## 2024-07-29 DIAGNOSIS — C44729 Squamous cell carcinoma of skin of left lower limb, including hip: Secondary | ICD-10-CM | POA: Diagnosis not present

## 2024-07-29 DIAGNOSIS — L570 Actinic keratosis: Secondary | ICD-10-CM

## 2024-07-29 DIAGNOSIS — L905 Scar conditions and fibrosis of skin: Secondary | ICD-10-CM | POA: Diagnosis not present

## 2024-07-29 DIAGNOSIS — C44622 Squamous cell carcinoma of skin of right upper limb, including shoulder: Secondary | ICD-10-CM | POA: Diagnosis not present

## 2024-07-29 DIAGNOSIS — C439 Malignant melanoma of skin, unspecified: Secondary | ICD-10-CM

## 2024-07-29 DIAGNOSIS — C44629 Squamous cell carcinoma of skin of left upper limb, including shoulder: Secondary | ICD-10-CM

## 2024-07-29 DIAGNOSIS — C4492 Squamous cell carcinoma of skin, unspecified: Secondary | ICD-10-CM

## 2024-07-29 NOTE — Patient Instructions (Addendum)

## 2024-07-29 NOTE — Progress Notes (Signed)
" ° °  Follow Up Visit   Subjective  [name] is a 78 y.o. male who presents for the following: follow up from Mohs surgery   The patient presents for follow up from Mohs surgery for a MM on the right lower back., treated on 07/01/24, repaired with linear closure. The patient has been bandaging the wound as directed. The endorse the following concerns: none  The following portions of the chart were reviewed this encounter and updated as appropriate: medications, allergies, medical history  Review of Systems:  No other skin or systemic complaints except as noted in HPI or Assessment and Plan.  Objective  Well appearing patient in no apparent distress; mood and affect are within normal limits.  A focal examination was performed including scalp, head, face and right lower back. All findings within normal limits unless otherwise noted below.  Healing wound with mild erythema  Relevant physical exam findings are noted in the Assessment and Plan.  Right Ear x 2, right cheek x1 (3) Erythematous thin papules/macules with gritty scale.   Assessment & Plan   Scar s/p Mohs for MM, treated on 07/01/24, repaired with linear closure. - Reassured that wound is healing well - No evidence of infection - No swelling, induration, purulence, dehiscence, or tenderness out of proportion to the clinical exam, see photo above - Discussed that scars take up to 12 months to mature from the date of surgery - Recommend SPF 30+ to scar daily to prevent purple color from UV exposure during scar maturation process - Discussed that erythema and raised appearance of scar will fade over the next 4-6 months - OK to start scar massage at 4-6 weeks post-op - Can consider silicone based products for scar healing starting at 6 weeks post-op - Ok to continue ointment daily to wound under a bandage for another week  Atypical Squamous Proliferation right and left forearm, left lateral lower leg Exam: see  photos           Treatment Plan: Will inject left lateral lower leg with fluorouricil at next visit, per Erminio AK (ACTINIC KERATOSIS) (3) Right Ear x 2, right cheek x1 (3) - Destruction of lesion - Right Ear x 2, right cheek x1 (3) Complexity: simple   Destruction method: cryotherapy   Outcome: patient tolerated procedure well with no complications   Post-procedure details: wound care instructions given    SCC (SQUAMOUS CELL CARCINOMA)   MALIGNANT MELANOMA OF SKIN (HCC)   SCAR    No follow-ups on file.  I, Darice Smock, CMA, am acting as scribe for RUFUS CHRISTELLA HOLY, MD.   Documentation: I have reviewed the above documentation for accuracy and completeness, and I agree with the above.  RUFUS CHRISTELLA HOLY, MD  "

## 2024-07-31 ENCOUNTER — Inpatient Hospital Stay (HOSPITAL_COMMUNITY)
Admission: EM | Admit: 2024-07-31 | Discharge: 2024-08-07 | DRG: 193 | Disposition: A | Discharge status: Home / Self Care | Attending: Internal Medicine | Admitting: Internal Medicine

## 2024-07-31 ENCOUNTER — Other Ambulatory Visit: Payer: Self-pay

## 2024-07-31 ENCOUNTER — Emergency Department (HOSPITAL_COMMUNITY)

## 2024-07-31 ENCOUNTER — Encounter (HOSPITAL_COMMUNITY): Payer: Self-pay

## 2024-07-31 DIAGNOSIS — Z833 Family history of diabetes mellitus: Secondary | ICD-10-CM

## 2024-07-31 DIAGNOSIS — I4819 Other persistent atrial fibrillation: Secondary | ICD-10-CM | POA: Diagnosis present

## 2024-07-31 DIAGNOSIS — F32A Depression, unspecified: Secondary | ICD-10-CM | POA: Diagnosis present

## 2024-07-31 DIAGNOSIS — Z79899 Other long term (current) drug therapy: Secondary | ICD-10-CM

## 2024-07-31 DIAGNOSIS — J101 Influenza due to other identified influenza virus with other respiratory manifestations: Secondary | ICD-10-CM | POA: Diagnosis present

## 2024-07-31 DIAGNOSIS — Z1152 Encounter for screening for COVID-19: Secondary | ICD-10-CM | POA: Diagnosis not present

## 2024-07-31 DIAGNOSIS — Z808 Family history of malignant neoplasm of other organs or systems: Secondary | ICD-10-CM

## 2024-07-31 DIAGNOSIS — Z66 Do not resuscitate: Secondary | ICD-10-CM | POA: Diagnosis present

## 2024-07-31 DIAGNOSIS — I5022 Chronic systolic (congestive) heart failure: Secondary | ICD-10-CM | POA: Diagnosis present

## 2024-07-31 DIAGNOSIS — J449 Chronic obstructive pulmonary disease, unspecified: Secondary | ICD-10-CM

## 2024-07-31 DIAGNOSIS — N400 Enlarged prostate without lower urinary tract symptoms: Secondary | ICD-10-CM | POA: Diagnosis present

## 2024-07-31 DIAGNOSIS — Z7901 Long term (current) use of anticoagulants: Secondary | ICD-10-CM

## 2024-07-31 DIAGNOSIS — Z8249 Family history of ischemic heart disease and other diseases of the circulatory system: Secondary | ICD-10-CM

## 2024-07-31 DIAGNOSIS — I5032 Chronic diastolic (congestive) heart failure: Secondary | ICD-10-CM | POA: Diagnosis not present

## 2024-07-31 DIAGNOSIS — Z86006 Personal history of melanoma in-situ: Secondary | ICD-10-CM

## 2024-07-31 DIAGNOSIS — I509 Heart failure, unspecified: Secondary | ICD-10-CM

## 2024-07-31 DIAGNOSIS — K219 Gastro-esophageal reflux disease without esophagitis: Secondary | ICD-10-CM | POA: Diagnosis present

## 2024-07-31 DIAGNOSIS — I5043 Acute on chronic combined systolic (congestive) and diastolic (congestive) heart failure: Secondary | ICD-10-CM | POA: Diagnosis not present

## 2024-07-31 DIAGNOSIS — E876 Hypokalemia: Secondary | ICD-10-CM | POA: Diagnosis not present

## 2024-07-31 DIAGNOSIS — Z8551 Personal history of malignant neoplasm of bladder: Secondary | ICD-10-CM

## 2024-07-31 DIAGNOSIS — R0602 Shortness of breath: Secondary | ICD-10-CM | POA: Diagnosis present

## 2024-07-31 DIAGNOSIS — R791 Abnormal coagulation profile: Secondary | ICD-10-CM | POA: Diagnosis present

## 2024-07-31 DIAGNOSIS — F17210 Nicotine dependence, cigarettes, uncomplicated: Secondary | ICD-10-CM | POA: Diagnosis present

## 2024-07-31 DIAGNOSIS — Z8739 Personal history of other diseases of the musculoskeletal system and connective tissue: Secondary | ICD-10-CM

## 2024-07-31 DIAGNOSIS — Z8546 Personal history of malignant neoplasm of prostate: Secondary | ICD-10-CM

## 2024-07-31 DIAGNOSIS — Z7984 Long term (current) use of oral hypoglycemic drugs: Secondary | ICD-10-CM | POA: Diagnosis not present

## 2024-07-31 DIAGNOSIS — I2510 Atherosclerotic heart disease of native coronary artery without angina pectoris: Secondary | ICD-10-CM | POA: Diagnosis present

## 2024-07-31 DIAGNOSIS — E782 Mixed hyperlipidemia: Secondary | ICD-10-CM | POA: Diagnosis present

## 2024-07-31 DIAGNOSIS — M109 Gout, unspecified: Secondary | ICD-10-CM | POA: Diagnosis present

## 2024-07-31 DIAGNOSIS — J111 Influenza due to unidentified influenza virus with other respiratory manifestations: Secondary | ICD-10-CM | POA: Diagnosis not present

## 2024-07-31 DIAGNOSIS — Z951 Presence of aortocoronary bypass graft: Secondary | ICD-10-CM

## 2024-07-31 DIAGNOSIS — Z85820 Personal history of malignant melanoma of skin: Secondary | ICD-10-CM

## 2024-07-31 DIAGNOSIS — J9601 Acute respiratory failure with hypoxia: Secondary | ICD-10-CM | POA: Diagnosis present

## 2024-07-31 DIAGNOSIS — I110 Hypertensive heart disease with heart failure: Secondary | ICD-10-CM | POA: Diagnosis present

## 2024-07-31 DIAGNOSIS — Z515 Encounter for palliative care: Secondary | ICD-10-CM

## 2024-07-31 DIAGNOSIS — I5031 Acute diastolic (congestive) heart failure: Secondary | ICD-10-CM | POA: Diagnosis not present

## 2024-07-31 DIAGNOSIS — I252 Old myocardial infarction: Secondary | ICD-10-CM

## 2024-07-31 DIAGNOSIS — Z720 Tobacco use: Secondary | ICD-10-CM | POA: Diagnosis present

## 2024-07-31 DIAGNOSIS — I5023 Acute on chronic systolic (congestive) heart failure: Secondary | ICD-10-CM

## 2024-07-31 DIAGNOSIS — J441 Chronic obstructive pulmonary disease with (acute) exacerbation: Principal | ICD-10-CM | POA: Diagnosis present

## 2024-07-31 DIAGNOSIS — Z820 Family history of epilepsy and other diseases of the nervous system: Secondary | ICD-10-CM | POA: Diagnosis not present

## 2024-07-31 DIAGNOSIS — E785 Hyperlipidemia, unspecified: Secondary | ICD-10-CM

## 2024-07-31 DIAGNOSIS — Z981 Arthrodesis status: Secondary | ICD-10-CM

## 2024-07-31 LAB — TROPONIN T, HIGH SENSITIVITY
Troponin T High Sensitivity: 35 ng/L — ABNORMAL HIGH (ref 0–19)
Troponin T High Sensitivity: 32 ng/L — ABNORMAL HIGH (ref 0–19)

## 2024-07-31 LAB — COMPREHENSIVE METABOLIC PANEL WITH GFR
ALT: 20 U/L (ref 0–44)
AST: 30 U/L (ref 15–41)
Albumin: 4 g/dL (ref 3.5–5.0)
Alkaline Phosphatase: 109 U/L (ref 38–126)
Anion gap: 16 — ABNORMAL HIGH (ref 5–15)
BUN: 14 mg/dL (ref 8–23)
CO2: 21 mmol/L — ABNORMAL LOW (ref 22–32)
Calcium: 9.2 mg/dL (ref 8.9–10.3)
Chloride: 99 mmol/L (ref 98–111)
Creatinine, Ser: 1.02 mg/dL (ref 0.61–1.24)
GFR, Estimated: >60 mL/min
Glucose, Bld: 91 mg/dL (ref 70–99)
Potassium: 4 mmol/L (ref 3.5–5.1)
Sodium: 136 mmol/L (ref 135–145)
Total Bilirubin: 1.1 mg/dL (ref 0.0–1.2)
Total Protein: 7 g/dL (ref 6.5–8.1)

## 2024-07-31 LAB — CBC WITH DIFFERENTIAL/PLATELET
Abs Immature Granulocytes: 0.04 K/uL (ref 0.00–0.07)
Basophils Absolute: 0 K/uL (ref 0.0–0.1)
Basophils Relative: 0 %
Eosinophils Absolute: 0 K/uL (ref 0.0–0.5)
Eosinophils Relative: 0 %
HCT: 39.9 % (ref 39.0–52.0)
Hemoglobin: 13.2 g/dL (ref 13.0–17.0)
Immature Granulocytes: 0 %
Lymphocytes Relative: 25 %
Lymphs Abs: 2.3 K/uL (ref 0.7–4.0)
MCH: 32.8 pg (ref 26.0–34.0)
MCHC: 33.1 g/dL (ref 30.0–36.0)
MCV: 99 fL (ref 80.0–100.0)
Monocytes Absolute: 0.4 K/uL (ref 0.1–1.0)
Monocytes Relative: 5 %
Neutro Abs: 6.3 K/uL (ref 1.7–7.7)
Neutrophils Relative %: 70 %
Platelets: 176 K/uL (ref 150–400)
RBC: 4.03 MIL/uL — ABNORMAL LOW (ref 4.22–5.81)
RDW: 14.4 % (ref 11.5–15.5)
WBC: 9.1 K/uL (ref 4.0–10.5)
nRBC: 0 % (ref 0.0–0.2)

## 2024-07-31 LAB — RESP PANEL BY RT-PCR (RSV, FLU A&B, COVID)  RVPGX2
Influenza A by PCR: POSITIVE — AB
Influenza B by PCR: NEGATIVE
Resp Syncytial Virus by PCR: NEGATIVE
SARS Coronavirus 2 by RT PCR: NEGATIVE

## 2024-07-31 LAB — PROTIME-INR
INR: 1.8 — ABNORMAL HIGH (ref 0.8–1.2)
Prothrombin Time: 21.8 s — ABNORMAL HIGH (ref 11.4–15.2)

## 2024-07-31 LAB — PRO BRAIN NATRIURETIC PEPTIDE: Pro Brain Natriuretic Peptide: 3210 pg/mL — ABNORMAL HIGH

## 2024-07-31 MED ORDER — IPRATROPIUM-ALBUTEROL 0.5-2.5 (3) MG/3ML IN SOLN
3.0000 mL | Freq: Once | RESPIRATORY_TRACT | Status: AC
  Administered 2024-07-31: 3 mL via RESPIRATORY_TRACT
  Filled 2024-07-31: qty 3

## 2024-07-31 MED ORDER — ALLOPURINOL 100 MG PO TABS
100.0000 mg | ORAL_TABLET | Freq: Every day | ORAL | Status: DC
  Administered 2024-07-31 – 2024-08-07 (×8): 100 mg via ORAL
  Filled 2024-07-31 (×4): qty 1

## 2024-07-31 MED ORDER — BUDESONIDE 0.5 MG/2ML IN SUSP
0.5000 mg | Freq: Two times a day (BID) | RESPIRATORY_TRACT | Status: DC
  Administered 2024-07-31 – 2024-08-07 (×14): 0.5 mg via RESPIRATORY_TRACT
  Filled 2024-07-31 (×7): qty 2

## 2024-07-31 MED ORDER — ONDANSETRON HCL 4 MG PO TABS: 4.0000 mg | ORAL_TABLET | Freq: Four times a day (QID) | ORAL | Status: DC | PRN

## 2024-07-31 MED ORDER — IPRATROPIUM-ALBUTEROL 0.5-2.5 (3) MG/3ML IN SOLN: 3.0000 mL | Freq: Once | RESPIRATORY_TRACT | Status: DC

## 2024-07-31 MED ORDER — ONDANSETRON HCL 4 MG/2ML IJ SOLN: 4.0000 mg | Freq: Four times a day (QID) | INTRAMUSCULAR | Status: DC | PRN

## 2024-07-31 MED ORDER — ARFORMOTEROL TARTRATE 15 MCG/2ML IN NEBU
15.0000 ug | INHALATION_SOLUTION | Freq: Two times a day (BID) | RESPIRATORY_TRACT | Status: DC
  Administered 2024-07-31 – 2024-08-07 (×14): 15 ug via RESPIRATORY_TRACT
  Filled 2024-07-31 (×7): qty 2

## 2024-07-31 MED ORDER — EMPAGLIFLOZIN 10 MG PO TABS
10.0000 mg | ORAL_TABLET | Freq: Every day | ORAL | Status: DC
  Administered 2024-08-01 – 2024-08-07 (×7): 10 mg via ORAL
  Filled 2024-07-31 (×3): qty 1

## 2024-07-31 MED ORDER — FUROSEMIDE 10 MG/ML IJ SOLN
60.0000 mg | Freq: Once | INTRAMUSCULAR | Status: AC
  Administered 2024-07-31: 60 mg via INTRAVENOUS
  Filled 2024-07-31: qty 6

## 2024-07-31 MED ORDER — METOPROLOL SUCCINATE ER 50 MG PO TB24
75.0000 mg | ORAL_TABLET | Freq: Every day | ORAL | Status: DC
  Administered 2024-08-01 – 2024-08-07 (×7): 75 mg via ORAL
  Filled 2024-07-31 (×3): qty 1

## 2024-07-31 MED ORDER — IPRATROPIUM-ALBUTEROL 0.5-2.5 (3) MG/3ML IN SOLN
3.0000 mL | Freq: Four times a day (QID) | RESPIRATORY_TRACT | Status: DC
  Administered 2024-07-31: 3 mL via RESPIRATORY_TRACT
  Filled 2024-07-31: qty 3

## 2024-07-31 MED ORDER — ORAL CARE MOUTH RINSE: 15.0000 mL | OROMUCOSAL | Status: DC | PRN

## 2024-07-31 MED ORDER — WARFARIN SODIUM 5 MG PO TABS
5.0000 mg | ORAL_TABLET | Freq: Once | ORAL | Status: AC
  Administered 2024-07-31: 5 mg via ORAL
  Filled 2024-07-31: qty 1

## 2024-07-31 MED ORDER — WARFARIN - PHARMACIST DOSING INPATIENT: Freq: Every day | Status: DC

## 2024-07-31 MED ORDER — ACETAMINOPHEN 650 MG RE SUPP: 650.0000 mg | Freq: Four times a day (QID) | RECTAL | Status: DC | PRN

## 2024-07-31 MED ORDER — IPRATROPIUM-ALBUTEROL 0.5-2.5 (3) MG/3ML IN SOLN
3.0000 mL | Freq: Three times a day (TID) | RESPIRATORY_TRACT | Status: AC
  Administered 2024-08-01 (×3): 3 mL via RESPIRATORY_TRACT
  Filled 2024-07-31 (×3): qty 3

## 2024-07-31 MED ORDER — ATORVASTATIN CALCIUM 40 MG PO TABS
80.0000 mg | ORAL_TABLET | Freq: Every day | ORAL | Status: DC
  Administered 2024-07-31 – 2024-08-07 (×8): 80 mg via ORAL
  Filled 2024-07-31 (×5): qty 2

## 2024-07-31 MED ORDER — METHYLPREDNISOLONE SODIUM SUCC 125 MG IJ SOLR
60.0000 mg | Freq: Two times a day (BID) | INTRAMUSCULAR | Status: DC
  Administered 2024-07-31 – 2024-08-07 (×14): 60 mg via INTRAVENOUS
  Filled 2024-07-31 (×7): qty 2

## 2024-07-31 MED ORDER — SPIRONOLACTONE 12.5 MG HALF TABLET
12.5000 mg | ORAL_TABLET | Freq: Two times a day (BID) | ORAL | Status: DC
  Administered 2024-08-01 – 2024-08-07 (×13): 12.5 mg via ORAL
  Filled 2024-07-31 (×6): qty 1

## 2024-07-31 MED ORDER — FLUTICASONE PROPIONATE 50 MCG/ACT NA SUSP: 2.0000 | Freq: Every day | NASAL | Status: DC | PRN

## 2024-07-31 MED ORDER — EMPAGLIFLOZIN 10 MG PO TABS: 10.0000 mg | ORAL_TABLET | Freq: Every day | ORAL | Status: DC

## 2024-07-31 MED ORDER — PANTOPRAZOLE SODIUM 40 MG PO TBEC
40.0000 mg | DELAYED_RELEASE_TABLET | Freq: Every day | ORAL | Status: DC
  Administered 2024-07-31 – 2024-08-07 (×8): 40 mg via ORAL
  Filled 2024-07-31 (×5): qty 1

## 2024-07-31 MED ORDER — ACETAMINOPHEN 325 MG PO TABS
650.0000 mg | ORAL_TABLET | Freq: Four times a day (QID) | ORAL | Status: DC | PRN
  Administered 2024-08-01 – 2024-08-04 (×5): 650 mg via ORAL
  Filled 2024-07-31 (×4): qty 2

## 2024-07-31 MED ORDER — METHYLPREDNISOLONE SODIUM SUCC 125 MG IJ SOLR: 125.0000 mg | Freq: Once | INTRAMUSCULAR | Status: DC

## 2024-07-31 NOTE — Hospital Course (Signed)
 55 male with a history of COPD, coronary disease status post MI, hypertension, hyperlipidemia, tobacco abuse, cardiothoracic, persistent atrial fibrillation on warfarin presenting with 1 day history of shortness of breath and worsening cough.  The patient states that he began having worsening shortness of breath and wheezing on the evening of 07/30/2024.  He has a largely nonproductive cough.  He denies any fevers, chills, chest pain, abdominal pain, nausea, vomiting, diarrhea, hematochezia, melena.  There is no dysuria or hematuria.  He continues to smoke 1 pack/day.  He has a 60-pack-year history.  He denies any worsening lower extremity edema or orthopnea or increasing abdominal girth.  He states that he has lost a few pounds in the past few months.  His last weight was 138 pounds about a week prior to this admission. In the ED, the patient was afebrile and hemodynamically stable with oxygen saturation of 86% on room air.  He was placed on 3 L with saturation of 95%.  WBC 9.1, hemoglobin 13.2, platelet 175.  Sodium 136, potassium 4.0, bicarbonate 21, serum creatinine 1.02.  AST 30, ALT 20, alk phosphatase 109, total bilirubin 1.1.  Albumin 4.0.  Chest x-ray showed chronic interstitial markings.  Patient was started on bronchodilators.  He was given a dose of furosemide  in the emergency department.  The patient was given Solu-Medrol  by EMS prior to arrival.

## 2024-07-31 NOTE — Plan of Care (Signed)

## 2024-07-31 NOTE — ED Provider Notes (Signed)
 " [place] EMERGENCY DEPARTMENT AT [facility] Provider Note   CSN: [id number] Arrival date & time: 07/31/24  1231     Patient presents with: Shortness of Breath   [name] is a 78 y.o. male with history of COPD, A-fib on Coumadin , CAD presents with complaints of productive cough that started last night.  Denies any chest pain.  Does endorse shortness of breath.  No significant relief with his albuterol  inhalers.  He is not on oxygen at home.  Denies any fevers, sore throat or nasal congestion.  No abdominal pain or vomiting.  Reports compliance with his Coumadin .    Shortness of Breath     Past Medical History:  Diagnosis Date   Anginal pain    Atrial fibrillation by electrocardiogram (HCC) 11/2019   Atrial flutter by electrocardiogram [facility]) 11/2019   Bladder cancer (HCC) 01/2020   Bladder tumor    BPH (benign prostatic hyperplasia)    CAD (coronary artery disease)    a. s/p INF MI 1997;  b. s/p CABG 2001;  c. LHC 11/2009:  3v CAD, S-PDA ok with 40% mid, S-OM ok, S-Dx ok, L-LAD ok, EF 50%;  d.  Lex MV 5/14:  Inferolateral scar, EF 46%, no ischemia   COPD (chronic obstructive pulmonary disease) (HCC)    Dyspnea    ETOH abuse    GERD (gastroesophageal reflux disease)    History of gout    Hypercholesterolemia    Hypertension    Melanoma (HCC)    Melanoma in situ (HCC) 05/21/2024   right lower back-- Tx Mohs Dr. Corey 07/01/2024   Myocardial infarction [facility]) 2013   Prostate cancer (HCC)    Tobacco abuse    Past Surgical History:  Procedure Laterality Date   ANTERIOR CERVICAL DECOMPRESSION/DISCECTOMY FUSION 4 LEVELS N/A 07/21/2016   Procedure: ANTERIOR CERVICAL DECOMPRESSION/DISCECTOMY FUSION CERVICAL TWO-THREE, CERVICAL THREE-FOUR,. CERVICAL FOUR-FIVE, CERVICAL  FIVE-SIX;  Surgeon: Rockey Peru, MD;  Location: [facility] OR;  Service: Neurosurgery;  Laterality: N/A;   BACK SURGERY     CARDIAC CATHETERIZATION  2011   CARDIOVERSION N/A 06/10/2020   Procedure:  CARDIOVERSION;  Surgeon: Alvan Dorn FALCON, MD;  Location: [facility]O [facility];  Service: Endoscopy;  Laterality: N/A;   COLONOSCOPY     in remote past, Dr. Jakie. Obtaining records.    COLONOSCOPY N/A 11/21/2017   Surgeon: Shaaron Lamar HERO, MD; Three 4-6 mm polyps in the rectum and splenic flexure resected and retrieved (tubular adenomas), nonbleeding internal hemorrhoids.  Repeat in 5 years.   COLONOSCOPY WITH PROPOFOL  N/A 06/09/2021   Surgeon: Shaaron Lamar HERO, MD;  Three 3-7 mm polyps in the ascending colon resected and retrieved, mild radiation proctitis, otherwise normal exam. Pathology revealed tubular adenomas.  Recommended repeat colonoscopy in 5 years.   CORONARY ANGIOPLASTY     CORONARY ARTERY BYPASS GRAFT     CORONARY STENT INTERVENTION N/A 03/27/2022   Procedure: CORONARY STENT INTERVENTION;  Surgeon: Jordan, Peter M, MD;  Location: [facility] INVASIVE CV LAB;  Service: Cardiovascular;  Laterality: N/A;   ESOPHAGEAL DILATION N/A 04/23/2024   Procedure: DILATION, ESOPHAGUS;  Surgeon: Shaaron Lamar HERO, MD;  Location: [facility];  Service: Endoscopy;  Laterality: N/A;   ESOPHAGOGASTRODUODENOSCOPY  05/27/2012   MFM:Wnmfjo esophagus, stomach and duodenum s/p dilator   ESOPHAGOGASTRODUODENOSCOPY N/A 04/23/2024   Procedure: EGD (ESOPHAGOGASTRODUODENOSCOPY);  Surgeon: Shaaron Lamar HERO, MD;  Location: [facility];  Service: Endoscopy;  Laterality: N/A;  900am, asa 3   POLYPECTOMY  11/21/2017  Procedure: POLYPECTOMY;  Surgeon: Shaaron Lamar HERO, MD;  Location: [facility];  Service: Endoscopy;;  colon   POLYPECTOMY  06/09/2021   Procedure: POLYPECTOMY;  Surgeon: Shaaron Lamar HERO, MD;  Location: [facility];  Service: Endoscopy;;   PROSTATE SURGERY     RIGHT/LEFT HEART CATH AND CORONARY/GRAFT ANGIOGRAPHY N/A 03/27/2022   Procedure: RIGHT/LEFT HEART CATH AND CORONARY/GRAFT ANGIOGRAPHY;  Surgeon: Jordan, Peter M, MD;  Location: [facility] INVASIVE CV LAB;  Service: Cardiovascular;  Laterality: N/A;    TRANSURETHRAL RESECTION OF BLADDER TUMOR WITH MITOMYCIN -C Left 01/06/2020   Procedure: CYSTOSCOPY TRANSURETHRAL RESECTION OF BLADDER TUMOR,LEFT URETERSCOPY AND GEMCITABINE ;  Surgeon: Watt Rush, MD;  Location: [facility] ORS;  Service: Urology;  Laterality: Left;     Prior to Admission medications  Medication Sig Start Date End Date Taking? Authorizing Provider  acetaminophen  (TYLENOL ) 500 MG tablet Take 2 tablets (1,000 mg total) by mouth every 6 (six) hours as needed for moderate pain or headache. 03/03/20   Zollie Lowers, MD  albuterol  (VENTOLIN  HFA) 108 (90 Base) MCG/ACT inhaler Inhale 2 puffs into the lungs every 6 (six) hours as needed for wheezing or shortness of breath. 04/24/24   Zollie Lowers, MD  allopurinol  (ZYLOPRIM ) 100 MG tablet Take 1 tablet (100 mg total) by mouth daily. 04/24/24   Zollie Lowers, MD  atorvastatin  (LIPITOR ) 80 MG tablet Take 1 tablet (80 mg total) by mouth daily. 04/24/24   Zollie Lowers, MD  azelastine  (ASTELIN ) 0.1 % nasal spray Place 2 sprays into both nostrils 2 (two) times daily. Use in each nostril as directed 01/10/24 05/15/24  Patel, Kunjan B, MD  B Complex Vitamins (VITAMIN B COMPLEX PO) Take 1 tablet by mouth daily.    [provider]  budesonide -glycopyrrolate-formoterol  (BREZTRI  AEROSPHERE) 160-9-4.8 MCG/ACT AERO inhaler Inhale 2 puffs into the lungs in the morning and at bedtime. 05/02/24   Zollie Lowers, MD  Cholecalciferol (VITAMIN D3) 125 MCG (5000 UT) CAPS Take 5,000 Units by mouth daily.    [provider]  empagliflozin  (JARDIANCE ) 10 MG TABS tablet Take 1 tablet (10 mg total) by mouth daily before breakfast. 06/06/23   Alvan Dorn FALCON, MD  empagliflozin  (JARDIANCE ) 10 MG TABS tablet Take 1 tablet (10 mg total) by mouth daily before breakfast. 07/21/24   Alvan Dorn FALCON, MD  fluticasone  (FLONASE ) 50 MCG/ACT nasal spray Place 2 sprays into both nostrils daily as needed for allergies. 04/24/24   Zollie Lowers, MD  furosemide  (LASIX ) 20  MG tablet Take 1 tablet (20 mg total) by mouth daily as needed for edema (swelling). 05/12/24   Zollie Lowers, MD  ipratropium (ATROVENT ) 0.06 % nasal spray Place 2 sprays into both nostrils 2 (two) times daily as needed. 02/22/24   Tobie Eldora NOVAK, MD  magnesium  oxide (MAG-OX) 400 (240 Mg) MG tablet Take 1 tablet by mouth daily. 03/29/23   [provider]  metoprolol  succinate (TOPROL -XL) 50 MG 24 hr tablet TAKE 1 AND 1/2 TABLETS BY MOUTH  DAILY 06/05/24   Zollie Lowers, MD  nitroGLYCERIN  (NITROSTAT ) 0.4 MG SL tablet Place 1 tablet (0.4 mg total) under the tongue every 5 (five) minutes as needed for chest pain. 08/21/23   Miriam Norris, NP  pantoprazole  (PROTONIX ) 40 MG tablet TAKE 1 TABLET BY MOUTH DAILY 06/05/24   Zollie Lowers, MD  spironolactone  (ALDACTONE ) 25 MG tablet Take 0.5 tablets (12.5 mg total) by mouth 2 (two) times daily. 04/24/24   Zollie Lowers, MD  warfarin (COUMADIN ) 5 MG tablet TAKE 1 TABLET BY MOUTH  DAILY 07/21/24   Zollie Lowers, MD    Allergies: Patient has no known allergies.    Review of Systems  Respiratory:  Positive for shortness of breath.     Updated Vital Signs BP 117/66   Pulse (!) 101   Temp 99.4 F (37.4 C) (Oral)   Resp 20   Ht 5' 6 (1.676 m)   Wt 65.7 kg   SpO2 94%   BMI 23.38 kg/m   Physical Exam Vitals and nursing note reviewed.  Constitutional:      General: He is not in acute distress.    Appearance: He is well-developed.  HENT:     Head: Normocephalic and atraumatic.  Eyes:     Conjunctiva/sclera: Conjunctivae normal.  Cardiovascular:     Rate and Rhythm: Normal rate and regular rhythm.     Heart sounds: No murmur heard. Pulmonary:     Effort: Pulmonary effort is normal. No respiratory distress.     Comments: Diffuse rhonchi and wheezing without obvious rales Abdominal:     Palpations: Abdomen is soft.     Tenderness: There is no abdominal tenderness.  Musculoskeletal:        General: No swelling.     Cervical back:  Neck supple.  Skin:    General: Skin is warm and dry.     Capillary Refill: Capillary refill takes less than 2 seconds.  Neurological:     Mental Status: He is alert.  Psychiatric:        Mood and Affect: Mood normal.     (all labs ordered are listed, but only abnormal results are displayed) Labs Reviewed  RESP PANEL BY RT-PCR (RSV, FLU A&B, COVID)  RVPGX2 - Abnormal; Notable for the following components:      Result Value   Influenza A by PCR POSITIVE (*)    All other components within normal limits  CBC WITH DIFFERENTIAL/PLATELET - Abnormal; Notable for the following components:   RBC 4.03 (*)    All other components within normal limits  COMPREHENSIVE METABOLIC PANEL WITH GFR - Abnormal; Notable for the following components:   CO2 21 (*)    Anion gap 16 (*)    All other components within normal limits  PRO BRAIN NATRIURETIC PEPTIDE - Abnormal; Notable for the following components:   Pro Brain Natriuretic Peptide 3,210.0 (*)    All other components within normal limits  PROTIME-INR - Abnormal; Notable for the following components:   Prothrombin Time 21.8 (*)    INR 1.8 (*)    All other components within normal limits  TROPONIN T, HIGH SENSITIVITY - Abnormal; Notable for the following components:   Troponin T High Sensitivity 35 (*)    All other components within normal limits  TROPONIN T, HIGH SENSITIVITY    EKG: EKG Interpretation Date/Time:  Thursday July 31 2024 12:40:17 EST Ventricular Rate:  104 PR Interval:    QRS Duration:  120 QT Interval:  363 QTC Calculation: 459 R Axis:   95  Text Interpretation: Atrial fibrillation Nonspecific intraventricular conduction delay Probable anterolateral infarct, old Confirmed by Ula Barter [phone]) on 07/31/2024 12:45:45 PM  Radiology: [facility] Chest Port 1 View Result Date: 07/31/2024 CLINICAL DATA:  Weakness and shortness of breath. EXAM: PORTABLE CHEST 1 VIEW COMPARISON:  May 14, 2023 FINDINGS: Multiple sternal wires  and vascular clips are seen. The heart size and mediastinal contours are within normal limits. Mild, diffuse, chronic appearing increased interstitial lung markings are noted. No focal consolidation, pleural effusion or pneumothorax  is identified. Postoperative changes are seen involving the lower cervical spine. Chronic right rib fractures are present. No acute osseous abnormalities are identified. IMPRESSION: 1. Evidence of prior median sternotomy/CABG. 2. No acute or active cardiopulmonary disease. Electronically Signed   By: Suzen Dials M.D.   On: 07/31/2024 13:16     .Critical Care  Performed by: Donnajean Lynwood DEL, PA-C Authorized by: Donnajean Lynwood DEL, PA-C   Critical care provider statement:    Critical care time (minutes):  30   Critical care was necessary to treat or prevent imminent or life-threatening deterioration of the following conditions:  Cardiac failure and respiratory failure   Critical care was time spent personally by me on the following activities:  Development of treatment plan with patient or surrogate, discussions with consultants, evaluation of patient's response to treatment, examination of patient, ordering and review of laboratory studies, ordering and review of radiographic studies, ordering and performing treatments and interventions, pulse oximetry, re-evaluation of patient's condition and review of old charts    Medications Ordered in the ED  ipratropium-albuterol  (DUONEB) 0.5-2.5 (3) MG/3ML nebulizer solution 3 mL (3 mLs Nebulization Given 07/31/24 1318)  furosemide  (LASIX ) injection 60 mg (60 mg Intravenous Given 07/31/24 1411)  ipratropium-albuterol  (DUONEB) 0.5-2.5 (3) MG/3ML nebulizer solution 3 mL (3 mLs Nebulization Given by Other 07/31/24 1404)  ipratropium-albuterol  (DUONEB) 0.5-2.5 (3) MG/3ML nebulizer solution 3 mL (3 mLs Nebulization Given 07/31/24 1403)    Clinical Course as of 07/31/24 1514  Thu Jul 31, 2024  1304 Patient with history of  COPD and A-fib on Coumadin  evaluated for shortness of breath with associated productive cough that started last night.  Brought in via EMS provided DuoNeb and Solu-Medrol  125 and found to be hypoxic.  Denies significant chest pain.  Upon arrival patient is mildly tachycardic with a temp of 99.4, otherwise hemodynamically stable.  On exam he has diffuse wheezes and rhonchi without any obvious rales.  He is satting well on 2 L nasal cannula.  Is not on oxygen at home.  Will provide breathing treatments and obtain cardiac and respiratory workup. [JT]  1309 CBC with Differential(!) No leukocytosis, hemoglobin stable [JT]  1321 DG Chest Port 1 View No acute cardiopulmonary disease [JT]  1413 Resp panel by RT-PCR (RSV, Flu A&B, Covid) Anterior Nasal Swab(!) Positive influenza [JT]  1414 Pro Brain natriuretic peptide(!) Greater than 3000 [JT]  1414 Patient will be admitted for acute decompensated heart failure with COPD exacerbation and influenza.  Hospitalist consulted [JT]  1510 Discussed patient with Dr. Evonnie, agreed for admission [JT]    Clinical Course User Index [JT] Donnajean Lynwood DEL, PA-C                                 Medical Decision Making Amount and/or Complexity of Data Reviewed Labs: ordered. Radiology: ordered.   This patient presents to the ED with chief complaint(s) of Cough.  The complaint involves an extensive differential diagnosis and also carries with it a high risk of complications and morbidity.   Pertinent past medical history as listed in HPI  The differential diagnosis includes  CHF exacerbation, COPD exacerbation, pneumonia, URI, ACS, PE Additional history obtained: Additional history obtained from spouse Records reviewed Care Everywhere/External Records  Disposition:   Patient mated for further management  Social Determinants of Health:   none  This note was dictated with voice recognition software.  Despite best efforts at proofreading, errors may have  occurred which can change the documentation meaning.       Final diagnoses:  COPD exacerbation (HCC)  Acute decompensated heart failure [facility])  Influenza    ED Discharge Orders     None          Donnajean Lynwood VEAR DEVONNA 07/31/24 1514    Ula Prentice SAUNDERS, MD 07/31/24 1548  "

## 2024-07-31 NOTE — Progress Notes (Signed)
 PHARMACY - ANTICOAGULATION CONSULT NOTE  Pharmacy Consult for warfarin Indication: atrial fibrillation  Allergies[1]  Patient Measurements: Height: 5' 6 (167.6 cm) Weight: 65.7 kg (144 lb 13.5 oz) IBW/kg (Calculated) : 63.8 HEPARIN  DW (KG): 65.7  Vital Signs: Temp: 99.4 F (37.4 C) (12/25 1237) Temp Source: Oral (12/25 1237) BP: 117/66 (12/25 1237) Pulse Rate: 101 (12/25 1237)  Labs: Recent Labs    07/31/24 1244 07/31/24 1304  HGB 13.2  --   HCT 39.9  --   PLT 176  --   LABPROT  --  21.8*  INR  --  1.8*  CREATININE 1.02  --     Estimated Creatinine Clearance: 55.6 mL/min (by C-G formula based on SCr of 1.02 mg/dL).   Medical History: Past Medical History:  Diagnosis Date   Anginal pain    Atrial fibrillation by electrocardiogram (HCC) 11/2019   Atrial flutter by electrocardiogram [facility]) 11/2019   Bladder cancer (HCC) 01/2020   Bladder tumor    BPH (benign prostatic hyperplasia)    CAD (coronary artery disease)    a. s/p INF MI 1997;  b. s/p CABG 2001;  c. LHC 11/2009:  3v CAD, S-PDA ok with 40% mid, S-OM ok, S-Dx ok, L-LAD ok, EF 50%;  d.  Lex MV 5/14:  Inferolateral scar, EF 46%, no ischemia   COPD (chronic obstructive pulmonary disease) (HCC)    Dyspnea    ETOH abuse    GERD (gastroesophageal reflux disease)    History of gout    Hypercholesterolemia    Hypertension    Melanoma (HCC)    Melanoma in situ (HCC) 05/21/2024   right lower back-- Tx Mohs Dr. Corey 07/01/2024   Myocardial infarction [facility]) 2013   Prostate cancer [facility])    Tobacco abuse     Assessment: 78 year old male on warfarin prior to admit for afib. INR 1.8 on admit this afternoon. Patient takes 5mg  on Tuesdays, Thursdays, and Saturdays, 2.5mg  all other days. CBC appears normal.    Goal of Therapy:  INR 2-3 Monitor platelets by anticoagulation protocol: Yes   Plan:  Warfarin 5mg  tonight Daily INR for now  Dempsey Blush PharmD., BCPS Clinical Pharmacist 07/31/2024 3:44 PM      [1] No Known Allergies

## 2024-07-31 NOTE — ED Triage Notes (Signed)
 Pt arrived via [facility] from home c/o SOB and coughing since last night. Per EMSl, Pt found to be 80% O2 on room air upon arrival. EMS established IV access on scene and administered 2 DuoNeb Treatments and 125mg  SoluMedrol. Pt observed to drop to 86% O2 on room air in Triage and placed on 2L Nasal Cannula.

## 2024-07-31 NOTE — ED Notes (Signed)
 Report given to 300 nurse, transport called.

## 2024-07-31 NOTE — Progress Notes (Signed)
 Responded to nursing call: yellow mews    Subjective: Pt states he is breathing better than when he was in ED.  Denies cp, sob, n/v/d  Vitals:   07/31/24 1318 07/31/24 1404 07/31/24 1406 07/31/24 1621  BP:    108/68  Pulse:    92  Resp:    (!) 26  Temp:    98.6 F (37 C)  TempSrc:    Oral  SpO2: 94% 94% 94% 93%  Weight:      Height:       CV--RRR Lung--bibasilar rales.  +wheeze Abd--soft+BS/NT   Assessment/Plan: COPD Ex -continue duonebs, brovana , pulmicort  -continue steroids     Alm Schneider, DO [facility]

## 2024-07-31 NOTE — H&P (Signed)
 " History and Physical    Patient: [name] FMW:[id number] DOB: 1946/03/08 DOA: 07/31/2024 DOS: the patient was seen and examined on 07/31/2024 PCP: Zollie Lowers, MD  Patient coming from: Home  Chief Complaint:  Chief Complaint  Patient presents with   Shortness of Breath   HPI: [name] is a  7 male with a history of COPD, coronary disease status post MI, hypertension, hyperlipidemia, tobacco abuse, cardiothoracic, persistent atrial fibrillation on warfarin presenting with 1 day history of shortness of breath and worsening cough.  The patient states that he began having worsening shortness of breath and wheezing on the evening of 07/30/2024.  He has a largely nonproductive cough.  He denies any fevers, chills, chest pain, abdominal pain, nausea, vomiting, diarrhea, hematochezia, melena.  There is no dysuria or hematuria.  He continues to smoke 1 pack/day.  He has a 60-pack-year history.  He denies any worsening lower extremity edema or orthopnea or increasing abdominal girth.  He states that he has lost a few pounds in the past few months.  His last weight was 138 pounds about a week prior to this admission. In the ED, the patient was afebrile and hemodynamically stable with oxygen saturation of 86% on room air.  He was placed on 3 L with saturation of 95%.  WBC 9.1, hemoglobin 13.2, platelet 175.  Sodium 136, potassium 4.0, bicarbonate 21, serum creatinine 1.02.  AST 30, ALT 20, alk phosphatase 109, total bilirubin 1.1.  Albumin 4.0.  Chest x-ray showed chronic interstitial markings.  Patient was started on bronchodilators.  He was given a dose of furosemide  in the emergency department.  The patient was given Solu-Medrol  by EMS prior to arrival.  Review of Systems: As mentioned in the history of present illness. All other systems reviewed and are negative. Past Medical History:  Diagnosis Date   Anginal pain    Atrial fibrillation by electrocardiogram (HCC) 11/2019   Atrial flutter  by electrocardiogram [facility]) 11/2019   Bladder cancer (HCC) 01/2020   Bladder tumor    BPH (benign prostatic hyperplasia)    CAD (coronary artery disease)    a. s/p INF MI 1997;  b. s/p CABG 2001;  c. LHC 11/2009:  3v CAD, S-PDA ok with 40% mid, S-OM ok, S-Dx ok, L-LAD ok, EF 50%;  d.  Lex MV 5/14:  Inferolateral scar, EF 46%, no ischemia   COPD (chronic obstructive pulmonary disease) (HCC)    Dyspnea    ETOH abuse    GERD (gastroesophageal reflux disease)    History of gout    Hypercholesterolemia    Hypertension    Melanoma (HCC)    Melanoma in situ (HCC) 05/21/2024   right lower back-- Tx Mohs Dr. Corey 07/01/2024   Myocardial infarction [facility]) 2013   Prostate cancer (HCC)    Tobacco abuse    Past Surgical History:  Procedure Laterality Date   ANTERIOR CERVICAL DECOMPRESSION/DISCECTOMY FUSION 4 LEVELS N/A 07/21/2016   Procedure: ANTERIOR CERVICAL DECOMPRESSION/DISCECTOMY FUSION CERVICAL TWO-THREE, CERVICAL THREE-FOUR,. CERVICAL FOUR-FIVE, CERVICAL  FIVE-SIX;  Surgeon: Rockey Peru, MD;  Location: [facility] OR;  Service: Neurosurgery;  Laterality: N/A;   BACK SURGERY     CARDIAC CATHETERIZATION  2011   CARDIOVERSION N/A 06/10/2020   Procedure: CARDIOVERSION;  Surgeon: Alvan Dorn FALCON, MD;  Location: [facility] ENDO SUITE;  Service: Endoscopy;  Laterality: N/A;   COLONOSCOPY     in remote past, Dr. Jakie. Obtaining records.    COLONOSCOPY N/A 11/21/2017   Surgeon: Shaaron Charleston  M, MD; Three 4-6 mm polyps in the rectum and splenic flexure resected and retrieved (tubular adenomas), nonbleeding internal hemorrhoids.  Repeat in 5 years.   COLONOSCOPY WITH PROPOFOL  N/A 06/09/2021   Surgeon: Shaaron Lamar HERO, MD;  Three 3-7 mm polyps in the ascending colon resected and retrieved, mild radiation proctitis, otherwise normal exam. Pathology revealed tubular adenomas.  Recommended repeat colonoscopy in 5 years.   CORONARY ANGIOPLASTY     CORONARY ARTERY BYPASS GRAFT     CORONARY STENT INTERVENTION N/A  03/27/2022   Procedure: CORONARY STENT INTERVENTION;  Surgeon: Jordan, Peter M, MD;  Location: [facility] INVASIVE CV LAB;  Service: Cardiovascular;  Laterality: N/A;   ESOPHAGEAL DILATION N/A 04/23/2024   Procedure: DILATION, ESOPHAGUS;  Surgeon: Shaaron Lamar HERO, MD;  Location: [facility];  Service: Endoscopy;  Laterality: N/A;   ESOPHAGOGASTRODUODENOSCOPY  05/27/2012   MFM:Wnmfjo esophagus, stomach and duodenum s/p dilator   ESOPHAGOGASTRODUODENOSCOPY N/A 04/23/2024   Procedure: EGD (ESOPHAGOGASTRODUODENOSCOPY);  Surgeon: Shaaron Lamar HERO, MD;  Location: [facility];  Service: Endoscopy;  Laterality: N/A;  900am, asa 3   POLYPECTOMY  11/21/2017   Procedure: POLYPECTOMY;  Surgeon: Shaaron Lamar HERO, MD;  Location: [facility];  Service: Endoscopy;;  colon   POLYPECTOMY  06/09/2021   Procedure: POLYPECTOMY;  Surgeon: Shaaron Lamar HERO, MD;  Location: [facility];  Service: Endoscopy;;   PROSTATE SURGERY     RIGHT/LEFT HEART CATH AND CORONARY/GRAFT ANGIOGRAPHY N/A 03/27/2022   Procedure: RIGHT/LEFT HEART CATH AND CORONARY/GRAFT ANGIOGRAPHY;  Surgeon: Jordan, Peter M, MD;  [facility]VAS[facility] CV LAB;  Service: Cardiovascular;  Laterality: N/A;   TRANSURETHRAL RESECTION OF BLADDER TUMOR WITH MITOMYCIN -C Left 01/06/2020   Procedure: CYSTOSCOPY TRANSURETHRAL RESECTION OF BLADDER TUMOR,LEFT URETERSCOPY AND GEMCITABINE ;  Surgeon: Watt Rush, MD;  Location: [facility] ORS;  Service: Urology;  Laterality: Left;   Social History:  reports that he has been smoking cigarettes and e-cigarettes. He started smoking about 54 years ago. He has a 59.4 pack-year smoking history. He has never been exposed to tobacco smoke. He quit smokeless tobacco use about 12 years ago.  His smokeless tobacco use included snuff. He reports that he does not currently use alcohol after a past usage of about 2.0 - 4.0 standard drinks of alcohol per week. He reports that he does not use drugs.  Allergies[1]  Family History  Problem Relation Age  of Onset   Diabetes Mother    Hypertension Father    Heart disease Brother    Diabetes Brother    Heart disease Brother    Diabetes Brother    Skin cancer Brother    Multiple sclerosis Daughter    Healthy Son    Colon cancer Neg Hx    Inflammatory bowel disease Neg Hx     Prior to Admission medications  Medication Sig Start Date End Date Taking? Authorizing Provider  acetaminophen  (TYLENOL ) 500 MG tablet Take 2 tablets (1,000 mg total) by mouth every 6 (six) hours as needed for moderate pain or headache. 03/03/20   Zollie Lowers, MD  albuterol  (VENTOLIN  HFA) 108 (90 Base) MCG/ACT inhaler Inhale 2 puffs into the lungs every 6 (six) hours as needed for wheezing or shortness of breath. 04/24/24   Zollie Lowers, MD  allopurinol  (ZYLOPRIM ) 100 MG tablet Take 1 tablet (100 mg total) by mouth daily. 04/24/24   Zollie Lowers, MD  atorvastatin  (LIPITOR ) 80 MG tablet Take 1 tablet (80 mg total) by mouth daily. 04/24/24   Zollie Lowers, MD  azelastine  (ASTELIN ) 0.1 % nasal spray Place 2 sprays into both nostrils 2 (two) times daily. Use in each nostril as directed 01/10/24 05/15/24  Patel, Kunjan B, MD  B Complex Vitamins (VITAMIN B COMPLEX PO) Take 1 tablet by mouth daily.    [provider]  budesonide -glycopyrrolate-formoterol  (BREZTRI  AEROSPHERE) 160-9-4.8 MCG/ACT AERO inhaler Inhale 2 puffs into the lungs in the morning and at bedtime. 05/02/24   Zollie Lowers, MD  Cholecalciferol (VITAMIN D3) 125 MCG (5000 UT) CAPS Take 5,000 Units by mouth daily.    [provider]  empagliflozin  (JARDIANCE ) 10 MG TABS tablet Take 1 tablet (10 mg total) by mouth daily before breakfast. 06/06/23   Alvan Dorn FALCON, MD  empagliflozin  (JARDIANCE ) 10 MG TABS tablet Take 1 tablet (10 mg total) by mouth daily before breakfast. 07/21/24   Alvan Dorn FALCON, MD  fluticasone  (FLONASE ) 50 MCG/ACT nasal spray Place 2 sprays into both nostrils daily as needed for allergies. 04/24/24   Zollie Lowers, MD   furosemide  (LASIX ) 20 MG tablet Take 1 tablet (20 mg total) by mouth daily as needed for edema (swelling). 05/12/24   Zollie Lowers, MD  ipratropium (ATROVENT ) 0.06 % nasal spray Place 2 sprays into both nostrils 2 (two) times daily as needed. 02/22/24   Tobie Eldora NOVAK, MD  magnesium  oxide (MAG-OX) 400 (240 Mg) MG tablet Take 1 tablet by mouth daily. 03/29/23   [provider]  metoprolol  succinate (TOPROL -XL) 50 MG 24 hr tablet TAKE 1 AND 1/2 TABLETS BY MOUTH  DAILY 06/05/24   Zollie Lowers, MD  nitroGLYCERIN  (NITROSTAT ) 0.4 MG SL tablet Place 1 tablet (0.4 mg total) under the tongue every 5 (five) minutes as needed for chest pain. 08/21/23   Miriam Norris, NP  pantoprazole  (PROTONIX ) 40 MG tablet TAKE 1 TABLET BY MOUTH DAILY 06/05/24   Zollie Lowers, MD  spironolactone  (ALDACTONE ) 25 MG tablet Take 0.5 tablets (12.5 mg total) by mouth 2 (two) times daily. 04/24/24   Zollie Lowers, MD  warfarin (COUMADIN ) 5 MG tablet TAKE 1 TABLET BY MOUTH DAILY 07/21/24   Zollie Lowers, MD    Physical Exam: Vitals:   07/31/24 1241 07/31/24 1318 07/31/24 1404 07/31/24 1406  BP:      Pulse:      Resp:      Temp:      TempSrc:      SpO2:  94% 94% 94%  Weight: 65.7 kg     Height: 5' 6 (1.676 m)      GENERAL:  A&O x 3, NAD, well developed, cooperative, follows commands HEENT: [place]/AT, No thrush, No icterus, No oral ulcers Neck:  No neck mass, No meningismus, soft, supple CV: RRR, no S3, no S4, no rub, no JVD Lungs: Bilateral rales.  Bilateral wheeze. Abd: soft/NT +BS, nondistended Ext: No edema, no lymphangitis, no cyanosis, no rashes Neuro:  CN II-XII intact, strength 4/5 in RUE, RLE, strength 4/5 LUE, LLE; sensation intact bilateral; no dysmetria; babinski equivocal  Data Reviewed: Data reviewed above in the history  Assessment and Plan: Acute respiratory failure with hypoxia - Secondary to influenza and COPD exacerbation - Stable on 2-3 L - Wean oxygen as tolerated for saturation  greater 92%  COPD exacerbation - Start Brovana  - Start Pulmicort  - Continue IV Solu-Medrol  - Continue DuoNebs  Persistent atrial fibrillation - Continue metoprolol  succinate - Continue warfarin  Coronary artery disease - No chest pain presently -- 03/27/22 cath as reported below, PCI to SVG-OM1  - History of MI 1997 status  post CABG - Continue metoprolol  succinate and statin  Chronic HFimpEF 02/2022 echo LVEF 40-45%, mild RV dysfunction, mild to mod MR - 06/2022 echo: LVEF 45-50% - 05/2023 echo: LVEF 50%, no WMAs, indet diastolic. Normal RV, mild MR  GERD - Continue PPI  Melanoma - Patient follows dermatology    Advance Care Planning: FULL  Consults: none  Family Communication: wife 12/25  Severity of Illness: The appropriate patient status for this patient is INPATIENT. Inpatient status is judged to be reasonable and necessary in order to provide the required intensity of service to ensure the patient's safety. The patient's presenting symptoms, physical exam findings, and initial radiographic and laboratory data in the context of their chronic comorbidities is felt to place them at high risk for further clinical deterioration. Furthermore, it is not anticipated that the patient will be medically stable for discharge from the hospital within 2 midnights of admission.   * I certify that at the point of admission it is my clinical judgment that the patient will require inpatient hospital care spanning beyond 2 midnights from the point of admission due to high intensity of service, high risk for further deterioration and high frequency of surveillance required.*  Author: Alm Schneider, MD 07/31/2024 2:50 PM  For on call review [email or web address].     [1] No Known Allergies  "

## 2024-08-01 DIAGNOSIS — J9601 Acute respiratory failure with hypoxia: Secondary | ICD-10-CM | POA: Diagnosis not present

## 2024-08-01 DIAGNOSIS — J441 Chronic obstructive pulmonary disease with (acute) exacerbation: Secondary | ICD-10-CM | POA: Diagnosis not present

## 2024-08-01 DIAGNOSIS — I4819 Other persistent atrial fibrillation: Secondary | ICD-10-CM | POA: Diagnosis not present

## 2024-08-01 LAB — BASIC METABOLIC PANEL WITH GFR
Anion gap: 11 (ref 5–15)
BUN: 26 mg/dL — ABNORMAL HIGH (ref 8–23)
CO2: 28 mmol/L (ref 22–32)
Calcium: 9.2 mg/dL (ref 8.9–10.3)
Chloride: 96 mmol/L — ABNORMAL LOW (ref 98–111)
Creatinine, Ser: 1.32 mg/dL — ABNORMAL HIGH (ref 0.61–1.24)
GFR, Estimated: 56 mL/min — ABNORMAL LOW
Glucose, Bld: 164 mg/dL — ABNORMAL HIGH (ref 70–99)
Potassium: 3.7 mmol/L (ref 3.5–5.1)
Sodium: 135 mmol/L (ref 135–145)

## 2024-08-01 LAB — CBC
HCT: 38.8 % — ABNORMAL LOW (ref 39.0–52.0)
Hemoglobin: 13.1 g/dL (ref 13.0–17.0)
MCH: 32.7 pg (ref 26.0–34.0)
MCHC: 33.8 g/dL (ref 30.0–36.0)
MCV: 96.8 fL (ref 80.0–100.0)
Platelets: 177 K/uL (ref 150–400)
RBC: 4.01 MIL/uL — ABNORMAL LOW (ref 4.22–5.81)
RDW: 14.2 % (ref 11.5–15.5)
WBC: 10.3 K/uL (ref 4.0–10.5)
nRBC: 0 % (ref 0.0–0.2)

## 2024-08-01 LAB — PROTIME-INR
INR: 1.7 — ABNORMAL HIGH (ref 0.8–1.2)
Prothrombin Time: 20.6 s — ABNORMAL HIGH (ref 11.4–15.2)

## 2024-08-01 LAB — MAGNESIUM: Magnesium: 1.9 mg/dL (ref 1.7–2.4)

## 2024-08-01 MED ORDER — REVEFENACIN 175 MCG/3ML IN SOLN
175.0000 ug | Freq: Every day | RESPIRATORY_TRACT | Status: DC
  Administered 2024-08-02 – 2024-08-07 (×6): 175 ug via RESPIRATORY_TRACT
  Filled 2024-08-01 (×3): qty 3

## 2024-08-01 MED ORDER — OSELTAMIVIR PHOSPHATE 30 MG PO CAPS
30.0000 mg | ORAL_CAPSULE | Freq: Two times a day (BID) | ORAL | Status: AC
  Administered 2024-08-01 – 2024-08-06 (×10): 30 mg via ORAL
  Filled 2024-08-01 (×5): qty 1

## 2024-08-01 MED ORDER — ALBUTEROL SULFATE (2.5 MG/3ML) 0.083% IN NEBU
2.5000 mg | INHALATION_SOLUTION | RESPIRATORY_TRACT | Status: DC | PRN
  Administered 2024-08-01 – 2024-08-03 (×4): 2.5 mg via RESPIRATORY_TRACT
  Filled 2024-08-01 (×4): qty 3

## 2024-08-01 MED ORDER — ALBUTEROL SULFATE (2.5 MG/3ML) 0.083% IN NEBU
2.5000 mg | INHALATION_SOLUTION | Freq: Four times a day (QID) | RESPIRATORY_TRACT | Status: DC
  Administered 2024-08-02 – 2024-08-07 (×21): 2.5 mg via RESPIRATORY_TRACT
  Filled 2024-08-01 (×11): qty 3

## 2024-08-01 MED ORDER — WARFARIN SODIUM 7.5 MG PO TABS
7.5000 mg | ORAL_TABLET | Freq: Once | ORAL | Status: AC
  Administered 2024-08-01: 7.5 mg via ORAL
  Filled 2024-08-01: qty 1

## 2024-08-01 NOTE — Progress Notes (Signed)
 "          PROGRESS NOTE  [name] FMW:[id number] DOB: Feb 15, 1946 DOA: 07/31/2024 PCP: Zollie Lowers, MD  Brief History:  22 male with a history of COPD, coronary disease status post MI, hypertension, hyperlipidemia, tobacco abuse, cardiothoracic, persistent atrial fibrillation on warfarin presenting with 1 day history of shortness of breath and worsening cough.  The patient states that he began having worsening shortness of breath and wheezing on the evening of 07/30/2024.  He has a largely nonproductive cough.  He denies any fevers, chills, chest pain, abdominal pain, nausea, vomiting, diarrhea, hematochezia, melena.  There is no dysuria or hematuria.  He continues to smoke 1 pack/day.  He has a 60-pack-year history.  He denies any worsening lower extremity edema or orthopnea or increasing abdominal girth.  He states that he has lost a few pounds in the past few months.  His last weight was 138 pounds about a week prior to this admission. In the ED, the patient was afebrile and hemodynamically stable with oxygen saturation of 86% on room air.  He was placed on 3 L with saturation of 95%.  WBC 9.1, hemoglobin 13.2, platelet 175.  Sodium 136, potassium 4.0, bicarbonate 21, serum creatinine 1.02.  AST 30, ALT 20, alk phosphatase 109, total bilirubin 1.1.  Albumin 4.0.  Chest x-ray showed chronic interstitial markings.  Patient was started on bronchodilators.  He was given a dose of furosemide  in the emergency department.  The patient was given Solu-Medrol  by EMS prior to arrival.   Assessment/Plan: Acute respiratory failure with hypoxia - Secondary to influenza and COPD exacerbation - Stable on 2-3 L - Wean oxygen as tolerated for saturation greater 92%   COPD exacerbation - Start Brovana  - Start Pulmicort  - Continue IV Solu-Medrol  - Continue DuoNebs - add yupelri    Persistent atrial fibrillation - Continue metoprolol  succinate - Continue warfarin   Coronary artery disease - No chest  pain presently -- 03/27/22 cath as reported below, PCI to SVG-OM1  - History of MI 1997 status post CABG - Continue metoprolol  succinate and statin   Chronic HFimpEF 02/2022 echo LVEF 40-45%, mild RV dysfunction, mild to mod MR - 06/2022 echo: LVEF 45-50% - 05/2023 echo: LVEF 50%, no WMAs, indet diastolic. Normal RV, mild MR   GERD - Continue PPI   Melanoma - Patient follows dermatology        Family Communication:   Family at bedside 12/26  Consultants:  none  Code Status:  FULL   DVT Prophylaxis:  warfarin   Procedures: As Listed in Progress Note Above  Antibiotics: none     Subjective: Pt states he is breathing a little better.  He still has dyspnea on exertion.  Denies f/c, cp, abd pain, n/v/d  Objective: Vitals:   07/31/24 2336 08/01/24 0456 08/01/24 0926 08/01/24 1343  BP: 101/64 120/78 113/78 113/66  Pulse: 86 99 (!) 106 89  Resp: 18 18 18 17   Temp: 97.9 F (36.6 C) 97.6 F (36.4 C)  98.7 F (37.1 C)  TempSrc: Oral Oral  Oral  SpO2: 93% 94%  96%  Weight:      Height:        Intake/Output Summary (Last 24 hours) at 08/01/2024 1804 Last data filed at 08/01/2024 1009 Gross per 24 hour  Intake 480 ml  Output --  Net 480 ml   Weight change:  Exam:  General:  Pt is alert, follows commands appropriately, not in acute distress HEENT: No icterus, No  thrush, No neck mass, [place]/AT Cardiovascular: RRR, S1/S2, no rubs, no gallops Respiratory: bilateral rales.  Bilateral wheeze Abdomen: Soft/+BS, non tender, non distended, no guarding Extremities: No edema, No lymphangitis, No petechiae, No rashes, no synovitis   Data Reviewed: I have personally reviewed following labs and imaging studies Basic Metabolic Panel: Recent Labs  Lab 07/31/24 1244 08/01/24 0415  NA 136 135  K 4.0 3.7  CL 99 96*  CO2 21* 28  GLUCOSE 91 164*  BUN 14 26*  CREATININE 1.02 1.32*  CALCIUM  9.2 9.2  MG  --  1.9   Liver Function Tests: Recent Labs  Lab  07/31/24 1244  AST 30  ALT 20  ALKPHOS 109  BILITOT 1.1  PROT 7.0  ALBUMIN 4.0   No results for input(s): LIPASE, AMYLASE in the last 168 hours. No results for input(s): AMMONIA in the last 168 hours. Coagulation Profile: Recent Labs  Lab 07/31/24 1304 08/01/24 0415  INR 1.8* 1.7*   CBC: Recent Labs  Lab 07/31/24 1244 08/01/24 0415  WBC 9.1 10.3  NEUTROABS 6.3  --   HGB 13.2 13.1  HCT 39.9 38.8*  MCV 99.0 96.8  PLT 176 177   Cardiac Enzymes: No results for input(s): CKTOTAL, CKMB, CKMBINDEX, TROPONINI in the last 168 hours. BNP: Invalid input(s): POCBNP CBG: No results for input(s): GLUCAP in the last 168 hours. HbA1C: No results for input(s): HGBA1C in the last 72 hours. Urine analysis:    Component Value Date/Time   COLORURINE YELLOW 05/14/2023 1715   APPEARANCEUR CLEAR 05/14/2023 1715   APPEARANCEUR Clear 11/30/2022 1353   LABSPEC 1.009 05/14/2023 1715   LABSPEC 1.005 12/29/2008 1608   PHURINE 6.0 05/14/2023 1715   GLUCOSEU NEGATIVE 05/14/2023 1715   HGBUR NEGATIVE 05/14/2023 1715   BILIRUBINUR NEGATIVE 05/14/2023 1715   BILIRUBINUR Negative 11/30/2022 1353   BILIRUBINUR Negative 12/29/2008 1608   KETONESUR NEGATIVE 05/14/2023 1715   PROTEINUR 30 (A) 05/14/2023 1715   UROBILINOGEN 0.2 11/26/2012 1422   NITRITE NEGATIVE 05/14/2023 1715   LEUKOCYTESUR NEGATIVE 05/14/2023 1715   LEUKOCYTESUR Negative 12/29/2008 1608   Sepsis Labs: @LABRCNTIP (procalcitonin:4,lacticidven:4) ) Recent Results (from the past 240 hours)  Resp panel by RT-PCR (RSV, Flu A&B, Covid) Anterior Nasal Swab     Status: Abnormal   Collection Time: 07/31/24 12:47 PM   Specimen: Anterior Nasal Swab  Result Value Ref Range Status   SARS Coronavirus 2 by RT PCR NEGATIVE NEGATIVE Final    Comment: (NOTE) SARS-CoV-2 target nucleic acids are NOT DETECTED.  The SARS-CoV-2 RNA is generally detectable in upper respiratory specimens during the acute phase of  infection. The lowest concentration of SARS-CoV-2 viral copies this assay can detect is 138 copies/mL. A negative result does not preclude SARS-Cov-2 infection and should not be used as the sole basis for treatment or other patient management decisions. A negative result may occur with  improper specimen collection/handling, submission of specimen other than nasopharyngeal swab, presence of viral mutation(s) within the areas targeted by this assay, and inadequate number of viral copies(<138 copies/mL). A negative result must be combined with clinical observations, patient history, and epidemiological information. The expected result is Negative.  Fact Sheet for Patients:  bloggercourse.com  Fact Sheet for Healthcare Providers:  seriousbroker.it  This test is no t yet approved or cleared by the [place]  FDA and  has been authorized for detection and/or diagnosis of SARS-CoV-2 by FDA under an Emergency Use Authorization (EUA). This EUA will remain  in effect (meaning this test can  be used) for the duration of the COVID-19 declaration under Section 564(b)(1) of the Act, 21 U.S.C.section 360bbb-3(b)(1), unless the authorization is terminated  or revoked sooner.       Influenza A by PCR POSITIVE (A) NEGATIVE Final   Influenza B by PCR NEGATIVE NEGATIVE Final    Comment: (NOTE) The Xpert Xpress SARS-CoV-2/FLU/RSV plus assay is intended as an aid in the diagnosis of influenza from Nasopharyngeal swab specimens and should not be used as a sole basis for treatment. Nasal washings and aspirates are unacceptable for Xpert Xpress SARS-CoV-2/FLU/RSV testing.  Fact Sheet for Patients: bloggercourse.com  Fact Sheet for Healthcare Providers: seriousbroker.it  This test is not yet approved or cleared by the [place]  FDA and has been authorized for detection and/or diagnosis of  SARS-CoV-2 by FDA under an Emergency Use Authorization (EUA). This EUA will remain in effect (meaning this test can be used) for the duration of the COVID-19 declaration under Section 564(b)(1) of the Act, 21 U.S.C. section 360bbb-3(b)(1), unless the authorization is terminated or revoked.     Resp Syncytial Virus by PCR NEGATIVE NEGATIVE Final    Comment: (NOTE) Fact Sheet for Patients: bloggercourse.com  Fact Sheet for Healthcare Providers: seriousbroker.it  This test is not yet approved or cleared by the [place]  FDA and has been authorized for detection and/or diagnosis of SARS-CoV-2 by FDA under an Emergency Use Authorization (EUA). This EUA will remain in effect (meaning this test can be used) for the duration of the COVID-19 declaration under Section 564(b)(1) of the Act, 21 U.S.C. section 360bbb-3(b)(1), unless the authorization is terminated or revoked.  Performed at [facility], [address], [place], [place] [address]      Scheduled Meds:  allopurinol   100 mg Oral Daily   arformoterol   15 mcg Nebulization BID   atorvastatin   80 mg Oral Daily   budesonide  (PULMICORT ) nebulizer solution  0.5 mg Nebulization BID   empagliflozin   10 mg Oral QAC breakfast   ipratropium-albuterol   3 mL Nebulization TID   methylPREDNISolone  (SOLU-MEDROL ) injection  60 mg Intravenous Q12H   metoprolol  succinate  75 mg Oral Daily   pantoprazole   40 mg Oral Daily   spironolactone   12.5 mg Oral BID   Warfarin - Pharmacist Dosing Inpatient   Does not apply q1600   Continuous Infusions:  Procedures/Studies: DG Chest Port 1 View Result Date: 07/31/2024 CLINICAL DATA:  Weakness and shortness of breath. EXAM: PORTABLE CHEST 1 VIEW COMPARISON:  May 14, 2023 FINDINGS: Multiple sternal wires and vascular clips are seen. The heart size and mediastinal contours are within normal limits. Mild, diffuse, chronic appearing increased  interstitial lung markings are noted. No focal consolidation, pleural effusion or pneumothorax is identified. Postoperative changes are seen involving the lower cervical spine. Chronic right rib fractures are present. No acute osseous abnormalities are identified. IMPRESSION: 1. Evidence of prior median sternotomy/CABG. 2. No acute or active cardiopulmonary disease. Electronically Signed   By: Suzen Dials M.D.   On: 07/31/2024 13:16    Alm Schneider, DO  [facility]  If 7PM-7AM, please contact night-coverage [email or web address] Password [email or web address] 08/01/2024, 6:04 PM   LOS: 1 day   "

## 2024-08-01 NOTE — Progress Notes (Signed)
" °   08/01/24 1127  TOC Brief Assessment  Insurance and Status Reviewed  Patient has primary care physician Yes  Home environment has been reviewed Home  Prior level of function: independent  Prior/Current Home Services No current home services  Social Drivers of Health Review SDOH reviewed no interventions necessary  Readmission risk has been reviewed Yes  Transition of care needs no transition of care needs at this time   Inpatient Care Manager (ICM) has reviewed patient and no ICM needs have been identified at this time. We will continue to monitor patient advancement through interdisciplinary progression rounds. If new patient transition needs arise, please place a ICM consult.   "

## 2024-08-01 NOTE — Progress Notes (Signed)
 Telemetry called and stated patient had 14 beat run on nonsustained vtach. Patient VS obtained and WNL. Pulse is elevated, patient recently had nebulizer treatments. Denies pain or SOB. Patient resting with wife at bedside. Erminio Cone, NP notified through secure chat. No new treatment orders given at this time.

## 2024-08-01 NOTE — Plan of Care (Signed)
   Problem: Education: Goal: Knowledge of General Education information will improve Description: Including pain rating scale, medication(s)/side effects and non-pharmacologic comfort measures Outcome: Progressing   Problem: Clinical Measurements: Goal: Ability to maintain clinical measurements within normal limits will improve Outcome: Progressing Goal: Diagnostic test results will improve Outcome: Progressing

## 2024-08-01 NOTE — Progress Notes (Signed)
" °   08/01/24 2128  Assess: MEWS Score  BP 121/68  MAP (mmHg) 76  Pulse Rate (!) 118  Resp 18  SpO2 93 %  O2 Device Nasal Cannula  O2 Flow Rate (L/min) 4 L/min  Assess: MEWS Score  MEWS Temp 0  MEWS Systolic 0  MEWS Pulse 2  MEWS RR 0  MEWS LOC 0  MEWS Score 2  MEWS Score Color Yellow  Assess: if the MEWS score is Yellow or Red  Were vital signs accurate and taken at a resting state? Yes  Does the patient meet 2 or more of the SIRS criteria? No  MEWS guidelines implemented  Yes, yellow  Treat  MEWS Interventions Considered administering scheduled or prn medications/treatments as ordered  Take Vital Signs  Increase Vital Sign Frequency  Yellow: Q2hr x1, continue Q4hrs until patient remains green for 12hrs  Escalate  MEWS: Escalate Yellow: Discuss with charge nurse and consider notifying provider and/or RRT  Notify: Charge Nurse/RN  Name of Charge Nurse/RN Notified Nurse is charge  Assess: SIRS CRITERIA  SIRS Temperature  0  SIRS Respirations  0  SIRS Pulse 1  SIRS WBC 0  SIRS Score Sum  1    "

## 2024-08-01 NOTE — Progress Notes (Signed)
 PHARMACY - ANTICOAGULATION CONSULT NOTE  Pharmacy Consult for warfarin Indication: atrial fibrillation  Allergies[1]  Patient Measurements: Height: 5' 6 (167.6 cm) Weight: 65.7 kg (144 lb 13.5 oz) IBW/kg (Calculated) : 63.8 HEPARIN  DW (KG): 65.7  Vital Signs: Temp: 97.6 F (36.4 C) (12/26 0456) Temp Source: Oral (12/26 0456) BP: 113/78 (12/26 0926) Pulse Rate: 106 (12/26 0926)  Labs: Recent Labs    07/31/24 1244 07/31/24 1304 08/01/24 0415  HGB 13.2  --  13.1  HCT 39.9  --  38.8*  PLT 176  --  177  LABPROT  --  21.8* 20.6*  INR  --  1.8* 1.7*  CREATININE 1.02  --  1.32*    Estimated Creatinine Clearance: 43 mL/min (A) (by C-G formula based on SCr of 1.32 mg/dL (H)).   Medical History: Past Medical History:  Diagnosis Date   Anginal pain    Atrial fibrillation by electrocardiogram (HCC) 11/2019   Atrial flutter by electrocardiogram [facility]) 11/2019   Bladder cancer (HCC) 01/2020   Bladder tumor    BPH (benign prostatic hyperplasia)    CAD (coronary artery disease)    a. s/p INF MI 1997;  b. s/p CABG 2001;  c. LHC 11/2009:  3v CAD, S-PDA ok with 40% mid, S-OM ok, S-Dx ok, L-LAD ok, EF 50%;  d.  Lex MV 5/14:  Inferolateral scar, EF 46%, no ischemia   COPD (chronic obstructive pulmonary disease) (HCC)    Dyspnea    ETOH abuse    GERD (gastroesophageal reflux disease)    History of gout    Hypercholesterolemia    Hypertension    Melanoma (HCC)    Melanoma in situ (HCC) 05/21/2024   right lower back-- Tx Mohs Dr. Corey 07/01/2024   Myocardial infarction [facility]) 2013   Prostate cancer [facility])    Tobacco abuse     Assessment: 78 year old male on warfarin prior to admit for afib. INR 1.8 on admit this afternoon. Patient takes 5mg  on Tuesdays, Thursdays, and Saturdays, 2.5mg  all other days.   INR down this morning to 1.7, no bleeding issues noted. CBC stable. Will give boosted dose tonight.   Goal of Therapy:  INR 2-3 Monitor platelets by anticoagulation protocol:  Yes   Plan:  Warfarin 7.5mg  tonight Daily INR for now  Dempsey Blush PharmD., BCPS Clinical Pharmacist 08/01/2024 11:58 AM      [1] No Known Allergies

## 2024-08-02 DIAGNOSIS — J9601 Acute respiratory failure with hypoxia: Secondary | ICD-10-CM | POA: Diagnosis not present

## 2024-08-02 DIAGNOSIS — I5022 Chronic systolic (congestive) heart failure: Secondary | ICD-10-CM | POA: Diagnosis not present

## 2024-08-02 DIAGNOSIS — J441 Chronic obstructive pulmonary disease with (acute) exacerbation: Secondary | ICD-10-CM | POA: Diagnosis not present

## 2024-08-02 DIAGNOSIS — I4819 Other persistent atrial fibrillation: Secondary | ICD-10-CM | POA: Diagnosis not present

## 2024-08-02 LAB — BASIC METABOLIC PANEL WITH GFR
Anion gap: 14 (ref 5–15)
BUN: 34 mg/dL — ABNORMAL HIGH (ref 8–23)
CO2: 25 mmol/L (ref 22–32)
Calcium: 9.4 mg/dL (ref 8.9–10.3)
Chloride: 97 mmol/L — ABNORMAL LOW (ref 98–111)
Creatinine, Ser: 1.16 mg/dL (ref 0.61–1.24)
GFR, Estimated: >60 mL/min
Glucose, Bld: 146 mg/dL — ABNORMAL HIGH (ref 70–99)
Potassium: 3.4 mmol/L — ABNORMAL LOW (ref 3.5–5.1)
Sodium: 136 mmol/L (ref 135–145)

## 2024-08-02 LAB — PROTIME-INR
INR: 2.8 — ABNORMAL HIGH (ref 0.8–1.2)
Prothrombin Time: 30.5 s — ABNORMAL HIGH (ref 11.4–15.2)

## 2024-08-02 LAB — MAGNESIUM: Magnesium: 2.3 mg/dL (ref 1.7–2.4)

## 2024-08-02 MED ORDER — POTASSIUM CHLORIDE CRYS ER 20 MEQ PO TBCR
40.0000 meq | EXTENDED_RELEASE_TABLET | Freq: Once | ORAL | Status: AC
  Administered 2024-08-02: 40 meq via ORAL
  Filled 2024-08-02: qty 2

## 2024-08-02 NOTE — Progress Notes (Signed)
 PHARMACY - ANTICOAGULATION CONSULT NOTE  Pharmacy Consult for warfarin Indication: atrial fibrillation  Allergies[1]  Patient Measurements: Height: 5' 6 (167.6 cm) Weight: 65.7 kg (144 lb 13.5 oz) IBW/kg (Calculated) : 63.8 HEPARIN  DW (KG): 65.7  Vital Signs: Temp: 97.3 F (36.3 C) (12/27 0923) Temp Source: Oral (12/27 0923) BP: 119/90 (12/27 0923) Pulse Rate: 105 (12/27 0923)  Labs: Recent Labs    07/31/24 1244 07/31/24 1304 08/01/24 0415 08/02/24 0352  HGB 13.2  --  13.1  --   HCT 39.9  --  38.8*  --   PLT 176  --  177  --   LABPROT  --  21.8* 20.6* 30.5*  INR  --  1.8* 1.7* 2.8*  CREATININE 1.02  --  1.32* 1.16    Estimated Creatinine Clearance: 48.9 mL/min (by C-G formula based on SCr of 1.16 mg/dL).   Medical History: Past Medical History:  Diagnosis Date   Anginal pain    Atrial fibrillation by electrocardiogram (HCC) 11/2019   Atrial flutter by electrocardiogram [facility]) 11/2019   Bladder cancer (HCC) 01/2020   Bladder tumor    BPH (benign prostatic hyperplasia)    CAD (coronary artery disease)    a. s/p INF MI 1997;  b. s/p CABG 2001;  c. LHC 11/2009:  3v CAD, S-PDA ok with 40% mid, S-OM ok, S-Dx ok, L-LAD ok, EF 50%;  d.  Lex MV 5/14:  Inferolateral scar, EF 46%, no ischemia   COPD (chronic obstructive pulmonary disease) (HCC)    Dyspnea    ETOH abuse    GERD (gastroesophageal reflux disease)    History of gout    Hypercholesterolemia    Hypertension    Melanoma (HCC)    Melanoma in situ (HCC) 05/21/2024   right lower back-- Tx Mohs Dr. Corey 07/01/2024   Myocardial infarction [facility]) 2013   Prostate cancer [facility])    Tobacco abuse     Assessment: 78 year old male on warfarin prior to admit for afib. INR 1.8 on admit this afternoon. Patient takes 5mg  on Tuesdays, Thursdays, and Saturdays, 2.5mg  all other days.   INR up to 2.8 this morning , no bleeding issues noted. CBC stable. Will hold dose tonight given rapid rise in INR.   Goal of Therapy:   INR 2-3 Monitor platelets by anticoagulation protocol: Yes   Plan:  Hold warfarin tonight Daily INR for now  Dempsey Blush PharmD., BCPS Clinical Pharmacist 08/02/2024 10:54 AM       [1] No Known Allergies

## 2024-08-02 NOTE — Plan of Care (Signed)

## 2024-08-02 NOTE — Plan of Care (Signed)
   Problem: Education: Goal: Knowledge of General Education information will improve Description: Including pain rating scale, medication(s)/side effects and non-pharmacologic comfort measures Outcome: Progressing   Problem: Clinical Measurements: Goal: Ability to maintain clinical measurements within normal limits will improve Outcome: Progressing Goal: Respiratory complications will improve Outcome: Progressing   Problem: Activity: Goal: Risk for activity intolerance will decrease Outcome: Progressing   Problem: Nutrition: Goal: Adequate nutrition will be maintained Outcome: Progressing

## 2024-08-02 NOTE — Progress Notes (Signed)
 "          PROGRESS NOTE  [name] FMW:[id number] DOB: 1945/10/13 DOA: 07/31/2024 PCP: Zollie Lowers, MD  Brief History:  24 male with a history of COPD, coronary disease status post MI, hypertension, hyperlipidemia, tobacco abuse, cardiothoracic, persistent atrial fibrillation on warfarin presenting with 1 day history of shortness of breath and worsening cough.  The patient states that he began having worsening shortness of breath and wheezing on the evening of 07/30/2024.  He has a largely nonproductive cough.  He denies any fevers, chills, chest pain, abdominal pain, nausea, vomiting, diarrhea, hematochezia, melena.  There is no dysuria or hematuria.  He continues to smoke 1 pack/day.  He has a 60-pack-year history.  He denies any worsening lower extremity edema or orthopnea or increasing abdominal girth.  He states that he has lost a few pounds in the past few months.  His last weight was 138 pounds about a week prior to this admission. In the ED, the patient was afebrile and hemodynamically stable with oxygen saturation of 86% on room air.  He was placed on 3 L with saturation of 95%.  WBC 9.1, hemoglobin 13.2, platelet 175.  Sodium 136, potassium 4.0, bicarbonate 21, serum creatinine 1.02.  AST 30, ALT 20, alk phosphatase 109, total bilirubin 1.1.  Albumin 4.0.  Chest x-ray showed chronic interstitial markings.  Patient was started on bronchodilators.  He was given a dose of furosemide  in the emergency department.  The patient was given Solu-Medrol  by EMS prior to arrival.   Assessment/Plan:  Acute respiratory failure with hypoxia - Secondary to influenza and COPD exacerbation - Stable on 2-3 L - Wean oxygen as tolerated for saturation greater 92%   COPD exacerbation - Start Brovana  - Start Pulmicort  - Continue IV Solu-Medrol  - Continue DuoNebs - added yupelri    Persistent atrial fibrillation - Continue metoprolol  succinate - Continue warfarin   Coronary artery disease - No  chest pain presently -- 03/27/22 cath as reported below, PCI to [id number]  - History of MI 1997 status post CABG - Continue metoprolol  succinate and statin   Chronic HFimpEF 02/2022 echo LVEF 40-45%, mild RV dysfunction, mild to mod MR - 06/2022 echo: LVEF 45-50% - 05/2023 echo: LVEF 50%, no WMAs, indet diastolic. Normal RV, mild MR -clinically compensated - continue Jardiance , spiro and metoprolol  succinate   GERD - Continue PPI   Melanoma - Patient follows dermatology   Hypokalemia -replete -check mag             Family Communication:   Family at bedside 12/27   Consultants:  none   Code Status:  FULL    DVT Prophylaxis:  warfarin     Procedures: As Listed in Progress Note Above   Antibiotics: none         Subjective: Pt states he is breathing better slowly, but remains sob walking to BR.  Has dry cough.  Denies  f/c, cp, n/v/d, abd pain  Objective: Vitals:   08/02/24 0412 08/02/24 0923 08/02/24 1137 08/02/24 1353  BP:  (!) 119/90  117/67  Pulse:  (!) 105  98  Resp:      Temp:  (!) 97.3 F (36.3 C)  98.1 F (36.7 C)  TempSrc:  Oral  Oral  SpO2: 94% 93% 95% 95%  Weight:      Height:        Intake/Output Summary (Last 24 hours) at 08/02/2024 1710 Last data filed at 08/02/2024 1354 Gross per 24 hour  Intake 720 ml  Output --  Net 720 ml   Weight change:  Exam:  General:  Pt is alert, follows commands appropriately, not in acute distress HEENT: No icterus, No thrush, No neck mass, [place]/AT Cardiovascular: RRR, S1/S2, no rubs, no gallops Respiratory: bibasilar wheeze.  Bibasilar crackles Abdomen: Soft/+BS, non tender, non distended, no guarding Extremities: No edema, No lymphangitis, No petechiae, No rashes, no synovitis   Data Reviewed: I have personally reviewed following labs and imaging studies Basic Metabolic Panel: Recent Labs  Lab 07/31/24 1244 08/01/24 0415 08/02/24 0352  NA 136 135 136  K 4.0 3.7 3.4*  CL 99 96* 97*  CO2  21* 28 25  GLUCOSE 91 164* 146*  BUN 14 26* 34*  CREATININE 1.02 1.32* 1.16  CALCIUM  9.2 9.2 9.4  MG  --  1.9 2.3   Liver Function Tests: Recent Labs  Lab 07/31/24 1244  AST 30  ALT 20  ALKPHOS 109  BILITOT 1.1  PROT 7.0  ALBUMIN 4.0   No results for input(s): LIPASE, AMYLASE in the last 168 hours. No results for input(s): AMMONIA in the last 168 hours. Coagulation Profile: Recent Labs  Lab 07/31/24 1304 08/01/24 0415 08/02/24 0352  INR 1.8* 1.7* 2.8*   CBC: Recent Labs  Lab 07/31/24 1244 08/01/24 0415  WBC 9.1 10.3  NEUTROABS 6.3  --   HGB 13.2 13.1  HCT 39.9 38.8*  MCV 99.0 96.8  PLT 176 177   Cardiac Enzymes: No results for input(s): CKTOTAL, CKMB, CKMBINDEX, TROPONINI in the last 168 hours. BNP: Invalid input(s): POCBNP CBG: No results for input(s): GLUCAP in the last 168 hours. HbA1C: No results for input(s): HGBA1C in the last 72 hours. Urine analysis:    Component Value Date/Time   COLORURINE YELLOW 05/14/2023 1715   APPEARANCEUR CLEAR 05/14/2023 1715   APPEARANCEUR Clear 11/30/2022 1353   LABSPEC 1.009 05/14/2023 1715   LABSPEC 1.005 12/29/2008 1608   PHURINE 6.0 05/14/2023 1715   GLUCOSEU NEGATIVE 05/14/2023 1715   HGBUR NEGATIVE 05/14/2023 1715   BILIRUBINUR NEGATIVE 05/14/2023 1715   BILIRUBINUR Negative 11/30/2022 1353   BILIRUBINUR Negative 12/29/2008 1608   KETONESUR NEGATIVE 05/14/2023 1715   PROTEINUR 30 (A) 05/14/2023 1715   UROBILINOGEN 0.2 11/26/2012 1422   NITRITE NEGATIVE 05/14/2023 1715   LEUKOCYTESUR NEGATIVE 05/14/2023 1715   LEUKOCYTESUR Negative 12/29/2008 1608   Sepsis Labs: @LABRCNTIP (procalcitonin:4,lacticidven:4) ) Recent Results (from the past 240 hours)  Resp panel by RT-PCR (RSV, Flu A&B, Covid) Anterior Nasal Swab     Status: Abnormal   Collection Time: 07/31/24 12:47 PM   Specimen: Anterior Nasal Swab  Result Value Ref Range Status   SARS Coronavirus 2 by RT PCR NEGATIVE NEGATIVE Final     Comment: (NOTE) SARS-CoV-2 target nucleic acids are NOT DETECTED.  The SARS-CoV-2 RNA is generally detectable in upper respiratory specimens during the acute phase of infection. The lowest concentration of SARS-CoV-2 viral copies this assay can detect is 138 copies/mL. A negative result does not preclude SARS-Cov-2 infection and should not be used as the sole basis for treatment or other patient management decisions. A negative result may occur with  improper specimen collection/handling, submission of specimen other than nasopharyngeal swab, presence of viral mutation(s) within the areas targeted by this assay, and inadequate number of viral copies(<138 copies/mL). A negative result must be combined with clinical observations, patient history, and epidemiological information. The expected result is Negative.  Fact Sheet for Patients:  bloggercourse.com  Fact Sheet for Healthcare Providers:  seriousbroker.it  This test is no t yet approved or cleared by the [place]  FDA and  has been authorized for detection and/or diagnosis of SARS-CoV-2 by FDA under an Emergency Use Authorization (EUA). This EUA will remain  in effect (meaning this test can be used) for the duration of the COVID-19 declaration under Section 564(b)(1) of the Act, 21 U.S.C.section 360bbb-3(b)(1), unless the authorization is terminated  or revoked sooner.       Influenza A by PCR POSITIVE (A) NEGATIVE Final   Influenza B by PCR NEGATIVE NEGATIVE Final    Comment: (NOTE) The Xpert Xpress SARS-CoV-2/FLU/RSV plus assay is intended as an aid in the diagnosis of influenza from Nasopharyngeal swab specimens and should not be used as a sole basis for treatment. Nasal washings and aspirates are unacceptable for Xpert Xpress SARS-CoV-2/FLU/RSV testing.  Fact Sheet for Patients: bloggercourse.com  Fact Sheet for Healthcare  Providers: seriousbroker.it  This test is not yet approved or cleared by the [place]  FDA and has been authorized for detection and/or diagnosis of SARS-CoV-2 by FDA under an Emergency Use Authorization (EUA). This EUA will remain in effect (meaning this test can be used) for the duration of the COVID-19 declaration under Section 564(b)(1) of the Act, 21 U.S.C. section 360bbb-3(b)(1), unless the authorization is terminated or revoked.     Resp Syncytial Virus by PCR NEGATIVE NEGATIVE Final    Comment: (NOTE) Fact Sheet for Patients: bloggercourse.com  Fact Sheet for Healthcare Providers: seriousbroker.it  This test is not yet approved or cleared by the [place]  FDA and has been authorized for detection and/or diagnosis of SARS-CoV-2 by FDA under an Emergency Use Authorization (EUA). This EUA will remain in effect (meaning this test can be used) for the duration of the COVID-19 declaration under Section 564(b)(1) of the Act, 21 U.S.C. section 360bbb-3(b)(1), unless the authorization is terminated or revoked.  Performed at [facility], [address], [place],  [address]      Scheduled Meds:  albuterol   2.5 mg Nebulization Q6H   allopurinol   100 mg Oral Daily   arformoterol   15 mcg Nebulization BID   atorvastatin   80 mg Oral Daily   budesonide  (PULMICORT ) nebulizer solution  0.5 mg Nebulization BID   empagliflozin   10 mg Oral QAC breakfast   methylPREDNISolone  (SOLU-MEDROL ) injection  60 mg Intravenous Q12H   metoprolol  succinate  75 mg Oral Daily   oseltamivir   30 mg Oral BID   pantoprazole   40 mg Oral Daily   revefenacin   175 mcg Nebulization Daily   spironolactone   12.5 mg Oral BID   Warfarin - Pharmacist Dosing Inpatient   Does not apply q1600   Continuous Infusions:  Procedures/Studies: DG Chest Port 1 View Result Date: 07/31/2024 CLINICAL DATA:  Weakness and shortness of  breath. EXAM: PORTABLE CHEST 1 VIEW COMPARISON:  May 14, 2023 FINDINGS: Multiple sternal wires and vascular clips are seen. The heart size and mediastinal contours are within normal limits. Mild, diffuse, chronic appearing increased interstitial lung markings are noted. No focal consolidation, pleural effusion or pneumothorax is identified. Postoperative changes are seen involving the lower cervical spine. Chronic right rib fractures are present. No acute osseous abnormalities are identified. IMPRESSION: 1. Evidence of prior median sternotomy/CABG. 2. No acute or active cardiopulmonary disease. Electronically Signed   By: Suzen Dials M.D.   On: 07/31/2024 13:16    Alm Schneider, DO  [facility]  If 7PM-7AM, please contact night-coverage [email or web address] Password [email or web address] 08/02/2024, 5:10 PM   LOS:  2 days   "

## 2024-08-03 ENCOUNTER — Inpatient Hospital Stay (HOSPITAL_COMMUNITY)

## 2024-08-03 DIAGNOSIS — I4819 Other persistent atrial fibrillation: Secondary | ICD-10-CM | POA: Diagnosis not present

## 2024-08-03 DIAGNOSIS — I5023 Acute on chronic systolic (congestive) heart failure: Secondary | ICD-10-CM | POA: Diagnosis not present

## 2024-08-03 DIAGNOSIS — Z720 Tobacco use: Secondary | ICD-10-CM

## 2024-08-03 DIAGNOSIS — J9601 Acute respiratory failure with hypoxia: Secondary | ICD-10-CM | POA: Diagnosis not present

## 2024-08-03 DIAGNOSIS — J441 Chronic obstructive pulmonary disease with (acute) exacerbation: Secondary | ICD-10-CM | POA: Diagnosis not present

## 2024-08-03 LAB — MAGNESIUM: Magnesium: 2.2 mg/dL (ref 1.7–2.4)

## 2024-08-03 LAB — BASIC METABOLIC PANEL WITH GFR
Anion gap: 8 (ref 5–15)
BUN: 40 mg/dL — ABNORMAL HIGH (ref 8–23)
CO2: 28 mmol/L (ref 22–32)
Calcium: 8.8 mg/dL — ABNORMAL LOW (ref 8.9–10.3)
Chloride: 101 mmol/L (ref 98–111)
Creatinine, Ser: 1.09 mg/dL (ref 0.61–1.24)
GFR, Estimated: >60 mL/min
Glucose, Bld: 141 mg/dL — ABNORMAL HIGH (ref 70–99)
Potassium: 4.1 mmol/L (ref 3.5–5.1)
Sodium: 137 mmol/L (ref 135–145)

## 2024-08-03 LAB — CBC
HCT: 35.8 % — ABNORMAL LOW (ref 39.0–52.0)
Hemoglobin: 12 g/dL — ABNORMAL LOW (ref 13.0–17.0)
MCH: 32.5 pg (ref 26.0–34.0)
MCHC: 33.5 g/dL (ref 30.0–36.0)
MCV: 97 fL (ref 80.0–100.0)
Platelets: 182 K/uL (ref 150–400)
RBC: 3.69 MIL/uL — ABNORMAL LOW (ref 4.22–5.81)
RDW: 14.2 % (ref 11.5–15.5)
WBC: 11.3 K/uL — ABNORMAL HIGH (ref 4.0–10.5)
nRBC: 0 % (ref 0.0–0.2)

## 2024-08-03 MED ORDER — FUROSEMIDE 10 MG/ML IJ SOLN
40.0000 mg | Freq: Once | INTRAMUSCULAR | Status: AC
  Administered 2024-08-03: 40 mg via INTRAVENOUS
  Filled 2024-08-03: qty 4

## 2024-08-03 MED ORDER — GUAIFENESIN-DM 100-10 MG/5ML PO SYRP
15.0000 mL | ORAL_SOLUTION | Freq: Three times a day (TID) | ORAL | Status: DC | PRN
  Administered 2024-08-03 – 2024-08-04 (×2): 15 mL via ORAL
  Filled 2024-08-03: qty 15

## 2024-08-03 NOTE — Progress Notes (Signed)
 "          PROGRESS NOTE  [name] FMW:[id number] DOB: [birth year] DOA: 07/31/2024 PCP: Zollie Lowers, MD  Brief History:  70 male with a history of COPD, coronary disease status post MI, hypertension, hyperlipidemia, tobacco abuse, cardiothoracic, persistent atrial fibrillation on warfarin presenting with 1 day history of shortness of breath and worsening cough.  The patient states that he began having worsening shortness of breath and wheezing on the evening of 07/30/2024.  He has a largely nonproductive cough.  He denies any fevers, chills, chest pain, abdominal pain, nausea, vomiting, diarrhea, hematochezia, melena.  There is no dysuria or hematuria.  He continues to smoke 1 pack/day.  He has a 60-pack-year history.  He denies any worsening lower extremity edema or orthopnea or increasing abdominal girth.  He states that he has lost a few pounds in the past few months.  His last weight was 138 pounds about a week prior to this admission. In the ED, the patient was afebrile and hemodynamically stable with oxygen saturation of 86% on room air.  He was placed on 3 L with saturation of 95%.  WBC 9.1, hemoglobin 13.2, platelet 175.  Sodium 136, potassium 4.0, bicarbonate 21, serum creatinine 1.02.  AST 30, ALT 20, alk phosphatase 109, total bilirubin 1.1.  Albumin 4.0.  Chest x-ray showed chronic interstitial markings.  Patient was started on bronchodilators.  He was given a dose of furosemide  in the emergency department.  The patient was given Solu-Medrol  by EMS prior to arrival.   Assessment/Plan: Acute respiratory failure with hypoxia - Secondary to influenza and COPD exacerbation - Stable on 2-3 L - Wean oxygen as tolerated for saturation greater 92% - 12/28 repeat CXR--personally reviewed>>increased interstitial markings - give lasix  40 mg IV x 1   COPD exacerbation - Start Brovana  - Start Pulmicort  - Continue IV Solu-Medrol  - Continue DuoNebs - added yupelri   Supratherapeutic  INR -PharmD assisting with warfarin dosing -INR 5.2 -no signs of bleeding presently -am INR   Persistent atrial fibrillation - Continue metoprolol  succinate - Continue warfarin - rate controlled   Coronary artery disease - No chest pain presently -- 03/27/22 cath as reported below, PCI to SVG-OM1  - History of MI 1997 status post CABG - Continue metoprolol  succinate and statin   Chronic HFimpEF 02/2022 echo LVEF 40-45%, mild RV dysfunction, mild to mod MR - 06/2022 echo: LVEF 45-50% - 05/2023 echo: LVEF 50%, no WMAs, indet diastolic. Normal RV, mild MR -clinically compensated - continue Jardiance , spiro and metoprolol  succinate   GERD - Continue PPI   Melanoma - Patient follows dermatology   Hypokalemia -replete -check mag 2.2  Goals of Care -12/28--discussed with pt and wife--confirm DNR             Family Communication:   Family at bedside 12/27   Consultants:  none   Code Status:  DNR--confirmed with pt and wife   DVT Prophylaxis:  warfarin     Procedures: As Listed in Progress Note Above   Antibiotics: none           Subjective: Pt states sob not much better in last 24 hours.  Denies cp, hemoptysis, n/v/d, abd pain  Objective: Vitals:   08/03/24 0350 08/03/24 0739 08/03/24 1256 08/03/24 1348  BP: 125/71   118/71  Pulse: 95   93  Resp: 18     Temp: 98.2 F (36.8 C)   98 F (36.7 C)  TempSrc: Oral   Oral  SpO2: 94% 98% 95% 94%  Weight:      Height:        Intake/Output Summary (Last 24 hours) at 08/03/2024 1623 Last data filed at 08/03/2024 0900 Gross per 24 hour  Intake 240 ml  Output --  Net 240 ml   Weight change:  Exam:  General:  Pt is alert, follows commands appropriately, not in acute distress HEENT: No icterus, No thrush, No neck mass, Bowman/AT Cardiovascular: RRR, S1/S2, no rubs, no gallops Respiratory: bilateral rales.  Bilateral wheeze Abdomen: Soft/+BS, non tender, non distended, no guarding Extremities: No  edema, No lymphangitis, No petechiae, No rashes, no synovitis   Data Reviewed: I have personally reviewed following labs and imaging studies Basic Metabolic Panel: Recent Labs  Lab 07/31/24 1244 08/01/24 0415 08/02/24 0352 08/03/24 0348  NA 136 135 136 137  K 4.0 3.7 3.4* 4.1  CL 99 96* 97* 101  CO2 21* 28 25 28   GLUCOSE 91 164* 146* 141*  BUN 14 26* 34* 40*  CREATININE 1.02 1.32* 1.16 1.09  CALCIUM  9.2 9.2 9.4 8.8*  MG  --  1.9 2.3 2.2   Liver Function Tests: Recent Labs  Lab 07/31/24 1244  AST 30  ALT 20  ALKPHOS 109  BILITOT 1.1  PROT 7.0  ALBUMIN 4.0   No results for input(s): LIPASE, AMYLASE in the last 168 hours. No results for input(s): AMMONIA in the last 168 hours. Coagulation Profile: Recent Labs  Lab 07/31/24 1304 08/01/24 0415 08/02/24 0352 08/03/24 0348  INR 1.8* 1.7* 2.8* 5.2*   CBC: Recent Labs  Lab 07/31/24 1244 08/01/24 0415 08/03/24 0348  WBC 9.1 10.3 11.3*  NEUTROABS 6.3  --   --   HGB 13.2 13.1 12.0*  HCT 39.9 38.8* 35.8*  MCV 99.0 96.8 97.0  PLT 176 177 182   Cardiac Enzymes: No results for input(s): CKTOTAL, CKMB, CKMBINDEX, TROPONINI in the last 168 hours. BNP: Invalid input(s): POCBNP CBG: No results for input(s): GLUCAP in the last 168 hours. HbA1C: No results for input(s): HGBA1C in the last 72 hours. Urine analysis:    Component Value Date/Time   COLORURINE YELLOW 05/14/2023 1715   APPEARANCEUR CLEAR 05/14/2023 1715   APPEARANCEUR Clear 11/30/2022 1353   LABSPEC 1.009 05/14/2023 1715   LABSPEC 1.005 12/29/2008 1608   PHURINE 6.0 05/14/2023 1715   GLUCOSEU NEGATIVE 05/14/2023 1715   HGBUR NEGATIVE 05/14/2023 1715   BILIRUBINUR NEGATIVE 05/14/2023 1715   BILIRUBINUR Negative 11/30/2022 1353   BILIRUBINUR Negative 12/29/2008 1608   KETONESUR NEGATIVE 05/14/2023 1715   PROTEINUR 30 (A) 05/14/2023 1715   UROBILINOGEN 0.2 11/26/2012 1422   NITRITE NEGATIVE 05/14/2023 1715   LEUKOCYTESUR  NEGATIVE 05/14/2023 1715   LEUKOCYTESUR Negative 12/29/2008 1608   Sepsis Labs: @LABRCNTIP (procalcitonin:4,lacticidven:4) ) Recent Results (from the past 240 hours)  Resp panel by RT-PCR (RSV, Flu A&B, Covid) Anterior Nasal Swab     Status: Abnormal   Collection Time: 07/31/24 12:47 PM   Specimen: Anterior Nasal Swab  Result Value Ref Range Status   SARS Coronavirus 2 by RT PCR NEGATIVE NEGATIVE Final    Comment: (NOTE) SARS-CoV-2 target nucleic acids are NOT DETECTED.  The SARS-CoV-2 RNA is generally detectable in upper respiratory specimens during the acute phase of infection. The lowest concentration of SARS-CoV-2 viral copies this assay can detect is 138 copies/mL. A negative result does not preclude SARS-Cov-2 infection and should not be used as the sole basis for treatment or other patient management decisions. A negative result may  occur with  improper specimen collection/handling, submission of specimen other than nasopharyngeal swab, presence of viral mutation(s) within the areas targeted by this assay, and inadequate number of viral copies(<138 copies/mL). A negative result must be combined with clinical observations, patient history, and epidemiological information. The expected result is Negative.  Fact Sheet for Patients:  bloggercourse.com  Fact Sheet for Healthcare Providers:  seriousbroker.it  This test is no t yet approved or cleared by the [place]  FDA and  has been authorized for detection and/or diagnosis of SARS-CoV-2 by FDA under an Emergency Use Authorization (EUA). This EUA will remain  in effect (meaning this test can be used) for the duration of the COVID-19 declaration under Section 564(b)(1) of the Act, 21 U.S.C.section 360bbb-3(b)(1), unless the authorization is terminated  or revoked sooner.       Influenza A by PCR POSITIVE (A) NEGATIVE Final   Influenza B by PCR NEGATIVE NEGATIVE Final     Comment: (NOTE) The Xpert Xpress SARS-CoV-2/FLU/RSV plus assay is intended as an aid in the diagnosis of influenza from Nasopharyngeal swab specimens and should not be used as a sole basis for treatment. Nasal washings and aspirates are unacceptable for Xpert Xpress SARS-CoV-2/FLU/RSV testing.  Fact Sheet for Patients: bloggercourse.com  Fact Sheet for Healthcare Providers: seriousbroker.it  This test is not yet approved or cleared by the [place]  FDA and has been authorized for detection and/or diagnosis of SARS-CoV-2 by FDA under an Emergency Use Authorization (EUA). This EUA will remain in effect (meaning this test can be used) for the duration of the COVID-19 declaration under Section 564(b)(1) of the Act, 21 U.S.C. section 360bbb-3(b)(1), unless the authorization is terminated or revoked.     Resp Syncytial Virus by PCR NEGATIVE NEGATIVE Final    Comment: (NOTE) Fact Sheet for Patients: bloggercourse.com  Fact Sheet for Healthcare Providers: seriousbroker.it  This test is not yet approved or cleared by the [place]  FDA and has been authorized for detection and/or diagnosis of SARS-CoV-2 by FDA under an Emergency Use Authorization (EUA). This EUA will remain in effect (meaning this test can be used) for the duration of the COVID-19 declaration under Section 564(b)(1) of the Act, 21 U.S.C. section 360bbb-3(b)(1), unless the authorization is terminated or revoked.  Performed at [facility], [address], [place], [place] [address]      Scheduled Meds:  albuterol   2.5 mg Nebulization Q6H   allopurinol   100 mg Oral Daily   arformoterol   15 mcg Nebulization BID   atorvastatin   80 mg Oral Daily   budesonide  (PULMICORT ) nebulizer solution  0.5 mg Nebulization BID   empagliflozin   10 mg Oral QAC breakfast   methylPREDNISolone  (SOLU-MEDROL ) injection  60 mg  Intravenous Q12H   metoprolol  succinate  75 mg Oral Daily   oseltamivir   30 mg Oral BID   pantoprazole   40 mg Oral Daily   revefenacin   175 mcg Nebulization Daily   spironolactone   12.5 mg Oral BID   Warfarin - Pharmacist Dosing Inpatient   Does not apply q1600   Continuous Infusions:  Procedures/Studies: DG Chest Port 1 View Result Date: 07/31/2024 CLINICAL DATA:  Weakness and shortness of breath. EXAM: PORTABLE CHEST 1 VIEW COMPARISON:  May 14, 2023 FINDINGS: Multiple sternal wires and vascular clips are seen. The heart size and mediastinal contours are within normal limits. Mild, diffuse, chronic appearing increased interstitial lung markings are noted. No focal consolidation, pleural effusion or pneumothorax is identified. Postoperative changes are seen involving the lower cervical spine.  Chronic right rib fractures are present. No acute osseous abnormalities are identified. IMPRESSION: 1. Evidence of prior median sternotomy/CABG. 2. No acute or active cardiopulmonary disease. Electronically Signed   By: Suzen Dials M.D.   On: 07/31/2024 13:16    Alm Schneider, DO  [facility]  If 7PM-7AM, please contact night-coverage [email or web address] Password [email or web address] 08/03/2024, 4:23 PM   LOS: 3 days   "

## 2024-08-03 NOTE — Progress Notes (Signed)
 Called [facility] pharmacy and notifed pharmacist of INR of 5.2.  Received  no new orders at this time.  Patient has been stable with no complaints of pain.

## 2024-08-03 NOTE — Progress Notes (Signed)
 PHARMACY - ANTICOAGULATION CONSULT NOTE  Pharmacy Consult for warfarin Indication: atrial fibrillation  Allergies[1]  Patient Measurements: Height: 5' 6 (167.6 cm) Weight: 65.7 kg (144 lb 13.5 oz) IBW/kg (Calculated) : 63.8 HEPARIN  DW (KG): 65.7  Vital Signs: Temp: 98.2 F (36.8 C) (12/28 0350) Temp Source: Oral (12/28 0350) BP: 125/71 (12/28 0350) Pulse Rate: 95 (12/28 0350)  Labs: Recent Labs    07/31/24 1244 07/31/24 1304 08/01/24 0415 08/02/24 0352 08/03/24 0348  HGB 13.2  --  13.1  --  12.0*  HCT 39.9  --  38.8*  --  35.8*  PLT 176  --  177  --  182  LABPROT  --    < > 20.6* 30.5* 49.7*  INR  --    < > 1.7* 2.8* 5.2*  CREATININE 1.02  --  1.32* 1.16 1.09   < > = values in this interval not displayed.    Estimated Creatinine Clearance: 52 mL/min (by C-G formula based on SCr of 1.09 mg/dL).   Medical History: Past Medical History:  Diagnosis Date   Anginal pain    Atrial fibrillation by electrocardiogram (HCC) 11/2019   Atrial flutter by electrocardiogram [facility]) 11/2019   Bladder cancer (HCC) 01/2020   Bladder tumor    BPH (benign prostatic hyperplasia)    CAD (coronary artery disease)    a. s/p INF MI 1997;  b. s/p CABG 2001;  c. LHC 11/2009:  3v CAD, S-PDA ok with 40% mid, S-OM ok, S-Dx ok, L-LAD ok, EF 50%;  d.  Lex MV 5/14:  Inferolateral scar, EF 46%, no ischemia   COPD (chronic obstructive pulmonary disease) (HCC)    Dyspnea    ETOH abuse    GERD (gastroesophageal reflux disease)    History of gout    Hypercholesterolemia    Hypertension    Melanoma (HCC)    Melanoma in situ (HCC) 05/21/2024   right lower back-- Tx Mohs Dr. Corey 07/01/2024   Myocardial infarction [facility]) 2013   Prostate cancer [facility])    Tobacco abuse     Assessment: 78 year old male on warfarin prior to admit for afib. INR 1.8 on admit this afternoon. Patient takes 5mg  on Tuesdays, Thursdays, and Saturdays, 2.5mg  all other days.   INR up from 2.8>5.2 this morning , no bleeding  issues noted. CBC stable. Dose held 12/27 will hold again.  Goal of Therapy:  INR 2-3 Monitor platelets by anticoagulation protocol: Yes   Plan:  Hold warfarin tonight Daily INR for now  Dempsey Blush PharmD., BCPS Clinical Pharmacist 08/03/2024 10:08 AM        [1] No Known Allergies

## 2024-08-03 NOTE — Progress Notes (Signed)
 Has had audible wheezing and SOB on exertion on 3 liters acute.  Family has been at bedside and at one point patient decided on DNR status but then changed to DNI after discussion with family.  Has been ambulating in room and to bathroom with assist/standby.

## 2024-08-03 NOTE — Progress Notes (Signed)
 Date and time results received: 08/03/2024    Test: INR Critical Value: 5.2  Name of Provider Notified: Erminio Cone  Orders Received? Or Actions Taken?:  NP aware. Primary RN aware.   [place] RN

## 2024-08-03 NOTE — TOC Initial Note (Signed)
 Transition of Care [facility]) - Initial/Assessment Note    Patient Details  Name: [name] MRN: [id number] Date of Birth: 10/14/1945  Transition of Care [facility]) CM/SW Contact:    Lucie Lunger, LCSWA Phone Number: 08/03/2024, 12:43 PM  Clinical Narrative:                 Pt is high risk for readmission. CSW spoke with pts spouse to complete assessment. Pt is from home and is independent with completing his ADLs at baseline. Pt has a cane and walker but wife states he does not need to use them to ambulate. Pt does not drive but spouse is able to provide transport when needed. Pt has not had HH in the past. TOC to follow.   Expected Discharge Plan: Home/Self Care Barriers to Discharge: Continued Medical Work up   Patient Goals and CMS Choice Patient states their goals for this hospitalization and ongoing recovery are:: return home CMS Medicare.gov Compare Post Acute Care list provided to:: Patient Choice offered to / list presented to : Patient      Expected Discharge Plan and Services In-house Referral: Clinical Social Work Discharge Planning Services: CM Consult   Living arrangements for the past 2 months: Single Family Home                                      Prior Living Arrangements/Services Living arrangements for the past 2 months: Single Family Home Lives with:: Spouse Patient language and need for interpreter reviewed:: Yes Do you feel safe going back to the place where you live?: Yes      Need for Family Participation in Patient Care: Yes (Comment) Care giver support system in place?: Yes (comment)   Criminal Activity/Legal Involvement Pertinent to Current Situation/Hospitalization: No - Comment as needed  Activities of Daily Living   ADL Screening (condition at time of admission) Independently performs ADLs?: Yes (appropriate for developmental age) Is the patient deaf or have difficulty hearing?: No Does the patient have difficulty seeing, even when  wearing glasses/contacts?: No Does the patient have difficulty concentrating, remembering, or making decisions?: No  Permission Sought/Granted                  Emotional Assessment Appearance:: Appears stated age Attitude/Demeanor/Rapport: Engaged Affect (typically observed): Accepting Orientation: : Oriented to Self, Oriented to Place, Oriented to  Time, Oriented to Situation Alcohol / Substance Use: Not Applicable Psych Involvement: No (comment)  Admission diagnosis:  Gastroesophageal reflux disease without esophagitis [K21.9] COPD exacerbation (HCC) [J44.1] H/O: gout [Z87.39] Acute respiratory failure with hypoxia (HCC) [J96.01] Influenza [J11.1] Acute decompensated heart failure (HCC) [I50.9] Hyperlipidemia, unspecified hyperlipidemia type [E78.5] Chronic obstructive pulmonary disease, unspecified COPD type (HCC) [J44.9] Patient Active Problem List   Diagnosis Date Noted   COPD exacerbation (HCC) 08/01/2024   Acute respiratory failure with hypoxia (HCC) 07/31/2024   COPD with acute exacerbation (HCC) 07/31/2024   Ventricular tachycardia (HCC) 05/21/2023   Malnutrition of moderate degree 05/15/2023   Syncope 05/14/2023   Hypomagnesemia 05/14/2023   Hypoalbuminemia due to protein-calorie malnutrition 05/14/2023   Elevated brain natriuretic peptide (BNP) level 05/14/2023   Aortic atherosclerosis 04/24/2023   Physical deconditioning 12/07/2022   H/O: gout 05/30/2022   Vitamin D  deficiency 05/30/2022   Chronic heart failure with mildly reduced ejection fraction (HFmrEF) (HCC) 05/30/2022   Blood thinned due to long-term anticoagulant use 03/30/2022   Chest pain  03/25/2022    Class: Acute   Hypokalemia 03/25/2022   Elevated ferritin 10/24/2021   Lower abdominal pain 05/23/2021   Abnormal CT scan, colon 05/23/2021   Rectal bleeding 05/23/2021   Anemia 05/23/2021   Rectal burning 05/23/2021   Hemorrhoids 05/23/2021   Dysuria 05/23/2021   Allergic rhinitis 12/02/2020    Pulmonary nodules 09/07/2020   Persistent atrial fibrillation (HCC) 01/13/2020   Acute respiratory failure (HCC) 07/21/2016   Osteoarthritis of cervical spine with myelopathy and radiculopathy 07/21/2016   COPD (chronic obstructive pulmonary disease) (HCC) 04/17/2016   Radiculopathy affecting upper extremity 04/17/2016   Numbness 03/28/2016   Neuropathy 02/21/2016   Neck pain 10/30/2015   Essential hypertension 06/15/2015   GERD (gastroesophageal reflux disease) 11/26/2012   Gout 11/26/2012   CAD (coronary artery disease) 11/26/2012   Hyperkalemia 11/26/2012   Hyponatremia 11/26/2012   Dysphagia 05/02/2012   BRADYCARDIA 12/06/2009   Mixed hyperlipidemia 12/03/2009   Alcohol abuse 12/03/2009   Tobacco abuse 12/03/2009   PCP:  Zollie Lowers, MD Pharmacy:   [facility]) - [place], [place] - [address] [facility] [address] [place] [address] [place] [address] Phone: [phone] Fax: [phone]  [facility] [place], [place] - [address] [place] [address] Phone: [phone] Fax: [phone]  [facility] - [place], [place] - [address] [place] [address] Phone: [phone] Fax: [phone]     Social Drivers of Health (SDOH) Social History: SDOH Screenings   Food Insecurity: No Food Insecurity (07/31/2024)  Housing: Low Risk (07/31/2024)  Transportation Needs: No Transportation Needs (07/31/2024)  Utilities: Not At Risk (07/31/2024)  Alcohol Screen: Low Risk (12/05/2023)  Depression (PHQ2-9): Low Risk (04/24/2024)  Financial Resource Strain: Low Risk (12/05/2023)  Physical Activity: Unknown (12/05/2023)  Social Connections: Socially Isolated (07/31/2024)  Stress: No Stress Concern Present (12/05/2023)  Tobacco Use: High Risk (07/31/2024)  Health Literacy: Inadequate Health Literacy (12/05/2023)   SDOH Interventions:      Readmission Risk Interventions    08/03/2024   12:42 PM  Readmission Risk Prevention Plan  Transportation Screening Complete  HRI or Home Care Consult Complete  Social Work Consult for Recovery Care Planning/Counseling Complete  Palliative Care Screening Not Applicable  Medication Review Oceanographer) Complete

## 2024-08-04 ENCOUNTER — Telehealth (HOSPITAL_COMMUNITY): Payer: Self-pay

## 2024-08-04 ENCOUNTER — Other Ambulatory Visit (HOSPITAL_COMMUNITY): Payer: Self-pay

## 2024-08-04 ENCOUNTER — Inpatient Hospital Stay (HOSPITAL_COMMUNITY)

## 2024-08-04 DIAGNOSIS — J9601 Acute respiratory failure with hypoxia: Secondary | ICD-10-CM | POA: Diagnosis not present

## 2024-08-04 DIAGNOSIS — I4819 Other persistent atrial fibrillation: Secondary | ICD-10-CM | POA: Diagnosis not present

## 2024-08-04 DIAGNOSIS — I5023 Acute on chronic systolic (congestive) heart failure: Secondary | ICD-10-CM | POA: Diagnosis not present

## 2024-08-04 DIAGNOSIS — J441 Chronic obstructive pulmonary disease with (acute) exacerbation: Secondary | ICD-10-CM | POA: Diagnosis not present

## 2024-08-04 LAB — BASIC METABOLIC PANEL WITH GFR
Anion gap: 10 (ref 5–15)
BUN: 35 mg/dL — ABNORMAL HIGH (ref 8–23)
CO2: 27 mmol/L (ref 22–32)
Calcium: 8.6 mg/dL — ABNORMAL LOW (ref 8.9–10.3)
Chloride: 97 mmol/L — ABNORMAL LOW (ref 98–111)
Creatinine, Ser: 1.14 mg/dL (ref 0.61–1.24)
GFR, Estimated: >60 mL/min
Glucose, Bld: 186 mg/dL — ABNORMAL HIGH (ref 70–99)
Potassium: 3.9 mmol/L (ref 3.5–5.1)
Sodium: 135 mmol/L (ref 135–145)

## 2024-08-04 LAB — PROTIME-INR
INR: 4.3 (ref 0.8–1.2)
Prothrombin Time: 42.8 s — ABNORMAL HIGH (ref 11.4–15.2)

## 2024-08-04 LAB — CBC
HCT: 37.9 % — ABNORMAL LOW (ref 39.0–52.0)
Hemoglobin: 12.6 g/dL — ABNORMAL LOW (ref 13.0–17.0)
MCH: 32.1 pg (ref 26.0–34.0)
MCHC: 33.2 g/dL (ref 30.0–36.0)
MCV: 96.4 fL (ref 80.0–100.0)
Platelets: 179 K/uL (ref 150–400)
RBC: 3.93 MIL/uL — ABNORMAL LOW (ref 4.22–5.81)
RDW: 14.1 % (ref 11.5–15.5)
WBC: 8.3 K/uL (ref 4.0–10.5)
nRBC: 0 % (ref 0.0–0.2)

## 2024-08-04 LAB — MAGNESIUM: Magnesium: 2 mg/dL (ref 1.7–2.4)

## 2024-08-04 MED ORDER — HYDROCODONE BIT-HOMATROP MBR 5-1.5 MG/5ML PO SOLN
5.0000 mL | ORAL | Status: DC | PRN
  Administered 2024-08-04: 5 mL via ORAL
  Filled 2024-08-04: qty 5

## 2024-08-04 MED ORDER — DOXYCYCLINE HYCLATE 100 MG PO TABS
100.0000 mg | ORAL_TABLET | Freq: Two times a day (BID) | ORAL | Status: DC
  Administered 2024-08-04 – 2024-08-07 (×6): 100 mg via ORAL
  Filled 2024-08-04 (×6): qty 1

## 2024-08-04 MED ORDER — FUROSEMIDE 10 MG/ML IJ SOLN
40.0000 mg | Freq: Every day | INTRAMUSCULAR | Status: AC
  Administered 2024-08-04 – 2024-08-06 (×3): 40 mg via INTRAVENOUS
  Filled 2024-08-04 (×3): qty 4

## 2024-08-04 MED ORDER — MELATONIN 3 MG PO TABS
6.0000 mg | ORAL_TABLET | Freq: Every evening | ORAL | Status: DC | PRN
  Administered 2024-08-04: 6 mg via ORAL
  Filled 2024-08-04: qty 2

## 2024-08-04 NOTE — Progress Notes (Signed)
 PHARMACY - ANTICOAGULATION CONSULT NOTE  Pharmacy Consult for warfarin Indication: atrial fibrillation  Allergies[1]  Patient Measurements: Height: 5' 6 (167.6 cm) Weight: 65.7 kg (144 lb 13.5 oz) IBW/kg (Calculated) : 63.8 HEPARIN  DW (KG): 65.7  Vital Signs: Temp: 98.4 F (36.9 C) (12/29 0416) Temp Source: Oral (12/29 0416) BP: 129/66 (12/29 0416) Pulse Rate: 98 (12/29 0416)  Labs: Recent Labs    08/02/24 0352 08/03/24 0348 08/04/24 0320  HGB  --  12.0* 12.6*  HCT  --  35.8* 37.9*  PLT  --  182 179  LABPROT 30.5* 49.7* 42.8*  INR 2.8* 5.2* 4.3*  CREATININE 1.16 1.09 1.14    Estimated Creatinine Clearance: 49.7 mL/min (by C-G formula based on SCr of 1.14 mg/dL).   Medical History: Past Medical History:  Diagnosis Date   Anginal pain    Atrial fibrillation by electrocardiogram (HCC) 11/2019   Atrial flutter by electrocardiogram [facility]) 11/2019   Bladder cancer (HCC) 01/2020   Bladder tumor    BPH (benign prostatic hyperplasia)    CAD (coronary artery disease)    a. s/p INF MI 1997;  b. s/p CABG 2001;  c. LHC 11/2009:  3v CAD, S-PDA ok with 40% mid, S-OM ok, S-Dx ok, L-LAD ok, EF 50%;  d.  Lex MV 5/14:  Inferolateral scar, EF 46%, no ischemia   COPD (chronic obstructive pulmonary disease) (HCC)    Dyspnea    ETOH abuse    GERD (gastroesophageal reflux disease)    History of gout    Hypercholesterolemia    Hypertension    Melanoma (HCC)    Melanoma in situ (HCC) 05/21/2024   right lower back-- Tx Mohs Dr. Corey 07/01/2024   Myocardial infarction [facility]) 2013   Prostate cancer [facility])    Tobacco abuse     Assessment: 78 year old male on warfarin prior to admit for afib. INR 1.8 on admit this afternoon. Patient takes 5mg  on Tuesdays, Thursdays, and Saturdays, 2.5mg  all other days.   INR trending back down today from 2.8>5.2>4.3 this morning , no bleeding issues noted. CBC stable. Dose held 12/27, 12/28, and will hold another day.   Goal of Therapy:  INR  2-3 Monitor platelets by anticoagulation protocol: Yes   Plan:  Hold warfarin tonight Daily INR for now  Dempsey Blush PharmD., BCPS Clinical Pharmacist 08/04/2024 10:27 AM         [1] No Known Allergies

## 2024-08-04 NOTE — Telephone Encounter (Signed)
 Pharmacy Patient Advocate Encounter  Insurance verification completed.    The patient is insured through [facility]. Patient has Medicare and is not eligible for a copay card, but may be able to apply for patient assistance or Medicare RX Payment Plan (Patient Must reach out to their plan, if eligible for payment plan), if available.    Ran test claim for Eliquis  5mg  tablet and the current 30 day co-pay is $47.  Ran test claim for Xarelto 20mg  tablet and the current 30 day co-pay is $47.  This test claim was processed through [employer]- copay amounts may vary at other pharmacies due to [employer], or as the patient moves through the different stages of their insurance plan.

## 2024-08-04 NOTE — Progress Notes (Signed)
 "          PROGRESS NOTE  [name] FMW:[id number] DOB: March 16, 1946 DOA: 07/31/2024 PCP: Zollie Lowers, MD  Brief History:  76 male with a history of COPD, coronary disease status post MI, hypertension, hyperlipidemia, tobacco abuse, cardiothoracic, persistent atrial fibrillation on warfarin presenting with 1 day history of shortness of breath and worsening cough.  The patient states that he began having worsening shortness of breath and wheezing on the evening of 07/30/2024.  He has a largely nonproductive cough.  He denies any fevers, chills, chest pain, abdominal pain, nausea, vomiting, diarrhea, hematochezia, melena.  There is no dysuria or hematuria.  He continues to smoke 1 pack/day.  He has a 60-pack-year history.  He denies any worsening lower extremity edema or orthopnea or increasing abdominal girth.  He states that he has lost a few pounds in the past few months.  His last weight was 138 pounds about a week prior to this admission. In the ED, the patient was afebrile and hemodynamically stable with oxygen saturation of 86% on room air.  He was placed on 3 L with saturation of 95%.  WBC 9.1, hemoglobin 13.2, platelet 175.  Sodium 136, potassium 4.0, bicarbonate 21, serum creatinine 1.02.  AST 30, ALT 20, alk phosphatase 109, total bilirubin 1.1.  Albumin 4.0.  Chest x-ray showed chronic interstitial markings.  Patient was started on bronchodilators.  He was given a dose of furosemide  in the emergency department.  The patient was given Solu-Medrol  by EMS prior to arrival.   Assessment/Plan: Acute respiratory failure with hypoxia - Secondary to influenza and COPD exacerbation - Stable on 3-4 L - Wean oxygen as tolerated for saturation greater 92% - 12/28 repeat CXR--personally reviewed>>increased interstitial markings - restart IV lasix  - obtain CT chest   COPD exacerbation - Start Brovana  - Start Pulmicort  - Continue IV Solu-Medrol  - Continue DuoNebs - added yupelri  - start  empiric doxy   Supratherapeutic INR -PharmD assisting with warfarin dosing -INR 5.2>>4.3 -no signs of bleeding presently -am INR   Persistent atrial fibrillation - Continue metoprolol  succinate - Continue warfarin - rate controlled   Coronary artery disease - No chest pain presently -- 03/27/22 cath as reported below, PCI to SVG-OM1  - History of MI 1997 status post CABG - Continue metoprolol  succinate and statin   Acute on Chronic HFimpEF 02/2022 echo LVEF 40-45%, mild RV dysfunction, mild to mod MR - 06/2022 echo: LVEF 45-50% - 05/2023 echo: LVEF 50%, no WMAs, indet diastolic. Normal RV, mild MR -clinically compensated - continue Jardiance , spiro and metoprolol  succinate - restart IV lasix  - repeat echo   GERD - Continue PPI   Melanoma - Patient follows dermatology   Hypokalemia -replete -check mag 2.2   Goals of Care -12/28--discussed with pt and wife--confirm DNI,  he wants chest compression/shock             Family Communication:   son at bedside 12/29   Consultants:  none   Code Status:  DNI--confirmed with pt and wife   DVT Prophylaxis:  warfarin     Procedures: As Listed in Progress Note Above   Antibiotics: none        Subjective: Pt states sob is a little better, but has sob with minimal exertion.  Denies f/c, cp, n/v/d, abd pain  Objective: Vitals:   08/04/24 0647 08/04/24 0648 08/04/24 1327 08/04/24 1402  BP:   124/75   Pulse:   87   Resp:  Temp:      TempSrc:      SpO2: 95% 95%  94%  Weight:      Height:       No intake or output data in the 24 hours ending 08/04/24 1725 Weight change:  Exam:  General:  Pt is alert, follows commands appropriately, not in acute distress HEENT: No icterus, No thrush, No neck mass, [place]/AT Cardiovascular: RRR, S1/S2, no rubs, no gallops Respiratory: bibasilar wheeze.  Scattered rhonchi Abdomen: Soft/+BS, non tender, non distended, no guarding Extremities: No edema, No lymphangitis,  No petechiae, No rashes, no synovitis   Data Reviewed: I have personally reviewed following labs and imaging studies Basic Metabolic Panel: Recent Labs  Lab 07/31/24 1244 08/01/24 0415 08/02/24 0352 08/03/24 0348 08/04/24 0320  NA 136 135 136 137 135  K 4.0 3.7 3.4* 4.1 3.9  CL 99 96* 97* 101 97*  CO2 21* 28 25 28 27   GLUCOSE 91 164* 146* 141* 186*  BUN 14 26* 34* 40* 35*  CREATININE 1.02 1.32* 1.16 1.09 1.14  CALCIUM  9.2 9.2 9.4 8.8* 8.6*  MG  --  1.9 2.3 2.2 2.0   Liver Function Tests: Recent Labs  Lab 07/31/24 1244  AST 30  ALT 20  ALKPHOS 109  BILITOT 1.1  PROT 7.0  ALBUMIN 4.0   No results for input(s): LIPASE, AMYLASE in the last 168 hours. No results for input(s): AMMONIA in the last 168 hours. Coagulation Profile: Recent Labs  Lab 07/31/24 1304 08/01/24 0415 08/02/24 0352 08/03/24 0348 08/04/24 0320  INR 1.8* 1.7* 2.8* 5.2* 4.3*   CBC: Recent Labs  Lab 07/31/24 1244 08/01/24 0415 08/03/24 0348 08/04/24 0320  WBC 9.1 10.3 11.3* 8.3  NEUTROABS 6.3  --   --   --   HGB 13.2 13.1 12.0* 12.6*  HCT 39.9 38.8* 35.8* 37.9*  MCV 99.0 96.8 97.0 96.4  PLT 176 177 182 179   Cardiac Enzymes: No results for input(s): CKTOTAL, CKMB, CKMBINDEX, TROPONINI in the last 168 hours. BNP: Invalid input(s): POCBNP CBG: No results for input(s): GLUCAP in the last 168 hours. HbA1C: No results for input(s): HGBA1C in the last 72 hours. Urine analysis:    Component Value Date/Time   COLORURINE YELLOW 05/14/2023 1715   APPEARANCEUR CLEAR 05/14/2023 1715   APPEARANCEUR Clear 11/30/2022 1353   LABSPEC 1.009 05/14/2023 1715   LABSPEC 1.005 12/29/2008 1608   PHURINE 6.0 05/14/2023 1715   GLUCOSEU NEGATIVE 05/14/2023 1715   HGBUR NEGATIVE 05/14/2023 1715   BILIRUBINUR NEGATIVE 05/14/2023 1715   BILIRUBINUR Negative 11/30/2022 1353   BILIRUBINUR Negative 12/29/2008 1608   KETONESUR NEGATIVE 05/14/2023 1715   PROTEINUR 30 (A) 05/14/2023 1715    UROBILINOGEN 0.2 11/26/2012 1422   NITRITE NEGATIVE 05/14/2023 1715   LEUKOCYTESUR NEGATIVE 05/14/2023 1715   LEUKOCYTESUR Negative 12/29/2008 1608   Sepsis Labs: @LABRCNTIP (procalcitonin:4,lacticidven:4) ) Recent Results (from the past 240 hours)  Resp panel by RT-PCR (RSV, Flu A&B, Covid) Anterior Nasal Swab     Status: Abnormal   Collection Time: 07/31/24 12:47 PM   Specimen: Anterior Nasal Swab  Result Value Ref Range Status   SARS Coronavirus 2 by RT PCR NEGATIVE NEGATIVE Final    Comment: (NOTE) SARS-CoV-2 target nucleic acids are NOT DETECTED.  The SARS-CoV-2 RNA is generally detectable in upper respiratory specimens during the acute phase of infection. The lowest concentration of SARS-CoV-2 viral copies this assay can detect is 138 copies/mL. A negative result does not preclude SARS-Cov-2 infection and should not be  used as the sole basis for treatment or other patient management decisions. A negative result may occur with  improper specimen collection/handling, submission of specimen other than nasopharyngeal swab, presence of viral mutation(s) within the areas targeted by this assay, and inadequate number of viral copies(<138 copies/mL). A negative result must be combined with clinical observations, patient history, and epidemiological information. The expected result is Negative.  Fact Sheet for Patients:  bloggercourse.com  Fact Sheet for Healthcare Providers:  seriousbroker.it  This test is no t yet approved or cleared by the [place]  FDA and  has been authorized for detection and/or diagnosis of SARS-CoV-2 by FDA under an Emergency Use Authorization (EUA). This EUA will remain  in effect (meaning this test can be used) for the duration of the COVID-19 declaration under Section 564(b)(1) of the Act, 21 U.S.C.section 360bbb-3(b)(1), unless the authorization is terminated  or revoked sooner.        Influenza A by PCR POSITIVE (A) NEGATIVE Final   Influenza B by PCR NEGATIVE NEGATIVE Final    Comment: (NOTE) The Xpert Xpress SARS-CoV-2/FLU/RSV plus assay is intended as an aid in the diagnosis of influenza from Nasopharyngeal swab specimens and should not be used as a sole basis for treatment. Nasal washings and aspirates are unacceptable for Xpert Xpress SARS-CoV-2/FLU/RSV testing.  Fact Sheet for Patients: bloggercourse.com  Fact Sheet for Healthcare Providers: seriousbroker.it  This test is not yet approved or cleared by the [place]  FDA and has been authorized for detection and/or diagnosis of SARS-CoV-2 by FDA under an Emergency Use Authorization (EUA). This EUA will remain in effect (meaning this test can be used) for the duration of the COVID-19 declaration under Section 564(b)(1) of the Act, 21 U.S.C. section 360bbb-3(b)(1), unless the authorization is terminated or revoked.     Resp Syncytial Virus by PCR NEGATIVE NEGATIVE Final    Comment: (NOTE) Fact Sheet for Patients: bloggercourse.com  Fact Sheet for Healthcare Providers: seriousbroker.it  This test is not yet approved or cleared by the [place]  FDA and has been authorized for detection and/or diagnosis of SARS-CoV-2 by FDA under an Emergency Use Authorization (EUA). This EUA will remain in effect (meaning this test can be used) for the duration of the COVID-19 declaration under Section 564(b)(1) of the Act, 21 U.S.C. section 360bbb-3(b)(1), unless the authorization is terminated or revoked.  Performed at [facility], [address], [place], [place] [address]      Scheduled Meds:  albuterol   2.5 mg Nebulization Q6H   allopurinol   100 mg Oral Daily   arformoterol   15 mcg Nebulization BID   atorvastatin   80 mg Oral Daily   budesonide  (PULMICORT ) nebulizer solution  0.5 mg Nebulization BID    empagliflozin   10 mg Oral QAC breakfast   furosemide   40 mg Intravenous Daily   methylPREDNISolone  (SOLU-MEDROL ) injection  60 mg Intravenous Q12H   metoprolol  succinate  75 mg Oral Daily   oseltamivir   30 mg Oral BID   pantoprazole   40 mg Oral Daily   revefenacin   175 mcg Nebulization Daily   spironolactone   12.5 mg Oral BID   Warfarin - Pharmacist Dosing Inpatient   Does not apply q1600   Continuous Infusions:  Procedures/Studies: DG CHEST PORT 1 VIEW Result Date: 08/03/2024 CLINICAL DATA:  Dyspnea, COPD exacerbation, respiratory distress. EXAM: PORTABLE CHEST 1 VIEW COMPARISON:  07/31/2024. FINDINGS: The heart size and mediastinal contours are stable. There is atherosclerotic calcification of the aorta. Chronic interstitial thickening is noted bilaterally. Increased interstitial and  airspace disease is noted bilaterally with an upper lobe predominance as compared to the previous exam. No consolidation, effusion, or pneumothorax is seen. There is evidence of prior cardiothoracic surgery with midline sternotomy wires. Cervical spinal fusion hardware is present. No acute osseous abnormality. IMPRESSION: 1. Increased interstitial and airspace disease in the upper lobes bilaterally, possible edema or infiltrate. 2. Chronic interstitial changes. . Electronically Signed   By: Leita Birmingham M.D.   On: 08/03/2024 17:30   DG Chest Port 1 View Result Date: 07/31/2024 CLINICAL DATA:  Weakness and shortness of breath. EXAM: PORTABLE CHEST 1 VIEW COMPARISON:  May 14, 2023 FINDINGS: Multiple sternal wires and vascular clips are seen. The heart size and mediastinal contours are within normal limits. Mild, diffuse, chronic appearing increased interstitial lung markings are noted. No focal consolidation, pleural effusion or pneumothorax is identified. Postoperative changes are seen involving the lower cervical spine. Chronic right rib fractures are present. No acute osseous abnormalities are identified.  IMPRESSION: 1. Evidence of prior median sternotomy/CABG. 2. No acute or active cardiopulmonary disease. Electronically Signed   By: Suzen Dials M.D.   On: 07/31/2024 13:16    Alm Schneider, DO  [facility]  If 7PM-7AM, please contact night-coverage [email or web address] Password [email or web address] 08/04/2024, 5:25 PM   LOS: 4 days   "

## 2024-08-05 ENCOUNTER — Other Ambulatory Visit (HOSPITAL_COMMUNITY): Payer: Self-pay | Admitting: *Deleted

## 2024-08-05 ENCOUNTER — Inpatient Hospital Stay (HOSPITAL_COMMUNITY)

## 2024-08-05 DIAGNOSIS — I5031 Acute diastolic (congestive) heart failure: Secondary | ICD-10-CM

## 2024-08-05 LAB — PRO BRAIN NATRIURETIC PEPTIDE: Pro Brain Natriuretic Peptide: 2497 pg/mL — ABNORMAL HIGH

## 2024-08-05 LAB — ECHOCARDIOGRAM COMPLETE
Area-P 1/2: 6.96 cm2
Est EF: 50
Height: 66 in
S' Lateral: 3.55 cm
Weight: 2317.48 [oz_av]

## 2024-08-05 LAB — MAGNESIUM: Magnesium: 2.2 mg/dL (ref 1.7–2.4)

## 2024-08-05 LAB — BASIC METABOLIC PANEL WITH GFR
Anion gap: 10 (ref 5–15)
BUN: 40 mg/dL — ABNORMAL HIGH (ref 8–23)
CO2: 31 mmol/L (ref 22–32)
Calcium: 8.9 mg/dL (ref 8.9–10.3)
Chloride: 96 mmol/L — ABNORMAL LOW (ref 98–111)
Creatinine, Ser: 1.06 mg/dL (ref 0.61–1.24)
GFR, Estimated: >60 mL/min
Glucose, Bld: 174 mg/dL — ABNORMAL HIGH (ref 70–99)
Potassium: 4 mmol/L (ref 3.5–5.1)
Sodium: 137 mmol/L (ref 135–145)

## 2024-08-05 LAB — PROTIME-INR
INR: 3.4 — ABNORMAL HIGH (ref 0.8–1.2)
Prothrombin Time: 36.2 s — ABNORMAL HIGH (ref 11.4–15.2)

## 2024-08-05 LAB — D-DIMER, QUANTITATIVE: D-Dimer, Quant: 0.5 ug{FEU}/mL (ref 0.00–0.50)

## 2024-08-05 NOTE — Consult Note (Signed)
 "                                                                                   Consultation Note Date: 08/06/2024   Patient Name: [name]  DOB: [birth year]  MRN: [id number]  Age / Sex: 78 y.o., male  PCP: Zollie Lowers, MD Referring Physician: Evonnie Lenis, MD  Reason for Consultation: Establishing goals of care  HPI/Patient Profile: 78 y.o. male  with past medical history of COPD, CAD s/p MI, HTN, HLD, persistent atrial fibrillation on warfarin, bladder cancer, prostate cancer, BPH, melanoma on right lower back admitted on 07/31/2024 with shortness of breath due to + flu A and COPD exacerbation.   Clinical Assessment and Goals of Care: Consult received and chart review completed. I met today with [name] along with his [name], [name], at bedside. He is awake and alert. He is hard of hearing so I have to speak up especially through my mask but this seems to work. I ask about how he is feeling and he is feeling ~75% back to himself. He is feeling much better than he was a few days ago. He feels ready to go home - his [name] agrees. They are hopeful for tomorrow. We discussed his oxygen and that he was not using oxygen prior to admission - he tells me he was becoming more winded with activity and felt that he was close to needing oxygen at home even prior to admission so he would be okay if he needed this at home.   We spent time discussing his COPD and how we know this is progressing. He follows with Dr. Shelah. He has a good regimen. We discussed expectations as this progresses. We discussed the severity of his illness with severe COPD along with acute flu. We discussed why the doctor spoke with him about his wishes and code status. I spent much time explaining resuscitation including the elements of CPR, defibrillation, and ventilator support. We discussed expectations and prognosis if he were to require resuscitation. [name] shares that he would not want to be on a ventilator. We discussed  that the reality of resuscitation especially for him with severe lung disease would require ventilator support. We discussed that the reality of a quick shock bringing him back is unfortunately not the reality of how resuscitation works. We discussed the reality and expectations for him personally. After much discussion [name] shares that he would not want to be on a ventilator because he does not feel like he would ever improve to successfully come off ventilator. He confirms desire for DNR/DNI after much discussion. [name] at bedside was able to discuss with him as well and supports his decision. [name] does share that his son and daughter had many concerns about DNR when he shared this previously. I discussed the importance for [name] to share his wishes with his family so they know what he wants. I reassured them that DNR is in place only in worst case scenario and only in the event of death. There are many interventions and options for treatment to try and prevent us  from getting to the point of requiring resuscitation. They verbalized  understanding.   All questions/concerns addressed. Emotional support provided. Discussed with Dr. Ricky.   Primary Decision Maker PATIENT    SUMMARY OF RECOMMENDATIONS   - DNR/DNI confirmed - Hopeful for return home tomorrow - Accepting of oxygen and nebulizer at home if needed  Code Status/Advance Care Planning: DNR   Symptom Management:  Per attending, follow up pulmonology outpatient  Prognosis:  Improving from flu but with underlying COPD. Doing okay at this time.   Discharge Planning: Home with Home Health      Primary Diagnoses: Present on Admission:  Acute respiratory failure with hypoxia (HCC)  Chronic heart failure with mildly reduced ejection fraction (HFmrEF) (HCC)  Tobacco abuse  Persistent atrial fibrillation (HCC)  Mixed hyperlipidemia   I have reviewed the medical record, interviewed the patient and family, and examined the  patient. The following aspects are pertinent.  Past Medical History:  Diagnosis Date   Anginal pain    Atrial fibrillation by electrocardiogram (HCC) 11/2019   Atrial flutter by electrocardiogram [facility]) 11/2019   Bladder cancer (HCC) 01/2020   Bladder tumor    BPH (benign prostatic hyperplasia)    CAD (coronary artery disease)    a. s/p INF MI 1997;  b. s/p CABG 2001;  c. LHC 11/2009:  3v CAD, S-PDA ok with 40% mid, S-OM ok, S-Dx ok, L-LAD ok, EF 50%;  d.  Lex MV 5/14:  Inferolateral scar, EF 46%, no ischemia   COPD (chronic obstructive pulmonary disease) (HCC)    Dyspnea    ETOH abuse    GERD (gastroesophageal reflux disease)    History of gout    Hypercholesterolemia    Hypertension    Melanoma (HCC)    Melanoma in situ (HCC) 05/21/2024   right lower back-- Tx Mohs Dr. Corey 07/01/2024   Myocardial infarction [facility]) 2013   Prostate cancer (HCC)    Tobacco abuse    Social History   Socioeconomic History   Marital status: Married    Spouse name: Not on file   Number of children: 2   Years of education: Not on file   Highest education level: Not on file  Occupational History    Comment: retired  Tobacco Use   Smoking status: Every Day    Current packs/day: 1.00    Average packs/day: 1 pack/day for 59.4 years (59.4 ttl pk-yrs)    Types: Cigarettes, E-cigarettes    Start date: 03/08/1970    Passive exposure: Never   Smokeless tobacco: Former    Types: Snuff    Quit date: 03/08/2012   Tobacco comments:    10 - 15 cigarettes smoked daily [facility] 01/19/23  Vaping Use   Vaping status: Former  Substance and Sexual Activity   Alcohol use: Not Currently    Alcohol/week: 2.0 - 4.0 standard drinks of alcohol    Types: 2 - 4 Cans of beer per week    Comment: 2-4 beers per day   Drug use: No   Sexual activity: Yes    Partners: Female  Other Topics Concern   Not on file  Social History Narrative   His brother lives with him and his [name]. Daughter lives in [place], son lives nearby.    Social Drivers of Health   Tobacco Use: High Risk (07/31/2024)   Patient History    Smoking Tobacco Use: Every Day    Smokeless Tobacco Use: Former    Passive Exposure: Never  Physicist, Medical Strain: Low Risk (12/05/2023)   Overall Financial Resource Strain (CARDIA)  Difficulty of Paying Living Expenses: Not hard at all  Food Insecurity: No Food Insecurity (07/31/2024)   Epic    Worried About Programme Researcher, Broadcasting/film/video in the Last Year: Never true    Ran Out of Food in the Last Year: Never true  Transportation Needs: No Transportation Needs (07/31/2024)   Epic    Lack of Transportation (Medical): No    Lack of Transportation (Non-Medical): No  Physical Activity: Unknown (12/05/2023)   Exercise Vital Sign    Days of Exercise per Week: Not on file    Minutes of Exercise per Session: Patient declined  Stress: No Stress Concern Present (12/05/2023)   [employer] of Occupational Health - Occupational Stress Questionnaire    Feeling of Stress : Only a little  Social Connections: Socially Isolated (07/31/2024)   Social Connection and Isolation Panel    Frequency of Communication with Friends and Family: Once a week    Frequency of Social Gatherings with Friends and Family: Once a week    Attends Religious Services: Never    Database Administrator or Organizations: No    Attends Banker Meetings: Never    Marital Status: Married  Depression (PHQ2-9): Low Risk (04/24/2024)   Depression (PHQ2-9)    PHQ-2 Score: 3  Alcohol Screen: Low Risk (12/05/2023)   Alcohol Screen    Last Alcohol Screening Score (AUDIT): 0  Housing: Low Risk (07/31/2024)   Epic    Unable to Pay for Housing in the Last Year: No    Number of Times Moved in the Last Year: 0    Homeless in the Last Year: No  Utilities: Not At Risk (07/31/2024)   Epic    Threatened with loss of utilities: No  Health Literacy: Inadequate Health Literacy (12/05/2023)   B1300 Health Literacy    Frequency of need for  help with medical instructions: Always   Family History  Problem Relation Age of Onset   Diabetes Mother    Hypertension Father    Heart disease Brother    Diabetes Brother    Heart disease Brother    Diabetes Brother    Skin cancer Brother    Multiple sclerosis Daughter    Healthy Son    Colon cancer Neg Hx    Inflammatory bowel disease Neg Hx    Scheduled Meds:  albuterol   2.5 mg Nebulization Q6H   allopurinol   100 mg Oral Daily   arformoterol   15 mcg Nebulization BID   atorvastatin   80 mg Oral Daily   budesonide  (PULMICORT ) nebulizer solution  0.5 mg Nebulization BID   doxycycline   100 mg Oral Q12H   empagliflozin   10 mg Oral QAC breakfast   furosemide   40 mg Intravenous Daily   methylPREDNISolone  (SOLU-MEDROL ) injection  60 mg Intravenous Q12H   metoprolol  succinate  75 mg Oral Daily   oseltamivir   30 mg Oral BID   pantoprazole   40 mg Oral Daily   revefenacin   175 mcg Nebulization Daily   spironolactone   12.5 mg Oral BID   Warfarin - Pharmacist Dosing Inpatient   Does not apply q1600   Continuous Infusions: PRN Meds:.acetaminophen  **OR** acetaminophen , albuterol , fluticasone , HYDROcodone  bit-homatropine, melatonin, ondansetron  **OR** ondansetron  (ZOFRAN ) IV, mouth rinse Allergies[1] Review of Systems  Constitutional:  Positive for activity change.  Respiratory:  Positive for cough and shortness of breath.     Physical Exam Vitals and nursing note reviewed.  Constitutional:      Appearance: He is well-developed and well-groomed.  Cardiovascular:  Rate and Rhythm: Normal rate.  Pulmonary:     Effort: No tachypnea, accessory muscle usage or respiratory distress.     Comments: Short of breath after much conversation at rest Abdominal:     Palpations: Abdomen is soft.  Neurological:     Mental Status: He is alert and oriented to person, place, and time.     Vital Signs: BP (!) 146/96 (BP Location: Right Arm)   Pulse 91   Temp 97.8 F (36.6 C) (Oral)    Resp 20   Ht 5' 6 (1.676 m)   Wt 65.7 kg   SpO2 93%   BMI 23.38 kg/m  Pain Scale: 0-10   Pain Score: 0-No pain   SpO2: SpO2: 93 % O2 Device:SpO2: 93 % O2 Flow Rate: .O2 Flow Rate (L/min): 4 L/min  IO: Intake/output summary: No intake or output data in the 24 hours ending 08/05/24 1322  LBM: Last BM Date : 08/03/24 Baseline Weight: Weight: 65.7 kg Most recent weight: Weight: 65.7 kg       Time Total: 80 min  Greater than 50%  of this time was spent counseling and coordinating care related to the above assessment and plan.  Signed by: Bernarda Kitty, NP Palliative Medicine Team Pager # [phone] (M-F 8a-5p) Team Phone # [phone] (Nights/Weekends)                     [1] No Known Allergies  "

## 2024-08-05 NOTE — Progress Notes (Signed)
 "          PROGRESS NOTE  THIJS BRUNTON FMW:[id number] DOB: [birth year] DOA: 07/31/2024 PCP: Zollie Lowers, MD  Brief History:  28 male with a history of COPD, coronary disease status post MI, hypertension, hyperlipidemia, tobacco abuse, cardiothoracic, persistent atrial fibrillation on warfarin presenting with 1 day history of shortness of breath and worsening cough.  The patient states that he began having worsening shortness of breath and wheezing on the evening of 07/30/2024.  He has a largely nonproductive cough.  He denies any fevers, chills, chest pain, abdominal pain, nausea, vomiting, diarrhea, hematochezia, melena.  There is no dysuria or hematuria.  He continues to smoke 1 pack/day.  He has a 60-pack-year history.  He denies any worsening lower extremity edema or orthopnea or increasing abdominal girth.  He states that he has lost a few pounds in the past few months.  His last weight was 138 pounds about a week prior to this admission. In the ED, the patient was afebrile and hemodynamically stable with oxygen saturation of 86% on room air.  He was placed on 3 L with saturation of 95%.  WBC 9.1, hemoglobin 13.2, platelet 175.  Sodium 136, potassium 4.0, bicarbonate 21, serum creatinine 1.02.  AST 30, ALT 20, alk phosphatase 109, total bilirubin 1.1.  Albumin 4.0.  Chest x-ray showed chronic interstitial markings.  Patient was started on bronchodilators.  He was given a dose of furosemide  in the emergency department.  The patient was given Solu-Medrol  by EMS prior to arrival.   Assessment/Plan:  Acute respiratory failure with hypoxia - Secondary to influenza and COPD exacerbation - Stable on 3-4 L - Wean oxygen as tolerated for saturation greater 92% - 12/28 repeat CXR--personally reviewed>>increased interstitial markings - restart IV lasix  - 12/29 CT chest--. Interval extensive ground-glass interstitial opacity throughout the majority of both upper lobes;   -- large number of small patchy  densities in both lower lobes, predominantly in the dependent portions including multiple new 1-3 mm nodular densities. These are most likely due to an infectious process -12/30--obtain blood cultures x 2 and D-dimer -12/30--Echo   COPD exacerbation - Start Brovana  - Start Pulmicort  - Continue IV Solu-Medrol  - Continue DuoNebs - added yupelri  - start empiric doxy and ceftriaxone   Supratherapeutic INR -PharmD assisting with warfarin dosing -INR 5.2>>4.3 -no signs of bleeding presently -am INR   Persistent atrial fibrillation - Continue metoprolol  succinate - Continue warfarin - rate controlled   Coronary artery disease - No chest pain presently -- 03/27/22 cath as reported below, PCI to SVG-OM1  - History of MI 1997 status post CABG - Continue metoprolol  succinate and statin   Acute on Chronic HFimpEF 02/2022 echo LVEF 40-45%, mild RV dysfunction, mild to mod MR - 06/2022 echo: LVEF 45-50% - 05/2023 echo: LVEF 50%, no WMAs, indet diastolic. Normal RV, mild MR -clinically compensated - continue Jardiance , spiro and metoprolol  succinate - restarted IV lasix  - repeat echo   GERD - Continue PPI   Melanoma - Patient follows dermatology   Hypokalemia -replete -check mag 2.2   Goals of Care -12/28--discussed with pt and wife--confirm DNI,  he wants chest compression/shock -palliative consulted             Family Communication:   son/wife at bedside 12/30   Consultants:  none   Code Status:  DNI--confirmed with pt and wife   DVT Prophylaxis:  warfarin     Procedures: As Listed in Progress Note Above   Antibiotics:  Doxy 12/29>> Ceftriaxone 12/30>>       Subjective: Pt complains of sob with minimal exertion.  Denies f/c, cp, n/v/d, abd pain  Objective: Vitals:   08/05/24 0404 08/05/24 0417 08/05/24 0753 08/05/24 1208  BP:  126/78  (!) 146/96  Pulse:  80  91  Resp:  20    Temp:  97.7 F (36.5 C)  97.8 F (36.6 C)  TempSrc:  Oral  Oral   SpO2: 92% 93% 94% 93%  Weight:      Height:       No intake or output data in the 24 hours ending 08/05/24 1540 Weight change:  Exam:  General:  Pt is alert, follows commands appropriately, not in acute distress HEENT: No icterus, No thrush, No neck mass, [place]/AT Cardiovascular: RRR, S1/S2, no rubs, no gallops Respiratory: bilateral rhonchi.  Bilateral wheeze Abdomen: Soft/+BS, non tender, non distended, no guarding Extremities: No edema, No lymphangitis, No petechiae, No rashes, no synovitis   Data Reviewed: I have personally reviewed following labs and imaging studies Basic Metabolic Panel: Recent Labs  Lab 08/01/24 0415 08/02/24 0352 08/03/24 0348 08/04/24 0320 08/05/24 0438  NA 135 136 137 135 137  K 3.7 3.4* 4.1 3.9 4.0  CL 96* 97* 101 97* 96*  CO2 28 25 28 27 31   GLUCOSE 164* 146* 141* 186* 174*  BUN 26* 34* 40* 35* 40*  CREATININE 1.32* 1.16 1.09 1.14 1.06  CALCIUM  9.2 9.4 8.8* 8.6* 8.9  MG 1.9 2.3 2.2 2.0 2.2   Liver Function Tests: Recent Labs  Lab 07/31/24 1244  AST 30  ALT 20  ALKPHOS 109  BILITOT 1.1  PROT 7.0  ALBUMIN 4.0   No results for input(s): LIPASE, AMYLASE in the last 168 hours. No results for input(s): AMMONIA in the last 168 hours. Coagulation Profile: Recent Labs  Lab 08/01/24 0415 08/02/24 0352 08/03/24 0348 08/04/24 0320 08/05/24 0438  INR 1.7* 2.8* 5.2* 4.3* 3.4*   CBC: Recent Labs  Lab 07/31/24 1244 08/01/24 0415 08/03/24 0348 08/04/24 0320  WBC 9.1 10.3 11.3* 8.3  NEUTROABS 6.3  --   --   --   HGB 13.2 13.1 12.0* 12.6*  HCT 39.9 38.8* 35.8* 37.9*  MCV 99.0 96.8 97.0 96.4  PLT 176 177 182 179   Cardiac Enzymes: No results for input(s): CKTOTAL, CKMB, CKMBINDEX, TROPONINI in the last 168 hours. BNP: Invalid input(s): POCBNP CBG: No results for input(s): GLUCAP in the last 168 hours. HbA1C: No results for input(s): HGBA1C in the last 72 hours. Urine analysis:    Component Value Date/Time    COLORURINE YELLOW 05/14/2023 1715   APPEARANCEUR CLEAR 05/14/2023 1715   APPEARANCEUR Clear 11/30/2022 1353   LABSPEC 1.009 05/14/2023 1715   LABSPEC 1.005 12/29/2008 1608   PHURINE 6.0 05/14/2023 1715   GLUCOSEU NEGATIVE 05/14/2023 1715   HGBUR NEGATIVE 05/14/2023 1715   BILIRUBINUR NEGATIVE 05/14/2023 1715   BILIRUBINUR Negative 11/30/2022 1353   BILIRUBINUR Negative 12/29/2008 1608   KETONESUR NEGATIVE 05/14/2023 1715   PROTEINUR 30 (A) 05/14/2023 1715   UROBILINOGEN 0.2 11/26/2012 1422   NITRITE NEGATIVE 05/14/2023 1715   LEUKOCYTESUR NEGATIVE 05/14/2023 1715   LEUKOCYTESUR Negative 12/29/2008 1608   Sepsis Labs: @LABRCNTIP (procalcitonin:4,lacticidven:4) ) Recent Results (from the past 240 hours)  Resp panel by RT-PCR (RSV, Flu A&B, Covid) Anterior Nasal Swab     Status: Abnormal   Collection Time: 07/31/24 12:47 PM   Specimen: Anterior Nasal Swab  Result Value Ref Range Status   SARS Coronavirus  2 by RT PCR NEGATIVE NEGATIVE Final    Comment: (NOTE) SARS-CoV-2 target nucleic acids are NOT DETECTED.  The SARS-CoV-2 RNA is generally detectable in upper respiratory specimens during the acute phase of infection. The lowest concentration of SARS-CoV-2 viral copies this assay can detect is 138 copies/mL. A negative result does not preclude SARS-Cov-2 infection and should not be used as the sole basis for treatment or other patient management decisions. A negative result may occur with  improper specimen collection/handling, submission of specimen other than nasopharyngeal swab, presence of viral mutation(s) within the areas targeted by this assay, and inadequate number of viral copies(<138 copies/mL). A negative result must be combined with clinical observations, patient history, and epidemiological information. The expected result is Negative.  Fact Sheet for Patients:  bloggercourse.com  Fact Sheet for Healthcare Providers:   seriousbroker.it  This test is no t yet approved or cleared by the [place]  FDA and  has been authorized for detection and/or diagnosis of SARS-CoV-2 by FDA under an Emergency Use Authorization (EUA). This EUA will remain  in effect (meaning this test can be used) for the duration of the COVID-19 declaration under Section 564(b)(1) of the Act, 21 U.S.C.section 360bbb-3(b)(1), unless the authorization is terminated  or revoked sooner.       Influenza A by PCR POSITIVE (A) NEGATIVE Final   Influenza B by PCR NEGATIVE NEGATIVE Final    Comment: (NOTE) The Xpert Xpress SARS-CoV-2/FLU/RSV plus assay is intended as an aid in the diagnosis of influenza from Nasopharyngeal swab specimens and should not be used as a sole basis for treatment. Nasal washings and aspirates are unacceptable for Xpert Xpress SARS-CoV-2/FLU/RSV testing.  Fact Sheet for Patients: bloggercourse.com  Fact Sheet for Healthcare Providers: seriousbroker.it  This test is not yet approved or cleared by the [place]  FDA and has been authorized for detection and/or diagnosis of SARS-CoV-2 by FDA under an Emergency Use Authorization (EUA). This EUA will remain in effect (meaning this test can be used) for the duration of the COVID-19 declaration under Section 564(b)(1) of the Act, 21 U.S.C. section 360bbb-3(b)(1), unless the authorization is terminated or revoked.     Resp Syncytial Virus by PCR NEGATIVE NEGATIVE Final    Comment: (NOTE) Fact Sheet for Patients: bloggercourse.com  Fact Sheet for Healthcare Providers: seriousbroker.it  This test is not yet approved or cleared by the [place]  FDA and has been authorized for detection and/or diagnosis of SARS-CoV-2 by FDA under an Emergency Use Authorization (EUA). This EUA will remain in effect (meaning this test can be used)  for the duration of the COVID-19 declaration under Section 564(b)(1) of the Act, 21 U.S.C. section 360bbb-3(b)(1), unless the authorization is terminated or revoked.  Performed at [facility], [address] [place], [place] [address]      Scheduled Meds:  albuterol   2.5 mg Nebulization Q6H   allopurinol   100 mg Oral Daily   arformoterol   15 mcg Nebulization BID   atorvastatin   80 mg Oral Daily   budesonide  (PULMICORT ) nebulizer solution  0.5 mg Nebulization BID   doxycycline   100 mg Oral Q12H   empagliflozin   10 mg Oral QAC breakfast   furosemide   40 mg Intravenous Daily   methylPREDNISolone  (SOLU-MEDROL ) injection  60 mg Intravenous Q12H   metoprolol  succinate  75 mg Oral Daily   oseltamivir   30 mg Oral BID   pantoprazole   40 mg Oral Daily   revefenacin   175 mcg Nebulization Daily   spironolactone   12.5 mg  Oral BID   Warfarin - Pharmacist Dosing Inpatient   Does not apply q1600   Continuous Infusions:  Procedures/Studies: CT CHEST WO CONTRAST Result Date: 08/05/2024 CLINICAL DATA:  Acute respiratory failure with hypoxia. Increased interstitial and airspace disease in the upper lobes bilaterally on recent chest radiographs. EXAM: CT CHEST WITHOUT CONTRAST TECHNIQUE: Multidetector CT imaging of the chest was performed following the standard protocol without IV contrast. RADIATION DOSE REDUCTION: This exam was performed according to the departmental dose-optimization program which includes automated exposure control, adjustment of the mA and/or kV according to patient size and/or use of iterative reconstruction technique. COMPARISON:  Chest radiographs dated 08/03/2024 and 07/31/2024. Chest CT dated 04/14/2024. FINDINGS: Cardiovascular: Atheromatous calcifications, including the coronary arteries and aorta. Post CABG changes. Normal-sized heart. No pericardial effusion. Mediastinum/Nodes: No enlarged mediastinal or axillary lymph nodes. Thyroid  gland, trachea, and esophagus demonstrate  no significant findings. Lungs/Pleura: Interval extensive ground-glass interstitial opacity throughout the majority of both upper lobes with underlying centrilobular emphysematous changes. Interval minimal ground-glass interstitial prominence in the right middle lobe. Moderate to marked diffuse peribronchial thickening with mild progression. Interval large number of small patchy densities in both lower lobes, predominantly in the dependent portions including multiple new 1-3 mm nodular densities. No pleural fluid. Upper Abdomen: Atheromatous arterial calcifications. Stable 1.5 cm rounded, circumscribed, exophytic, low-density right renal mass measuring 24 Hounsfield units in density, compatible with a mildly complicated cyst not needing imaging follow-up. Atheromatous arterial calcifications. Musculoskeletal: Thoracic and lower cervical spine degenerative changes and cervical spine fixation hardware. Median sternotomy wires. IMPRESSION: 1. Interval extensive ground-glass interstitial opacity throughout the majority of both upper lobes with underlying centrilobular emphysematous changes. This is most likely due to an infectious process. 2. Interval large number of small patchy densities in both lower lobes, predominantly in the dependent portions including multiple new 1-3 mm nodular densities. These are most likely due to an infectious process, including the possibility of small septic emboli. 3. Moderate to marked diffuse bronchitic changes with mild progression. 4. Calcific coronary artery and aortic atherosclerosis. Aortic Atherosclerosis (ICD10-I70.0) and Emphysema (ICD10-J43.9). Electronically Signed   By: Elspeth Bathe M.D.   On: 08/05/2024 15:11   DG CHEST PORT 1 VIEW Result Date: 08/03/2024 CLINICAL DATA:  Dyspnea, COPD exacerbation, respiratory distress. EXAM: PORTABLE CHEST 1 VIEW COMPARISON:  07/31/2024. FINDINGS: The heart size and mediastinal contours are stable. There is atherosclerotic  calcification of the aorta. Chronic interstitial thickening is noted bilaterally. Increased interstitial and airspace disease is noted bilaterally with an upper lobe predominance as compared to the previous exam. No consolidation, effusion, or pneumothorax is seen. There is evidence of prior cardiothoracic surgery with midline sternotomy wires. Cervical spinal fusion hardware is present. No acute osseous abnormality. IMPRESSION: 1. Increased interstitial and airspace disease in the upper lobes bilaterally, possible edema or infiltrate. 2. Chronic interstitial changes. . Electronically Signed   By: Leita Birmingham M.D.   On: 08/03/2024 17:30   DG Chest Port 1 View Result Date: 07/31/2024 CLINICAL DATA:  Weakness and shortness of breath. EXAM: PORTABLE CHEST 1 VIEW COMPARISON:  May 14, 2023 FINDINGS: Multiple sternal wires and vascular clips are seen. The heart size and mediastinal contours are within normal limits. Mild, diffuse, chronic appearing increased interstitial lung markings are noted. No focal consolidation, pleural effusion or pneumothorax is identified. Postoperative changes are seen involving the lower cervical spine. Chronic right rib fractures are present. No acute osseous abnormalities are identified. IMPRESSION: 1. Evidence of prior median sternotomy/CABG. 2. No acute or  active cardiopulmonary disease. Electronically Signed   By: Suzen Dials M.D.   On: 07/31/2024 13:16    Alm Schneider, DO  [facility]  If 7PM-7AM, please contact night-coverage [email or web address] Password [email or web address] 08/05/2024, 3:40 PM   LOS: 5 days   "

## 2024-08-05 NOTE — Progress Notes (Signed)
*  PRELIMINARY RESULTS* Echocardiogram 2D Echocardiogram has been performed.  [name] 08/05/2024, 3:06 PM

## 2024-08-05 NOTE — Plan of Care (Signed)
   Problem: Activity: Goal: Risk for activity intolerance will decrease Outcome: Progressing   Problem: Coping: Goal: Level of anxiety will decrease Outcome: Progressing

## 2024-08-05 NOTE — Progress Notes (Signed)
 PHARMACY - ANTICOAGULATION CONSULT NOTE  Pharmacy Consult for warfarin Indication: atrial fibrillation  Allergies[1]  Patient Measurements: Height: 5' 6 (167.6 cm) Weight: 65.7 kg (144 lb 13.5 oz) IBW/kg (Calculated) : 63.8 HEPARIN  DW (KG): 65.7  Vital Signs: Temp: 97.7 F (36.5 C) (12/30 0417) Temp Source: Oral (12/30 0417) BP: 126/78 (12/30 0417) Pulse Rate: 80 (12/30 0417)  Labs: Recent Labs    08/03/24 0348 08/04/24 0320 08/05/24 0438  HGB 12.0* 12.6*  --   HCT 35.8* 37.9*  --   PLT 182 179  --   LABPROT 49.7* 42.8* 36.2*  INR 5.2* 4.3* 3.4*  CREATININE 1.09 1.14 1.06    Estimated Creatinine Clearance: 53.5 mL/min (by C-G formula based on SCr of 1.06 mg/dL).   Medical History: Past Medical History:  Diagnosis Date   Anginal pain    Atrial fibrillation by electrocardiogram (HCC) 11/2019   Atrial flutter by electrocardiogram [facility]) 11/2019   Bladder cancer (HCC) 01/2020   Bladder tumor    BPH (benign prostatic hyperplasia)    CAD (coronary artery disease)    a. s/p INF MI 1997;  b. s/p CABG 2001;  c. LHC 11/2009:  3v CAD, S-PDA ok with 40% mid, S-OM ok, S-Dx ok, L-LAD ok, EF 50%;  d.  Lex MV 5/14:  Inferolateral scar, EF 46%, no ischemia   COPD (chronic obstructive pulmonary disease) (HCC)    Dyspnea    ETOH abuse    GERD (gastroesophageal reflux disease)    History of gout    Hypercholesterolemia    Hypertension    Melanoma (HCC)    Melanoma in situ (HCC) 05/21/2024   right lower back-- Tx Mohs Dr. Corey 07/01/2024   Myocardial infarction [facility]) 2013   Prostate cancer [facility])    Tobacco abuse     Assessment: 78 year old male on warfarin prior to admit for afib. INR 1.8 on admit this afternoon. Patient takes 5mg  on Tuesdays, Thursdays, and Saturdays, 2.5mg  all other days.   INR trending back down today from 2.8>5.2>4.3>3.4 this morning , no bleeding issues noted. CBC stable.   Goal of Therapy:  INR 2-3 Monitor platelets by anticoagulation protocol:  Yes   Plan:  Hold warfarin tonight Daily INR for now  Elspeth Sour, PharmD Clinical Pharmacist 08/05/2024 8:55 AM           [1] No Known Allergies

## 2024-08-06 DIAGNOSIS — I4819 Other persistent atrial fibrillation: Secondary | ICD-10-CM | POA: Diagnosis not present

## 2024-08-06 DIAGNOSIS — E782 Mixed hyperlipidemia: Secondary | ICD-10-CM

## 2024-08-06 DIAGNOSIS — J9601 Acute respiratory failure with hypoxia: Secondary | ICD-10-CM | POA: Diagnosis not present

## 2024-08-06 DIAGNOSIS — K219 Gastro-esophageal reflux disease without esophagitis: Secondary | ICD-10-CM | POA: Diagnosis not present

## 2024-08-06 DIAGNOSIS — F17210 Nicotine dependence, cigarettes, uncomplicated: Secondary | ICD-10-CM

## 2024-08-06 DIAGNOSIS — J111 Influenza due to unidentified influenza virus with other respiratory manifestations: Secondary | ICD-10-CM | POA: Diagnosis not present

## 2024-08-06 DIAGNOSIS — I5023 Acute on chronic systolic (congestive) heart failure: Secondary | ICD-10-CM | POA: Diagnosis not present

## 2024-08-06 DIAGNOSIS — I5032 Chronic diastolic (congestive) heart failure: Secondary | ICD-10-CM

## 2024-08-06 DIAGNOSIS — Z515 Encounter for palliative care: Secondary | ICD-10-CM

## 2024-08-06 DIAGNOSIS — J441 Chronic obstructive pulmonary disease with (acute) exacerbation: Secondary | ICD-10-CM | POA: Diagnosis not present

## 2024-08-06 DIAGNOSIS — Z720 Tobacco use: Secondary | ICD-10-CM | POA: Diagnosis not present

## 2024-08-06 LAB — BASIC METABOLIC PANEL WITH GFR
Anion gap: 7 (ref 5–15)
BUN: 46 mg/dL — ABNORMAL HIGH (ref 8–23)
CO2: 32 mmol/L (ref 22–32)
Calcium: 8.6 mg/dL — ABNORMAL LOW (ref 8.9–10.3)
Chloride: 97 mmol/L — ABNORMAL LOW (ref 98–111)
Creatinine, Ser: 1.06 mg/dL (ref 0.61–1.24)
GFR, Estimated: >60 mL/min
Glucose, Bld: 216 mg/dL — ABNORMAL HIGH (ref 70–99)
Potassium: 3.4 mmol/L — ABNORMAL LOW (ref 3.5–5.1)
Sodium: 136 mmol/L (ref 135–145)

## 2024-08-06 LAB — PROTIME-INR
INR: 2.3 — ABNORMAL HIGH (ref 0.8–1.2)
Prothrombin Time: 26.4 s — ABNORMAL HIGH (ref 11.4–15.2)

## 2024-08-06 LAB — MAGNESIUM: Magnesium: 2.3 mg/dL (ref 1.7–2.4)

## 2024-08-06 MED ORDER — POTASSIUM CHLORIDE CRYS ER 20 MEQ PO TBCR
40.0000 meq | EXTENDED_RELEASE_TABLET | Freq: Once | ORAL | Status: AC
  Administered 2024-08-06: 40 meq via ORAL
  Filled 2024-08-06: qty 4

## 2024-08-06 MED ORDER — FUROSEMIDE 20 MG PO TABS
20.0000 mg | ORAL_TABLET | Freq: Every day | ORAL | Status: DC
  Administered 2024-08-07: 20 mg via ORAL
  Filled 2024-08-06: qty 1

## 2024-08-06 MED ORDER — WARFARIN SODIUM 2.5 MG PO TABS
2.5000 mg | ORAL_TABLET | Freq: Once | ORAL | Status: AC
  Administered 2024-08-06: 2.5 mg via ORAL
  Filled 2024-08-06: qty 1

## 2024-08-06 NOTE — Progress Notes (Signed)
 "          PROGRESS NOTE  [name] FMW:[id number] DOB: February 27, 1946 DOA: 07/31/2024 PCP: Zollie Lowers, MD  Brief History:  14 male with a history of COPD, coronary disease status post MI, hypertension, hyperlipidemia, tobacco abuse, cardiothoracic, persistent atrial fibrillation on warfarin presenting with 1 day history of shortness of breath and worsening cough.  The patient states that he began having worsening shortness of breath and wheezing on the evening of 07/30/2024.  He has a largely nonproductive cough.  He denies any fevers, chills, chest pain, abdominal pain, nausea, vomiting, diarrhea, hematochezia, melena.  There is no dysuria or hematuria.  He continues to smoke 1 pack/day.  He has a 60-pack-year history.  He denies any worsening lower extremity edema or orthopnea or increasing abdominal girth.  He states that he has lost a few pounds in the past few months.  His last weight was 138 pounds about a week prior to this admission. In the ED, the patient was afebrile and hemodynamically stable with oxygen saturation of 86% on room air.  He was placed on 3 L with saturation of 95%.  WBC 9.1, hemoglobin 13.2, platelet 175.  Sodium 136, potassium 4.0, bicarbonate 21, serum creatinine 1.02.  AST 30, ALT 20, alk phosphatase 109, total bilirubin 1.1.  Albumin 4.0.  Chest x-ray showed chronic interstitial markings.  Patient was started on bronchodilators.  He was given a dose of furosemide  in the emergency department.  The patient was given Solu-Medrol  by EMS prior to arrival.    Assessment/Plan:  Acute respiratory failure with hypoxia - Secondary to influenza and COPD exacerbation - Stable on 2-3 L currently - Continue to wean off oxygen supplementation as tolerated and check desaturation screening. - 12/28 repeat CXR--personally reviewed>>increased interstitial markings - restart IV lasix  - 12/29 CT chest--. Interval extensive ground-glass interstitial opacity throughout the majority of  both upper lobes;   -- large number of small patchy densities in both lower lobes, predominantly in the dependent portions including multiple new 1-3 mm nodular densities. These are most likely due to an infectious process -12/30--obtain blood cultures x 2 and D-dimer -12/30--Echo -Continue to follow clinical response.   COPD exacerbation - Continue Brovana  and Pulmicort  - Continue IV Solu-Medrol  - Continue DuoNebs - Continue yupelri  and continue the use of doxy and ceftriaxone. - Overall doing better.   Supratherapeutic INR -INR 5.2>>4.3 -no signs of bleeding presently - Continue to follow INR and follow pharmacy for dose recommendation to maintain therapeutic levels.   Persistent atrial fibrillation - Continue metoprolol  succinate - Continue warfarin - rate controlled   Coronary artery disease - No chest pain presently -- 03/27/22 cath as reported below, PCI to SVG-OM1  - History of MI 1997 status post CABG - Continue metoprolol  succinate and statin   Acute on Chronic HFimpEF 02/2022 echo LVEF 40-45%, mild RV dysfunction, mild to mod MR - 06/2022 echo: LVEF 45-50% - 05/2023 echo: LVEF 50%, no WMAs, indet diastolic. Normal RV, mild MR -clinically compensated - continue Jardiance , spiro and metoprolol  succinate - restarted IV lasix  - repeat echo   GERD - Continue PPI   Melanoma - Patient follows dermatology   Hypokalemia -replete -check mag 2.2   Goals of Care -08/06/24 goals of care has been clarified by palliative care service; patient is DNR/DNI. - Continue current management.     Family Communication: Wife at bedside. Consultants: Palliative care service.   Code Status: DNR/DNI. DVT Prophylaxis:  warfarin  Procedures: As Listed in Progress Note Above   Antibiotics: Doxy 12/29>> Ceftriaxone 12/30>>   Subjective: Pt complains of sob with minimal exertion.  Denies f/c, cp, n/v/d, abd pain  Objective: Vitals:   08/06/24 0607 08/06/24 0748  08/06/24 0817 08/06/24 1300  BP: 135/81  134/78 138/85  Pulse: 86  90 84  Resp: 17   16  Temp: 97.8 F (36.6 C)   98.3 F (36.8 C)  TempSrc: Oral   Oral  SpO2: 92% 92%  95%  Weight:      Height:        Intake/Output Summary (Last 24 hours) at 08/06/2024 1736 Last data filed at 08/06/2024 1300 Gross per 24 hour  Intake 980 ml  Output 500 ml  Net 480 ml   Weight change:   Exam: General exam: Alert, awake, following commands appropriately reports breathing easier. Respiratory system: 2-3 L nasal cannula supplementation in place; no using accessory muscles.  Positive scattered rhonchi abdominal expiratory wheezing appreciated on exam. Cardiovascular system: Rate controlled, no rubs, no gallops, no murmurs on exam. Gastrointestinal system: Abdomen is nondistended, soft and nontender. No organomegaly or masses felt. Normal bowel sounds heard. Central nervous system: No focal neurological deficits. Extremities: No C/C/E, +pedal pulses Skin: No petechiae. Psychiatry: Judgement and insight appear normal. Mood & affect appropriate.   Data Reviewed: I have personally reviewed following labs and imaging studies  Basic Metabolic Panel: Recent Labs  Lab 08/02/24 0352 08/03/24 0348 08/04/24 0320 08/05/24 0438 08/06/24 0445  NA 136 137 135 137 136  K 3.4* 4.1 3.9 4.0 3.4*  CL 97* 101 97* 96* 97*  CO2 25 28 27 31  32  GLUCOSE 146* 141* 186* 174* 216*  BUN 34* 40* 35* 40* 46*  CREATININE 1.16 1.09 1.14 1.06 1.06  CALCIUM  9.4 8.8* 8.6* 8.9 8.6*  MG 2.3 2.2 2.0 2.2 2.3   Liver Function Tests: Recent Labs  Lab 07/31/24 1244  AST 30  ALT 20  ALKPHOS 109  BILITOT 1.1  PROT 7.0  ALBUMIN 4.0   Coagulation Profile: Recent Labs  Lab 08/02/24 0352 08/03/24 0348 08/04/24 0320 08/05/24 0438 08/06/24 0445  INR 2.8* 5.2* 4.3* 3.4* 2.3*   CBC: Recent Labs  Lab 07/31/24 1244 08/01/24 0415 08/03/24 0348 08/04/24 0320  WBC 9.1 10.3 11.3* 8.3  NEUTROABS 6.3  --   --   --    HGB 13.2 13.1 12.0* 12.6*  HCT 39.9 38.8* 35.8* 37.9*  MCV 99.0 96.8 97.0 96.4  PLT 176 177 182 179   HbA1C: No results for input(s): HGBA1C in the last 72 hours.  Urine analysis:    Component Value Date/Time   COLORURINE YELLOW 05/14/2023 1715   APPEARANCEUR CLEAR 05/14/2023 1715   APPEARANCEUR Clear 11/30/2022 1353   LABSPEC 1.009 05/14/2023 1715   LABSPEC 1.005 12/29/2008 1608   PHURINE 6.0 05/14/2023 1715   GLUCOSEU NEGATIVE 05/14/2023 1715   HGBUR NEGATIVE 05/14/2023 1715   BILIRUBINUR NEGATIVE 05/14/2023 1715   BILIRUBINUR Negative 11/30/2022 1353   BILIRUBINUR Negative 12/29/2008 1608   KETONESUR NEGATIVE 05/14/2023 1715   PROTEINUR 30 (A) 05/14/2023 1715   UROBILINOGEN 0.2 11/26/2012 1422   NITRITE NEGATIVE 05/14/2023 1715   LEUKOCYTESUR NEGATIVE 05/14/2023 1715   LEUKOCYTESUR Negative 12/29/2008 1608   Sepsis Labs: @LABRCNTIP (procalcitonin:4,lacticidven:4) ) Recent Results (from the past 240 hours)  Resp panel by RT-PCR (RSV, Flu A&B, Covid) Anterior Nasal Swab     Status: Abnormal   Collection Time: 07/31/24 12:47 PM   Specimen: Anterior Nasal  Swab  Result Value Ref Range Status   SARS Coronavirus 2 by RT PCR NEGATIVE NEGATIVE Final    Comment: (NOTE) SARS-CoV-2 target nucleic acids are NOT DETECTED.  The SARS-CoV-2 RNA is generally detectable in upper respiratory specimens during the acute phase of infection. The lowest concentration of SARS-CoV-2 viral copies this assay can detect is 138 copies/mL. A negative result does not preclude SARS-Cov-2 infection and should not be used as the sole basis for treatment or other patient management decisions. A negative result may occur with  improper specimen collection/handling, submission of specimen other than nasopharyngeal swab, presence of viral mutation(s) within the areas targeted by this assay, and inadequate number of viral copies(<138 copies/mL). A negative result must be combined with clinical  observations, patient history, and epidemiological information. The expected result is Negative.  Fact Sheet for Patients:  bloggercourse.com  Fact Sheet for Healthcare Providers:  seriousbroker.it  This test is no t yet approved or cleared by the [place]  FDA and  has been authorized for detection and/or diagnosis of SARS-CoV-2 by FDA under an Emergency Use Authorization (EUA). This EUA will remain  in effect (meaning this test can be used) for the duration of the COVID-19 declaration under Section 564(b)(1) of the Act, 21 U.S.C.section 360bbb-3(b)(1), unless the authorization is terminated  or revoked sooner.       Influenza A by PCR POSITIVE (A) NEGATIVE Final   Influenza B by PCR NEGATIVE NEGATIVE Final    Comment: (NOTE) The Xpert Xpress SARS-CoV-2/FLU/RSV plus assay is intended as an aid in the diagnosis of influenza from Nasopharyngeal swab specimens and should not be used as a sole basis for treatment. Nasal washings and aspirates are unacceptable for Xpert Xpress SARS-CoV-2/FLU/RSV testing.  Fact Sheet for Patients: bloggercourse.com  Fact Sheet for Healthcare Providers: seriousbroker.it  This test is not yet approved or cleared by the [place]  FDA and has been authorized for detection and/or diagnosis of SARS-CoV-2 by FDA under an Emergency Use Authorization (EUA). This EUA will remain in effect (meaning this test can be used) for the duration of the COVID-19 declaration under Section 564(b)(1) of the Act, 21 U.S.C. section 360bbb-3(b)(1), unless the authorization is terminated or revoked.     Resp Syncytial Virus by PCR NEGATIVE NEGATIVE Final    Comment: (NOTE) Fact Sheet for Patients: bloggercourse.com  Fact Sheet for Healthcare Providers: seriousbroker.it  This test is not yet approved or cleared  by the [place]  FDA and has been authorized for detection and/or diagnosis of SARS-CoV-2 by FDA under an Emergency Use Authorization (EUA). This EUA will remain in effect (meaning this test can be used) for the duration of the COVID-19 declaration under Section 564(b)(1) of the Act, 21 U.S.C. section 360bbb-3(b)(1), unless the authorization is terminated or revoked.  Performed at [facility], [address], [place], [place] [address]   Culture, blood (Routine X 2) w Reflex to ID Panel     Status: None (Preliminary result)   Collection Time: 08/05/24  4:36 PM   Specimen: BLOOD  Result Value Ref Range Status   Specimen Description BLOOD BLOOD RIGHT HAND  Final   Special Requests   Final    BOTTLES DRAWN AEROBIC ONLY Blood Culture adequate volume   Culture   Final    NO GROWTH < 24 HOURS Performed at [facility], [address], [place], [place] [address]    Report Status PENDING  Incomplete  Culture, blood (Routine X 2) w Reflex to ID Panel  Status: None (Preliminary result)   Collection Time: 08/05/24  4:36 PM   Specimen: BLOOD  Result Value Ref Range Status   Specimen Description BLOOD BLOOD RIGHT WRIST  Final   Special Requests   Final    BOTTLES DRAWN AEROBIC ONLY Blood Culture adequate volume   Culture   Final    NO GROWTH < 24 HOURS Performed at [facility], [address] [place], [place] [address]    Report Status PENDING  Incomplete     Scheduled Meds:  albuterol   2.5 mg Nebulization Q6H   allopurinol   100 mg Oral Daily   arformoterol   15 mcg Nebulization BID   atorvastatin   80 mg Oral Daily   budesonide  (PULMICORT ) nebulizer solution  0.5 mg Nebulization BID   doxycycline   100 mg Oral Q12H   empagliflozin   10 mg Oral QAC breakfast   methylPREDNISolone  (SOLU-MEDROL ) injection  60 mg Intravenous Q12H   metoprolol  succinate  75 mg Oral Daily   pantoprazole   40 mg Oral Daily   revefenacin   175 mcg Nebulization Daily   spironolactone   12.5 mg Oral BID    Warfarin - Pharmacist Dosing Inpatient   Does not apply q1600   Continuous Infusions:  Procedures/Studies: ECHOCARDIOGRAM COMPLETE Result Date: 08/05/2024    ECHOCARDIOGRAM REPORT   Patient Name:   MAHESH SIZEMORE Date of Exam: 08/05/2024 Medical Rec #:  [id number]     Height:       66.0 in Accession #:    [id number]    Weight:       144.8 lb Date of Birth:  Jul 30, 1946     BSA:          1.744 m Patient Age:    78 years      BP:           126/78 mmHg Patient Gender: M             HR:           80 bpm. Exam Location:  [facility] Procedure: 2D Echo, Cardiac Doppler and Color Doppler (Both Spectral and Color            Flow Doppler were utilized during procedure). Indications:    CHF-Acute Diastolic I50.31  History:        Patient has prior history of Echocardiogram examinations, most                 recent 05/15/2023. CAD and Previous Myocardial Infarction, COPD,                 Arrythmias:Atrial Fibrillation, Signs/Symptoms:Syncope; Risk                 Factors:Dyslipidemia and Hypertension. ETOH.  Sonographer:    Aida Pizza RCS Referring Phys: [phone] DAVID TAT IMPRESSIONS  1. Left ventricular ejection fraction, by estimation, is 50%. Left ventricular ejection fraction by 3D volume is 51 %. The left ventricle has normal function. Left ventricular endocardial border not optimally defined to evaluate regional wall motion. Left ventricular diastolic function could not be evaluated.  2. Right ventricular systolic function was not well visualized. The right ventricular size is normal. There is normal pulmonary artery systolic pressure.  3. Left atrial size was mildly dilated.  4. The mitral valve is normal in structure. Mild mitral valve regurgitation. No evidence of mitral stenosis.  5. The aortic valve is tricuspid. Aortic valve regurgitation is not visualized. No aortic stenosis is present.  6. The inferior vena cava is  normal in size with greater than 50% respiratory variability, suggesting right atrial pressure of  3 mmHg. Comparison(s): No significant change from prior study. FINDINGS  Left Ventricle: Left ventricular ejection fraction, by estimation, is 50%. Left ventricular ejection fraction by 3D volume is 51 %. The left ventricle has normal function. Left ventricular endocardial border not optimally defined to evaluate regional wall motion. Strain was performed and the global longitudinal strain is indeterminate. The left ventricular internal cavity size was normal in size. There is no left ventricular hypertrophy. Left ventricular diastolic function could not be evaluated due to atrial fibrillation. Left ventricular diastolic function could not be evaluated. Right Ventricle: The right ventricular size is normal. No increase in right ventricular wall thickness. Right ventricular systolic function was not well visualized. There is normal pulmonary artery systolic pressure. The tricuspid regurgitant velocity is  2.17 m/s, and with an assumed right atrial pressure of 3 mmHg, the estimated right ventricular systolic pressure is 21.8 mmHg. Left Atrium: Left atrial size was mildly dilated. Right Atrium: Right atrial size was normal in size. Pericardium: There is no evidence of pericardial effusion. Mitral Valve: The mitral valve is normal in structure. Mild mitral valve regurgitation. No evidence of mitral valve stenosis. Tricuspid Valve: The tricuspid valve is normal in structure. Tricuspid valve regurgitation is mild . No evidence of tricuspid stenosis. Aortic Valve: The aortic valve is tricuspid. Aortic valve regurgitation is not visualized. No aortic stenosis is present. Pulmonic Valve: The pulmonic valve was not well visualized. Pulmonic valve regurgitation is trivial. No evidence of pulmonic stenosis. Aorta: The aortic root is normal in size and structure. Venous: The inferior vena cava is normal in size with greater than 50% respiratory variability, suggesting right atrial pressure of 3 mmHg. IAS/Shunts: No atrial level  shunt detected by color flow Doppler. Additional Comments: 3D was performed not requiring image post processing on an independent workstation and was abnormal.  LEFT VENTRICLE PLAX 2D LVIDd:         4.40 cm LVIDs:         3.55 cm LV PW:         0.90 cm         3D Volume EF LV IVS:        1.10 cm         LV 3D EF:    Left LVOT diam:     2.00 cm                      ventricul LV SV:         41                           ar LV SV Index:   23                           ejection LVOT Area:     3.14 cm                     fraction                                             by 3D  volume is                                             51 %.                                 3D Volume EF:                                3D EF:        51 %                                LV EDV:       79 ml                                LV ESV:       38 ml                                LV SV:        40 ml RIGHT VENTRICLE RV S prime:     5.84 cm/s TAPSE (M-mode): 1.4 cm LEFT ATRIUM           Index        RIGHT ATRIUM           Index LA diam:      4.40 cm 2.52 cm/m   RA Area:     17.50 cm LA Vol (A2C): 60.6 ml 34.76 ml/m  RA Volume:   50.40 ml  28.91 ml/m LA Vol (A4C): 48.8 ml 27.99 ml/m  AORTIC VALVE LVOT Vmax:   77.20 cm/s LVOT Vmean:  50.100 cm/s LVOT VTI:    0.130 m  AORTA Ao Root diam: 3.30 cm MITRAL VALVE                TRICUSPID VALVE MV Area (PHT): 6.96 cm     TR Peak grad:   18.8 mmHg MV Decel Time: 109 msec     TR Vmax:        217.00 cm/s MV E velocity: 100.00 cm/s                             SHUNTS                             Systemic VTI:  0.13 m                             Systemic Diam: 2.00 cm Vishnu Priya Mallipeddi Electronically signed by Diannah Late Mallipeddi Signature Date/Time: 08/05/2024/4:21:06 PM    Final    CT CHEST WO CONTRAST Result Date: 08/05/2024 CLINICAL DATA:  Acute respiratory failure with hypoxia. Increased interstitial and airspace disease in the upper lobes  bilaterally on recent chest radiographs. EXAM: CT CHEST WITHOUT CONTRAST TECHNIQUE: Multidetector CT imaging of the chest was performed following the standard protocol without IV contrast. RADIATION DOSE REDUCTION: This exam  was performed according to the departmental dose-optimization program which includes automated exposure control, adjustment of the mA and/or kV according to patient size and/or use of iterative reconstruction technique. COMPARISON:  Chest radiographs dated 08/03/2024 and 07/31/2024. Chest CT dated 04/14/2024. FINDINGS: Cardiovascular: Atheromatous calcifications, including the coronary arteries and aorta. Post CABG changes. Normal-sized heart. No pericardial effusion. Mediastinum/Nodes: No enlarged mediastinal or axillary lymph nodes. Thyroid  gland, trachea, and esophagus demonstrate no significant findings. Lungs/Pleura: Interval extensive ground-glass interstitial opacity throughout the majority of both upper lobes with underlying centrilobular emphysematous changes. Interval minimal ground-glass interstitial prominence in the right middle lobe. Moderate to marked diffuse peribronchial thickening with mild progression. Interval large number of small patchy densities in both lower lobes, predominantly in the dependent portions including multiple new 1-3 mm nodular densities. No pleural fluid. Upper Abdomen: Atheromatous arterial calcifications. Stable 1.5 cm rounded, circumscribed, exophytic, low-density right renal mass measuring 24 Hounsfield units in density, compatible with a mildly complicated cyst not needing imaging follow-up. Atheromatous arterial calcifications. Musculoskeletal: Thoracic and lower cervical spine degenerative changes and cervical spine fixation hardware. Median sternotomy wires. IMPRESSION: 1. Interval extensive ground-glass interstitial opacity throughout the majority of both upper lobes with underlying centrilobular emphysematous changes. This is most likely due to an  infectious process. 2. Interval large number of small patchy densities in both lower lobes, predominantly in the dependent portions including multiple new 1-3 mm nodular densities. These are most likely due to an infectious process, including the possibility of small septic emboli. 3. Moderate to marked diffuse bronchitic changes with mild progression. 4. Calcific coronary artery and aortic atherosclerosis. Aortic Atherosclerosis (ICD10-I70.0) and Emphysema (ICD10-J43.9). Electronically Signed   By: Elspeth Bathe M.D.   On: 08/05/2024 15:11   DG CHEST PORT 1 VIEW Result Date: 08/03/2024 CLINICAL DATA:  Dyspnea, COPD exacerbation, respiratory distress. EXAM: PORTABLE CHEST 1 VIEW COMPARISON:  07/31/2024. FINDINGS: The heart size and mediastinal contours are stable. There is atherosclerotic calcification of the aorta. Chronic interstitial thickening is noted bilaterally. Increased interstitial and airspace disease is noted bilaterally with an upper lobe predominance as compared to the previous exam. No consolidation, effusion, or pneumothorax is seen. There is evidence of prior cardiothoracic surgery with midline sternotomy wires. Cervical spinal fusion hardware is present. No acute osseous abnormality. IMPRESSION: 1. Increased interstitial and airspace disease in the upper lobes bilaterally, possible edema or infiltrate. 2. Chronic interstitial changes. . Electronically Signed   By: Leita Birmingham M.D.   On: 08/03/2024 17:30   DG Chest Port 1 View Result Date: 07/31/2024 CLINICAL DATA:  Weakness and shortness of breath. EXAM: PORTABLE CHEST 1 VIEW COMPARISON:  May 14, 2023 FINDINGS: Multiple sternal wires and vascular clips are seen. The heart size and mediastinal contours are within normal limits. Mild, diffuse, chronic appearing increased interstitial lung markings are noted. No focal consolidation, pleural effusion or pneumothorax is identified. Postoperative changes are seen involving the lower cervical  spine. Chronic right rib fractures are present. No acute osseous abnormalities are identified. IMPRESSION: 1. Evidence of prior median sternotomy/CABG. 2. No acute or active cardiopulmonary disease. Electronically Signed   By: Suzen Dials M.D.   On: 07/31/2024 13:16    Eric Nunnery, MD  [facility]  If 7PM-7AM, please contact night-coverage [email or web address] Password [email or web address] 08/06/2024, 5:36 PM   LOS: 6 days   "

## 2024-08-06 NOTE — Progress Notes (Signed)
 Pt is alert and fully oriented x 4, able to ambulate independently in his room. His wife is at bedside very supportive. Pt is afebrile, stable hemodynamically, Atrial flutter on the monitor, on 3 LPM of NCL, normal respiratory effort, no acute distress noted overnight. Pt is able to rest and sleep well with no major complaints. Plan of care is reviewed. Pt has been progressing. We will continue to monitor.  Problem: Clinical Measurements: Goal: Ability to maintain clinical measurements within normal limits will improve Outcome: Progressing Goal: Will remain free from infection Outcome: Progressing Goal: Diagnostic test results will improve Outcome: Progressing Goal: Respiratory complications will improve Outcome: Progressing Goal: Cardiovascular complication will be avoided Outcome: Progressing   Wendi Dash, RN

## 2024-08-06 NOTE — Progress Notes (Signed)
 PHARMACY - ANTICOAGULATION CONSULT NOTE  Pharmacy Consult for warfarin Indication: atrial fibrillation  Allergies[1]  Patient Measurements: Height: 5' 6 (167.6 cm) Weight: 65.7 kg (144 lb 13.5 oz) IBW/kg (Calculated) : 63.8 HEPARIN  DW (KG): 65.7  Vital Signs: Temp: 97.8 F (36.6 C) (12/31 0607) Temp Source: Oral (12/31 0607) BP: 134/78 (12/31 0817) Pulse Rate: 90 (12/31 0817)  Labs: Recent Labs    08/04/24 0320 08/05/24 0438 08/06/24 0445  HGB 12.6*  --   --   HCT 37.9*  --   --   PLT 179  --   --   LABPROT 42.8* 36.2* 26.4*  INR 4.3* 3.4* 2.3*  CREATININE 1.14 1.06 1.06    Estimated Creatinine Clearance: 53.5 mL/min (by C-G formula based on SCr of 1.06 mg/dL).   Medical History: Past Medical History:  Diagnosis Date   Anginal pain    Atrial fibrillation by electrocardiogram (HCC) 11/2019   Atrial flutter by electrocardiogram [facility]) 11/2019   Bladder cancer (HCC) 01/2020   Bladder tumor    BPH (benign prostatic hyperplasia)    CAD (coronary artery disease)    a. s/p INF MI 1997;  b. s/p CABG 2001;  c. LHC 11/2009:  3v CAD, S-PDA ok with 40% mid, S-OM ok, S-Dx ok, L-LAD ok, EF 50%;  d.  Lex MV 5/14:  Inferolateral scar, EF 46%, no ischemia   COPD (chronic obstructive pulmonary disease) (HCC)    Dyspnea    ETOH abuse    GERD (gastroesophageal reflux disease)    History of gout    Hypercholesterolemia    Hypertension    Melanoma (HCC)    Melanoma in situ (HCC) 05/21/2024   right lower back-- Tx Mohs Dr. Corey 07/01/2024   Myocardial infarction [facility]) 2013   Prostate cancer [facility])    Tobacco abuse     Assessment: 78 year old male on warfarin prior to admit for afib. INR 1.8 on admit this afternoon. Patient takes 5mg  on Tuesdays, Thursdays, and Saturdays, 2.5mg  all other days.   INR trending back down today from 2.8>5.2>4.3>3.4> 2.3 this morning , no bleeding issues noted. CBC stable.   Goal of Therapy:  INR 2-3 Monitor platelets by anticoagulation  protocol: Yes   Plan:  Warfarin 2.5 mg x 1 dose Daily INR for now  Elspeth Sour, PharmD Clinical Pharmacist 08/06/2024 12:18 PM            [1] No Known Allergies

## 2024-08-07 DIAGNOSIS — Z720 Tobacco use: Secondary | ICD-10-CM | POA: Diagnosis not present

## 2024-08-07 DIAGNOSIS — J441 Chronic obstructive pulmonary disease with (acute) exacerbation: Secondary | ICD-10-CM | POA: Diagnosis not present

## 2024-08-07 DIAGNOSIS — J111 Influenza due to unidentified influenza virus with other respiratory manifestations: Secondary | ICD-10-CM

## 2024-08-07 DIAGNOSIS — J9601 Acute respiratory failure with hypoxia: Secondary | ICD-10-CM | POA: Diagnosis not present

## 2024-08-07 DIAGNOSIS — K219 Gastro-esophageal reflux disease without esophagitis: Secondary | ICD-10-CM

## 2024-08-07 DIAGNOSIS — I4819 Other persistent atrial fibrillation: Secondary | ICD-10-CM | POA: Diagnosis not present

## 2024-08-07 LAB — BASIC METABOLIC PANEL WITH GFR
Anion gap: 10 (ref 5–15)
BUN: 48 mg/dL — ABNORMAL HIGH (ref 8–23)
CO2: 31 mmol/L (ref 22–32)
Calcium: 9.1 mg/dL (ref 8.9–10.3)
Chloride: 97 mmol/L — ABNORMAL LOW (ref 98–111)
Creatinine, Ser: 0.99 mg/dL (ref 0.61–1.24)
GFR, Estimated: >60 mL/min
Glucose, Bld: 187 mg/dL — ABNORMAL HIGH (ref 70–99)
Potassium: 4.1 mmol/L (ref 3.5–5.1)
Sodium: 138 mmol/L (ref 135–145)

## 2024-08-07 LAB — PROTIME-INR
INR: 2 — ABNORMAL HIGH (ref 0.8–1.2)
Prothrombin Time: 23.9 s — ABNORMAL HIGH (ref 11.4–15.2)

## 2024-08-07 MED ORDER — FUROSEMIDE 20 MG PO TABS: 20.0000 mg | ORAL_TABLET | Freq: Every day | ORAL | Status: DC

## 2024-08-07 MED ORDER — WARFARIN SODIUM 5 MG PO TABS: 5.0000 mg | ORAL_TABLET | Freq: Once | ORAL | Status: DC

## 2024-08-07 MED ORDER — DOXYCYCLINE HYCLATE 100 MG PO TABS: 100.0000 mg | ORAL_TABLET | Freq: Two times a day (BID) | ORAL | 0 refills | Status: AC

## 2024-08-07 MED ORDER — ACETAMINOPHEN 500 MG PO TABS: 1000.0000 mg | ORAL_TABLET | Freq: Three times a day (TID) | ORAL | Status: AC | PRN

## 2024-08-07 MED ORDER — PREDNISONE 20 MG PO TABS: ORAL_TABLET | ORAL | 0 refills | Status: AC

## 2024-08-07 NOTE — Plan of Care (Signed)

## 2024-08-07 NOTE — Plan of Care (Signed)
" °  Problem: Education: Goal: Knowledge of General Education information will improve Description: Including pain rating scale, medication(s)/side effects and non-pharmacologic comfort measures 08/07/2024 1240 by Mathews Norleen POUR, RN Outcome: Adequate for Discharge 08/07/2024 1035 by Mathews Norleen POUR, RN Outcome: Progressing   Problem: Health Behavior/Discharge Planning: Goal: Ability to manage health-related needs will improve 08/07/2024 1240 by Mathews Norleen POUR, RN Outcome: Adequate for Discharge 08/07/2024 1035 by Mathews Norleen POUR, RN Outcome: Progressing   Problem: Clinical Measurements: Goal: Ability to maintain clinical measurements within normal limits will improve 08/07/2024 1240 by Mathews Norleen POUR, RN Outcome: Adequate for Discharge 08/07/2024 1035 by Mathews Norleen POUR, RN Outcome: Progressing Goal: Will remain free from infection 08/07/2024 1240 by Mathews Norleen POUR, RN Outcome: Adequate for Discharge 08/07/2024 1035 by Mathews Norleen POUR, RN Outcome: Progressing Goal: Diagnostic test results will improve 08/07/2024 1240 by Mathews Norleen POUR, RN Outcome: Adequate for Discharge 08/07/2024 1035 by Mathews Norleen POUR, RN Outcome: Progressing Goal: Respiratory complications will improve 08/07/2024 1240 by Mathews Norleen POUR, RN Outcome: Adequate for Discharge 08/07/2024 1035 by Mathews Norleen POUR, RN Outcome: Progressing Goal: Cardiovascular complication will be avoided 08/07/2024 1240 by Mathews Norleen POUR, RN Outcome: Adequate for Discharge 08/07/2024 1035 by Mathews Norleen POUR, RN Outcome: Progressing   Problem: Activity: Goal: Risk for activity intolerance will decrease 08/07/2024 1240 by Mathews Norleen POUR, RN Outcome: Adequate for Discharge 08/07/2024 1035 by Mathews Norleen POUR, RN Outcome: Progressing   Problem: Nutrition: Goal: Adequate nutrition will be maintained 08/07/2024 1240 by Mathews Norleen POUR, RN Outcome: Adequate for Discharge 08/07/2024 1035 by Mathews Norleen POUR, RN Outcome: Progressing   Problem: Coping: Goal: Level of anxiety will  decrease 08/07/2024 1240 by Mathews Norleen POUR, RN Outcome: Adequate for Discharge 08/07/2024 1035 by Mathews Norleen POUR, RN Outcome: Progressing   Problem: Elimination: Goal: Will not experience complications related to bowel motility 08/07/2024 1240 by Mathews Norleen POUR, RN Outcome: Adequate for Discharge 08/07/2024 1035 by Mathews Norleen POUR, RN Outcome: Progressing Goal: Will not experience complications related to urinary retention 08/07/2024 1240 by Mathews Norleen POUR, RN Outcome: Adequate for Discharge 08/07/2024 1035 by Mathews Norleen POUR, RN Outcome: Progressing   Problem: Pain Managment: Goal: General experience of comfort will improve and/or be controlled 08/07/2024 1240 by Mathews Norleen POUR, RN Outcome: Adequate for Discharge 08/07/2024 1035 by Mathews Norleen POUR, RN Outcome: Progressing   Problem: Safety: Goal: Ability to remain free from injury will improve 08/07/2024 1240 by Mathews Norleen POUR, RN Outcome: Adequate for Discharge 08/07/2024 1035 by Mathews Norleen POUR, RN Outcome: Progressing   Problem: Skin Integrity: Goal: Risk for impaired skin integrity will decrease 08/07/2024 1240 by Mathews Norleen POUR, RN Outcome: Adequate for Discharge 08/07/2024 1035 by Mathews Norleen POUR, RN Outcome: Progressing   "

## 2024-08-07 NOTE — Discharge Summary (Signed)
 " Physician Discharge Summary   Patient: [name] MRN: [id number] DOB: 18-Dec-1945  Admit date:     07/31/2024  Discharge date: 08/07/2024  Discharge Physician: [name]   PCP: Ralph Lowers, MD   Recommendations at discharge:  Repeat basic metabolic panel to follow electrolytes renal function Reassess blood pressure and adjust antihypertensive treatment as needed Repeat CBC to follow hemoglobin trend/stability Please closely follow INR level and further adjust Coumadin  dosage as needed.   Discharge Diagnoses: Principal Problem:   Acute respiratory failure with hypoxia (HCC) Active Problems:   Mixed hyperlipidemia   Tobacco abuse   Persistent atrial fibrillation (HCC)   Chronic heart failure with mildly reduced ejection fraction (HFmrEF) (HCC)   COPD with acute exacerbation (HCC)   COPD exacerbation (HCC)   Acute on chronic heart failure with mildly reduced ejection fraction (HFmrEF) (HCC)   Influenza  Brief History:  79 male with a history of COPD, coronary disease status post MI, hypertension, hyperlipidemia, tobacco abuse, cardiothoracic, persistent atrial fibrillation on warfarin presenting with 1 day history of shortness of breath and worsening cough.  The patient states that he began having worsening shortness of breath and wheezing on the evening of 07/30/2024.  He has a largely nonproductive cough.  He denies any fevers, chills, chest pain, abdominal pain, nausea, vomiting, diarrhea, hematochezia, melena.  There is no dysuria or hematuria.  He continues to smoke 1 pack/day.  He has a 60-pack-year history.  He denies any worsening lower extremity edema or orthopnea or increasing abdominal girth.  He states that he has lost a few pounds in the past few months.  His last weight was 138 pounds about a week prior to this admission. In the ED, the patient was afebrile and hemodynamically stable with oxygen saturation of 86% on room air.  He was placed on 3 L with saturation of  95%.  WBC 9.1, hemoglobin 13.2, platelet 175.  Sodium 136, potassium 4.0, bicarbonate 21, serum creatinine 1.02.  AST 30, ALT 20, alk phosphatase 109, total bilirubin 1.1.  Albumin 4.0.  Chest x-ray showed chronic interstitial markings.  Patient was started on bronchodilators.  He was given a dose of furosemide  in the emergency department.  The patient was given Solu-Medrol  by EMS prior to arrival.    Assessment and Plan: Acute respiratory failure with hypoxia - Secondary to influenza, mild CHF and COPD exacerbation - Stable on 2-3 L currently - Continue to wean off oxygen supplementation as tolerated and check desaturation screening. - 12/28 repeat CXR--personally reviewed>>increased interstitial markings - restart IV lasix  - 12/29 CT chest--. Interval extensive ground-glass interstitial opacity throughout the majority of both upper lobes;   - Blood culture without growth at time of discharge - Patient significantly improved and no requiring oxygen at discharge - He has completed management with Tamiflu  while inpatient; will complete 2 more days of oral antibiotics along with steroids tapering as part of COPD exacerbation. - Patient's diuretic dosages has been adjusted.  2D echo demonstrating preserved ejection fraction (50 to 51%); unfortunately images not ultimately defined to evaluate regional wall motion or to evaluate diastolic function. -Patient advised to maintain adequate hydration and to follow daily [name]/low-sodium diet.   COPD exacerbation - Continue home bronchodilator management - Steroids tapering as mentioned above - Complete oral antibiotic - Outpatient follow-up with PCP and pulmonology service recommended.   Supratherapeutic INR -INR 5.2>>4.3>> 3.4>> 2.3 -no signs of bleeding presently - Continue to follow INR and further adjust Coumadin  dosage as needed. -  At discharge INR 2.0   Persistent atrial fibrillation - Continue metoprolol  succinate - Continue warfarin for  secondary prevention - rate controlled and patient denying palpitations.   Coronary artery disease - No chest pain presently -- 03/27/22 cath as reported below, PCI to SVG-OM1  - History of MI 1997 status post CABG - Continue metoprolol  succinate and statin   Acute on Chronic HFimpEF -Repeat echo demonstrating ejection fraction 50-51 percent - Resume home GDMT management and daily Lasix  20 mg. - Continue daily [name], adequate hydration and low-sodium diet - Continue outpatient follow-up with PCP and cardiology service.   GERD - Continue PPI   Melanoma - Patient follows dermatology   Hypokalemia -replete -check mag 2.2   Goals of Care -08/06/24 goals of care has been clarified by palliative care service; patient is DNR/DNI. - Continue current management.  Consultants: Palliative care Procedures performed: See below for x-ray reports. Disposition: Home Diet recommendation: Heart healthy/low-sodium diet.  DISCHARGE MEDICATION: Allergies as of 08/07/2024   No Known Allergies      Medication List     TAKE these medications    acetaminophen  500 MG tablet Commonly known as: TYLENOL  Take 2 tablets (1,000 mg total) by mouth every 8 (eight) hours as needed for moderate pain (pain score 4-6), headache, mild pain (pain score 1-3) or fever. What changed:  when to take this reasons to take this   albuterol  108 (90 Base) MCG/ACT inhaler Commonly known as: Ventolin  HFA Inhale 2 puffs into the lungs every 6 (six) hours as needed for wheezing or shortness of breath.   allopurinol  100 MG tablet Commonly known as: ZYLOPRIM  Take 1 tablet (100 mg total) by mouth daily.   atorvastatin  80 MG tablet Commonly known as: LIPITOR  Take 1 tablet (80 mg total) by mouth daily.   azelastine  0.1 % nasal spray Commonly known as: ASTELIN  Place 2 sprays into both nostrils 2 (two) times daily. Use in each nostril as directed   Breztri  Aerosphere 160-9-4.8 MCG/ACT Aero inhaler Generic  drug: budesonide -glycopyrrolate-formoterol  Inhale 2 puffs into the lungs in the morning and at bedtime.   doxycycline  100 MG tablet Commonly known as: VIBRA -TABS Take 1 tablet (100 mg total) by mouth every 12 (twelve) hours for 2 days.   empagliflozin  10 MG Tabs tablet Commonly known as: Jardiance  Take 1 tablet (10 mg total) by mouth daily before breakfast.   fluticasone  50 MCG/ACT nasal spray Commonly known as: FLONASE  Place 2 sprays into both nostrils daily as needed for allergies.   furosemide  20 MG tablet Commonly known as: LASIX  Take 1 tablet (20 mg total) by mouth daily. What changed:  when to take this reasons to take this   magnesium  oxide 400 (240 Mg) MG tablet Commonly known as: MAG-OX Take 1 tablet by mouth daily.   metoprolol  succinate 50 MG 24 hr tablet Commonly known as: TOPROL -XL TAKE 1 AND 1/2 TABLETS BY MOUTH  DAILY   nitroGLYCERIN  0.4 MG SL tablet Commonly known as: NITROSTAT  Place 1 tablet (0.4 mg total) under the tongue every 5 (five) minutes as needed for chest pain.   pantoprazole  40 MG tablet Commonly known as: PROTONIX  TAKE 1 TABLET BY MOUTH DAILY   predniSONE  20 MG tablet Commonly known as: DELTASONE  Take 2 tablets by mouth x 1 day; then 1 tablet by mouth daily x 3 days; then half tablet by mouth daily x 3 days.   spironolactone  25 MG tablet Commonly known as: ALDACTONE  Take 0.5 tablets (12.5 mg total) by mouth 2 (two)  times daily.   VITAMIN B COMPLEX PO Take 1 tablet by mouth daily.   Vitamin D3 125 MCG (5000 UT) Caps Take 5,000 Units by mouth daily.   warfarin 5 MG tablet Commonly known as: COUMADIN  Take as directed. If you are unsure how to take this medication, talk to your nurse or doctor. Original instructions: TAKE 1 TABLET BY MOUTH DAILY What changed:  when to take this additional instructions        Follow-up Information     Ralph Lowers, MD. Schedule an appointment as soon as possible for a visit in 10 day(s).    Specialty: Family Medicine Contact information: [address] [place] [address] [phone]                Discharge Exam: [name]   07/31/24 1241  Weight: 65.7 kg   General exam: Alert, awake, oriented x 3; demonstrating good saturation on room air and speaking in full sentences. Respiratory system: Improved air movement bilaterally; no using accessory muscles. Cardiovascular system: Rate controlled, no rubs, no gallops, no JVD. Gastrointestinal system: Abdomen is nondistended, soft and nontender. No organomegaly or masses felt. Normal bowel sounds heard. Central nervous system: No focal neurological deficits. Extremities: No cyanosis or clubbing. Skin: No petechiae. Psychiatry: Judgement and insight appear normal. Mood & affect appropriate.    Condition at discharge: Stable and improved.  The results of significant diagnostics from this hospitalization (including imaging, microbiology, ancillary and laboratory) are listed below for reference.   Imaging Studies: ECHOCARDIOGRAM COMPLETE Result Date: 08/05/2024    ECHOCARDIOGRAM REPORT   Patient Name:   [name] Date of Exam: 08/05/2024 Medical Rec #:  [id number]     Height:       66.0 in Accession #:    [id number]    Weight:       144.8 lb Date of Birth:  30-Jan-1946     BSA:          1.744 m Patient Age:    78 years      BP:           126/78 mmHg Patient Gender: M             HR:           80 bpm. Exam Location:  [facility] Procedure: 2D Echo, Cardiac Doppler and Color Doppler (Both Spectral and Color            Flow Doppler were utilized during procedure). Indications:    CHF-Acute Diastolic I50.31  History:        Patient has prior history of Echocardiogram examinations, most                 recent 05/15/2023. CAD and Previous Myocardial Infarction, COPD,                 Arrythmias:Atrial Fibrillation, Signs/Symptoms:Syncope; Risk                 Factors:Dyslipidemia and Hypertension. ETOH.  Sonographer:     Aida Pizza RCS Referring Phys: [phone] DAVID TAT IMPRESSIONS  1. Left ventricular ejection fraction, by estimation, is 50%. Left ventricular ejection fraction by 3D volume is 51 %. The left ventricle has normal function. Left ventricular endocardial border not optimally defined to evaluate regional wall motion. Left ventricular diastolic function could not be evaluated.  2. Right ventricular systolic function was not well visualized. The right ventricular size is normal. There is normal pulmonary artery systolic pressure.  3. Left atrial size was mildly dilated.  4. The mitral valve is normal in structure. Mild mitral valve regurgitation. No evidence of mitral stenosis.  5. The aortic valve is tricuspid. Aortic valve regurgitation is not visualized. No aortic stenosis is present.  6. The inferior vena cava is normal in size with greater than 50% respiratory variability, suggesting right atrial pressure of 3 mmHg. Comparison(s): No significant change from prior study. FINDINGS  Left Ventricle: Left ventricular ejection fraction, by estimation, is 50%. Left ventricular ejection fraction by 3D volume is 51 %. The left ventricle has normal function. Left ventricular endocardial border not optimally defined to evaluate regional wall motion. Strain was performed and the global longitudinal strain is indeterminate. The left ventricular internal cavity size was normal in size. There is no left ventricular hypertrophy. Left ventricular diastolic function could not be evaluated due to atrial fibrillation. Left ventricular diastolic function could not be evaluated. Right Ventricle: The right ventricular size is normal. No increase in right ventricular wall thickness. Right ventricular systolic function was not well visualized. There is normal pulmonary artery systolic pressure. The tricuspid regurgitant velocity is  2.17 m/s, and with an assumed right atrial pressure of 3 mmHg, the estimated right ventricular systolic pressure  is 21.8 mmHg. Left Atrium: Left atrial size was mildly dilated. Right Atrium: Right atrial size was normal in size. Pericardium: There is no evidence of pericardial effusion. Mitral Valve: The mitral valve is normal in structure. Mild mitral valve regurgitation. No evidence of mitral valve stenosis. Tricuspid Valve: The tricuspid valve is normal in structure. Tricuspid valve regurgitation is mild . No evidence of tricuspid stenosis. Aortic Valve: The aortic valve is tricuspid. Aortic valve regurgitation is not visualized. No aortic stenosis is present. Pulmonic Valve: The pulmonic valve was not well visualized. Pulmonic valve regurgitation is trivial. No evidence of pulmonic stenosis. Aorta: The aortic root is normal in size and structure. Venous: The inferior vena cava is normal in size with greater than 50% respiratory variability, suggesting right atrial pressure of 3 mmHg. IAS/Shunts: No atrial level shunt detected by color flow Doppler. Additional Comments: 3D was performed not requiring image post processing on an independent workstation and was abnormal.  LEFT VENTRICLE PLAX 2D LVIDd:         4.40 cm LVIDs:         3.55 cm LV PW:         0.90 cm         3D Volume EF LV IVS:        1.10 cm         LV 3D EF:    Left LVOT diam:     2.00 cm                      ventricul LV SV:         41                           ar LV SV Index:   23                           ejection LVOT Area:     3.14 cm                     fraction  by 3D                                             volume is                                             51 %.                                 3D Volume EF:                                3D EF:        51 %                                LV EDV:       79 ml                                LV ESV:       38 ml                                LV SV:        40 ml RIGHT VENTRICLE RV S prime:     5.84 cm/s TAPSE (M-mode): 1.4 cm LEFT ATRIUM           Index         RIGHT ATRIUM           Index LA diam:      4.40 cm 2.52 cm/m   RA Area:     17.50 cm LA Vol (A2C): 60.6 ml 34.76 ml/m  RA Volume:   50.40 ml  28.91 ml/m LA Vol (A4C): 48.8 ml 27.99 ml/m  AORTIC VALVE LVOT Vmax:   77.20 cm/s LVOT Vmean:  50.100 cm/s LVOT VTI:    0.130 m  AORTA Ao Root diam: 3.30 cm MITRAL VALVE                TRICUSPID VALVE MV Area (PHT): 6.96 cm     TR Peak grad:   18.8 mmHg MV Decel Time: 109 msec     TR Vmax:        217.00 cm/s MV E velocity: 100.00 cm/s                             SHUNTS                             Systemic VTI:  0.13 m                             Systemic Diam: 2.00 cm Vishnu Priya Mallipeddi Electronically signed by Diannah Late Mallipeddi Signature Date/Time: 08/05/2024/4:21:06 PM    Final    CT CHEST WO CONTRAST Result Date: 08/05/2024 CLINICAL DATA:  Acute respiratory failure with hypoxia. Increased interstitial and airspace disease in the upper lobes bilaterally on recent chest radiographs. EXAM: CT CHEST WITHOUT CONTRAST TECHNIQUE: Multidetector CT imaging of the chest was performed following the standard protocol without IV contrast. RADIATION DOSE REDUCTION: This exam was performed according to the departmental dose-optimization program which includes automated exposure control, adjustment of the mA and/or kV according to patient size and/or use of iterative reconstruction technique. COMPARISON:  Chest radiographs dated 08/03/2024 and 07/31/2024. Chest CT dated 04/14/2024. FINDINGS: Cardiovascular: Atheromatous calcifications, including the coronary arteries and aorta. Post CABG changes. Normal-sized heart. No pericardial effusion. Mediastinum/Nodes: No enlarged mediastinal or axillary lymph nodes. Thyroid  gland, trachea, and esophagus demonstrate no significant findings. Lungs/Pleura: Interval extensive ground-glass interstitial opacity throughout the majority of both upper lobes with underlying centrilobular emphysematous changes. Interval minimal  ground-glass interstitial prominence in the right middle lobe. Moderate to marked diffuse peribronchial thickening with mild progression. Interval large number of small patchy densities in both lower lobes, predominantly in the dependent portions including multiple new 1-3 mm nodular densities. No pleural fluid. Upper Abdomen: Atheromatous arterial calcifications. Stable 1.5 cm rounded, circumscribed, exophytic, low-density right renal mass measuring 24 Hounsfield units in density, compatible with a mildly complicated cyst not needing imaging follow-up. Atheromatous arterial calcifications. Musculoskeletal: Thoracic and lower cervical spine degenerative changes and cervical spine fixation hardware. Median sternotomy wires. IMPRESSION: 1. Interval extensive ground-glass interstitial opacity throughout the majority of both upper lobes with underlying centrilobular emphysematous changes. This is most likely due to an infectious process. 2. Interval large number of small patchy densities in both lower lobes, predominantly in the dependent portions including multiple new 1-3 mm nodular densities. These are most likely due to an infectious process, including the possibility of small septic emboli. 3. Moderate to marked diffuse bronchitic changes with mild progression. 4. Calcific coronary artery and aortic atherosclerosis. Aortic Atherosclerosis (ICD10-I70.0) and Emphysema (ICD10-J43.9). Electronically Signed   By: Elspeth Bathe M.D.   On: 08/05/2024 15:11   DG CHEST PORT 1 VIEW Result Date: 08/03/2024 CLINICAL DATA:  Dyspnea, COPD exacerbation, respiratory distress. EXAM: PORTABLE CHEST 1 VIEW COMPARISON:  07/31/2024. FINDINGS: The heart size and mediastinal contours are stable. There is atherosclerotic calcification of the aorta. Chronic interstitial thickening is noted bilaterally. Increased interstitial and airspace disease is noted bilaterally with an upper lobe predominance as compared to the previous exam. No  consolidation, effusion, or pneumothorax is seen. There is evidence of prior cardiothoracic surgery with midline sternotomy wires. Cervical spinal fusion hardware is present. No acute osseous abnormality. IMPRESSION: 1. Increased interstitial and airspace disease in the upper lobes bilaterally, possible edema or infiltrate. 2. Chronic interstitial changes. . Electronically Signed   By: Leita Birmingham M.D.   On: 08/03/2024 17:30   DG Chest Port 1 View Result Date: 07/31/2024 CLINICAL DATA:  Weakness and shortness of breath. EXAM: PORTABLE CHEST 1 VIEW COMPARISON:  May 14, 2023 FINDINGS: Multiple sternal wires and vascular clips are seen. The heart size and mediastinal contours are within normal limits. Mild, diffuse, chronic appearing increased interstitial lung markings are noted. No focal consolidation, pleural effusion or pneumothorax is identified. Postoperative changes are seen involving the lower cervical spine. Chronic right rib fractures are present. No acute osseous abnormalities are identified. IMPRESSION: 1. Evidence of prior median sternotomy/CABG. 2. No acute or active cardiopulmonary disease. Electronically Signed   By: Suzen Dials M.D.   On: 07/31/2024 13:16    Microbiology: Results for orders placed or performed during the hospital encounter  of 07/31/24  Resp panel by RT-PCR (RSV, Flu A&B, Covid) Anterior Nasal Swab     Status: Abnormal   Collection Time: 07/31/24 12:47 PM   Specimen: Anterior Nasal Swab  Result Value Ref Range Status   SARS Coronavirus 2 by RT PCR NEGATIVE NEGATIVE Final    Comment: (NOTE) SARS-CoV-2 target nucleic acids are NOT DETECTED.  The SARS-CoV-2 RNA is generally detectable in upper respiratory specimens during the acute phase of infection. The lowest concentration of SARS-CoV-2 viral copies this assay can detect is 138 copies/mL. A negative result does not preclude SARS-Cov-2 infection and should not be used as the sole basis for treatment  or other patient management decisions. A negative result may occur with  improper specimen collection/handling, submission of specimen other than nasopharyngeal swab, presence of viral mutation(s) within the areas targeted by this assay, and inadequate number of viral copies(<138 copies/mL). A negative result must be combined with clinical observations, patient history, and epidemiological information. The expected result is Negative.  Fact Sheet for Patients:  bloggercourse.com  Fact Sheet for Healthcare Providers:  seriousbroker.it  This test is no t yet approved or cleared by the [place]  FDA and  has been authorized for detection and/or diagnosis of SARS-CoV-2 by FDA under an Emergency Use Authorization (EUA). This EUA will remain  in effect (meaning this test can be used) for the duration of the COVID-19 declaration under Section 564(b)(1) of the Act, 21 U.S.C.section 360bbb-3(b)(1), unless the authorization is terminated  or revoked sooner.       Influenza A by PCR POSITIVE (A) NEGATIVE Final   Influenza B by PCR NEGATIVE NEGATIVE Final    Comment: (NOTE) The Xpert Xpress SARS-CoV-2/FLU/RSV plus assay is intended as an aid in the diagnosis of influenza from Nasopharyngeal swab specimens and should not be used as a sole basis for treatment. Nasal washings and aspirates are unacceptable for Xpert Xpress SARS-CoV-2/FLU/RSV testing.  Fact Sheet for Patients: bloggercourse.com  Fact Sheet for Healthcare Providers: seriousbroker.it  This test is not yet approved or cleared by the [place]  FDA and has been authorized for detection and/or diagnosis of SARS-CoV-2 by FDA under an Emergency Use Authorization (EUA). This EUA will remain in effect (meaning this test can be used) for the duration of the COVID-19 declaration under Section 564(b)(1) of the Act, 21  U.S.C. section 360bbb-3(b)(1), unless the authorization is terminated or revoked.     Resp Syncytial Virus by PCR NEGATIVE NEGATIVE Final    Comment: (NOTE) Fact Sheet for Patients: bloggercourse.com  Fact Sheet for Healthcare Providers: seriousbroker.it  This test is not yet approved or cleared by the [place]  FDA and has been authorized for detection and/or diagnosis of SARS-CoV-2 by FDA under an Emergency Use Authorization (EUA). This EUA will remain in effect (meaning this test can be used) for the duration of the COVID-19 declaration under Section 564(b)(1) of the Act, 21 U.S.C. section 360bbb-3(b)(1), unless the authorization is terminated or revoked.  Performed at [facility], [address], [place], [place] [address]   Culture, blood (Routine X 2) w Reflex to ID Panel     Status: None (Preliminary result)   Collection Time: 08/05/24  4:36 PM   Specimen: BLOOD  Result Value Ref Range Status   Specimen Description BLOOD BLOOD RIGHT HAND  Final   Special Requests   Final    BOTTLES DRAWN AEROBIC ONLY Blood Culture adequate volume   Culture   Final    NO GROWTH 2 DAYS Performed  at [facility], [address] [place], [place] [address]    Report Status PENDING  Incomplete  Culture, blood (Routine X 2) w Reflex to ID Panel     Status: None (Preliminary result)   Collection Time: 08/05/24  4:36 PM   Specimen: BLOOD  Result Value Ref Range Status   Specimen Description BLOOD BLOOD RIGHT WRIST  Final   Special Requests   Final    BOTTLES DRAWN AEROBIC ONLY Blood Culture adequate volume   Culture   Final    NO GROWTH 2 DAYS Performed at [facility], [address], [place], [place] [address]    Report Status PENDING  Incomplete    Labs: CBC: Recent Labs  Lab 07/31/24 1244 08/01/24 0415 08/03/24 0348 08/04/24 0320  WBC 9.1 10.3 11.3* 8.3  NEUTROABS 6.3  --   --   --   HGB 13.2 13.1 12.0* 12.6*  HCT 39.9  38.8* 35.8* 37.9*  MCV 99.0 96.8 97.0 96.4  PLT 176 177 182 179   Basic Metabolic Panel: Recent Labs  Lab 08/02/24 0352 08/03/24 0348 08/04/24 0320 08/05/24 0438 08/06/24 0445 08/07/24 0441  NA 136 137 135 137 136 138  K 3.4* 4.1 3.9 4.0 3.4* 4.1  CL 97* 101 97* 96* 97* 97*  CO2 25 28 27 31  32 31  GLUCOSE 146* 141* 186* 174* 216* 187*  BUN 34* 40* 35* 40* 46* 48*  CREATININE 1.16 1.09 1.14 1.06 1.06 0.99  CALCIUM  9.4 8.8* 8.6* 8.9 8.6* 9.1  MG 2.3 2.2 2.0 2.2 2.3  --    Liver Function Tests: Recent Labs  Lab 07/31/24 1244  AST 30  ALT 20  ALKPHOS 109  BILITOT 1.1  PROT 7.0  ALBUMIN 4.0   CBG: No results for input(s): GLUCAP in the last 168 hours.  Discharge time spent:  35 minutes.  Signed: Eric Nunnery, MD [facility] 08/07/2024 "

## 2024-08-07 NOTE — Progress Notes (Signed)
 PHARMACY - ANTICOAGULATION CONSULT NOTE  Pharmacy Consult for warfarin Indication: atrial fibrillation  Allergies[1]  Patient Measurements: Height: 5' 6 (167.6 cm) Weight: 65.7 kg (144 lb 13.5 oz) IBW/kg (Calculated) : 63.8 HEPARIN  DW (KG): 65.7  Vital Signs: Temp: 97.6 F (36.4 C) (01/01 0420) Temp Source: Oral (01/01 0420) BP: 135/88 (01/01 0420) Pulse Rate: 89 (01/01 0420)  Labs: Recent Labs    08/05/24 0438 08/06/24 0445 08/07/24 0441  LABPROT 36.2* 26.4* 23.9*  INR 3.4* 2.3* 2.0*  CREATININE 1.06 1.06 0.99    Estimated Creatinine Clearance: 57.3 mL/min (by C-G formula based on SCr of 0.99 mg/dL).   Medical History: Past Medical History:  Diagnosis Date   Anginal pain    Atrial fibrillation by electrocardiogram (HCC) 11/2019   Atrial flutter by electrocardiogram [facility]) 11/2019   Bladder cancer (HCC) 01/2020   Bladder tumor    BPH (benign prostatic hyperplasia)    CAD (coronary artery disease)    a. s/p INF MI 1997;  b. s/p CABG 2001;  c. LHC 11/2009:  3v CAD, S-PDA ok with 40% mid, S-OM ok, S-Dx ok, L-LAD ok, EF 50%;  d.  Lex MV 5/14:  Inferolateral scar, EF 46%, no ischemia   COPD (chronic obstructive pulmonary disease) (HCC)    Dyspnea    ETOH abuse    GERD (gastroesophageal reflux disease)    History of gout    Hypercholesterolemia    Hypertension    Melanoma (HCC)    Melanoma in situ (HCC) 05/21/2024   right lower back-- Tx Mohs Dr. Corey 07/01/2024   Myocardial infarction [facility]) 2013   Prostate cancer [facility])    Tobacco abuse     Assessment: 79 year old male on warfarin prior to admit for afib. INR 1.8 on admit this afternoon. Patient takes 5mg  on Tuesdays, Thursdays, and Saturdays, 2.5mg  all other days.   INR trending back down today from 2.8>5.2>4.3>3.4> 2.3> 2 this morning , no bleeding issues noted. CBC stable.   Goal of Therapy:  INR 2-3 Monitor platelets by anticoagulation protocol: Yes   Plan:  Warfarin 5 mg x 1 dose Daily INR for  now  Julyanna Scholle, BS Pharm D, BCPS Clinical Pharmacist 08/07/2024 10:45 AM             [1] No Known Allergies

## 2024-08-08 ENCOUNTER — Telehealth: Payer: Self-pay | Admitting: *Deleted

## 2024-08-08 LAB — PROTIME-INR
INR: 5.2 (ref 0.8–1.2)
Prothrombin Time: 49.7 s — ABNORMAL HIGH (ref 11.4–15.2)

## 2024-08-08 NOTE — Transitions of Care (Post Inpatient/ED Visit) (Signed)
" ° °  08/08/2024  Name: [name] MRN: [id number] DOB: 1946/04/21  Today's TOC FU Call Status: Today's TOC FU Call Status:: Unsuccessful Call (1st Attempt) Unsuccessful Call (1st Attempt) Date: 08/08/24  Attempted to reach the patient regarding the most recent Inpatient/ED visit.  Follow Up Plan: Additional outreach attempts will be made to reach the patient to complete the Transitions of Care (Post Inpatient/ED visit) call.   Mliss Creed [facility], BSN RN Care Manager/ Transition of Care [place]/ [facility] [phone]  "

## 2024-08-10 LAB — CULTURE, BLOOD (ROUTINE X 2)
Culture: NO GROWTH
Culture: NO GROWTH
Special Requests: ADEQUATE
Special Requests: ADEQUATE

## 2024-08-12 ENCOUNTER — Telehealth: Payer: Self-pay

## 2024-08-12 ENCOUNTER — Ambulatory Visit: Admitting: Dermatology

## 2024-08-12 NOTE — Transitions of Care (Post Inpatient/ED Visit) (Deleted)
" ° °  08/12/2024  Name: RENNY REMER MRN: [id number] DOB: 01/22/1946  Today's TOC FU Call Status:    Attempted to reach the patient regarding the most recent Inpatient/ED visit.  Follow Up Plan: Additional outreach attempts will be made to reach the patient to complete the Transitions of Care (Post Inpatient/ED visit) call.    Bing Edison MSN, RN RN [employer]  [facility] Office Hours M-F [phone] Direct Dial: [phone] Main Phone [phone]  Fax: [phone] [email or web address]    "

## 2024-08-12 NOTE — Transitions of Care (Post Inpatient/ED Visit) (Deleted)
 Today's TOC FU Call Status:    Patient's Name and Date of Birth confirmed.    Transition Care Management Follow-up Telephone Call    Items Reviewed:    Medications Reviewed Today: Medications Reviewed Today   Medications were not reviewed in this encounter     Home Care and Equipment/Supplies:    Functional Questionnaire:    Follow up appointments reviewed:      SIGNATURE***

## 2024-08-12 NOTE — Transitions of Care (Post Inpatient/ED Visit) (Signed)
" ° °  08/12/2024  Name: [name] MRN: [id number] DOB: [birth year]  Today's TOC FU Call Status: Today's TOC FU Call Status:: Unsuccessful Call (2nd Attempt) Unsuccessful Call (2nd Attempt) Date: 08/12/24  Attempted to reach the patient regarding the most recent Inpatient/ED visit.  Follow Up Plan: Additional outreach attempts will be made to reach the patient to complete the Transitions of Care (Post Inpatient/ED visit) call.    Bing Edison MSN, RN RN [employer]  [facility] Office Hours M-F [phone] Direct Dial: [phone] Main Phone [phone]  Fax: [phone] [email or web address]    "

## 2024-08-18 ENCOUNTER — Ambulatory Visit: Attending: Cardiology | Admitting: *Deleted

## 2024-08-18 ENCOUNTER — Ambulatory Visit: Admitting: Nurse Practitioner

## 2024-08-18 ENCOUNTER — Encounter: Payer: Self-pay | Admitting: Nurse Practitioner

## 2024-08-18 VITALS — BP 122/70 | HR 75 | Ht 66.0 in | Wt 135.4 lb

## 2024-08-18 DIAGNOSIS — Z87898 Personal history of other specified conditions: Secondary | ICD-10-CM

## 2024-08-18 DIAGNOSIS — I4891 Unspecified atrial fibrillation: Secondary | ICD-10-CM

## 2024-08-18 DIAGNOSIS — Z5181 Encounter for therapeutic drug level monitoring: Secondary | ICD-10-CM | POA: Diagnosis not present

## 2024-08-18 DIAGNOSIS — I5020 Unspecified systolic (congestive) heart failure: Secondary | ICD-10-CM

## 2024-08-18 DIAGNOSIS — J449 Chronic obstructive pulmonary disease, unspecified: Secondary | ICD-10-CM | POA: Diagnosis not present

## 2024-08-18 DIAGNOSIS — I2510 Atherosclerotic heart disease of native coronary artery without angina pectoris: Secondary | ICD-10-CM

## 2024-08-18 DIAGNOSIS — R0609 Other forms of dyspnea: Secondary | ICD-10-CM | POA: Diagnosis not present

## 2024-08-18 DIAGNOSIS — E785 Hyperlipidemia, unspecified: Secondary | ICD-10-CM

## 2024-08-18 DIAGNOSIS — I10 Essential (primary) hypertension: Secondary | ICD-10-CM | POA: Diagnosis not present

## 2024-08-18 DIAGNOSIS — I272 Pulmonary hypertension, unspecified: Secondary | ICD-10-CM

## 2024-08-18 DIAGNOSIS — Z720 Tobacco use: Secondary | ICD-10-CM

## 2024-08-18 LAB — POCT INR: INR: 6 — AB (ref 2.0–3.0)

## 2024-08-18 NOTE — Patient Instructions (Addendum)
 Hold warfarin x 3 days then resume 1/2 tablet daily except 1 tablet on Tuesdays, Thursdays and Saturdays Recheck in 1 week.  Call with any new medications.  Pt denies S/S of bleeding or excessive bruising.  Bleeding and fall precautions discussed with pt/wife and they verbalized understanding.

## 2024-08-18 NOTE — Patient Instructions (Addendum)

## 2024-08-18 NOTE — Progress Notes (Signed)
 "  Office Visit    Patient Name: [name] Date of Encounter: 08/18/2024 PCP:  Zollie Lowers, MD [facility]  Cardiologist:  Alvan Carrier, MD  Advanced Practice Provider:  No care team member to display Electrophysiologist:  None   Chief Complaint and HPI    [name] is a 79 y.o. male with a hx of chronic HFrEF, leg edema, CAD, s/p MI in 28 and CABG, hypertension, hyperlipidemia, A-fib, and history of COPD, who presents today for scheduled 6 month follow-up.  Previous cardiovascular history of prior MI in 63, history of CABG.  Cardiac catheterization in 2011 showed patent grafts.  Myoview in 2014 revealed old inferolateral scar, no ischemia.  Hospital admission in 2023 with atypical chest pain, given his recent drop in EF, cardiac catheterization was pursued.  Report noted below, received PCI to SVG-OM1.  Last seen by Dr. Carrier Alvan on October 13, 2022.  Dr. Alvan stated he was on max tolerated GDMT.  Advised not to try Entresto given his blood pressures that were running soft and his history of hyperkalemia.  Stated Plavix  could be discontinued in August after his last recent stent.  Patient noted some recent cough/wheezing/shortness of breath, was given 5-day course of prednisone  at 40 mg daily, was told to follow-up with pulmonary.  I last saw patient on May 21, 2023 for follow-up shortly after patient was discharged from hospital due to syncopal episodes, patient was found to be hypotensive in the hospital received IV fluids.  Echocardiogram was arranged-see full report below.  Carotid ultrasound revealed bilateral ICA stenosis less than 50%, CT of the head was negative for anything acute.  At follow-up in October, he told me he had not had any more recurrent syncopal episodes.  He denied any chest pain at that time.  Today he presents for follow-up.  He noticed chronic, stable shortness of breath with exertion. Denies any chest pain,  palpitations, syncope, presyncope, dizziness, orthopnea, PND, swelling or significant weight changes, acute bleeding, or claudication.  Last seen by Dr. Alvan on March 03, 2024.  He was doing well at that time.  Since then, he was hospitalized in December 2025 due to COPD exacerbation.  This was also secondary to influenza, and also had mild CHF exacerbation.  Received IV Lasix .  CT of chest showed interval extensive ground glass interstitial opacity throughout the majority of both upper lobes.  Completed Tamiflu  and patient and completed antibiotics and treatment for COPD exacerbation.  2D echo revealed EF 50% approximately.  Today he is here for scheduled follow-up.  He states he is doing well from a cardiac perspective. Still continues to note symptoms of his COPD, phlegm and smoker's cough. According to his wife, he will need assistance with Jardiance . Follows Pulmonology. Denies any chest pain, palpitations, syncope, presyncope, dizziness, orthopnea, PND, swelling or significant weight changes, acute bleeding, or claudication.  EKGs/Labs/Other Studies Reviewed:   The following studies were reviewed today:  EKG: EKG is not ordered today.   Echo 07/2024:  1. Left ventricular ejection fraction, by estimation, is 50%. Left  ventricular ejection fraction by 3D volume is 51 %. The left ventricle has  normal function. Left ventricular endocardial border not optimally defined  to evaluate regional wall motion.  Left ventricular diastolic function could not be evaluated.   2. Right ventricular systolic function was not well visualized. The right  ventricular size is normal. There is normal pulmonary artery systolic  pressure.   3. Left  atrial size was mildly dilated.   4. The mitral valve is normal in structure. Mild mitral valve  regurgitation. No evidence of mitral stenosis.   5. The aortic valve is tricuspid. Aortic valve regurgitation is not  visualized. No aortic stenosis is present.    6. The inferior vena cava is normal in size with greater than 50%  respiratory variability, suggesting right atrial pressure of 3 mmHg.   Comparison(s): No significant change from prior study.  Cardiac monitor 06/2023:   14 day monitor   Afib throughout study (100% burden). Rates 41 to 131, average 67 bpm.   Rare ventricular ectopy in the form of isolated PVCs, couplets, triplets. 10 runs of NSVT longest 10.5 seconds   No reported symptoms     Patch Wear Time:  13 days and 23 hours (2024-09-23T14:39:43-0400 to 2024-10-07T14:39:35-0400)   10 Ventricular Tachycardia runs occurred, the run with the fastest interval lasting 5 beats with a max rate of 179 bpm, the longest lasting 10.5 secs with an avg rate of 124 bpm. Atrial Fibrillation occurred continuously (100% burden), ranging from  41-131 bpm (avg of 67 bpm). Isolated VEs were rare (<1.0%, 6304), VE Couplets were rare (<1.0%, 121), and VE Triplets were rare (<1.0%, 11).    Echo 05/2023: 1. Left ventricular ejection fraction, by estimation, is 50%. The left  ventricle has low normal function. The left ventricle demonstrates  regional wall motion abnormalities (see scoring diagram/findings for  description). Left ventricular diastolic  function could not be evaluated.   2. Right ventricular systolic function is normal. The right ventricular  size is normal. There is mildly elevated pulmonary artery systolic  pressure. The estimated right ventricular systolic pressure is 45.0 mmHg.   3. Left atrial size was mildly dilated.   4. The mitral valve is normal in structure. Mild mitral valve  regurgitation. No evidence of mitral stenosis.   5. The aortic valve was not well visualized. Aortic valve regurgitation  is not visualized. No aortic stenosis is present.   6. The inferior vena cava is dilated in size with >50% respiratory  variability, suggesting right atrial pressure of 8 mmHg.   Comparison(s): No significant change from prior  study. Prior images  reviewed side by side.  Carotid Doppler 05/2023: IMPRESSION: 1. Bilateral carotid bifurcation plaque resulting in less than 50% diameter ICA stenosis. 2. Antegrade bilateral vertebral arterial flow.   Echo 06/2022:  1. Left ventricular ejection fraction, by estimation, is 45 to 50%. The  left ventricle has mildly decreased function. The left ventricle  demonstrates regional wall motion abnormalities (see scoring  diagram/findings for description). There is mild  asymmetric left ventricular hypertrophy of the basal segment. Left  ventricular diastolic parameters are indeterminate.   2. Right ventricular systolic function is normal. The right ventricular  size is normal. There is normal pulmonary artery systolic pressure. The  estimated right ventricular systolic pressure is 24.3 mmHg.   3. Left atrial size was mild to moderately dilated.   4. The mitral valve is grossly normal. Mild to moderate mitral valve  regurgitation.   5. The aortic valve is tricuspid. Aortic valve regurgitation is not  visualized.   6. The inferior vena cava is normal in size with greater than 50%  respiratory variability, suggesting right atrial pressure of 3 mmHg.   Comparison(s): Prior images reviewed side by side. LVEF improved somewhat  at 45-50% with wall motion abnormalities consistent with ischemic  cardiomyopathy.  R/Left heart cath 03/2022:   Ost LAD to  Prox LAD lesion is 100% stenosed.   Mid LAD lesion is 100% stenosed.   Prox Cx lesion is 100% stenosed.   Prox RCA to Mid RCA lesion is 80% stenosed.   Mid RCA lesion is 100% stenosed.   Origin to Prox Graft lesion is 80% stenosed.   Origin to Prox Graft lesion is 100% stenosed.   A drug-eluting stent was successfully placed using a SYNERGY XD 4.0X12.   Post intervention, there is a 0% residual stenosis.   LIMA graft was visualized by angiography and is normal in caliber.   SVG graft was visualized by angiography and is  normal in caliber.   SVG graft was visualized by angiography and is normal in caliber.   SVG graft was visualized by angiography.   The graft exhibits no disease.   The graft exhibits no disease.   LV end diastolic pressure is mildly elevated.   Hemodynamic findings consistent with mild pulmonary hypertension.   3 vessel occlusive CAD Patent LIMA to the LAD Patent SVG to the first diagonal Patent SVG to OM1 with 80% focal stenosis in the proximal SVG Occluded SVG to RCA. The RCA fills by left to right collaterals.  Mildly elevated LV filling pressures.  Mild pulmonary HTN.  Normal cardiac output 4.51 L/min with index 2.53.  Successful PCI of the SVG to OM1 with DES   Plan: DAPT with ASA for one month and Plavix  for 12 months. Can resume oral anticoagulation in am if no bleeding noted.     Review of Systems    All other systems reviewed and are otherwise negative except as noted above.  Physical Exam    VS:  BP 122/70 (BP Location: Left Arm)   Pulse 75   Ht 5' 6 (1.676 m)   Wt 135 lb 6.4 oz (61.4 kg)   SpO2 100%   BMI 21.85 kg/m  , BMI Body mass index is 21.85 kg/m.  Wt Readings from Last 3 Encounters:  08/18/24 135 lb 6.4 oz (61.4 kg)  07/31/24 144 lb 13.5 oz (65.7 kg)  04/24/24 144 lb 12.8 oz (65.7 kg)     GEN: Well nourished, well developed, in no acute distress. HEENT: normal. Neck: Supple, no JVD, carotid bruits, or masses. Cardiac: S1/S2, irregularly irregular rhythm, no murmurs, rubs, or gallops. No clubbing, cyanosis, edema.  Radials/PT 2+ and equal bilaterally.  Respiratory:  Respirations regular and unlabored, clear to auscultation bilaterally. MS: No deformity or atrophy. Skin: Warm and dry, no rash. Neuro:  Strength and sensation are intact. Psych: Normal affect.  Assessment & Plan    Chronic HFimpEF, DOE Stage C, NYHA class II-III symptoms. Recent Echo revealed EF 50%. Weight stable. Euvolemic and well compensated on exam. Continue current GDMT.   Low sodium diet, fluid restriction <2L, and daily weights encouraged. Educated to contact our office for weight gain of 2 lbs overnight or 5 lbs in one week.   2. Hx of syncope Denies any recurrent episodes. Was in setting of dehydration. Monitor report showed sinus rhythm with 10 VT episodes, nonsustained with longest episode lasting around 11 seconds, patient asymptomatic during these episodes.  Remains in continuous A-fib, asymptomatic.   No medication changes at this time.  Encouraged him to stay well-hydrated. Continue to follow with PCP.  Echo overall benign, carotid Doppler revealed less than 50% plaque bilaterally. Orthostatics in hospital were negative.  Care and ED precautions discussed.  3. CAD, s/p MI in 32 and CABG Stable with no anginal symptoms. No  indication for ischemic evaluation.  Not on ASA d/t being on Coumadin . Continue current medication regimen. Heart healthy diet encouraged. ED precautions discussed.   4. HTN BP overall stable. Discussed to monitor BP at home at least 2 hours after medications and sitting for 5-10 minutes.  No medication changes at this time.  Avoid aggressive measures to lower BP due to past history of syncope.  Heart healthy diet encouraged.   5. HLD Continue atorvastatin . Heart healthy diet encouraged.   6. A-fib  Denies any tachycardia or palpitations. Continue Toprol  XL. Continue to follow-up at [facility], denies any bleeding issues on coumadin . Heart healthy diet encouraged.   7. COPD, pulmonary HTN Admits to stable shortness of breath. No medication changes at this time.  Etiology of pulmonary hypertension is most likely group 2/group 3.  Follow-up with Dr. Shelah as scheduled.    8. Tobacco abuse Smoking cessation encouraged and discussed.    Disposition: Will provide refills/assistance per patient request.  Follow up in 6 months with Alvan Carrier, MD or APP.  Signed, Almarie Crate, NP "

## 2024-08-18 NOTE — Progress Notes (Signed)
"   INR 6.0; Please see anticoagulation encounter  "

## 2024-08-21 ENCOUNTER — Other Ambulatory Visit (HOSPITAL_COMMUNITY): Payer: Self-pay

## 2024-08-21 ENCOUNTER — Encounter: Payer: Self-pay | Admitting: Emergency Medicine

## 2024-08-21 ENCOUNTER — Ambulatory Visit: Admitting: Emergency Medicine

## 2024-08-21 VITALS — BP 120/86 | HR 62 | Ht 66.0 in | Wt 135.0 lb

## 2024-08-21 DIAGNOSIS — F17210 Nicotine dependence, cigarettes, uncomplicated: Secondary | ICD-10-CM | POA: Diagnosis not present

## 2024-08-21 DIAGNOSIS — J9601 Acute respiratory failure with hypoxia: Secondary | ICD-10-CM | POA: Diagnosis not present

## 2024-08-21 DIAGNOSIS — J111 Influenza due to unidentified influenza virus with other respiratory manifestations: Secondary | ICD-10-CM

## 2024-08-21 DIAGNOSIS — J449 Chronic obstructive pulmonary disease, unspecified: Secondary | ICD-10-CM

## 2024-08-21 DIAGNOSIS — J441 Chronic obstructive pulmonary disease with (acute) exacerbation: Secondary | ICD-10-CM | POA: Diagnosis not present

## 2024-08-21 DIAGNOSIS — R918 Other nonspecific abnormal finding of lung field: Secondary | ICD-10-CM | POA: Diagnosis not present

## 2024-08-21 DIAGNOSIS — J189 Pneumonia, unspecified organism: Secondary | ICD-10-CM

## 2024-08-21 DIAGNOSIS — Z720 Tobacco use: Secondary | ICD-10-CM

## 2024-08-21 NOTE — Assessment & Plan Note (Signed)
 Repeat CT chest in March to follow for stability of his pulmonary nodules, resolution of his infiltrates.  If stable then we can space his follow-up back out again

## 2024-08-21 NOTE — Assessment & Plan Note (Signed)
 Admitted with influenza and acute exacerbation of his COPD, bilateral upper lobe infiltrates consistent with influenza pneumonia versus possible superimposed bacterial pneumonia versus possible component of volume overload.  Plan to repeat his CT scan of the chest in March to look for interval resolution of his infiltrates

## 2024-08-21 NOTE — Progress Notes (Signed)
 "  Subjective:    [name], male    DOB: [birth year], 79 y.o.   MRN: [id number]  HPI  ROV 10/04/2023 --79 year old gentleman with a history of active tobacco use and associated emphysematous COPD.  He has mild obstruction on PFT.  Also with a history of CAD/CABG, hypertension, A-fib/flutter, prostate cancer, bladder cancer.  He participates in lung cancer screening program and had his CT chest done 04/2023. Currently managed on Breztri  He can get exertional SOB with walking to his trash cans - about 50 ft. He will often use his albuterol  with this exertion. He has a morning cough, clears tan phlegm. No hemoptysis. Rare wheeze. No flares since I last saw him. Smokes 10-20 cig a day. Flu shot is up to date, did not get COVID or RSV.    Lung cancer screening CT chest 04/13/2023 reviewed by me shows biapical pleural-parenchymal scarring and centrilobular emphysema, respiratory bronchiolitis with some scattered nodules all less than 3 mm in size, no new pulmonary nodules.  Stable.  RADS 2 study  Acute office visit / Hospital follow up 08/21/2024 --79 year old man with a history of active tobacco use (50 pack years), emphysematous COPD.  He also has CAD/CABG, hypertension, A-fib/flutter, history of bladder and prostate cancer.  He presents today after being diagnosed about 3 weeks ago with influenza, was hospitalized at [facility]. Treated for an AE-COPD and AE-CHF, received Tamiflu . His subsequent TTE was reassuring. He has completed prednisone . He is smoking about 2-3 cig a day. He is on Breztri . Using albuterol  about every other day.  Reports that he feels much better but still dizzy when walking, weak w walking. No real cough currently. SOB w most exertion.   CT scan of the chest 08/04/2024 reviewed by me showed no mediastinal or hilar adenopathy, extensive upper lobe and minimal right middle lobe ground glass interstitial opacity bilaterally superimposed on his emphysema consistent with an  infectious process.  Bibasilar patchy lower lobe densities.    Vitals:   08/21/24 1552  BP: 120/86  Pulse: 62  Height: 5' 6 (1.676 m) Comment: per pt  Weight: 135 lb (61.2 kg)  SpO2: 97%  BMI (Calculated): 21.8    Walking oximetry performed today: SATURATION QUALIFICATIONS: (This note is used to comply with regulatory documentation for home oxygen)  Patient Saturations on Room Air at Rest = 97%  Patient Saturations on Room Air while Ambulating = 87%  Patient Saturations on 2 Liters of oxygen while Ambulating = 96%  Please briefly explain why patient needs home oxygen:patient sats dropped to 87% with in the 2nd lap placed on 2L O2 sats @ 96%   Review of Systems As per HPI     Objective:   Physical Exam  Vitals:   08/21/24 1552  BP: 120/86  Pulse: 62  SpO2: 97%  Weight: 135 lb (61.2 kg)  Height: 5' 6 (1.676 m)   Gen: Pleasant, well-nourished, in no distress,  normal affect  ENT: No lesions,  mouth clear,  oropharynx clear, no postnasal drip  Neck: No JVD, no stridor  Lungs: No use of accessory muscles, no crackles or wheezing on normal respiration, he has cough and some mild wheeze on forced expiration  Cardiovascular: RRR, heart sounds normal, no murmur or gallops, no peripheral edema  Musculoskeletal: No deformities, no cyanosis or clubbing  Neuro: alert, awake, non focal  Skin: Warm, no lesions or rash     Assessment & Plan:  Influenza Admitted with influenza and acute exacerbation  of his COPD, bilateral upper lobe infiltrates consistent with influenza pneumonia versus possible superimposed bacterial pneumonia versus possible component of volume overload.  Plan to repeat his CT scan of the chest in March to look for interval resolution of his infiltrates  Pulmonary nodules Repeat CT chest in March to follow for stability of his pulmonary nodules, resolution of his infiltrates.  If stable then we can space his follow-up back out again  COPD (chronic  obstructive pulmonary disease) (HCC) With a significant exacerbation.  Plan to continue his Trelegy.  Will give him an albuterol  nebulizer so he can use this on an as needed basis.  He will keep his HFA available also  Tobacco abuse Discussed smoking cessation.  He is smoking 2-3 cigarettes daily.  Did encourage him that he is in the position where he could consider setting a quit date if he is ready to do so.  Acute respiratory failure with hypoxia (HCC) Walking oximetry performed today.  The patient qualified for supplemental oxygen.  Plan to start 2 L/min continuous.  Hopefully we can walk him next time and see if he can qualify for POC.   SATURATION QUALIFICATIONS: (This note is used to comply with regulatory documentation for home oxygen)  Patient Saturations on Room Air at Rest = 97%  Patient Saturations on Room Air while Ambulating = 87%  Patient Saturations on 2 Liters of oxygen while Ambulating = 96%  Please briefly explain why patient needs home oxygen:patient sats dropped to 87% with in the 2nd lap placed on 2L O2 sats @ 96%    I personally spent a total of 60 minutes in the care of the patient today including preparing to see the patient, getting/reviewing separately obtained history, performing a medically appropriate exam/evaluation, counseling and educating, placing orders, documenting clinical information in the EHR, independently interpreting results, and communicating results.   Lamar Chris, MD, PhD 08/21/2024, 4:58 PM [facility] [phone] or if no answer [phone]  "

## 2024-08-21 NOTE — Patient Instructions (Addendum)
 We will plan to repeat your CT scan of the chest at [facility] in March 2026 to compare with priors. Please continue your Breztri  2 puffs twice a day.  Rinse and gargle after using. Keep your albuterol  available to use 2 puffs or 1 nebulizer treatment up to every 4 hours if needed for shortness of breath, chest tightness, wheezing.  We will order a nebulizer machine and albuterol  nebulizer solution for you today. We will perform a walking oximetry on room air to see if you qualify for supplemental oxygen You need to work hard on stopping smoking. Follow with Dr. Shelah in March so we can review your CT chest.  Call sooner if you have any problems.

## 2024-08-21 NOTE — Assessment & Plan Note (Signed)
 Walking oximetry performed today.  The patient qualified for supplemental oxygen.  Plan to start 2 L/min continuous.  Hopefully we can walk him next time and see if he can qualify for POC.   SATURATION QUALIFICATIONS: (This note is used to comply with regulatory documentation for home oxygen)  Patient Saturations on Room Air at Rest = 97%  Patient Saturations on Room Air while Ambulating = 87%  Patient Saturations on 2 Liters of oxygen while Ambulating = 96%  Please briefly explain why patient needs home oxygen:patient sats dropped to 87% with in the 2nd lap placed on 2L O2 sats @ 96%

## 2024-08-21 NOTE — Assessment & Plan Note (Signed)
 Discussed smoking cessation.  He is smoking 2-3 cigarettes daily.  Did encourage him that he is in the position where he could consider setting a quit date if he is ready to do so.

## 2024-08-21 NOTE — Assessment & Plan Note (Signed)
 With a significant exacerbation.  Plan to continue his Trelegy.  Will give him an albuterol  nebulizer so he can use this on an as needed basis.  He will keep his HFA available also

## 2024-08-22 ENCOUNTER — Inpatient Hospital Stay: Admitting: Family Medicine

## 2024-08-22 ENCOUNTER — Ambulatory Visit: Payer: Self-pay | Admitting: Family Medicine

## 2024-08-25 ENCOUNTER — Telehealth: Payer: Self-pay

## 2024-08-25 ENCOUNTER — Ambulatory Visit: Attending: Cardiology | Admitting: *Deleted

## 2024-08-25 DIAGNOSIS — Z5181 Encounter for therapeutic drug level monitoring: Secondary | ICD-10-CM | POA: Diagnosis not present

## 2024-08-25 DIAGNOSIS — I4891 Unspecified atrial fibrillation: Secondary | ICD-10-CM

## 2024-08-25 LAB — POCT INR: INR: 2.1 (ref 2.0–3.0)

## 2024-08-25 NOTE — Telephone Encounter (Signed)
 Copied from CRM #[id number]. Topic: Clinical - Prescription Issue >> Aug 25, 2024 11:07 AM Corean SAUNDERS wrote: Reason for CRM: Patients wife Mateo) seeking assistance as patient picked up nebulizer machine from [facility] but was advised they didn't have an order for the albuterol  nebulizer solution but there has not been an order sent to patients pharmacy either.  Please call patient back to advise.    [facility] has fix the issues they do apologize , spoke with patient VBU.   -NFN

## 2024-08-25 NOTE — Progress Notes (Signed)
 INR 2.1; Please see anticoagulation encounter

## 2024-08-25 NOTE — Patient Instructions (Signed)
 Continue warfarin 1/2 tablet daily except 1 tablet on Tuesdays, Thursdays and Saturdays Recheck in 3 week.  Call with any new medications.

## 2024-08-27 ENCOUNTER — Encounter: Payer: Self-pay | Admitting: Physician Assistant

## 2024-08-27 ENCOUNTER — Ambulatory Visit: Admitting: Physician Assistant

## 2024-08-27 VITALS — BP 92/69

## 2024-08-27 DIAGNOSIS — Z1283 Encounter for screening for malignant neoplasm of skin: Secondary | ICD-10-CM | POA: Diagnosis not present

## 2024-08-27 DIAGNOSIS — W908XXA Exposure to other nonionizing radiation, initial encounter: Secondary | ICD-10-CM | POA: Diagnosis not present

## 2024-08-27 DIAGNOSIS — L814 Other melanin hyperpigmentation: Secondary | ICD-10-CM | POA: Diagnosis not present

## 2024-08-27 DIAGNOSIS — L821 Other seborrheic keratosis: Secondary | ICD-10-CM

## 2024-08-27 DIAGNOSIS — L578 Other skin changes due to chronic exposure to nonionizing radiation: Secondary | ICD-10-CM

## 2024-08-27 DIAGNOSIS — D1801 Hemangioma of skin and subcutaneous tissue: Secondary | ICD-10-CM

## 2024-08-27 DIAGNOSIS — Z86006 Personal history of melanoma in-situ: Secondary | ICD-10-CM

## 2024-08-27 DIAGNOSIS — Z85820 Personal history of malignant melanoma of skin: Secondary | ICD-10-CM | POA: Diagnosis not present

## 2024-08-27 DIAGNOSIS — C4492 Squamous cell carcinoma of skin, unspecified: Secondary | ICD-10-CM

## 2024-08-27 DIAGNOSIS — D229 Melanocytic nevi, unspecified: Secondary | ICD-10-CM

## 2024-08-27 NOTE — Patient Instructions (Signed)

## 2024-08-27 NOTE — Progress Notes (Signed)
" ° °  Follow-Up Visit   Subjective  [name] is a 79 y.o. male ESTABLISHED PATIENT who presents for the following: Skin Cancer Screening and Full Body Skin Exam  Patient has undergone Mohs surgery x 2 for malignant melanoma of right neck and right lower back.   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 The plan is for 5-FU injection.    The following portions of the chart were reviewed this encounter and updated as appropriate: medications, allergies, medical history  Review of Systems:  No other skin or systemic complaints except as noted in HPI or Assessment and Plan.  Objective  Well appearing patient in no apparent distress; mood and affect are within normal limits.  A full examination was performed including scalp, head, eyes, ears, nose, lips, neck, chest, axillae, abdomen, back, buttocks, bilateral upper extremities, bilateral lower extremities, hands, feet, fingers, toes, fingernails, and toenails. All findings within normal limits unless otherwise noted below.   NO PALPABLE CERVICAL, AXILLARY OR INGUINAL LYMPHADENOPATHY.   Relevant physical exam findings are noted in the Assessment and Plan.    Assessment & Plan   SKIN CANCER SCREENING PERFORMED TODAY.  ACTINIC DAMAGE - Chronic condition, secondary to cumulative UV/sun exposure - diffuse scaly erythematous macules with underlying dyspigmentation - Recommend daily broad spectrum sunscreen SPF 30+ to sun-exposed areas, reapply every 2 hours as needed.  - Staying in the shade or wearing long sleeves, sun glasses (UVA+UVB protection) and wide brim hats (4-inch brim around the entire circumference of the hat) are also recommended for sun protection.  - Call for new or changing lesions.  LENTIGINES, SEBORRHEIC KERATOSES, HEMANGIOMAS - Benign normal skin lesions - Benign-appearing - Call for any changes  MELANOCYTIC NEVI - Tan-brown and/or pink-flesh-colored symmetric macules and  papules - Benign appearing on exam today - Observation - Call clinic for new or changing moles - Recommend daily use of broad spectrum spf 30+ sunscreen to sun-exposed areas.   HISTORY OF MELANOMA IN SITU OF RIGHT LOWER BACK - No evidence of recurrence today - Recommend regular full body skin exams - Recommend daily broad spectrum sunscreen SPF 30+ to sun-exposed areas, reapply every 2 hours as needed.  - Call if any new or changing lesions are noted between office visits  HISTORY OF MELANOMA OF RIGHT ANT NECK - No evidence of recurrence today - No lymphadenopathy - Recommend regular full body skin exams - Recommend daily broad spectrum sunscreen SPF 30+ to sun-exposed areas, reapply every 2 hours as needed.  - Call if any new or changing lesions are noted between office visits  ATYPICAL SQUAMOUS PROLIFERATION OF RIGHT AND LEFT FOREARM, LEFT LATERAL LOWER LEG Exam: No evidence of skin cancer today  Treatment Plan: We will re-evaluate areas on follow up.   MULTIPLE BENIGN NEVI   CHERRY ANGIOMA   SEBORRHEIC KERATOSIS   LENTIGINES   ACTINIC SKIN DAMAGE   SCREENING EXAM FOR SKIN CANCER   PERSONAL HISTORY OF MALIGNANT MELANOMA OF SKIN   SQUAMOUS CELL CARCINOMA OF SKIN   Return in about 3 months (around 11/25/2024) for TBSE.  I, Roseline Hutchinson, CMA, am acting as scribe for Vibha Ferdig K, PA-C .   Documentation: I have reviewed the above documentation for accuracy and completeness, and I agree with the above.  Lainy Wrobleski K, PA-C    "

## 2024-08-29 MED ORDER — ALBUTEROL SULFATE (2.5 MG/3ML) 0.083% IN NEBU: 2.5000 mg | INHALATION_SOLUTION | Freq: Four times a day (QID) | RESPIRATORY_TRACT | 12 refills | Status: DC | PRN

## 2024-08-29 NOTE — Telephone Encounter (Signed)
 Copied from CRM #[id number]. Topic: Clinical - Prescription Issue >> Aug 28, 2024  5:02 PM Rilla B wrote: Reason for CRM: Pharmacy with [facility] calling regarding albuterol  solution.  States script is incomplete.  Need quantity and frequency.  FAX: [phone]   I have added rx and sent to pharmacy as well.  Rx for neb solution printed and faxed to the fax number above with directions.  NFN

## 2024-09-03 ENCOUNTER — Telehealth: Payer: Self-pay

## 2024-09-03 ENCOUNTER — Ambulatory Visit: Admitting: Dermatology

## 2024-09-03 ENCOUNTER — Other Ambulatory Visit: Payer: Self-pay

## 2024-09-03 MED ORDER — ALBUTEROL SULFATE (2.5 MG/3ML) 0.083% IN NEBU: 2.5000 mg | INHALATION_SOLUTION | Freq: Four times a day (QID) | RESPIRATORY_TRACT | 12 refills | Status: DC | PRN

## 2024-09-03 NOTE — Telephone Encounter (Signed)
 Copied from CRM #[id number]. Topic: Clinical - Prescription Issue >> Sep 02, 2024  4:46 PM Rilla B wrote: Reason for CRM: Alecia from regarding Albuterol  prescription... need quantity greater 90 ml or more.   FAX [phone]   Phone [phone]   Rx updated    - NFN

## 2024-09-05 ENCOUNTER — Ambulatory Visit: Admitting: Dermatology

## 2024-09-05 ENCOUNTER — Encounter: Payer: Self-pay | Admitting: Dermatology

## 2024-09-05 VITALS — BP 107/74

## 2024-09-05 DIAGNOSIS — Z08 Encounter for follow-up examination after completed treatment for malignant neoplasm: Secondary | ICD-10-CM | POA: Diagnosis not present

## 2024-09-05 DIAGNOSIS — C44622 Squamous cell carcinoma of skin of right upper limb, including shoulder: Secondary | ICD-10-CM | POA: Diagnosis not present

## 2024-09-05 DIAGNOSIS — C4492 Squamous cell carcinoma of skin, unspecified: Secondary | ICD-10-CM

## 2024-09-05 DIAGNOSIS — C44729 Squamous cell carcinoma of skin of left lower limb, including hip: Secondary | ICD-10-CM

## 2024-09-05 DIAGNOSIS — Z85820 Personal history of malignant melanoma of skin: Secondary | ICD-10-CM

## 2024-09-05 DIAGNOSIS — I4819 Other persistent atrial fibrillation: Secondary | ICD-10-CM

## 2024-09-05 DIAGNOSIS — C44629 Squamous cell carcinoma of skin of left upper limb, including shoulder: Secondary | ICD-10-CM | POA: Diagnosis not present

## 2024-09-05 MED ORDER — FLUOROURACIL CHEMO INJECTION 500 MG/10ML FOR DERMATOLOGY USE
50.0000 mg | Freq: Once | INTRAVENOUS | Status: AC
  Administered 2024-09-05: 50 mg via INTRALESIONAL

## 2024-09-05 NOTE — Patient Instructions (Signed)

## 2024-09-05 NOTE — Progress Notes (Signed)
" ° °  Follow-Up Visit   History of Present Illness [name] is a 79 year old male who presents for follow-up of biopsy-proven potential squamous cell lesions.  He has a history of potential squamous cell lesions that were biopsied in October, located on the forearm, right side, and left leg. The biopsy results indicated potential squamous cells.  The lesions were measured as follows: lesion A is 2.5 by 2 cm, lesion B is 2 by 1.3 cm, and lesion C is 1 cm in one dimension.  He describes the injection as feeling like a 'little burn' but 'not too bad'.  Patient present today for follow up visit for atypical squamous proliferation with cystic change located at the Right Mid Forearm, Left proximal Forearm & Left lateral Lower leg. Patient had bx completed on 05/21/2024 by Erminio Like, PA-C. Patient reports sxs are unchanged. Patient denies medication changes.  The following portions of the chart were reviewed this encounter and updated as appropriate: medications, allergies, medical history  Review of Systems:  No other skin or systemic complaints except as noted in HPI or Assessment and Plan.  Objective  Well appearing patient in no apparent distress; mood and affect are within normal limits.  A focused examination was performed of the following areas: Right Mid Forearm, Left proximal Forearm & Left lateral Lower leg  Relevant exam findings are noted in the Assessment and Plan. 107/74 A: 2.5 x 2 - Injected 1 cc  B: 2 x 1.3 - Injected 0.8 ml C: 1 cm - injected 0.6 ml Right Forearm - Posterior Pink scaly plaques Left Forearm - Posterior Pink scaly plaques Left Lower Leg - Anterior Pink scaly plaques  Assessment & Plan   HISTORY OF MELANOMA (Right Lower Back MOHS Completed on 07/01/2024) - No evidence of recurrence today - No lymphadenopathy - Recommend regular full body skin exams - Recommend daily broad spectrum sunscreen SPF 30+ to sun-exposed areas, reapply every 2 hours  as needed.  - Call if any new or changing lesions are noted between office visits   Atypical squamous proliferation with cystic change Exam: pink scaly plaques/papules  Treatment Plan: - 5FU Injections Completed while in office today  - Plan to follow up in 1 months to re-assess SCC (SQUAMOUS CELL CARCINOMA) (3) Right Forearm - Posterior - fluorouracil  (ADRUCIL ) chemo injection 50 mg Left Forearm - Posterior - fluorouracil  (ADRUCIL ) chemo injection 50 mg Left Lower Leg - Anterior - fluorouracil  (ADRUCIL ) chemo injection 50 mg PERSONAL HISTORY OF MALIGNANT MELANOMA OF SKIN    Return in about 4 weeks (around 10/03/2024).  I, Jetta Ager, am acting as scribe for RUFUS CHRISTELLA HOLY, MD.  Documentation: I have reviewed the above documentation for accuracy and completeness, and I agree with the above.  RUFUS CHRISTELLA HOLY, MD   "

## 2024-09-07 ENCOUNTER — Other Ambulatory Visit: Payer: Self-pay | Admitting: Family Medicine

## 2024-09-08 ENCOUNTER — Other Ambulatory Visit (HOSPITAL_BASED_OUTPATIENT_CLINIC_OR_DEPARTMENT_OTHER): Payer: Self-pay

## 2024-09-08 ENCOUNTER — Telehealth (HOSPITAL_BASED_OUTPATIENT_CLINIC_OR_DEPARTMENT_OTHER): Payer: Self-pay

## 2024-09-08 MED ORDER — ALBUTEROL SULFATE (2.5 MG/3ML) 0.083% IN NEBU: 2.5000 mg | INHALATION_SOLUTION | Freq: Four times a day (QID) | RESPIRATORY_TRACT | 12 refills | Status: AC | PRN

## 2024-09-08 NOTE — Telephone Encounter (Signed)
" °  Rx sent to [facility] by [facility] with Dx code NFN   Copied from CRM [id number]. Topic: Clinical - Prescription Issue >> Sep 08, 2024  3:06 PM Dedra B wrote: Reason for CRM: Russell from [facility] [place] they received information for albuterol  (PROVENTIL ) (2.5 MG/3ML) 0.083% nebulizer solution every 6 hours as needed but not an actual prescription. "

## 2024-09-09 ENCOUNTER — Telehealth: Payer: Self-pay | Admitting: Cardiology

## 2024-09-09 NOTE — Telephone Encounter (Signed)
 Patient spouse is calling to see if the application to get the medication for free was sent   empagliflozin  (JARDIANCE ) 10 MG TABS tablet

## 2024-09-10 ENCOUNTER — Telehealth: Payer: Self-pay | Admitting: Pharmacy Technician

## 2024-09-10 ENCOUNTER — Other Ambulatory Visit (HOSPITAL_COMMUNITY): Payer: Self-pay

## 2024-09-10 DIAGNOSIS — I5020 Unspecified systolic (congestive) heart failure: Secondary | ICD-10-CM

## 2024-09-10 MED ORDER — EMPAGLIFLOZIN 10 MG PO TABS: 10.0000 mg | ORAL_TABLET | Freq: Every day | ORAL | 1 refills | Status: AC

## 2024-09-10 NOTE — Telephone Encounter (Signed)
 Patient Advocate Encounter   The patient was approved for a [employer] grant that will help cover the cost of Jardiance / Metoprolol  Total amount awarded, 7500.00.  Effective: 08/11/24 - 08/10/25   APW:[id number] ERW:[id number] Hmnle:[id number] PI:[id number]  [employer] ID: [id number]   Pharmacy provided with approval and processing information. Patient informed via mychart

## 2024-09-10 NOTE — Addendum Note (Signed)
 Addended by: DARRELL BAREFOOT on: 09/10/2024 03:14 PM   Modules accepted: Orders

## 2024-09-15 ENCOUNTER — Ambulatory Visit

## 2024-10-08 ENCOUNTER — Ambulatory Visit: Admitting: Dermatology

## 2024-10-22 ENCOUNTER — Ambulatory Visit: Payer: Self-pay | Admitting: Family Medicine

## 2024-10-23 ENCOUNTER — Ambulatory Visit: Admitting: Emergency Medicine

## 2024-12-16 ENCOUNTER — Ambulatory Visit: Admitting: Physician Assistant
